# Patient Record
Sex: Female | Born: 1935
Health system: Southern US, Community
[De-identification: ages and names within clinical notes are randomized; demographics above are authoritative.]

## PROBLEM LIST (undated history)

## (undated) DIAGNOSIS — R0989 Other specified symptoms and signs involving the circulatory and respiratory systems: Secondary | ICD-10-CM

## (undated) DIAGNOSIS — K648 Other hemorrhoids: Secondary | ICD-10-CM

## (undated) DIAGNOSIS — D649 Anemia, unspecified: Secondary | ICD-10-CM

## (undated) DIAGNOSIS — I10 Essential (primary) hypertension: Secondary | ICD-10-CM

## (undated) DIAGNOSIS — R0609 Other forms of dyspnea: Secondary | ICD-10-CM

## (undated) DIAGNOSIS — I350 Nonrheumatic aortic (valve) stenosis: Secondary | ICD-10-CM

## (undated) DIAGNOSIS — R112 Nausea with vomiting, unspecified: Secondary | ICD-10-CM

## (undated) DIAGNOSIS — I519 Heart disease, unspecified: Secondary | ICD-10-CM

## (undated) DIAGNOSIS — I6529 Occlusion and stenosis of unspecified carotid artery: Secondary | ICD-10-CM

## (undated) DIAGNOSIS — Z9289 Personal history of other medical treatment: Secondary | ICD-10-CM

## (undated) DIAGNOSIS — Z9889 Other specified postprocedural states: Secondary | ICD-10-CM

## (undated) DIAGNOSIS — R011 Cardiac murmur, unspecified: Secondary | ICD-10-CM

## (undated) DIAGNOSIS — K8020 Calculus of gallbladder without cholecystitis without obstruction: Secondary | ICD-10-CM

## (undated) DIAGNOSIS — I351 Nonrheumatic aortic (valve) insufficiency: Secondary | ICD-10-CM

## (undated) DIAGNOSIS — M159 Polyosteoarthritis, unspecified: Secondary | ICD-10-CM

## (undated) DIAGNOSIS — T8859XA Other complications of anesthesia, initial encounter: Secondary | ICD-10-CM

## (undated) DIAGNOSIS — I739 Peripheral vascular disease, unspecified: Secondary | ICD-10-CM

## (undated) DIAGNOSIS — M542 Cervicalgia: Secondary | ICD-10-CM

## (undated) DIAGNOSIS — T4145XA Adverse effect of unspecified anesthetic, initial encounter: Secondary | ICD-10-CM

## (undated) DIAGNOSIS — R609 Edema, unspecified: Secondary | ICD-10-CM

## (undated) DIAGNOSIS — K219 Gastro-esophageal reflux disease without esophagitis: Secondary | ICD-10-CM

## (undated) DIAGNOSIS — R911 Solitary pulmonary nodule: Secondary | ICD-10-CM

## (undated) HISTORY — PX: HEMORRHOID SURGERY: SHX153

## (undated) HISTORY — DX: Other hemorrhoids: K64.8

## (undated) HISTORY — PX: TOTAL KNEE ARTHROPLASTY: SHX125

## (undated) HISTORY — DX: Peripheral vascular disease, unspecified: I73.9

## (undated) HISTORY — DX: Calculus of gallbladder without cholecystitis without obstruction: K80.20

## (undated) HISTORY — DX: Polyosteoarthritis, unspecified: M15.9

## (undated) HISTORY — PX: ABDOMINAL HYSTERECTOMY: SHX81

## (undated) HISTORY — DX: Essential (primary) hypertension: I10

## (undated) HISTORY — PX: SHOULDER ARTHROSCOPY: SHX128

## (undated) HISTORY — DX: Anemia, unspecified: D64.9

## (undated) HISTORY — DX: Other forms of dyspnea: R06.09

## (undated) HISTORY — PX: HAMMER TOE SURGERY: SHX385

## (undated) HISTORY — PX: OTHER SURGICAL HISTORY: SHX169

## (undated) HISTORY — PX: BUNIONECTOMY: SHX129

## (undated) HISTORY — PX: CHOLECYSTECTOMY: SHX55

## (undated) HISTORY — DX: Other specified symptoms and signs involving the circulatory and respiratory systems: R09.89

## (undated) HISTORY — DX: Edema, unspecified: R60.9

## (undated) HISTORY — DX: Cervicalgia: M54.2

## (undated) HISTORY — PX: CARPAL TUNNEL RELEASE: SHX101

## (undated) HISTORY — DX: Occlusion and stenosis of unspecified carotid artery: I65.29

## (undated) HISTORY — PX: OOPHORECTOMY: SHX86

---
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
## 1999-02-15 ENCOUNTER — Ambulatory Visit (HOSPITAL_COMMUNITY): Admission: RE | Admit: 1999-02-15 | Discharge: 1999-02-15 | Payer: Self-pay | Admitting: General Surgery

## 1999-03-14 ENCOUNTER — Ambulatory Visit (HOSPITAL_COMMUNITY): Admission: RE | Admit: 1999-03-14 | Discharge: 1999-03-14 | Payer: Self-pay | Admitting: Obstetrics and Gynecology

## 1999-07-16 ENCOUNTER — Encounter: Payer: Self-pay | Admitting: Orthopaedic Surgery

## 1999-07-16 ENCOUNTER — Ambulatory Visit (HOSPITAL_COMMUNITY): Admission: RE | Admit: 1999-07-16 | Discharge: 1999-07-16 | Payer: Self-pay | Admitting: Orthopaedic Surgery

## 1999-08-27 ENCOUNTER — Ambulatory Visit (HOSPITAL_COMMUNITY): Admission: RE | Admit: 1999-08-27 | Discharge: 1999-08-27 | Payer: Self-pay | Admitting: Orthopaedic Surgery

## 1999-08-27 ENCOUNTER — Encounter: Payer: Self-pay | Admitting: Orthopaedic Surgery

## 1999-11-20 ENCOUNTER — Ambulatory Visit (HOSPITAL_COMMUNITY): Admission: RE | Admit: 1999-11-20 | Discharge: 1999-11-20 | Payer: Self-pay | Admitting: Obstetrics and Gynecology

## 1999-11-20 ENCOUNTER — Encounter (INDEPENDENT_AMBULATORY_CARE_PROVIDER_SITE_OTHER): Payer: Self-pay

## 2000-01-31 ENCOUNTER — Other Ambulatory Visit: Admission: RE | Admit: 2000-01-31 | Discharge: 2000-01-31 | Payer: Self-pay | Admitting: Gynecology

## 2000-02-21 ENCOUNTER — Encounter: Payer: Self-pay | Admitting: Family Medicine

## 2000-02-21 ENCOUNTER — Encounter: Admission: RE | Admit: 2000-02-21 | Discharge: 2000-02-21 | Payer: Self-pay | Admitting: Family Medicine

## 2000-02-27 ENCOUNTER — Encounter: Payer: Self-pay | Admitting: Family Medicine

## 2000-02-27 ENCOUNTER — Encounter: Admission: RE | Admit: 2000-02-27 | Discharge: 2000-02-27 | Payer: Self-pay | Admitting: Family Medicine

## 2000-03-10 ENCOUNTER — Encounter (INDEPENDENT_AMBULATORY_CARE_PROVIDER_SITE_OTHER): Payer: Self-pay

## 2000-03-10 ENCOUNTER — Inpatient Hospital Stay (HOSPITAL_COMMUNITY): Admission: RE | Admit: 2000-03-10 | Discharge: 2000-03-12 | Payer: Self-pay | Admitting: Gynecology

## 2000-11-03 ENCOUNTER — Other Ambulatory Visit: Admission: RE | Admit: 2000-11-03 | Discharge: 2000-11-03 | Payer: Self-pay | Admitting: Gynecology

## 2000-11-04 ENCOUNTER — Encounter: Admission: RE | Admit: 2000-11-04 | Discharge: 2000-11-04 | Payer: Self-pay | Admitting: Gynecology

## 2000-11-04 ENCOUNTER — Encounter: Payer: Self-pay | Admitting: Gynecology

## 2001-03-04 ENCOUNTER — Encounter: Admission: RE | Admit: 2001-03-04 | Discharge: 2001-03-04 | Payer: Self-pay | Admitting: Gynecology

## 2001-03-04 ENCOUNTER — Encounter: Payer: Self-pay | Admitting: Gynecology

## 2001-11-05 ENCOUNTER — Other Ambulatory Visit: Admission: RE | Admit: 2001-11-05 | Discharge: 2001-11-05 | Payer: Self-pay | Admitting: Gynecology

## 2002-03-09 ENCOUNTER — Encounter: Payer: Self-pay | Admitting: Gynecology

## 2002-03-09 ENCOUNTER — Encounter: Admission: RE | Admit: 2002-03-09 | Discharge: 2002-03-09 | Payer: Self-pay | Admitting: Gynecology

## 2002-11-25 HISTORY — PX: ROTATOR CUFF REPAIR: SHX139

## 2002-12-01 ENCOUNTER — Other Ambulatory Visit: Admission: RE | Admit: 2002-12-01 | Discharge: 2002-12-01 | Payer: Self-pay | Admitting: Gynecology

## 2003-01-07 ENCOUNTER — Encounter: Payer: Self-pay | Admitting: Family Medicine

## 2003-01-07 ENCOUNTER — Encounter: Admission: RE | Admit: 2003-01-07 | Discharge: 2003-01-07 | Payer: Self-pay | Admitting: Family Medicine

## 2003-05-10 ENCOUNTER — Encounter: Payer: Self-pay | Admitting: Gynecology

## 2003-05-10 ENCOUNTER — Encounter: Admission: RE | Admit: 2003-05-10 | Discharge: 2003-05-10 | Payer: Self-pay | Admitting: Gynecology

## 2003-07-13 ENCOUNTER — Encounter: Payer: Self-pay | Admitting: Family Medicine

## 2003-07-13 ENCOUNTER — Encounter: Admission: RE | Admit: 2003-07-13 | Discharge: 2003-07-13 | Payer: Self-pay | Admitting: Family Medicine

## 2003-07-20 ENCOUNTER — Ambulatory Visit (HOSPITAL_BASED_OUTPATIENT_CLINIC_OR_DEPARTMENT_OTHER): Admission: RE | Admit: 2003-07-20 | Discharge: 2003-07-21 | Payer: Self-pay | Admitting: Orthopaedic Surgery

## 2003-11-01 ENCOUNTER — Encounter: Admission: RE | Admit: 2003-11-01 | Discharge: 2003-11-01 | Payer: Self-pay | Admitting: Orthopaedic Surgery

## 2004-11-25 LAB — HM COLONOSCOPY

## 2005-01-03 ENCOUNTER — Other Ambulatory Visit: Admission: RE | Admit: 2005-01-03 | Discharge: 2005-01-03 | Payer: Self-pay | Admitting: Gynecology

## 2005-11-25 HISTORY — PX: OTHER SURGICAL HISTORY: SHX169

## 2006-01-06 ENCOUNTER — Other Ambulatory Visit: Admission: RE | Admit: 2006-01-06 | Discharge: 2006-01-06 | Payer: Self-pay | Admitting: Gynecology

## 2006-02-06 ENCOUNTER — Encounter: Payer: Self-pay | Admitting: Internal Medicine

## 2006-04-01 ENCOUNTER — Ambulatory Visit: Payer: Self-pay | Admitting: *Deleted

## 2006-04-17 ENCOUNTER — Ambulatory Visit: Payer: Self-pay | Admitting: *Deleted

## 2006-04-17 ENCOUNTER — Ambulatory Visit: Payer: Self-pay

## 2006-04-17 ENCOUNTER — Encounter: Payer: Self-pay | Admitting: Internal Medicine

## 2006-04-25 ENCOUNTER — Ambulatory Visit: Payer: Self-pay | Admitting: Internal Medicine

## 2006-06-09 ENCOUNTER — Ambulatory Visit: Payer: Self-pay | Admitting: Internal Medicine

## 2006-06-19 ENCOUNTER — Ambulatory Visit: Payer: Self-pay | Admitting: Internal Medicine

## 2007-01-08 ENCOUNTER — Other Ambulatory Visit: Admission: RE | Admit: 2007-01-08 | Discharge: 2007-01-08 | Payer: Self-pay | Admitting: Gynecology

## 2007-01-12 ENCOUNTER — Ambulatory Visit: Payer: Self-pay | Admitting: Internal Medicine

## 2007-01-14 ENCOUNTER — Ambulatory Visit: Payer: Self-pay

## 2007-07-28 ENCOUNTER — Ambulatory Visit: Payer: Self-pay | Admitting: Internal Medicine

## 2007-10-06 ENCOUNTER — Encounter: Payer: Self-pay | Admitting: Internal Medicine

## 2007-10-06 DIAGNOSIS — I739 Peripheral vascular disease, unspecified: Secondary | ICD-10-CM

## 2007-10-06 DIAGNOSIS — R609 Edema, unspecified: Secondary | ICD-10-CM | POA: Insufficient documentation

## 2007-10-06 HISTORY — DX: Peripheral vascular disease, unspecified: I73.9

## 2007-10-06 HISTORY — DX: Edema, unspecified: R60.9

## 2007-10-19 ENCOUNTER — Ambulatory Visit: Payer: Self-pay | Admitting: Internal Medicine

## 2007-10-19 DIAGNOSIS — I10 Essential (primary) hypertension: Secondary | ICD-10-CM

## 2007-10-19 HISTORY — DX: Essential (primary) hypertension: I10

## 2007-10-29 ENCOUNTER — Telehealth: Payer: Self-pay | Admitting: Internal Medicine

## 2007-11-11 ENCOUNTER — Ambulatory Visit: Payer: Self-pay | Admitting: Internal Medicine

## 2007-11-11 LAB — CONVERTED CEMR LAB
BUN: 19 mg/dL (ref 6–23)
CO2: 28 meq/L (ref 19–32)
Calcium: 8.9 mg/dL (ref 8.4–10.5)
Chloride: 107 meq/L (ref 96–112)
Glucose, Bld: 112 mg/dL — ABNORMAL HIGH (ref 70–99)

## 2007-11-12 ENCOUNTER — Encounter: Payer: Self-pay | Admitting: Internal Medicine

## 2007-11-16 ENCOUNTER — Ambulatory Visit: Payer: Self-pay | Admitting: Internal Medicine

## 2007-11-16 DIAGNOSIS — R0609 Other forms of dyspnea: Secondary | ICD-10-CM

## 2007-11-16 DIAGNOSIS — R0989 Other specified symptoms and signs involving the circulatory and respiratory systems: Secondary | ICD-10-CM

## 2007-11-16 HISTORY — DX: Other specified symptoms and signs involving the circulatory and respiratory systems: R06.09

## 2007-11-16 HISTORY — DX: Other specified symptoms and signs involving the circulatory and respiratory systems: R09.89

## 2007-11-16 LAB — CONVERTED CEMR LAB
Basophils Relative: 0.2 % (ref 0.0–1.0)
HCT: 34.1 % — ABNORMAL LOW (ref 36.0–46.0)
Hemoglobin: 11.7 g/dL — ABNORMAL LOW (ref 12.0–15.0)
Lymphocytes Relative: 17 % (ref 12.0–46.0)
MCHC: 34.3 g/dL (ref 30.0–36.0)
Monocytes Absolute: 0.5 10*3/uL (ref 0.2–0.7)
Monocytes Relative: 7.6 % (ref 3.0–11.0)
Neutro Abs: 4.4 10*3/uL (ref 1.4–7.7)
Neutrophils Relative %: 72.1 % (ref 43.0–77.0)
TSH: 1.2 microintl units/mL (ref 0.35–5.50)

## 2007-12-09 ENCOUNTER — Ambulatory Visit: Payer: Self-pay | Admitting: Internal Medicine

## 2007-12-09 ENCOUNTER — Ambulatory Visit (HOSPITAL_COMMUNITY): Admission: RE | Admit: 2007-12-09 | Discharge: 2007-12-09 | Payer: Self-pay | Admitting: Internal Medicine

## 2007-12-15 ENCOUNTER — Ambulatory Visit: Payer: Self-pay | Admitting: Internal Medicine

## 2007-12-15 ENCOUNTER — Ambulatory Visit (HOSPITAL_COMMUNITY): Admission: RE | Admit: 2007-12-15 | Discharge: 2007-12-15 | Payer: Self-pay | Admitting: Internal Medicine

## 2007-12-15 ENCOUNTER — Encounter: Payer: Self-pay | Admitting: Internal Medicine

## 2007-12-15 ENCOUNTER — Ambulatory Visit: Payer: Self-pay

## 2008-01-11 ENCOUNTER — Encounter: Payer: Self-pay | Admitting: Internal Medicine

## 2008-01-13 ENCOUNTER — Ambulatory Visit: Payer: Self-pay

## 2008-01-13 ENCOUNTER — Ambulatory Visit: Payer: Self-pay | Admitting: Internal Medicine

## 2008-07-08 ENCOUNTER — Encounter: Payer: Self-pay | Admitting: Internal Medicine

## 2008-07-12 ENCOUNTER — Ambulatory Visit: Payer: Self-pay | Admitting: Internal Medicine

## 2008-07-12 DIAGNOSIS — K648 Other hemorrhoids: Secondary | ICD-10-CM | POA: Insufficient documentation

## 2008-08-02 ENCOUNTER — Encounter: Payer: Self-pay | Admitting: Internal Medicine

## 2008-11-11 ENCOUNTER — Ambulatory Visit: Payer: Self-pay | Admitting: Internal Medicine

## 2008-11-11 DIAGNOSIS — M159 Polyosteoarthritis, unspecified: Secondary | ICD-10-CM

## 2008-11-11 HISTORY — DX: Polyosteoarthritis, unspecified: M15.9

## 2008-11-23 ENCOUNTER — Ambulatory Visit: Payer: Self-pay

## 2009-01-10 ENCOUNTER — Ambulatory Visit: Payer: Self-pay

## 2009-01-10 ENCOUNTER — Encounter: Payer: Self-pay | Admitting: Internal Medicine

## 2009-03-17 ENCOUNTER — Encounter: Admission: RE | Admit: 2009-03-17 | Discharge: 2009-03-17 | Payer: Self-pay | Admitting: Orthopaedic Surgery

## 2009-06-02 ENCOUNTER — Ambulatory Visit: Payer: Self-pay | Admitting: Internal Medicine

## 2009-06-02 LAB — CONVERTED CEMR LAB
ALT: 14 units/L (ref 0–35)
AST: 22 units/L (ref 0–37)
Albumin: 4.2 g/dL (ref 3.5–5.2)
Alkaline Phosphatase: 77 units/L (ref 39–117)
BUN: 29 mg/dL — ABNORMAL HIGH (ref 6–23)
Basophils Absolute: 0.1 10*3/uL (ref 0.0–0.1)
Bilirubin, Direct: 0.2 mg/dL (ref 0.0–0.3)
Chloride: 107 meq/L (ref 96–112)
Cholesterol: 158 mg/dL (ref 0–200)
Creatinine, Ser: 0.8 mg/dL (ref 0.4–1.2)
Eosinophils Absolute: 0.4 10*3/uL (ref 0.0–0.7)
Eosinophils Relative: 4.8 % (ref 0.0–5.0)
Glucose, Bld: 102 mg/dL — ABNORMAL HIGH (ref 70–99)
HCT: 30.4 % — ABNORMAL LOW (ref 36.0–46.0)
Lymphs Abs: 1.3 10*3/uL (ref 0.7–4.0)
MCHC: 34.6 g/dL (ref 30.0–36.0)
MCV: 90.5 fL (ref 78.0–100.0)
Monocytes Absolute: 0.7 10*3/uL (ref 0.1–1.0)
Neutrophils Relative %: 67.4 % (ref 43.0–77.0)
Platelets: 221 10*3/uL (ref 150.0–400.0)
RDW: 16.4 % — ABNORMAL HIGH (ref 11.5–14.6)
Total Protein: 6.3 g/dL (ref 6.0–8.3)
Triglycerides: 73 mg/dL (ref 0.0–149.0)
WBC: 7.5 10*3/uL (ref 4.5–10.5)

## 2009-08-03 ENCOUNTER — Encounter: Payer: Self-pay | Admitting: Internal Medicine

## 2009-08-03 LAB — HM MAMMOGRAPHY: HM Mammogram: NORMAL

## 2009-08-22 ENCOUNTER — Ambulatory Visit: Payer: Self-pay | Admitting: Internal Medicine

## 2009-08-22 DIAGNOSIS — D638 Anemia in other chronic diseases classified elsewhere: Secondary | ICD-10-CM

## 2009-08-22 DIAGNOSIS — D649 Anemia, unspecified: Secondary | ICD-10-CM

## 2009-08-22 HISTORY — DX: Anemia, unspecified: D64.9

## 2009-08-22 LAB — CONVERTED CEMR LAB
Folate: >20 ng/mL
Retic Ct Pct: 4.7 % — ABNORMAL HIGH (ref 0.4–3.1)
Saturation Ratios: 38.6 % (ref 20.0–50.0)

## 2009-08-23 ENCOUNTER — Telehealth: Payer: Self-pay | Admitting: Internal Medicine

## 2009-08-28 ENCOUNTER — Ambulatory Visit: Payer: Self-pay | Admitting: Gastroenterology

## 2009-08-29 LAB — CONVERTED CEMR LAB
ALT: 15 units/L (ref 0–35)
Basophils Absolute: 0 10*3/uL (ref 0.0–0.1)
CO2: 29 meq/L (ref 19–32)
Calcium: 9.5 mg/dL (ref 8.4–10.5)
Chloride: 107 meq/L (ref 96–112)
Eosinophils Relative: 3.5 % (ref 0.0–5.0)
GFR calc non Af Amer: 51.4 mL/min (ref >60)
Glucose, Bld: 99 mg/dL (ref 70–99)
HCT: 31.2 % — ABNORMAL LOW (ref 36.0–46.0)
Hemoglobin: 10.9 g/dL — ABNORMAL LOW (ref 12.0–15.0)
IgA: 64 mg/dL — ABNORMAL LOW (ref 68–378)
Lymphocytes Relative: 14.3 % (ref 12.0–46.0)
Lymphs Abs: 1.2 10*3/uL (ref 0.7–4.0)
Monocytes Relative: 9 % (ref 3.0–12.0)
Neutro Abs: 5.9 10*3/uL (ref 1.4–7.7)
RDW: 16 % — ABNORMAL HIGH (ref 11.5–14.6)
Sodium: 140 meq/L (ref 135–145)
Total Protein: 6.9 g/dL (ref 6.0–8.3)
WBC: 8.1 10*3/uL (ref 4.5–10.5)

## 2009-09-05 ENCOUNTER — Encounter: Payer: Self-pay | Admitting: Gastroenterology

## 2009-09-05 ENCOUNTER — Ambulatory Visit: Payer: Self-pay | Admitting: Gastroenterology

## 2009-09-08 ENCOUNTER — Encounter: Payer: Self-pay | Admitting: Gastroenterology

## 2009-10-06 ENCOUNTER — Ambulatory Visit: Payer: Self-pay | Admitting: Internal Medicine

## 2009-10-06 LAB — CONVERTED CEMR LAB
Ketones, urine, test strip: NEGATIVE
Protein, U semiquant: NEGATIVE
Specific Gravity, Urine: <1.005
Urobilinogen, UA: 0.2
pH: 5

## 2009-11-25 HISTORY — PX: JOINT REPLACEMENT: SHX530

## 2009-12-28 ENCOUNTER — Ambulatory Visit: Payer: Self-pay | Admitting: Internal Medicine

## 2009-12-28 DIAGNOSIS — M542 Cervicalgia: Secondary | ICD-10-CM

## 2009-12-28 HISTORY — DX: Cervicalgia: M54.2

## 2009-12-29 ENCOUNTER — Telehealth: Payer: Self-pay | Admitting: Internal Medicine

## 2010-01-09 ENCOUNTER — Encounter: Payer: Self-pay | Admitting: Internal Medicine

## 2010-01-09 ENCOUNTER — Encounter: Admission: RE | Admit: 2010-01-09 | Discharge: 2010-02-15 | Payer: Self-pay | Admitting: Internal Medicine

## 2010-01-16 ENCOUNTER — Encounter: Payer: Self-pay | Admitting: Internal Medicine

## 2010-01-16 ENCOUNTER — Ambulatory Visit: Payer: Self-pay

## 2010-01-16 DIAGNOSIS — I6529 Occlusion and stenosis of unspecified carotid artery: Secondary | ICD-10-CM

## 2010-01-16 HISTORY — DX: Occlusion and stenosis of unspecified carotid artery: I65.29

## 2010-02-05 ENCOUNTER — Telehealth: Payer: Self-pay | Admitting: Internal Medicine

## 2010-02-06 ENCOUNTER — Encounter: Payer: Self-pay | Admitting: Internal Medicine

## 2010-02-15 ENCOUNTER — Encounter: Payer: Self-pay | Admitting: Internal Medicine

## 2010-05-25 ENCOUNTER — Encounter: Admission: RE | Admit: 2010-05-25 | Discharge: 2010-05-25 | Payer: Self-pay | Admitting: Orthopedic Surgery

## 2010-08-07 ENCOUNTER — Encounter: Payer: Self-pay | Admitting: Internal Medicine

## 2010-09-07 ENCOUNTER — Ambulatory Visit: Payer: Self-pay | Admitting: Internal Medicine

## 2010-09-07 ENCOUNTER — Encounter: Payer: Self-pay | Admitting: Internal Medicine

## 2010-09-11 ENCOUNTER — Encounter: Payer: Self-pay | Admitting: Internal Medicine

## 2010-10-17 ENCOUNTER — Telehealth: Payer: Self-pay | Admitting: Internal Medicine

## 2010-10-23 ENCOUNTER — Inpatient Hospital Stay (HOSPITAL_COMMUNITY)
Admission: RE | Admit: 2010-10-23 | Discharge: 2010-10-26 | Payer: Self-pay | Source: Home / Self Care | Admitting: Orthopaedic Surgery

## 2010-12-12 ENCOUNTER — Encounter
Admission: RE | Admit: 2010-12-12 | Discharge: 2010-12-25 | Payer: Self-pay | Source: Home / Self Care | Attending: Orthopaedic Surgery | Admitting: Orthopaedic Surgery

## 2010-12-25 NOTE — Miscellaneous (Signed)
Summary: Orders Update  Clinical Lists Changes  Problems: Added new problem of CAROTID ARTERY STENOSIS (ICD-433.10) Orders: Added new Test order of Carotid Duplex (Carotid Duplex) - Signed 

## 2010-12-25 NOTE — Letter (Signed)
Summary: Medical Clearance/[facility]  Medical Clearance/[facility]   Imported By: Sherian Rein 09/15/2010 11:04:31  _____________________________________________________________________  External Attachment:    Type:   Image     Comment:   External Document

## 2010-12-25 NOTE — Progress Notes (Signed)
  Phone Note Outgoing Call   Reason for Call: Discuss lab or test results Summary of Call: please call patient: x-rays reveal degenerative joint disease and mild to moderate degenerative disk disease. No problem that requires NS consult . PCC's asked to schedule referral to PT -let us know if she has a preference. Continue meloxicam and APAP up to 1000mg  three times a day. For any increased pain or new symptoms, i.e. numbness, tingling, weakness - will refer to NS.  Thanks Initial call taken by: Jacques Navy MD,  December 29, 2009 5:49 AM  Follow-up for Phone Call        pt informed, she did not have a preferance. She states she would call as needed if any new symptoms develope Follow-up by: Ami Bullins CMA,  December 29, 2009 9:23 AM

## 2010-12-25 NOTE — Progress Notes (Signed)
Summary: FYI - SURGERY  Phone Note Call from Patient Call back at Home Phone [phone]   Summary of Call: FYI - Pt is scheduled for knee surgery at [facility] on 11/29th at 10:25.  Initial call taken by: Lamar Sprinkles, CMA,  October 17, 2010 9:53 AM

## 2010-12-25 NOTE — Progress Notes (Signed)
  Phone Note Refill Request Message from:  Fax from Pharmacy  Refills Requested: Medication #1:  CARVEDILOL 12.5 MG TABS 1 by mouth two times a day Initial call taken by: Ami Bullins CMA,  February 05, 2010 1:59 PM    Prescriptions: CARVEDILOL 12.5 MG TABS (CARVEDILOL) 1 by mouth two times a day  #120 x 3   Entered by:   Ami Bullins CMA   Authorized by:   Jacques Navy MD   Signed by:   Bill Salinas CMA on 02/05/2010   Method used:   Electronically to        [facility]  [address] [id number] (ret[facility])       [address]       [place], [place]  [address]       Ph: [phone] or [phone]       Fax: [phone]   RxID:   [id number]

## 2010-12-25 NOTE — Miscellaneous (Signed)
Summary: PT Discharge/[facility]  PT Discharge/[facility]   Imported By: Sherian Rein 02/23/2010 14:40:57  _____________________________________________________________________  External Attachment:    Type:   Image     Comment:   External Document

## 2010-12-25 NOTE — Miscellaneous (Signed)
Summary: [facility]  [facility]   Imported By: Lester Naches 01/13/2010 10:45:48  _____________________________________________________________________  External Attachment:    Type:   Image     Comment:   External Document

## 2010-12-25 NOTE — Assessment & Plan Note (Signed)
Summary: form for surgery/cd   Vital Signs:  Patient profile:   77 year old female Height:      64 inches Weight:      174 pounds BMI:     29.97 O2 Sat:      98 % on Room air Temp:     97.7 degrees F oral Pulse rate:   76 / minute BP sitting:   148 / 58  (left arm) Cuff size:   regular  Vitals Entered By: Bill Salinas CMA (September 07, 2010 2:57 PM)  O2 Flow:  Room air CC: ov for surgical clearance (Right total Knee replacement)/ ab   Primary Care Provider:  Illene Regulus, MD   CC:  ov for surgical clearance (Right total Knee replacement)/ ab.  History of Present Illness: Patient is contemplating right TKR and presents for surgical clearance. She has had no cardiac event or problems for at least six months. Her health has generally been fine. she has had no prior problems with anesthesia She is aware of the risks of surgery as presented by her orthopedist.  Current Medications (verified): 1)  Estrace 0.1 Mg/gm  Crea (Estradiol) .... 2 Times Weekly 2)  Vitamin C 500 Mg  Tabs (Ascorbic Acid) .... Once Daily 3)  Calcium/vitamin D/minerals 600-200 Mg-Unit  Tabs (Calcium Carbonate-Vit D-Min) .[name].. 1 Once Daily 4)  Glucosamine 1500 Complex   Caps (Glucosamine-Chondroit-Vit C-Mn) .[name].. 1 Once Daily 5)  Omeprazole 40 Mg Cpdr (Omeprazole) .[name].. 1 By Mouth Once Daily 6)  Meloxicam 15 Mg Tabs (Meloxicam) .[name].. 1 By Mouth Once Daily 7)  Carvedilol 12.5 Mg Tabs (Carvedilol) .[name].. 1 By Mouth Two Times A Day 8)  Chlorthalidone 25 Mg Tabs (Chlorthalidone) .... One Tablet By Mouth Once Daily  Allergies (verified): 1)  ! Sulfa  Past History:  Past Medical History: Last updated: 08/28/2009 UNSPECIFIED ESSENTIAL HYPERTENSION (ICD-401.9) PERIPHERAL EDEMA (ICD-782.3) PVD (ICD-443.9) hyperplastic colon polyps removed by Dr. Evette Cristal, 2007  Past Surgical History: Last updated: 12/28/2009 Hysterectomy Oophorectomy Rotator cuff repair-left '04 Cleophas Dunker)  Family History: Last updated:  08/28/2009 Mother: ovarian cancer. Father: leukemia and lung cancer.   No family history of premature coronary artery disease.  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  Review of Systems       The patient complains of difficulty walking.  The patient denies anorexia, fever, weight loss, weight gain, chest pain, syncope, dyspnea on exertion, prolonged cough, abdominal pain, severe indigestion/heartburn, muscle weakness, depression, unusual weight change, and abnormal bleeding.    Physical Exam  General:  Overweight white woman in no acute distress Head:  normocephalic and atraumatic.   Eyes:  No corneal or conjunctival inflammation noted. EOMI. Perrla. Funduscopic exam benign, without hemorrhages, exudates or papilledema. Vision grossly normal. Ears:  External ear exam shows no significant lesions or deformities.  Otoscopic examination reveals clear canals, tympanic membranes are intact bilaterally without bulging, retraction, inflammation or discharge. Hearing is grossly normal bilaterally. Mouth:  Oral mucosa and oropharynx without lesions or exudates.  Teeth in good repair. Neck:  supple, full ROM, and no thyromegaly.   Chest Wall:  No deformities, masses, or tenderness noted. Lungs:  normal respiratory effort and normal breath sounds.   Heart:  normal rate, regular rhythm, no murmur, no gallop, and no JVD.   Abdomen:  soft, normal bowel sounds, no guarding, and no hepatomegaly.   Msk:  Right knee  tender with movement. NO other abnormalties noted Pulses:  2+ radial and DP  Neurologic:  alert & oriented X3, cranial nerves II-XII intact, and DTRs symmetrical and normal.   Skin:  turgor normal, color normal, and no suspicious lesions.   Cervical Nodes:  no anterior cervical adenopathy and no posterior cervical adenopathy.   Psych:  Oriented X3, memory intact for  recent and remote, normally interactive, and good eye contact.     Impression & Recommendations:  Problem # 1:  CAROTID ARTERY STENOSIS (ICD-433.10) Last study Feb 22,'11 reveals bilateral ICA stenosis at 40-59%, which is unchanged from study in '10.  No contra-indication for surgery or anesthesia.  Problem # 2:  ANEMIA (ICD-285.9)  The following medications were removed from the medication list:    Ferrous Sulfate 325 (65 Fe) Mg Tabs (Ferrous sulfate) .[name]... 1 by mouth two times a day.  Hgb: 10.9 (08/28/2009)   Hct: 31.2 (08/28/2009)   Platelets: 230.0 (08/28/2009) RBC: 3.43 (08/28/2009)   RDW: 16.0 (08/28/2009)   WBC: 8.1 (08/28/2009) MCV: 90.8 (08/28/2009)   MCHC: 34.8 (08/28/2009) Retic Ct: 163.6 K/uL (08/22/2009)   Iron: 95 (08/22/2009)   % Sat: 38.6 (08/22/2009) B12: 358 (08/22/2009)   Folate: >20.0 ng/mL (08/22/2009)   TSH: 1.06 (06/02/2009)  Stable chronic anemia. No labs ordered - will follow pre-op labs when done.  Problem # 3:  UNSPECIFIED ESSENTIAL HYPERTENSION (ICD-401.9)  Her updated medication list for this problem includes:    Carvedilol 12.5 Mg Tabs (Carvedilol) .[name]... 1 by mouth two times a day    Chlorthalidone 25 Mg Tabs (Chlorthalidone) ..... One tablet by mouth once daily  BP today: 148/58 Prior BP: 118/60 (12/28/2009)  Labs Reviewed: K+: 4.1 (08/28/2009) Creat: : 1.1 (08/28/2009)     Running a little higher today than previous.   Plan - continue present medications.  Problem # 4:  PREOPERATIVE EXAMINATION (ICD-V72.84) Patient with a unremarkable physical exam. 12 Lead EKG without ischemic changes or strain or arrythmia. Reviewed most recent lab which is table with a chronic anemia as noted.   There are no contra-indications or increased medical risks for surgery or anesthesia.  Will be happy to follow the patient while in hospital for medical issues.  Complete Medication List: 1)  Estrace 0.1 Mg/gm Crea (Estradiol) .... 2 times weekly 2)  Vitamin  C 500 Mg Tabs (Ascorbic acid) .... Once daily 3)  Calcium/vitamin D/minerals 600-200 Mg-unit Tabs (Calcium carbonate-vit d-min) .[name].. 1 once daily 4)  Glucosamine 1500 Complex Caps (Glucosamine-chondroit-vit c-mn) .[name].. 1 once daily 5)  Omeprazole 40 Mg Cpdr (Omeprazole) .[name].. 1 by mouth once daily 6)  Meloxicam 15 Mg Tabs (Meloxicam) .[name].. 1 by mouth once daily 7)  Carvedilol 12.5 Mg Tabs (Carvedilol) .[name].. 1 by mouth two times a day 8)  Chlorthalidone 25 Mg Tabs (Chlorthalidone) .... One tablet by mouth once daily   Preventive Care Screening  Bone Density:    Date:  02/06/2010    Results:  abnormal std dev

## 2010-12-25 NOTE — Assessment & Plan Note (Signed)
Summary: neck problem for a long time/#/cd   Vital Signs:  Patient profile:   77 year old female Height:      64 inches Weight:      177 pounds BMI:     30.49 O2 Sat:      97 % on Room air Temp:     98.1 degrees F oral Pulse rate:   67 / minute BP sitting:   118 / 60  (left arm) Cuff size:   regular  Vitals Entered By: Bill Salinas CMA (December 28, 2009 11:17 AM)  O2 Flow:  Room air CC: pt here for evaluation of neck pain, she states she did fall in the ice at the begining of Jan, but has had problems with her neck since the 1980's/ ab   Primary Care Provider:  Illene Regulus, MD   CC:  pt here for evaluation of neck pain, she states she did fall in the ice at the begining of Jan, and but has had problems with her neck since the 1980's/ ab.  History of Present Illness: Having neck pain. Has had a problem since 1988 - she had an MRI, was referred to Dr. Frederic Jericho and was treated with traction. She does have some pain in the proximal right UE. The greatest pain is in the upper thoracic, lower cervical region.  She had a fall on the ice and sustained a laceration to the lower lip that required 5 stitches @ [facility].   Current Medications (verified): 1)  Estrace 0.1 Mg/gm  Crea (Estradiol) .... 2 Times Weekly 2)  Vitamin C 500 Mg  Tabs (Ascorbic Acid) .... Once Daily 3)  Calcium/vitamin D/minerals 600-200 Mg-Unit  Tabs (Calcium Carbonate-Vit D-Min) .[name].. 1 Once Daily 4)  Glucosamine 1500 Complex   Caps (Glucosamine-Chondroit-Vit C-Mn) .[name].. 1 Once Daily 5)  Omeprazole 40 Mg Cpdr (Omeprazole) .[name].. 1 By Mouth Once Daily 6)  Meloxicam 15 Mg Tabs (Meloxicam) .[name].. 1 By Mouth Once Daily 7)  Valium 5 Mg  Tabs (Diazepam) .... At Bedtime As Needed 8)  Carvedilol 12.5 Mg Tabs (Carvedilol) .[name].. 1 By Mouth Two Times A Day 9)  Chlorthalidone 25 Mg Tabs (Chlorthalidone) .... One Tablet By Mouth Once Daily 10)  Ferrous Sulfate 325 (65 Fe) Mg Tabs (Ferrous Sulfate) .[name].. 1 By Mouth  Two Times A Day.  Allergies (verified): 1)  ! Sulfa  Past History:  Past Medical History: Last updated: 08/28/2009 UNSPECIFIED ESSENTIAL HYPERTENSION (ICD-401.9) PERIPHERAL EDEMA (ICD-782.3) PVD (ICD-443.9) hyperplastic colon polyps removed by Dr. Evette Cristal, 2007  Family History: Last updated: 08/28/2009 Mother: ovarian cancer. Father: leukemia and lung cancer.   No family history of premature coronary artery disease.  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  Past Surgical History: Hysterectomy Oophorectomy Rotator cuff repair-left '04 Cleophas Dunker)  Review of Systems  The patient denies anorexia, fever, weight loss, weight gain, vision loss, decreased hearing, hoarseness, chest pain, dyspnea on exertion, prolonged cough, headaches, hemoptysis, abdominal pain, muscle weakness, difficulty walking, unusual weight change, and abnormal bleeding.    Physical Exam  General:  Well-developed,well-nourished,in no acute distress; alert,appropriate and cooperative throughout examination Head:  Normocephalic and atraumatic without obvious abnormalities. No apparent alopecia or balding. Eyes:  pupils equal, pupils round, pupils reactive to light, and pupils react to accomodation.   Mouth:  lower lip with a granuloma that is very firm midline Neck:  normal flexion, decrease extension, decrease rotation worse to the left, decrease in lateral lexion. Lungs:  normal respiratory effort and normal breath sounds.   Heart:  normal rate, regular rhythm, and no murmur.   Msk:  no joint tenderness, no joint swelling, and no joint deformities.   Pulses:  2+ radial Neurologic:  alert & oriented X3, cranial nerves II-XII intact, gait normal, and DTRs symmetrical and normal.   Skin:  turgor normal and color normal.   Psych:  Oriented X3 and good eye contact.     Impression &  Recommendations:  Problem # 1:  NECK PAIN, ACUTE (ICD-723.1) Patient with acute neck pain with a non-radicular exam. Suspect DJD as cause of her discomfort.  Plan - C-spine series.  The following medications were removed from the medication list:    Adult Aspirin Ec Low Strength 81 Mg Tbec (Aspirin) .[name]... 1 once daily Her updated medication list for this problem includes:    Meloxicam 15 Mg Tabs (Meloxicam) .[name]... 1 by mouth once daily  Orders: T-Cervical Spine Comp w/Flex & Ext (16109UE) Physical Therapy Referral (PT)  Addendum - c-spine series with DJD and DDD, mild sublaxation C4-5, osteopenia.  Plan - continue meloxicam           may use APAP 1000mg  three times a day           Refer to physical therapy           no indication for referral to neurosurgery  Complete Medication List: 1)  Estrace 0.1 Mg/gm Crea (Estradiol) .... 2 times weekly 2)  Vitamin C 500 Mg Tabs (Ascorbic acid) .... Once daily 3)  Calcium/vitamin D/minerals 600-200 Mg-unit Tabs (Calcium carbonate-vit d-min) .[name].. 1 once daily 4)  Glucosamine 1500 Complex Caps (Glucosamine-chondroit-vit c-mn) .[name].. 1 once daily 5)  Omeprazole 40 Mg Cpdr (Omeprazole) .[name].. 1 by mouth once daily 6)  Meloxicam 15 Mg Tabs (Meloxicam) .[name].. 1 by mouth once daily 7)  Valium 5 Mg Tabs (Diazepam) .... At bedtime as needed 8)  Carvedilol 12.5 Mg Tabs (Carvedilol) .[name].. 1 by mouth two times a day 9)  Chlorthalidone 25 Mg Tabs (Chlorthalidone) .... One tablet by mouth once daily 10)  Ferrous Sulfate 325 (65 Fe) Mg Tabs (Ferrous sulfate) .[name].. 1 by mouth two times a day.   Preventive Care Screening  Last Flu Shot:    Date:  08/26/2009    Results:  given

## 2010-12-28 ENCOUNTER — Ambulatory Visit: Payer: MEDICARE | Attending: Orthopaedic Surgery | Admitting: Physical Therapy

## 2010-12-28 ENCOUNTER — Encounter: Payer: Self-pay | Admitting: Physical Therapy

## 2010-12-28 DIAGNOSIS — M542 Cervicalgia: Secondary | ICD-10-CM | POA: Insufficient documentation

## 2010-12-28 DIAGNOSIS — M2569 Stiffness of other specified joint, not elsewhere classified: Secondary | ICD-10-CM | POA: Insufficient documentation

## 2010-12-28 DIAGNOSIS — IMO0001 Reserved for inherently not codable concepts without codable children: Secondary | ICD-10-CM | POA: Insufficient documentation

## 2010-12-31 ENCOUNTER — Encounter: Payer: MEDICARE | Admitting: Physical Therapy

## 2010-12-31 ENCOUNTER — Ambulatory Visit: Payer: MEDICARE | Admitting: Physical Therapy

## 2011-01-04 ENCOUNTER — Ambulatory Visit: Payer: MEDICARE | Admitting: Physical Therapy

## 2011-01-05 ENCOUNTER — Encounter: Payer: Self-pay | Admitting: Internal Medicine

## 2011-01-05 DIAGNOSIS — IMO0001 Reserved for inherently not codable concepts without codable children: Secondary | ICD-10-CM

## 2011-01-05 DIAGNOSIS — R269 Unspecified abnormalities of gait and mobility: Secondary | ICD-10-CM

## 2011-01-05 DIAGNOSIS — Z471 Aftercare following joint replacement surgery: Secondary | ICD-10-CM

## 2011-01-07 ENCOUNTER — Encounter: Payer: MEDICARE | Admitting: Physical Therapy

## 2011-01-11 ENCOUNTER — Ambulatory Visit: Payer: MEDICARE | Admitting: Physical Therapy

## 2011-01-14 ENCOUNTER — Ambulatory Visit: Payer: MEDICARE | Admitting: Physical Therapy

## 2011-01-16 ENCOUNTER — Ambulatory Visit: Payer: MEDICARE | Admitting: Physical Therapy

## 2011-01-16 NOTE — Miscellaneous (Signed)
Summary: Care Plan/[facility]  Care Plan/[facility]   Imported By: Sherian Rein 01/09/2011 12:37:37  _____________________________________________________________________  External Attachment:    Type:   Image     Comment:   External Document

## 2011-01-18 ENCOUNTER — Ambulatory Visit: Payer: MEDICARE | Admitting: Physical Therapy

## 2011-01-22 ENCOUNTER — Ambulatory Visit: Payer: MEDICARE | Admitting: Physical Therapy

## 2011-01-25 ENCOUNTER — Encounter: Payer: MEDICARE | Admitting: Physical Therapy

## 2011-02-05 LAB — BASIC METABOLIC PANEL
BUN: 15 mg/dL (ref 6–23)
BUN: 20 mg/dL (ref 6–23)
BUN: 32 mg/dL — ABNORMAL HIGH (ref 6–23)
CO2: 29 mEq/L (ref 19–32)
Calcium: 8.4 mg/dL (ref 8.4–10.5)
Chloride: 104 mEq/L (ref 96–112)
Chloride: 106 mEq/L (ref 96–112)
Chloride: 105 mEq/L (ref 96–112)
Creatinine, Ser: 0.97 mg/dL (ref 0.4–1.2)
Creatinine, Ser: 1.14 mg/dL (ref 0.4–1.2)
GFR calc non Af Amer: 37 mL/min — ABNORMAL LOW (ref >60)
Glucose, Bld: 126 mg/dL — ABNORMAL HIGH (ref 70–99)
Potassium: 4 mEq/L (ref 3.5–5.1)
Sodium: 138 mEq/L (ref 135–145)

## 2011-02-05 LAB — CBC
HCT: 25.9 % — ABNORMAL LOW (ref 36.0–46.0)
Hemoglobin: 8.6 g/dL — ABNORMAL LOW (ref 12.0–15.0)
MCH: 30.1 pg (ref 26.0–34.0)
MCH: 30.3 pg (ref 26.0–34.0)
MCHC: 33.6 g/dL (ref 30.0–36.0)
MCHC: 33.4 g/dL (ref 30.0–36.0)
MCV: 87.9 fL (ref 78.0–100.0)
MCV: 89.5 fL (ref 78.0–100.0)
MCV: 91.5 fL (ref 78.0–100.0)
Platelets: 185 10*3/uL (ref 150–400)
Platelets: 187 10*3/uL (ref 150–400)
Platelets: 240 10*3/uL (ref 150–400)
RBC: 2.86 MIL/uL — ABNORMAL LOW (ref 3.87–5.11)
RBC: 3.43 MIL/uL — ABNORMAL LOW (ref 3.87–5.11)
RDW: 17.7 % — ABNORMAL HIGH (ref 11.5–15.5)
RDW: 17.9 % — ABNORMAL HIGH (ref 11.5–15.5)
RDW: 18.1 % — ABNORMAL HIGH (ref 11.5–15.5)
WBC: 10.9 10*3/uL — ABNORMAL HIGH (ref 4.0–10.5)
WBC: 13.9 10*3/uL — ABNORMAL HIGH (ref 4.0–10.5)

## 2011-02-05 LAB — URINALYSIS, ROUTINE W REFLEX MICROSCOPIC
Nitrite: NEGATIVE
Protein, ur: NEGATIVE mg/dL
Specific Gravity, Urine: 1.013 (ref 1.005–1.030)
Urobilinogen, UA: 0.2 mg/dL (ref 0.0–1.0)

## 2011-02-05 LAB — COMPREHENSIVE METABOLIC PANEL
AST: 18 U/L (ref 0–37)
Albumin: 4.2 g/dL (ref 3.5–5.2)
Calcium: 9.4 mg/dL (ref 8.4–10.5)
Chloride: 106 mEq/L (ref 96–112)
Creatinine, Ser: 1.11 mg/dL (ref 0.4–1.2)
GFR calc Af Amer: 58 mL/min — ABNORMAL LOW (ref >60)

## 2011-02-05 LAB — TYPE AND SCREEN
ABO/RH(D): A POS
Unit division: 0

## 2011-02-05 LAB — SURGICAL PCR SCREEN: MRSA, PCR: NEGATIVE

## 2011-02-05 LAB — DIFFERENTIAL
Eosinophils Relative: 4 % (ref 0–5)
Lymphocytes Relative: 13 % (ref 12–46)
Lymphs Abs: 1.1 10*3/uL (ref 0.7–4.0)
Monocytes Absolute: 0.7 10*3/uL (ref 0.1–1.0)
Monocytes Relative: 8 % (ref 3–12)

## 2011-02-05 LAB — URINE CULTURE: Colony Count: 25000

## 2011-02-05 LAB — ABO/RH: ABO/RH(D): A POS

## 2011-02-05 LAB — APTT: aPTT: 31 seconds (ref 24–37)

## 2011-02-05 LAB — URINE MICROSCOPIC-ADD ON

## 2011-02-11 ENCOUNTER — Other Ambulatory Visit: Payer: Self-pay | Admitting: Gynecology

## 2011-03-12 ENCOUNTER — Other Ambulatory Visit: Payer: Self-pay | Admitting: Internal Medicine

## 2011-03-12 DIAGNOSIS — I70219 Atherosclerosis of native arteries of extremities with intermittent claudication, unspecified extremity: Secondary | ICD-10-CM

## 2011-03-13 ENCOUNTER — Other Ambulatory Visit: Payer: Self-pay | Admitting: *Deleted

## 2011-03-13 ENCOUNTER — Other Ambulatory Visit: Payer: Self-pay | Admitting: Internal Medicine

## 2011-03-13 ENCOUNTER — Encounter (INDEPENDENT_AMBULATORY_CARE_PROVIDER_SITE_OTHER): Payer: Medicare Other | Admitting: *Deleted

## 2011-03-13 DIAGNOSIS — I70219 Atherosclerosis of native arteries of extremities with intermittent claudication, unspecified extremity: Secondary | ICD-10-CM

## 2011-03-13 DIAGNOSIS — I6529 Occlusion and stenosis of unspecified carotid artery: Secondary | ICD-10-CM

## 2011-03-18 ENCOUNTER — Encounter: Payer: Self-pay | Admitting: Internal Medicine

## 2011-03-28 ENCOUNTER — Encounter: Payer: Self-pay | Admitting: Internal Medicine

## 2011-03-29 ENCOUNTER — Ambulatory Visit (INDEPENDENT_AMBULATORY_CARE_PROVIDER_SITE_OTHER): Payer: Medicare Other | Admitting: Internal Medicine

## 2011-03-29 ENCOUNTER — Encounter: Payer: Self-pay | Admitting: Internal Medicine

## 2011-03-29 VITALS — BP 138/62 | HR 75 | Temp 98.0°F | Wt 177.0 lb

## 2011-03-29 DIAGNOSIS — J40 Bronchitis, not specified as acute or chronic: Secondary | ICD-10-CM

## 2011-03-29 MED ORDER — CHLORTHALIDONE 25 MG PO TABS: 25.0000 mg | ORAL_TABLET | Freq: Every day | ORAL | Status: DC

## 2011-03-29 MED ORDER — PROMETHAZINE-CODEINE 6.25-10 MG/5ML PO SYRP: 5.0000 mL | ORAL_SOLUTION | ORAL | Status: AC | PRN

## 2011-03-29 MED ORDER — AZITHROMYCIN 250 MG PO TABS: ORAL_TABLET | ORAL | Status: AC

## 2011-03-29 MED ORDER — MELOXICAM 15 MG PO TABS: 15.0000 mg | ORAL_TABLET | Freq: Every day | ORAL | Status: DC

## 2011-03-29 NOTE — Progress Notes (Signed)
  Subjective:    Patient ID: Claudia Lara, female    DOB: February 16, 1932, 77 y.o.   MRN: [id number]  HPI Claudia Lara presents with a 3 week h/o cough, sputum production of a colored material, wheezing and shortness of breath. She dates this back to a long day in the garden and attributed her symptoms to allergy. Due to the prolong duration of her symptoms she presents for evaluation.   Past Medical History  Diagnosis Date  . ANEMIA 08/22/2009  . Unspecified essential hypertension 10/19/2007  . CAROTID ARTERY STENOSIS 01/16/2010  . PVD 10/06/2007  . HEMORRHOIDS, INTERNAL 07/12/2008  . GEN OSTEOARTHROSIS INVOLVING MULTIPLE SITES 11/11/2008  . NECK PAIN, ACUTE 12/28/2009  . PERIPHERAL EDEMA 10/06/2007  . DYSPNEA ON EXERTION 11/16/2007   Past Surgical History  Procedure Date  . Hyperplastic colon polyps, removed 2007    By Dr. Evette Cristal  . Oophorectomy   . Rotator cuff repair 2004    left (Dr. Cleophas Dunker)   Family History  Problem Relation Age of Onset  . Ovarian cancer Mother   . Cancer Mother   . Leukemia Father   . Lung cancer Father   . Cancer Father   . Diabetes Neg Hx   . Heart disease Neg Hx   . Hypertension Neg Hx    History   Social History  . Marital Status: Divorced    Spouse Name: N/A    Number of Children: N/A  . Years of Education: 12   Occupational History  . Diplomatic Services operational officer     retired   Social History Main Topics  . Smoking status: Never Smoker   . Smokeless tobacco: Not on file  . Alcohol Use: No  . Drug Use: No  . Sexually Active:    Other Topics Concern  . Not on file   Social History Narrative   HSG. Married - divorced for many years.  Retired. Lives alone - very active and independent.       Review of Systems Review of Systems  Constitutional:  Negative for fever, chills, activity change and unexpected weight change.  HENT:  Negative for hearing loss, ear pain,  neck stiffness. Positive congestion and post-nasal drip  Eyes: Negative for pain,  discharge and visual disturbance.  Respiratory: Negative wheezing or DOE.   Cardiovascular: Negative for chest pain and palpitations.       [No decreased exercise tolerance Gastrointestinal: [No change in bowel habit. No bloating or gas. No reflux or indigestion Genitourinary: Negative for urgency, frequency, flank pain and difficulty urinating.  Musculoskeletal: Negative for myalgias, back pain, arthralgias and gait problem.  Neurological: Negative for dizziness, tremors, weakness and headaches.  Hematological: Negative for adenopathy.  Psychiatric/Behavioral: Negative for behavioral problems and dysphoric mood.       Objective:   Physical Exam WNWD heavyset white woman in no distress HEENT - EACs/TMs normal, posterior pharynx clear Chest - coarse rhonchi and tubular breath sounds noted, no wheezing, no increased WOB Cor - RRR        Assessment & Plan:  1. Bronchitis - see patient instructions  Plan - z-pak; phen/cod for cough, supportive care.

## 2011-03-29 NOTE — Patient Instructions (Signed)
Bronchitis- may be viral vs bacterial. With prolonged course antibiotics seems indicated - Z-pak as directed. For cough will use phenergan with codeine. Hydrate. Take vitamin C. If you are a believe - take ecchinacea. Call for persistent cough, fever or shortness of breath.  Bronchitis Bronchitis is the body's way of reacting to injury and/or infection (inflammation) of the bronchi. Bronchi are the air tubes that extend from the windpipe into the lungs. If the inflammation becomes severe, it may cause shortness of breath.  CAUSES Inflammation may be caused by:  A virus.   Germs (bacteria).   Dust.   Allergens.   Pollutants and many other irritants.  The cells lining the bronchial tree are covered with tiny hairs (cilia). These constantly beat upward, away from the lungs, toward the mouth. This keeps the lungs free of pollutants. When these cells become too irritated and are unable to do their job, mucus begins to develop. This causes the characteristic cough of bronchitis. The cough clears the lungs when the cilia are unable to do their job. Without either of these protective mechanisms, the mucus would settle in the lungs. Then you would develop pneumonia. Smoking is a common cause of bronchitis and can contribute to pneumonia. Stopping this habit is the single most important thing you can do to help yourself. TREATMENT  Your caregiver may prescribe an antibiotic if the cough is caused by bacteria. Also, medicines that open up your airways make it easier to breathe. Your caregiver may also recommend or prescribe an expectorant. It will loosen the mucus to be coughed up. Only take over-the-counter or prescription medicines for pain, discomfort, or fever as directed by your caregiver.   Removing whatever causes the problem (smoking, for example) is critical to preventing the problem from getting worse.   Cough suppressants may be prescribed for relief of cough symptoms.   Inhaled medicines  may be prescribed to help with symptoms now and to help prevent problems from returning.   For those with recurrent (chronic) bronchitis, there may be a need for steroid medicines.  SEEK IMMEDIATE MEDICAL CARE IF:  During treatment, you develop more pus-like mucus (purulent sputum).   You or your child has an oral temperature above 100, not controlled by medicine.   Your baby is older than 3 months with a rectal temperature of 102 F (38.9 C) or higher.   Your baby is 71 months old or younger with a rectal temperature of 100.4 F (38 C) or higher.   You become progressively more ill.   You have increased difficulty breathing, wheezing, or shortness of breath.  It is necessary to seek immediate medical care if you are elderly or sick from any other disease. MAKE SURE YOU:  Understand these instructions.   Will watch your condition.   Will get help right away if you are not doing well or get worse.  Document Released: 11/11/2005 Document Re-Released: 02/05/2010 [facility] Patient Information 2011 [place], [place].

## 2011-03-31 ENCOUNTER — Encounter: Payer: Self-pay | Admitting: Internal Medicine

## 2011-04-03 ENCOUNTER — Telehealth: Payer: Self-pay | Admitting: *Deleted

## 2011-04-03 MED ORDER — BENZONATATE 100 MG PO CAPS: 100.0000 mg | ORAL_CAPSULE | Freq: Three times a day (TID) | ORAL | Status: AC | PRN

## 2011-04-03 NOTE — Telephone Encounter (Signed)
Pt continues to c/o persistent cough - otc cough med has not helped. Prometh/cod has given some relief but causes her to be to sleepy so she can not take while working. Cough is still productive but sputum has gone from yellow to white/clear. OK tess pearles per MD, Rx sent in, Patient informed

## 2011-04-05 NOTE — Telephone Encounter (Signed)
Please close document and note you agree with noted verbal order and RX, THANKS

## 2011-04-07 NOTE — Telephone Encounter (Signed)
Agree with note previous

## 2011-04-09 NOTE — Assessment & Plan Note (Signed)
[facility]                            CARDIOLOGY OFFICE NOTE   Claudia Lara, Claudia Lara                       MRN:          [id number]  DATE:01/13/2008                            DOB:          August 04, 1932    PRIMARY CARE PHYSICIAN:  Rosalyn Gess. Norins, MD   INTERVAL HISTORY:  [name] is a 77 year old woman with a history of  hypertension and chronic dyspnea.  She was previously followed by Dr.  Corinda Gubler.  She had an echo and a Myoview back in 2007.  Myoview was  normal with an EF of 70%.  No ST-T wave changes.  There was poor  exercise capacity with only 4 minutes on a Bruce protocol.  Echo showed  mild-to-moderate mitral regurgitation with mildly elevated pulmonary  pressures.   She came to see me for the first time last month, and continued to  complain of a persistent dyspnea.  We repeated her echocardiogram, and  there was normal LV function with just a mild elevation of her PA  pressures.  We set her up with a CPX test and, once again, she has had  poor exercise tolerance.  A PEEK VO2 was 18.9 which was 111% of  predicted.  Her slope was high normal at 33.1, and her RAO was 1.13.  She had a normal O2 pulse.  She had a very rapid heart rate and blood  pressure rise with exercise.  There were positive ST-T wave changes with  stress test.  However, no signs of ischemia elsewhere on the test.  I  have attributed most of her test to deconditioning.   I discussed this with her today, and she says she has been riding her  exercise bike for 3 miles a day, and is active in the yard, and finds it  hard to believe that this could be deconditioning.  She has not been  that active recently.  She recently had a hammer toe fixed.  She denies  any orthopnea, no PND, no chest pain.  She has had chronic swelling of  her right lower extremity.  She is worried about a possible clot.   CURRENT MEDICATIONS:  1. Estrace cream.  2. Aspirin 81.  3. Multivitamin.  4.  Calcium with Vitamin D.  5. Prilosec 20 a day.  6. Chlorthalidone 25 a day.  7. Carvedilol 6.25 b.i.d..   PHYSICAL EXAMINATION:  She is in no acute distress, ambulates around the  clinic without any respiratory difficulty.  Blood pressure 121/62, heart rate 68, weight is 177.  HEENT:  Normal.  NECK:  Supple.  No JVD.  Carotid are 2+ bilaterally without bruits.  There is no lymphadenopathy or thyromegaly.  CARDIAC:  Regular rate and rhythm.  There is a 2/6 systolic ejection  murmur at the right sternal border.  S2 is well preserved.  There is a  soft S4.  LUNGS:  Clear.  ABDOMEN:  Soft, nontender, nondistended, no hepatosplenomegaly, no  bruits, no masses.  Good bowel sounds.  EXTREMITIES:  Warm with no cyanosis or clubbing.  There is 1+ edema on  the right ankle.  No cords, no rash.  NEURO:  Alert and oriented x3.  Cranial nerves II-XII are intact.  Moves  all 4 extremities without difficulty.  Affect is pleasant.   ASSESSMENT AND PLAN:  1. Dyspnea on exertion.  Her CPX test was electrically positive,      otherwise indicative of deconditioning.  She is very concerned      about the possibility of underlying coronary artery disease.  We      discussed the risks and benefits of cardiac catheterization; and      she is __________  her symptoms, she would like to proceed.  We      will do a right-and-left heart catheterization in the outpatient      catheterization lab.  2. Hypertension.  This is well controlled.  3. Lower extremity edema.  I suspect that this is just due to venous      insufficiency, but we are checking an ultrasound to rule out DVT      and other vascular abnormalities.     [name], MD  Electronically Signed    DRB/MedQ  DD: 01/13/2008  DT: 01/14/2008  Job #: [id number]   cc:   Rosalyn Gess. Norins, MD

## 2011-04-09 NOTE — Assessment & Plan Note (Signed)
[facility]                            CARDIOLOGY OFFICE NOTE   Claudia, Lara                       MRN:          [id number]  DATE:12/09/2007                            DOB:          05-13-1932    REASON FOR CONSULTATION:  Dyspnea on exertion.   HISTORY OF PRESENT ILLNESS:  [name] is a very pleasant 77 year old  woman with a history of hypertension.  She denies any history of known  cardiopulmonary disease.  She had an echo and Myoview in 2007 by Dr.  Corinda Gubler.  Myoview showed an EF of 70% with no ST-T wave abnormalities.  She had poor exercise capacity and was only able to exercise for 4  minutes on a Bruce protocol.  She also had an echocardiogram which  showed an EF of 65%.  There was mild-to-moderate mitral regurgitation  and estimated pulmonary pressures were 45 mmHg.  The RV was normal.   Recently, she was started on chlorthalidone for her blood pressure.  She  said this helped a lot with her peripheral edema but did nothing for her  blood pressure.  Diovan was added, and she said this was too powerful  and dropped her blood pressure far too low, and she felt terrible, so  that was stopped and Cartia was added.   She tells me that she gets markedly short of breath and fatigued with  any significant exertion; however, she also says that she is able to  ride her exercise bike for three miles several days a week, although she  gets tired and short of breath if she goes too hard.  In the summer, she  is active in her garden and enjoys this and does not have significant  problems.  She never has chest pressure.  No palpitations.  No  dizziness.  She does take her blood pressure at home.  Her systolics are  in the 150-160 range consistently.   REVIEW OF SYSTEMS:  Notable for some arthritis pain, but otherwise all  systems are negative.  In looking back through her previous notes, it  seems like she does have a history of chronic  dyspnea.   PAST MEDICAL HISTORY:  1. Hypertension.  2. Dyspnea.  3. Postmenopausal.   CURRENT MEDICATIONS:  1. Vivelle Dot.  2. Estrace cream.  3. Aspirin 81.  4. Multivitamin.  5. Vitamin E.  6. Vitamin C.  7. Calcium.  8. Glucosamine.  9. Prilosec.  10.Chlorthalidone 25 a day.  11.Cartia 180 daily.   SOCIAL HISTORY:  She is divorced and single.  She does not drink alcohol  or smoke.   FAMILY HISTORY:  Mother had an ovarian cancer.  Father had leukemia and  lung cancer.  No family history of premature coronary artery disease.   PHYSICAL EXAMINATION:  She is well-appearing in no acute distress.  Ambulates around the clinic without any respiratory difficulty.  Blood pressure 152/60, heart rate 77.  Weight is 178.  HEENT:  Normal.  NECK:  Supple.  No JVD.  Carotids are 2+ bilaterally without any bruits.  There is no lymphadenopathy  or thyromegaly.  CARDIAC:  PMI is nondisplaced.  She has a regular rate and rhythm with a  soft S4.  There is a 2/6 systolic ejection murmur at the right sternal  border.  S2 is well preserved.  She also has a 2/6 tricuspid  regurgitation murmur.  LUNGS:  Clear.  ABDOMEN:  Soft, nontender, nondistended.  No hepatosplenomegaly.  No  bruits.  No masses appreciated.  EXTREMITIES:  Warm with no clubbing or cyanosis.  There is 1+ edema on  the right and trace edema on the left.  No rash.  NEURO:  Alert and oriented x3.  Cranial nerves II-XII are intact.  Moves  all four extremities without difficulty.  Affect is pleasant.   EKG shows normal sinus rhythm at a rate of 77 with normal intervals and  no ST-T wave abnormalities.   ASSESSMENT/PLAN:  1. Dyspnea on exertion:  In reviewing her chart and discussing with      her, I think her major limitation is probably deconditioning.      However, she may have a component of diastolic dysfunction as well.      On her previous echo, her pulmonary pressures were mildly elevated;      thus, I do think it  is reasonable to repeat her echocardiogram to      make sure she is not developing pulmonary hypertension.  She does      not have any clear signs of this on exam.  We will go ahead and get      a cardiopulmonary exercise test to further evaluate as well as a      chest x-ray.  2. Hypertension:  This is poorly controlled.  She tells me she has not      had much benefit from the calcium channel blocker, as this may be      worsening her peripheral edema.  We will stop this and try her on      Coreg 6.25 b.i.d. and see how she does.   DISPOSITION:  We will see her back in a month or so for further  evaluation.     [name], MD  Electronically Signed   DRB/MedQ  DD: 12/09/2007  DT: 12/09/2007  Job #: [id number]

## 2011-04-12 NOTE — Op Note (Signed)
NAMEBIVIANA, Claudia Lara                          ACCOUNT NO.:  [id number]   MEDICAL RECORD NO.:  [id number]                   PATIENT TYPE:  AMB   LOCATION:  DSC                                  FACILITY:  [facility]   PHYSICIAN:  Claude Manges. Cleophas Dunker, M.D.            DATE OF BIRTH:  Jun 25, 1932   DATE OF PROCEDURE:  07/20/2003  DATE OF DISCHARGE:                                 OPERATIVE REPORT   PREOPERATIVE DIAGNOSIS:  Rotator cuff tear, left shoulder with impingement.   POSTOPERATIVE DIAGNOSIS:  Rotator cuff tear, left shoulder with impingement.   OPERATION PERFORMED:  1. Rotator cuff tear repair with supplemental SIS DePuy patch.  2. Arthroscopic subacromial decompression.   SURGEON:  Claude Manges. Cleophas Dunker, M.D.   ANESTHESIA:  General orotracheal anesthesia with interscalene block.   COMPLICATIONS:  None.   INDICATIONS FOR PROCEDURE:  The patient is a 77 year old female who has been  experiencing pain in her left shoulder for months.  She has reached a point  where there has been a compromise despite giving it time and anti-  inflammatory medicines.  Recent MRI scan revealed a full thickness tear of  the supraspinatus tendon with a moderate amount of fluid in the subacromial  and subdeltoid bursa with minimal irregularity of the anterior glenoid  labrum.  I have discussed the options with her and she wishes to proceed  with surgical repair.   DESCRIPTION OF PROCEDURE:  With the patient comfortable on the operating  table and under general orotracheal anesthesia with a supplemental  interscalene block, the patient was placed in a semisitting position with  the shoulder frame.  The left shoulder was then prepped with DuraPrep from  the base of the neck circumferentially to below the elbow.  Sterile draping  was performed.   A marking pen was used to outline the coracoid, the acromion and the  acromioclavicular joint.  It was also used preoperatively, to mark the  appropriate  extremity for surgery.  At a point a fingerbreadth inferior and  medial to the posterior angle of the acromion, a small stab wound was made  prior to which 0.25% Marcaine with epinephrine was injected.  The  arthroscope was easily placed into the shoulder joint.  I saw some small  area of chondromalacia of the humeral head, none of the glenoid.  The  anterior glenoid labrum and biceps tendon were intact.  There were no loose  bodies .   I established a second portal with a spinal needle just to probe the labrum  and the biceps tendon.  They were intact.  The arthroscope was then placed  in the subacromial space posteriorly, the cannula subacromial space  anteriorly and a third portal established for subacromial decompression.  With a 4.2 mm Cuda shaver and the ArthroCare wand, I had a very nice  decompression.  There was obvious overhang of the anterior and lateral  acromion  with a large anterior spur and this was removed with the 6 mm  hooded bur.  There was obvious extensive tearing of the rotator cuff.   A mini open procedure of the rotator cuff tear was performed.  A little over  an inch incision was performed along the anterolateral shoulder,  incorporating the anterior arthroscopic portal.  By sharp dissection, the  incision was carried down to subcutaneous tissue.  An interval within the  deltoid fascia was identified and incised to the level of the bursa.  Self-  retaining retractor was inserted.  I removed further bursal tissue.  I did  evaluate the subacromial decompression and felt I had a very nice  decompression.  The rotator cuff was evaluated.  There was extensive tearing  of both the muscle and the tendon at the supraspinatus area attachment to  the humeral head.  There was a flap of rotator cuff torn and retracted.  This was debrided and then repaired with 0 Ethibond suture.  I thought there  was a lot of fraying and thinning of the remaining cuff in an area of about  an  inch by an inch and accordingly applied a supplemental DePuy SIS patch.  The patch was reconstituted for 10 minutes in saline and then fixed with 2-0  Ethibond.  We got very nice repair under tension.   The wound was then irrigated with saline solution.  There was no evidence of  impingement with range of motion.  The deltoid fascia was closed with a  running 0 Vicryl, the subcu with a 2-0 Vicryl, the skin closed with running  3-0 subcuticular Prolene with Steri-Strips over benzoin.  0.25% Marcaine  with epinephrine was injected into the wound edges.  Sterile bulky dressing  was applied followed by a sling.   PLAN:  Recovery care center.  Discharge in a.m.  Percocet for pain.  Office  one week.                                               Claude Manges. Cleophas Dunker, M.D.    PWW/MEDQ  D:  07/20/2003  T:  07/21/2003  Job:  [id number]

## 2011-04-12 NOTE — Op Note (Signed)
[facility]  Patient:    Claudia Lara                        MRN: [id number] Proc. Date: 11/20/99 Adm. Date:  [id number] Attending:  Amanda Cockayne                           Operative Report  PREOPERATIVE DIAGNOSES:       1. Cervical stenosis.                               2. Previous carcinoma in situ of the endocervix on                                  cold-knife conization six months ago.                               3. Unable to do endocervical Pap smear.                               4. Postmenopausal; hormone replacement therapy.  POSTOPERATIVE DIAGNOSES:      1. Cervical stenosis.                               2. Previous carcinoma in situ of the endocervix on                                  cold-knife conization six months ago.                               3. Unable to do endocervical Pap smear.                               4. Postmenopausal; hormone replacement therapy.  PROCEDURE:  SURGEON:                      Esmeralda Arthur, M.D.  ANESTHESIA:                   General.  PACKS:                        None.  CATHETERS:                    None.  FINDINGS:                     Marked stenosis of the endocervix with difficulty  breaking up the adhesions with the patient asleep.  She was finally dilated to  #29 and ECC was done.  Pap from the exocervix was taken first.  DESCRIPTION OF PROCEDURE:     Patient was carried to the operating room.  She had had a lot of nausea before so she was given antiemetics and IV was started. Patient was then given general anesthesia and she was placed in the lithotomy  position.  The outside of the perineum was prepped.  The patient was then draped in a sterile field.  Examination revealed the uterus to feel midplane and normal size.   A weighted speculum was placed in the vagina and could not visualize the cervix  well.  I then used the duckbill speculum and got the smear  from the outside and  immediately placed it and fixed it.  We then placed her in low Trendelenburg position, put the weighted speculum in and grasped the cervix with a tenaculum.  Then with the hemostat, tried to dilate the outside of the cervix and it took a lot of force to even open the exocervix.  We finally did, and then starting with a small good dilators, progressively dilated her cervix up to a #29.  I then did endocervical curettage with endocervical curette and sent all the tissue we could obtain and blood for evaluation.  The procedure was terminated.  Patient was carried to the recovery room in good  condition. DD:  11/20/99 TD:  11/21/99 Job: [id number]

## 2011-04-12 NOTE — Assessment & Plan Note (Signed)
[facility]                           PRIMARY CARE OFFICE NOTE   Claudia, Lara                       MRN:          [id number]  DATE:01/12/2007                            DOB:          [birth year]    Claudia Lara is a 77 year old Caucasian woman who is followed for general  health care and maintenance. She was last seen June 09, 2006, for  persistent cough. She was seen as a new patient April 25, 2006. Please  see that complete dictation.   The patient recently saw Dr.  Nicholas Lose, and while at his office was found  to have high blood pressure and therefore was referred to see me today.   The patient is concerned about peripheral edema which is a longstanding  problem. She has also has heard from Jessie Foot and Milinda Cave that  Aleve can lead to renal problems and fluid retention.   Last week, the patient did have a life screening approximately 2 years  ago, which revealed mild to moderate left ICA  disease and they have  contacted her to suggest followup.   CURRENT MEDICATIONS:  Vivelle-Dot esterase cream, aspirin 81 mg daily,  Prilosec OTC 20 mg daily.   EXAMINATION:  Temperature 97.7, blood pressure 149/71, pulse was 90,  weight 179.  GENERAL APPEARANCE:  Well-nourished, well-groomed woman in no acute  distress.  EXTREMITIES:  The patient has mild peripheral edema.  No further exam  conducted.   ASSESSMENT/PLAN:  1. Elevated blood pressure. I reviewed the patient's chart to 1995. I      also reviewed the patient's old records from her previous physician      and do not see a pattern of sustained elevated blood pressures. I      suspect she may have a white coat hypertensive type response. Plan:      The patient is asked to do random blood pressure checks over the      next several week. If her blood pressure has systolic's that are      consistently over the 140, diastolic's consistently over 90, we      would consider her a candidate for  medical management.  2. Peripheral edema. We  have reviewed the patient's chart in detail.      She has had normal renal function as recently as July of 2007. She      has normal cardiac function by 2D echo,  Apr 17, 2006. Discussed      the mechanism of venous insufficiency with the patient. Plan:  The      patient is to use supportive stockings. The elevation of her legs      will also help.  3. Peripheral vascular disease. The patient reports she had an      abnormal carotid Doppler by life screening. That study is not      available to me. Plan:  The patient is referred to [facility] for carotid Doppler Wednesday, January 02, 2007, at 3 p.m.      The patient is aware of  this appointment. She will be notified by      [facility] [place] as to the results.   The patient will notify me in regards to blood pressure checks. She  will, otherwise, return to see me for routine follow up as needed.     Claudia Gess Norins, MD  Electronically Signed    MEN/MedQ  DD: 01/12/2007  DT: 01/12/2007  Job #: [id number]   cc:   Gretta Cool, M.D.  Abbott Pao

## 2011-07-17 ENCOUNTER — Ambulatory Visit
Admission: RE | Admit: 2011-07-17 | Discharge: 2011-07-17 | Disposition: A | Discharge status: Home / Self Care | Payer: Medicare Other | Source: Ambulatory Visit | Attending: Orthopaedic Surgery | Admitting: Orthopaedic Surgery

## 2011-07-17 ENCOUNTER — Other Ambulatory Visit: Payer: Self-pay | Admitting: Orthopaedic Surgery

## 2011-07-17 DIAGNOSIS — R52 Pain, unspecified: Secondary | ICD-10-CM

## 2011-07-17 DIAGNOSIS — T148XXA Other injury of unspecified body region, initial encounter: Secondary | ICD-10-CM

## 2011-08-30 ENCOUNTER — Encounter: Payer: Self-pay | Admitting: Internal Medicine

## 2011-10-02 ENCOUNTER — Ambulatory Visit: Payer: Medicare Other | Attending: Orthopaedic Surgery | Admitting: Physical Therapy

## 2011-10-02 DIAGNOSIS — M545 Low back pain, unspecified: Secondary | ICD-10-CM | POA: Insufficient documentation

## 2011-10-02 DIAGNOSIS — IMO0001 Reserved for inherently not codable concepts without codable children: Secondary | ICD-10-CM | POA: Insufficient documentation

## 2011-10-02 DIAGNOSIS — Z96659 Presence of unspecified artificial knee joint: Secondary | ICD-10-CM | POA: Insufficient documentation

## 2011-11-11 ENCOUNTER — Other Ambulatory Visit: Payer: Self-pay | Admitting: Internal Medicine

## 2011-11-18 ENCOUNTER — Ambulatory Visit: Payer: Medicare Other | Admitting: Internal Medicine

## 2011-12-02 ENCOUNTER — Encounter (INDEPENDENT_AMBULATORY_CARE_PROVIDER_SITE_OTHER): Payer: Self-pay

## 2011-12-02 ENCOUNTER — Other Ambulatory Visit (INDEPENDENT_AMBULATORY_CARE_PROVIDER_SITE_OTHER): Payer: Medicare Other

## 2011-12-02 ENCOUNTER — Ambulatory Visit (INDEPENDENT_AMBULATORY_CARE_PROVIDER_SITE_OTHER): Payer: Medicare Other | Admitting: Internal Medicine

## 2011-12-02 DIAGNOSIS — R0609 Other forms of dyspnea: Secondary | ICD-10-CM

## 2011-12-02 DIAGNOSIS — I10 Essential (primary) hypertension: Secondary | ICD-10-CM

## 2011-12-02 DIAGNOSIS — R5383 Other fatigue: Secondary | ICD-10-CM

## 2011-12-02 DIAGNOSIS — R0989 Other specified symptoms and signs involving the circulatory and respiratory systems: Secondary | ICD-10-CM

## 2011-12-02 DIAGNOSIS — D649 Anemia, unspecified: Secondary | ICD-10-CM

## 2011-12-02 DIAGNOSIS — Z79899 Other long term (current) drug therapy: Secondary | ICD-10-CM

## 2011-12-02 LAB — CBC WITH DIFFERENTIAL/PLATELET
Basophils Absolute: 0 10*3/uL (ref 0.0–0.1)
Eosinophils Relative: 5.2 % — ABNORMAL HIGH (ref 0.0–5.0)
HCT: 29.2 % — ABNORMAL LOW (ref 36.0–46.0)
Lymphocytes Relative: 14.3 % (ref 12.0–46.0)
Monocytes Relative: 7.5 % (ref 3.0–12.0)
Neutrophils Relative %: 72.5 % (ref 43.0–77.0)
Platelets: 245 10*3/uL (ref 150.0–400.0)
RDW: 18.2 % — ABNORMAL HIGH (ref 11.5–14.6)
WBC: 7.7 10*3/uL (ref 4.5–10.5)

## 2011-12-02 LAB — HEMOGLOBIN A1C: Hgb A1c MFr Bld: 5.1 % (ref 4.6–6.5)

## 2011-12-02 LAB — TSH: TSH: 1.44 u[IU]/mL (ref 0.35–5.50)

## 2011-12-02 NOTE — Progress Notes (Signed)
Subjective:    Patient ID: Claudia Lara, female    DOB: 1932-07-25, 78 y.o.   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 She can do things but she has decreased stamina. No focal complaints. 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   Chart reviewed: last 2 d echo Jan '09 - EF 60-65%. No abnormality. Saw Dr. Gala Romney - no cardiac disease as cause                           Anemia to Hgb 8.6 - saw Dr. Elsie Saas - EGD Oct '10 with gastritis                           Carotid dopplers - remain stable as of April '12.                           Lab - over the past two years, with last labs in Dec '11 - normal blood sugar, TSH, chemistries.                           CT abdomen Aug 22nd, '12 - negative for mass, lympadenopathy                           PAP smear - normal March 19, '12                           Colonoscopy Jan 1, '06 - hyperplastic polyps   Past Medical History  Diagnosis Date  . ANEMIA 08/22/2009  . Unspecified essential hypertension 10/19/2007  . CAROTID ARTERY STENOSIS 01/16/2010  . PVD 10/06/2007  . HEMORRHOIDS, INTERNAL 07/12/2008  . GEN OSTEOARTHROSIS INVOLVING MULTIPLE SITES 11/11/2008  . NECK PAIN, ACUTE 12/28/2009  . PERIPHERAL EDEMA 10/06/2007  . DYSPNEA ON EXERTION 11/16/2007   Past Surgical History  Procedure Date  . Hyperplastic colon polyps, removed 2007    By Dr. Evette Cristal  . Oophorectomy   . Rotator cuff repair 2004    left (Dr. Cleophas Dunker)   Family History  Problem Relation Age of Onset  . Ovarian cancer Mother   . Cancer Mother   . Leukemia Father   . Lung cancer Father   . Cancer Father   . Diabetes Neg Hx   . Heart disease Neg Hx   . Hypertension Neg Hx    History   Social History  . Marital Status: Divorced    Spouse Name: N/A    Number of Children: N/A  . Years of Education: 12   Occupational History  . Diplomatic Services operational officer     retired   Social History Main Topics  . Smoking  status: Never Smoker   . Smokeless tobacco: Not on file  . Alcohol Use: No  . Drug Use: No  . Sexually Active:    Other Topics Concern  . Not on file   Social History Narrative   HSG. Married - divorced for many years.  Retired. Lives alone - very active and independent.       Review of Systems Constitutional:  Negative for fever, chills, activity change and unexpected weight change.  HEENT:  Negative for hearing loss, ear pain, congestion, neck  stiffness and postnasal drip. Negative for sore throat or swallowing problems. Negative for dental complaints.   Eyes: Negative for vision loss or change in visual acuity.  Respiratory: Negative for chest tightness and wheezing. Negative for DOE.   Cardiovascular: Negative for chest pain or palpitations. No decreased exercise tolerance Gastrointestinal: No change in bowel habit. No bloating or gas. No reflux or indigestion Genitourinary: Negative for urgency, frequency, flank pain and difficulty urinating.  Musculoskeletal: Negative for myalgias, back pain, arthralgias and gait problem.  Neurological: Negative for dizziness, tremors, weakness and headaches.  Hematological: Negative for adenopathy.  Psychiatric/Behavioral: Negative for behavioral problems and dysphoric mood.       Objective:   Physical Exam Filed Vitals:   12/02/11 1048  BP: 138/72  Pulse: 70  Temp: 98.4 F (36.9 C)  Gen'l- WNWD nicely groomed white woman in no distress HEENT - C&S clear Neck- supple, no thyromegaly or thyroid tenderness Nodes - negative submandibular, cervical, supraclavicular Pulm - normal respirations, no rales or wheezes Cor- 2+ radial pulse, no JVD, no carotid bruits, RRR w/o M/R/G Abd - BS+, soft, no guarding or rebound Extremity - ankle swollen, decrease ROM, no small joint erythema or synovial thickening.  Lab Results  Component Value Date   WBC 7.7 12/02/2011   HGB 10.3* 12/02/2011   HCT 29.2* 12/02/2011   PLT 245.0 12/02/2011   GLUCOSE  92 12/02/2011   CHOL 158 06/02/2009   TRIG 73.0 06/02/2009   HDL 52.10 06/02/2009   LDLCALC 91 06/02/2009   ALT 11 12/02/2011   AST 19 12/02/2011   NA 143 12/02/2011   K 4.6 12/02/2011   CL 109 12/02/2011   CREATININE 1.1 12/02/2011   BUN 28* 12/02/2011   CO2 27 12/02/2011   TSH 1.44 12/02/2011   INR 1.06 10/19/2010   HGBA1C 5.1 12/02/2011       FT4                        0.86 (0.6-1.6)      FT3                        3.1 (2.3-4.2)      Total protein          6.6 (normal)      B12                        449 (normal)        Assessment & Plan:

## 2011-12-03 LAB — COMPREHENSIVE METABOLIC PANEL
ALT: 17 U/L (ref 0–35)
Albumin: 4.3 g/dL (ref 3.5–5.2)
CO2: 27 mEq/L (ref 19–32)
Chloride: 109 mEq/L (ref 96–112)
GFR: 51.64 mL/min — ABNORMAL LOW (ref >60.00)
Glucose, Bld: 92 mg/dL (ref 70–99)
Potassium: 4.6 mEq/L (ref 3.5–5.1)
Sodium: 143 mEq/L (ref 135–145)
Total Protein: 6.6 g/dL (ref 6.0–8.3)

## 2011-12-03 NOTE — Assessment & Plan Note (Signed)
Patient with persistent decrease in exercise tolerance described by her as out of energy. Physical exam and labs negative. Chart review - negative. Cardiology consult negative. NO evidence of infection, significant anemia or thyroid disease. No evidence to suggest malignancy.  Plan - Check with GI about repeat colonoscopy            Good diet, exercise and appropriate rest.

## 2011-12-09 ENCOUNTER — Encounter: Payer: Self-pay | Admitting: Internal Medicine

## 2012-02-12 ENCOUNTER — Other Ambulatory Visit: Payer: Self-pay | Admitting: Gynecology

## 2012-03-27 ENCOUNTER — Other Ambulatory Visit: Payer: Self-pay | Admitting: *Deleted

## 2012-03-27 DIAGNOSIS — I6529 Occlusion and stenosis of unspecified carotid artery: Secondary | ICD-10-CM

## 2012-03-31 ENCOUNTER — Encounter (INDEPENDENT_AMBULATORY_CARE_PROVIDER_SITE_OTHER): Payer: Medicare Other

## 2012-03-31 DIAGNOSIS — I6529 Occlusion and stenosis of unspecified carotid artery: Secondary | ICD-10-CM

## 2012-04-06 ENCOUNTER — Telehealth: Payer: Self-pay | Admitting: *Deleted

## 2012-04-06 NOTE — Telephone Encounter (Signed)
Message left on voice mail of patient home # in system of normal results.

## 2012-04-06 NOTE — Telephone Encounter (Signed)
Message copied by Elnora Morrison on Mon Apr 06, 2012 10:52 AM ------      Message from: Illene Regulus E      Created: Thu Apr 02, 2012  9:53 AM       Please call patient: carotid doppler is stable with no change from previous study. Recheck in 1 year.

## 2012-04-30 ENCOUNTER — Other Ambulatory Visit: Payer: Self-pay | Admitting: Internal Medicine

## 2012-06-11 ENCOUNTER — Other Ambulatory Visit: Payer: Self-pay | Admitting: Emergency Medicine

## 2012-07-16 ENCOUNTER — Other Ambulatory Visit (INDEPENDENT_AMBULATORY_CARE_PROVIDER_SITE_OTHER): Payer: Medicare Other

## 2012-07-16 ENCOUNTER — Encounter: Payer: Self-pay | Admitting: Internal Medicine

## 2012-07-16 ENCOUNTER — Ambulatory Visit (INDEPENDENT_AMBULATORY_CARE_PROVIDER_SITE_OTHER): Payer: Medicare Other | Admitting: Internal Medicine

## 2012-07-16 VITALS — BP 150/68 | HR 80 | Temp 97.8°F | Resp 16 | Wt 178.0 lb

## 2012-07-16 DIAGNOSIS — I8390 Asymptomatic varicose veins of unspecified lower extremity: Secondary | ICD-10-CM

## 2012-07-16 DIAGNOSIS — Z0000 Encounter for general adult medical examination without abnormal findings: Secondary | ICD-10-CM

## 2012-07-16 DIAGNOSIS — D649 Anemia, unspecified: Secondary | ICD-10-CM

## 2012-07-16 DIAGNOSIS — I10 Essential (primary) hypertension: Secondary | ICD-10-CM

## 2012-07-16 DIAGNOSIS — Z23 Encounter for immunization: Secondary | ICD-10-CM

## 2012-07-16 DIAGNOSIS — R202 Paresthesia of skin: Secondary | ICD-10-CM

## 2012-07-16 DIAGNOSIS — R209 Unspecified disturbances of skin sensation: Secondary | ICD-10-CM

## 2012-07-16 DIAGNOSIS — R609 Edema, unspecified: Secondary | ICD-10-CM

## 2012-07-16 LAB — LIPID PANEL
Cholesterol: 141 mg/dL (ref 0–200)
HDL: 52 mg/dL (ref >39.00)
LDL Cholesterol: 75 mg/dL (ref 0–99)
Total CHOL/HDL Ratio: 3
Triglycerides: 71 mg/dL (ref 0.0–149.0)

## 2012-07-16 LAB — CBC WITH DIFFERENTIAL/PLATELET
Basophils Relative: 0.6 % (ref 0.0–3.0)
HCT: 29.1 % — ABNORMAL LOW (ref 36.0–46.0)
Hemoglobin: 9.9 g/dL — ABNORMAL LOW (ref 12.0–15.0)
Lymphocytes Relative: 11.8 % — ABNORMAL LOW (ref 12.0–46.0)
Lymphs Abs: 0.8 10*3/uL (ref 0.7–4.0)
Monocytes Relative: 7.6 % (ref 3.0–12.0)
Neutro Abs: 5.1 10*3/uL (ref 1.4–7.7)
RBC: 3.2 Mil/uL — ABNORMAL LOW (ref 3.87–5.11)

## 2012-07-16 LAB — COMPREHENSIVE METABOLIC PANEL
ALT: 15 U/L (ref 0–35)
AST: 20 U/L (ref 0–37)
BUN: 27 mg/dL — ABNORMAL HIGH (ref 6–23)
CO2: 25 mEq/L (ref 19–32)
Calcium: 8.8 mg/dL (ref 8.4–10.5)
Chloride: 106 mEq/L (ref 96–112)
Creatinine, Ser: 1.1 mg/dL (ref 0.4–1.2)
GFR: 52.11 mL/min — ABNORMAL LOW (ref >60.00)
Total Bilirubin: 1.5 mg/dL — ABNORMAL HIGH (ref 0.3–1.2)

## 2012-07-16 MED ORDER — CHLORTHALIDONE 25 MG PO TABS: 25.0000 mg | ORAL_TABLET | Freq: Every day | ORAL | Status: DC

## 2012-07-16 MED ORDER — CARVEDILOL 12.5 MG PO TABS: 12.5000 mg | ORAL_TABLET | Freq: Two times a day (BID) | ORAL | Status: DC

## 2012-07-16 NOTE — Assessment & Plan Note (Signed)
BP Readings from Last 3 Encounters:  07/16/12 150/68  12/02/11 138/72  03/29/11 138/62   Reviewed home readings: only one reading with SBP 150+ remained (over 12) normal  Plan Continue present meds  Bmet today.

## 2012-07-16 NOTE — Assessment & Plan Note (Signed)
Minimal edema at today's visit.   Plan - continue chlorthalidone for BP  Elevate legs or use support hose

## 2012-07-16 NOTE — Assessment & Plan Note (Signed)
Follow up CBC with recommendations to follow.

## 2012-07-16 NOTE — Progress Notes (Signed)
Subjective:    Patient ID: Claudia Lara, female    DOB: 05-06-1932, 78 y.o.   MRN: [id number]  HPI Claudia Lara is concerned about raised areas both calves. She is also having minor tingling in her legs. She denies weakness, symptoms of claudication, pain. She does have mild pruritis of the lower legs.  [employer] has recommended lipid panel - chart reviewed and last panel in '10 was normal. Last general chemistries in January were normal  She is due tetanus. She had pneumonia after age 60 at her previous doctor's. She thinks she had shingles vaccine but no record found going back to 2007.  She is otherwise feeling well and doing well.   Past Medical History  Diagnosis Date  . ANEMIA 08/22/2009  . Unspecified essential hypertension 10/19/2007  . CAROTID ARTERY STENOSIS 01/16/2010  . PVD 10/06/2007  . HEMORRHOIDS, INTERNAL 07/12/2008  . GEN OSTEOARTHROSIS INVOLVING MULTIPLE SITES 11/11/2008  . NECK PAIN, ACUTE 12/28/2009  . PERIPHERAL EDEMA 10/06/2007  . DYSPNEA ON EXERTION 11/16/2007   Past Surgical History  Procedure Date  . Hyperplastic colon polyps, removed 2007    By Dr. Evette Cristal  . Oophorectomy   . Rotator cuff repair 2004    left (Dr. Cleophas Dunker)   Family History  Problem Relation Age of Onset  . Ovarian cancer Mother   . Cancer Mother   . Leukemia Father   . Lung cancer Father   . Cancer Father   . Diabetes Neg Hx   . Heart disease Neg Hx   . Hypertension Neg Hx    History   Social History  . Marital Status: Divorced    Spouse Name: N/A    Number of Children: N/A  . Years of Education: 12   Occupational History  . Diplomatic Services operational officer     retired   Social History Main Topics  . Smoking status: Never Smoker   . Smokeless tobacco: Not on file  . Alcohol Use: No  . Drug Use: No  . Sexually Active:    Other Topics Concern  . Not on file   Social History Narrative   HSG. Married - divorced for many years.  Retired. Lives alone - very active and independent.     Current Outpatient Prescriptions on File Prior to Visit  Medication Sig Dispense Refill  . Ascorbic Acid (VITAMIN C) 500 MG tablet Take 500 mg by mouth daily.        . Calcium Carbonate-Vit D-Min (CALCIUM 600 + MINERALS) 600-200 MG-UNIT TABS Take by mouth daily.        . carvedilol (COREG) 12.5 MG tablet TAKE 1 TABLET TWICE A DAY  120 tablet  5  . chlorthalidone (HYGROTON) 25 MG tablet Take 1 tablet (25 mg total) by mouth daily. Take 1 by mouth daily  90 tablet  3  . estradiol (ESTRACE) 1 MG tablet Take 1 mg by mouth 2 (two) times daily.        . Glucosamine-Chondroit-Vit C-Mn (GLUCOSAMINE 1500 COMPLEX) CAPS Take by mouth daily.        . meloxicam (MOBIC) 15 MG tablet TAKE 1 TABLET (15 MG TOTAL) BY MOUTH DAILY.  90 tablet  3  . naproxen sodium (ANAPROX) 220 MG tablet Take 220 mg by mouth 2 (two) times daily with a meal.        . omeprazole (PRILOSEC) 40 MG capsule Take 40 mg by mouth daily.            Review of Systems System  review is negative for any constitutional, cardiac, pulmonary, GI or neuro symptoms or complaints other than as described in the HPI.     Objective:   Physical Exam Filed Vitals:   07/16/12 0928  BP: 150/68  Pulse: 80  Temp: 97.8 F (36.6 C)  Resp: 16   Gen'l- overweihgt white woman in no distress HEENT C&S clear Cor - 2+ radial pulse, regular Pulm - normal respirations Derm - brawny skin changes distal LE, visible enlared veins both calves, no palpable lesions Neuro - A&O x 3, normal gait.       Assessment & Plan:  Varicose veins - patient reassured that there are no lesions.  Leg tingling - chart reviewed: normal B12, Thyroid functions and A1C in January '13. Plan- watchful waiting - for worsening symptoms will consider NCS

## 2012-07-16 NOTE — Patient Instructions (Addendum)
Raised areas on the legs looks to be enlarged superficial veins (varicose) without signs of inflammation. No sign of subcutaneous problems.  Leg tingling - B12 was normal in January '13 as was thyroid function. All other labs were also normal. Plan at this time is watchful waiting.  Itching - no visible rash. Recommend using moisturizing lotion of choice and for itching either over the counter benadry (sedating) or loratadine (non-sedating) antihistamines.  Shingles vaccine - going back to '07 no documentation of shingles vaccine administration found. Please check with [employer] about coverage first then we are happy to provide the immunization.   Blood pressure - good home readings. Continue your present medications.

## 2012-07-19 ENCOUNTER — Encounter: Payer: Self-pay | Admitting: Internal Medicine

## 2012-07-20 ENCOUNTER — Telehealth: Payer: Self-pay | Admitting: Medical Oncology

## 2012-07-20 NOTE — Telephone Encounter (Signed)
Error

## 2012-08-21 ENCOUNTER — Encounter: Payer: Self-pay | Admitting: Internal Medicine

## 2012-12-09 ENCOUNTER — Ambulatory Visit (INDEPENDENT_AMBULATORY_CARE_PROVIDER_SITE_OTHER): Payer: Medicare Other | Admitting: Internal Medicine

## 2012-12-09 ENCOUNTER — Encounter: Payer: Self-pay | Admitting: Internal Medicine

## 2012-12-09 VITALS — BP 130/62 | HR 80 | Temp 97.7°F | Resp 10 | Wt 177.1 lb

## 2012-12-09 DIAGNOSIS — M940 Chondrocostal junction syndrome [Tietze]: Secondary | ICD-10-CM

## 2012-12-09 NOTE — Progress Notes (Signed)
Subjective:    Patient ID: Claudia Lara, female    DOB: [birth year], 79 y.o.   MRN: [id number]  HPI Mrs. Bring has been having intermittent pain in the left chest. In December she had a mammogram and had a recall for further views of the left breast. Report reviewed. These studies were benign. Since she has had intermittent pain in the upper left breast and occasionally below the breast. Duration is variable in duration, severity is about 5. During episodes of pain there is no tenderness to touch. No change in exercise tolerance. She admits to associated SOB, but no diaphoresis. There is no radiation of pain. No recall of any injury.  Past Medical History  Diagnosis Date  . ANEMIA 08/22/2009  . Unspecified essential hypertension 10/19/2007  . CAROTID ARTERY STENOSIS 01/16/2010  . PVD 10/06/2007  . HEMORRHOIDS, INTERNAL 07/12/2008  . GEN OSTEOARTHROSIS INVOLVING MULTIPLE SITES 11/11/2008  . NECK PAIN, ACUTE 12/28/2009  . PERIPHERAL EDEMA 10/06/2007  . DYSPNEA ON EXERTION 11/16/2007   Past Surgical History  Procedure Date  . Hyperplastic colon polyps, removed 2007    By Dr. Evette Cristal  . Oophorectomy   . Rotator cuff repair 2004    left (Dr. Cleophas Dunker)   Family History  Problem Relation Age of Onset  . Ovarian cancer Mother   . Cancer Mother   . Leukemia Father   . Lung cancer Father   . Cancer Father   . Diabetes Neg Hx   . Heart disease Neg Hx   . Hypertension Neg Hx    History   Social History  . Marital Status: Divorced    Spouse Name: N/A    Number of Children: N/A  . Years of Education: 12   Occupational History  . Diplomatic Services operational officer     retired   Social History Main Topics  . Smoking status: Never Smoker   . Smokeless tobacco: Not on file  . Alcohol Use: No  . Drug Use: No  . Sexually Active:    Other Topics Concern  . Not on file   Social History Narrative   HSG. Married - divorced for many years.  Retired. Lives alone - very active and independent.    Current  Outpatient Prescriptions on File Prior to Visit  Medication Sig Dispense Refill  . Ascorbic Acid (VITAMIN C) 500 MG tablet Take 500 mg by mouth daily.        . Calcium Carbonate-Vit D-Min (CALCIUM 600 + MINERALS) 600-200 MG-UNIT TABS Take by mouth daily.        . carvedilol (COREG) 12.5 MG tablet Take 1 tablet (12.5 mg total) by mouth 2 (two) times daily with a meal.  360 tablet  3  . Glucosamine-Chondroit-Vit C-Mn (GLUCOSAMINE 1500 COMPLEX) CAPS Take by mouth daily.        . meloxicam (MOBIC) 15 MG tablet TAKE 1 TABLET (15 MG TOTAL) BY MOUTH DAILY.  90 tablet  3  . omeprazole (PRILOSEC) 40 MG capsule Take 40 mg by mouth daily.        . chlorthalidone (HYGROTON) 25 MG tablet Take 1 tablet (25 mg total) by mouth daily. Take 1 by mouth daily  90 tablet  3       Review of Systems System review is negative for any constitutional, cardiac, pulmonary, GI or neuro symptoms or complaints other than as described in the HPI.     Objective:   Physical Exam Filed Vitals:   12/09/12 1302  BP: 130/62  Pulse: 80  Temp: 97.7 F (36.5 C)  Resp: 10   Gen'l- WNWD white woman in no distress Cor- RRR Pulm - normal respirations Abd - BS+ , soft, no tenderness Chest wall very tender at the costo-sternal joint R6        Assessment & Plan:  Costochondritis left rib-sternum.  Plan -  Rub or choice at this point: icy-hot, aspercreme, etc.  For unrelieved pain we can do a steroid injection - it doesn't hurt that much.

## 2012-12-09 NOTE — Patient Instructions (Addendum)
Costochondritis left rib-sternum.  Plan -  Rub or choice at this point: icy-hot, aspercreme, etc.  For unrelieved pain we can do a steroid injection - it doesn't hurt that much.  Costochondritis Costochondritis (Tietze syndrome), or costochondral separation, is a swelling and irritation (inflammation) of the tissue (cartilage) that connects your ribs with your breastbone (sternum). It may occur on its own (spontaneously), through damage caused by an accident (trauma), or simply from coughing or minor exercise. It may take up to 6 weeks to get better and longer if you are unable to be conservative in your activities. HOME CARE INSTRUCTIONS    Avoid exhausting physical activity. Try not to strain your ribs during normal activity. This would include any activities using chest, belly (abdominal), and side muscles, especially if heavy weights are used.   Use ice for 15 to 20 minutes per hour while awake for the first 2 days. Place the ice in a plastic bag, and place a towel between the bag of ice and your skin.   Only take over-the-counter or prescription medicines for pain, discomfort, or fever as directed by your caregiver.  SEEK IMMEDIATE MEDICAL CARE IF:    Your pain increases or you are very uncomfortable.   You have a fever.   You develop difficulty with your breathing.   You cough up blood.   You develop worse chest pains, shortness of breath, sweating, or vomiting.   You develop new, unexplained problems (symptoms).  MAKE SURE YOU:    Understand these instructions.   Will watch your condition.   Will get help right away if you are not doing well or get worse.  Document Released: 08/21/2005 Document Revised: 02/03/2012 Document Reviewed: 06/29/2008 [facility] Patient Information 2013 [place], [place].

## 2013-04-20 ENCOUNTER — Encounter (INDEPENDENT_AMBULATORY_CARE_PROVIDER_SITE_OTHER): Payer: Medicare Other

## 2013-04-20 DIAGNOSIS — I6529 Occlusion and stenosis of unspecified carotid artery: Secondary | ICD-10-CM

## 2013-05-10 ENCOUNTER — Encounter: Payer: Self-pay | Admitting: Internal Medicine

## 2013-05-10 ENCOUNTER — Ambulatory Visit (INDEPENDENT_AMBULATORY_CARE_PROVIDER_SITE_OTHER): Payer: Medicare Other | Admitting: Internal Medicine

## 2013-05-10 VITALS — BP 160/66 | HR 65 | Temp 97.0°F | Ht 64.5 in | Wt 172.0 lb

## 2013-05-10 DIAGNOSIS — R279 Unspecified lack of coordination: Secondary | ICD-10-CM

## 2013-05-10 DIAGNOSIS — I10 Essential (primary) hypertension: Secondary | ICD-10-CM

## 2013-05-10 DIAGNOSIS — R270 Ataxia, unspecified: Secondary | ICD-10-CM

## 2013-05-10 MED ORDER — FUROSEMIDE 20 MG PO TABS: 20.0000 mg | ORAL_TABLET | Freq: Every day | ORAL | Status: DC

## 2013-05-10 MED ORDER — MELOXICAM 15 MG PO TABS: ORAL_TABLET | ORAL | Status: DC

## 2013-05-10 NOTE — Patient Instructions (Addendum)
1. Blood pressure -  BP Readings from Last 3 Encounters:  05/10/13 160/66  12/09/12 130/62  07/16/12 150/68   Too high on just carvedilol alone.   Plan A trial of a different diuretic, drug class of choice for isolated systolic hypertension - lasix 20 mg once day.  2. Balance problems - on exam you could not tandem gait, were off balance at station with eyes closed and had subtle decreased coordination of the left hand. These signs suggest that there may have been a tiny bit of damage to the cerebellum, the small hind brain, or brainstem affecting balance.  Plan Turn the light on when you get up at night  Start aspirin 81 mg daily  MRI scan of the brain to look for any injury.

## 2013-05-10 NOTE — Progress Notes (Signed)
  Subjective:    Patient ID: Claudia Lara, female    DOB: 14-Feb-1932, 79 y.o.   MRN: [id number]  HPI Claudia Lara presents for a problem with Blood pressure. She stopped chlorthalidone due to lethargy and fatigue which cleared when she stopped it. Her blood pressure has been a little high since.  She reports that she will have loss of balance when she gets up at night to micturate. She denies true vertigo. She has some trouble with eye focus when she returns to bed. This does not interfere with her being able to return to sleep. She denies any associated symptom: paresthesia face  Or extremities, no double vision. Denies any cardiac symptoms: no chest pain, palpitations.   PMH, FamHx and SocHx reviewed for any changes and relevance.  Current Outpatient Prescriptions on File Prior to Visit  Medication Sig Dispense Refill  . Ascorbic Acid (VITAMIN C) 500 MG tablet Take 500 mg by mouth daily.        . Calcium Carbonate-Vit D-Min (CALCIUM 600 + MINERALS) 600-200 MG-UNIT TABS Take by mouth daily.        . carvedilol (COREG) 12.5 MG tablet Take 1 tablet (12.5 mg total) by mouth 2 (two) times daily with a meal.  360 tablet  3  . Glucosamine-Chondroit-Vit C-Mn (GLUCOSAMINE 1500 COMPLEX) CAPS Take by mouth daily.        . meloxicam (MOBIC) 15 MG tablet TAKE 1 TABLET (15 MG TOTAL) BY MOUTH DAILY.  90 tablet  3  . omeprazole (PRILOSEC) 40 MG capsule Take 40 mg by mouth daily.        . vitamin E 400 UNIT capsule Take 400 Units by mouth daily.       No current facility-administered medications on file prior to visit.      Review of Systems System review is negative for any constitutional, cardiac, pulmonary, GI or neuro symptoms or complaints other than as described in the HPI.     Objective:   Physical Exam Filed Vitals:   05/10/13 1705  BP: 160/66  Pulse: 65  Temp: 97 F (36.1 C)   BP Readings from Last 3 Encounters:  05/10/13 160/66  12/09/12 130/62  07/16/12 150/68   gen'l- WNWD  white woman in no distress. HEENT- normal Cor- RRR, no JVD, no carotid bruits PUlm - normal respirations Neuro - CN II-XII normal, DTRs normal , Cerebellar function abnormal with dysdiadochokinesia with the left hand, standing at station with eyes closed there is imbalance, cannot tandem gait. Cognition is normal.       Assessment & Plan:  Ataxia - subtle signs on exam suggest mid-line cerebellar injury - on exam she could not tandem gait, was off balance at station with eyes closed and had subtle decreased coordination of the left hand. These signs suggest that there may have been a tiny bit of damage to the cerebellum, the small hind brain, or brainstem affecting balance.  Plan Turn the light on when you get up at night  Start aspirin 81 mg daily  MRI scan of the brain to look for any injury.

## 2013-05-11 NOTE — Assessment & Plan Note (Addendum)
Intolerant of chlorthalidone due to fatigue. On carvedilol alone with poor control  Plan Add furosemide 20 mg daily

## 2013-05-18 ENCOUNTER — Ambulatory Visit
Admission: RE | Admit: 2013-05-18 | Discharge: 2013-05-18 | Disposition: A | Discharge status: Home / Self Care | Payer: Medicare Other | Source: Ambulatory Visit | Attending: Internal Medicine | Admitting: Internal Medicine

## 2013-05-18 DIAGNOSIS — R270 Ataxia, unspecified: Secondary | ICD-10-CM

## 2013-06-08 ENCOUNTER — Ambulatory Visit (INDEPENDENT_AMBULATORY_CARE_PROVIDER_SITE_OTHER): Payer: Medicare Other | Admitting: Internal Medicine

## 2013-06-08 VITALS — BP 164/58 | HR 71 | Temp 98.0°F | Resp 18 | Ht 64.0 in | Wt 172.0 lb

## 2013-06-08 DIAGNOSIS — L259 Unspecified contact dermatitis, unspecified cause: Secondary | ICD-10-CM

## 2013-06-08 MED ORDER — PREDNISONE 20 MG PO TABS: ORAL_TABLET | ORAL | Status: DC

## 2013-06-08 NOTE — Progress Notes (Signed)
  Subjective:    Patient ID: Claudia Lara, female    DOB: 1931-12-14, 79 y.o.   MRN: [id number]  HPI Claudia Lara is a 79 y.o. female presenting for poison oak on hands/arms and around eyes since Saturday.  Recalls working in the yard over the weekend.  Pulled up weeds with no gloves.  Noticed poison oak in her hand.   Rash is very itchy.  Tried Chlorox and Cortisone 10 with no relief.    Denies fever, nausea, vomiting, tongue/throat swelling.  Review of Systems As stated in HPI - otherwise negative.    Objective:   Physical Exam Filed Vitals:   06/08/13 0944  BP: 164/58  Pulse: 71  Temp: 98 F (36.7 C)  TempSrc: Oral  Resp: 18  Height: 5\' 4"  (1.626 m)  Weight: 172 lb (78.019 kg)  SpO2: 97%   Skin:  Vesicular eruption on erythematous base - large patch on RIGHT wrist, some on digits bilaterally.  Periorbital edema and redness without injection of conjunctiva.        Assessment & Plan:  1. Contact dermatitis Prednisone taper given for 12 days.    I participated fully in this evaluation I have reviewed and agree with documentation. Robert P. Merla Riches, M.D.

## 2013-06-24 ENCOUNTER — Encounter: Payer: Self-pay | Admitting: Internal Medicine

## 2013-06-24 ENCOUNTER — Ambulatory Visit (INDEPENDENT_AMBULATORY_CARE_PROVIDER_SITE_OTHER): Payer: Medicare Other | Admitting: Internal Medicine

## 2013-06-24 VITALS — BP 148/68 | HR 68 | Temp 97.9°F | Wt 174.0 lb

## 2013-06-24 DIAGNOSIS — R0789 Other chest pain: Secondary | ICD-10-CM | POA: Insufficient documentation

## 2013-06-24 DIAGNOSIS — R079 Chest pain, unspecified: Secondary | ICD-10-CM

## 2013-06-24 NOTE — Progress Notes (Signed)
Subjective:    Patient ID: Claudia Lara, female    DOB: August 17, 1932, 79 y.o.   MRN: [id number]  HPI Friday she had pain in the center of her chest along with diaphoresis. The pain resolved. Saturday night she was awakened by chest pain. She was also had drawing of her face on the left. She has not had recurring chest pain. EMS was called on Saturday night - in-field EKG was normal.   Chart reviewed: in 2007 she had Myoview with EF 70% w/o ST-T wave changes. Jan '09 seen by Dr. Gala Romney for DOE. Subsequent cardio-pulmonary stress test was "electrically" positive; 2 D echo with EF 55-65% and increased pulmonary pressure. He had recommended at right and left heart cath but the patient who was feeling better cancelled.   Saturday night she also had an episode (2nd time) of drawing of the left side of her face. This was transient. She is taking ASA 81 mg daily.  Past Medical History  Diagnosis Date  . ANEMIA 08/22/2009  . Unspecified essential hypertension 10/19/2007  . CAROTID ARTERY STENOSIS 01/16/2010  . PVD 10/06/2007  . HEMORRHOIDS, INTERNAL     surgery was in the 90's (per patient)  . GEN OSTEOARTHROSIS INVOLVING MULTIPLE SITES 11/11/2008  . NECK PAIN, ACUTE 12/28/2009  . PERIPHERAL EDEMA 10/06/2007  . DYSPNEA ON EXERTION 11/16/2007   Past Surgical History  Procedure Laterality Date  . Hyperplastic colon polyps, removed  2007    By Dr. Evette Cristal  . Oophorectomy    . Rotator cuff repair  2004    left (Dr. Cleophas Dunker)  . Abdominal hysterectomy    . Joint replacement  2011    right knee   Family History  Problem Relation Age of Onset  . Ovarian cancer Mother   . Cancer Mother   . Leukemia Father   . Lung cancer Father   . Cancer Father   . Diabetes Neg Hx   . Heart disease Neg Hx   . Hypertension Neg Hx   . Cancer Brother    History   Social History  . Marital Status: Divorced    Spouse Name: N/A    Number of Children: N/A  . Years of Education: 12   Occupational History   . Diplomatic Services operational officer     retired   Social History Main Topics  . Smoking status: Never Smoker   . Smokeless tobacco: Never Used  . Alcohol Use: No  . Drug Use: No  . Sexually Active: Not on file   Other Topics Concern  . Not on file   Social History Narrative   HSG. Married - divorced for many years.  Retired. Lives alone - very active and independent.    Current Outpatient Prescriptions on File Prior to Visit  Medication Sig Dispense Refill  . Ascorbic Acid (VITAMIN C) 500 MG tablet Take 500 mg by mouth daily.        . Calcium Carbonate-Vit D-Min (CALCIUM 600 + MINERALS) 600-200 MG-UNIT TABS Take by mouth daily.        . carvedilol (COREG) 12.5 MG tablet Take 1 tablet (12.5 mg total) by mouth 2 (two) times daily with a meal.  360 tablet  3  . Glucosamine-Chondroit-Vit C-Mn (GLUCOSAMINE 1500 COMPLEX) CAPS Take by mouth daily.        . meloxicam (MOBIC) 15 MG tablet TAKE 1 TABLET (15 MG TOTAL) BY MOUTH DAILY.  90 tablet  3  . omeprazole (PRILOSEC) 40 MG capsule Take 40 mg by mouth  daily.        . predniSONE (DELTASONE) 20 MG tablet 3/3/3/3/2/2/2/2/1/1/1/1 single daily dose for 12 days  24 tablet  0  . vitamin E 400 UNIT capsule Take 400 Units by mouth daily.      . furosemide (LASIX) 20 MG tablet Take 1 tablet (20 mg total) by mouth daily.  30 tablet  3   No current facility-administered medications on file prior to visit.       Review of Systems System review is negative for any constitutional, cardiac, pulmonary, GI or neuro symptoms or complaints other than as described in the HPI.     Objective:   Physical Exam Filed Vitals:   06/24/13 1104  BP: 148/68  Pulse: 68  Temp: 97.9 F (36.6 C)   Wt Readings from Last 3 Encounters:  06/24/13 174 lb (78.926 kg)  06/08/13 172 lb (78.019 kg)  05/10/13 172 lb (78.019 kg)   BP Readings from Last 3 Encounters:  06/24/13 148/68  06/08/13 164/58  05/10/13 160/66   Gen'l- WNWD woman in no distress HEENT- C&S clear, PERRLA Cor  2_+ radial pulse, RRR w/o mm, rub, gallop Pulm - normal respirations. Neuro - non-focal.       Assessment & Plan:

## 2013-06-24 NOTE — Assessment & Plan Note (Signed)
Mrs. Claudia Lara with two episodes of SSCP, first while walking at [facility] accompanied by diaphoresis. Second episode awakened her from sleep. Cardiac risk: post-menopausal, hypertension, Carotid disease, age, previous abnormal stress test in 2009.  Plan To see Dr. 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

## 2013-06-24 NOTE — Patient Instructions (Addendum)
Chest pain that is worrisome for angina. On reviewing your chart there was some irregularity in the cardio-pulmonary stress test that you had.  Plan Continue Aspirin  See Dr. Patty Sermons on Monday, Aug 4th as scheduled.  For recurrent substernal chest pain please go to [facility] and tell them you are a [facility] patient with chest pain.  Angina Pectoris Angina pectoris, often just called angina, is extreme discomfort in your chest, neck, or arm caused by a lack of blood in the middle and thickest layer of your heart wall (myocardium). It may feel like tightness or heavy pressure. It may feel like a crushing or squeezing pain. Some people say it feels like gas or indigestion. It may go down your shoulders, back, and arms. Some people may have symptoms other than pain. These symptoms include fatigue, shortness of breath, cold sweats, or nausea. There are four different types of angina:  Stable angina Stable angina usually occurs in episodes of predictable frequency and duration. It usually is brought on by physical activity, emotional stress, or excitement. These are all times when the myocardium needs more oxygen. Stable angina usually lasts a few minutes and often is relieved by taking a medicine that can be taken under your tongue (sublingually). The medicine is called nitroglycerin. Stable angina is caused by a buildup of plaque inside the arteries, which restricts blood flow to the heart muscle (atherosclerosis).  Unstable angina Unstable angina can occur even when your body experiences little or no physical exertion. It can occur during sleep. It can also occur at rest. It can suddenly increase in severity or frequency. It might not be relieved by sublingual nitroglycerin. It can last up to 30 minutes. The most common cause of unstable angina is a blood clot that has developed on the top of plaque buildup inside a coronary artery. It can lead to a heart attack if the blood clot  completely blocks the artery.  Microvascular angina This type of angina is caused by a disorder of tiny blood vessels called arterioles. Microvascular angina is more common in women. The pain may be more severe and last longer than other types of angina pectoris.  Prinzmetal or variant angina This type of angina pectoris usually occurs when your body experiences little or no physical exertion. It especially occurs in the early morning hours. It is caused by a spasm of your coronary artery. HOME CARE INSTRUCTIONS   Only take over-the-counter and prescription medicines as directed by your caregiver.  Stay active or increase your exercise as directed by your caregiver.  Limit strenuous activity as directed by your caregiver.  Limit heavy lifting as directed by your caregiver.  Maintain a healthy weight.  Learn about and eat heart-healthy foods.  Do not smoke. SEEK IMMEDIATE MEDICAL CARE IF:  You experience the following symptoms:  Chest, neck, deep shoulder, or arm pain or discomfort that lasts more than a few minutes.  Chest, neck, deep shoulder, or arm pain or discomfort that goes away and comes back, repeatedly.  Heavy sweating with discomfort, without a noticeable cause.  Shortness of breath or difficulty breathing.  Angina that does not get better after a few minutes of rest or after taking sublingual nitroglycerin. These can all be symptoms of a heart attack, which is a medical emergency! Get medical help at once. Call your local emergency service (911 in [place] immediately. Do not  drive yourself to the hospital and do not  wait to for your symptoms to go  away. MAKE SURE YOU:  Understand these instructions.  Will watch your condition.  Will get help right away if you are not doing well or get worse. Document Released: 11/11/2005 Document Revised: 10/28/2012 Document Reviewed: 08/20/2012 [facility] Patient Information 2014 [place], [place].

## 2013-06-28 ENCOUNTER — Encounter: Payer: Self-pay | Admitting: Cardiology

## 2013-06-28 ENCOUNTER — Ambulatory Visit (INDEPENDENT_AMBULATORY_CARE_PROVIDER_SITE_OTHER): Payer: Medicare Other | Admitting: Cardiology

## 2013-06-28 VITALS — BP 142/72 | HR 89 | Ht 64.0 in | Wt 177.4 lb

## 2013-06-28 DIAGNOSIS — R079 Chest pain, unspecified: Secondary | ICD-10-CM

## 2013-06-28 DIAGNOSIS — R011 Cardiac murmur, unspecified: Secondary | ICD-10-CM

## 2013-06-28 DIAGNOSIS — R609 Edema, unspecified: Secondary | ICD-10-CM

## 2013-06-28 NOTE — Progress Notes (Signed)
Claudia Lara Date of Birth:  January 02, 1932 [facility] [address] [place], [place]  [address] [phone]         Fax   [phone]  History of Present Illness: This 79 year old divorced Caucasian female is seen by me for the first time today.  She is seen at the request of her internist Dr. Debby Bud.  She is being seen because of recent chest pain.  She does not have any history of known ischemic heart disease.  She had a normal nuclear stress test in 2007.  In 2009 she had an echocardiogram showing normal left ventricular systolic function and mild mitral regurgitation and no significant aortic valve disease.  She does have a past history of high blood pressure.  She is a nonsmoker.  She is not diabetic.  She denies any history of hypercholesterolemia.  She does have a history of chronic anemia.  She is due to have a complete physical with Dr. Debby Bud in the next several weeks at which time her labs will be rechecked. The reason for today's visit is that 10 days ago wall walking in [employer] she developed sudden substernal chest discomfort associated with dyspnea and with profuse diaphoresis.  She did not seek any medical attention at that time.  The following night she awoke with substernal chest discomfort and was frightened and called EMS. They came to the home and did an EKG in the field and reassured her that she was okay and she did not have to go to the emergency room.  She has had no further episodes of chest pain since then.  Normally she has good exercise tolerance.  She still does her yard work.  She has a flat backyard and uses a push mower to cut her grass.  On her front yard which is more hilly she uses a Environmental consultant that she walks behind.  Current Outpatient Prescriptions  Medication Sig Dispense Refill  . Ascorbic Acid (VITAMIN C) 500 MG tablet Take 500 mg by mouth daily.        [name] aspirin 81 MG tablet Take 81 mg by mouth daily.      . Calcium  Carbonate-Vit D-Min (CALCIUM 600 + MINERALS) 600-200 MG-UNIT TABS Take by mouth daily.        . carvedilol (COREG) 12.5 MG tablet Take 1 tablet (12.5 mg total) by mouth 2 (two) times daily with a meal.  360 tablet  3  . Glucosamine-Chondroit-Vit C-Mn (GLUCOSAMINE 1500 COMPLEX) CAPS Take by mouth daily.        . meloxicam (MOBIC) 15 MG tablet TAKE 1 TABLET (15 MG TOTAL) BY MOUTH DAILY.  90 tablet  3  . omeprazole (PRILOSEC) 40 MG capsule Take 40 mg by mouth daily.        . vitamin E 400 UNIT capsule Take 400 Units by mouth daily.      . furosemide (LASIX) 20 MG tablet Take 1 tablet (20 mg total) by mouth daily.  30 tablet  3   No current facility-administered medications for this visit.    Allergies  Allergen Reactions  . Sulfonamide Derivatives     Patient Active Problem List   Diagnosis Date Noted  . Chest pain with high risk for cardiac etiology 06/24/2013  . Routine health maintenance 07/16/2012  . Need for prophylactic vaccination with tetanus-diphtheria (TD) 07/16/2012  . CAROTID ARTERY STENOSIS 01/16/2010  . NECK PAIN, ACUTE 12/28/2009  . ANEMIA 08/22/2009  . GEN OSTEOARTHROSIS INVOLVING MULTIPLE SITES 11/11/2008  .  HEMORRHOIDS, INTERNAL 07/12/2008  . DYSPNEA ON EXERTION 11/16/2007  . Unspecified essential hypertension 10/19/2007  . PVD 10/06/2007  . PERIPHERAL EDEMA 10/06/2007    History  Smoking status  . Never Smoker   Smokeless tobacco  . Never Used    History  Alcohol Use No    Family History  Problem Relation Age of Onset  . Ovarian cancer Mother   . Cancer Mother   . Leukemia Father   . Lung cancer Father   . Cancer Father   . Diabetes Neg Hx   . Heart disease Neg Hx   . Hypertension Neg Hx   . Cancer Brother     Review of Systems: Constitutional: no fever chills diaphoresis or fatigue or change in weight.  Head and neck: no hearing loss, no epistaxis, no photophobia or visual disturbance. Respiratory: No cough, shortness of breath or  wheezing. Cardiovascular: Positive for chest pain and peripheral edema palpitations. Gastrointestinal: No abdominal distention, no abdominal pain, no change in bowel habits hematochezia or melena. Genitourinary: No dysuria, no frequency, no urgency, no nocturia. Musculoskeletal:No arthralgias, no back pain, no gait disturbance or myalgias. Neurological: No dizziness, no headaches, no numbness, no seizures, no syncope, no weakness, no tremors. Hematologic: No lymphadenopathy, no easy bruising. Psychiatric: No confusion, no hallucinations, no sleep disturbance.    Physical Exam: Filed Vitals:   06/28/13 1526  BP: 142/72  Pulse: 89   the general appearance reveals a well-developed well-nourished woman in no distress.The head and neck exam reveals pupils equal and reactive.  Extraocular movements are full.  There is no scleral icterus.  The mouth and pharynx are normal.  The neck is supple.  The carotids reveal soft left carotid bruit.  The jugular venous pressure is normal.  The  thyroid is not enlarged.  There is no lymphadenopathy.  The chest is clear to percussion and auscultation.  There are no rales or rhonchi.  Expansion of the chest is symmetrical.  The precordium is quiet.  The first heart sound is normal.  The second heart sound is physiologically split.  There is grade 2/6 systolic ejection murmur at the aortic area.  No diastolic murmur.  No gallop or rub.  There is no abnormal lift or heave.  The abdomen is soft and nontender.  The bowel sounds are normal.  The liver and spleen are not enlarged.  There are no abdominal masses.  There are no abdominal bruits.  Extremities reveal good pedal pulses.  There is mild peripheral edema bilaterally.  There is no cyanosis or clubbing.  Strength is normal and symmetrical in all extremities.  There is no lateralizing weakness.  There are no sensory deficits.  The skin is warm and dry.  There is no rash.  EKG shows normal sinus rhythm and is within  normal limits   Assessment / Plan: 1. chest pain with concern for ischemic heart disease.  Past history of remote "electrocardiographically positive" cardiopulmonary stress test. 2. hypertensive cardiovascular disease with past history of LVH. 3. systolic heart murmur at base 4. soft asymptomatic left carotid bruit for which she has been followed with yearly carotid duplex. 5. peripheral edema  Plan: Continue current medication.  Return soon for a two-dimensional echocardiogram to evaluate her heart murmur further. Return also for a treadmill Myoview stress test She is scheduled to go on a bus trip to [place] on 07/19/13 and she is anxious to be reassured about her heart condition as soon as possible.

## 2013-06-28 NOTE — Patient Instructions (Signed)
Your physician has requested that you have en exercise stress myoview. Please follow instruction sheet, as given.  Your physician has requested that you have an echocardiogram. Echocardiography is a painless test that uses sound waves to create images of your heart. It provides your doctor with information about the size and shape of your heart and how well your heart's chambers and valves are working. This procedure takes approximately one hour. There are no restrictions for this procedure.  Your physician recommends that you schedule a follow-up appointment in: as needed basis depending on the results

## 2013-06-30 ENCOUNTER — Ambulatory Visit (HOSPITAL_COMMUNITY): Payer: Medicare Other | Attending: Cardiology

## 2013-06-30 ENCOUNTER — Other Ambulatory Visit: Payer: Self-pay

## 2013-06-30 DIAGNOSIS — R011 Cardiac murmur, unspecified: Secondary | ICD-10-CM

## 2013-06-30 DIAGNOSIS — I059 Rheumatic mitral valve disease, unspecified: Secondary | ICD-10-CM | POA: Insufficient documentation

## 2013-06-30 DIAGNOSIS — R609 Edema, unspecified: Secondary | ICD-10-CM

## 2013-06-30 NOTE — Progress Notes (Signed)
Echocardiogram performed.  

## 2013-07-01 ENCOUNTER — Ambulatory Visit (HOSPITAL_COMMUNITY): Payer: Medicare Other | Attending: Cardiology | Admitting: Radiology

## 2013-07-01 VITALS — BP 192/71 | HR 70 | Ht 64.0 in | Wt 174.0 lb

## 2013-07-01 DIAGNOSIS — R079 Chest pain, unspecified: Secondary | ICD-10-CM

## 2013-07-01 DIAGNOSIS — R0602 Shortness of breath: Secondary | ICD-10-CM

## 2013-07-01 DIAGNOSIS — I10 Essential (primary) hypertension: Secondary | ICD-10-CM | POA: Insufficient documentation

## 2013-07-01 DIAGNOSIS — I479 Paroxysmal tachycardia, unspecified: Secondary | ICD-10-CM

## 2013-07-01 DIAGNOSIS — I779 Disorder of arteries and arterioles, unspecified: Secondary | ICD-10-CM | POA: Insufficient documentation

## 2013-07-01 DIAGNOSIS — I4949 Other premature depolarization: Secondary | ICD-10-CM

## 2013-07-01 DIAGNOSIS — I739 Peripheral vascular disease, unspecified: Secondary | ICD-10-CM | POA: Insufficient documentation

## 2013-07-01 DIAGNOSIS — R0789 Other chest pain: Secondary | ICD-10-CM | POA: Insufficient documentation

## 2013-07-01 DIAGNOSIS — R61 Generalized hyperhidrosis: Secondary | ICD-10-CM | POA: Insufficient documentation

## 2013-07-01 DIAGNOSIS — R0989 Other specified symptoms and signs involving the circulatory and respiratory systems: Secondary | ICD-10-CM | POA: Insufficient documentation

## 2013-07-01 DIAGNOSIS — R0609 Other forms of dyspnea: Secondary | ICD-10-CM | POA: Insufficient documentation

## 2013-07-01 MED ORDER — TECHNETIUM TC 99M SESTAMIBI GENERIC - CARDIOLITE
30.0000 | Freq: Once | INTRAVENOUS | Status: AC | PRN
  Administered 2013-07-01: 30 via INTRAVENOUS

## 2013-07-01 MED ORDER — TECHNETIUM TC 99M SESTAMIBI GENERIC - CARDIOLITE
10.0000 | Freq: Once | INTRAVENOUS | Status: AC | PRN
  Administered 2013-07-01: 10 via INTRAVENOUS

## 2013-07-01 NOTE — Progress Notes (Signed)
[facility] SITE 3 NUCLEAR MED [address] [place], [place] [address] [phone]    Cardiology Nuclear Med Study  Claudia Lara Grand is a 79 y.o. female     MRN : [id number]     DOB: [birth year]  Procedure Date: 07/01/2013  Nuclear Med Background Indication for Stress Test:  Evaluation for Ischemia History:  '07 OZH:[id number], EF=70%; '09 Echo:EF=65%, mild MR Cardiac Risk Factors: Carotid Disease, Hypertension and PVD  Symptoms:  Chest Pain with Exertion (last episode of chest discomfort was about a week ago), Diaphoresis and DOE   Nuclear Pre-Procedure Caffeine/Decaff Intake:  None NPO After: 5 pm   Lungs:  Clear. O2 Sat: 96% on room air. IV 0.9% NS with Angio Cath:  22g  IV Site: R Hand  IV Started by:  Bonnita Levan, RN  Chest Size (in):  40 Cup Size: C  Height: 5\' 4"  (1.626 m)  Weight:  174 lb (78.926 kg)  BMI:  Body mass index is 29.85 kg/(m^2). Tech Comments:  Coreg held > 24 hours    Nuclear Med Study 1 or 2 day study: 1 day  Stress Test Type:  Stress  Reading MD: Cassell Clement, MD  Order Authorizing Provider:  Cassell Clement, MD  Resting Radionuclide: Technetium 53m Sestamibi  Resting Radionuclide Dose: 11.0 mCi   Stress Radionuclide:  Technetium 40m Sestamibi  Stress Radionuclide Dose: 33.0 mCi           Stress Protocol Rest HR: 70 Stress HR: 150  Rest BP: 192/71 Stress BP: 204/72  Exercise Time (min): 2:46 METS: 4.6   Predicted Max HR: 141 bpm % Max HR: 106.38 bpm Rate Pressure Product: 46962   Dose of Adenosine (mg):  n/a Dose of Lexiscan: n/a mg  Dose of Atropine (mg): n/a Dose of Dobutamine: n/a mcg/kg/min (at max HR)  Stress Test Technologist: Smiley Houseman, CMA-N  Nuclear Technologist:  Harlow Asa, CNMT     Rest Procedure:  Myocardial perfusion imaging was performed at rest 45 minutes following the intravenous administration of Technetium 11m Sestamibi.  Rest ECG: NSR - Normal EKG  Stress Procedure:  The patient exercised on the  treadmill utilizing the Bruce Protocol for 2:46 minutes. The patient stopped due to significant dyspnea with peak O2 SAT of 94%.  She denied any chest pain.  Technetium 92m Sestamibi was injected at peak exercise and myocardial perfusion imaging was performed after a brief delay.  Stress ECG: No significant change from baseline ECG  QPS Raw Data Images:  Normal; no motion artifact; normal heart/lung ratio. Stress Images:  Normal homogeneous uptake in all areas of the myocardium. Rest Images:  Normal homogeneous uptake in all areas of the myocardium. Subtraction (SDS):  No evidence of ischemia. Transient Ischemic Dilatation (Normal <1.22):  n/a Lung/Heart Ratio (Normal <0.45):  0.35  Quantitative Gated Spect Images QGS EDV:  69 ml QGS ESV:  24 ml  Impression Exercise Capacity:  Fair exercise capacity. BP Response:  Hypertensive blood pressure response. Clinical Symptoms:  No chest pain. ECG Impression:  No significant ST segment change suggestive of ischemia.  Occasional PVCs with exercise.  Comparison with Prior Nuclear Study: No images to compare  Overall Impression:  Low risk stress nuclear study. No perfusion evidence of ischemia or scar. Hypertensive response to exercise.    LV Ejection Fraction: 66%.  LV Wall Motion:  NL LV Function; NL Wall Motion  [employer]

## 2013-07-05 ENCOUNTER — Institutional Professional Consult (permissible substitution): Payer: Medicare Other | Admitting: Internal Medicine

## 2013-07-07 ENCOUNTER — Encounter: Payer: Self-pay | Admitting: Internal Medicine

## 2013-07-07 ENCOUNTER — Other Ambulatory Visit (INDEPENDENT_AMBULATORY_CARE_PROVIDER_SITE_OTHER): Payer: Medicare Other

## 2013-07-07 ENCOUNTER — Ambulatory Visit (INDEPENDENT_AMBULATORY_CARE_PROVIDER_SITE_OTHER): Payer: Medicare Other | Admitting: Internal Medicine

## 2013-07-07 VITALS — BP 144/72 | HR 69 | Temp 97.6°F | Ht 64.0 in | Wt 177.0 lb

## 2013-07-07 DIAGNOSIS — I10 Essential (primary) hypertension: Secondary | ICD-10-CM

## 2013-07-07 DIAGNOSIS — M159 Polyosteoarthritis, unspecified: Secondary | ICD-10-CM

## 2013-07-07 DIAGNOSIS — D649 Anemia, unspecified: Secondary | ICD-10-CM

## 2013-07-07 DIAGNOSIS — R0609 Other forms of dyspnea: Secondary | ICD-10-CM

## 2013-07-07 DIAGNOSIS — R0989 Other specified symptoms and signs involving the circulatory and respiratory systems: Secondary | ICD-10-CM

## 2013-07-07 DIAGNOSIS — Z0000 Encounter for general adult medical examination without abnormal findings: Secondary | ICD-10-CM

## 2013-07-07 DIAGNOSIS — I6529 Occlusion and stenosis of unspecified carotid artery: Secondary | ICD-10-CM

## 2013-07-07 DIAGNOSIS — R079 Chest pain, unspecified: Secondary | ICD-10-CM

## 2013-07-07 LAB — HEMOGLOBIN AND HEMATOCRIT, BLOOD: Hemoglobin: 9.9 g/dL — ABNORMAL LOW (ref 12.0–15.0)

## 2013-07-07 LAB — COMPREHENSIVE METABOLIC PANEL
Albumin: 4.1 g/dL (ref 3.5–5.2)
CO2: 27 mEq/L (ref 19–32)
Calcium: 9.3 mg/dL (ref 8.4–10.5)
GFR: 44.73 mL/min — ABNORMAL LOW (ref >60.00)
Glucose, Bld: 99 mg/dL (ref 70–99)
Potassium: 5.2 mEq/L — ABNORMAL HIGH (ref 3.5–5.1)
Sodium: 140 mEq/L (ref 135–145)
Total Protein: 6.5 g/dL (ref 6.0–8.3)

## 2013-07-07 NOTE — Assessment & Plan Note (Addendum)
Dyspnea upon exertion: Likely due to a combination of anemia and pulmonary dysfunction.  Plan:  H&H and BMet ordered.     Refer to pulmonology for PFT's.

## 2013-07-07 NOTE — Patient Instructions (Addendum)
Thanks for working with me Claudia Lara) today!  Your physical was great.  I did not detect any abnormalities.  Shortness of breath: We will refer your for Pulmonary Function Testing (PFT's). Plan: make and attend an appointment when contacted by the pulmonologist's office.  Follow up with this is this gets worse.  High blood pressure: Your blood pressure was a little high today.   Plan: Please keep taking your medications.  While on your trip, you can take chlorthalidone rather than hydrochlorothiazide.    Have a great trip!

## 2013-07-07 NOTE — Progress Notes (Signed)
Subjective:     Patient ID: Claudia Lara, female   DOB: 1932-03-06, 79 y.o.   MRN: [id number]  HPI Ms. Seaberg is a 79 year-old lady here for annual Medicare wellness examination and management of other chronic and acute problems.  Since her last visit, she has had an EKG, echo and nuclear stress test.  All were performed by Dr. Fulton Reek last week.  All results normal.  She has been experiencing some easy fatigability. She describes fatigue to get up steps once she reaches them; relies on railing.  Has been going on for "five years."   The risk factors are reflected in the social history.  The roster of all physicians providing medical care to patient - is listed in the Snapshot section of the chart.  Activities of daily living:  The patient is 100% inedpendent in all ADLs: dressing, toileting, feeding as well as independent mobility.  Home safety : The patient has smoke detectors in the home. They wear seatbelts.  Firearms are present in the home, kept in a safe fashion. There is no violence in the home.   There is no risks for hepatitis, STDs or HIV. There is a history of blood transfusion (November of 2011). They have no travel history to infectious disease endemic areas of the world.  The patient has seen their dentist in the last six month. They have seen their eye doctor ([facility]) in the last year. They deny  any hearing difficulty and have not had audiologic testing in the last year.  They do not have excessive sun exposure. Discussed the need for sun protection: hats, long sleeves and use of sunscreen if there is significant sun exposure.   Diet: the importance of a healthy diet is discussed. They do have a sweet-rich diet.    The patient has a regular exercise program: works in the garden during the summer; in the winter, rides a stationary bike for an undetermined amount of time daily.  The benefits of regular aerobic exercise were discussed.  Depression screen: there are no signs  or vegative symptoms of depression- irritability, change in appetite, anhedonia, sadness/tearfullness.  Cognitive assessment: the patient manages all their financial and personal affairs and is actively engaged. They could relate day,date,year and events; recalled 3/3 objects at 3 minutes; performed clock-face test normally.  The following portions of the patient's history were reviewed and updated as appropriate: allergies, current medications, past family history, past medical history,  past surgical history, past social history  and problem list.  Vision, hearing, body mass index were assessed and reviewed.   During the course of the visit the patient was educated and counseled about appropriate screening and preventive services including : fall prevention (2 years ago), diabetes screening, nutrition counseling, colorectal cancer screening, and recommended immunizations.  Review of Systems  Constitutional: Positive for fatigue. Negative for fever.  HENT: Negative for hearing loss, ear pain, tinnitus and ear discharge.   Eyes: Negative for photophobia, pain and visual disturbance.  Respiratory: Negative for cough, choking, chest tightness and shortness of breath.   Cardiovascular: Negative for chest pain, palpitations and leg swelling.  Gastrointestinal: Negative for nausea, abdominal pain, diarrhea and constipation.       Objective:   Physical Exam  Constitutional: She is oriented to person, place, and time. She appears well-developed and well-nourished. No distress.  HENT:  Head: Normocephalic and atraumatic.  Right Ear: External ear normal.  Left Ear: External ear normal.  Nose: Nose normal.  Mouth/Throat: Oropharynx  is clear and moist. No oropharyngeal exudate.  Eyes: Conjunctivae and EOM are normal. Pupils are equal, round, and reactive to light. Right eye exhibits no discharge. Left eye exhibits no discharge. No scleral icterus.  Neck: Neck supple. No JVD present. No tracheal  deviation present.  Cardiovascular: Normal rate, regular rhythm, normal heart sounds and intact distal pulses.  Exam reveals no gallop and no friction rub.   No murmur heard. Pulmonary/Chest: Effort normal and breath sounds normal. No respiratory distress. She has no wheezes. She has no rales. She exhibits no tenderness.  Abdominal: Soft. Bowel sounds are normal. She exhibits no distension and no mass. There is no tenderness. There is no rebound and no guarding.  Neurological: She is alert and oriented to person, place, and time. She has normal reflexes. No cranial nerve deficit.  Skin: She is not diaphoretic.       Assessment and Plan:     Health maintenance: The history and physical exam revealed no acute abnormalities.   Plan: follow up in one year.  Come back sooner if problems arise.  Fatigue is likely at least partially due to dyspnea on exertion.   Plan: As per DOE assessment, H&H and BMet ordered.   Refer to pulmonology for PFT's.

## 2013-07-07 NOTE — Assessment & Plan Note (Signed)
Osteoarthritis: No acute complaints.  Well managed by meloxicam.  Plan: keep taking meloxicam; follow up in clinic in six months, sooner if problems arise.

## 2013-07-12 NOTE — Assessment & Plan Note (Signed)
Interval history is benign with no major medical events. She hs been seen by Dr. Patty Sermons for cardiology. Her limited physical exam is normal. Lab revels borderline high Potssium, low albumin, Hgb 9.9 which appears chronic. Otherwise values are in range. He is current with colorectal cancer screening and last mammogram was in September '13. Immunizations are up to date.   In summary, a nice woman who appears to be medically stable with a chronic  anemia

## 2013-07-12 NOTE — Assessment & Plan Note (Signed)
BP Readings from Last 3 Encounters:  07/07/13 144/72  07/01/13 192/71  06/28/13 142/72   Adequate control at this time. Continue present medications.

## 2013-07-12 NOTE — Assessment & Plan Note (Signed)
Carotid Doppler May 29, '14: Bilateral 40-59% occlusion stable. Follow up in 1 year.

## 2013-07-12 NOTE — Assessment & Plan Note (Signed)
Concern for patient and the possibility of heart disease. She ahs been sden by Dr. Patty Sermons and risk stratification testing has been negative.  Plan For her continued decreased exercise tolerance will schedule PFTs

## 2013-07-14 ENCOUNTER — Encounter: Payer: Self-pay | Admitting: Internal Medicine

## 2013-07-14 ENCOUNTER — Telehealth: Payer: Self-pay | Admitting: Hematology and Oncology

## 2013-07-14 ENCOUNTER — Other Ambulatory Visit: Payer: Self-pay | Admitting: Internal Medicine

## 2013-07-14 ENCOUNTER — Telehealth: Payer: Self-pay

## 2013-07-14 DIAGNOSIS — D649 Anemia, unspecified: Secondary | ICD-10-CM

## 2013-07-14 NOTE — Telephone Encounter (Signed)
C/D 07/14/13 for appt. 08/05/13

## 2013-07-14 NOTE — Telephone Encounter (Signed)
S/W PT IN RE TO NP APPT 09/11 @ 10:30 W/DR. VICTOR REFERRING DR. Casimiro Needle NORINS DX- ANEMIA WELCOME PACKET MAILED.

## 2013-07-26 ENCOUNTER — Other Ambulatory Visit: Payer: Self-pay | Admitting: Internal Medicine

## 2013-08-03 ENCOUNTER — Ambulatory Visit (INDEPENDENT_AMBULATORY_CARE_PROVIDER_SITE_OTHER): Payer: Medicare Other | Admitting: Internal Medicine

## 2013-08-03 DIAGNOSIS — R0609 Other forms of dyspnea: Secondary | ICD-10-CM

## 2013-08-03 LAB — PULMONARY FUNCTION TEST

## 2013-08-03 NOTE — Progress Notes (Signed)
PFT done today. 

## 2013-08-05 ENCOUNTER — Ambulatory Visit (HOSPITAL_BASED_OUTPATIENT_CLINIC_OR_DEPARTMENT_OTHER): Payer: Medicare Other

## 2013-08-05 ENCOUNTER — Ambulatory Visit (HOSPITAL_BASED_OUTPATIENT_CLINIC_OR_DEPARTMENT_OTHER): Payer: Medicare Other | Admitting: Internal Medicine

## 2013-08-05 ENCOUNTER — Ambulatory Visit (HOSPITAL_BASED_OUTPATIENT_CLINIC_OR_DEPARTMENT_OTHER): Payer: Medicare Other | Admitting: Lab

## 2013-08-05 ENCOUNTER — Encounter: Payer: Self-pay | Admitting: Internal Medicine

## 2013-08-05 ENCOUNTER — Telehealth: Payer: Self-pay | Admitting: Internal Medicine

## 2013-08-05 VITALS — BP 170/57 | HR 72 | Resp 18 | Ht 64.0 in | Wt 177.0 lb

## 2013-08-05 DIAGNOSIS — R0609 Other forms of dyspnea: Secondary | ICD-10-CM

## 2013-08-05 DIAGNOSIS — D649 Anemia, unspecified: Secondary | ICD-10-CM

## 2013-08-05 LAB — CBC & DIFF AND RETIC
BASO%: 0.5 % (ref 0.0–2.0)
Eosinophils Absolute: 0.5 10*3/uL (ref 0.0–0.5)
MONO#: 0.7 10*3/uL (ref 0.1–0.9)
NEUT#: 5.4 10*3/uL (ref 1.5–6.5)
RBC: 3.4 10*6/uL — ABNORMAL LOW (ref 3.70–5.45)
RDW: 18.7 % — ABNORMAL HIGH (ref 11.2–14.5)
Retic %: 6.51 % — ABNORMAL HIGH (ref 0.70–2.10)
Retic Ct Abs: 221.34 10*3/uL — ABNORMAL HIGH (ref 33.70–90.70)
WBC: 7.9 10*3/uL (ref 3.9–10.3)
lymph#: 1.2 10*3/uL (ref 0.9–3.3)

## 2013-08-05 LAB — IRON AND TIBC CHCC
%SAT: 43 % (ref 21–57)
TIBC: 248 ug/dL (ref 236–444)
UIBC: 140 ug/dL (ref 120–384)

## 2013-08-05 LAB — COMPREHENSIVE METABOLIC PANEL (CC13)
AST: 23 U/L (ref 5–34)
Albumin: 4 g/dL (ref 3.5–5.0)
Alkaline Phosphatase: 112 U/L (ref 40–150)
Potassium: 4.6 mEq/L (ref 3.5–5.1)
Sodium: 142 mEq/L (ref 136–145)
Total Bilirubin: 1.81 mg/dL — ABNORMAL HIGH (ref 0.20–1.20)
Total Protein: 6.5 g/dL (ref 6.4–8.3)

## 2013-08-05 LAB — FERRITIN CHCC: Ferritin: 230 ng/ml (ref 9–269)

## 2013-08-05 LAB — FOLATE: Folate: >20 ng/mL

## 2013-08-05 LAB — CHCC SMEAR

## 2013-08-05 NOTE — Telephone Encounter (Signed)
gv and printed appt sched and avs for pt for OCT...sent pt back to the lab °

## 2013-08-05 NOTE — Patient Instructions (Addendum)
Iron Chewable Tablets What is this medicine? IRON (AHY ern) replaces iron that is essential to healthy red blood cells. Iron is used to treat iron deficiency anemia. Anemia may cause problems like tiredness, shortness of breath, or slowed growth in children. Only take iron if your doctor has told you to. Do not treat yourself with iron if you are feeling tired. Most healthy people get enough iron in their diets, particularly if they eat cereals, meat, poultry, and fish. This medicine may be used for other purposes; ask your health care provider or pharmacist if you have questions. What should I tell my health care provider before I take this medicine? They need to know if you have any of these conditions: -frequently drink alcohol -bowel disease -hemolytic anemia -iron overload (hemochromatosis, hemosiderosis) -liver disease -problems with swallowing -stomach ulcer or other stomach problems -an unusual or allergic reaction to iron, other medicines, foods, dyes, or preservatives -pregnant or trying to get pregnant -breast-feeding How should I use this medicine? Take this medicine by mouth. Chew it completely before swallowing. Follow the directions on the prescription label. Take this medicine in an upright or sitting position. Try to take any bedtime doses at least 10 minutes before lying down. You may take this medicine with food. Take your medicine at regular intervals. Do not take your medicine more often than directed. Do not stop taking except on your doctor's advice. Talk to your pediatrician regarding the use of this medicine in children. While this drug may be prescribed for even very young children for selected conditions, precautions do apply. Overdosage: If you think you have taken too much of this medicine contact a poison control center or emergency room at once. NOTE: This medicine is only for you. Do not share this medicine with others. What if I miss a dose? If you miss a dose,  take it as soon as you can. If it is almost time for your next dose, take only that dose. Do not take double or extra doses. What may interact with this medicine? If you are taking this iron product, you should not take iron in any other medicine or dietary supplement. This medicine may also interact with the following medications: -alendronate -antacids -cefdinir -chloramphenicol -cholestyramine -deferoxamine -dimercaprol -etidronate -medicines for stomach ulcers or other stomach problems -pancreatic enzymes -quinolone antibiotics (examples: Cipro, Floxin, Levaquin, Tequin and others) -risedronate -tetracycline antibiotics (examples: doxycycline, tetracycline, minocycline, and others) -thyroid hormones This list may not describe all possible interactions. Give your health care provider a list of all the medicines, herbs, non-prescription drugs, or dietary supplements you use. Also tell them if you smoke, drink alcohol, or use illegal drugs. Some items may interact with your medicine. What should I watch for while using this medicine? Use iron supplements only as directed by your health care professional. Bonita Quin will need important blood work while you are taking this medicine. It may take 3 to 6 months of therapy to treat low iron levels. Pregnant women should follow the dose and length of iron treatment as directed by their doctors. Do not use iron longer than prescribed, and do not take a higher dose than recommended. Long-term use may cause excess iron to build-up in the body. Do not take iron with antacids. If you need to take an antacid, take it 2 hours after a dose of iron. What side effects may I notice from receiving this medicine? Side effects that you should report to your doctor or health care professional as soon  as possible: -allergic reactions like skin rash, itching or hives, swelling of the face, lips, or tongue -blue lips, nails, or palms -dark colored stools (this may be  due to the iron, but can indicate a more serious condition) -drowsiness -pain with or difficulty swallowing -pale or clammy skin -seizures -stomach pain -unusually weak or tired -vomiting -weak, fast, or irregular heartbeat Side effects that usually do not require medical attention (report to your doctor or health care professional if they continue or are bothersome): -constipation -indigestion -nausea or stomach upset This list may not describe all possible side effects. Call your doctor for medical advice about side effects. You may report side effects to FDA at 1-800-FDA-1088. Where should I keep my medicine? Keep out of the reach of children. Even small amounts of iron can be harmful to a child. Store at room temperature between 15 and 30 degrees C (59 and 86 degrees F). Keep container tightly closed. Throw away any unused medicine after the expiration date. NOTE: This sheet is a summary. It may not cover all possible information. If you have questions about this medicine, talk to your doctor, pharmacist, or health care provider.  2012, Elsevier/Gold Standard. (03/31/2008 5:08:28 PM)Iron Deficiency Anemia There are many types of anemia. Iron deficiency anemia is the most common. Iron deficiency anemia is a decrease in the number of red blood cells caused by too little iron. Without enough iron, your body does not produce enough hemoglobin. Hemoglobin is a substance in red blood cells that carries oxygen to the body's tissues. Iron deficiency anemia may leave you tired and short of breath. CAUSES   Lack of iron in the diet.  This may be seen in infants and children, because there is little iron in milk.  This may be seen in adults who do not eat enough iron-rich foods.  This may be seen in pregnant or breastfeeding women who do not take iron supplements. There is a much higher need for iron intake at these times.  Poor absorption of iron, as seen with intestinal  disorders.  Intestinal bleeding.  Heavy periods. SYMPTOMS  Mild anemia may not be noticeable. Symptoms may include:  Fatigue.  Headache.  Pale skin.  Weakness.  Shortness of breath.  Dizziness.  Cold hands and feet.  Fast or irregular heartbeat. DIAGNOSIS  Diagnosis requires a thorough evaluation and physical exam by your caregiver.  Blood tests are generally used to confirm iron deficiency anemia.  Additional tests may be done to find the underlying cause of your anemia. These may include:  Testing for blood in the stool (fecal occult blood test).  A procedure to see inside the colon and rectum (colonoscopy).  A procedure to see inside the esophagus and stomach (endoscopy). TREATMENT   Correcting the cause of the iron deficiency is the first step.  Medicines, such as oral contraceptives, can make heavy menstrual flows lighter.  Antibiotics and other medicines can be used to treat peptic ulcers.  Surgery may be needed to remove a bleeding polyp, tumor, or fibroid.  Often, iron supplements (ferrous sulfate) are taken.  For the best iron absorption, take these supplements with an empty stomach.  You may need to take the supplements with food if you cannot tolerate them on an empty stomach. Vitamin C improves the absorption of iron. Your caregiver may recommend taking your iron tablets with a glass of orange juice or vitamin C supplement.  Milk and antacids should not be taken at the same time as iron  supplements. They may interfere with the absorption of iron.  Iron supplements can cause constipation. A stool softener is often recommended.  Pregnant and breastfeeding women will need to take extra iron, because their normal diet usually will not provide the required amount.  Patients who cannot tolerate iron by mouth can take it through a vein (intravenously) or by an injection into the muscle. HOME CARE INSTRUCTIONS   Ask your dietitian for help with diet  questions.  Take iron and vitamins as directed by your caregiver.  Eat a diet rich in iron. Eat liver, lean beef, whole-grain bread, eggs, dried fruit, and dark green leafy vegetables. SEEK IMMEDIATE MEDICAL CARE IF:   You have a fainting episode. Do not drive yourself. Call your local emergency services (911 in [place] if no other help is available.  You have chest pain, nausea, or vomiting.  You develop severe or increased shortness of breath with activities.  You develop weakness or increased thirst.  You have a rapid heartbeat.  You develop unexplained sweating or become lightheaded when getting up from a chair or bed. MAKE SURE YOU:   Understand these instructions.  Will watch your condition.  Will get help right away if you are not doing well or get worse. Document Released: 11/08/2000 Document Revised: 02/03/2012 Document Reviewed: 03/20/2010 [facility] Patient Information 2014 [place], [place].

## 2013-08-05 NOTE — Progress Notes (Signed)
Checked in new patient with no financial issues. email already in with [employer]

## 2013-08-08 ENCOUNTER — Other Ambulatory Visit: Payer: Self-pay | Admitting: Internal Medicine

## 2013-08-08 DIAGNOSIS — D649 Anemia, unspecified: Secondary | ICD-10-CM

## 2013-08-08 NOTE — Progress Notes (Signed)
Patient History and Physical   Jacques Navy, MD [address] [place], [place] [address]  Claudia Lara [id number] [birth year] 79 y.o.   Chief Complaint: Anemia  HPI:  Patient is very pleasant 79 year-old female with a history of hypertension who presents for further evaluation of anemia by her PCP Dr. Debby Bud.   She reports taking supplemental iron from time to time with her first low hemoglobin about 5 years ago.  She reports recently having increased dyspnea on exertion.  She used to walk about a mile per day but quit about 2 years ago due to increased DOE.  She denies a history of blood transfusions outside of when she received packed red blood cells intraoperatively in November 2011 during her right knee replacement surgery.  She denies a history of blood clots.  She reports being up-to-date on her screening exams with her last colonoscopy at age 51.  Her mammogram is scheduled for this month.  She denies chest pain, palpitations, picca.  However, earlier in August, she did have pain in the center of her chest along with diaphoresis. In 2007,  She had a myoview with an EF of 70% without ST-T wave changes.  She denies a history of bleeding problems or recurrent infections.  She reports feeling fatigued after prolonged exertions and recently had a lung function test that was reported by her to be within normal limits. Her CBC on 07/07/13 revealed a hemoglobin of 9.9 compared to 10.4 two years ago.  Her creatinine was 1.2 on 07/07/13.  PMH: Past Medical History  Diagnosis Date  . ANEMIA 08/22/2009  . Unspecified essential hypertension 10/19/2007  . CAROTID ARTERY STENOSIS 01/16/2010  . PVD 10/06/2007  . HEMORRHOIDS, INTERNAL     surgery was in the 90's (per patient)  . GEN OSTEOARTHROSIS INVOLVING MULTIPLE SITES 11/11/2008  . NECK PAIN, ACUTE 12/28/2009  . PERIPHERAL EDEMA 10/06/2007  . DYSPNEA ON EXERTION 11/16/2007    Past Surgical History  Procedure Laterality Date  .  Hyperplastic colon polyps, removed  2007    By Dr. Evette Cristal  . Oophorectomy    . Rotator cuff repair  2004    left (Dr. Cleophas Dunker)  . Abdominal hysterectomy    . Joint replacement  2011    right knee    Allergies: Allergies  Allergen Reactions  . Sulfonamide Derivatives     Medications: Current outpatient prescriptions:Ascorbic Acid (VITAMIN C) 500 MG tablet, Take 500 mg by mouth daily.  , Disp: , Rfl: ;  aspirin 81 MG tablet, Take 81 mg by mouth daily., Disp: , Rfl: ;  Calcium Carbonate-Vit D-Min (CALCIUM 600 + MINERALS) 600-200 MG-UNIT TABS, Take by mouth daily.  , Disp: , Rfl: ;  carvedilol (COREG) 12.5 MG tablet, TAKE 1 TABLET BY MOUTH 2 TIMES DAILY WITH A MEAL., Disp: 360 tablet, Rfl: 2 furosemide (LASIX) 20 MG tablet, Take 1 tablet (20 mg total) by mouth daily., Disp: 30 tablet, Rfl: 3;  Glucosamine-Chondroit-Vit C-Mn (GLUCOSAMINE 1500 COMPLEX) CAPS, Take by mouth daily.  , Disp: , Rfl: ;  meloxicam (MOBIC) 15 MG tablet, TAKE 1 TABLET (15 MG TOTAL) BY MOUTH DAILY., Disp: 90 tablet, Rfl: 3;  omeprazole (PRILOSEC) 40 MG capsule, Take 40 mg by mouth daily.  , Disp: , Rfl:  vitamin E 400 UNIT capsule, Take 400 Units by mouth daily., Disp: , Rfl:    Social History:   reports that she has never smoked. She has never used smokeless tobacco. She reports that she does  not drink alcohol or use illicit drugs.  Family History: Family History  Problem Relation Age of Onset  . Ovarian cancer Mother   . Cancer Mother   . Leukemia Father   . Lung cancer Father   . Cancer Father   . Diabetes Neg Hx   . Heart disease Neg Hx   . Hypertension Neg Hx   . Cancer Brother     Review of Systems: Constitutional ROS:  No Fever  Chills, Night Sweats, Anorexia, Pain 0 out of 10 Cardiovascular ROS: positive for - dyspnea on exertion negative for - chest pain Respiratory ROS: no cough, shortness of breath, or wheezing Neurological ROS: no TIA or stroke symptoms Dermatological ROS: negative for  rash ENT ROS: negative for - epistaxis Gastrointestinal ROS: no abdominal pain, change in bowel habits, or black or bloody stools Genito-Urinary ROS: no dysuria, trouble voiding, or hematuria Hematological and Lymphatic ROS: negative for - bleeding problems Breast ROS: negative for breast lumps Musculoskeletal ROS: negative for - gait disturbance Remaining ROS negative.  Physical Exam: Blood pressure 170/57, pulse 72, resp. rate 18, height 5\' 4"  (1.626 m), weight 177 lb (80.287 kg), SpO2 100.00%. ECOG: 0 General appearance: alert, cooperative, appears stated age and no distress Head: Normocephalic, without obvious abnormality, atraumatic Neck: no adenopathy, supple, symmetrical, trachea midline and thyroid not enlarged, symmetric, no tenderness/mass/nodules Lymph nodes: Cervical, supraclavicular, and axillary nodes normal. HEENT: No sclerae icterus; PERRLA; EOMi Heart:regular rate and rhythm, S1, S2 normal, no murmur, click, rub or gallop Lung:chest clear, no wheezing, rales, normal symmetric air entry Abdomin: soft, non-tender, without masses or organomegaly EXT:No peripheral edema Neuro: No focal deficits Skin: No rashes  Lab Results: Lab Results  Component Value Date   WBC 7.9 08/05/2013   HGB 10.3* 08/05/2013   HCT 30.8* 08/05/2013   MCV 90.6 08/05/2013   PLT 215 08/05/2013     Chemistry      Component Value Date/Time   NA 142 08/05/2013 1202   NA 140 07/07/2013 1212   K 4.6 08/05/2013 1202   K 5.2* 07/07/2013 1212   CL 109 07/07/2013 1212   CO2 25 08/05/2013 1202   CO2 27 07/07/2013 1212   BUN 28.4* 08/05/2013 1202   BUN 30* 07/07/2013 1212   CREATININE 1.1 08/05/2013 1202   CREATININE 1.2 07/07/2013 1212      Component Value Date/Time   CALCIUM 9.2 08/05/2013 1202   CALCIUM 9.3 07/07/2013 1212   ALKPHOS 112 08/05/2013 1202   ALKPHOS 75 07/07/2013 1212   AST 23 08/05/2013 1202   AST 22 07/07/2013 1212   ALT 38 08/05/2013 1202   ALT 21 07/07/2013 1212   BILITOT 1.81* 08/05/2013  1202   BILITOT 1.9* 07/07/2013 1212     Iron/TIBC/Ferritin    Component Value Date/Time   IRON 107 08/05/2013 1202   IRON 95 08/22/2009 1131   TIBC 248 08/05/2013 1202   FERRITIN 230 08/05/2013 1202   Results for Claudia Lara, Claudia Lara (MRN [id number]) as of 08/08/2013 23:43  Ref. Range 06/02/2009 17:04 08/28/2009 14:01 10/19/2010 11:24 12/02/2011 12:12 07/16/2012 10:15 07/07/2013 12:12 08/05/2013 12:02  Total Bilirubin Latest Range: 0.3-1.2 mg/dL 1.4 (H) 1.5 (H) 1.5 (H) 1.6 (H) 1.5 (H) 1.9 (H) 1.81 (H)    Results for Claudia Lara, Claudia Lara (MRN [id number]) as of 08/08/2013 23:43  Ref. Range 08/05/2013 12:02  Retic % Latest Range: 0.70-2.10 % 6.51 (H)   Results for Claudia Lara, Claudia Lara (MRN [id number]) as of 08/08/2013 23:43  Ref. Range 08/05/2013 12:02  Retic % Latest Range: 0.70-2.10 % 6.51 (H)   Radiological Studies: None  Impression and Plan: 1. Anemia NOS - Her hemogloblin  Remains in the 10 range.  Her iron studies are negative for iron deficiency.  Her retic count % is 6% less than 9% more typical of hemolysis.   Her LDH is normal.  We will add on haptogloblin and if  Normal, negative LDH/haptogloblin is 90% specific to rule out hemolytic anemia.   We will fractionate the total bilirubin to ascertain indirect/direct.  With both her platlets and WBC being normal points against a primary bone marrow disorder.  With normal LFTs and no reported history of hepatic injury, primary liver disease is less likely.    2. Hyperbilirubinemia.  Likely secondary to #1.    3. Followup.  RTC in one month with repeat laboratories including LDH, haptogloblin, DAT.      Macoy Rodwell, MD

## 2013-08-16 ENCOUNTER — Encounter: Payer: Self-pay | Admitting: Internal Medicine

## 2013-08-23 ENCOUNTER — Telehealth: Payer: Self-pay | Admitting: Internal Medicine

## 2013-08-23 ENCOUNTER — Encounter: Payer: Self-pay | Admitting: Internal Medicine

## 2013-08-23 NOTE — Telephone Encounter (Signed)
PT CALL AND WANTED TO CX APPTS DUE TO NOT GETTING CALL ABOUT LAB RESULTS

## 2013-08-30 ENCOUNTER — Encounter: Payer: Self-pay | Admitting: Internal Medicine

## 2013-09-02 ENCOUNTER — Ambulatory Visit: Payer: Medicare Other

## 2013-09-30 ENCOUNTER — Other Ambulatory Visit: Payer: Self-pay

## 2013-10-16 ENCOUNTER — Encounter: Payer: Self-pay | Admitting: Internal Medicine

## 2013-10-18 NOTE — Telephone Encounter (Signed)
Flu info added to immunization record

## 2013-12-07 ENCOUNTER — Other Ambulatory Visit: Payer: Self-pay | Admitting: Internal Medicine

## 2014-01-03 ENCOUNTER — Other Ambulatory Visit: Payer: Self-pay

## 2014-01-03 MED ORDER — FUROSEMIDE 20 MG PO TABS: 20.0000 mg | ORAL_TABLET | Freq: Every day | ORAL | Status: DC

## 2014-01-20 ENCOUNTER — Ambulatory Visit: Payer: Medicare Other | Admitting: Internal Medicine

## 2014-02-01 ENCOUNTER — Ambulatory Visit (INDEPENDENT_AMBULATORY_CARE_PROVIDER_SITE_OTHER): Payer: Medicare Other | Admitting: Internal Medicine

## 2014-02-01 ENCOUNTER — Encounter: Payer: Self-pay | Admitting: Internal Medicine

## 2014-02-01 VITALS — BP 140/70 | HR 76 | Temp 97.8°F | Resp 14 | Ht 63.0 in | Wt 176.0 lb

## 2014-02-01 DIAGNOSIS — R0609 Other forms of dyspnea: Secondary | ICD-10-CM

## 2014-02-01 DIAGNOSIS — D649 Anemia, unspecified: Secondary | ICD-10-CM

## 2014-02-01 DIAGNOSIS — I10 Essential (primary) hypertension: Secondary | ICD-10-CM

## 2014-02-01 DIAGNOSIS — Z23 Encounter for immunization: Secondary | ICD-10-CM

## 2014-02-01 DIAGNOSIS — R0989 Other specified symptoms and signs involving the circulatory and respiratory systems: Secondary | ICD-10-CM

## 2014-02-01 NOTE — Patient Instructions (Signed)
Thanks for coming in.  We do not have a complete answer as to why your legs are weak and limit your activity. You did have a test of circulation in 2011 that was normal (ABI). Your pulmonary function study did reveal mild to moderate decrease in diffusion capacity = senile emphysema which will limit the ability to exchange CO2 and oxygen. You do have a chronic anemia that is not completely explained. You have normal iron stores and no evidence of bleeding. Your hemoglobin has been stable, but low, for several years.   The combination of decreased gas exchange and decreased oxygen carrying capacity limits the amount of oxygen that can get to the working muscles of your legs leading to muscle fatigue - legs that feel like lead.  Plan - try to improve hemoglobin. I will send a message to Dr. Roby Lofts if he is still there about this.   Immunizations - with Pneumovax in 2007 or earlier you should have the Prevnar 13 pneumonia vaccine today.  For continuing care I recommend you see Dr. Ronnald Ramp.

## 2014-02-01 NOTE — Progress Notes (Signed)
Pre visit review using our clinic review tool, if applicable. No additional management support is needed unless otherwise documented below in the visit note. 

## 2014-02-01 NOTE — Progress Notes (Signed)
Subjective:    Patient ID: Claudia Lara, female    DOB: [birth year], 80 y.o.   MRN: [id number]  HPI Mrs. Langhans returns for follow up of claudication/leg weakness with exertion. We reviewed all of her recent labs, Dr. Freddie Apley notes, PFTs. Searched in vain for ABI test and could not find colonoscopy report from [facility]. She does continue to have weakness in her legs.  She is active and comfortable most of the time. She has no other complaints.  Past Medical History  Diagnosis Date  . ANEMIA 08/22/2009  . Unspecified essential hypertension 10/19/2007  . CAROTID ARTERY STENOSIS 01/16/2010  . PVD 10/06/2007  . HEMORRHOIDS, INTERNAL     surgery was in the 90's (per patient)  . GEN OSTEOARTHROSIS INVOLVING MULTIPLE SITES 11/11/2008  . NECK PAIN, ACUTE 12/28/2009  . PERIPHERAL EDEMA 10/06/2007  . DYSPNEA ON EXERTION 11/16/2007   Past Surgical History  Procedure Laterality Date  . Hyperplastic colon polyps, removed  2007    By Dr. Penelope Coop  . Oophorectomy    . Rotator cuff repair  2004    left (Dr. Durward Fortes)  . Abdominal hysterectomy    . Joint replacement  2011    right knee   Family History  Problem Relation Age of Onset  . Ovarian cancer Mother   . Cancer Mother   . Leukemia Father   . Lung cancer Father   . Cancer Father   . Diabetes Neg Hx   . Heart disease Neg Hx   . Hypertension Neg Hx   . Cancer Brother    History   Social History  . Marital Status: Divorced    Spouse Name: N/A    Number of Children: N/A  . Years of Education: 12   Occupational History  . Network engineer     retired   Social History Main Topics  . Smoking status: Never Smoker   . Smokeless tobacco: Never Used  . Alcohol Use: No  . Drug Use: No  . Sexual Activity: Not on file   Other Topics Concern  . Not on file   Social History Narrative   [employer]. Married - divorced for many years.  Retired. Lives alone - very active and independent.     Current Outpatient Prescriptions on File Prior to  Visit  Medication Sig Dispense Refill  . Ascorbic Acid (VITAMIN C) 500 MG tablet Take 500 mg by mouth daily.        [name] aspirin 81 MG tablet Take 81 mg by mouth daily.      . Calcium Carbonate-Vit D-Min (CALCIUM 600 + MINERALS) 600-200 MG-UNIT TABS Take by mouth daily.        . carvedilol (COREG) 12.5 MG tablet TAKE 1 TABLET BY MOUTH 2 TIMES DAILY WITH A MEAL.  360 tablet  2  . furosemide (LASIX) 20 MG tablet Take 1 tablet (20 mg total) by mouth daily.  90 tablet  3  . Glucosamine-Chondroit-Vit C-Mn (GLUCOSAMINE 1500 COMPLEX) CAPS Take by mouth daily.        . meloxicam (MOBIC) 15 MG tablet TAKE 1 TABLET (15 MG TOTAL) BY MOUTH DAILY.  90 tablet  3  . omeprazole (PRILOSEC) 40 MG capsule Take 40 mg by mouth daily.        . vitamin E 400 UNIT capsule Take 400 Units by mouth daily.       No current facility-administered medications on file prior to visit.      Review of Systems System review is  negative for any constitutional, cardiac, pulmonary, GI or neuro symptoms or complaints other than as described in the HPI.     Objective:   Physical Exam Filed Vitals:   02/01/14 1430  BP: 140/70  Pulse: 76  Temp: 97.8 F (36.6 C)  Resp: 14   Wt Readings from Last 3 Encounters:  02/01/14 176 lb (79.833 kg)  08/05/13 177 lb (80.287 kg)  07/07/13 177 lb (80.287 kg)   BP Readings from Last 3 Encounters:  02/01/14 140/70  08/05/13 170/57  07/07/13 144/72   Gen'l- WNWD woman in no distress Cor- RRR Pulm - normal respirations Neuro - A&O x 3       Assessment & Plan:

## 2014-02-02 ENCOUNTER — Other Ambulatory Visit: Payer: Self-pay | Admitting: Internal Medicine

## 2014-02-02 NOTE — Assessment & Plan Note (Signed)
BP Readings from Last 3 Encounters:  02/01/14 140/70  08/05/13 170/57  07/07/13 144/72   Adequate control - will continue present regimen

## 2014-02-02 NOTE — Assessment & Plan Note (Signed)
No change in treatment. She did not follow up with hematology - never heard back about her labs.  Plan Will ask hematology about follow up.

## 2014-02-02 NOTE — Assessment & Plan Note (Signed)
We do not have a complete answer as to why your legs are weak and limit your activity. You did have a test of circulation in 2011 that was normal (ABI). Your pulmonary function study did reveal mild to moderate decrease in diffusion capacity = senile emphysema which will limit the ability to exchange CO2 and oxygen. You do have a chronic anemia that is not completely explained. You have normal iron stores and no evidence of bleeding. Your hemoglobin has been stable, but low, for several years.   The combination of decreased gas exchange and decreased oxygen carrying capacity limits the amount of oxygen that can get to the working muscles of your legs leading to muscle fatigue - legs that feel like lead.  Plan - try to improve hemoglobin. I will send a message to Dr. Roby Lofts if he is still there about this.

## 2014-02-03 ENCOUNTER — Telehealth: Payer: Self-pay

## 2014-02-03 ENCOUNTER — Telehealth: Payer: Self-pay | Admitting: Internal Medicine

## 2014-02-03 NOTE — Telephone Encounter (Signed)
s.w. pt and advised n March appt....pt ok adn aware of d.t for appt

## 2014-02-14 ENCOUNTER — Telehealth: Payer: Self-pay | Admitting: Internal Medicine

## 2014-02-14 ENCOUNTER — Ambulatory Visit (HOSPITAL_BASED_OUTPATIENT_CLINIC_OR_DEPARTMENT_OTHER): Payer: Medicare Other | Admitting: Internal Medicine

## 2014-02-14 ENCOUNTER — Ambulatory Visit (HOSPITAL_BASED_OUTPATIENT_CLINIC_OR_DEPARTMENT_OTHER): Payer: Medicare Other

## 2014-02-14 VITALS — BP 151/42 | HR 85 | Temp 98.0°F | Resp 18 | Ht 63.0 in | Wt 175.5 lb

## 2014-02-14 DIAGNOSIS — R17 Unspecified jaundice: Secondary | ICD-10-CM

## 2014-02-14 DIAGNOSIS — D649 Anemia, unspecified: Secondary | ICD-10-CM

## 2014-02-14 LAB — COMPREHENSIVE METABOLIC PANEL (CC13)
ALBUMIN: 4.1 g/dL (ref 3.5–5.0)
ALT: 15 U/L (ref 0–55)
ANION GAP: 9 meq/L (ref 3–11)
AST: 18 U/L (ref 5–34)
Alkaline Phosphatase: 80 U/L (ref 40–150)
BUN: 29.8 mg/dL — AB (ref 7.0–26.0)
CO2: 25 mEq/L (ref 22–29)
Calcium: 9.2 mg/dL (ref 8.4–10.4)
Chloride: 110 mEq/L — ABNORMAL HIGH (ref 98–109)
Creatinine: 1.1 mg/dL (ref 0.6–1.1)
GLUCOSE: 96 mg/dL (ref 70–140)
POTASSIUM: 4.9 meq/L (ref 3.5–5.1)
SODIUM: 143 meq/L (ref 136–145)
TOTAL PROTEIN: 6.4 g/dL (ref 6.4–8.3)
Total Bilirubin: 1.54 mg/dL — ABNORMAL HIGH (ref 0.20–1.20)

## 2014-02-14 LAB — IRON AND TIBC CHCC
%SAT: 48 % (ref 21–57)
Iron: 105 ug/dL (ref 41–142)
TIBC: 221 ug/dL — ABNORMAL LOW (ref 236–444)
UIBC: 116 ug/dL — ABNORMAL LOW (ref 120–384)

## 2014-02-14 LAB — CBC & DIFF AND RETIC
BASO%: 0.5 % (ref 0.0–2.0)
Basophils Absolute: 0 10*3/uL (ref 0.0–0.1)
EOS ABS: 0.3 10*3/uL (ref 0.0–0.5)
EOS%: 4.4 % (ref 0.0–7.0)
HEMATOCRIT: 29.8 % — AB (ref 34.8–46.6)
HGB: 10.1 g/dL — ABNORMAL LOW (ref 11.6–15.9)
Immature Retic Fract: 4.5 % (ref 1.60–10.00)
LYMPH%: 14.9 % (ref 14.0–49.7)
MCH: 30.9 pg (ref 25.1–34.0)
MCHC: 33.9 g/dL (ref 31.5–36.0)
MCV: 91.1 fL (ref 79.5–101.0)
MONO#: 0.6 10*3/uL (ref 0.1–0.9)
MONO%: 9.5 % (ref 0.0–14.0)
NEUT#: 4.6 10*3/uL (ref 1.5–6.5)
NEUT%: 70.7 % (ref 38.4–76.8)
Platelets: 192 10*3/uL (ref 145–400)
RBC: 3.27 10*6/uL — ABNORMAL LOW (ref 3.70–5.45)
RDW: 18.3 % — ABNORMAL HIGH (ref 11.2–14.5)
Retic %: 7.1 % — ABNORMAL HIGH (ref 0.70–2.10)
Retic Ct Abs: 232.17 10*3/uL — ABNORMAL HIGH (ref 33.70–90.70)
WBC: 6.4 10*3/uL (ref 3.9–10.3)
lymph#: 1 10*3/uL (ref 0.9–3.3)

## 2014-02-14 LAB — FERRITIN CHCC: Ferritin: 185 ng/ml (ref 9–269)

## 2014-02-14 LAB — LACTATE DEHYDROGENASE (CC13): LDH: 224 U/L (ref 125–245)

## 2014-02-14 NOTE — Telephone Encounter (Signed)
gv and printed appt sched and avs for pt for April....sent pt to lab °

## 2014-02-15 LAB — BILIRUBIN, DIRECT: Bilirubin, Direct: 0.3 mg/dL (ref 0.0–0.3)

## 2014-02-15 NOTE — Progress Notes (Signed)
[place] OFFICE PROGRESS NOTE  Claudia Calico, MD [facility] 1st Floor [place] [address]  DIAGNOSIS: ANEMIA - Plan: CBC & Diff and Retic, Comprehensive metabolic panel (Cmet) - [facility], Lactate dehydrogenase (LDH) - [facility], Sedimentation rate, Ferritin, Iron and TIBC, Erythropoietin, Haptoglobin  Chief Complaint  Patient presents with  . Anemia    CURRENT TREATMENT:  Ferrous sulfate 325 bid.   INTERVAL HISTORY: Claudia Lara 80 y.o. female with a history of hypertension who presented on  08/05/2013 for further evaluation of anemia by her PCP Dr. Linda Hedges is here for follow-up.  She cancelled her one-month follow-up and was instructed by her PCP to reschedule a follow up today.   Initially, she reports taking supplemental iron from time to time with her first low hemoglobin about 5 years ago. She reported recently having increased dyspnea on exertion. She used to walk about a mile per day but quit about 2 years ago due to increased DOE. She denied a history of blood transfusions outside of when she received packed red blood cells intraoperatively in November 2011 during her right knee replacement surgery. She denied a history of blood clots. She reported being up-to-date on her screening exams with her last colonoscopy at age 66. Her mammogram was done on 08/18/2013 and was negative.   Today, she denies chest pain, palpitations, picca. However, earlier in August, she did have pain in the center of her chest along with diaphoresis. In 2007, She had a myoview with an EF of 70% without ST-T wave changes. She denies a recent history of bleeding problems or recurrent infections. She reports feeling fatigued after prolonged exertions and recently had a lung function test that was reported by her to be within normal limits. Her CBC on 07/07/13 revealed a hemoglobin of 9.9 compared to 10.4 two years ago. Her creatinine was 1.2 on 07/07/13. She continues to report fatigue.   She was  recently seen for lower extremity weakness.  Her ABI was normal in 20111.   She denies any hospitalizations or emergency room visits.    MEDICAL HISTORY: Past Medical History  Diagnosis Date  . ANEMIA 08/22/2009  . Unspecified essential hypertension 10/19/2007  . CAROTID ARTERY STENOSIS 01/16/2010  . PVD 10/06/2007  . HEMORRHOIDS, INTERNAL     surgery was in the 90's (per patient)  . GEN OSTEOARTHROSIS INVOLVING MULTIPLE SITES 11/11/2008  . NECK PAIN, ACUTE 12/28/2009  . PERIPHERAL EDEMA 10/06/2007  . DYSPNEA ON EXERTION 11/16/2007    INTERIM HISTORY: has ANEMIA; Unspecified essential hypertension; CAROTID ARTERY STENOSIS; PVD; HEMORRHOIDS, INTERNAL; GEN OSTEOARTHROSIS INVOLVING MULTIPLE SITES; NECK PAIN, ACUTE; PERIPHERAL EDEMA; DYSPNEA ON EXERTION; Routine health maintenance; Need for prophylactic vaccination with tetanus-diphtheria (TD); and Chest pain with high risk for cardiac etiology on her problem list.    ALLERGIES:  is allergic to sulfonamide derivatives.  MEDICATIONS: has a current medication list which includes the following prescription(s): vitamin c, aspirin, calcium 600 + minerals, carvedilol, furosemide, glucosamine 1500 complex, meloxicam, omeprazole, and vitamin e.  SURGICAL HISTORY:  Past Surgical History  Procedure Laterality Date  . Hyperplastic colon polyps, removed  2007    By Dr. Penelope Coop  . Oophorectomy    . Rotator cuff repair  2004    left (Dr. Durward Fortes)  . Abdominal hysterectomy    . Joint replacement  2011    right knee    REVIEW OF SYSTEMS:   Constitutional: Denies fevers, chills or abnormal weight loss Eyes: Denies blurriness of vision Ears,  nose, mouth, throat, and face: Denies mucositis or sore throat Respiratory: Denies cough, dyspnea or wheezes Cardiovascular: Denies palpitation, chest discomfort or lower extremity swelling Gastrointestinal:  Denies nausea, heartburn or change in bowel habits Skin: Denies abnormal skin rashes Lymphatics: Denies  new lymphadenopathy or easy bruising Neurological:Denies numbness, tingling or new weaknesses Behavioral/Psych: Mood is stable, no new changes  All other systems were reviewed with the patient and are negative.  PHYSICAL EXAMINATION: ECOG PERFORMANCE STATUS: 1 - Symptomatic but completely ambulatory  Blood pressure 151/42, pulse 85, temperature 98 F (36.7 C), temperature source Oral, resp. rate 18, height 5\' 3"  (1.6 m), weight 175 lb 8 oz (79.606 kg), SpO2 95.00%.  GENERAL:alert, no distress and comfortable SKIN: skin color, texture, turgor are normal, no rashes or significant lesions EYES: normal, Conjunctiva are pink and non-injected, sclera clear OROPHARYNX:no exudate, no erythema and lips, buccal mucosa, and tongue normal  NECK: supple, thyroid normal size, non-tender, without nodularity LYMPH:  no palpable lymphadenopathy in the cervical, axillary or supraclavicular LUNGS: clear to auscultation with normal breathing effort, no wheezes or rhonchi HEART: regular rate & rhythm and no murmurs and no lower extremity edema ABDOMEN:abdomen soft, non-tender and normal bowel sounds Musculoskeletal:no cyanosis of digits and no clubbing ; severe athritic changes in the hands.  NEURO: alert & oriented x 3 with fluent speech, no focal motor/sensory deficits  Labs:  Lab Results  Component Value Date   WBC 6.4 02/14/2014   HGB 10.1* 02/14/2014   HCT 29.8* 02/14/2014   MCV 91.1 02/14/2014   PLT 192 02/14/2014   NEUTROABS 4.6 02/14/2014      Chemistry      Component Value Date/Time   NA 143 02/14/2014 1057   NA 140 07/07/2013 1212   K 4.9 02/14/2014 1057   K 5.2* 07/07/2013 1212   CL 109 07/07/2013 1212   CO2 25 02/14/2014 1057   CO2 27 07/07/2013 1212   BUN 29.8* 02/14/2014 1057   BUN 30* 07/07/2013 1212   CREATININE 1.1 02/14/2014 1057   CREATININE 1.2 07/07/2013 1212      Component Value Date/Time   CALCIUM 9.2 02/14/2014 1057   CALCIUM 9.3 07/07/2013 1212   ALKPHOS 80 02/14/2014 1057    ALKPHOS 75 07/07/2013 1212   AST 18 02/14/2014 1057   AST 22 07/07/2013 1212   ALT 15 02/14/2014 1057   ALT 21 07/07/2013 1212   BILITOT 1.54* 02/14/2014 1057   BILITOT 1.9* 07/07/2013 1212       Basic Metabolic Panel:  Recent Labs Lab 02/14/14 1057  NA 143  K 4.9  CO2 25  GLUCOSE 96  BUN 29.8*  CREATININE 1.1  CALCIUM 9.2   GFR Estimated Creatinine Clearance: 41.4 ml/min (by C-G formula based on Cr of 1.1). Liver Function Tests:  Recent Labs Lab 02/14/14 1057  AST 18  ALT 15  ALKPHOS 80  BILITOT 1.54*  PROT 6.4  ALBUMIN 4.1   No results found for this basename: LIPASE, AMYLASE,  in the last 168 hours No results found for this basename: AMMONIA,  in the last 168 hours Coagulation profile No results found for this basename: INR, PROTIME,  in the last 168 hours  CBC:  Recent Labs Lab 02/14/14 1057  WBC 6.4  NEUTROABS 4.6  HGB 10.1*  HCT 29.8*  MCV 91.1  PLT 192    Anemia work up  Recent Labs  02/14/14 1057  FERRITIN 185  TIBC 221*  IRON 105   Results for Lilja, Timaya P (  MRN [id number]) as of 02/15/2014 11:15  Ref. Range 02/14/2014 10:57  LDH Latest Range: 125-245 U/L 224   Results for QUANITA, BARONA (MRN [id number]) as of 02/15/2014 11:15  Ref. Range 08/05/2013 12:02  Folate No range found >20.0  Results for ZAN, TRISKA (MRN [id number]) as of 02/15/2014 11:15  Ref. Range 08/05/2013 12:02  Vitamin B-12 Latest Range: 211-911 pg/mL 742   Studies:  No results found.   RADIOGRAPHIC STUDIES: No results found.  ASSESSMENT: CRICKETT ABBETT 80 y.o. female with a history of ANEMIA - Plan: CBC & Diff and Retic, Comprehensive metabolic panel (Cmet) - [facility], Lactate dehydrogenase (LDH) - [facility], Sedimentation rate, Ferritin, Iron and TIBC, Erythropoietin, Haptoglobin   PLAN:  1. Normocytic anemia  - Her hemogloblin Remains in the 10 range. Her iron studies are negative for iron deficiency. Her prior retic count % is 6% less than 9% more typical of hemolysis.   Her LDH was normal which points away from hemolysis.  Her repeat hemolysis studies including haptoglobin is 31 (low) and elevated retic count points toward hemolysis.   --Other considerations for her anemia include anemia of chronic inflammation.  This is consistent with decreased TIBC.  We will check an erythropoietin level and if less than 500 mU/mL, she might benefit from epo shots.  Sincer her ferritin is greater than 100 and her Fe/TIBC is greater than 20%, she is less likely to benefit from iron supplementation.   --Other considerations also include a primary bone marrow problem given her age.  With both her platlets and WBC being normal points against a primary bone marrow disorder. With normal LFTs and no reported history of hepatic injury, primary liver disease is also less likely.  Her creatinine clearance is greater than 30 mL/min which typically suggest anemia of chronic kidney disease.  We will check an epo level nevertheless.  2. Hyperbilirubinemia. Likely secondary to #1.  3. Followup. RTC in two weeks with repeat laboratories.   All questions were answered. The patient knows to call the clinic with any problems, questions or concerns. We can certainly see the patient much sooner if necessary.  I spent 15 minutes counseling the patient face to face. The total time spent in the appointment was 25 minutes.    Ashly Goethe, MD 02/15/2014 10:40 AM

## 2014-02-16 LAB — SEDIMENTATION RATE: SED RATE: 4 mm/h (ref 0–22)

## 2014-02-16 LAB — ERYTHROPOIETIN: Erythropoietin: 48.5 m[IU]/mL — ABNORMAL HIGH (ref 2.6–18.5)

## 2014-02-16 LAB — HAPTOGLOBIN: Haptoglobin: 31 mg/dL — ABNORMAL LOW (ref 45–215)

## 2014-03-09 ENCOUNTER — Telehealth: Payer: Self-pay

## 2014-03-09 NOTE — Telephone Encounter (Signed)
Sent Dr. Ronnald Ramp 20 pages on the dumbwaiter of chart abstraction from Dr. Ubaldo Glassing chart.  03/09/2014  ltd

## 2014-03-10 ENCOUNTER — Telehealth: Payer: Self-pay | Admitting: Internal Medicine

## 2014-03-10 ENCOUNTER — Ambulatory Visit: Payer: Medicare Other

## 2014-03-10 ENCOUNTER — Encounter: Payer: Self-pay | Admitting: Internal Medicine

## 2014-03-10 ENCOUNTER — Ambulatory Visit (HOSPITAL_BASED_OUTPATIENT_CLINIC_OR_DEPARTMENT_OTHER): Payer: Medicare Other | Admitting: Internal Medicine

## 2014-03-10 ENCOUNTER — Ambulatory Visit (HOSPITAL_BASED_OUTPATIENT_CLINIC_OR_DEPARTMENT_OTHER): Payer: Medicare Other

## 2014-03-10 VITALS — BP 144/56 | HR 80 | Temp 97.0°F | Resp 18 | Ht 63.0 in | Wt 177.3 lb

## 2014-03-10 DIAGNOSIS — D649 Anemia, unspecified: Secondary | ICD-10-CM

## 2014-03-10 LAB — CBC & DIFF AND RETIC
BASO%: 0.5 % (ref 0.0–2.0)
Basophils Absolute: 0 10*3/uL (ref 0.0–0.1)
EOS%: 5.2 % (ref 0.0–7.0)
Eosinophils Absolute: 0.4 10*3/uL (ref 0.0–0.5)
HEMATOCRIT: 29.9 % — AB (ref 34.8–46.6)
HGB: 10 g/dL — ABNORMAL LOW (ref 11.6–15.9)
Immature Retic Fract: 6.6 % (ref 1.60–10.00)
LYMPH%: 16.8 % (ref 14.0–49.7)
MCH: 30.7 pg (ref 25.1–34.0)
MCHC: 33.4 g/dL (ref 31.5–36.0)
MCV: 91.7 fL (ref 79.5–101.0)
MONO#: 0.6 10*3/uL (ref 0.1–0.9)
MONO%: 8.5 % (ref 0.0–14.0)
NEUT#: 5 10*3/uL (ref 1.5–6.5)
NEUT%: 69 % (ref 38.4–76.8)
PLATELETS: 212 10*3/uL (ref 145–400)
RBC: 3.26 10*6/uL — AB (ref 3.70–5.45)
RDW: 18.5 % — ABNORMAL HIGH (ref 11.2–14.5)
RETIC %: 7.01 % — AB (ref 0.70–2.10)
Retic Ct Abs: 228.53 10*3/uL — ABNORMAL HIGH (ref 33.70–90.70)
WBC: 7.3 10*3/uL (ref 3.9–10.3)
lymph#: 1.2 10*3/uL (ref 0.9–3.3)

## 2014-03-10 LAB — CHCC SMEAR

## 2014-03-10 LAB — LACTATE DEHYDROGENASE (CC13): LDH: 242 U/L (ref 125–245)

## 2014-03-10 NOTE — Progress Notes (Signed)
[place] OFFICE PROGRESS NOTE  Claudia Calico, MD [facility] 1st Floor [place] [address]  DIAGNOSIS: ANEMIA - Plan: CBC & Diff and Retic, Smear, Lactate dehydrogenase (LDH) - [facility], Haptoglobin, Direct antiglobulin test, CBC with Differential, Lactate dehydrogenase (LDH) - [facility], Haptoglobin, CBC with Differential  Chief Complaint  Patient presents with  . Anemia    CURRENT TREATMENT:  None.  INTERVAL HISTORY: Claudia Lara 80 y.o. female with a history of hypertension who presented on  08/05/2013 for further evaluation of anemia by her PCP Dr. Linda Hedges is here for follow-up.  She was seen by me on 02/14/2014.  Initially, she reports taking supplemental iron from time to time with her first low hemoglobin about 5 years ago. She reported recently having increased dyspnea on exertion. She used to walk about a mile per day but quit about 2 years ago due to increased DOE. She denied a history of blood transfusions outside of when she received packed red blood cells intraoperatively in November 2011 during her right knee replacement surgery. She denied a history of blood clots. She reported being up-to-date on her screening exams with her last colonoscopy at age 72. Her mammogram was done on 08/18/2013 and was negative.   Today, she is feeling fine.  She denies a recent history of bleeding problems or recurrent infections. She reports feeling fatigued after prolonged exertions like extended walking or going shopping. As reported previously, she recently had a lung function test that was reported by her to be within normal limits with the exception of "senile " emphysema. . Her CBC on 07/07/13 revealed a hemoglobin of 9.9 compared to 10.4 two years ago. Her creatinine was 1.2 on 07/07/13. She continues to report fatigue.   She was recently seen for lower extremity weakness.  Her ABI was normal in 20111.   She denies any hospitalizations or emergency room visits.    MEDICAL  HISTORY: Past Medical History  Diagnosis Date  . ANEMIA 08/22/2009  . Unspecified essential hypertension 10/19/2007  . CAROTID ARTERY STENOSIS 01/16/2010  . PVD 10/06/2007  . HEMORRHOIDS, INTERNAL     surgery was in the 90's (per patient)  . GEN OSTEOARTHROSIS INVOLVING MULTIPLE SITES 11/11/2008  . NECK PAIN, ACUTE 12/28/2009  . PERIPHERAL EDEMA 10/06/2007  . DYSPNEA ON EXERTION 11/16/2007    INTERIM HISTORY: has ANEMIA; Unspecified essential hypertension; CAROTID ARTERY STENOSIS; PVD; HEMORRHOIDS, INTERNAL; GEN OSTEOARTHROSIS INVOLVING MULTIPLE SITES; NECK PAIN, ACUTE; PERIPHERAL EDEMA; DYSPNEA ON EXERTION; Routine health maintenance; Need for prophylactic vaccination with tetanus-diphtheria (TD); and Chest pain with high risk for cardiac etiology on her problem list.    ALLERGIES:  is allergic to sulfonamide derivatives.  MEDICATIONS: has a current medication list which includes the following prescription(s): vitamin c, calcium 600 + minerals, carvedilol, furosemide, glucosamine 1500 complex, meloxicam, and omeprazole.  SURGICAL HISTORY:  Past Surgical History  Procedure Laterality Date  . Hyperplastic colon polyps, removed  2007    By Dr. Penelope Coop  . Oophorectomy    . Rotator cuff repair  2004    left (Dr. Durward Fortes)  . Abdominal hysterectomy    . Joint replacement  2011    right knee    REVIEW OF SYSTEMS:   Constitutional: Denies fevers, chills or abnormal weight loss Eyes: Denies blurriness of vision Ears, nose, mouth, throat, and face: Denies mucositis or sore throat Respiratory: Denies cough, dyspnea or wheezes Cardiovascular: Denies palpitation, chest discomfort or lower extremity swelling Gastrointestinal:  Denies nausea, heartburn or  change in bowel habits Skin: Denies abnormal skin rashes Lymphatics: Denies new lymphadenopathy or easy bruising Neurological:Denies numbness, tingling or new weaknesses Behavioral/Psych: Mood is stable, no new changes  All other systems  were reviewed with the patient and are negative.  PHYSICAL EXAMINATION: ECOG PERFORMANCE STATUS: 1 - Symptomatic but completely ambulatory  Blood pressure 144/56, pulse 80, temperature 97 F (36.1 C), temperature source Oral, resp. rate 18, height 5' 3"  (1.6 m), weight 177 lb 4.8 oz (80.423 kg).  GENERAL:alert, no distress and comfortable SKIN: skin color, texture, turgor are normal, no rashes or significant lesions EYES: normal, Conjunctiva are pink and non-injected, sclera clear OROPHARYNX:no exudate, no erythema and lips, buccal mucosa, and tongue normal  NECK: supple, thyroid normal size, non-tender, without nodularity LYMPH:  no palpable lymphadenopathy in the cervical, axillary or supraclavicular LUNGS: clear to auscultation with normal breathing effort, no wheezes or rhonchi HEART: regular rate & rhythm and no murmurs and no lower extremity edema ABDOMEN:abdomen soft, non-tender and normal bowel sounds Musculoskeletal:no cyanosis of digits and no clubbing ; severe athritic changes in the hands.  NEURO: alert & oriented x 3 with fluent speech, no focal motor/sensory deficits  Labs:  Lab Results  Component Value Date   WBC 7.3 03/10/2014   HGB 10.0* 03/10/2014   HCT 29.9* 03/10/2014   MCV 91.7 03/10/2014   PLT 212 03/10/2014   NEUTROABS 5.0 03/10/2014      Chemistry      Component Value Date/Time   NA 143 02/14/2014 1057   NA 140 07/07/2013 1212   K 4.9 02/14/2014 1057   K 5.2* 07/07/2013 1212   CL 109 07/07/2013 1212   CO2 25 02/14/2014 1057   CO2 27 07/07/2013 1212   BUN 29.8* 02/14/2014 1057   BUN 30* 07/07/2013 1212   CREATININE 1.1 02/14/2014 1057   CREATININE 1.2 07/07/2013 1212      Component Value Date/Time   CALCIUM 9.2 02/14/2014 1057   CALCIUM 9.3 07/07/2013 1212   ALKPHOS 80 02/14/2014 1057   ALKPHOS 75 07/07/2013 1212   AST 18 02/14/2014 1057   AST 22 07/07/2013 1212   ALT 15 02/14/2014 1057   ALT 21 07/07/2013 1212   BILITOT 1.54* 02/14/2014 1057   BILITOT 1.9*  07/07/2013 1212      CBC:  Recent Labs Lab 03/10/14 1532  WBC 7.3  NEUTROABS 5.0  HGB 10.0*  HCT 29.9*  MCV 91.7  PLT 212    Anemia work up No results found for this basename: VITAMINB12, FOLATE, FERRITIN, TIBC, IRON, RETICCTPCT,  in the last 72 hours Results for Claudia, Lara (MRN [id number]) as of 02/15/2014 11:15  Ref. Range 02/14/2014 10:57  LDH Latest Range: 125-245 U/L 224   Results for Claudia, Lara (MRN [id number]) as of 02/15/2014 11:15  Ref. Range 08/05/2013 12:02  Folate No range found >20.0  Results for Claudia, Lara (MRN [id number]) as of 02/15/2014 11:15  Ref. Range 08/05/2013 12:02  Vitamin B-12 Latest Range: 211-911 pg/mL 742   Results for Claudia, Lara (MRN [id number]) as of 03/10/2014 14:44  Ref. Range 02/14/2014 10:57  Haptoglobin Latest Range: 45-215 mg/dL 31 (L)   Results for Claudia, Lara (MRN [id number]) as of 03/10/2014 14:44  Ref. Range 02/14/2014 10:57  Retic % Latest Range: 0.70-2.10 % 7.10 (H)  Retic Ct Abs Latest Range: 33.70-90.70 10e3/uL 232.17 (H)   Results for Claudia, Lara (MRN [id number]) as of 03/10/2014 16:50  Ref. Range 03/10/2014 15:32  LDH Latest Range:  125-245 U/L 242  Results for Claudia, Lara (MRN [id number]) as of 03/10/2014 16:50  Ref. Range 02/14/2014 10:57  Sed Rate Latest Range: 0-22 mm/hr 4   Studies:  No results found.   RADIOGRAPHIC STUDIES: No results found.  ASSESSMENT: Claudia Lara 80 y.o. female with a history of ANEMIA - Plan: CBC & Diff and Retic, Smear, Lactate dehydrogenase (LDH) - [facility], Haptoglobin, Direct antiglobulin test, CBC with Differential, Lactate dehydrogenase (LDH) - [facility], Haptoglobin, CBC with Differential   PLAN:  1. Normocytic anemia NOS - Her hemogloblin Remains in the 10 range. Her iron studies are negative for iron deficiency. Her prior retic count % is 7% less than 9% more typical of hemolysis.  Her LDH was normal which points away from hemolysis.  Her repeat hemolysis studies including  haptoglobin is 31 (low) and elevated retic count points toward hemolysis.   --Other considerations for her anemia include anemia of chronic inflammation.  This is consistent with decreased TIBC but her ESR is normal which points against this.  We checked an erythropoietin level and it is less than 500 mU/mL, she might benefit from epo shots.  Sincer her ferritin is greater than 100 and her Fe/TIBC is greater than 20%, she is less likely to benefit from iron supplementation and has been instructed to stop.  --Other considerations also include a primary bone marrow problem given her age.  With both her platlets and WBC being normal points against a primary bone marrow disorder. With normal LFTs and no reported history of hepatic injury, primary liver disease is also less likely.  Her creatinine clearance is greater than 30 mL/min which typically suggestive of anemia of chronic kidney disease.   --She is scheduled for follow up colonoscopy this year with Dr. Ardis Hughs.  Prior EGD unrevealing with the exception of a hiatal hernia per patient.   2. Hyperbilirubinemia. Likely secondary to #1.   3. Followup. RTC in four weeks with repeat laboratories and in 2 weeks for repeat labs.  We will consider a trial of steroids based on further hemolysis work up.    All questions were answered. The patient knows to call the clinic with any problems, questions or concerns. We can certainly see the patient much sooner if necessary.  I spent 15 minutes counseling the patient face to face. The total time spent in the appointment was 25 minutes.    Concha Norway, MD 03/10/2014 4:49 PM

## 2014-03-10 NOTE — Patient Instructions (Signed)
Hemolytic Anemia Anemia is a condition in which you do not have enough red blood cells to carry oxygen throughout your body. Hemolytic anemia occurs when your red blood cells are being destroyed faster than they are being produced. Hemolytic anemia can affect people of all ages. It may worsen existing heart or lung disease. There are many types of hemolytic anemia, and they can be divided into two different groups: inherited and acquired. Inherited hemolytic anemia is due to a gene that your parents passed on to you. The abnormal cells may break down while moving through your circulatory system. Your spleen may remove the abnormal blood cells and debris from your blood stream. Acquired hemolytic anemia occurs when your red blood cells are destroyed either by certain medicines that you have used or as a result of infections or diseases that you have. CAUSES  Hemolytic anemia is caused by red blood cell destruction. Sometimes the reasons for the destruction are not clear. Known causes include:  Inherited disorders, such as sickle cell anemia and thalassemias.  Use of certain medicines.  Blood infection (septicemia).  Exposure to toxic chemicals or excessive radiation.  Reactions to blood transfusions.  Certain immune disorders.  Artificial heart valves.  Enlarged spleens. SYMPTOMS   Pale skin, eyes, and fingernails.  Irregular or fast heartbeat.  Headaches.  Tiredness (fatigue) and weakness.  Dizziness or fainting.  Shortness of breath.  Yellowing of the skin or eyes (jaundice).  Chest pain.  Cold hands and feet. DIAGNOSIS  Your health care provider will do a physical exam and ask questions about your symptoms. Blood tests, urine tests, and taking bone marrow tissue (biopsies) may be done to help find the cause of your anemia.  TREATMENT Treatment depends on the cause of your anemia. Treatment may include:  Medicines.  Blood transfusions.  Plasmapheresis.  Blood and  bone marrow stem cell transplant.  Surgery to remove the spleen. HOME CARE INSTRUCTIONS   Only take over-the-counter or prescription medicines as directed by your health care provider. If you are given antibiotics, take them as directed. Finish them even if you start to feel better.  Take over-the-counter iron supplements as directed by your health care provider.  Decrease the chances of getting sick by:  Washing your hands often.  Staying away from people who are sick.  Getting a flu shot and pneumonia shot if recommended by your health care provider.  Avoid certain kinds of foods that can expose you to bacteria, such as uncooked foods.  Keep all follow-up appointments with your health care provider. SEEK MEDICAL CARE IF:   You become dizzy or tired easily.  Your skin looks pale.  You feel your heart beating faster than normal.  You feel like your heart has skipped or stopped beats (irregular heartbeat). SEEK IMMEDIATE MEDICAL CARE IF:   Your skin and eyes turn yellow.  You develop chest pain.  You become short of breath.  You faint.  You develop an uncontrolled cough. MAKE SURE YOU:   Understand these instructions.  Will watch your condition.  Will get help right away if you are not doing well or get worse. Document Released: 11/11/2005 Document Revised: 07/14/2013 Document Reviewed: 03/31/2013 [facility] Patient Information 2014 [place]. Prednisone tablets What is this medicine? PREDNISONE (PRED ni sone) is a corticosteroid. It is commonly used to treat inflammation of the skin, joints, lungs, and other organs. Common conditions treated include asthma, allergies, and arthritis. It is also used for other conditions, such as blood disorders  and diseases of the adrenal glands. This medicine may be used for other purposes; ask your health care provider or pharmacist if you have questions. COMMON BRAND NAME(S): Deltasone, Predone, Sterapred DS, Sterapred What  should I tell my health care provider before I take this medicine? They need to know if you have any of these conditions: -Cushing's syndrome -diabetes -glaucoma -heart disease -high blood pressure -infection (especially a virus infection such as chickenpox, cold sores, or herpes) -kidney disease -liver disease -mental illness -myasthenia gravis -osteoporosis -seizures -stomach or intestine problems -thyroid disease -an unusual or allergic reaction to lactose, prednisone, other medicines, foods, dyes, or preservatives -pregnant or trying to get pregnant -breast-feeding How should I use this medicine? Take this medicine by mouth with a glass of water. Follow the directions on the prescription label. Take this medicine with food. If you are taking this medicine once a day, take it in the morning. Do not take more medicine than you are told to take. Do not suddenly stop taking your medicine because you may develop a severe reaction. Your doctor will tell you how much medicine to take. If your doctor wants you to stop the medicine, the dose may be slowly lowered over time to avoid any side effects. Talk to your pediatrician regarding the use of this medicine in children. Special care may be needed. Overdosage: If you think you have taken too much of this medicine contact a poison control center or emergency room at once. NOTE: This medicine is only for you. Do not share this medicine with others. What if I miss a dose? If you miss a dose, take it as soon as you can. If it is almost time for your next dose, talk to your doctor or health care professional. You may need to miss a dose or take an extra dose. Do not take double or extra doses without advice. What may interact with this medicine? Do not take this medicine with any of the following medications: -metyrapone -mifepristone This medicine may also interact with the following medications: -aminoglutethimide -amphotericin B -aspirin  and aspirin-like medicines -barbiturates -certain medicines for diabetes, like glipizide or glyburide -cholestyramine -cholinesterase inhibitors -cyclosporine -digoxin -diuretics -ephedrine -female hormones, like estrogens and birth control pills -isoniazid -ketoconazole -NSAIDS, medicines for pain and inflammation, like ibuprofen or naproxen -phenytoin -rifampin -toxoids -vaccines -warfarin This list may not describe all possible interactions. Give your health care provider a list of all the medicines, herbs, non-prescription drugs, or dietary supplements you use. Also tell them if you smoke, drink alcohol, or use illegal drugs. Some items may interact with your medicine. What should I watch for while using this medicine? Visit your doctor or health care professional for regular checks on your progress. If you are taking this medicine over a prolonged period, carry an identification card with your name and address, the type and dose of your medicine, and your doctor's name and address. This medicine may increase your risk of getting an infection. Tell your doctor or health care professional if you are around anyone with measles or chickenpox, or if you develop sores or blisters that do not heal properly. If you are going to have surgery, tell your doctor or health care professional that you have taken this medicine within the last twelve months. Ask your doctor or health care professional about your diet. You may need to lower the amount of salt you eat. This medicine may affect blood sugar levels. If you have diabetes, check with your doctor  or health care professional before you change your diet or the dose of your diabetic medicine. What side effects may I notice from receiving this medicine? Side effects that you should report to your doctor or health care professional as soon as possible: -allergic reactions like skin rash, itching or hives, swelling of the face, lips, or  tongue -changes in emotions or moods -changes in vision -depressed mood -eye pain -fever or chills, cough, sore throat, pain or difficulty passing urine -increased thirst -swelling of ankles, feet Side effects that usually do not require medical attention (report to your doctor or health care professional if they continue or are bothersome): -confusion, excitement, restlessness -headache -nausea, vomiting -skin problems, acne, thin and shiny skin -trouble sleeping -weight gain This list may not describe all possible side effects. Call your doctor for medical advice about side effects. You may report side effects to FDA at 1-800-FDA-1088. Where should I keep my medicine? Keep out of the reach of children. Store at room temperature between 15 and 30 degrees C (59 and 86 degrees F). Protect from light. Keep container tightly closed. Throw away any unused medicine after the expiration date. NOTE: This sheet is a summary. It may not cover all possible information. If you have questions about this medicine, talk to your doctor, pharmacist, or health care provider.  2014, Elsevier/Gold Standard. (2011-06-27 10:57:14)

## 2014-03-10 NOTE — Telephone Encounter (Signed)
gv pt appt schedule for april/may °

## 2014-03-11 LAB — DIRECT ANTIGLOBULIN TEST (NOT AT ARMC)
DAT (COMPLEMENT): NEGATIVE
DAT IGG: NEGATIVE

## 2014-03-11 LAB — HAPTOGLOBIN: Haptoglobin: 27 mg/dL — ABNORMAL LOW (ref 45–215)

## 2014-03-18 ENCOUNTER — Telehealth: Payer: Self-pay

## 2014-03-18 NOTE — Telephone Encounter (Signed)
Pt called on 4/22 asking for lab results from 4/16. Labs discussed over phone and placed on Dr [employer]. Pt is wanting to know if Dr Juliann Mule plans to start her on steroids. Pt called again 4/24. lvm that Dr Juliann Mule is out of the office on an emergency and we will call her on Monday.

## 2014-03-22 ENCOUNTER — Other Ambulatory Visit: Payer: Self-pay | Admitting: Internal Medicine

## 2014-03-22 ENCOUNTER — Telehealth: Payer: Self-pay | Admitting: Internal Medicine

## 2014-03-22 DIAGNOSIS — D649 Anemia, unspecified: Secondary | ICD-10-CM

## 2014-03-22 MED ORDER — PREDNISONE 5 MG PO TABS: 5.0000 mg | ORAL_TABLET | Freq: Two times a day (BID) | ORAL | Status: DC

## 2014-03-22 NOTE — Telephone Encounter (Signed)
Called pt regarding appt for 4/30 and May 14th lab and MD r/s due to PAL , left message,

## 2014-03-22 NOTE — Telephone Encounter (Signed)
Instructed patient to start prednisone 5 mg bid cautiously for hemolytic anemia.  We will trend her Hemoglobin and follow her clinically.  She is agreeable with this plan.

## 2014-03-24 ENCOUNTER — Other Ambulatory Visit (HOSPITAL_BASED_OUTPATIENT_CLINIC_OR_DEPARTMENT_OTHER): Payer: Medicare Other

## 2014-03-24 DIAGNOSIS — D649 Anemia, unspecified: Secondary | ICD-10-CM

## 2014-03-24 LAB — CBC WITH DIFFERENTIAL/PLATELET
BASO%: 0.4 % (ref 0.0–2.0)
Basophils Absolute: 0 10*3/uL (ref 0.0–0.1)
EOS%: 2.5 % (ref 0.0–7.0)
Eosinophils Absolute: 0.2 10*3/uL (ref 0.0–0.5)
HCT: 28.5 % — ABNORMAL LOW (ref 34.8–46.6)
HGB: 9.8 g/dL — ABNORMAL LOW (ref 11.6–15.9)
LYMPH%: 13.8 % — ABNORMAL LOW (ref 14.0–49.7)
MCH: 31.3 pg (ref 25.1–34.0)
MCHC: 34.2 g/dL (ref 31.5–36.0)
MCV: 91.5 fL (ref 79.5–101.0)
MONO#: 0.6 10*3/uL (ref 0.1–0.9)
MONO%: 8.3 % (ref 0.0–14.0)
NEUT#: 5.7 10*3/uL (ref 1.5–6.5)
NEUT%: 75 % (ref 38.4–76.8)
Platelets: 237 10*3/uL (ref 145–400)
RBC: 3.12 10*6/uL — AB (ref 3.70–5.45)
RDW: 18.4 % — AB (ref 11.2–14.5)
WBC: 7.5 10*3/uL (ref 3.9–10.3)
lymph#: 1 10*3/uL (ref 0.9–3.3)

## 2014-03-24 LAB — HAPTOGLOBIN: HAPTOGLOBIN: 79 mg/dL (ref 45–215)

## 2014-03-24 LAB — TECHNOLOGIST REVIEW

## 2014-03-24 LAB — LACTATE DEHYDROGENASE (CC13): LDH: 223 U/L (ref 125–245)

## 2014-04-07 ENCOUNTER — Other Ambulatory Visit: Payer: Medicare Other

## 2014-04-07 ENCOUNTER — Ambulatory Visit: Payer: Medicare Other

## 2014-04-08 ENCOUNTER — Other Ambulatory Visit (HOSPITAL_COMMUNITY): Payer: Self-pay | Admitting: *Deleted

## 2014-04-08 DIAGNOSIS — I6529 Occlusion and stenosis of unspecified carotid artery: Secondary | ICD-10-CM

## 2014-04-13 ENCOUNTER — Ambulatory Visit (HOSPITAL_BASED_OUTPATIENT_CLINIC_OR_DEPARTMENT_OTHER): Payer: Medicare Other | Admitting: Internal Medicine

## 2014-04-13 ENCOUNTER — Encounter: Payer: Self-pay | Admitting: Internal Medicine

## 2014-04-13 ENCOUNTER — Telehealth: Payer: Self-pay | Admitting: Internal Medicine

## 2014-04-13 ENCOUNTER — Other Ambulatory Visit (HOSPITAL_BASED_OUTPATIENT_CLINIC_OR_DEPARTMENT_OTHER): Payer: Medicare Other

## 2014-04-13 VITALS — BP 153/52 | HR 61 | Temp 98.3°F | Resp 18 | Ht 63.0 in | Wt 173.0 lb

## 2014-04-13 DIAGNOSIS — D649 Anemia, unspecified: Secondary | ICD-10-CM

## 2014-04-13 DIAGNOSIS — R17 Unspecified jaundice: Secondary | ICD-10-CM

## 2014-04-13 LAB — CBC WITH DIFFERENTIAL/PLATELET
BASO%: 0.6 % (ref 0.0–2.0)
Basophils Absolute: 0.1 10*3/uL (ref 0.0–0.1)
EOS ABS: 0.1 10*3/uL (ref 0.0–0.5)
EOS%: 1.5 % (ref 0.0–7.0)
HCT: 30.3 % — ABNORMAL LOW (ref 34.8–46.6)
HGB: 10.4 g/dL — ABNORMAL LOW (ref 11.6–15.9)
LYMPH%: 10.3 % — ABNORMAL LOW (ref 14.0–49.7)
MCH: 31.4 pg (ref 25.1–34.0)
MCHC: 34.3 g/dL (ref 31.5–36.0)
MCV: 91.4 fL (ref 79.5–101.0)
MONO#: 0.8 10*3/uL (ref 0.1–0.9)
MONO%: 8.3 % (ref 0.0–14.0)
NEUT#: 7.7 10*3/uL — ABNORMAL HIGH (ref 1.5–6.5)
NEUT%: 79.3 % — ABNORMAL HIGH (ref 38.4–76.8)
Platelets: 215 10*3/uL (ref 145–400)
RBC: 3.31 10*6/uL — AB (ref 3.70–5.45)
RDW: 18.2 % — ABNORMAL HIGH (ref 11.2–14.5)
WBC: 9.7 10*3/uL (ref 3.9–10.3)
lymph#: 1 10*3/uL (ref 0.9–3.3)

## 2014-04-13 MED ORDER — PREDNISONE 5 MG PO TABS: 5.0000 mg | ORAL_TABLET | Freq: Two times a day (BID) | ORAL | Status: DC

## 2014-04-13 NOTE — Patient Instructions (Signed)
Prednisone tablets What is this medicine? PREDNISONE (PRED ni sone) is a corticosteroid. It is commonly used to treat inflammation of the skin, joints, lungs, and other organs. Common conditions treated include asthma, allergies, and arthritis. It is also used for other conditions, such as blood disorders and diseases of the adrenal glands. This medicine may be used for other purposes; ask your health care provider or pharmacist if you have questions. COMMON BRAND NAME(S): Deltasone, Predone, Sterapred DS, Sterapred What should I tell my health care provider before I take this medicine? They need to know if you have any of these conditions: -Cushing's syndrome -diabetes -glaucoma -heart disease -high blood pressure -infection (especially a virus infection such as chickenpox, cold sores, or herpes) -kidney disease -liver disease -mental illness -myasthenia gravis -osteoporosis -seizures -stomach or intestine problems -thyroid disease -an unusual or allergic reaction to lactose, prednisone, other medicines, foods, dyes, or preservatives -pregnant or trying to get pregnant -breast-feeding How should I use this medicine? Take this medicine by mouth with a glass of water. Follow the directions on the prescription label. Take this medicine with food. If you are taking this medicine once a day, take it in the morning. Do not take more medicine than you are told to take. Do not suddenly stop taking your medicine because you may develop a severe reaction. Your doctor will tell you how much medicine to take. If your doctor wants you to stop the medicine, the dose may be slowly lowered over time to avoid any side effects. Talk to your pediatrician regarding the use of this medicine in children. Special care may be needed. Overdosage: If you think you have taken too much of this medicine contact a poison control center or emergency room at once. NOTE: This medicine is only for you. Do not share this  medicine with others. What if I miss a dose? If you miss a dose, take it as soon as you can. If it is almost time for your next dose, talk to your doctor or health care professional. You may need to miss a dose or take an extra dose. Do not take double or extra doses without advice. What may interact with this medicine? Do not take this medicine with any of the following medications: -metyrapone -mifepristone This medicine may also interact with the following medications: -aminoglutethimide -amphotericin B -aspirin and aspirin-like medicines -barbiturates -certain medicines for diabetes, like glipizide or glyburide -cholestyramine -cholinesterase inhibitors -cyclosporine -digoxin -diuretics -ephedrine -female hormones, like estrogens and birth control pills -isoniazid -ketoconazole -NSAIDS, medicines for pain and inflammation, like ibuprofen or naproxen -phenytoin -rifampin -toxoids -vaccines -warfarin This list may not describe all possible interactions. Give your health care provider a list of all the medicines, herbs, non-prescription drugs, or dietary supplements you use. Also tell them if you smoke, drink alcohol, or use illegal drugs. Some items may interact with your medicine. What should I watch for while using this medicine? Visit your doctor or health care professional for regular checks on your progress. If you are taking this medicine over a prolonged period, carry an identification card with your name and address, the type and dose of your medicine, and your doctor's name and address. This medicine may increase your risk of getting an infection. Tell your doctor or health care professional if you are around anyone with measles or chickenpox, or if you develop sores or blisters that do not heal properly. If you are going to have surgery, tell your doctor or health care professional that   you have taken this medicine within the last twelve months. Ask your doctor or health  care professional about your diet. You may need to lower the amount of salt you eat. This medicine may affect blood sugar levels. If you have diabetes, check with your doctor or health care professional before you change your diet or the dose of your diabetic medicine. What side effects may I notice from receiving this medicine? Side effects that you should report to your doctor or health care professional as soon as possible: -allergic reactions like skin rash, itching or hives, swelling of the face, lips, or tongue -changes in emotions or moods -changes in vision -depressed mood -eye pain -fever or chills, cough, sore throat, pain or difficulty passing urine -increased thirst -swelling of ankles, feet Side effects that usually do not require medical attention (report to your doctor or health care professional if they continue or are bothersome): -confusion, excitement, restlessness -headache -nausea, vomiting -skin problems, acne, thin and shiny skin -trouble sleeping -weight gain This list may not describe all possible side effects. Call your doctor for medical advice about side effects. You may report side effects to FDA at 1-800-FDA-1088. Where should I keep my medicine? Keep out of the reach of children. Store at room temperature between 15 and 30 degrees C (59 and 86 degrees F). Protect from light. Keep container tightly closed. Throw away any unused medicine after the expiration date. NOTE: This sheet is a summary. It may not cover all possible information. If you have questions about this medicine, talk to your doctor, pharmacist, or health care provider.  2014, Elsevier/Gold Standard. (2011-06-27 10:57:14)  

## 2014-04-13 NOTE — Progress Notes (Signed)
[place] OFFICE PROGRESS NOTE  Claudia Calico, MD [facility] [address] [place] [address]  DIAGNOSIS: ANEMIA - Plan: CBC with Differential, Lactate dehydrogenase (LDH) - [facility], Haptoglobin, CBC with Differential  Chief Complaint  Patient presents with  . Anemia    CURRENT TREATMENT:  Prednisone 5 mg bid started on 03/16/2014  INTERVAL HISTORY: Claudia Lara 80 y.o. female with a history of hypertension who presented on  08/05/2013 for further evaluation of anemia by her PCP Dr. Linda Hedges is here for follow-up.  She was seen by me on 03/10/2014.  Initially, she reports taking supplemental iron from time to time with her first low hemoglobin about 5 years ago. She reported recently having increased dyspnea on exertion. She used to walk about a mile per day but quit about 2 years ago due to increased DOE. She denied a history of blood transfusions outside of when she received packed red blood cells intraoperatively in November 2011 during her right knee replacement surgery. She denied a history of blood clots. She reported being up-to-date on her screening exams with her last colonoscopy at age 38. Her mammogram was done on 08/18/2013 and was negative.   Today, she is feeling fine.  She denies a recent history of bleeding problems or recurrent infections.  Her CBC on 07/07/13 revealed a hemoglobin of 9.9 compared to 10.4 two years ago. Her creatinine was 1.2 on 07/07/13.   She was recently seen for lower extremity weakness.  Her ABI was normal in 2011.   She denies any hospitalizations or emergency room visits.  She started prednisone 5 mg bid on 03/16/2014  MEDICAL HISTORY: Past Medical History  Diagnosis Date  . ANEMIA 08/22/2009  . Unspecified essential hypertension 10/19/2007  . CAROTID ARTERY STENOSIS 01/16/2010  . PVD 10/06/2007  . HEMORRHOIDS, INTERNAL     surgery was in the 90's (per patient)  . GEN OSTEOARTHROSIS INVOLVING MULTIPLE SITES 11/11/2008  . NECK  PAIN, ACUTE 12/28/2009  . PERIPHERAL EDEMA 10/06/2007  . DYSPNEA ON EXERTION 11/16/2007    INTERIM HISTORY: has ANEMIA; Unspecified essential hypertension; CAROTID ARTERY STENOSIS; PVD; HEMORRHOIDS, INTERNAL; GEN OSTEOARTHROSIS INVOLVING MULTIPLE SITES; NECK PAIN, ACUTE; PERIPHERAL EDEMA; DYSPNEA ON EXERTION; Routine health maintenance; Need for prophylactic vaccination with tetanus-diphtheria (TD); and Chest pain with high risk for cardiac etiology on her problem list.    ALLERGIES:  is allergic to sulfonamide derivatives.  MEDICATIONS: has a current medication list which includes the following prescription(s): vitamin c, calcium 600 + minerals, carvedilol, glucosamine 1500 complex, meloxicam, omeprazole, prednisone, and furosemide.  SURGICAL HISTORY:  Past Surgical History  Procedure Laterality Date  . Hyperplastic colon polyps, removed  2007    By Dr. Penelope Coop  . Oophorectomy    . Rotator cuff repair  2004    left (Dr. Durward Fortes)  . Abdominal hysterectomy    . Joint replacement  2011    right knee    REVIEW OF SYSTEMS:   Constitutional: Denies fevers, chills or abnormal weight loss Eyes: Denies blurriness of vision Ears, nose, mouth, throat, and face: Denies mucositis or sore throat Respiratory: Denies cough, dyspnea or wheezes Cardiovascular: Denies palpitation, chest discomfort or lower extremity swelling Gastrointestinal:  Denies nausea, heartburn or change in bowel habits Skin: Denies abnormal skin rashes Lymphatics: Denies new lymphadenopathy or easy bruising Neurological:Denies numbness, tingling or new weaknesses Behavioral/Psych: Mood is stable, no new changes  All other systems were reviewed with the patient and are negative.  PHYSICAL EXAMINATION: ECOG PERFORMANCE  STATUS: 1 - Symptomatic but completely ambulatory  Blood pressure 153/52, pulse 61, temperature 98.3 F (36.8 C), temperature source Oral, resp. rate 18, height 5\' 3"  (1.6 m), weight 173 lb (78.472  kg).  GENERAL:alert, no distress and comfortable SKIN: skin color, texture, turgor are normal, no rashes or significant lesions EYES: normal, Conjunctiva are pink and non-injected, sclera clear OROPHARYNX:no exudate, no erythema and lips, buccal mucosa, and tongue normal  NECK: supple, thyroid normal size, non-tender, without nodularity LYMPH:  no palpable lymphadenopathy in the cervical, axillary or supraclavicular LUNGS: clear to auscultation with normal breathing effort, no wheezes or rhonchi HEART: regular rate & rhythm and no murmurs and no lower extremity edema ABDOMEN:abdomen soft, non-tender and normal bowel sounds Musculoskeletal:no cyanosis of digits and no clubbing ; severe athritic changes in the hands.  NEURO: alert & oriented x 3 with fluent speech, no focal motor/sensory deficits  Labs:  Lab Results  Component Value Date   WBC 9.7 04/13/2014   HGB 10.4* 04/13/2014   HCT 30.3* 04/13/2014   MCV 91.4 04/13/2014   PLT 215 04/13/2014   NEUTROABS 7.7* 04/13/2014      Chemistry      Component Value Date/Time   NA 143 02/14/2014 1057   NA 140 07/07/2013 1212   K 4.9 02/14/2014 1057   K 5.2* 07/07/2013 1212   CL 109 07/07/2013 1212   CO2 25 02/14/2014 1057   CO2 27 07/07/2013 1212   BUN 29.8* 02/14/2014 1057   BUN 30* 07/07/2013 1212   CREATININE 1.1 02/14/2014 1057   CREATININE 1.2 07/07/2013 1212      Component Value Date/Time   CALCIUM 9.2 02/14/2014 1057   CALCIUM 9.3 07/07/2013 1212   ALKPHOS 80 02/14/2014 1057   ALKPHOS 75 07/07/2013 1212   AST 18 02/14/2014 1057   AST 22 07/07/2013 1212   ALT 15 02/14/2014 1057   ALT 21 07/07/2013 1212   BILITOT 1.54* 02/14/2014 1057   BILITOT 1.9* 07/07/2013 1212      CBC:  Recent Labs Lab 04/13/14 0839  WBC 9.7  NEUTROABS 7.7*  HGB 10.4*  HCT 30.3*  MCV 91.4  PLT 215   Results for Claudia Lara, Claudia Lara (MRN [id number]) as of 04/13/2014 11:00  Ref. Range 02/14/2014 10:57 03/10/2014 15:32 03/24/2014 09:53  Haptoglobin Latest Range: 45-215  mg/dL 31 (L) 27 (L) 79    Studies:  No results found.   RADIOGRAPHIC STUDIES: No results found.  ASSESSMENT: Claudia Lara 80 y.o. female with a history of ANEMIA - Plan: CBC with Differential, Lactate dehydrogenase (LDH) - [facility], Haptoglobin, CBC with Differential   PLAN:  1. Normocytic anemia NOS - Her hemogloblin is 10.4 today (higher than it has been in nearly 2 years).  Her iron studies were negative for iron deficiency. Her prior retic count % is 7% less than 9% more typical of hemolysis.  Her LDH was normal which points away from hemolysis.  Her repeat hemolysis studies including haptoglobin is 31 (low) and elevated retic count points toward hemolysis.  Her haptoglobin normalized on 03/24/2014.   We started prednisone 5 mg bid for hemolysis given decreased haptoglobin and elevated retic.  Her hemoglobin has improved slightly and her haptoglobin has normalized.  --  Sincer her ferritin is greater than 100 and her Fe/TIBC is greater than 20%, she is less likely to benefit from iron supplementation and was instructed to stop.  --Other considerations also include a primary bone marrow problem given her age.  With both her  platlets and WBC being normal points against a primary bone marrow disorder. With normal LFTs and no reported history of hepatic injury, primary liver disease is also less likely.  Her creatinine clearance is greater than 30 mL/min which typically suggestive of anemia of chronic kidney disease.  We checked an erythropoietin level and it is less than 500 mU/mL, she might benefit from epo shots.  --She is scheduled for follow up colonoscopy this year with Dr. Ardis Hughs.  Prior EGD unrevealing with the exception of a hiatal hernia per patient.   2. Hyperbilirubinemia. Likely secondary to #1. Should improve with prednisone as well.   3. Followup. RTC in 2 months with repeat laboratories and in 4 weeks for repeat labs.    All questions were answered. The patient knows to call the  clinic with any problems, questions or concerns. We can certainly see the patient much sooner if necessary.  I spent 15 minutes counseling the patient face to face. The total time spent in the appointment was 25 minutes.    Concha Norway, MD 04/13/2014 10:58 AM

## 2014-04-13 NOTE — Telephone Encounter (Signed)
gv and printed aptp sched and avs for tpf ro June and July

## 2014-04-14 LAB — DIRECT ANTIGLOBULIN TEST (NOT AT ARMC)
DAT (Complement): NEGATIVE
DAT IGG: NEGATIVE

## 2014-04-14 LAB — HAPTOGLOBIN: HAPTOGLOBIN: 58 mg/dL (ref 45–215)

## 2014-04-21 ENCOUNTER — Ambulatory Visit (INDEPENDENT_AMBULATORY_CARE_PROVIDER_SITE_OTHER): Payer: Medicare Other | Admitting: Internal Medicine

## 2014-04-21 ENCOUNTER — Encounter: Payer: Self-pay | Admitting: Internal Medicine

## 2014-04-21 ENCOUNTER — Other Ambulatory Visit (INDEPENDENT_AMBULATORY_CARE_PROVIDER_SITE_OTHER): Payer: Medicare Other

## 2014-04-21 VITALS — BP 124/72 | HR 72 | Temp 97.8°F | Resp 16 | Ht 63.0 in | Wt 171.0 lb

## 2014-04-21 DIAGNOSIS — Z0000 Encounter for general adult medical examination without abnormal findings: Secondary | ICD-10-CM

## 2014-04-21 DIAGNOSIS — N1832 Chronic kidney disease, stage 3b: Secondary | ICD-10-CM | POA: Insufficient documentation

## 2014-04-21 DIAGNOSIS — M8580 Other specified disorders of bone density and structure, unspecified site: Secondary | ICD-10-CM

## 2014-04-21 DIAGNOSIS — N183 Chronic kidney disease, stage 3 unspecified: Secondary | ICD-10-CM | POA: Insufficient documentation

## 2014-04-21 DIAGNOSIS — I10 Essential (primary) hypertension: Secondary | ICD-10-CM

## 2014-04-21 DIAGNOSIS — M949 Disorder of cartilage, unspecified: Secondary | ICD-10-CM

## 2014-04-21 DIAGNOSIS — E785 Hyperlipidemia, unspecified: Secondary | ICD-10-CM

## 2014-04-21 DIAGNOSIS — M899 Disorder of bone, unspecified: Secondary | ICD-10-CM

## 2014-04-21 DIAGNOSIS — M6530 Trigger finger, unspecified finger: Secondary | ICD-10-CM

## 2014-04-21 DIAGNOSIS — M159 Polyosteoarthritis, unspecified: Secondary | ICD-10-CM

## 2014-04-21 DIAGNOSIS — D649 Anemia, unspecified: Secondary | ICD-10-CM

## 2014-04-21 LAB — LIPID PANEL
CHOLESTEROL: 136 mg/dL (ref 0–200)
HDL: 62.9 mg/dL (ref >39.00)
LDL Cholesterol: 55 mg/dL (ref 0–99)
Total CHOL/HDL Ratio: 2
Triglycerides: 93 mg/dL (ref 0.0–149.0)
VLDL: 18.6 mg/dL (ref 0.0–40.0)

## 2014-04-21 LAB — COMPREHENSIVE METABOLIC PANEL
ALBUMIN: 3.9 g/dL (ref 3.5–5.2)
ALT: 26 U/L (ref 0–35)
AST: 21 U/L (ref 0–37)
Alkaline Phosphatase: 57 U/L (ref 39–117)
BUN: 31 mg/dL — AB (ref 6–23)
CO2: 26 meq/L (ref 19–32)
Calcium: 9 mg/dL (ref 8.4–10.5)
Chloride: 108 mEq/L (ref 96–112)
Creatinine, Ser: 1.4 mg/dL — ABNORMAL HIGH (ref 0.4–1.2)
GFR: 40.1 mL/min — ABNORMAL LOW (ref >60.00)
Glucose, Bld: 101 mg/dL — ABNORMAL HIGH (ref 70–99)
POTASSIUM: 4.9 meq/L (ref 3.5–5.1)
SODIUM: 141 meq/L (ref 135–145)
TOTAL PROTEIN: 6 g/dL (ref 6.0–8.3)
Total Bilirubin: 1.5 mg/dL — ABNORMAL HIGH (ref 0.2–1.2)

## 2014-04-21 LAB — TSH: TSH: 0.76 u[IU]/mL (ref 0.35–4.50)

## 2014-04-21 NOTE — Assessment & Plan Note (Signed)
FLP today 

## 2014-04-21 NOTE — Assessment & Plan Note (Signed)
Will stop the lasix She will avoid nephrotoxic agents - will d'c the nsaids

## 2014-04-21 NOTE — Assessment & Plan Note (Signed)
Ortho referral  

## 2014-04-21 NOTE — Progress Notes (Signed)
Pre visit review using our clinic review tool, if applicable. No additional management support is needed unless otherwise documented below in the visit note. 

## 2014-04-21 NOTE — Assessment & Plan Note (Addendum)

## 2014-04-21 NOTE — Assessment & Plan Note (Signed)
Her BP is adequately well controlled 

## 2014-04-21 NOTE — Assessment & Plan Note (Signed)
I will check her VIT D level She is due for a DEXA scan

## 2014-04-21 NOTE — Patient Instructions (Addendum)
Preventive Care for Adults, Female A healthy lifestyle and preventive care can promote health and wellness. Preventive health guidelines for women include the following key practices.  A routine yearly physical is a good way to check with your health care provider about your health and preventive screening. It is a chance to share any concerns and updates on your health and to receive a thorough exam.  Visit your dentist for a routine exam and preventive care every 6 months. Brush your teeth twice a day and floss once a day. Good oral hygiene prevents tooth decay and gum disease.  The frequency of eye exams is based on your age, health, family medical history, use of contact lenses, and other factors. Follow your health care provider's recommendations for frequency of eye exams.  Eat a healthy diet. Foods like vegetables, fruits, whole grains, low-fat dairy products, and lean protein foods contain the nutrients you need without too many calories. Decrease your intake of foods high in solid fats, added sugars, and salt. Eat the right amount of calories for you.Get information about a proper diet from your health care provider, if necessary.  Regular physical exercise is one of the most important things you can do for your health. Most adults should get at least 150 minutes of moderate-intensity exercise (any activity that increases your heart rate and causes you to sweat) each week. In addition, most adults need muscle-strengthening exercises on 2 or more days a week.  Maintain a healthy weight. The body mass index (BMI) is a screening tool to identify possible weight problems. It provides an estimate of body fat based on height and weight. Your health care provider can find your BMI, and can help you achieve or maintain a healthy weight.For adults 20 years and older:  A BMI below 18.5 is considered underweight.  A BMI of 18.5 to 24.9 is normal.  A BMI of 25 to 29.9 is considered overweight.  A  BMI of 30 and above is considered obese.  Maintain normal blood lipids and cholesterol levels by exercising and minimizing your intake of saturated fat. Eat a balanced diet with plenty of fruit and vegetables. Blood tests for lipids and cholesterol should begin at age 62 and be repeated every 5 years. If your lipid or cholesterol levels are high, you are over 50, or you are at high risk for heart disease, you may need your cholesterol levels checked more frequently.Ongoing high lipid and cholesterol levels should be treated with medicines if diet and exercise are not working.  If you smoke, find out from your health care provider how to quit. If you do not use tobacco, do not start.  Lung cancer screening is recommended for adults aged 36 80 years who are at high risk for developing lung cancer because of a history of smoking. A yearly low-dose CT scan of the lungs is recommended for people who have at least a 30-pack-year history of smoking and are a current smoker or have quit within the past 15 years. A pack year of smoking is smoking an average of 1 pack of cigarettes a day for 1 year (for example: 1 pack a day for 30 years or 2 packs a day for 15 years). Yearly screening should continue until the smoker has stopped smoking for at least 15 years. Yearly screening should be stopped for people who develop a health problem that would prevent them from having lung cancer treatment.  If you are pregnant, do not drink alcohol. If you  are breastfeeding, be very cautious about drinking alcohol. If you are not pregnant and choose to drink alcohol, do not have more than 1 drink per day. One drink is considered to be 12 ounces (355 mL) of beer, 5 ounces (148 mL) of wine, or 1.5 ounces (44 mL) of liquor.  Avoid use of street drugs. Do not share needles with anyone. Ask for help if you need support or instructions about stopping the use of drugs.  High blood pressure causes heart disease and increases the risk  of stroke. Your blood pressure should be checked at least every 1 to 2 years. Ongoing high blood pressure should be treated with medicines if weight loss and exercise do not work.  If you are 39 78 years old, ask your health care provider if you should take aspirin to prevent strokes.  Diabetes screening involves taking a blood sample to check your fasting blood sugar level. This should be done once every 3 years, after age 56, if you are within normal weight and without risk factors for diabetes. Testing should be considered at a younger age or be carried out more frequently if you are overweight and have at least 1 risk factor for diabetes.  Breast cancer screening is essential preventive care for women. You should practice "breast self-awareness." This means understanding the normal appearance and feel of your breasts and may include breast self-examination. Any changes detected, no matter how small, should be reported to a health care provider. Women in their 40s and 30s should have a clinical breast exam (CBE) by a health care provider as part of a regular health exam every 1 to 3 years. After age 28, women should have a CBE every year. Starting at age 72, women should consider having a mammogram (breast X-ray test) every year. Women who have a family history of breast cancer should talk to their health care provider about genetic screening. Women at a high risk of breast cancer should talk to their health care providers about having an MRI and a mammogram every year.  Breast cancer gene (BRCA)-related cancer risk assessment is recommended for women who have family members with BRCA-related cancers. BRCA-related cancers include breast, ovarian, tubal, and peritoneal cancers. Having family members with these cancers may be associated with an increased risk for harmful changes (mutations) in the breast cancer genes BRCA1 and BRCA2. Results of the assessment will determine the need for genetic counseling  and BRCA1 and BRCA2 testing.  The Pap test is a screening test for cervical cancer. A Pap test can show cell changes on the cervix that might become cervical cancer if left untreated. A Pap test is a procedure in which cells are obtained and examined from the lower end of the uterus (cervix).  Women should have a Pap test starting at age 59.  Between ages 42 and 13, Pap tests should be repeated every 2 years.  Beginning at age 53, you should have a Pap test every 3 years as long as the past 3 Pap tests have been normal.  Some women have medical problems that increase the chance of getting cervical cancer. Talk to your health care provider about these problems. It is especially important to talk to your health care provider if a new problem develops soon after your last Pap test. In these cases, your health care provider may recommend more frequent screening and Pap tests.  The above recommendations are the same for women who have or have not gotten the vaccine  for human papillomavirus (HPV).  If you had a hysterectomy for a problem that was not cancer or a condition that could lead to cancer, then you no longer need Pap tests. Even if you no longer need a Pap test, a regular exam is a good idea to make sure no other problems are starting.  If you are between ages 58 and 10 years, and you have had normal Pap tests going back 10 years, you no longer need Pap tests. Even if you no longer need a Pap test, a regular exam is a good idea to make sure no other problems are starting.  If you have had past treatment for cervical cancer or a condition that could lead to cancer, you need Pap tests and screening for cancer for at least 20 years after your treatment.  If Pap tests have been discontinued, risk factors (such as a new sexual partner) need to be reassessed to determine if screening should be resumed.  The HPV test is an additional test that may be used for cervical cancer screening. The HPV test  looks for the virus that can cause the cell changes on the cervix. The cells collected during the Pap test can be tested for HPV. The HPV test could be used to screen women aged 67 years and older, and should be used in women of any age who have unclear Pap test results. After the age of 65, women should have HPV testing at the same frequency as a Pap test.  Colorectal cancer can be detected and often prevented. Most routine colorectal cancer screening begins at the age of 25 years and continues through age 66 years. However, your health care provider may recommend screening at an earlier age if you have risk factors for colon cancer. On a yearly basis, your health care provider may provide home test kits to check for hidden blood in the stool. Use of a small camera at the end of a tube, to directly examine the colon (sigmoidoscopy or colonoscopy), can detect the earliest forms of colorectal cancer. Talk to your health care provider about this at age 79, when routine screening begins. Direct exam of the colon should be repeated every 5 10 years through age 47 years, unless early forms of pre-cancerous polyps or small growths are found.  People who are at an increased risk for hepatitis B should be screened for this virus. You are considered at high risk for hepatitis B if:  You were born in a country where hepatitis B occurs often. Talk with your health care provider about which countries are considered high risk.  Your parents were born in a high-risk country and you have not received a shot to protect against hepatitis B (hepatitis B vaccine).  You have HIV or AIDS.  You use needles to inject street drugs.  You live with, or have sex with, someone who has Hepatitis B.  You get hemodialysis treatment.  You take certain medicines for conditions like cancer, organ transplantation, and autoimmune conditions.  Hepatitis C blood testing is recommended for all people born from [birth year] through 1965 and  any individual with known risks for hepatitis C.  Practice safe sex. Use condoms and avoid high-risk sexual practices to reduce the spread of sexually transmitted infections (STIs). STIs include gonorrhea, chlamydia, syphilis, trichomonas, herpes, HPV, and human immunodeficiency virus (HIV). Herpes, HIV, and HPV are viral illnesses that have no cure. They can result in disability, cancer, and death. Sexually active women aged 66  years and younger should be checked for chlamydia. Older women with new or multiple partners should also be tested for chlamydia. Testing for other STIs is recommended if you are sexually active and at increased risk.  Osteoporosis is a disease in which the bones lose minerals and strength with aging. This can result in serious bone fractures or breaks. The risk of osteoporosis can be identified using a bone density scan. Women ages 18 years and over and women at risk for fractures or osteoporosis should discuss screening with their health care providers. Ask your health care provider whether you should take a calcium supplement or vitamin D to reduce the rate of osteoporosis.  Menopause can be associated with physical symptoms and risks. Hormone replacement therapy is available to decrease symptoms and risks. You should talk to your health care provider about whether hormone replacement therapy is right for you.  Use sunscreen. Apply sunscreen liberally and repeatedly throughout the day. You should seek shade when your shadow is shorter than you. Protect yourself by wearing long sleeves, pants, a wide-brimmed hat, and sunglasses year round, whenever you are outdoors.  Once a month, do a whole body skin exam, using a mirror to look at the skin on your back. Tell your health care provider of new moles, moles that have irregular borders, moles that are larger than a pencil eraser, or moles that have changed in shape or color.  Stay current with required vaccines  (immunizations).  Influenza vaccine. All adults should be immunized every year.  Tetanus, diphtheria, and acellular pertussis (Td, Tdap) vaccine. Pregnant women should receive 1 dose of Tdap vaccine during each pregnancy. The dose should be obtained regardless of the length of time since the last dose. Immunization is preferred during the 27th 36th week of gestation. An adult who has not previously received Tdap or who does not know her vaccine status should receive 1 dose of Tdap. This initial dose should be followed by tetanus and diphtheria toxoids (Td) booster doses every 10 years. Adults with an unknown or incomplete history of completing a 3-dose immunization series with Td-containing vaccines should begin or complete a primary immunization series including a Tdap dose. Adults should receive a Td booster every 10 years.  Varicella vaccine. An adult without evidence of immunity to varicella should receive 2 doses or a second dose if she has previously received 1 dose. Pregnant females who do not have evidence of immunity should receive the first dose after pregnancy. This first dose should be obtained before leaving the health care facility. The second dose should be obtained 4 8 weeks after the first dose.  Human papillomavirus (HPV) vaccine. Females aged 9 26 years who have not received the vaccine previously should obtain the 3-dose series. The vaccine is not recommended for use in pregnant females. However, pregnancy testing is not needed before receiving a dose. If a female is found to be pregnant after receiving a dose, no treatment is needed. In that case, the remaining doses should be delayed until after the pregnancy. Immunization is recommended for any person with an immunocompromised condition through the age of 51 years if she did not get any or all doses earlier. During the 3-dose series, the second dose should be obtained 4 8 weeks after the first dose. The third dose should be obtained  24 weeks after the first dose and 16 weeks after the second dose.  Zoster vaccine. One dose is recommended for adults aged 57 years or older unless certain  conditions are present.  Measles, mumps, and rubella (MMR) vaccine. Adults born before [birth year] generally are considered immune to measles and mumps. Adults born in [birth year] or later should have 1 or more doses of MMR vaccine unless there is a contraindication to the vaccine or there is laboratory evidence of immunity to each of the three diseases. A routine second dose of MMR vaccine should be obtained at least 28 days after the first dose for students attending postsecondary schools, health care workers, or international travelers. People who received inactivated measles vaccine or an unknown type of measles vaccine during 1963 1967 should receive 2 doses of MMR vaccine. People who received inactivated mumps vaccine or an unknown type of mumps vaccine before 1979 and are at high risk for mumps infection should consider immunization with 2 doses of MMR vaccine. For females of childbearing age, rubella immunity should be determined. If there is no evidence of immunity, females who are not pregnant should be vaccinated. If there is no evidence of immunity, females who are pregnant should delay immunization until after pregnancy. Unvaccinated health care workers born before [birth year] who lack laboratory evidence of measles, mumps, or rubella immunity or laboratory confirmation of disease should consider measles and mumps immunization with 2 doses of MMR vaccine or rubella immunization with 1 dose of MMR vaccine.  Pneumococcal 13-valent conjugate (PCV13) vaccine. When indicated, a person who is uncertain of her immunization history and has no record of immunization should receive the PCV13 vaccine. An adult aged 42 years or older who has certain medical conditions and has not been previously immunized should receive 1 dose of PCV13 vaccine. This PCV13 should be followed  with a dose of pneumococcal polysaccharide (PPSV23) vaccine. The PPSV23 vaccine dose should be obtained at least 8 weeks after the dose of PCV13 vaccine. An adult aged 4 years or older who has certain medical conditions and previously received 1 or more doses of PPSV23 vaccine should receive 1 dose of PCV13. The PCV13 vaccine dose should be obtained 1 or more years after the last PPSV23 vaccine dose.  Pneumococcal polysaccharide (PPSV23) vaccine. When PCV13 is also indicated, PCV13 should be obtained first. All adults aged 27 years and older should be immunized. An adult younger than age 33 years who has certain medical conditions should be immunized. Any person who resides in a nursing home or long-term care facility should be immunized. An adult smoker should be immunized. People with an immunocompromised condition and certain other conditions should receive both PCV13 and PPSV23 vaccines. People with human immunodeficiency virus (HIV) infection should be immunized as soon as possible after diagnosis. Immunization during chemotherapy or radiation therapy should be avoided. Routine use of PPSV23 vaccine is not recommended for American Indians, [place] Natives, or people younger than 65 years unless there are medical conditions that require PPSV23 vaccine. When indicated, people who have unknown immunization and have no record of immunization should receive PPSV23 vaccine. One-time revaccination 5 years after the first dose of PPSV23 is recommended for people aged 13 64 years who have chronic kidney failure, nephrotic syndrome, asplenia, or immunocompromised conditions. People who received 1 2 doses of PPSV23 before age 66 years should receive another dose of PPSV23 vaccine at age 27 years or later if at least 5 years have passed since the previous dose. Doses of PPSV23 are not needed for people immunized with PPSV23 at or after age 33 years.  Meningococcal vaccine. Adults with asplenia or persistent complement  component deficiencies should receive 2  doses of quadrivalent meningococcal conjugate (MenACWY-D) vaccine. The doses should be obtained at least 2 months apart. Microbiologists working with certain meningococcal bacteria, [place] recruits, people at risk during an outbreak, and people who travel to or live in countries with a high rate of meningitis should be immunized. A first-year college student up through age 49 years who is living in a residence hall should receive a dose if she did not receive a dose on or after her 16th birthday. Adults who have certain high-risk conditions should receive one or more doses of vaccine.  Hepatitis A vaccine. Adults who wish to be protected from this disease, have certain high-risk conditions, work with hepatitis A-infected animals, work in hepatitis A research labs, or travel to or work in countries with a high rate of hepatitis A should be immunized. Adults who were previously unvaccinated and who anticipate close contact with an international adoptee during the first 60 days after arrival in the [place] from a country with a high rate of hepatitis A should be immunized.  Hepatitis B vaccine. Adults who wish to be protected from this disease, have certain high-risk conditions, may be exposed to blood or other infectious body fluids, are household contacts or sex partners of hepatitis B positive people, are clients or workers in certain care facilities, or travel to or work in countries with a high rate of hepatitis B should be immunized.  Haemophilus influenzae type b (Hib) vaccine. A previously unvaccinated person with asplenia or sickle cell disease or having a scheduled splenectomy should receive 1 dose of Hib vaccine. Regardless of previous immunization, a recipient of a hematopoietic stem cell transplant should receive a 3-dose series 6 12 months after her successful transplant. Hib vaccine is not recommended for adults with HIV infection. Preventive  Services / Frequency Ages 24 to 39years  Blood pressure check.** / Every 1 to 2 years.  Lipid and cholesterol check.** / Every 5 years beginning at age 66.  Clinical breast exam.** / Every 3 years for women in their 12s and 24s.  BRCA-related cancer risk assessment.** / For women who have family members with a BRCA-related cancer (breast, ovarian, tubal, or peritoneal cancers).  Pap test.** / Every 2 years from ages 31 through 69. Every 3 years starting at age 64 through age 76 or 89 with a history of 3 consecutive normal Pap tests.  HPV screening.** / Every 3 years from ages 10 through ages 10 to 96 with a history of 3 consecutive normal Pap tests.  Hepatitis C blood test.** / For any individual with known risks for hepatitis C.  Skin self-exam. / Monthly.  Influenza vaccine. / Every year.  Tetanus, diphtheria, and acellular pertussis (Tdap, Td) vaccine.** / Consult your health care provider. Pregnant women should receive 1 dose of Tdap vaccine during each pregnancy. 1 dose of Td every 10 years.  Varicella vaccine.** / Consult your health care provider. Pregnant females who do not have evidence of immunity should receive the first dose after pregnancy.  HPV vaccine. / 3 doses over 6 months, if 90 and younger. The vaccine is not recommended for use in pregnant females. However, pregnancy testing is not needed before receiving a dose.  Measles, mumps, rubella (MMR) vaccine.** / You need at least 1 dose of MMR if you were born in 1957 or later. You may also need a 2nd dose. For females of childbearing age, rubella immunity should be determined. If there is no evidence of immunity, females who are not  pregnant should be vaccinated. If there is no evidence of immunity, females who are pregnant should delay immunization until after pregnancy.  Pneumococcal 13-valent conjugate (PCV13) vaccine.** / Consult your health care provider.  Pneumococcal polysaccharide (PPSV23) vaccine.** / 1 to 2  doses if you smoke cigarettes or if you have certain conditions.  Meningococcal vaccine.** / 1 dose if you are age 88 to 6 years and a Market researcher living in a residence hall, or have one of several medical conditions, you need to get vaccinated against meningococcal disease. You may also need additional booster doses.  Hepatitis A vaccine.** / Consult your health care provider.  Hepatitis B vaccine.** / Consult your health care provider.  Haemophilus influenzae type b (Hib) vaccine.** / Consult your health care provider. Ages 23 to 64years  Blood pressure check.** / Every 1 to 2 years.  Lipid and cholesterol check.** / Every 5 years beginning at age 20 years.  Lung cancer screening. / Every year if you are aged 51 80 years and have a 30-pack-year history of smoking and currently smoke or have quit within the past 15 years. Yearly screening is stopped once you have quit smoking for at least 15 years or develop a health problem that would prevent you from having lung cancer treatment.  Clinical breast exam.** / Every year after age 8 years.  BRCA-related cancer risk assessment.** / For women who have family members with a BRCA-related cancer (breast, ovarian, tubal, or peritoneal cancers).  Mammogram.** / Every year beginning at age 10 years and continuing for as long as you are in good health. Consult with your health care provider.  Pap test.** / Every 3 years starting at age 30 years through age 5 or 61 years with a history of 3 consecutive normal Pap tests.  HPV screening.** / Every 3 years from ages 39 years through ages 72 to 19 years with a history of 3 consecutive normal Pap tests.  Fecal occult blood test (FOBT) of stool. / Every year beginning at age 59 years and continuing until age 27 years. You may not need to do this test if you get a colonoscopy every 10 years.  Flexible sigmoidoscopy or colonoscopy.** / Every 5 years for a flexible sigmoidoscopy or every  10 years for a colonoscopy beginning at age 110 years and continuing until age 63 years.  Hepatitis C blood test.** / For all people born from [birth year] through 1963 and any individual with known risks for hepatitis C.  Skin self-exam. / Monthly.  Influenza vaccine. / Every year.  Tetanus, diphtheria, and acellular pertussis (Tdap/Td) vaccine.** / Consult your health care provider. Pregnant women should receive 1 dose of Tdap vaccine during each pregnancy. 1 dose of Td every 10 years.  Varicella vaccine.** / Consult your health care provider. Pregnant females who do not have evidence of immunity should receive the first dose after pregnancy.  Zoster vaccine.** / 1 dose for adults aged 46 years or older.  Measles, mumps, rubella (MMR) vaccine.** / You need at least 1 dose of MMR if you were born in 1955 or later. You may also need a 2nd dose. For females of childbearing age, rubella immunity should be determined. If there is no evidence of immunity, females who are not pregnant should be vaccinated. If there is no evidence of immunity, females who are pregnant should delay immunization until after pregnancy.  Pneumococcal 13-valent conjugate (PCV13) vaccine.** / Consult your health care provider.  Pneumococcal polysaccharide (PPSV23) vaccine.** / 1  to 2 doses if you smoke cigarettes or if you have certain conditions.  Meningococcal vaccine.** / Consult your health care provider.  Hepatitis A vaccine.** / Consult your health care provider.  Hepatitis B vaccine.** / Consult your health care provider.  Haemophilus influenzae type b (Hib) vaccine.** / Consult your health care provider. Ages 57 years and over  Blood pressure check.** / Every 1 to 2 years.  Lipid and cholesterol check.** / Every 5 years beginning at age 53 years.  Lung cancer screening. / Every year if you are aged 55 80 years and have a 30-pack-year history of smoking and currently smoke or have quit within the past 15 years.  Yearly screening is stopped once you have quit smoking for at least 15 years or develop a health problem that would prevent you from having lung cancer treatment.  Clinical breast exam.** / Every year after age 35 years.  BRCA-related cancer risk assessment.** / For women who have family members with a BRCA-related cancer (breast, ovarian, tubal, or peritoneal cancers).  Mammogram.** / Every year beginning at age 26 years and continuing for as long as you are in good health. Consult with your health care provider.  Pap test.** / Every 3 years starting at age 62 years through age 80 or 74 years with 3 consecutive normal Pap tests. Testing can be stopped between 65 and 70 years with 3 consecutive normal Pap tests and no abnormal Pap or HPV tests in the past 10 years.  HPV screening.** / Every 3 years from ages 64 years through ages 79 or 110 years with a history of 3 consecutive normal Pap tests. Testing can be stopped between 65 and 70 years with 3 consecutive normal Pap tests and no abnormal Pap or HPV tests in the past 10 years.  Fecal occult blood test (FOBT) of stool. / Every year beginning at age 46 years and continuing until age 57 years. You may not need to do this test if you get a colonoscopy every 10 years.  Flexible sigmoidoscopy or colonoscopy.** / Every 5 years for a flexible sigmoidoscopy or every 10 years for a colonoscopy beginning at age 85 years and continuing until age 9 years.  Hepatitis C blood test.** / For all people born from [birth year] through 1963 and any individual with known risks for hepatitis C.  Osteoporosis screening.** / A one-time screening for women ages 75 years and over and women at risk for fractures or osteoporosis.  Skin self-exam. / Monthly.  Influenza vaccine. / Every year.  Tetanus, diphtheria, and acellular pertussis (Tdap/Td) vaccine.** / 1 dose of Td every 10 years.  Varicella vaccine.** / Consult your health care provider.  Zoster vaccine.** / 1  dose for adults aged 66 years or older.  Pneumococcal 13-valent conjugate (PCV13) vaccine.** / Consult your health care provider.  Pneumococcal polysaccharide (PPSV23) vaccine.** / 1 dose for all adults aged 51 years and older.  Meningococcal vaccine.** / Consult your health care provider.  Hepatitis A vaccine.** / Consult your health care provider.  Hepatitis B vaccine.** / Consult your health care provider.  Haemophilus influenzae type b (Hib) vaccine.** / Consult your health care provider. ** Family history and personal history of risk and conditions may change your health care provider's recommendations. Document Released: 01/07/2002 Document Revised: 09/01/2013 Document Reviewed: 04/08/2011 [facility] Patient Information 2014 [place], [place]. Hypertension As your heart beats, it forces blood through your arteries. This force is your blood pressure. If the pressure is too high, it is  called hypertension (HTN) or high blood pressure. HTN is dangerous because you may have it and not know it. High blood pressure may mean that your heart has to work harder to pump blood. Your arteries may be narrow or stiff. The extra work puts you at risk for heart disease, stroke, and other problems.  Blood pressure consists of two numbers, a higher number over a lower, 110/72, for example. It is stated as "110 over 72." The ideal is below 120 for the top number (systolic) and under 80 for the bottom (diastolic). Write down your blood pressure today. You should pay close attention to your blood pressure if you have certain conditions such as:  Heart failure.  Prior heart attack.  Diabetes  Chronic kidney disease.  Prior stroke.  Multiple risk factors for heart disease. To see if you have HTN, your blood pressure should be measured while you are seated with your arm held at the level of the heart. It should be measured at least twice. A one-time elevated blood pressure reading (especially in the  Emergency Department) does not mean that you need treatment. There may be conditions in which the blood pressure is different between your right and left arms. It is important to see your caregiver soon for a recheck. Most people have essential hypertension which means that there is not a specific cause. This type of high blood pressure may be lowered by changing lifestyle factors such as:  Stress.  Smoking.  Lack of exercise.  Excessive weight.  Drug/tobacco/alcohol use.  Eating less salt. Most people do not have symptoms from high blood pressure until it has caused damage to the body. Effective treatment can often prevent, delay or reduce that damage. TREATMENT  When a cause has been identified, treatment for high blood pressure is directed at the cause. There are a large number of medications to treat HTN. These fall into several categories, and your caregiver will help you select the medicines that are best for you. Medications may have side effects. You should review side effects with your caregiver. If your blood pressure stays high after you have made lifestyle changes or started on medicines,   Your medication(s) may need to be changed.  Other problems may need to be addressed.  Be certain you understand your prescriptions, and know how and when to take your medicine.  Be sure to follow up with your caregiver within the time frame advised (usually within two weeks) to have your blood pressure rechecked and to review your medications.  If you are taking more than one medicine to lower your blood pressure, make sure you know how and at what times they should be taken. Taking two medicines at the same time can result in blood pressure that is too low. SEEK IMMEDIATE MEDICAL CARE IF:  You develop a severe headache, blurred or changing vision, or confusion.  You have unusual weakness or numbness, or a faint feeling.  You have severe chest or abdominal pain, vomiting, or breathing  problems. MAKE SURE YOU:   Understand these instructions.  Will watch your condition.  Will get help right away if you are not doing well or get worse. Document Released: 11/11/2005 Document Revised: 02/03/2012 Document Reviewed: 07/01/2008 [facility] Patient Information 2014 [place].

## 2014-04-21 NOTE — Progress Notes (Signed)
Subjective:    Patient ID: Claudia Lara, female    DOB: 02-Sep-1932, 80 y.o.   MRN: [id number]  Hypertension This is a chronic problem. The current episode started more than 1 year ago. The problem has been gradually improving since onset. The problem is controlled. Associated symptoms include malaise/fatigue. Pertinent negatives include no anxiety, blurred vision, chest pain, headaches, neck pain, orthopnea, palpitations, peripheral edema, PND, shortness of breath or sweats. Agents associated with hypertension include steroids and NSAIDs. Past treatments include diuretics and beta blockers. The current treatment provides moderate improvement. Compliance problems include exercise and diet.       Review of Systems  Constitutional: Positive for malaise/fatigue. Negative for fever, chills, diaphoresis, appetite change and fatigue.  HENT: Negative.   Eyes: Negative.  Negative for blurred vision, photophobia, redness and visual disturbance.  Respiratory: Negative.  Negative for cough, choking, chest tightness, shortness of breath, wheezing and stridor.   Cardiovascular: Negative.  Negative for chest pain, palpitations, orthopnea, leg swelling and PND.  Gastrointestinal: Negative.  Negative for nausea, vomiting, abdominal pain, diarrhea, constipation and blood in stool.  Endocrine: Negative.   Genitourinary: Negative.   Musculoskeletal: Positive for arthralgias. Negative for back pain, gait problem, joint swelling, myalgias, neck pain and neck stiffness.       She has severe arthritis pain and trigger mechanisms in several of her fingers.  Skin: Negative.  Negative for rash.  Allergic/Immunologic: Negative.   Neurological: Negative.  Negative for dizziness, tremors, syncope, weakness, light-headedness, numbness and headaches.  Hematological: Negative.  Negative for adenopathy. Does not bruise/bleed easily.  Psychiatric/Behavioral: Negative.        Objective:   Physical Exam  Vitals  reviewed. Constitutional: She is oriented to person, place, and time. She appears well-developed and well-nourished. No distress.  HENT:  Head: Normocephalic and atraumatic.  Mouth/Throat: Oropharynx is clear and moist. No oropharyngeal exudate.  Eyes: Conjunctivae are normal. Right eye exhibits no discharge. Left eye exhibits no discharge. No scleral icterus.  Neck: Normal range of motion. Neck supple. No JVD present. No tracheal deviation present. No thyromegaly present.  Cardiovascular: Normal rate, regular rhythm, S1 normal, S2 normal and intact distal pulses.  Exam reveals no gallop, no S3, no S4 and no friction rub.   Murmur heard.  Decrescendo systolic murmur is present with a grade of 1/6   No diastolic murmur is present  Pulses:      Carotid pulses are 1+ on the right side, and 1+ on the left side.      Radial pulses are 1+ on the right side, and 1+ on the left side.       Femoral pulses are 1+ on the right side, and 1+ on the left side.      Popliteal pulses are 1+ on the right side, and 1+ on the left side.       Dorsalis pedis pulses are 1+ on the right side, and 1+ on the left side.       Posterior tibial pulses are 1+ on the right side, and 1+ on the left side.  Pulmonary/Chest: Effort normal and breath sounds normal. No stridor. No respiratory distress. She has no wheezes. She has no rales. She exhibits no tenderness.  Abdominal: Soft. Bowel sounds are normal. She exhibits no distension and no mass. There is no tenderness. There is no rebound and no guarding.  Musculoskeletal: Normal range of motion. She exhibits no edema and no tenderness.  She has prominent H/B nodules over  her PIP and DIP joints.  Lymphadenopathy:    She has no cervical adenopathy.  Neurological: She is oriented to person, place, and time.  Skin: Skin is warm and dry. No rash noted. She is not diaphoretic. No erythema. No pallor.  Psychiatric: She has a normal mood and affect. Her behavior is normal.  Judgment and thought content normal.     Lab Results  Component Value Date   WBC 9.7 04/13/2014   HGB 10.4* 04/13/2014   HCT 30.3* 04/13/2014   PLT 215 04/13/2014   GLUCOSE 96 02/14/2014   CHOL 141 07/16/2012   TRIG 71.0 07/16/2012   HDL 52.00 07/16/2012   LDLCALC 75 07/16/2012   ALT 15 02/14/2014   AST 18 02/14/2014   NA 143 02/14/2014   K 4.9 02/14/2014   CL 109 07/07/2013   CREATININE 1.1 02/14/2014   BUN 29.8* 02/14/2014   CO2 25 02/14/2014   TSH 1.44 12/02/2011   INR 1.06 10/19/2010   HGBA1C 5.1 12/02/2011       Assessment & Plan:

## 2014-04-22 ENCOUNTER — Encounter: Payer: Self-pay | Admitting: Internal Medicine

## 2014-04-22 ENCOUNTER — Telehealth: Payer: Self-pay | Admitting: Internal Medicine

## 2014-04-22 LAB — VITAMIN D 25 HYDROXY (VIT D DEFICIENCY, FRACTURES): VIT D 25 HYDROXY: 58 ng/mL (ref 30–89)

## 2014-04-22 NOTE — Telephone Encounter (Signed)
Relevant patient education assigned to patient using Emmi. ° °

## 2014-04-27 ENCOUNTER — Ambulatory Visit (HOSPITAL_COMMUNITY): Payer: Medicare Other | Attending: Cardiology | Admitting: *Deleted

## 2014-04-27 DIAGNOSIS — I6529 Occlusion and stenosis of unspecified carotid artery: Secondary | ICD-10-CM | POA: Insufficient documentation

## 2014-04-27 NOTE — Progress Notes (Signed)
Carotid duplex complete 

## 2014-04-28 ENCOUNTER — Ambulatory Visit
Admission: RE | Admit: 2014-04-28 | Discharge: 2014-04-28 | Disposition: A | Discharge status: Home / Self Care | Payer: Medicare Other | Source: Ambulatory Visit | Attending: Internal Medicine | Admitting: Internal Medicine

## 2014-04-28 ENCOUNTER — Encounter: Payer: Self-pay | Admitting: Internal Medicine

## 2014-04-28 DIAGNOSIS — M8580 Other specified disorders of bone density and structure, unspecified site: Secondary | ICD-10-CM

## 2014-04-28 LAB — HM DEXA SCAN: HM Dexa Scan: -1.9

## 2014-04-29 ENCOUNTER — Other Ambulatory Visit: Payer: Medicare Other

## 2014-05-02 ENCOUNTER — Other Ambulatory Visit: Payer: Medicare Other

## 2014-05-03 ENCOUNTER — Other Ambulatory Visit: Payer: Medicare Other

## 2014-05-12 ENCOUNTER — Other Ambulatory Visit (HOSPITAL_BASED_OUTPATIENT_CLINIC_OR_DEPARTMENT_OTHER): Payer: Medicare Other

## 2014-05-12 DIAGNOSIS — D649 Anemia, unspecified: Secondary | ICD-10-CM

## 2014-05-12 LAB — CBC WITH DIFFERENTIAL/PLATELET
BASO%: 0.2 % (ref 0.0–2.0)
Basophils Absolute: 0 10*3/uL (ref 0.0–0.1)
EOS%: 1.9 % (ref 0.0–7.0)
Eosinophils Absolute: 0.2 10*3/uL (ref 0.0–0.5)
HCT: 29.6 % — ABNORMAL LOW (ref 34.8–46.6)
HGB: 9.8 g/dL — ABNORMAL LOW (ref 11.6–15.9)
LYMPH%: 10.1 % — ABNORMAL LOW (ref 14.0–49.7)
MCH: 31 pg (ref 25.1–34.0)
MCHC: 33.1 g/dL (ref 31.5–36.0)
MCV: 93.7 fL (ref 79.5–101.0)
MONO#: 0.6 10*3/uL (ref 0.1–0.9)
MONO%: 6.5 % (ref 0.0–14.0)
NEUT%: 81.3 % — ABNORMAL HIGH (ref 38.4–76.8)
NEUTROS ABS: 8 10*3/uL — AB (ref 1.5–6.5)
PLATELETS: 215 10*3/uL (ref 145–400)
RBC: 3.16 10*6/uL — AB (ref 3.70–5.45)
RDW: 19 % — AB (ref 11.2–14.5)
WBC: 9.9 10*3/uL (ref 3.9–10.3)
lymph#: 1 10*3/uL (ref 0.9–3.3)

## 2014-05-19 ENCOUNTER — Telehealth: Payer: Self-pay

## 2014-05-19 NOTE — Telephone Encounter (Signed)
Pt called asking if she needs to change her prednisone dose. S/w dr Juliann Mule and he instructed pt to take 1 tablet daily (down from BID). We will reevaluate at her next visit 06/09/14. Pt expressed understanding.

## 2014-05-20 ENCOUNTER — Telehealth: Payer: Self-pay | Admitting: Internal Medicine

## 2014-05-20 NOTE — Telephone Encounter (Signed)
no answer....mailed pt appt sched and letter °

## 2014-06-02 ENCOUNTER — Ambulatory Visit: Payer: Medicare Other | Attending: Orthopaedic Surgery | Admitting: Physical Therapy

## 2014-06-02 DIAGNOSIS — M25669 Stiffness of unspecified knee, not elsewhere classified: Secondary | ICD-10-CM | POA: Insufficient documentation

## 2014-06-07 ENCOUNTER — Ambulatory Visit (HOSPITAL_BASED_OUTPATIENT_CLINIC_OR_DEPARTMENT_OTHER): Payer: Medicare Other | Admitting: Internal Medicine

## 2014-06-07 ENCOUNTER — Other Ambulatory Visit (HOSPITAL_BASED_OUTPATIENT_CLINIC_OR_DEPARTMENT_OTHER): Payer: Medicare Other

## 2014-06-07 ENCOUNTER — Telehealth: Payer: Self-pay | Admitting: Internal Medicine

## 2014-06-07 VITALS — BP 130/42 | HR 76 | Temp 98.2°F | Resp 18 | Ht 63.0 in | Wt 179.9 lb

## 2014-06-07 DIAGNOSIS — N183 Chronic kidney disease, stage 3 unspecified: Secondary | ICD-10-CM

## 2014-06-07 DIAGNOSIS — M159 Polyosteoarthritis, unspecified: Secondary | ICD-10-CM

## 2014-06-07 DIAGNOSIS — R609 Edema, unspecified: Secondary | ICD-10-CM

## 2014-06-07 DIAGNOSIS — D649 Anemia, unspecified: Secondary | ICD-10-CM

## 2014-06-07 DIAGNOSIS — R17 Unspecified jaundice: Secondary | ICD-10-CM

## 2014-06-07 LAB — CBC WITH DIFFERENTIAL/PLATELET
BASO%: 0.3 % (ref 0.0–2.0)
Basophils Absolute: 0 10*3/uL (ref 0.0–0.1)
EOS ABS: 0.2 10*3/uL (ref 0.0–0.5)
EOS%: 1.8 % (ref 0.0–7.0)
HCT: 28.2 % — ABNORMAL LOW (ref 34.8–46.6)
HGB: 9.4 g/dL — ABNORMAL LOW (ref 11.6–15.9)
LYMPH%: 8.7 % — ABNORMAL LOW (ref 14.0–49.7)
MCH: 31.1 pg (ref 25.1–34.0)
MCHC: 33.3 g/dL (ref 31.5–36.0)
MCV: 93.4 fL (ref 79.5–101.0)
MONO#: 0.6 10*3/uL (ref 0.1–0.9)
MONO%: 6.8 % (ref 0.0–14.0)
NEUT%: 82.4 % — ABNORMAL HIGH (ref 38.4–76.8)
NEUTROS ABS: 7.3 10*3/uL — AB (ref 1.5–6.5)
Platelets: 216 10*3/uL (ref 145–400)
RBC: 3.02 10*6/uL — AB (ref 3.70–5.45)
RDW: 19.6 % — ABNORMAL HIGH (ref 11.2–14.5)
WBC: 8.8 10*3/uL (ref 3.9–10.3)
lymph#: 0.8 10*3/uL — ABNORMAL LOW (ref 0.9–3.3)

## 2014-06-07 LAB — LACTATE DEHYDROGENASE (CC13): LDH: 288 U/L — ABNORMAL HIGH (ref 125–245)

## 2014-06-07 LAB — HAPTOGLOBIN

## 2014-06-07 NOTE — Telephone Encounter (Signed)
Pt confirmed labs/ov per 07/14 POF, gave pt AVS, will call pt back w/referral  for Dr. Ardis Hughs...Marland KitchenMarland KitchenKJ

## 2014-06-07 NOTE — Patient Instructions (Signed)
Darbepoetin Alfa injection What is this medicine? DARBEPOETIN ALFA (dar be POE e tin AL fa) helps your body make more red blood cells. It is used to treat anemia caused by chronic kidney failure and chemotherapy. This medicine may be used for other purposes; ask your health care provider or pharmacist if you have questions. COMMON BRAND NAME(S): Aranesp What should I tell my health care provider before I take this medicine? They need to know if you have any of these conditions: -blood clotting disorders or history of blood clots -cancer patient not on chemotherapy -cystic fibrosis -heart disease, such as angina, heart failure, or a history of a heart attack -hemoglobin level of 12 g/dL or greater -high blood pressure -low levels of folate, iron, or vitamin B12 -seizures -an unusual or allergic reaction to darbepoetin, erythropoietin, albumin, hamster proteins, latex, other medicines, foods, dyes, or preservatives -pregnant or trying to get pregnant -breast-feeding How should I use this medicine? This medicine is for injection into a vein or under the skin. It is usually given by a health care professional in a hospital or clinic setting. If you get this medicine at home, you will be taught how to prepare and give this medicine. Do not shake the solution before you withdraw a dose. Use exactly as directed. Take your medicine at regular intervals. Do not take your medicine more often than directed. It is important that you put your used needles and syringes in a special sharps container. Do not put them in a trash can. If you do not have a sharps container, call your pharmacist or healthcare provider to get one. Talk to your pediatrician regarding the use of this medicine in children. While this medicine may be used in children as young as 1 year for selected conditions, precautions do apply. Overdosage: If you think you have taken too much of this medicine contact a poison control center or  emergency room at once. NOTE: This medicine is only for you. Do not share this medicine with others. What if I miss a dose? If you miss a dose, take it as soon as you can. If it is almost time for your next dose, take only that dose. Do not take double or extra doses. What may interact with this medicine? Do not take this medicine with any of the following medications: -epoetin alfa This list may not describe all possible interactions. Give your health care provider a list of all the medicines, herbs, non-prescription drugs, or dietary supplements you use. Also tell them if you smoke, drink alcohol, or use illegal drugs. Some items may interact with your medicine. What should I watch for while using this medicine? Visit your prescriber or health care professional for regular checks on your progress and for the needed blood tests and blood pressure measurements. It is especially important for the doctor to make sure your hemoglobin level is in the desired range, to limit the risk of potential side effects and to give you the best benefit. Keep all appointments for any recommended tests. Check your blood pressure as directed. Ask your doctor what your blood pressure should be and when you should contact him or her. As your body makes more red blood cells, you may need to take iron, folic acid, or vitamin B supplements. Ask your doctor or health care provider which products are right for you. If you have kidney disease continue dietary restrictions, even though this medication can make you feel better. Talk with your doctor or health   care professional about the foods you eat and the vitamins that you take. What side effects may I notice from receiving this medicine? Side effects that you should report to your doctor or health care professional as soon as possible: -allergic reactions like skin rash, itching or hives, swelling of the face, lips, or tongue -breathing problems -changes in vision -chest  pain -confusion, trouble speaking or understanding -feeling faint or lightheaded, falls -high blood pressure -muscle aches or pains -pain, swelling, warmth in the leg -rapid weight gain -severe headaches -sudden numbness or weakness of the face, arm or leg -trouble walking, dizziness, loss of balance or coordination -seizures (convulsions) -swelling of the ankles, feet, hands -unusually weak or tired Side effects that usually do not require medical attention (report to your doctor or health care professional if they continue or are bothersome): -diarrhea -fever, chills (flu-like symptoms) -headaches -nausea, vomiting -redness, stinging, or swelling at site where injected This list may not describe all possible side effects. Call your doctor for medical advice about side effects. You may report side effects to FDA at 1-800-FDA-1088. Where should I keep my medicine? Keep out of the reach of children. Store in a refrigerator between 2 and 8 degrees C (36 and 46 degrees F). Do not freeze. Do not shake. Throw away any unused portion if using a single-dose vial. Throw away any unused medicine after the expiration date. NOTE: This sheet is a summary. It may not cover all possible information. If you have questions about this medicine, talk to your doctor, pharmacist, or health care provider.  2015, [employer]. (2008-10-25 10:23:57)  

## 2014-06-08 ENCOUNTER — Encounter: Payer: Self-pay | Admitting: Internal Medicine

## 2014-06-08 NOTE — Progress Notes (Signed)
[place] OFFICE PROGRESS NOTE  [name], MD [facility] [address] [place] [address]  DIAGNOSIS: ANEMIA - Plan: CBC with Differential, Basic metabolic panel (Bmet) - [facility], Ambulatory referral to Gastroenterology, Reticulocyte Count, Lactate dehydrogenase (LDH) - [facility], Haptoglobin  Kidney disease, chronic, stage III (GFR 30-59 ml/min)  GEN OSTEOARTHROSIS INVOLVING MULTIPLE SITES  Chief Complaint  Patient presents with  . Anemia    CURRENT TREATMENT:  Prednisone 5 mg bid started on 03/16/2014. Tapering off starting 05/16/2014 with prednisone 5 mg once daily.   INTERVAL HISTORY: Claudia Lara 80 y.o. female with a history of hypertension who presented on  08/05/2013 for further evaluation of anemia by her PCP Dr. Linda Hedges is here for follow-up.  She was seen by me on 04/13/2014.  Initially, she reports taking supplemental iron from time to time with her first low hemoglobin about 5 years ago. She reported recently having increased dyspnea on exertion. She used to walk about a mile per day but quit about 2 years ago due to increased DOE. She denied a history of blood transfusions outside of when she received packed red blood cells intraoperatively in November 2011 during her right knee replacement surgery. She denied a history of blood clots. She reported being up-to-date on her screening exams with her last colonoscopy at age 77. Her mammogram was done on 08/18/2013 and was negative.   Today, she is feeling fine. She is compliant with prednisone daily.  She notes being off of her lasix and now has lower extremity swelling.  She was instructed to start tapering the steroids.    MEDICAL HISTORY: Past Medical History  Diagnosis Date  . ANEMIA 08/22/2009  . Unspecified essential hypertension 10/19/2007  . CAROTID ARTERY STENOSIS 01/16/2010  . PVD 10/06/2007  . HEMORRHOIDS, INTERNAL     surgery was in the 90's (per patient)  . GEN OSTEOARTHROSIS INVOLVING  MULTIPLE SITES 11/11/2008  . NECK PAIN, ACUTE 12/28/2009  . PERIPHERAL EDEMA 10/06/2007  . DYSPNEA ON EXERTION 11/16/2007    INTERIM HISTORY: has ANEMIA; Unspecified essential hypertension; CAROTID ARTERY STENOSIS; GEN OSTEOARTHROSIS INVOLVING MULTIPLE SITES; Routine health maintenance; Chest pain with high risk for cardiac etiology; Senile osteopenia; Trigger finger, acquired; Hyperlipidemia LDL goal < 130; and Kidney disease, chronic, stage III (GFR 30-59 ml/min) on her problem list.    ALLERGIES:  is allergic to sulfonamide derivatives.  MEDICATIONS: has a current medication list which includes the following prescription(s): vitamin c, calcium 600 + minerals, carvedilol, cholecalciferol, glucosamine 1500 complex, omeprazole, and tobramycin-dexamethasone.  SURGICAL HISTORY:  Past Surgical History  Procedure Laterality Date  . Hyperplastic colon polyps, removed  2007    By Dr. Penelope Coop  . Oophorectomy    . Rotator cuff repair  2004    left (Dr. Durward Fortes)  . Abdominal hysterectomy    . Joint replacement  2011    right knee    REVIEW OF SYSTEMS:   Constitutional: Denies fevers, chills or abnormal weight loss Eyes: Denies blurriness of vision Ears, nose, mouth, throat, and face: Denies mucositis or sore throat Respiratory: Denies cough, dyspnea or wheezes Cardiovascular: Denies palpitation, chest discomfort or lower extremity swelling Gastrointestinal:  Denies nausea, heartburn or change in bowel habits Skin: Denies abnormal skin rashes Lymphatics: Denies new lymphadenopathy or easy bruising Neurological:Denies numbness, tingling or new weaknesses Behavioral/Psych: Mood is stable, no new changes  All other systems were reviewed with the patient and are negative.  PHYSICAL EXAMINATION: ECOG PERFORMANCE STATUS: 1 - Symptomatic but  completely ambulatory  Blood pressure 130/42, pulse 76, temperature 98.2 F (36.8 C), temperature source Oral, resp. rate 18, height 5\' 3"  (1.6 m), weight  179 lb 14.4 oz (81.602 kg), SpO2 98.00%.  GENERAL:alert, no distress and comfortable SKIN: skin color, texture, turgor are normal, no rashes or significant lesions EYES: normal, Conjunctiva are pink and non-injected, sclera clear OROPHARYNX:no exudate, no erythema and lips, buccal mucosa, and tongue normal  NECK: supple, thyroid normal size, non-tender, without nodularity LYMPH:  no palpable lymphadenopathy in the cervical, axillary or supraclavicular LUNGS: clear to auscultation with normal breathing effort, no wheezes or rhonchi HEART: regular rate & rhythm and no murmurs and 1-2 + lower extremity edema ABDOMEN:abdomen soft, non-tender and normal bowel sounds Musculoskeletal:no cyanosis of digits and no clubbing ; severe athritic changes in the hands.  NEURO: alert & oriented x 3 with fluent speech, no focal motor/sensory deficits  Labs:  Lab Results  Component Value Date   WBC 8.8 06/07/2014   HGB 9.4* 06/07/2014   HCT 28.2* 06/07/2014   MCV 93.4 06/07/2014   PLT 216 06/07/2014   NEUTROABS 7.3* 06/07/2014      Chemistry      Component Value Date/Time   NA 141 04/21/2014 1037   NA 143 02/14/2014 1057   K 4.9 04/21/2014 1037   K 4.9 02/14/2014 1057   CL 108 04/21/2014 1037   CO2 26 04/21/2014 1037   CO2 25 02/14/2014 1057   BUN 31* 04/21/2014 1037   BUN 29.8* 02/14/2014 1057   CREATININE 1.4* 04/21/2014 1037   CREATININE 1.1 02/14/2014 1057      Component Value Date/Time   CALCIUM 9.0 04/21/2014 1037   CALCIUM 9.2 02/14/2014 1057   ALKPHOS 57 04/21/2014 1037   ALKPHOS 80 02/14/2014 1057   AST 21 04/21/2014 1037   AST 18 02/14/2014 1057   ALT 26 04/21/2014 1037   ALT 15 02/14/2014 1057   BILITOT 1.5* 04/21/2014 1037   BILITOT 1.54* 02/14/2014 1057      CBC:  Recent Labs Lab 06/07/14 1300  WBC 8.8  NEUTROABS 7.3*  HGB 9.4*  HCT 28.2*  MCV 93.4  PLT 216   Results for VELLA, COLQUITT (MRN [id number]) as of 04/13/2014 11:00  Ref. Range 02/14/2014 10:57 03/10/2014 15:32 03/24/2014 09:53   Haptoglobin Latest Range: 45-215 mg/dL 31 (L) 27 (L) 79    Studies:  No results found.   RADIOGRAPHIC STUDIES: No results found.  ASSESSMENT: Claudia Lara 80 y.o. female with a history of ANEMIA - Plan: CBC with Differential, Basic metabolic panel (Bmet) - [facility], Ambulatory referral to Gastroenterology, Reticulocyte Count, Lactate dehydrogenase (LDH) - [facility], Haptoglobin  Kidney disease, chronic, stage III (GFR 30-59 ml/min)  GEN OSTEOARTHROSIS INVOLVING MULTIPLE SITES   PLAN:  1. Normocytic anemia NOS - Her hemogloblin is 9.4 today.  Her iron studies were negative for iron deficiency. Her prior retic count % is 7% less than 9% more typical of hemolysis.  Her LDH is 288 and her haptoglobin is low.  We started prednisone 5 mg bid for hemolysis given decreased haptoglobin and elevated retic.  Her hemoglobin improved slightly but her hemoglobin decreased. We will follow hemolytic panel including retic, ldh and bili and haptoglobin.  If persistently consistent with hemolysis, can consider IVIG or rituximab.  She will unlikely tolerate prednisone at high doses, i.e., history of osteopenia.  We will refer back to GI for evaluation given her last follow up was 5 years ago to exclude GI causes for  her anemia.  --  Sincer her ferritin is greater than 100 and her Fe/TIBC is greater than 20%, she is less likely to benefit from iron supplementation and was instructed to stop.  --Other considerations also include a primary bone marrow problem given her age.  With both her platlets and WBC being normal points against a primary bone marrow disorder. With normal LFTs and no reported history of hepatic injury, primary liver disease is also less likely.  Her creatinine clearance is greater than 30 mL/min which typically suggestive of anemia of chronic kidney disease.  We checked an erythropoietin level and it is less than 500 mU/mL, she might benefit from epo shots. We provided a handout about aranesp.   2.  Hyperbilirubinemia. Likely secondary to #1. Should improve with prednisone as well.   3. Lower extremity edema. -- Patient canceled to restart her lasix.  She is tapering off prednisone.   4. Followup. RTC in 1 months with repeat laboratories and in 4 weeks for repeat labs.    All questions were answered. The patient knows to call the clinic with any problems, questions or concerns. We can certainly see the patient much sooner if necessary.  I spent 15 minutes counseling the patient face to face. The total time spent in the appointment was 25 minutes.    Jaimie Pippins, MD 06/08/2014 6:33 AM

## 2014-06-09 ENCOUNTER — Other Ambulatory Visit: Payer: Medicare Other

## 2014-06-09 ENCOUNTER — Ambulatory Visit: Payer: Medicare Other

## 2014-06-10 ENCOUNTER — Other Ambulatory Visit: Payer: Self-pay | Admitting: *Deleted

## 2014-06-10 MED ORDER — MELOXICAM 15 MG PO TABS: 15.0000 mg | ORAL_TABLET | Freq: Every day | ORAL | Status: DC

## 2014-06-14 ENCOUNTER — Other Ambulatory Visit: Payer: Self-pay

## 2014-06-14 ENCOUNTER — Ambulatory Visit (HOSPITAL_BASED_OUTPATIENT_CLINIC_OR_DEPARTMENT_OTHER): Payer: Medicare Other

## 2014-06-14 DIAGNOSIS — R42 Dizziness and giddiness: Secondary | ICD-10-CM

## 2014-06-14 DIAGNOSIS — D649 Anemia, unspecified: Secondary | ICD-10-CM

## 2014-06-14 DIAGNOSIS — N183 Chronic kidney disease, stage 3 unspecified: Secondary | ICD-10-CM

## 2014-06-14 LAB — CBC WITH DIFFERENTIAL/PLATELET
BASO%: 1 % (ref 0.0–2.0)
Basophils Absolute: 0.1 10*3/uL (ref 0.0–0.1)
EOS%: 2.4 % (ref 0.0–7.0)
Eosinophils Absolute: 0.2 10*3/uL (ref 0.0–0.5)
HEMATOCRIT: 29.2 % — AB (ref 34.8–46.6)
HGB: 9.8 g/dL — ABNORMAL LOW (ref 11.6–15.9)
LYMPH%: 10 % — ABNORMAL LOW (ref 14.0–49.7)
MCH: 31.4 pg (ref 25.1–34.0)
MCHC: 33.7 g/dL (ref 31.5–36.0)
MCV: 93.1 fL (ref 79.5–101.0)
MONO#: 0.6 10*3/uL (ref 0.1–0.9)
MONO%: 6.5 % (ref 0.0–14.0)
NEUT#: 7.4 10*3/uL — ABNORMAL HIGH (ref 1.5–6.5)
NEUT%: 80.1 % — AB (ref 38.4–76.8)
Platelets: 248 10*3/uL (ref 145–400)
RBC: 3.13 10*6/uL — AB (ref 3.70–5.45)
RDW: 19.2 % — ABNORMAL HIGH (ref 11.2–14.5)
WBC: 9.2 10*3/uL (ref 3.9–10.3)
lymph#: 0.9 10*3/uL (ref 0.9–3.3)

## 2014-06-14 MED ORDER — SODIUM CHLORIDE 0.9 % IV SOLN
INTRAVENOUS | Status: AC
  Administered 2014-06-14: 16:00:00 via INTRAVENOUS

## 2014-06-14 NOTE — Patient Instructions (Signed)

## 2014-06-14 NOTE — Progress Notes (Signed)
Pt called re: getting dizzy getting OOB or up from a chair. Her hgb was 9.4 last week. We requested she come in. Orthostatic vs were done. Skin is not tenting. Pt has no other symptoms. She says her head is spinning and not the room.  LLE is still edematous. Pt is on prednisone taper and lasix. She is also taking coreg. S/w dr Juliann Mule about pt being dizzy, vs reviewed. Pt did get dizzy changing from laying to sitting and sitting to standing. 1 liter NS ordered per Dr Juliann Mule. Pt escorted to infusion room.

## 2014-06-16 ENCOUNTER — Encounter: Payer: Self-pay | Admitting: Gastroenterology

## 2014-06-16 ENCOUNTER — Telehealth: Payer: Self-pay | Admitting: Internal Medicine

## 2014-06-16 NOTE — Telephone Encounter (Signed)
Spk w/Esmeralda set apt for pt w/PA JZ 08/04 @9 :30, lft msg for pt comfirming this apt,  mailed updated schedule to pt.[name]..KJ

## 2014-06-28 ENCOUNTER — Ambulatory Visit: Payer: Self-pay | Admitting: Gastroenterology

## 2014-06-29 ENCOUNTER — Encounter: Payer: Self-pay | Admitting: Internal Medicine

## 2014-06-29 ENCOUNTER — Ambulatory Visit (INDEPENDENT_AMBULATORY_CARE_PROVIDER_SITE_OTHER): Payer: Medicare Other | Admitting: Internal Medicine

## 2014-06-29 ENCOUNTER — Other Ambulatory Visit (INDEPENDENT_AMBULATORY_CARE_PROVIDER_SITE_OTHER): Payer: Medicare Other

## 2014-06-29 VITALS — BP 120/58 | HR 86 | Temp 98.5°F | Resp 16 | Ht 63.0 in | Wt 179.0 lb

## 2014-06-29 DIAGNOSIS — I10 Essential (primary) hypertension: Secondary | ICD-10-CM

## 2014-06-29 DIAGNOSIS — R609 Edema, unspecified: Secondary | ICD-10-CM | POA: Insufficient documentation

## 2014-06-29 DIAGNOSIS — I350 Nonrheumatic aortic (valve) stenosis: Secondary | ICD-10-CM | POA: Insufficient documentation

## 2014-06-29 DIAGNOSIS — N183 Chronic kidney disease, stage 3 unspecified: Secondary | ICD-10-CM

## 2014-06-29 DIAGNOSIS — I519 Heart disease, unspecified: Secondary | ICD-10-CM

## 2014-06-29 DIAGNOSIS — I5189 Other ill-defined heart diseases: Secondary | ICD-10-CM | POA: Insufficient documentation

## 2014-06-29 DIAGNOSIS — I359 Nonrheumatic aortic valve disorder, unspecified: Secondary | ICD-10-CM

## 2014-06-29 LAB — URINALYSIS, ROUTINE W REFLEX MICROSCOPIC
Bilirubin Urine: NEGATIVE
HGB URINE DIPSTICK: NEGATIVE
Ketones, ur: NEGATIVE
NITRITE: NEGATIVE
Specific Gravity, Urine: 1.01 (ref 1.000–1.030)
TOTAL PROTEIN, URINE-UPE24: NEGATIVE
UROBILINOGEN UA: 0.2 (ref 0.0–1.0)
Urine Glucose: NEGATIVE
pH: 5.5 (ref 5.0–8.0)

## 2014-06-29 LAB — CBC WITH DIFFERENTIAL/PLATELET
BASOS ABS: 0 10*3/uL (ref 0.0–0.1)
Basophils Relative: 0.6 % (ref 0.0–3.0)
EOS PCT: 5.4 % — AB (ref 0.0–5.0)
Eosinophils Absolute: 0.4 10*3/uL (ref 0.0–0.7)
HCT: 28.8 % — ABNORMAL LOW (ref 36.0–46.0)
Hemoglobin: 10 g/dL — ABNORMAL LOW (ref 12.0–15.0)
Lymphocytes Relative: 14.9 % (ref 12.0–46.0)
Lymphs Abs: 1 10*3/uL (ref 0.7–4.0)
MCHC: 34.8 g/dL (ref 30.0–36.0)
MCV: 91 fl (ref 78.0–100.0)
MONO ABS: 0.6 10*3/uL (ref 0.1–1.0)
Monocytes Relative: 8.9 % (ref 3.0–12.0)
Neutro Abs: 4.7 10*3/uL (ref 1.4–7.7)
Neutrophils Relative %: 70.2 % (ref 43.0–77.0)
PLATELETS: 234 10*3/uL (ref 150.0–400.0)
RBC: 3.16 Mil/uL — ABNORMAL LOW (ref 3.87–5.11)
RDW: 19.3 % — ABNORMAL HIGH (ref 11.5–15.5)
WBC: 6.6 10*3/uL (ref 4.0–10.5)

## 2014-06-29 LAB — COMPREHENSIVE METABOLIC PANEL
ALT: 15 U/L (ref 0–35)
AST: 25 U/L (ref 0–37)
Albumin: 4 g/dL (ref 3.5–5.2)
Alkaline Phosphatase: 65 U/L (ref 39–117)
BUN: 27 mg/dL — ABNORMAL HIGH (ref 6–23)
CO2: 26 meq/L (ref 19–32)
Calcium: 8.8 mg/dL (ref 8.4–10.5)
Chloride: 107 mEq/L (ref 96–112)
Creatinine, Ser: 1.3 mg/dL — ABNORMAL HIGH (ref 0.4–1.2)
GFR: 43 mL/min — AB (ref >60.00)
GLUCOSE: 118 mg/dL — AB (ref 70–99)
POTASSIUM: 4.3 meq/L (ref 3.5–5.1)
SODIUM: 140 meq/L (ref 135–145)
TOTAL PROTEIN: 6.1 g/dL (ref 6.0–8.3)
Total Bilirubin: 1.7 mg/dL — ABNORMAL HIGH (ref 0.2–1.2)

## 2014-06-29 MED ORDER — FUROSEMIDE 40 MG PO TABS: 40.0000 mg | ORAL_TABLET | Freq: Two times a day (BID) | ORAL | Status: DC

## 2014-06-29 NOTE — Assessment & Plan Note (Signed)
I will recheck her renal function and have asked her to stop taking nsaids

## 2014-06-29 NOTE — Assessment & Plan Note (Signed)
Will increase the dose of lasix to help control the symptoms

## 2014-06-29 NOTE — Patient Instructions (Signed)

## 2014-06-29 NOTE — Progress Notes (Signed)
Pre visit review using our clinic review tool, if applicable. No additional management support is needed unless otherwise documented below in the visit note. 

## 2014-06-29 NOTE — Progress Notes (Signed)
   Subjective:    Patient ID: Claudia Lara, female    DOB: 11-01-1932, 80 y.o.   MRN: [id number]  Hypertension This is a chronic problem. The problem is unchanged. Associated symptoms include malaise/fatigue and peripheral edema. Pertinent negatives include no anxiety, blurred vision, chest pain, headaches, neck pain, orthopnea, palpitations, PND, shortness of breath or sweats. Agents associated with hypertension include NSAIDs. Past treatments include beta blockers and diuretics. The current treatment provides moderate improvement. There are no compliance problems.  Hypertensive end-organ damage includes kidney disease, heart failure and left ventricular hypertrophy. There is no history of angina, CAD/MI, CVA, PVD or retinopathy. Identifiable causes of hypertension include chronic renal disease.      Review of Systems  Constitutional: Positive for malaise/fatigue and fatigue. Negative for fever, chills, diaphoresis and appetite change.  HENT: Negative.   Eyes: Negative.  Negative for blurred vision.  Respiratory: Negative.  Negative for apnea, cough, choking, chest tightness, shortness of breath, wheezing and stridor.   Cardiovascular: Negative.  Negative for chest pain, palpitations, orthopnea, leg swelling and PND.  Gastrointestinal: Negative.  Negative for nausea, vomiting, abdominal pain, diarrhea, constipation and blood in stool.  Endocrine: Negative.   Genitourinary: Negative.  Negative for dysuria, urgency, hematuria and difficulty urinating.  Musculoskeletal: Negative.  Negative for arthralgias, back pain, myalgias and neck pain.  Skin: Negative.   Allergic/Immunologic: Negative.   Neurological: Positive for dizziness. Negative for headaches.  Hematological: Negative.  Negative for adenopathy. Does not bruise/bleed easily.  Psychiatric/Behavioral: Negative.        Objective:   Physical Exam  Vitals reviewed. Constitutional: She is oriented to person, place, and time. She  appears well-developed and well-nourished. No distress.  HENT:  Head: Normocephalic and atraumatic.  Mouth/Throat: Oropharynx is clear and moist. No oropharyngeal exudate.  Eyes: Conjunctivae are normal. Right eye exhibits no discharge. Left eye exhibits no discharge. No scleral icterus.  Neck: Normal range of motion. Neck supple. No JVD present. No tracheal deviation present. No thyromegaly present.  Cardiovascular: Normal rate, regular rhythm and intact distal pulses.  Exam reveals no gallop and no friction rub.   Murmur heard. Pulmonary/Chest: Effort normal and breath sounds normal. No stridor. No respiratory distress. She has no wheezes. She has no rales. She exhibits no tenderness.  Abdominal: Soft. Bowel sounds are normal. She exhibits no distension and no mass. There is no tenderness. There is no rebound and no guarding.  Musculoskeletal: Normal range of motion. She exhibits edema (2+ pitting edema in BLE). She exhibits no tenderness.  Lymphadenopathy:    She has no cervical adenopathy.  Neurological: She is oriented to person, place, and time.  Skin: Skin is warm and dry. No rash noted. She is not diaphoretic. No erythema. No pallor.     Lab Results  Component Value Date   WBC 9.2 06/14/2014   HGB 9.8* 06/14/2014   HCT 29.2* 06/14/2014   PLT 248 06/14/2014   GLUCOSE 101* 04/21/2014   CHOL 136 04/21/2014   TRIG 93.0 04/21/2014   HDL 62.90 04/21/2014   LDLCALC 55 04/21/2014   ALT 26 04/21/2014   AST 21 04/21/2014   NA 141 04/21/2014   K 4.9 04/21/2014   CL 108 04/21/2014   CREATININE 1.4* 04/21/2014   BUN 31* 04/21/2014   CO2 26 04/21/2014   TSH 0.76 04/21/2014   INR 1.06 10/19/2010   HGBA1C 5.1 12/02/2011       Assessment & Plan:

## 2014-06-29 NOTE — Assessment & Plan Note (Signed)
Her BP is well controlled 

## 2014-06-29 NOTE — Assessment & Plan Note (Addendum)
There are several causes for this (DD and aortic disease) but I am also concerned that there may be some venous valve incompetence so I have asked her to see vascular surgery. I will check her labs today to look for secondary/metabolic causes. I have asked her to increase the dose on the lasix as well stopping the nsaid

## 2014-06-30 ENCOUNTER — Telehealth: Payer: Self-pay | Admitting: Internal Medicine

## 2014-06-30 NOTE — Telephone Encounter (Signed)
Pt cancelled, states she is seeing her PCP, will c/b to r/s if needed.[name]..KJ

## 2014-07-08 ENCOUNTER — Other Ambulatory Visit: Payer: Self-pay | Admitting: *Deleted

## 2014-07-08 ENCOUNTER — Other Ambulatory Visit: Payer: Medicare Other

## 2014-07-08 ENCOUNTER — Ambulatory Visit: Payer: Medicare Other

## 2014-07-08 DIAGNOSIS — R609 Edema, unspecified: Secondary | ICD-10-CM

## 2014-07-11 ENCOUNTER — Ambulatory Visit: Payer: Medicare Other

## 2014-07-11 ENCOUNTER — Other Ambulatory Visit: Payer: Medicare Other

## 2014-07-20 ENCOUNTER — Encounter: Payer: Self-pay | Admitting: Gastroenterology

## 2014-07-27 ENCOUNTER — Telehealth: Payer: Self-pay | Admitting: Internal Medicine

## 2014-07-27 NOTE — Telephone Encounter (Signed)
She will need to wait a year for that

## 2014-07-27 NOTE — Telephone Encounter (Signed)
Called pt no answer LMOM with md response.../lmb 

## 2014-07-27 NOTE — Telephone Encounter (Signed)
Patient called stating her MyChart account is telling her to schedule an appointment for a pneumonia vaccine. She states she had one already this year and would like this updated on her account.

## 2014-07-27 NOTE — Telephone Encounter (Signed)
Pt had the prevnar pneumonia does she need to get the other one? That is the one that is popping up on her acct...Claudia Lara

## 2014-08-04 ENCOUNTER — Encounter (HOSPITAL_COMMUNITY): Payer: Self-pay

## 2014-08-04 ENCOUNTER — Encounter: Payer: Self-pay | Admitting: Vascular Surgery

## 2014-08-09 ENCOUNTER — Encounter: Payer: Self-pay | Admitting: Vascular Surgery

## 2014-08-10 ENCOUNTER — Encounter: Payer: Self-pay | Admitting: Vascular Surgery

## 2014-08-10 ENCOUNTER — Ambulatory Visit (HOSPITAL_COMMUNITY)
Admission: RE | Admit: 2014-08-10 | Discharge: 2014-08-10 | Disposition: A | Discharge status: Home / Self Care | Payer: Medicare Other | Source: Ambulatory Visit | Attending: Vascular Surgery | Admitting: Vascular Surgery

## 2014-08-10 ENCOUNTER — Ambulatory Visit (INDEPENDENT_AMBULATORY_CARE_PROVIDER_SITE_OTHER): Payer: Medicare Other | Admitting: Vascular Surgery

## 2014-08-10 VITALS — BP 155/60 | HR 63 | Ht 63.0 in | Wt 177.6 lb

## 2014-08-10 DIAGNOSIS — I83893 Varicose veins of bilateral lower extremities with other complications: Secondary | ICD-10-CM | POA: Insufficient documentation

## 2014-08-10 DIAGNOSIS — R609 Edema, unspecified: Secondary | ICD-10-CM

## 2014-08-10 DIAGNOSIS — I872 Venous insufficiency (chronic) (peripheral): Secondary | ICD-10-CM

## 2014-08-10 NOTE — Progress Notes (Signed)
Patient ID: Claudia Lara, female   DOB: 1932/04/21, 80 y.o.   MRN: [id number]  Reason for Consult: New Evaluation   Referred by Janith Lima, MD  Subjective:     HPI:  Claudia Lara is a 80 y.o. female states that she has had bilateral lower extremity swelling for years. He symptoms have been relatively stable over the last few years. She denies any previous history of DVT or phlebitis. She has had a right total replacement and also has issues with her left ankle which is been chronically swollen. She does not wear compression stockings.  I do not get any history of claudication, rest pain, or nonhealing ulcers.  Past Medical History  Diagnosis Date  . ANEMIA 08/22/2009  . Unspecified essential hypertension 10/19/2007  . CAROTID ARTERY STENOSIS 01/16/2010  . PVD 10/06/2007  . HEMORRHOIDS, INTERNAL     surgery was in the 90's (per patient)  . GEN OSTEOARTHROSIS INVOLVING MULTIPLE SITES 11/11/2008  . NECK PAIN, ACUTE 12/28/2009  . PERIPHERAL EDEMA 10/06/2007  . DYSPNEA ON EXERTION 11/16/2007   Family History  Problem Relation Age of Onset  . Ovarian cancer Mother   . Cancer Mother   . Leukemia Father   . Lung cancer Father   . Cancer Father   . Diabetes Neg Hx   . Heart disease Neg Hx   . Hypertension Neg Hx   . Cancer Brother    Past Surgical History  Procedure Laterality Date  . Hyperplastic colon polyps, removed  2007    By Dr. Penelope Coop  . Oophorectomy    . Rotator cuff repair  2004    left (Dr. Durward Fortes)  . Abdominal hysterectomy    . Joint replacement  2011    right knee  . Total knee arthroplasty Right     Short Social History:  History  Substance Use Topics  . Smoking status: Never Smoker   . Smokeless tobacco: Never Used  . Alcohol Use: No    Allergies  Allergen Reactions  . Sulfonamide Derivatives     Current Outpatient Prescriptions  Medication Sig Dispense Refill  . Ascorbic Acid (VITAMIN C) 500 MG tablet Take 500 mg by mouth daily.          . Calcium Carbonate-Vit D-Min (CALCIUM 600 + MINERALS) 600-200 MG-UNIT TABS Take by mouth daily.        . carvedilol (COREG) 12.5 MG tablet TAKE 1 TABLET BY MOUTH 2 TIMES DAILY WITH A MEAL.  360 tablet  2  . cholecalciferol (VITAMIN D) 1000 UNITS tablet Take 1,000 Units by mouth daily.      . furosemide (LASIX) 40 MG tablet Take 1 tablet (40 mg total) by mouth 2 (two) times daily.  60 tablet  5  . Glucosamine-Chondroit-Vit C-Mn (GLUCOSAMINE 1500 COMPLEX) CAPS Take by mouth daily.        [name] omeprazole (PRILOSEC) 40 MG capsule Take 40 mg by mouth daily.        . vitamin E 400 UNIT capsule Take 400 Units by mouth daily.       No current facility-administered medications for this visit.    Review of Systems  Constitutional: Negative for chills and fever.  Eyes: Negative for loss of vision.  Respiratory: Negative for cough and wheezing.  Cardiovascular: Positive for leg swelling. Negative for chest pain, chest tightness, claudication, dyspnea with exertion, orthopnea and palpitations.  GI: Negative for blood in stool and vomiting.  GU: Negative for dysuria and hematuria.  Musculoskeletal: Negative for leg pain, joint pain and myalgias.  Skin: Negative for rash and wound.  Neurological: Negative for dizziness and speech difficulty.  Hematologic: Negative for bruises/bleeds easily. Psychiatric: Negative for depressed mood.        Objective:  Objective  Filed Vitals:   08/10/14 1106  BP: 155/60  Pulse: 63  Height: 5\' 3"  (1.6 m)  Weight: 177 lb 9.6 oz (80.559 kg)  SpO2: 99%   Body mass index is 31.47 kg/(m^2).  Physical Exam  Cardiovascular:  Pulses:      Popliteal pulses are 2+ on the right side, and 2+ on the left side.       Dorsalis pedis pulses are 2+ on the right side, and 2+ on the left side.       Posterior tibial pulses are 0 on the right side, and 0 on the left side.  She has mild bilateral lower extremity swelling.  Skin:  She does have some hyperpigmentation  bilaterally consistent with chronic venous insufficiency.   Data: I have independently interpreted her venous duplex scan. On the right side, there is no evidence of DVT. There is no evidence of superficial thrombophlebitis. She does have deep vein reflux in the common femoral vein and superficial femoral vein. There is also some reflux in the right greater saphenous vein.  On the left side, there is no evidence of DVT or superficial thrombophlebitis. She does have some deep vein reflux in the common femoral vein, superficial femoral vein, and popliteal vein. There is also some reflux in her left thigh greater saphenous vein.      Assessment/Plan:    Chronic venous insufficiency Her physical exam and duplex findings suggest bilateral chronic venous insufficiency. We have discussed the importance of intermittent leg elevation and the proper positioning for this. I have also written her a prescription for knee-high compression stockings with a gradient of 15-20 mm of mercury. I have encouraged her to exercise as much as possible. I have encouraged her to avoid prolonged sitting and standing. I'll be happy to see her back at any time if any new vascular issues arise.     Angelia Mould MD [facility]

## 2014-08-10 NOTE — Assessment & Plan Note (Signed)
Her physical exam and duplex findings suggest bilateral chronic venous insufficiency. We have discussed the importance of intermittent leg elevation and the proper positioning for this. I have also written her a prescription for knee-high compression stockings with a gradient of 15-20 mm of mercury. I have encouraged her to exercise as much as possible. I have encouraged her to avoid prolonged sitting and standing. I'll be happy to see her back at any time if any new vascular issues arise.

## 2014-08-18 ENCOUNTER — Encounter (INDEPENDENT_AMBULATORY_CARE_PROVIDER_SITE_OTHER): Payer: Self-pay

## 2014-08-23 LAB — HM MAMMOGRAPHY: HM Mammogram: NORMAL

## 2014-09-14 ENCOUNTER — Encounter: Payer: Self-pay | Admitting: Internal Medicine

## 2014-09-14 ENCOUNTER — Ambulatory Visit (INDEPENDENT_AMBULATORY_CARE_PROVIDER_SITE_OTHER): Payer: Medicare Other | Admitting: Internal Medicine

## 2014-09-14 VITALS — BP 142/50 | HR 79 | Temp 97.5°F | Resp 16 | Ht 63.0 in | Wt 179.0 lb

## 2014-09-14 DIAGNOSIS — I519 Heart disease, unspecified: Secondary | ICD-10-CM

## 2014-09-14 DIAGNOSIS — I5189 Other ill-defined heart diseases: Secondary | ICD-10-CM

## 2014-09-14 DIAGNOSIS — I350 Nonrheumatic aortic (valve) stenosis: Secondary | ICD-10-CM

## 2014-09-14 DIAGNOSIS — I10 Essential (primary) hypertension: Secondary | ICD-10-CM

## 2014-09-14 MED ORDER — MELOXICAM 7.5 MG PO TABS: 7.5000 mg | ORAL_TABLET | Freq: Every day | ORAL | Status: DC

## 2014-09-14 NOTE — Assessment & Plan Note (Signed)
Her BP is well controlled 

## 2014-09-14 NOTE — Patient Instructions (Signed)

## 2014-09-14 NOTE — Assessment & Plan Note (Signed)
She has stable s/s and dose not want to f/up with cardiology at this time

## 2014-09-14 NOTE — Progress Notes (Signed)
Subjective:    Patient ID: Claudia Lara, female    DOB: 1932/11/22, 80 y.o.   MRN: [id number]  Hypertension This is a chronic problem. The current episode started more than 1 year ago. The problem is unchanged. The problem is controlled. Associated symptoms include malaise/fatigue and shortness of breath (mild, chronic, unchanged DOE). Pertinent negatives include no anxiety, blurred vision, chest pain, headaches, neck pain, orthopnea, palpitations, peripheral edema, PND or sweats. Agents associated with hypertension include NSAIDs. Past treatments include beta blockers and diuretics. Compliance problems include exercise and diet.  Hypertensive end-organ damage includes kidney disease, heart failure and left ventricular hypertrophy. Identifiable causes of hypertension include chronic renal disease.      Review of Systems  Constitutional: Positive for malaise/fatigue and fatigue. Negative for fever, chills, diaphoresis and appetite change.  HENT: Negative.   Eyes: Negative.  Negative for blurred vision.  Respiratory: Positive for shortness of breath (mild, chronic, unchanged DOE). Negative for apnea, cough, choking, chest tightness, wheezing and stridor.   Cardiovascular: Negative.  Negative for chest pain, palpitations, orthopnea, leg swelling and PND.  Gastrointestinal: Negative.  Negative for nausea, vomiting, abdominal pain, diarrhea, constipation and blood in stool.  Endocrine: Negative.   Genitourinary: Negative.  Negative for dysuria, urgency, hematuria, decreased urine volume and difficulty urinating.  Musculoskeletal: Positive for arthralgias. Negative for back pain, myalgias, neck pain and neck stiffness.  Skin: Negative.   Allergic/Immunologic: Negative.   Neurological: Negative.  Negative for dizziness, tremors, seizures, syncope, speech difficulty, weakness, light-headedness, numbness and headaches.  Hematological: Negative.  Does not bruise/bleed easily.    Psychiatric/Behavioral: Negative.        Objective:   Physical Exam  Vitals reviewed. Constitutional: She is oriented to person, place, and time. She appears well-developed and well-nourished. No distress.  HENT:  Head: Normocephalic and atraumatic.  Mouth/Throat: Oropharynx is clear and moist. No oropharyngeal exudate.  Eyes: Conjunctivae are normal. Right eye exhibits no discharge. Left eye exhibits no discharge. No scleral icterus.  Neck: Normal range of motion. Neck supple. No JVD present. No tracheal deviation present. No thyromegaly present.  Cardiovascular: Normal rate, regular rhythm, S1 normal and S2 normal.  Exam reveals no gallop, no S3, no S4 and no friction rub.   Murmur heard.  Systolic murmur is present with a grade of 1/6   No diastolic murmur is present  Pulses:      Carotid pulses are 1+ on the right side, and 1+ on the left side.      Radial pulses are 1+ on the right side, and 1+ on the left side.       Femoral pulses are 1+ on the right side, and 1+ on the left side.      Popliteal pulses are 1+ on the right side, and 1+ on the left side.       Dorsalis pedis pulses are 1+ on the right side, and 1+ on the left side.       Posterior tibial pulses are 1+ on the right side, and 1+ on the left side.  Pulmonary/Chest: Effort normal and breath sounds normal. No stridor. No respiratory distress. She has no wheezes. She has no rales. She exhibits no tenderness.  Abdominal: Soft. Bowel sounds are normal. She exhibits no distension and no mass. There is no tenderness. There is no rebound and no guarding.  Musculoskeletal: Normal range of motion. She exhibits edema (trace edema in BLE). She exhibits no tenderness.  Lymphadenopathy:    She has  no cervical adenopathy.  Neurological: She is oriented to person, place, and time.  Skin: Skin is warm and dry. No rash noted. She is not diaphoretic. No erythema. No pallor.  Psychiatric: She has a normal mood and affect. Her behavior  is normal. Judgment and thought content normal.          Assessment & Plan:

## 2014-09-14 NOTE — Assessment & Plan Note (Signed)
She has a normal volume status today

## 2014-09-14 NOTE — Progress Notes (Signed)
Pre visit review using our clinic review tool, if applicable. No additional management support is needed unless otherwise documented below in the visit note. 

## 2014-09-15 ENCOUNTER — Encounter: Payer: Self-pay | Admitting: Internal Medicine

## 2014-09-15 ENCOUNTER — Other Ambulatory Visit: Payer: Self-pay | Admitting: Internal Medicine

## 2014-09-15 DIAGNOSIS — I350 Nonrheumatic aortic (valve) stenosis: Secondary | ICD-10-CM

## 2014-09-23 ENCOUNTER — Encounter: Payer: Self-pay | Admitting: Cardiology

## 2014-09-23 ENCOUNTER — Ambulatory Visit (INDEPENDENT_AMBULATORY_CARE_PROVIDER_SITE_OTHER): Payer: Medicare Other | Admitting: Cardiology

## 2014-09-23 VITALS — BP 150/78 | HR 73 | Ht 63.5 in | Wt 178.8 lb

## 2014-09-23 DIAGNOSIS — R0609 Other forms of dyspnea: Secondary | ICD-10-CM | POA: Insufficient documentation

## 2014-09-23 DIAGNOSIS — I350 Nonrheumatic aortic (valve) stenosis: Secondary | ICD-10-CM

## 2014-09-23 DIAGNOSIS — R011 Cardiac murmur, unspecified: Secondary | ICD-10-CM

## 2014-09-23 DIAGNOSIS — I10 Essential (primary) hypertension: Secondary | ICD-10-CM

## 2014-09-23 DIAGNOSIS — I872 Venous insufficiency (chronic) (peripheral): Secondary | ICD-10-CM

## 2014-09-23 NOTE — Assessment & Plan Note (Signed)
The patient is not having any exertional chest pain to suggest angina.  She has not had any symptoms to suggest worsening CHF.  No history of exertional dizziness or syncope from her aortic stenosis.

## 2014-09-23 NOTE — Assessment & Plan Note (Signed)
Blood pressure today is mildly elevated here in the office.  She states that normally her blood pressure at home is in the normal range.

## 2014-09-23 NOTE — Assessment & Plan Note (Signed)
Her degree of dyspnea is variable.  She states that she is short of breath walking less than one city block.  On the other hand she is able to walk behind her lawnmower without any difficulty and she enjoys doing yard work.  She does have dyspnea climbing one flight of stairs.  She sleeps on one pillow.  She is not having any paroxysmal nocturnal dyspnea.

## 2014-09-23 NOTE — Progress Notes (Signed)
Claudia Lara Date of Birth:  [birth year] [facility] [address] [place], [place]  [address] [phone]        Fax   [phone]   History of Present Illness: This 80 year old divorced Caucasian female is seen for a one-year followup visit She is seen at the request of her internist Dr. [name]. She is being seen because of exertional dyspnea. She does not have any history of known ischemic heart disease. She had a normal nuclear stress test in 2007. In 2009 she had an echocardiogram showing normal left ventricular systolic function and mild mitral regurgitation and no significant aortic valve disease.  Her most recent echocardiogram was on 06/30/13 and showed normal left ventricular systolic function with an ejection fraction of 60-65% and there was grade 1 diastolic dysfunction.  She had evidence of mild aortic stenosis at that time with peak aortic gradient of 23 and a mean gradient of 14.  There was also mild to moderate mitral regurgitation.  The patient had a Myoview stress test on 07/02/13 showing normal left ventricular function and no evidence of ischemia. She does have a past history of high blood pressure. She is a nonsmoker. She is not diabetic. She denies any history of hypercholesterolemia.   Current Outpatient Prescriptions  Medication Sig Dispense Refill  . Ascorbic Acid (VITAMIN C) 500 MG tablet Take 500 mg by mouth daily.        . Calcium Carbonate-Vit D-Min (CALCIUM 600 + MINERALS) 600-200 MG-UNIT TABS Take by mouth daily.        . carvedilol (COREG) 12.5 MG tablet TAKE 1 TABLET BY MOUTH 2 TIMES DAILY WITH A MEAL.  360 tablet  2  . cholecalciferol (VITAMIN D) 1000 UNITS tablet Take 1,000 Units by mouth daily.      [name] FLUZONE HIGH-DOSE 0.5 ML SUSY       . furosemide (LASIX) 40 MG tablet Take 1 tablet (40 mg total) by mouth 2 (two) times daily.  60 tablet  5  . Glucosamine-Chondroit-Vit C-Mn (GLUCOSAMINE 1500 COMPLEX) CAPS Take by mouth daily.         . meloxicam (MOBIC) 7.5 MG tablet Take 1 tablet (7.5 mg total) by mouth daily.  90 tablet  3  . omeprazole (PRILOSEC) 40 MG capsule Take 40 mg by mouth daily.        . vitamin E 400 UNIT capsule Take 400 Units by mouth daily.       No current facility-administered medications for this visit.    Allergies  Allergen Reactions  . Sulfonamide Derivatives     Patient Active Problem List   Diagnosis Date Noted  . Chronic venous insufficiency 08/10/2014  . Aortic stenosis, mild 06/29/2014  . Left ventricular diastolic dysfunction, NYHA class 1 06/29/2014  . Edema 06/29/2014  . Senile osteopenia 04/21/2014  . Trigger finger, acquired 04/21/2014  . Hyperlipidemia LDL goal < 130 04/21/2014  . Kidney disease, chronic, stage III (GFR 30-59 ml/min) 04/21/2014  . Routine health maintenance 07/16/2012  . CAROTID ARTERY STENOSIS 01/16/2010  . ANEMIA 08/22/2009  . GEN OSTEOARTHROSIS INVOLVING MULTIPLE SITES 11/11/2008  . Essential hypertension 10/19/2007    History  Smoking status  . Never Smoker   Smokeless tobacco  . Never Used    History  Alcohol Use No    Family History  Problem Relation Age of Onset  . Ovarian cancer Mother   . Cancer Mother   . Leukemia Father   . Lung  cancer Father   . Cancer Father   . Diabetes Neg Hx   . Heart disease Neg Hx   . Hypertension Neg Hx   . Cancer Brother     Review of Systems: Constitutional: no fever chills diaphoresis or fatigue or change in weight.  Head and neck: no hearing loss, no epistaxis, no photophobia or visual disturbance. Respiratory: No cough, shortness of breath or wheezing. Cardiovascular: No chest pain peripheral edema, palpitations. Gastrointestinal: No abdominal distention, no abdominal pain, no change in bowel habits hematochezia or melena. Genitourinary: No dysuria, no frequency, no urgency, no nocturia. Musculoskeletal:No arthralgias, no back pain, no gait disturbance or myalgias. Neurological: No dizziness,  no headaches, no numbness, no seizures, no syncope, no weakness, no tremors. Hematologic: No lymphadenopathy, no easy bruising. Psychiatric: No confusion, no hallucinations, no sleep disturbance.    Physical Exam: Filed Vitals:   09/23/14 0913  BP: 150/78  Pulse: 73  The patient appears to be in no distress.  Head and neck exam reveals that the pupils are equal and reactive.  The extraocular movements are full.  There is no scleral icterus.  Mouth and pharynx are benign.  No lymphadenopathy.  No carotid bruits.  The carotid upstroke is normal. The jugular venous pressure is normal.  Thyroid is not enlarged or tender.  Chest is clear to percussion and auscultation.  No rales or rhonchi.  Expansion of the chest is symmetrical.  Heart reveals no abnormal lift or heave.  First and second heart sounds are normal.  There is grade 2/6 systolic ejection murmur at the base which radiates into the.  The abdomen is soft and nontender.  Bowel sounds are normoactive.  There is no hepatosplenomegaly or mass.  There are no abdominal bruits.  Extremities reveal mild edema worse in the left ankle than the right.  Pedal pulses are good.  There is no cyanosis or clubbing.  Neurologic exam is normal strength and no lateralizing weakness.  No sensory deficits.  Integument reveals no rash  EKG today shows normal sinus rhythm and is within normal limits.  Assessment / Plan: 1. aortic stenosis, mild last assessment. 2. dyspnea on exertion 3 essential hypertension  Disposition: Continue current medication.  Return for 2-D echo.  This will assess her diastolic function and her aortic valve.  Recheck in one year for followup office visit.

## 2014-09-23 NOTE — Assessment & Plan Note (Signed)
The patient has chronic ankle edema worse on the left foot secondary to chronic venous insufficiency as well as an old left ankle injury.  She does not take her Lasix on a regular basis but only when necessary

## 2014-09-23 NOTE — Patient Instructions (Addendum)
Your physician recommends that you continue on your current medications as directed. Please refer to the Current Medication list given to you today.  Your physician has requested that you have an echocardiogram. Echocardiography is a painless test that uses sound waves to create images of your heart. It provides your doctor with information about the size and shape of your heart and how well your heart's chambers and valves are working. This procedure takes approximately one hour. There are no restrictions for this procedure.  Your physician wants you to follow-up in: [place] will receive a reminder letter in the mail two months in advance. If you don't receive a letter, please call our office to schedule the follow-up appointment.

## 2014-09-29 ENCOUNTER — Ambulatory Visit (HOSPITAL_COMMUNITY): Payer: Medicare Other | Attending: Internal Medicine | Admitting: Radiology

## 2014-09-29 DIAGNOSIS — I359 Nonrheumatic aortic valve disorder, unspecified: Secondary | ICD-10-CM

## 2014-09-29 DIAGNOSIS — I350 Nonrheumatic aortic (valve) stenosis: Secondary | ICD-10-CM

## 2014-09-29 DIAGNOSIS — R0609 Other forms of dyspnea: Secondary | ICD-10-CM

## 2014-09-29 DIAGNOSIS — I10 Essential (primary) hypertension: Secondary | ICD-10-CM | POA: Diagnosis not present

## 2014-09-29 DIAGNOSIS — R011 Cardiac murmur, unspecified: Secondary | ICD-10-CM

## 2014-09-29 NOTE — Progress Notes (Signed)
Echocardiogram performed.  

## 2014-10-03 ENCOUNTER — Telehealth: Payer: Self-pay | Admitting: Cardiology

## 2014-10-03 NOTE — Telephone Encounter (Signed)
-----   Message from Darlin Coco, MD sent at 09/30/2014  3:39 PM EST ----- Please report.  The aortic stenosis is still very mild.  Good EF. Mild diastolic dysfunction.  Continue same meds.

## 2014-10-03 NOTE — Telephone Encounter (Signed)
New message ° ° ° ° ° °Pt want echo results °

## 2014-10-03 NOTE — Telephone Encounter (Signed)
Advised of echo results 

## 2014-10-31 ENCOUNTER — Other Ambulatory Visit: Payer: Self-pay | Admitting: *Deleted

## 2014-10-31 ENCOUNTER — Encounter: Payer: Self-pay | Admitting: Internal Medicine

## 2014-10-31 MED ORDER — MELOXICAM 15 MG PO TABS: 15.0000 mg | ORAL_TABLET | Freq: Every day | ORAL | Status: DC

## 2014-10-31 NOTE — Telephone Encounter (Signed)
Sent email needing new rx for  meloxicam 15mg  [facility]

## 2014-10-31 NOTE — Telephone Encounter (Signed)
Updated med list to meloxicam 15 mg take once a day. Sent to [facility]

## 2014-11-02 ENCOUNTER — Other Ambulatory Visit: Payer: Self-pay | Admitting: Internal Medicine

## 2014-11-08 ENCOUNTER — Other Ambulatory Visit: Payer: Self-pay | Admitting: *Deleted

## 2014-11-08 MED ORDER — CARVEDILOL 12.5 MG PO TABS: ORAL_TABLET | ORAL | Status: DC

## 2014-12-07 DIAGNOSIS — M47812 Spondylosis without myelopathy or radiculopathy, cervical region: Secondary | ICD-10-CM | POA: Diagnosis not present

## 2014-12-07 DIAGNOSIS — M5030 Other cervical disc degeneration, unspecified cervical region: Secondary | ICD-10-CM | POA: Diagnosis not present

## 2014-12-07 DIAGNOSIS — Z96651 Presence of right artificial knee joint: Secondary | ICD-10-CM | POA: Diagnosis not present

## 2014-12-07 DIAGNOSIS — M1711 Unilateral primary osteoarthritis, right knee: Secondary | ICD-10-CM | POA: Diagnosis not present

## 2014-12-26 ENCOUNTER — Other Ambulatory Visit: Payer: Self-pay | Admitting: Orthopaedic Surgery

## 2014-12-26 DIAGNOSIS — M542 Cervicalgia: Secondary | ICD-10-CM

## 2015-01-04 ENCOUNTER — Encounter: Payer: Self-pay | Admitting: Internal Medicine

## 2015-01-04 ENCOUNTER — Other Ambulatory Visit (INDEPENDENT_AMBULATORY_CARE_PROVIDER_SITE_OTHER): Payer: Medicare Other

## 2015-01-04 ENCOUNTER — Ambulatory Visit (INDEPENDENT_AMBULATORY_CARE_PROVIDER_SITE_OTHER): Payer: Medicare Other | Admitting: Internal Medicine

## 2015-01-04 VITALS — BP 160/64 | HR 70 | Temp 97.6°F | Resp 16 | Wt 178.0 lb

## 2015-01-04 DIAGNOSIS — N183 Chronic kidney disease, stage 3 unspecified: Secondary | ICD-10-CM

## 2015-01-04 DIAGNOSIS — D638 Anemia in other chronic diseases classified elsewhere: Secondary | ICD-10-CM | POA: Diagnosis not present

## 2015-01-04 DIAGNOSIS — I519 Heart disease, unspecified: Secondary | ICD-10-CM

## 2015-01-04 DIAGNOSIS — I10 Essential (primary) hypertension: Secondary | ICD-10-CM | POA: Diagnosis not present

## 2015-01-04 DIAGNOSIS — I5189 Other ill-defined heart diseases: Secondary | ICD-10-CM

## 2015-01-04 LAB — BASIC METABOLIC PANEL
BUN: 30 mg/dL — AB (ref 6–23)
CHLORIDE: 107 meq/L (ref 96–112)
CO2: 26 mEq/L (ref 19–32)
Calcium: 9.2 mg/dL (ref 8.4–10.5)
Creatinine, Ser: 1.12 mg/dL (ref 0.40–1.20)
GFR: 49.65 mL/min — ABNORMAL LOW (ref >60.00)
Glucose, Bld: 135 mg/dL — ABNORMAL HIGH (ref 70–99)
POTASSIUM: 4 meq/L (ref 3.5–5.1)
SODIUM: 139 meq/L (ref 135–145)

## 2015-01-04 LAB — CBC WITH DIFFERENTIAL/PLATELET
BASOS ABS: 0.1 10*3/uL (ref 0.0–0.1)
Basophils Relative: 0.8 % (ref 0.0–3.0)
EOS ABS: 0.3 10*3/uL (ref 0.0–0.7)
Eosinophils Relative: 3.6 % (ref 0.0–5.0)
HEMATOCRIT: 30 % — AB (ref 36.0–46.0)
HEMOGLOBIN: 10.3 g/dL — AB (ref 12.0–15.0)
LYMPHS ABS: 1.2 10*3/uL (ref 0.7–4.0)
Lymphocytes Relative: 17.4 % (ref 12.0–46.0)
MCHC: 34.4 g/dL (ref 30.0–36.0)
MCV: 88.5 fl (ref 78.0–100.0)
MONO ABS: 0.6 10*3/uL (ref 0.1–1.0)
Monocytes Relative: 8.7 % (ref 3.0–12.0)
NEUTROS ABS: 5 10*3/uL (ref 1.4–7.7)
Neutrophils Relative %: 69.5 % (ref 43.0–77.0)
PLATELETS: 242 10*3/uL (ref 150.0–400.0)
RBC: 3.39 Mil/uL — ABNORMAL LOW (ref 3.87–5.11)
RDW: 19 % — AB (ref 11.5–15.5)
WBC: 7.2 10*3/uL (ref 4.0–10.5)

## 2015-01-04 LAB — URINALYSIS, ROUTINE W REFLEX MICROSCOPIC
Bilirubin Urine: NEGATIVE
Hgb urine dipstick: NEGATIVE
KETONES UR: NEGATIVE
Nitrite: NEGATIVE
PH: 5.5 (ref 5.0–8.0)
RBC / HPF: NONE SEEN (ref 0–?)
Total Protein, Urine: NEGATIVE
URINE GLUCOSE: NEGATIVE
UROBILINOGEN UA: 0.2 (ref 0.0–1.0)

## 2015-01-04 MED ORDER — VALSARTAN-HYDROCHLOROTHIAZIDE 320-12.5 MG PO TABS: 1.0000 | ORAL_TABLET | Freq: Every day | ORAL | Status: DC

## 2015-01-04 NOTE — Progress Notes (Signed)
Pre visit review using our clinic review tool, if applicable. No additional management support is needed unless otherwise documented below in the visit note. 

## 2015-01-04 NOTE — Progress Notes (Signed)
   Subjective:    Patient ID: Claudia Lara, female    DOB: 1932-09-10, 81 y.o.   MRN: [id number]  Hypertension This is a chronic problem. The problem is unchanged. The problem is uncontrolled. Associated symptoms include malaise/fatigue and shortness of breath. Pertinent negatives include no anxiety, blurred vision, chest pain, headaches, orthopnea, palpitations, peripheral edema, PND or sweats. Agents associated with hypertension include NSAIDs. Risk factors for coronary artery disease include obesity. Past treatments include beta blockers and diuretics. The current treatment provides moderate improvement. Compliance problems include diet and exercise.  Hypertensive end-organ damage includes heart failure and left ventricular hypertrophy.      Review of Systems  Constitutional: Positive for malaise/fatigue and fatigue. Negative for fever, chills, diaphoresis and appetite change.  HENT: Negative.   Eyes: Negative.  Negative for blurred vision.  Respiratory: Positive for shortness of breath. Negative for apnea, cough, choking, chest tightness, wheezing and stridor.        She has chronic,unchanged SOB and DOE  Cardiovascular: Negative.  Negative for chest pain, palpitations, orthopnea, leg swelling and PND.  Gastrointestinal: Negative.  Negative for nausea, vomiting, abdominal pain, diarrhea, constipation and blood in stool.  Endocrine: Negative.   Genitourinary: Negative.  Negative for hematuria and flank pain.  Musculoskeletal: Positive for arthralgias. Negative for myalgias and back pain.  Skin: Negative.   Allergic/Immunologic: Negative.   Neurological: Negative.  Negative for dizziness, tremors, weakness, light-headedness and headaches.  Hematological: Negative.  Negative for adenopathy. Does not bruise/bleed easily.  Psychiatric/Behavioral: Negative.        Objective:   Physical Exam  Constitutional: She is oriented to person, place, and time. She appears well-developed and  well-nourished. No distress.  HENT:  Head: Normocephalic and atraumatic.  Mouth/Throat: Oropharynx is clear and moist. No oropharyngeal exudate.  Eyes: Conjunctivae are normal. Right eye exhibits no discharge. Left eye exhibits no discharge. No scleral icterus.  Neck: Normal range of motion. Neck supple. No JVD present. No tracheal deviation present. No thyromegaly present.  Cardiovascular: Normal rate, regular rhythm and intact distal pulses.  Exam reveals no gallop and no friction rub.   Murmur heard. Pulmonary/Chest: Effort normal and breath sounds normal. No stridor. No respiratory distress. She has no wheezes. She has no rales. She exhibits no tenderness.  Abdominal: Soft. Bowel sounds are normal. She exhibits no distension and no mass. There is no tenderness. There is no rebound and no guarding.  Musculoskeletal: Normal range of motion. She exhibits no edema or tenderness.  Lymphadenopathy:    She has no cervical adenopathy.  Neurological: She is oriented to person, place, and time.  Skin: Skin is warm and dry. No rash noted. She is not diaphoretic. No erythema. No pallor.  Vitals reviewed.     Lab Results  Component Value Date   WBC 6.6 06/29/2014   HGB 10.0* 06/29/2014   HCT 28.8* 06/29/2014   PLT 234.0 06/29/2014   GLUCOSE 118* 06/29/2014   CHOL 136 04/21/2014   TRIG 93.0 04/21/2014   HDL 62.90 04/21/2014   LDLCALC 55 04/21/2014   ALT 15 06/29/2014   AST 25 06/29/2014   NA 140 06/29/2014   K 4.3 06/29/2014   CL 107 06/29/2014   CREATININE 1.3* 06/29/2014   BUN 27* 06/29/2014   CO2 26 06/29/2014   TSH 0.76 04/21/2014   INR 1.06 10/19/2010   HGBA1C 5.1 12/02/2011      Assessment & Plan:

## 2015-01-04 NOTE — Patient Instructions (Signed)

## 2015-01-05 NOTE — Assessment & Plan Note (Signed)
Her BP is not well controlled, she does not like to take a diuretic BID so she has not been taking the lasix as prescribed Will cont coreg but will try to get better BP controlled with an ARB/HCTZ combination

## 2015-01-05 NOTE — Assessment & Plan Note (Signed)
Her H and H have improved some Will cont to follow

## 2015-01-05 NOTE — Assessment & Plan Note (Signed)
Her renal function is stable Will try to get better BP control

## 2015-01-06 ENCOUNTER — Ambulatory Visit
Admission: RE | Admit: 2015-01-06 | Discharge: 2015-01-06 | Disposition: A | Discharge status: Home / Self Care | Payer: Medicare Other | Source: Ambulatory Visit | Attending: Orthopaedic Surgery | Admitting: Orthopaedic Surgery

## 2015-01-06 DIAGNOSIS — M47812 Spondylosis without myelopathy or radiculopathy, cervical region: Secondary | ICD-10-CM | POA: Diagnosis not present

## 2015-01-06 DIAGNOSIS — M542 Cervicalgia: Secondary | ICD-10-CM

## 2015-01-06 DIAGNOSIS — M4802 Spinal stenosis, cervical region: Secondary | ICD-10-CM | POA: Diagnosis not present

## 2015-01-06 DIAGNOSIS — M5032 Other cervical disc degeneration, mid-cervical region: Secondary | ICD-10-CM | POA: Diagnosis not present

## 2015-01-10 DIAGNOSIS — M9981 Other biomechanical lesions of cervical region: Secondary | ICD-10-CM | POA: Diagnosis not present

## 2015-01-10 DIAGNOSIS — M4802 Spinal stenosis, cervical region: Secondary | ICD-10-CM | POA: Diagnosis not present

## 2015-01-12 ENCOUNTER — Encounter: Payer: Self-pay | Admitting: Internal Medicine

## 2015-01-30 ENCOUNTER — Other Ambulatory Visit: Payer: Self-pay | Admitting: Internal Medicine

## 2015-01-30 ENCOUNTER — Encounter: Payer: Self-pay | Admitting: Internal Medicine

## 2015-01-30 DIAGNOSIS — I519 Heart disease, unspecified: Secondary | ICD-10-CM

## 2015-01-30 DIAGNOSIS — I10 Essential (primary) hypertension: Secondary | ICD-10-CM

## 2015-01-30 DIAGNOSIS — I5189 Other ill-defined heart diseases: Secondary | ICD-10-CM

## 2015-01-30 MED ORDER — VALSARTAN 320 MG PO TABS: 320.0000 mg | ORAL_TABLET | Freq: Every day | ORAL | Status: DC

## 2015-01-30 MED ORDER — TORSEMIDE 10 MG PO TABS: 10.0000 mg | ORAL_TABLET | Freq: Every day | ORAL | Status: DC

## 2015-02-01 ENCOUNTER — Encounter: Payer: Self-pay | Admitting: Internal Medicine

## 2015-02-02 DIAGNOSIS — M4802 Spinal stenosis, cervical region: Secondary | ICD-10-CM | POA: Diagnosis not present

## 2015-02-02 DIAGNOSIS — M4721 Other spondylosis with radiculopathy, occipito-atlanto-axial region: Secondary | ICD-10-CM | POA: Diagnosis not present

## 2015-02-02 DIAGNOSIS — M5412 Radiculopathy, cervical region: Secondary | ICD-10-CM | POA: Diagnosis not present

## 2015-02-02 DIAGNOSIS — M5010 Cervical disc disorder with radiculopathy, unspecified cervical region: Secondary | ICD-10-CM | POA: Diagnosis not present

## 2015-02-13 DIAGNOSIS — M4802 Spinal stenosis, cervical region: Secondary | ICD-10-CM | POA: Diagnosis not present

## 2015-02-13 DIAGNOSIS — M4721 Other spondylosis with radiculopathy, occipito-atlanto-axial region: Secondary | ICD-10-CM | POA: Diagnosis not present

## 2015-02-13 DIAGNOSIS — M5010 Cervical disc disorder with radiculopathy, unspecified cervical region: Secondary | ICD-10-CM | POA: Diagnosis not present

## 2015-02-20 ENCOUNTER — Encounter: Payer: Self-pay | Admitting: Internal Medicine

## 2015-03-14 DIAGNOSIS — B070 Plantar wart: Secondary | ICD-10-CM | POA: Diagnosis not present

## 2015-03-14 DIAGNOSIS — L820 Inflamed seborrheic keratosis: Secondary | ICD-10-CM | POA: Diagnosis not present

## 2015-04-27 ENCOUNTER — Other Ambulatory Visit: Payer: Self-pay | Admitting: Internal Medicine

## 2015-04-27 DIAGNOSIS — I6523 Occlusion and stenosis of bilateral carotid arteries: Secondary | ICD-10-CM

## 2015-05-02 ENCOUNTER — Ambulatory Visit (HOSPITAL_COMMUNITY): Payer: Medicare Other | Attending: Internal Medicine

## 2015-05-02 DIAGNOSIS — I10 Essential (primary) hypertension: Secondary | ICD-10-CM | POA: Diagnosis not present

## 2015-05-02 DIAGNOSIS — I6523 Occlusion and stenosis of bilateral carotid arteries: Secondary | ICD-10-CM | POA: Insufficient documentation

## 2015-05-02 DIAGNOSIS — E785 Hyperlipidemia, unspecified: Secondary | ICD-10-CM | POA: Insufficient documentation

## 2015-05-15 ENCOUNTER — Ambulatory Visit (INDEPENDENT_AMBULATORY_CARE_PROVIDER_SITE_OTHER): Payer: Medicare Other | Admitting: Internal Medicine

## 2015-05-15 ENCOUNTER — Other Ambulatory Visit (INDEPENDENT_AMBULATORY_CARE_PROVIDER_SITE_OTHER): Payer: Medicare Other

## 2015-05-15 ENCOUNTER — Encounter: Payer: Self-pay | Admitting: Internal Medicine

## 2015-05-15 VITALS — BP 138/62 | HR 80 | Temp 98.4°F | Resp 16 | Ht 63.5 in | Wt 175.0 lb

## 2015-05-15 DIAGNOSIS — Z0000 Encounter for general adult medical examination without abnormal findings: Secondary | ICD-10-CM

## 2015-05-15 DIAGNOSIS — N183 Chronic kidney disease, stage 3 unspecified: Secondary | ICD-10-CM

## 2015-05-15 DIAGNOSIS — E785 Hyperlipidemia, unspecified: Secondary | ICD-10-CM

## 2015-05-15 DIAGNOSIS — Z23 Encounter for immunization: Secondary | ICD-10-CM | POA: Diagnosis not present

## 2015-05-15 DIAGNOSIS — I10 Essential (primary) hypertension: Secondary | ICD-10-CM

## 2015-05-15 DIAGNOSIS — R300 Dysuria: Secondary | ICD-10-CM

## 2015-05-15 LAB — COMPREHENSIVE METABOLIC PANEL
ALT: 12 U/L (ref 0–35)
AST: 16 U/L (ref 0–37)
Albumin: 4.3 g/dL (ref 3.5–5.2)
Alkaline Phosphatase: 76 U/L (ref 39–117)
BUN: 30 mg/dL — ABNORMAL HIGH (ref 6–23)
CO2: 25 mEq/L (ref 19–32)
Calcium: 9.3 mg/dL (ref 8.4–10.5)
Chloride: 107 mEq/L (ref 96–112)
Creatinine, Ser: 1.29 mg/dL — ABNORMAL HIGH (ref 0.40–1.20)
GFR: 42.14 mL/min — ABNORMAL LOW (ref >60.00)
Glucose, Bld: 118 mg/dL — ABNORMAL HIGH (ref 70–99)
Potassium: 4.4 mEq/L (ref 3.5–5.1)
Sodium: 138 mEq/L (ref 135–145)
Total Bilirubin: 1.6 mg/dL — ABNORMAL HIGH (ref 0.2–1.2)
Total Protein: 6.3 g/dL (ref 6.0–8.3)

## 2015-05-15 LAB — URINALYSIS, ROUTINE W REFLEX MICROSCOPIC
BILIRUBIN URINE: NEGATIVE
KETONES UR: NEGATIVE
Nitrite: POSITIVE — AB
Specific Gravity, Urine: 1.025 (ref 1.000–1.030)
UROBILINOGEN UA: 0.2 (ref 0.0–1.0)
Urine Glucose: NEGATIVE
pH: 6 (ref 5.0–8.0)

## 2015-05-15 LAB — LIPID PANEL
Cholesterol: 136 mg/dL (ref 0–200)
HDL: 48.3 mg/dL (ref >39.00)
LDL CALC: 49 mg/dL (ref 0–99)
NONHDL: 87.7
Total CHOL/HDL Ratio: 3
Triglycerides: 195 mg/dL — ABNORMAL HIGH (ref 0.0–149.0)
VLDL: 39 mg/dL (ref 0.0–40.0)

## 2015-05-15 LAB — CBC WITH DIFFERENTIAL/PLATELET
Basophils Absolute: 0 10*3/uL (ref 0.0–0.1)
Basophils Relative: 0.3 % (ref 0.0–3.0)
EOS ABS: 0.5 10*3/uL (ref 0.0–0.7)
EOS PCT: 5.2 % — AB (ref 0.0–5.0)
HEMATOCRIT: 28.4 % — AB (ref 36.0–46.0)
Hemoglobin: 9.8 g/dL — ABNORMAL LOW (ref 12.0–15.0)
LYMPHS ABS: 1.2 10*3/uL (ref 0.7–4.0)
Lymphocytes Relative: 14 % (ref 12.0–46.0)
MCHC: 34.6 g/dL (ref 30.0–36.0)
MCV: 89.7 fl (ref 78.0–100.0)
Monocytes Absolute: 0.6 10*3/uL (ref 0.1–1.0)
Monocytes Relative: 6.8 % (ref 3.0–12.0)
Neutro Abs: 6.4 10*3/uL (ref 1.4–7.7)
Neutrophils Relative %: 73.7 % (ref 43.0–77.0)
Platelets: 218 10*3/uL (ref 150.0–400.0)
RBC: 3.17 Mil/uL — ABNORMAL LOW (ref 3.87–5.11)
RDW: 18.6 % — ABNORMAL HIGH (ref 11.5–15.5)
WBC: 8.6 10*3/uL (ref 4.0–10.5)

## 2015-05-15 LAB — TSH: TSH: 0.88 u[IU]/mL (ref 0.35–4.50)

## 2015-05-15 MED ORDER — CIPROFLOXACIN HCL 250 MG PO TABS: 250.0000 mg | ORAL_TABLET | Freq: Two times a day (BID) | ORAL | Status: AC

## 2015-05-15 NOTE — Progress Notes (Signed)
Subjective:  Patient ID: Claudia Lara, female    DOB: [birth year]  Age: 81 y.o. MRN: [id number]  CC: Annual Exam and Hypertension   HPI Claudia Lara presents for a CPX and BP check. She complains of dysuria today.  Outpatient Prescriptions Prior to Visit  Medication Sig Dispense Refill  . Ascorbic Acid (VITAMIN C) 500 MG tablet Take 500 mg by mouth daily.      . Calcium Carbonate-Vit D-Min (CALCIUM 600 + MINERALS) 600-200 MG-UNIT TABS Take by mouth daily.      . carvedilol (COREG) 12.5 MG tablet TAKE 1 TABLET BY MOUTH 2 TIMES DAILY WITH A MEAL. 360 tablet 1  . cholecalciferol (VITAMIN D) 1000 UNITS tablet Take 1,000 Units by mouth daily.    . Glucosamine-Chondroit-Vit C-Mn (GLUCOSAMINE 1500 COMPLEX) CAPS Take by mouth daily.      . meloxicam (MOBIC) 15 MG tablet Take 1 tablet (15 mg total) by mouth daily. 90 tablet 2  . omeprazole (PRILOSEC) 40 MG capsule Take 40 mg by mouth daily.      [name] torsemide (DEMADEX) 10 MG tablet Take 1 tablet (10 mg total) by mouth daily. 90 tablet 1  . valsartan (DIOVAN) 320 MG tablet Take 1 tablet (320 mg total) by mouth daily. 90 tablet 3  . vitamin E 400 UNIT capsule Take 400 Units by mouth daily.     No facility-administered medications prior to visit.    ROS Review of Systems  Constitutional: Negative.  Negative for fever, chills, diaphoresis, appetite change and fatigue.  HENT: Negative.   Eyes: Negative.   Respiratory: Negative.  Negative for cough, choking, chest tightness, shortness of breath and stridor.   Cardiovascular: Negative.  Negative for chest pain, palpitations and leg swelling.  Gastrointestinal: Negative.  Negative for nausea, vomiting, abdominal pain, diarrhea, constipation and blood in stool.  Endocrine: Negative.   Genitourinary: Positive for dysuria. Negative for urgency, frequency, hematuria, flank pain, decreased urine volume, enuresis and difficulty urinating.  Musculoskeletal: Negative.  Negative for myalgias, back pain,  joint swelling and arthralgias.  Skin: Negative.  Negative for rash.  Allergic/Immunologic: Negative.   Neurological: Negative.   Hematological: Negative.  Negative for adenopathy. Does not bruise/bleed easily.  Psychiatric/Behavioral: Negative.     Objective:  BP 138/62 mmHg  Pulse 80  Temp(Src) 98.4 F (36.9 C) (Oral)  Resp 16  Ht 5' 3.5" (1.613 m)  Wt 175 lb (79.379 kg)  BMI 30.51 kg/m2  SpO2 97%  BP Readings from Last 3 Encounters:  05/15/15 138/62  01/04/15 160/64  09/23/14 150/78    Wt Readings from Last 3 Encounters:  05/15/15 175 lb (79.379 kg)  01/04/15 178 lb (80.74 kg)  09/23/14 178 lb 12.8 oz (81.103 kg)    Physical Exam  Constitutional: She is oriented to person, place, and time. She appears well-developed and well-nourished. No distress.  HENT:  Head: Normocephalic and atraumatic.  Mouth/Throat: Oropharynx is clear and moist. No oropharyngeal exudate.  Eyes: Conjunctivae are normal. Right eye exhibits no discharge. Left eye exhibits no discharge. No scleral icterus.  Neck: Normal range of motion. Neck supple. No JVD present. No tracheal deviation present. No thyromegaly present.  Cardiovascular: Normal rate, regular rhythm and intact distal pulses.  Exam reveals no gallop.   Murmur heard.  Decrescendo systolic murmur is present with a grade of 1/6  Pulmonary/Chest: Effort normal and breath sounds normal. No stridor. No respiratory distress. She has no wheezes. She has no rales. She exhibits no tenderness.  Abdominal:  Soft. Bowel sounds are normal. She exhibits no distension and no mass. There is no tenderness. There is no rebound and no guarding. Hernia confirmed negative in the right inguinal area and confirmed negative in the left inguinal area.  Genitourinary: Rectum normal. Rectal exam shows no external hemorrhoid, no internal hemorrhoid, no fissure, no mass, no tenderness and anal tone normal. Guaiac negative stool. No breast swelling, tenderness,  discharge or bleeding. Pelvic exam was performed with patient supine. No labial fusion. There is no rash, tenderness, lesion or injury on the right labia. There is no tenderness, lesion or injury on the left labia. Right adnexum displays no mass, no tenderness and no fullness. Left adnexum displays no mass, no tenderness and no fullness. No erythema, tenderness or bleeding in the vagina. No foreign body around the vagina. No signs of injury around the vagina. No vaginal discharge found.  Musculoskeletal: Normal range of motion. She exhibits no edema or tenderness.  Lymphadenopathy:    She has no cervical adenopathy.       Right: No inguinal adenopathy present.       Left: No inguinal adenopathy present.  Neurological: She is oriented to person, place, and time.  Skin: Skin is warm and dry. No rash noted. She is not diaphoretic. No erythema. No pallor.  Psychiatric: She has a normal mood and affect. Her behavior is normal. Judgment and thought content normal.  Vitals reviewed.   Lab Results  Component Value Date   WBC 8.6 05/15/2015   HGB 9.8* 05/15/2015   HCT 28.4* 05/15/2015   PLT 218.0 05/15/2015   GLUCOSE 118* 05/15/2015   CHOL 136 05/15/2015   TRIG 195.0* 05/15/2015   HDL 48.30 05/15/2015   LDLCALC 49 05/15/2015   ALT 12 05/15/2015   AST 16 05/15/2015   NA 138 05/15/2015   K 4.4 05/15/2015   CL 107 05/15/2015   CREATININE 1.29* 05/15/2015   BUN 30* 05/15/2015   CO2 25 05/15/2015   TSH 0.88 05/15/2015   INR 1.06 10/19/2010   HGBA1C 5.1 12/02/2011    Mr Cervical Spine Wo Contrast  01/06/2015   CLINICAL DATA:  Pain at the base of the neck for 4-6 weeks. Numbness in the right arm and hand.  EXAM: MRI CERVICAL SPINE WITHOUT CONTRAST  TECHNIQUE: Multiplanar, multisequence MR imaging of the cervical spine was performed. No intravenous contrast was administered.  COMPARISON:  Cervical spine radiographs 12/28/2009  FINDINGS: There is grade 1 anterolisthesis of C3 on C4, C4 on C5, and  C5 on C6, greatest at C4-5 where it measures 4 mm. There is diffuse disc space narrowing throughout the cervical spine, moderate to severe at C5-6 and C6-7. Diffuse degenerative endplate changes are present, including minimal endplate edema at J6-9 and C6-7. Prominent anterior osteophytosis is present in the lower cervical spine. A punctate focus of T2 hyperintensity in the left cerebellum is nonspecific but likely reflects chronic small vessel ischemia. Cervical spinal cord is normal in caliber and signal. Paraspinal soft tissues are unremarkable.  C2-3: Broad-based posterior disc osteophyte complex and moderate left greater than right facet arthrosis result in minimal right and mild-to-moderate left neural foraminal stenosis without spinal stenosis.  C3-4: Broad-based posterior disc osteophyte complex, infolding of the ligamentum flavum, and severe bilateral facet arthrosis result in mild spinal stenosis and severe right and moderate left neural foraminal stenosis.  C4-5: Listhesis with disc uncovering, uncovertebral spurring, and severe left facet arthrosis result in mild spinal stenosis and mild right and moderate to severe  left neural foraminal stenosis.  C5-6: Listhesis with disc uncovering, uncovertebral spurring, and moderate left facet arthrosis result in moderate spinal stenosis and severe right and mild-to-moderate left neural foraminal stenosis.  C6-7: Disc bulging, uncovertebral spurring, infolding of the ligamentum flavum, and mild right facet arthrosis result in moderate spinal stenosis and mild right greater than left neural foraminal stenosis.  C7-T1: Broad-based posterior disc osteophyte complex results in mild spinal stenosis and mild bilateral neural foraminal stenosis.  T1-2: Disc bulging results in mild to moderate bilateral neural foraminal stenosis without spinal stenosis.  IMPRESSION: Severe, diffuse disc degeneration and facet arthrosis in the cervical spine, worst at C5-6 where there is  moderate spinal stenosis and severe right neural foraminal stenosis.   Electronically Signed   By: Logan Bores   On: 01/06/2015 10:40    Assessment & Plan:   Onell was seen today for annual exam and hypertension.  Diagnoses and all orders for this visit:  Kidney disease, chronic, stage III (GFR 30-59 ml/min) Orders: -     CBC with Differential/Platelet; Future  Essential hypertension - her BP is well controlled, lytes and renal function are stable  Hyperlipidemia with target LDL less than 130 - she has achieved her LDL goal Orders: -     Lipid panel; Future -     Comprehensive metabolic panel; Future -     TSH; Future  Dysuria - will treat for UTI with cipro Orders: -     Urinalysis, Routine w reflex microscopic (not at [facility]); Future -     CULTURE, URINE COMPREHENSIVE; Future   I am having Ms. Younge start on ciprofloxacin. I am also having her maintain her CALCIUM 600 + MINERALS, GLUCOSAMINE 1500 COMPLEX, omeprazole, vitamin C, cholecalciferol, vitamin E, meloxicam, carvedilol, valsartan, and torsemide.  Meds ordered this encounter  Medications  . ciprofloxacin (CIPRO) 250 MG tablet    Sig: Take 1 tablet (250 mg total) by mouth 2 (two) times daily.    Dispense:  10 tablet    Refill:  1   See AVS for instructions about healthy living and anticipatory guidance.  Follow-up: Return in about 6 months (around 11/14/2015).  [name], MD

## 2015-05-15 NOTE — Patient Instructions (Signed)
Preventive Care for Adults A healthy lifestyle and preventive care can promote health and wellness. Preventive health guidelines for women include the following key practices.  A routine yearly physical is a good way to check with your health care provider about your health and preventive screening. It is a chance to share any concerns and updates on your health and to receive a thorough exam.  Visit your dentist for a routine exam and preventive care every 6 months. Brush your teeth twice a day and floss once a day. Good oral hygiene prevents tooth decay and gum disease.  The frequency of eye exams is based on your age, health, family medical history, use of contact lenses, and other factors. Follow your health care provider's recommendations for frequency of eye exams.  Eat a healthy diet. Foods like vegetables, fruits, whole grains, low-fat dairy products, and lean protein foods contain the nutrients you need without too many calories. Decrease your intake of foods high in solid fats, added sugars, and salt. Eat the right amount of calories for you.Get information about a proper diet from your health care provider, if necessary.  Regular physical exercise is one of the most important things you can do for your health. Most adults should get at least 150 minutes of moderate-intensity exercise (any activity that increases your heart rate and causes you to sweat) each week. In addition, most adults need muscle-strengthening exercises on 2 or more days a week.  Maintain a healthy weight. The body mass index (BMI) is a screening tool to identify possible weight problems. It provides an estimate of body fat based on height and weight. Your health care provider can find your BMI and can help you achieve or maintain a healthy weight.For adults 20 years and older:  A BMI below 18.5 is considered underweight.  A BMI of 18.5 to 24.9 is normal.  A BMI of 25 to 29.9 is considered overweight.  A BMI of  30 and above is considered obese.  Maintain normal blood lipids and cholesterol levels by exercising and minimizing your intake of saturated fat. Eat a balanced diet with plenty of fruit and vegetables. Blood tests for lipids and cholesterol should begin at age 76 and be repeated every 5 years. If your lipid or cholesterol levels are high, you are over 50, or you are at high risk for heart disease, you may need your cholesterol levels checked more frequently.Ongoing high lipid and cholesterol levels should be treated with medicines if diet and exercise are not working.  If you smoke, find out from your health care provider how to quit. If you do not use tobacco, do not start.  Lung cancer screening is recommended for adults aged 22-80 years who are at high risk for developing lung cancer because of a history of smoking. A yearly low-dose CT scan of the lungs is recommended for people who have at least a 30-pack-year history of smoking and are a current smoker or have quit within the past 15 years. A pack year of smoking is smoking an average of 1 pack of cigarettes a day for 1 year (for example: 1 pack a day for 30 years or 2 packs a day for 15 years). Yearly screening should continue until the smoker has stopped smoking for at least 15 years. Yearly screening should be stopped for people who develop a health problem that would prevent them from having lung cancer treatment.  If you are pregnant, do not drink alcohol. If you are breastfeeding,  be very cautious about drinking alcohol. If you are not pregnant and choose to drink alcohol, do not have more than 1 drink per day. One drink is considered to be 12 ounces (355 mL) of beer, 5 ounces (148 mL) of wine, or 1.5 ounces (44 mL) of liquor.  Avoid use of street drugs. Do not share needles with anyone. Ask for help if you need support or instructions about stopping the use of drugs.  High blood pressure causes heart disease and increases the risk of  stroke. Your blood pressure should be checked at least every 1 to 2 years. Ongoing high blood pressure should be treated with medicines if weight loss and exercise do not work.  If you are 75-52 years old, ask your health care provider if you should take aspirin to prevent strokes.  Diabetes screening involves taking a blood sample to check your fasting blood sugar level. This should be done once every 3 years, after age 15, if you are within normal weight and without risk factors for diabetes. Testing should be considered at a younger age or be carried out more frequently if you are overweight and have at least 1 risk factor for diabetes.  Breast cancer screening is essential preventive care for women. You should practice "breast self-awareness." This means understanding the normal appearance and feel of your breasts and may include breast self-examination. Any changes detected, no matter how small, should be reported to a health care provider. Women in their 58s and 30s should have a clinical breast exam (CBE) by a health care provider as part of a regular health exam every 1 to 3 years. After age 16, women should have a CBE every year. Starting at age 53, women should consider having a mammogram (breast X-ray test) every year. Women who have a family history of breast cancer should talk to their health care provider about genetic screening. Women at a high risk of breast cancer should talk to their health care providers about having an MRI and a mammogram every year.  Breast cancer gene (BRCA)-related cancer risk assessment is recommended for women who have family members with BRCA-related cancers. BRCA-related cancers include breast, ovarian, tubal, and peritoneal cancers. Having family members with these cancers may be associated with an increased risk for harmful changes (mutations) in the breast cancer genes BRCA1 and BRCA2. Results of the assessment will determine the need for genetic counseling and  BRCA1 and BRCA2 testing.  Routine pelvic exams to screen for cancer are no longer recommended for nonpregnant women who are considered low risk for cancer of the pelvic organs (ovaries, uterus, and vagina) and who do not have symptoms. Ask your health care provider if a screening pelvic exam is right for you.  If you have had past treatment for cervical cancer or a condition that could lead to cancer, you need Pap tests and screening for cancer for at least 20 years after your treatment. If Pap tests have been discontinued, your risk factors (such as having a new sexual partner) need to be reassessed to determine if screening should be resumed. Some women have medical problems that increase the chance of getting cervical cancer. In these cases, your health care provider may recommend more frequent screening and Pap tests.  The HPV test is an additional test that may be used for cervical cancer screening. The HPV test looks for the virus that can cause the cell changes on the cervix. The cells collected during the Pap test can be  tested for HPV. The HPV test could be used to screen women aged 30 years and older, and should be used in women of any age who have unclear Pap test results. After the age of 30, women should have HPV testing at the same frequency as a Pap test.  Colorectal cancer can be detected and often prevented. Most routine colorectal cancer screening begins at the age of 50 years and continues through age 75 years. However, your health care provider may recommend screening at an earlier age if you have risk factors for colon cancer. On a yearly basis, your health care provider may provide home test kits to check for hidden blood in the stool. Use of a small camera at the end of a tube, to directly examine the colon (sigmoidoscopy or colonoscopy), can detect the earliest forms of colorectal cancer. Talk to your health care provider about this at age 50, when routine screening begins. Direct  exam of the colon should be repeated every 5-10 years through age 75 years, unless early forms of pre-cancerous polyps or small growths are found.  People who are at an increased risk for hepatitis B should be screened for this virus. You are considered at high risk for hepatitis B if:  You were born in a country where hepatitis B occurs often. Talk with your health care provider about which countries are considered high risk.  Your parents were born in a high-risk country and you have not received a shot to protect against hepatitis B (hepatitis B vaccine).  You have HIV or AIDS.  You use needles to inject street drugs.  You live with, or have sex with, someone who has hepatitis B.  You get hemodialysis treatment.  You take certain medicines for conditions like cancer, organ transplantation, and autoimmune conditions.  Hepatitis C blood testing is recommended for all people born from 1945 through 1965 and any individual with known risks for hepatitis C.  Practice safe sex. Use condoms and avoid high-risk sexual practices to reduce the spread of sexually transmitted infections (STIs). STIs include gonorrhea, chlamydia, syphilis, trichomonas, herpes, HPV, and human immunodeficiency virus (HIV). Herpes, HIV, and HPV are viral illnesses that have no cure. They can result in disability, cancer, and death.  You should be screened for sexually transmitted illnesses (STIs) including gonorrhea and chlamydia if:  You are sexually active and are younger than 24 years.  You are older than 24 years and your health care provider tells you that you are at risk for this type of infection.  Your sexual activity has changed since you were last screened and you are at an increased risk for chlamydia or gonorrhea. Ask your health care provider if you are at risk.  If you are at risk of being infected with HIV, it is recommended that you take a prescription medicine daily to prevent HIV infection. This is  called preexposure prophylaxis (PrEP). You are considered at risk if:  You are a heterosexual woman, are sexually active, and are at increased risk for HIV infection.  You take drugs by injection.  You are sexually active with a partner who has HIV.  Talk with your health care provider about whether you are at high risk of being infected with HIV. If you choose to begin PrEP, you should first be tested for HIV. You should then be tested every 3 months for as long as you are taking PrEP.  Osteoporosis is a disease in which the bones lose minerals and strength   with aging. This can result in serious bone fractures or breaks. The risk of osteoporosis can be identified using a bone density scan. Women ages 65 years and over and women at risk for fractures or osteoporosis should discuss screening with their health care providers. Ask your health care provider whether you should take a calcium supplement or vitamin D to reduce the rate of osteoporosis.  Menopause can be associated with physical symptoms and risks. Hormone replacement therapy is available to decrease symptoms and risks. You should talk to your health care provider about whether hormone replacement therapy is right for you.  Use sunscreen. Apply sunscreen liberally and repeatedly throughout the day. You should seek shade when your shadow is shorter than you. Protect yourself by wearing long sleeves, pants, a wide-brimmed hat, and sunglasses year round, whenever you are outdoors.  Once a month, do a whole body skin exam, using a mirror to look at the skin on your back. Tell your health care provider of new moles, moles that have irregular borders, moles that are larger than a pencil eraser, or moles that have changed in shape or color.  Stay current with required vaccines (immunizations).  Influenza vaccine. All adults should be immunized every year.  Tetanus, diphtheria, and acellular pertussis (Td, Tdap) vaccine. Pregnant women should  receive 1 dose of Tdap vaccine during each pregnancy. The dose should be obtained regardless of the length of time since the last dose. Immunization is preferred during the 27th-36th week of gestation. An adult who has not previously received Tdap or who does not know her vaccine status should receive 1 dose of Tdap. This initial dose should be followed by tetanus and diphtheria toxoids (Td) booster doses every 10 years. Adults with an unknown or incomplete history of completing a 3-dose immunization series with Td-containing vaccines should begin or complete a primary immunization series including a Tdap dose. Adults should receive a Td booster every 10 years.  Varicella vaccine. An adult without evidence of immunity to varicella should receive 2 doses or a second dose if she has previously received 1 dose. Pregnant females who do not have evidence of immunity should receive the first dose after pregnancy. This first dose should be obtained before leaving the health care facility. The second dose should be obtained 4-8 weeks after the first dose.  Human papillomavirus (HPV) vaccine. Females aged 13-26 years who have not received the vaccine previously should obtain the 3-dose series. The vaccine is not recommended for use in pregnant females. However, pregnancy testing is not needed before receiving a dose. If a female is found to be pregnant after receiving a dose, no treatment is needed. In that case, the remaining doses should be delayed until after the pregnancy. Immunization is recommended for any person with an immunocompromised condition through the age of 26 years if she did not get any or all doses earlier. During the 3-dose series, the second dose should be obtained 4-8 weeks after the first dose. The third dose should be obtained 24 weeks after the first dose and 16 weeks after the second dose.  Zoster vaccine. One dose is recommended for adults aged 60 years or older unless certain conditions are  present.  Measles, mumps, and rubella (MMR) vaccine. Adults born before 1957 generally are considered immune to measles and mumps. Adults born in 1957 or later should have 1 or more doses of MMR vaccine unless there is a contraindication to the vaccine or there is laboratory evidence of immunity to   each of the three diseases. A routine second dose of MMR vaccine should be obtained at least 28 days after the first dose for students attending postsecondary schools, health care workers, or international travelers. People who received inactivated measles vaccine or an unknown type of measles vaccine during 1963-1967 should receive 2 doses of MMR vaccine. People who received inactivated mumps vaccine or an unknown type of mumps vaccine before 1979 and are at high risk for mumps infection should consider immunization with 2 doses of MMR vaccine. For females of childbearing age, rubella immunity should be determined. If there is no evidence of immunity, females who are not pregnant should be vaccinated. If there is no evidence of immunity, females who are pregnant should delay immunization until after pregnancy. Unvaccinated health care workers born before 1957 who lack laboratory evidence of measles, mumps, or rubella immunity or laboratory confirmation of disease should consider measles and mumps immunization with 2 doses of MMR vaccine or rubella immunization with 1 dose of MMR vaccine.  Pneumococcal 13-valent conjugate (PCV13) vaccine. When indicated, a person who is uncertain of her immunization history and has no record of immunization should receive the PCV13 vaccine. An adult aged 19 years or older who has certain medical conditions and has not been previously immunized should receive 1 dose of PCV13 vaccine. This PCV13 should be followed with a dose of pneumococcal polysaccharide (PPSV23) vaccine. The PPSV23 vaccine dose should be obtained at least 8 weeks after the dose of PCV13 vaccine. An adult aged 19  years or older who has certain medical conditions and previously received 1 or more doses of PPSV23 vaccine should receive 1 dose of PCV13. The PCV13 vaccine dose should be obtained 1 or more years after the last PPSV23 vaccine dose.  Pneumococcal polysaccharide (PPSV23) vaccine. When PCV13 is also indicated, PCV13 should be obtained first. All adults aged 65 years and older should be immunized. An adult younger than age 65 years who has certain medical conditions should be immunized. Any person who resides in a nursing home or long-term care facility should be immunized. An adult smoker should be immunized. People with an immunocompromised condition and certain other conditions should receive both PCV13 and PPSV23 vaccines. People with human immunodeficiency virus (HIV) infection should be immunized as soon as possible after diagnosis. Immunization during chemotherapy or radiation therapy should be avoided. Routine use of PPSV23 vaccine is not recommended for American Indians, [place] Natives, or people younger than 65 years unless there are medical conditions that require PPSV23 vaccine. When indicated, people who have unknown immunization and have no record of immunization should receive PPSV23 vaccine. One-time revaccination 5 years after the first dose of PPSV23 is recommended for people aged 19-64 years who have chronic kidney failure, nephrotic syndrome, asplenia, or immunocompromised conditions. People who received 1-2 doses of PPSV23 before age 65 years should receive another dose of PPSV23 vaccine at age 65 years or later if at least 5 years have passed since the previous dose. Doses of PPSV23 are not needed for people immunized with PPSV23 at or after age 65 years.  Meningococcal vaccine. Adults with asplenia or persistent complement component deficiencies should receive 2 doses of quadrivalent meningococcal conjugate (MenACWY-D) vaccine. The doses should be obtained at least 2 months apart.  Microbiologists working with certain meningococcal bacteria, military recruits, people at risk during an outbreak, and people who travel to or live in countries with a high rate of meningitis should be immunized. A first-year college student up through age   21 years who is living in a residence hall should receive a dose if she did not receive a dose on or after her 16th birthday. Adults who have certain high-risk conditions should receive one or more doses of vaccine.  Hepatitis A vaccine. Adults who wish to be protected from this disease, have certain high-risk conditions, work with hepatitis A-infected animals, work in hepatitis A research labs, or travel to or work in countries with a high rate of hepatitis A should be immunized. Adults who were previously unvaccinated and who anticipate close contact with an international adoptee during the first 60 days after arrival in the [place] from a country with a high rate of hepatitis A should be immunized.  Hepatitis B vaccine. Adults who wish to be protected from this disease, have certain high-risk conditions, may be exposed to blood or other infectious body fluids, are household contacts or sex partners of hepatitis B positive people, are clients or workers in certain care facilities, or travel to or work in countries with a high rate of hepatitis B should be immunized.  Haemophilus influenzae type b (Hib) vaccine. A previously unvaccinated person with asplenia or sickle cell disease or having a scheduled splenectomy should receive 1 dose of Hib vaccine. Regardless of previous immunization, a recipient of a hematopoietic stem cell transplant should receive a 3-dose series 6-12 months after her successful transplant. Hib vaccine is not recommended for adults with HIV infection. Preventive Services / Frequency Ages 64 to 68 years  Blood pressure check.** / Every 1 to 2 years.  Lipid and cholesterol check.** / Every 5 years beginning at age  22.  Clinical breast exam.** / Every 3 years for women in their 88s and 53s.  BRCA-related cancer risk assessment.** / For women who have family members with a BRCA-related cancer (breast, ovarian, tubal, or peritoneal cancers).  Pap test.** / Every 2 years from ages 90 through 51. Every 3 years starting at age 21 through age 56 or 3 with a history of 3 consecutive normal Pap tests.  HPV screening.** / Every 3 years from ages 24 through ages 1 to 46 with a history of 3 consecutive normal Pap tests.  Hepatitis C blood test.** / For any individual with known risks for hepatitis C.  Skin self-exam. / Monthly.  Influenza vaccine. / Every year.  Tetanus, diphtheria, and acellular pertussis (Tdap, Td) vaccine.** / Consult your health care provider. Pregnant women should receive 1 dose of Tdap vaccine during each pregnancy. 1 dose of Td every 10 years.  Varicella vaccine.** / Consult your health care provider. Pregnant females who do not have evidence of immunity should receive the first dose after pregnancy.  HPV vaccine. / 3 doses over 6 months, if 72 and younger. The vaccine is not recommended for use in pregnant females. However, pregnancy testing is not needed before receiving a dose.  Measles, mumps, rubella (MMR) vaccine.** / You need at least 1 dose of MMR if you were born in 1957 or later. You may also need a 2nd dose. For females of childbearing age, rubella immunity should be determined. If there is no evidence of immunity, females who are not pregnant should be vaccinated. If there is no evidence of immunity, females who are pregnant should delay immunization until after pregnancy.  Pneumococcal 13-valent conjugate (PCV13) vaccine.** / Consult your health care provider.  Pneumococcal polysaccharide (PPSV23) vaccine.** / 1 to 2 doses if you smoke cigarettes or if you have certain conditions.  Meningococcal vaccine.** /  1 dose if you are age 19 to 21 years and a first-year college  student living in a residence hall, or have one of several medical conditions, you need to get vaccinated against meningococcal disease. You may also need additional booster doses.  Hepatitis A vaccine.** / Consult your health care provider.  Hepatitis B vaccine.** / Consult your health care provider.  Haemophilus influenzae type b (Hib) vaccine.** / Consult your health care provider. Ages 40 to 64 years  Blood pressure check.** / Every 1 to 2 years.  Lipid and cholesterol check.** / Every 5 years beginning at age 20 years.  Lung cancer screening. / Every year if you are aged 55-80 years and have a 30-pack-year history of smoking and currently smoke or have quit within the past 15 years. Yearly screening is stopped once you have quit smoking for at least 15 years or develop a health problem that would prevent you from having lung cancer treatment.  Clinical breast exam.** / Every year after age 40 years.  BRCA-related cancer risk assessment.** / For women who have family members with a BRCA-related cancer (breast, ovarian, tubal, or peritoneal cancers).  Mammogram.** / Every year beginning at age 40 years and continuing for as long as you are in good health. Consult with your health care provider.  Pap test.** / Every 3 years starting at age 30 years through age 65 or 70 years with a history of 3 consecutive normal Pap tests.  HPV screening.** / Every 3 years from ages 30 years through ages 65 to 70 years with a history of 3 consecutive normal Pap tests.  Fecal occult blood test (FOBT) of stool. / Every year beginning at age 50 years and continuing until age 75 years. You may not need to do this test if you get a colonoscopy every 10 years.  Flexible sigmoidoscopy or colonoscopy.** / Every 5 years for a flexible sigmoidoscopy or every 10 years for a colonoscopy beginning at age 50 years and continuing until age 75 years.  Hepatitis C blood test.** / For all people born from 1945 through  1965 and any individual with known risks for hepatitis C.  Skin self-exam. / Monthly.  Influenza vaccine. / Every year.  Tetanus, diphtheria, and acellular pertussis (Tdap/Td) vaccine.** / Consult your health care provider. Pregnant women should receive 1 dose of Tdap vaccine during each pregnancy. 1 dose of Td every 10 years.  Varicella vaccine.** / Consult your health care provider. Pregnant females who do not have evidence of immunity should receive the first dose after pregnancy.  Zoster vaccine.** / 1 dose for adults aged 60 years or older.  Measles, mumps, rubella (MMR) vaccine.** / You need at least 1 dose of MMR if you were born in 1957 or later. You may also need a 2nd dose. For females of childbearing age, rubella immunity should be determined. If there is no evidence of immunity, females who are not pregnant should be vaccinated. If there is no evidence of immunity, females who are pregnant should delay immunization until after pregnancy.  Pneumococcal 13-valent conjugate (PCV13) vaccine.** / Consult your health care provider.  Pneumococcal polysaccharide (PPSV23) vaccine.** / 1 to 2 doses if you smoke cigarettes or if you have certain conditions.  Meningococcal vaccine.** / Consult your health care provider.  Hepatitis A vaccine.** / Consult your health care provider.  Hepatitis B vaccine.** / Consult your health care provider.  Haemophilus influenzae type b (Hib) vaccine.** / Consult your health care provider. Ages 65   years and over  Blood pressure check.** / Every 1 to 2 years.  Lipid and cholesterol check.** / Every 5 years beginning at age 22 years.  Lung cancer screening. / Every year if you are aged 73-80 years and have a 30-pack-year history of smoking and currently smoke or have quit within the past 15 years. Yearly screening is stopped once you have quit smoking for at least 15 years or develop a health problem that would prevent you from having lung cancer  treatment.  Clinical breast exam.** / Every year after age 4 years.  BRCA-related cancer risk assessment.** / For women who have family members with a BRCA-related cancer (breast, ovarian, tubal, or peritoneal cancers).  Mammogram.** / Every year beginning at age 40 years and continuing for as long as you are in good health. Consult with your health care provider.  Pap test.** / Every 3 years starting at age 9 years through age 34 or 91 years with 3 consecutive normal Pap tests. Testing can be stopped between 65 and 70 years with 3 consecutive normal Pap tests and no abnormal Pap or HPV tests in the past 10 years.  HPV screening.** / Every 3 years from ages 57 years through ages 64 or 45 years with a history of 3 consecutive normal Pap tests. Testing can be stopped between 65 and 70 years with 3 consecutive normal Pap tests and no abnormal Pap or HPV tests in the past 10 years.  Fecal occult blood test (FOBT) of stool. / Every year beginning at age 15 years and continuing until age 17 years. You may not need to do this test if you get a colonoscopy every 10 years.  Flexible sigmoidoscopy or colonoscopy.** / Every 5 years for a flexible sigmoidoscopy or every 10 years for a colonoscopy beginning at age 86 years and continuing until age 71 years.  Hepatitis C blood test.** / For all people born from [birth year] through 1963 and any individual with known risks for hepatitis C.  Osteoporosis screening.** / A one-time screening for women ages 83 years and over and women at risk for fractures or osteoporosis.  Skin self-exam. / Monthly.  Influenza vaccine. / Every year.  Tetanus, diphtheria, and acellular pertussis (Tdap/Td) vaccine.** / 1 dose of Td every 10 years.  Varicella vaccine.** / Consult your health care provider.  Zoster vaccine.** / 1 dose for adults aged 61 years or older.  Pneumococcal 13-valent conjugate (PCV13) vaccine.** / Consult your health care provider.  Pneumococcal  polysaccharide (PPSV23) vaccine.** / 1 dose for all adults aged 28 years and older.  Meningococcal vaccine.** / Consult your health care provider.  Hepatitis A vaccine.** / Consult your health care provider.  Hepatitis B vaccine.** / Consult your health care provider.  Haemophilus influenzae type b (Hib) vaccine.** / Consult your health care provider. ** Family history and personal history of risk and conditions may change your health care provider's recommendations. Document Released: 01/07/2002 Document Revised: 03/28/2014 Document Reviewed: 04/08/2011 [facility] Patient Information 2015 [place], [place]. This information is not intended to replace advice given to you by your health care provider. Make sure you discuss any questions you have with your health care provider.

## 2015-05-15 NOTE — Progress Notes (Signed)
Pre visit review using our clinic review tool, if applicable. No additional management support is needed unless otherwise documented below in the visit note. 

## 2015-05-16 NOTE — Assessment & Plan Note (Signed)

## 2015-05-17 ENCOUNTER — Other Ambulatory Visit: Payer: Self-pay | Admitting: Internal Medicine

## 2015-05-17 ENCOUNTER — Encounter: Payer: Self-pay | Admitting: Internal Medicine

## 2015-05-17 ENCOUNTER — Other Ambulatory Visit (INDEPENDENT_AMBULATORY_CARE_PROVIDER_SITE_OTHER): Payer: Medicare Other

## 2015-05-17 DIAGNOSIS — D510 Vitamin B12 deficiency anemia due to intrinsic factor deficiency: Secondary | ICD-10-CM | POA: Diagnosis not present

## 2015-05-17 LAB — FOLATE: Folate: >24.8 ng/mL (ref >5.9)

## 2015-05-17 LAB — CBC WITH DIFFERENTIAL/PLATELET
Basophils Absolute: 0.1 10*3/uL (ref 0.0–0.1)
Basophils Relative: 0.7 % (ref 0.0–3.0)
Eosinophils Absolute: 0.5 10*3/uL (ref 0.0–0.7)
Eosinophils Relative: 5.9 % — ABNORMAL HIGH (ref 0.0–5.0)
HCT: 28.2 % — ABNORMAL LOW (ref 36.0–46.0)
HEMOGLOBIN: 9.6 g/dL — AB (ref 12.0–15.0)
LYMPHS PCT: 14 % (ref 12.0–46.0)
Lymphs Abs: 1.1 10*3/uL (ref 0.7–4.0)
MCHC: 34.1 g/dL (ref 30.0–36.0)
MCV: 90.1 fl (ref 78.0–100.0)
MONOS PCT: 8 % (ref 3.0–12.0)
Monocytes Absolute: 0.6 10*3/uL (ref 0.1–1.0)
Neutro Abs: 5.5 10*3/uL (ref 1.4–7.7)
Neutrophils Relative %: 71.4 % (ref 43.0–77.0)
PLATELETS: 218 10*3/uL (ref 150.0–400.0)
RBC: 3.13 Mil/uL — ABNORMAL LOW (ref 3.87–5.11)
RDW: 18.7 % — AB (ref 11.5–15.5)
WBC: 7.8 10*3/uL (ref 4.0–10.5)

## 2015-05-17 LAB — IBC PANEL
Iron: 153 ug/dL — ABNORMAL HIGH (ref 42–145)
Saturation Ratios: 62.1 % — ABNORMAL HIGH (ref 20.0–50.0)
Transferrin: 176 mg/dL — ABNORMAL LOW (ref 212.0–360.0)

## 2015-05-17 LAB — FERRITIN: Ferritin: 387.8 ng/mL — ABNORMAL HIGH (ref 10.0–291.0)

## 2015-05-17 LAB — VITAMIN B12: Vitamin B-12: 788 pg/mL (ref 211–911)

## 2015-05-18 ENCOUNTER — Encounter: Payer: Self-pay | Admitting: Internal Medicine

## 2015-05-18 LAB — CULTURE, URINE COMPREHENSIVE: Colony Count: >=100000

## 2015-05-19 ENCOUNTER — Other Ambulatory Visit: Payer: Medicare Other

## 2015-05-19 DIAGNOSIS — D510 Vitamin B12 deficiency anemia due to intrinsic factor deficiency: Secondary | ICD-10-CM | POA: Diagnosis not present

## 2015-05-19 LAB — RETICULOCYTES

## 2015-05-20 ENCOUNTER — Other Ambulatory Visit: Payer: Self-pay | Admitting: Internal Medicine

## 2015-05-20 DIAGNOSIS — D638 Anemia in other chronic diseases classified elsewhere: Secondary | ICD-10-CM

## 2015-05-20 LAB — RETICULOCYTES
ABS Retic: 165.2 10*3/uL (ref 19.0–186.0)
RBC.: 2.95 MIL/uL — ABNORMAL LOW (ref 3.87–5.11)
Retic Ct Pct: 5.6 % — ABNORMAL HIGH (ref 0.4–2.3)

## 2015-06-21 ENCOUNTER — Telehealth: Payer: Self-pay | Admitting: Internal Medicine

## 2015-06-21 ENCOUNTER — Encounter: Payer: Self-pay | Admitting: Internal Medicine

## 2015-06-21 ENCOUNTER — Ambulatory Visit (HOSPITAL_BASED_OUTPATIENT_CLINIC_OR_DEPARTMENT_OTHER): Payer: Medicare Other

## 2015-06-21 ENCOUNTER — Ambulatory Visit (HOSPITAL_BASED_OUTPATIENT_CLINIC_OR_DEPARTMENT_OTHER): Payer: Medicare Other | Admitting: Internal Medicine

## 2015-06-21 VITALS — BP 154/53 | HR 88 | Temp 98.6°F | Resp 18 | Ht 63.5 in | Wt 175.7 lb

## 2015-06-21 DIAGNOSIS — D638 Anemia in other chronic diseases classified elsewhere: Secondary | ICD-10-CM

## 2015-06-21 DIAGNOSIS — D539 Nutritional anemia, unspecified: Secondary | ICD-10-CM

## 2015-06-21 DIAGNOSIS — D649 Anemia, unspecified: Secondary | ICD-10-CM

## 2015-06-21 LAB — COMPREHENSIVE METABOLIC PANEL (CC13)
ALT: 15 U/L (ref 0–55)
AST: 18 U/L (ref 5–34)
Albumin: 4 g/dL (ref 3.5–5.0)
Alkaline Phosphatase: 78 U/L (ref 40–150)
Anion Gap: 8 mEq/L (ref 3–11)
BUN: 26.4 mg/dL — ABNORMAL HIGH (ref 7.0–26.0)
CO2: 23 meq/L (ref 22–29)
Calcium: 8.9 mg/dL (ref 8.4–10.4)
Chloride: 110 mEq/L — ABNORMAL HIGH (ref 98–109)
Creatinine: 1.3 mg/dL — ABNORMAL HIGH (ref 0.6–1.1)
EGFR: 38 mL/min/{1.73_m2} — AB (ref >90)
GLUCOSE: 118 mg/dL (ref 70–140)
Potassium: 4.5 mEq/L (ref 3.5–5.1)
Sodium: 141 mEq/L (ref 136–145)
TOTAL PROTEIN: 6.1 g/dL — AB (ref 6.4–8.3)
Total Bilirubin: 1.78 mg/dL — ABNORMAL HIGH (ref 0.20–1.20)

## 2015-06-21 LAB — CBC & DIFF AND RETIC
BASO%: 0.6 % (ref 0.0–2.0)
Basophils Absolute: 0 10*3/uL (ref 0.0–0.1)
EOS ABS: 0.2 10*3/uL (ref 0.0–0.5)
EOS%: 3.4 % (ref 0.0–7.0)
HEMATOCRIT: 28.1 % — AB (ref 34.8–46.6)
HGB: 9.4 g/dL — ABNORMAL LOW (ref 11.6–15.9)
IMMATURE RETIC FRACT: 5.4 % (ref 1.60–10.00)
LYMPH%: 12.6 % — AB (ref 14.0–49.7)
MCH: 30.5 pg (ref 25.1–34.0)
MCHC: 33.5 g/dL (ref 31.5–36.0)
MCV: 91.2 fL (ref 79.5–101.0)
MONO#: 0.7 10*3/uL (ref 0.1–0.9)
MONO%: 9.7 % (ref 0.0–14.0)
NEUT#: 5.3 10*3/uL (ref 1.5–6.5)
NEUT%: 73.7 % (ref 38.4–76.8)
Platelets: 197 10*3/uL (ref 145–400)
RBC: 3.08 10*6/uL — ABNORMAL LOW (ref 3.70–5.45)
RDW: 18.5 % — ABNORMAL HIGH (ref 11.2–14.5)
Retic %: 7.14 % — ABNORMAL HIGH (ref 0.70–2.10)
Retic Ct Abs: 219.91 10*3/uL — ABNORMAL HIGH (ref 33.70–90.70)
WBC: 7.2 10*3/uL (ref 3.9–10.3)
lymph#: 0.9 10*3/uL (ref 0.9–3.3)

## 2015-06-21 LAB — IRON AND TIBC CHCC
%SAT: 48 % (ref 21–57)
Iron: 104 ug/dL (ref 41–142)
TIBC: 215 ug/dL — ABNORMAL LOW (ref 236–444)
UIBC: 111 ug/dL — AB (ref 120–384)

## 2015-06-21 LAB — FERRITIN CHCC: FERRITIN: 176 ng/mL (ref 9–269)

## 2015-06-21 LAB — LACTATE DEHYDROGENASE (CC13): LDH: 220 U/L (ref 125–245)

## 2015-06-21 NOTE — Telephone Encounter (Signed)
Gave and printed appt sched and avs for pt for Aug °

## 2015-06-21 NOTE — Progress Notes (Signed)
[facility] [phone]   [phone]  OFFICE PROGRESS NOTE  [name], MD [address] [place] [address]  DIAGNOSIS: Persistent anemia questionable for anemia of chronic disease.  PRIOR THERAPY: Prednisone 5 mg by mouth twice a day for questionable hemolytic anemia in April through June 2015  CURRENT THERAPY: None  INTERVAL HISTORY: Claudia Lara 81 y.o. female returns to the clinic today for follow-up visit. She is a former patient of Dr. Juliann Mule is here today for evaluation and to establish care with me. The patient was seen last year for evaluation of persistent anemia and extensive workup at that time showed low haptoglobin. She was restarted on prednisone for almost 2 months but no significant improvement in her condition. She had a lot of insomnia at that time. She was seen recently by her primary care physician and repeat blood work was unremarkable except for the persistent anemia. She was referred to me today for evaluation and recommendation regarding her condition. When seen today she continues to complain of increasing fatigue and occasional dizzy spells. She denied having any significant weight loss or night sweats. The patient denied having any chest pain, shortness of breath, cough or hemoptysis. She denied having any nausea or vomiting. She has no bleeding issues.  MEDICAL HISTORY: Past Medical History  Diagnosis Date  . ANEMIA 08/22/2009  . Unspecified essential hypertension 10/19/2007  . CAROTID ARTERY STENOSIS 01/16/2010  . PVD 10/06/2007  . HEMORRHOIDS, INTERNAL     surgery was in the 90's (per patient)  . GEN OSTEOARTHROSIS INVOLVING MULTIPLE SITES 11/11/2008  . NECK PAIN, ACUTE 12/28/2009  . PERIPHERAL EDEMA 10/06/2007  . DYSPNEA ON EXERTION 11/16/2007    ALLERGIES:  is allergic to sulfonamide derivatives.  MEDICATIONS:  Current Outpatient Prescriptions  Medication Sig Dispense Refill  . Ascorbic Acid  (VITAMIN C) 500 MG tablet Take 500 mg by mouth daily.      . Calcium Carbonate-Vit D-Min (CALCIUM 600 + MINERALS) 600-200 MG-UNIT TABS Take by mouth daily.      . carvedilol (COREG) 12.5 MG tablet TAKE 1 TABLET BY MOUTH 2 TIMES DAILY WITH A MEAL. 360 tablet 1  . cholecalciferol (VITAMIN D) 1000 UNITS tablet Take 1,000 Units by mouth daily.    . Glucosamine-Chondroit-Vit C-Mn (GLUCOSAMINE 1500 COMPLEX) CAPS Take by mouth daily.      . meloxicam (MOBIC) 15 MG tablet Take 1 tablet (15 mg total) by mouth daily. 90 tablet 2  . omeprazole (PRILOSEC) 40 MG capsule Take 40 mg by mouth daily.      [name] torsemide (DEMADEX) 10 MG tablet Take 1 tablet (10 mg total) by mouth daily. 90 tablet 1  . valsartan (DIOVAN) 320 MG tablet Take 1 tablet (320 mg total) by mouth daily. 90 tablet 3  . vitamin E 400 UNIT capsule Take 400 Units by mouth daily.     No current facility-administered medications for this visit.    SURGICAL HISTORY:  Past Surgical History  Procedure Laterality Date  . Hyperplastic colon polyps, removed  2007    By Dr. Penelope Coop  . Oophorectomy    . Rotator cuff repair  2004    left (Dr. Durward Fortes)  . Abdominal hysterectomy    . Joint replacement  2011    right knee  . Total knee arthroplasty Right     REVIEW OF SYSTEMS:  Constitutional: positive for fatigue Eyes: negative Ears, nose, mouth, throat, and face: negative Respiratory: negative  Cardiovascular: negative Gastrointestinal: negative Genitourinary:negative Integument/breast: negative Hematologic/lymphatic: negative Musculoskeletal:negative Neurological: positive for dizziness Behavioral/Psych: negative Endocrine: negative Allergic/Immunologic: negative   PHYSICAL EXAMINATION: General appearance: alert, cooperative, fatigued and no distress Head: Normocephalic, without obvious abnormality, atraumatic Neck: no adenopathy, no JVD, supple, symmetrical, trachea midline and thyroid not enlarged, symmetric, no  tenderness/mass/nodules Lymph nodes: Cervical, supraclavicular, and axillary nodes normal. Resp: clear to auscultation bilaterally Back: symmetric, no curvature. ROM normal. No CVA tenderness. Cardio: regular rate and rhythm, S1, S2 normal, no murmur, click, rub or gallop GI: soft, non-tender; bowel sounds normal; no masses,  no organomegaly Extremities: extremities normal, atraumatic, no cyanosis or edema Neurologic: Alert and oriented X 3, normal strength and tone. Normal symmetric reflexes. Normal coordination and gait  ECOG PERFORMANCE STATUS: 1 - Symptomatic but completely ambulatory  Blood pressure 154/53, pulse 88, temperature 98.6 F (37 C), resp. rate 18, height 5' 3.5" (1.613 m), weight 175 lb 11.2 oz (79.697 kg), SpO2 94 %.  LABORATORY DATA: Lab Results  Component Value Date   WBC 7.8 05/17/2015   HGB 9.6* 05/17/2015   HCT 28.2* 05/17/2015   MCV 90.1 05/17/2015   PLT 218.0 05/17/2015      Chemistry      Component Value Date/Time   NA 138 05/15/2015 1406   NA 143 02/14/2014 1057   K 4.4 05/15/2015 1406   K 4.9 02/14/2014 1057   CL 107 05/15/2015 1406   CO2 25 05/15/2015 1406   CO2 25 02/14/2014 1057   BUN 30* 05/15/2015 1406   BUN 29.8* 02/14/2014 1057   CREATININE 1.29* 05/15/2015 1406   CREATININE 1.1 02/14/2014 1057      Component Value Date/Time   CALCIUM 9.3 05/15/2015 1406   CALCIUM 9.2 02/14/2014 1057   ALKPHOS 76 05/15/2015 1406   ALKPHOS 80 02/14/2014 1057   AST 16 05/15/2015 1406   AST 18 02/14/2014 1057   ALT 12 05/15/2015 1406   ALT 15 02/14/2014 1057   BILITOT 1.6* 05/15/2015 1406   BILITOT 1.54* 02/14/2014 1057       RADIOGRAPHIC STUDIES: No results found.  ASSESSMENT AND PLAN: This is a very pleasant 81 years old white female with persistent anemia of unclear etiology questionable for anemia of chronic disease plus/minus mild hemolysis. I had a lengthy discussion with the patient today about her current condition. I will order  several studies for evaluation of her anemia including repeat CBC, comprehensive metabolic panel, LDH, haptoglobin, iron study, ferritin, serum folate, serum vitamin B 12 level, serum erythropoietin, serum protein electrophoresis as well as transferrin receptors. I will also arrange for the patient to have CT-guided bone marrow biopsy and aspirate to rule out any underlying myelodysplastic or myeloproliferative disorder. I will see the patient back for follow-up visit in 3 weeks for reevaluation and discussion of her lab and biopsy results as well as recommendation regarding treatment of her condition. The patient was advised to call immediately if she has any concerning symptoms in the interval. The patient voices understanding of current disease status and treatment options and is in agreement with the current care plan.  All questions were answered. The patient knows to call the clinic with any problems, questions or concerns. We can certainly see the patient much sooner if necessary.  I spent 15 minutes counseling the patient face to face. The total time spent in the appointment was 25 minutes.  Disclaimer: This note was dictated with voice recognition software. Similar sounding words can inadvertently be transcribed and may not  be corrected upon review.

## 2015-06-26 LAB — HEAVY METALS, BLOOD: Lead: <2 ug/dL (ref <10)

## 2015-06-26 LAB — PROTEIN ELECTROPHORESIS, SERUM, WITH REFLEX
ALPHA-2-GLOBULIN: 0.5 g/dL (ref 0.5–0.9)
Albumin ELP: 3.9 g/dL (ref 3.8–4.8)
Alpha-1-Globulin: 0.2 g/dL (ref 0.2–0.3)
Beta 2: 0.2 g/dL (ref 0.2–0.5)
Beta Globulin: 0.3 g/dL — ABNORMAL LOW (ref 0.4–0.6)
Gamma Globulin: 0.7 g/dL — ABNORMAL LOW (ref 0.8–1.7)
Total Protein, Serum Electrophoresis: 5.9 g/dL — ABNORMAL LOW (ref 6.1–8.1)

## 2015-06-26 LAB — HAPTOGLOBIN: HAPTOGLOBIN: 24 mg/dL — AB (ref 43–212)

## 2015-06-26 LAB — IGG, IGA, IGM
IgA: 66 mg/dL — ABNORMAL LOW (ref 69–380)
IgG (Immunoglobin G), Serum: 952 mg/dL (ref 690–1700)
IgM, Serum: 165 mg/dL (ref 52–322)

## 2015-06-26 LAB — ERYTHROPOIETIN: ERYTHROPOIETIN: 56.4 m[IU]/mL — AB (ref 2.6–18.5)

## 2015-06-26 LAB — IFE INTERPRETATION

## 2015-06-26 LAB — DIRECT ANTIGLOBULIN RFX ANTI-C3/IGG: DAT, Polyspecific: NEGATIVE

## 2015-06-26 LAB — FOLATE: Folate: >20 ng/mL

## 2015-06-26 LAB — VITAMIN B12: VITAMIN B 12: 517 pg/mL (ref 211–911)

## 2015-07-04 ENCOUNTER — Other Ambulatory Visit: Payer: Self-pay | Admitting: Radiology

## 2015-07-05 ENCOUNTER — Ambulatory Visit (HOSPITAL_COMMUNITY)
Admission: RE | Admit: 2015-07-05 | Discharge: 2015-07-05 | Disposition: A | Discharge status: Home / Self Care | Payer: Medicare Other | Source: Ambulatory Visit | Attending: Internal Medicine | Admitting: Internal Medicine

## 2015-07-05 ENCOUNTER — Encounter (HOSPITAL_COMMUNITY): Payer: Self-pay

## 2015-07-05 DIAGNOSIS — D539 Nutritional anemia, unspecified: Secondary | ICD-10-CM

## 2015-07-05 DIAGNOSIS — I739 Peripheral vascular disease, unspecified: Secondary | ICD-10-CM | POA: Diagnosis not present

## 2015-07-05 DIAGNOSIS — D638 Anemia in other chronic diseases classified elsewhere: Secondary | ICD-10-CM

## 2015-07-05 DIAGNOSIS — I10 Essential (primary) hypertension: Secondary | ICD-10-CM | POA: Diagnosis not present

## 2015-07-05 DIAGNOSIS — Z79899 Other long term (current) drug therapy: Secondary | ICD-10-CM | POA: Diagnosis not present

## 2015-07-05 DIAGNOSIS — Z882 Allergy status to sulfonamides status: Secondary | ICD-10-CM | POA: Insufficient documentation

## 2015-07-05 DIAGNOSIS — D7589 Other specified diseases of blood and blood-forming organs: Secondary | ICD-10-CM | POA: Diagnosis not present

## 2015-07-05 DIAGNOSIS — D649 Anemia, unspecified: Secondary | ICD-10-CM | POA: Diagnosis not present

## 2015-07-05 DIAGNOSIS — M159 Polyosteoarthritis, unspecified: Secondary | ICD-10-CM | POA: Insufficient documentation

## 2015-07-05 LAB — CBC WITH DIFFERENTIAL/PLATELET
Basophils Absolute: 0 10*3/uL (ref 0.0–0.1)
Basophils Relative: 0 % (ref 0–1)
EOS PCT: 5 % (ref 0–5)
Eosinophils Absolute: 0.3 10*3/uL (ref 0.0–0.7)
HEMATOCRIT: 28.5 % — AB (ref 36.0–46.0)
HEMOGLOBIN: 9.4 g/dL — AB (ref 12.0–15.0)
LYMPHS ABS: 1 10*3/uL (ref 0.7–4.0)
Lymphocytes Relative: 17 % (ref 12–46)
MCH: 30.4 pg (ref 26.0–34.0)
MCHC: 33 g/dL (ref 30.0–36.0)
MCV: 92.2 fL (ref 78.0–100.0)
Monocytes Absolute: 0.4 10*3/uL (ref 0.1–1.0)
Monocytes Relative: 6 % (ref 3–12)
NEUTROS ABS: 4.4 10*3/uL (ref 1.7–7.7)
Neutrophils Relative %: 72 % (ref 43–77)
Platelets: 201 10*3/uL (ref 150–400)
RBC: 3.09 MIL/uL — AB (ref 3.87–5.11)
RDW: 18.5 % — ABNORMAL HIGH (ref 11.5–15.5)
WBC: 6.1 10*3/uL (ref 4.0–10.5)

## 2015-07-05 LAB — PROTIME-INR
INR: 1.17 (ref 0.00–1.49)
Prothrombin Time: 15.1 seconds (ref 11.6–15.2)

## 2015-07-05 LAB — BONE MARROW EXAM

## 2015-07-05 MED ORDER — MIDAZOLAM HCL 2 MG/2ML IJ SOLN
INTRAMUSCULAR | Status: AC | PRN
Start: 2015-07-05 — End: 2015-07-05
  Administered 2015-07-05 (×2): 0.5 mg via INTRAVENOUS
  Administered 2015-07-05: 1 mg via INTRAVENOUS

## 2015-07-05 MED ORDER — SODIUM CHLORIDE 0.9 % IV SOLN
INTRAVENOUS | Status: DC
  Administered 2015-07-05: 10:00:00 via INTRAVENOUS

## 2015-07-05 MED ORDER — HYDROCODONE-ACETAMINOPHEN 5-325 MG PO TABS
1.0000 | ORAL_TABLET | ORAL | Status: DC | PRN
  Filled 2015-07-05: qty 2

## 2015-07-05 MED ORDER — FENTANYL CITRATE (PF) 100 MCG/2ML IJ SOLN
INTRAMUSCULAR | Status: AC | PRN
  Administered 2015-07-05: 25 ug via INTRAVENOUS
  Administered 2015-07-05: 50 ug via INTRAVENOUS

## 2015-07-05 MED ORDER — MIDAZOLAM HCL 2 MG/2ML IJ SOLN
INTRAMUSCULAR | Status: AC
  Filled 2015-07-05: qty 4

## 2015-07-05 MED ORDER — FENTANYL CITRATE (PF) 100 MCG/2ML IJ SOLN
INTRAMUSCULAR | Status: AC
  Filled 2015-07-05: qty 2

## 2015-07-05 NOTE — Procedures (Signed)
Interventional Radiology Procedure Note  Procedure: CT guided aspirate and core biopsy of right iliac bone Complications: None Bedrest x 1 hour  Deshanna Kama T. Kathlene Cote, M.D Pager:  [phone]

## 2015-07-05 NOTE — Discharge Instructions (Signed)
Bone Marrow Aspiration and Bone Biopsy Examination of the bone marrow is a valuable test to diagnose blood disorders. A bone marrow biopsy takes a sample of bone and a small amount of fluid and cells from inside the bone. A bone marrow aspiration removes only the marrow. Bone marrow aspiration and bone biopsies are used to stage different disorders of the blood, such as leukemia. Staging will help your caregiver understand how far the disease has progressed.  The tests are also useful in diagnosing:  Fever of unknown origin (FUO).  Bacterial infections and other widespread fungal infections.  Cancers that have spread (metastasized) to the bone marrow.  Diseases that are characterized by a deficiency of an enzyme (storage diseases). This includes:  Niemann-Pick disease.  Gaucher disease. PROCEDURE  Sites used to get samples include:   Back of your hip bone (posterior iliac crest).  Both aspiration and biopsy.  Front of your hip bone (anterior iliac crest).  Both aspiration and biopsy.  Breastbone (sternum).  Aspiration from your breastbone (done only in adults). This method is rarely used. When you get a hip bone aspiration:  You are placed lying on your side with the upper knee brought up and flexed with the lower leg straight.  The site is prepared, cleaned with an antiseptic scrub, and draped. This keeps the biopsy area clean.  The skin and the area down to the lining of the bone (periosteum) are made numb with a local anesthetic.  The bone marrow aspiration needle is inserted. You will feel pressure on your bone.  Once inside the marrow cavity, a sample of bone marrow is sucked out (aspirated) for pathology slides.  The material collected for bone marrow slides is processed immediately by a technologist.  The technician selects the marrow particles to make the slides for pathology.  The marrow aspiration needle is removed. Then pressure is applied to the site with  gauze until bleeding has stopped. Following an aspiration, a bone marrow biopsy may be performed as well. The technique for this is very similar. A dressing is then applied.  RISKS AND COMPLICATIONS  The main complications of a bone marrow aspiration and biopsy include infection and bleeding.  Complications are uncommon. The procedure may not be performed in patients with bleeding tendencies.  A very rare complication from the procedure is injury to the heart during a breastbone (sternal) marrow aspiration. Only bone marrow aspirations are performed in this area.  Long-lasting pain at the site of the bone marrow aspiration and biopsy is uncommon. Your caregiver will let you know when you are to get your results and will discuss them with you. You may make an appointment with your caregiver to find out the results. Do not assume everything is normal if you have not heard from your caregiver or the medical facility. It is important for you to follow up on all of your test results. Document Released: 11/14/2004 Document Revised: 02/03/2012 Document Reviewed: 11/08/2008 [facility] Patient Information 2015 [place], [place]. This information is not intended to replace advice given to you by your health care provider. Make sure you discuss any questions you have with your health care provider.  May remove dressing and shower or bathe in 24hours. Keep site clean and dry. Conscious Sedation Sedation is the use of medicines to promote relaxation and relieve discomfort and anxiety. Conscious sedation is a type of sedation. Under conscious sedation you are less alert than normal but are still able to respond to instructions or stimulation. Conscious  sedation is used during short medical and dental procedures. It is milder than deep sedation or general anesthesia and allows you to return to your regular activities sooner.  LET [facility] CARE PROVIDER KNOW ABOUT:   Any allergies you have.  All medicines you  are taking, including vitamins, herbs, eye drops, creams, and over-the-counter medicines.  Use of steroids (by mouth or creams).  Previous problems you or members of your family have had with the use of anesthetics.  Any blood disorders you have.  Previous surgeries you have had.  Medical conditions you have.  Possibility of pregnancy, if this applies.  Use of cigarettes, alcohol, or illegal drugs. RISKS AND COMPLICATIONS Generally, this is a safe procedure. However, as with any procedure, problems can occur. Possible problems include:  Oversedation.  Trouble breathing on your own. You may need to have a breathing tube until you are awake and breathing on your own.  Allergic reaction to any of the medicines used for the procedure. BEFORE THE PROCEDURE  You may have blood tests done. These tests can help show how well your kidneys and liver are working. They can also show how well your blood clots.  A physical exam will be done.  Only take medicines as directed by your health care provider. You may need to stop taking medicines (such as blood thinners, aspirin, or nonsteroidal anti-inflammatory drugs) before the procedure.   Do not eat or drink at least 6 hours before the procedure or as directed by your health care provider.  Arrange for a responsible adult, family member, or friend to take you home after the procedure. He or she should stay with you for at least 24 hours after the procedure, until the medicine has worn off. PROCEDURE   An intravenous (IV) catheter will be inserted into one of your veins. Medicine will be able to flow directly into your body through this catheter. You may be given medicine through this tube to help prevent pain and help you relax.  The medical or dental procedure will be done. AFTER THE PROCEDURE  You will stay in a recovery area until the medicine has worn off. Your blood pressure and pulse will be checked.   Depending on the  procedure you had, you may be allowed to go home when you can tolerate liquids and your pain is under control. Document Released: 08/06/2001 Document Revised: 11/16/2013 Document Reviewed: 07/19/2013 [facility] Patient Information 2015 [place], [place]. This information is not intended to replace advice given to you by your health care provider. Make sure you discuss any questions you have with your health care provider.   Conscious Sedation, Adult, Care After Refer to this sheet in the next few weeks. These instructions provide you with information on caring for yourself after your procedure. Your health care provider may also give you more specific instructions. Your treatment has been planned according to current medical practices, but problems sometimes occur. Call your health care provider if you have any problems or questions after your procedure. WHAT TO EXPECT AFTER THE PROCEDURE  After your procedure:  You may feel sleepy, clumsy, and have poor balance for several hours.  Vomiting may occur if you eat too soon after the procedure. HOME CARE INSTRUCTIONS  Do not participate in any activities where you could become injured for at least 24 hours. Do not:  Drive.  Swim.  Ride a bicycle.  Operate heavy machinery.  Cook.  Use power tools.  Climb ladders.  Work from a  high place.  Do not make important decisions or sign legal documents until you are improved.  If you vomit, drink water, juice, or soup when you can drink without vomiting. Make sure you have little or no nausea before eating solid foods.  Only take over-the-counter or prescription medicines for pain, discomfort, or fever as directed by your health care provider.  Make sure you and your family fully understand everything about the medicines given to you, including what side effects may occur.  You should not drink alcohol, take sleeping pills, or take medicines that cause drowsiness for at least 24 hours.  If  you smoke, do not smoke without supervision.  If you are feeling better, you may resume normal activities 24 hours after you were sedated.  Keep all appointments with your health care provider. SEEK MEDICAL CARE IF:  Your skin is pale or bluish in color.  You continue to feel nauseous or vomit.  Your pain is getting worse and is not helped by medicine.  You have bleeding or swelling.  You are still sleepy or feeling clumsy after 24 hours. SEEK IMMEDIATE MEDICAL CARE IF:  You develop a rash.  You have difficulty breathing.  You develop any type of allergic problem.  You have a fever. MAKE SURE YOU:  Understand these instructions.  Will watch your condition.  Will get help right away if you are not doing well or get worse. Document Released: 09/01/2013 Document Reviewed: 09/01/2013 [facility] Patient Information 2015 [place], [place]. This information is not intended to replace advice given to you by your health care provider. Make sure you discuss any questions you have with your health care provider.

## 2015-07-05 NOTE — H&P (Signed)
Chief Complaint: Patient was seen in consultation today for CT guided bone marrow biopsy    Referring Physician(s): Mohamed,Mohamed  History of Present Illness: Claudia Lara is a 81 y.o. female with history of persistent anemia of unclear etiology who presents today for CT guided bone marrow biopsy for further evaluation.   Past Medical History  Diagnosis Date  . ANEMIA 08/22/2009  . Unspecified essential hypertension 10/19/2007  . CAROTID ARTERY STENOSIS 01/16/2010  . PVD 10/06/2007  . HEMORRHOIDS, INTERNAL     surgery was in the 90's (per patient)  . GEN OSTEOARTHROSIS INVOLVING MULTIPLE SITES 11/11/2008  . NECK PAIN, ACUTE 12/28/2009  . PERIPHERAL EDEMA 10/06/2007  . DYSPNEA ON EXERTION 11/16/2007    Past Surgical History  Procedure Laterality Date  . Hyperplastic colon polyps, removed  2007    By Dr. Penelope Coop  . Oophorectomy    . Rotator cuff repair  2004    left (Dr. Durward Fortes)  . Abdominal hysterectomy    . Joint replacement  2011    right knee  . Total knee arthroplasty Right     Allergies: Sulfonamide derivatives  Medications: Prior to Admission medications   Medication Sig Start Date End Date Taking? Authorizing Provider  Ascorbic Acid (VITAMIN C) 500 MG tablet Take 500 mg by mouth every morning.    Yes Historical Provider, MD  Calcium Carbonate-Vit D-Min (CALCIUM 600 + MINERALS) 600-200 MG-UNIT TABS Take 1 tablet by mouth every morning.    Yes Historical Provider, MD  carvedilol (COREG) 12.5 MG tablet TAKE 1 TABLET BY MOUTH 2 TIMES DAILY WITH A MEAL. 11/08/14  Yes Janith Lima, MD  cholecalciferol (VITAMIN D) 1000 UNITS tablet Take 1,000 Units by mouth every morning.    Yes Historical Provider, MD  Glucosamine-Chondroit-Vit C-Mn (GLUCOSAMINE 1500 COMPLEX) CAPS Take 1 capsule by mouth at bedtime.     Historical Provider, MD  meloxicam (MOBIC) 15 MG tablet Take 1 tablet (15 mg total) by mouth daily. 10/31/14   Janith Lima, MD  Multiple Vitamins-Minerals  (CENTRUM SILVER ADULT 50+) TABS Take 1 tablet by mouth every morning.    Historical Provider, MD  omeprazole (PRILOSEC) 20 MG capsule Take 20 mg by mouth every morning.    Historical Provider, MD  Tetrahydrozoline HCl (VISINE OP) Apply 1-2 drops to eye daily as needed (dry eyes.).    Historical Provider, MD  torsemide (DEMADEX) 10 MG tablet Take 1 tablet (10 mg total) by mouth daily. Patient taking differently: Take 5 mg by mouth 3 (three) times a week.  01/30/15   Janith Lima, MD  valsartan (DIOVAN) 320 MG tablet Take 1 tablet (320 mg total) by mouth daily. 01/30/15   Janith Lima, MD     Family History  Problem Relation Age of Onset  . Ovarian cancer Mother   . Cancer Mother   . Leukemia Father   . Lung cancer Father   . Cancer Father   . Diabetes Neg Hx   . Heart disease Neg Hx   . Hypertension Neg Hx   . Cancer Brother     Social History   Social History  . Marital Status: Divorced    Spouse Name: N/A  . Number of Children: N/A  . Years of Education: 12   Occupational History  . Network engineer     retired   Social History Main Topics  . Smoking status: Never Smoker   . Smokeless tobacco: Never Used  . Alcohol Use: No  .  Drug Use: No  . Sexual Activity: Not Currently   Other Topics Concern  . None   Social History Narrative   HSG. Married - divorced for many years.  Retired. Lives alone - very active and independent.      Review of Systems  Constitutional: Positive for fatigue. Negative for fever and chills.  Respiratory: Negative for cough.        Some dyspnea with exertion  Cardiovascular: Negative for chest pain.  Gastrointestinal: Negative for nausea, vomiting, abdominal pain and blood in stool.  Genitourinary: Negative for dysuria and hematuria.  Musculoskeletal: Negative for back pain.  Neurological: Negative for headaches.       Occ dizziness    Vital Signs: BP 178/60 mmHg  Pulse 72  Temp(Src) 98.2 F (36.8 C) (Oral)  Resp 18  SpO2  100%  Physical Exam  Constitutional: She is oriented to person, place, and time. She appears well-developed and well-nourished.  Cardiovascular: Normal rate and regular rhythm.   Pulmonary/Chest: Effort normal and breath sounds normal.  Abdominal: Soft. Bowel sounds are normal. There is no tenderness.  Musculoskeletal: Normal range of motion.  Trace pretibial edema bilat  Neurological: She is alert and oriented to person, place, and time.    Mallampati Score:     Imaging: No results found.  Labs:  CBC:  Recent Labs  01/04/15 1432 05/15/15 1406 05/17/15 1153 06/21/15 1008  WBC 7.2 8.6 7.8 7.2  HGB 10.3* 9.8* 9.6* 9.4*  HCT 30.0* 28.4* 28.2* 28.1*  PLT 242.0 218.0 218.0 197    COAGS: No results for input(s): INR, APTT in the last 8760 hours.  BMP:  Recent Labs  01/04/15 1432 05/15/15 1406 06/21/15 1007  NA 139 138 141  K 4.0 4.4 4.5  CL 107 107  --   CO2 26 25 23   GLUCOSE 135* 118* 118  BUN 30* 30* 26.4*  CALCIUM 9.2 9.3 8.9  CREATININE 1.12 1.29* 1.3*    LIVER FUNCTION TESTS:  Recent Labs  05/15/15 1406 06/21/15 1007  BILITOT 1.6* 1.78*  AST 16 18  ALT 12 15  ALKPHOS 76 78  PROT 6.3 6.1*  ALBUMIN 4.3 4.0    TUMOR MARKERS: No results for input(s): AFPTM, CEA, CA199, CHROMGRNA in the last 8760 hours.  Assessment and Plan: Claudia Lara is a 81 y.o. female with history of persistent anemia of unclear etiology who presents today for CT guided bone marrow biopsy for further evaluation. Risks and benefits discussed with the patient including, but not limited to bleeding, infection, damage to adjacent structures or low yield requiring additional tests. All of the patient's questions were answered, patient is agreeable to proceed. Consent signed and in chart.     Thank you for this interesting consult.  I greatly enjoyed meeting YARELIS AMBROSINO and look forward to participating in their care.  A copy of this report was sent to the requesting  provider on this date.  Signed: D. Rowe Robert 07/05/2015, 10:26 AM  I spent a total of 15 minutes in face to face in clinical consultation, greater than 50% of which was counseling/coordinating care for CT guided bone marrow biopsy

## 2015-07-11 ENCOUNTER — Other Ambulatory Visit: Payer: Self-pay | Admitting: Medical Oncology

## 2015-07-11 DIAGNOSIS — D638 Anemia in other chronic diseases classified elsewhere: Secondary | ICD-10-CM

## 2015-07-12 ENCOUNTER — Telehealth: Payer: Self-pay | Admitting: Internal Medicine

## 2015-07-12 ENCOUNTER — Ambulatory Visit (HOSPITAL_BASED_OUTPATIENT_CLINIC_OR_DEPARTMENT_OTHER): Payer: Medicare Other | Admitting: Internal Medicine

## 2015-07-12 ENCOUNTER — Encounter: Payer: Self-pay | Admitting: Internal Medicine

## 2015-07-12 ENCOUNTER — Other Ambulatory Visit (HOSPITAL_BASED_OUTPATIENT_CLINIC_OR_DEPARTMENT_OTHER): Payer: Medicare Other

## 2015-07-12 VITALS — BP 162/49 | HR 77 | Temp 98.2°F | Resp 18 | Ht 63.5 in | Wt 176.2 lb

## 2015-07-12 DIAGNOSIS — D649 Anemia, unspecified: Secondary | ICD-10-CM

## 2015-07-12 DIAGNOSIS — D638 Anemia in other chronic diseases classified elsewhere: Secondary | ICD-10-CM

## 2015-07-12 DIAGNOSIS — D589 Hereditary hemolytic anemia, unspecified: Secondary | ICD-10-CM

## 2015-07-12 LAB — COMPREHENSIVE METABOLIC PANEL (CC13)
ALT: 12 U/L (ref 0–55)
ANION GAP: 9 meq/L (ref 3–11)
AST: 15 U/L (ref 5–34)
Albumin: 3.9 g/dL (ref 3.5–5.0)
Alkaline Phosphatase: 79 U/L (ref 40–150)
BUN: 33.3 mg/dL — ABNORMAL HIGH (ref 7.0–26.0)
CALCIUM: 8.9 mg/dL (ref 8.4–10.4)
CHLORIDE: 111 meq/L — AB (ref 98–109)
CO2: 21 mEq/L — ABNORMAL LOW (ref 22–29)
Creatinine: 1.7 mg/dL — ABNORMAL HIGH (ref 0.6–1.1)
EGFR: 27 mL/min/{1.73_m2} — ABNORMAL LOW (ref >90)
Glucose: 145 mg/dl — ABNORMAL HIGH (ref 70–140)
Potassium: 4.5 mEq/L (ref 3.5–5.1)
Sodium: 141 mEq/L (ref 136–145)
Total Bilirubin: 1.64 mg/dL — ABNORMAL HIGH (ref 0.20–1.20)
Total Protein: 6.1 g/dL — ABNORMAL LOW (ref 6.4–8.3)

## 2015-07-12 LAB — CBC WITH DIFFERENTIAL/PLATELET
BASO%: 0.3 % (ref 0.0–2.0)
BASOS ABS: 0 10*3/uL (ref 0.0–0.1)
EOS%: 6.2 % (ref 0.0–7.0)
Eosinophils Absolute: 0.4 10*3/uL (ref 0.0–0.5)
HCT: 28 % — ABNORMAL LOW (ref 34.8–46.6)
HEMOGLOBIN: 9.2 g/dL — AB (ref 11.6–15.9)
LYMPH#: 1.2 10*3/uL (ref 0.9–3.3)
LYMPH%: 17.5 % (ref 14.0–49.7)
MCH: 30.3 pg (ref 25.1–34.0)
MCHC: 32.9 g/dL (ref 31.5–36.0)
MCV: 92.1 fL (ref 79.5–101.0)
MONO#: 0.5 10*3/uL (ref 0.1–0.9)
MONO%: 7.2 % (ref 0.0–14.0)
NEUT#: 4.8 10*3/uL (ref 1.5–6.5)
NEUT%: 68.8 % (ref 38.4–76.8)
PLATELETS: 200 10*3/uL (ref 145–400)
RBC: 3.04 10*6/uL — ABNORMAL LOW (ref 3.70–5.45)
RDW: 18.9 % — AB (ref 11.2–14.5)
WBC: 7 10*3/uL (ref 3.9–10.3)

## 2015-07-12 NOTE — Telephone Encounter (Signed)
Gave avs & calendar for November.  °

## 2015-07-12 NOTE — Progress Notes (Signed)
[place] Telephone:([phone]   [phone]  OFFICE PROGRESS NOTE  [name], MD [address] [facility] [address] [place] [address]  DIAGNOSIS: Persistent anemia questionable for anemia of chronic disease.  PRIOR THERAPY: Prednisone 5 mg by mouth twice a day for questionable hemolytic anemia in April through June 2015. This was discontinued secondary to adverse effects and no improvement in her condition  CURRENT THERAPY: None  INTERVAL HISTORY: Claudia Lara 81 y.o. female returns to the clinic today for follow-up visit. The patient is feeling fine today except for mild fatigue with exertion. She had a bone marrow biopsy and aspirate performed recently for evaluation of her condition and she is here for discussion of her biopsy results and recommendation regarding treatment of her condition.  She denied having any significant weight loss or night sweats. The patient denied having any chest pain, shortness of breath, cough or hemoptysis. She denied having any nausea or vomiting. She has no bleeding issues.  MEDICAL HISTORY: Past Medical History  Diagnosis Date  . ANEMIA 08/22/2009  . Unspecified essential hypertension 10/19/2007  . CAROTID ARTERY STENOSIS 01/16/2010  . PVD 10/06/2007  . HEMORRHOIDS, INTERNAL     surgery was in the 90's (per patient)  . GEN OSTEOARTHROSIS INVOLVING MULTIPLE SITES 11/11/2008  . NECK PAIN, ACUTE 12/28/2009  . PERIPHERAL EDEMA 10/06/2007  . DYSPNEA ON EXERTION 11/16/2007    ALLERGIES:  is allergic to sulfonamide derivatives.  MEDICATIONS:  Current Outpatient Prescriptions  Medication Sig Dispense Refill  . Ascorbic Acid (VITAMIN C) 500 MG tablet Take 500 mg by mouth every morning.     . Calcium Carbonate-Vit D-Min (CALCIUM 600 + MINERALS) 600-200 MG-UNIT TABS Take 1 tablet by mouth every morning.     . carvedilol (COREG) 12.5 MG tablet TAKE 1 TABLET BY MOUTH 2 TIMES DAILY WITH A MEAL. 360 tablet 1  .  cholecalciferol (VITAMIN D) 1000 UNITS tablet Take 1,000 Units by mouth every morning.     . Glucosamine-Chondroit-Vit C-Mn (GLUCOSAMINE 1500 COMPLEX) CAPS Take 1 capsule by mouth at bedtime.     . meloxicam (MOBIC) 15 MG tablet Take 1 tablet (15 mg total) by mouth daily. 90 tablet 2  . Multiple Vitamins-Minerals (CENTRUM SILVER ADULT 50+) TABS Take 1 tablet by mouth every morning.    [name] omeprazole (PRILOSEC) 20 MG capsule Take 20 mg by mouth every morning.    . Tetrahydrozoline HCl (VISINE OP) Apply 1-2 drops to eye daily as needed (dry eyes.).    [name] torsemide (DEMADEX) 10 MG tablet Take 1 tablet (10 mg total) by mouth daily. (Patient taking differently: Take 5 mg by mouth 3 (three) times a week. ) 90 tablet 1  . valsartan (DIOVAN) 320 MG tablet Take 1 tablet (320 mg total) by mouth daily. 90 tablet 3   No current facility-administered medications for this visit.    SURGICAL HISTORY:  Past Surgical History  Procedure Laterality Date  . Hyperplastic colon polyps, removed  2007    By Dr. Penelope Coop  . Oophorectomy    . Rotator cuff repair  2004    left (Dr. Durward Fortes)  . Abdominal hysterectomy    . Joint replacement  2011    right knee  . Total knee arthroplasty Right     REVIEW OF SYSTEMS:  Constitutional: positive for fatigue Eyes: negative Ears, nose, mouth, throat, and face: negative Respiratory: negative Cardiovascular: negative Gastrointestinal: negative Genitourinary:negative Integument/breast: negative Hematologic/lymphatic: negative Musculoskeletal:negative Neurological: positive for  dizziness Behavioral/Psych: negative Endocrine: negative Allergic/Immunologic: negative   PHYSICAL EXAMINATION: General appearance: alert, cooperative, fatigued and no distress Head: Normocephalic, without obvious abnormality, atraumatic Neck: no adenopathy, no JVD, supple, symmetrical, trachea midline and thyroid not enlarged, symmetric, no tenderness/mass/nodules Lymph nodes: Cervical,  supraclavicular, and axillary nodes normal. Resp: clear to auscultation bilaterally Back: symmetric, no curvature. ROM normal. No CVA tenderness. Cardio: regular rate and rhythm, S1, S2 normal, no murmur, click, rub or gallop GI: soft, non-tender; bowel sounds normal; no masses,  no organomegaly Extremities: extremities normal, atraumatic, no cyanosis or edema Neurologic: Alert and oriented X 3, normal strength and tone. Normal symmetric reflexes. Normal coordination and gait  ECOG PERFORMANCE STATUS: 1 - Symptomatic but completely ambulatory  Blood pressure 162/49, pulse 77, temperature 98.2 F (36.8 C), temperature source Oral, resp. rate 18, height 5' 3.5" (1.613 m), weight 176 lb 3.2 oz (79.924 kg), SpO2 99 %.  LABORATORY DATA: Lab Results  Component Value Date   WBC 7.0 07/12/2015   HGB 9.2* 07/12/2015   HCT 28.0* 07/12/2015   MCV 92.1 07/12/2015   PLT 200 07/12/2015      Chemistry      Component Value Date/Time   NA 141 06/21/2015 1007   NA 138 05/15/2015 1406   K 4.5 06/21/2015 1007   K 4.4 05/15/2015 1406   CL 107 05/15/2015 1406   CO2 23 06/21/2015 1007   CO2 25 05/15/2015 1406   BUN 26.4* 06/21/2015 1007   BUN 30* 05/15/2015 1406   CREATININE 1.3* 06/21/2015 1007   CREATININE 1.29* 05/15/2015 1406      Component Value Date/Time   CALCIUM 8.9 06/21/2015 1007   CALCIUM 9.3 05/15/2015 1406   ALKPHOS 78 06/21/2015 1007   ALKPHOS 76 05/15/2015 1406   AST 18 06/21/2015 1007   AST 16 05/15/2015 1406   ALT 15 06/21/2015 1007   ALT 12 05/15/2015 1406   BILITOT 1.78* 06/21/2015 1007   BILITOT 1.6* 05/15/2015 1406       RADIOGRAPHIC STUDIES: Ct Biopsy  07/05/2015   CLINICAL DATA:  Anemia and need for bone marrow biopsy.  EXAM: CT GUIDED ASPIRATE AND CORE BIOPSY OF RIGHT ILIAC BONE MARROW  ANESTHESIA/SEDATION: Versed 2.0 mg IV, Fentanyl 75 mcg IV  Total Moderate Sedation Time  10 minutes.  PROCEDURE: The procedure risks, benefits, and alternatives were explained  to the patient. Questions regarding the procedure were encouraged and answered. The patient understands and consents to the procedure. A time-out was performed prior to the procedure.  The right gluteal region was prepped with Betadine. Sterile gown and sterile gloves were used for the procedure. Local anesthesia was provided with 1% Lidocaine.  Under CT guidance, an 11 gauge OnControl bone cutting needle was advanced from a posterior approach into the right iliac bone. Needle positioning was confirmed with CT. Initial non heparinized and heparinized aspirate samples were obtained of bone marrow.  Core biopsy was performed through the 11 gauge needle. A single core sample was obtained.  COMPLICATIONS: None  FINDINGS: Inspection of initial aspirate did reveal visible particles. An intact core biopsy sample was obtained.  IMPRESSION: CT guided bone marrow biopsy of right posterior iliac bone with both aspirate and core samples obtained.   Electronically Signed   By: Aletta Edouard M.D.   On: 07/05/2015 13:55   Patient: Claudia Lara, Claudia Lara Collected: 07/05/2015 Client: [facility] Accession: [id number] Received: 07/05/2015 Aletta Edouard DOB: [birth year] Age: 81 Gender: F Reported: 07/06/2015 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 - SEE COMMENT. PERIPHERAL BLOOD: - NORMOCYTIC-NORMOCHROMIC ANEMIA. Diagnosis Note The bone marrow is hypercellular for age with trilineage hematopoiesis, but with relative abundance of erythroid precursors displaying orderly and progressive maturation for the most part. Significant dyspoiesis is not present and no ring sideroblasts are seen. The findings are not considered specific or  diagnostic of a myelodysplastic process, and likely represent a secondary change. The peripheral blood shows normocytic anemia with scattering of microspherocytes that raise the possibility of extravascular hemolysis. Clinical and cytogenetic correlation is recommended. Susanne Greenhouse MD Pathologist, Electronic Signature (Case signed 07/06/2015)  ASSESSMENT AND PLAN: This is a very pleasant 81 years old white female with persistent anemia of unclear etiology questionable for anemia of chronic disease plus/minus mild hemolysis. Her recent bone marrow biopsy and aspirate showed no significant abnormality but it showed hypercellular bone marrow for age with erythroid hyperplasia and the peripheral blood show normocytic anemia with a scattering of Microspherocytes raising suspicious for extravascular hemolysis. I had a lengthy discussion with the patient today about her current condition. I discussed with the patient treating her with a course of prednisone versus close observation and monitoring. The patient is currently asymptomatic except for fatigue with exertion and she is not interested in treatment with prednisone because of the adverse effects and also lack of improvement in her condition. She will continue on observation for now. I will see her back for follow-up visit in 3 months for reevaluation with repeat CBC, comprehensive metabolic panel, LDH and haptoglobin. The patient was advised to call immediately if she has any concerning symptoms in the interval. The patient voices understanding of current disease status and treatment options and is in agreement with the current care plan.  All questions were answered. The patient knows to call the clinic with any problems, questions or concerns. We can certainly see the patient much sooner if necessary.  Disclaimer: This note was dictated with voice recognition software. Similar sounding words can inadvertently be transcribed and may not be  corrected upon review.

## 2015-07-13 LAB — CHROMOSOME ANALYSIS, BONE MARROW

## 2015-07-20 ENCOUNTER — Encounter (HOSPITAL_COMMUNITY): Payer: Self-pay

## 2015-07-28 ENCOUNTER — Encounter: Payer: Self-pay | Admitting: Internal Medicine

## 2015-07-31 ENCOUNTER — Encounter: Payer: Self-pay | Admitting: Internal Medicine

## 2015-08-01 ENCOUNTER — Telehealth: Payer: Self-pay

## 2015-08-01 NOTE — Telephone Encounter (Signed)
LMOVM pt will probably call back

## 2015-08-01 NOTE — Telephone Encounter (Signed)
Called pt regarding My Chart message b/w Dr. Ronnald Ramp and pt. See previous email note if further notes are needed. Calling to schedule pt an apt if willing to come in to answer mychart question.

## 2015-08-03 ENCOUNTER — Telehealth: Payer: Self-pay

## 2015-08-03 ENCOUNTER — Ambulatory Visit (INDEPENDENT_AMBULATORY_CARE_PROVIDER_SITE_OTHER): Payer: Medicare Other | Admitting: Internal Medicine

## 2015-08-03 ENCOUNTER — Other Ambulatory Visit (INDEPENDENT_AMBULATORY_CARE_PROVIDER_SITE_OTHER): Payer: Medicare Other

## 2015-08-03 ENCOUNTER — Encounter: Payer: Self-pay | Admitting: Internal Medicine

## 2015-08-03 VITALS — BP 140/62 | HR 67 | Temp 97.9°F | Resp 16 | Ht 64.0 in | Wt 175.0 lb

## 2015-08-03 DIAGNOSIS — I10 Essential (primary) hypertension: Secondary | ICD-10-CM

## 2015-08-03 DIAGNOSIS — N183 Chronic kidney disease, stage 3 unspecified: Secondary | ICD-10-CM

## 2015-08-03 DIAGNOSIS — K219 Gastro-esophageal reflux disease without esophagitis: Secondary | ICD-10-CM

## 2015-08-03 DIAGNOSIS — K921 Melena: Secondary | ICD-10-CM

## 2015-08-03 DIAGNOSIS — D638 Anemia in other chronic diseases classified elsewhere: Secondary | ICD-10-CM | POA: Diagnosis not present

## 2015-08-03 DIAGNOSIS — Z23 Encounter for immunization: Secondary | ICD-10-CM | POA: Diagnosis not present

## 2015-08-03 LAB — BASIC METABOLIC PANEL
BUN: 37 mg/dL — ABNORMAL HIGH (ref 6–23)
CALCIUM: 9.3 mg/dL (ref 8.4–10.5)
CO2: 26 mEq/L (ref 19–32)
CREATININE: 1.39 mg/dL — AB (ref 0.40–1.20)
Chloride: 107 mEq/L (ref 96–112)
GFR: 38.64 mL/min — ABNORMAL LOW (ref >60.00)
Glucose, Bld: 95 mg/dL (ref 70–99)
Potassium: 4.7 mEq/L (ref 3.5–5.1)
Sodium: 140 mEq/L (ref 135–145)

## 2015-08-03 LAB — CBC WITH DIFFERENTIAL/PLATELET
BASOS ABS: 0 10*3/uL (ref 0.0–0.1)
Basophils Relative: 0.4 % (ref 0.0–3.0)
Eosinophils Absolute: 0.3 10*3/uL (ref 0.0–0.7)
Eosinophils Relative: 4.5 % (ref 0.0–5.0)
HCT: 28.8 % — ABNORMAL LOW (ref 36.0–46.0)
HEMOGLOBIN: 9.8 g/dL — AB (ref 12.0–15.0)
LYMPHS PCT: 14 % (ref 12.0–46.0)
Lymphs Abs: 1 10*3/uL (ref 0.7–4.0)
MCHC: 34.2 g/dL (ref 30.0–36.0)
MCV: 89.6 fl (ref 78.0–100.0)
Monocytes Absolute: 0.7 10*3/uL (ref 0.1–1.0)
Monocytes Relative: 9 % (ref 3.0–12.0)
Neutro Abs: 5.3 10*3/uL (ref 1.4–7.7)
Neutrophils Relative %: 72.1 % (ref 43.0–77.0)
Platelets: 224 10*3/uL (ref 150.0–400.0)
RBC: 3.21 Mil/uL — ABNORMAL LOW (ref 3.87–5.11)
RDW: 19.1 % — ABNORMAL HIGH (ref 11.5–15.5)
WBC: 7.3 10*3/uL (ref 4.0–10.5)

## 2015-08-03 LAB — FECAL OCCULT BLOOD, GUAIAC: Fecal Occult Blood: POSITIVE

## 2015-08-03 MED ORDER — DEXLANSOPRAZOLE 60 MG PO CPDR: 60.0000 mg | DELAYED_RELEASE_CAPSULE | Freq: Every day | ORAL | Status: DC

## 2015-08-03 NOTE — Patient Instructions (Signed)
Gastroesophageal Reflux Disease, Adult Gastroesophageal reflux disease (GERD) happens when acid from your stomach flows up into the esophagus. When acid comes in contact with the esophagus, the acid causes soreness (inflammation) in the esophagus. Over time, GERD may create small holes (ulcers) in the lining of the esophagus. CAUSES   Increased body weight. This puts pressure on the stomach, making acid rise from the stomach into the esophagus.  Smoking. This increases acid production in the stomach.  Drinking alcohol. This causes decreased pressure in the lower esophageal sphincter (valve or ring of muscle between the esophagus and stomach), allowing acid from the stomach into the esophagus.  Late evening meals and a full stomach. This increases pressure and acid production in the stomach.  A malformed lower esophageal sphincter. Sometimes, no cause is found. SYMPTOMS   Burning pain in the lower part of the mid-chest behind the breastbone and in the mid-stomach area. This may occur twice a week or more often.  Trouble swallowing.  Sore throat.  Dry cough.  Asthma-like symptoms including chest tightness, shortness of breath, or wheezing. DIAGNOSIS  Your caregiver may be able to diagnose GERD based on your symptoms. In some cases, X-rays and other tests may be done to check for complications or to check the condition of your stomach and esophagus. TREATMENT  Your caregiver may recommend over-the-counter or prescription medicines to help decrease acid production. Ask your caregiver before starting or adding any new medicines.  HOME CARE INSTRUCTIONS   Change the factors that you can control. Ask your caregiver for guidance concerning weight loss, quitting smoking, and alcohol consumption.  Avoid foods and drinks that make your symptoms worse, such as:  Caffeine or alcoholic drinks.  Chocolate.  Peppermint or mint flavorings.  Garlic and onions.  Spicy foods.  Citrus fruits,  such as oranges, lemons, or limes.  Tomato-based foods such as sauce, chili, salsa, and pizza.  Fried and fatty foods.  Avoid lying down for the 3 hours prior to your bedtime or prior to taking a nap.  Eat small, frequent meals instead of large meals.  Wear loose-fitting clothing. Do not wear anything tight around your waist that causes pressure on your stomach.  Raise the head of your bed 6 to 8 inches with wood blocks to help you sleep. Extra pillows will not help.  Only take over-the-counter or prescription medicines for pain, discomfort, or fever as directed by your caregiver.  Do not take aspirin, ibuprofen, or other nonsteroidal anti-inflammatory drugs (NSAIDs). SEEK IMMEDIATE MEDICAL CARE IF:   You have pain in your arms, neck, jaw, teeth, or back.  Your pain increases or changes in intensity or duration.  You develop nausea, vomiting, or sweating (diaphoresis).  You develop shortness of breath, or you faint.  Your vomit is green, yellow, black, or looks like coffee grounds or blood.  Your stool is red, bloody, or black. These symptoms could be signs of other problems, such as heart disease, gastric bleeding, or esophageal bleeding. MAKE SURE YOU:   Understand these instructions.  Will watch your condition.  Will get help right away if you are not doing well or get worse. Document Released: 08/21/2005 Document Revised: 02/03/2012 Document Reviewed: 05/31/2011 Exit[employer] Patient Information 2015 [employer]. This information is not intended to replace advice given to you by your health care provider. Make sure you discuss any questions you have with your health care provider.  

## 2015-08-03 NOTE — Progress Notes (Signed)
Pre visit review using our clinic review tool, if applicable. No additional management support is needed unless otherwise documented below in the visit note. 

## 2015-08-03 NOTE — Telephone Encounter (Signed)
Pt has been scheduled for 08/25/15 1030 am [facility] is notifying Dr Ronnald Ramp

## 2015-08-03 NOTE — Progress Notes (Signed)
Subjective:  Patient ID: Claudia Lara, female    DOB: 1932-05-01  Age: 81 y.o. MRN: [id number]  CC: Anemia   HPI TESSIE ORDAZ presents for the complaint of a few black stools over the last few weeks. She noticed black stools about 2 weeks ago but now for 3 days her stools have been normal. She complains of fatigue but no abdominal pain, heartburn, dysphagia, odynophagia, nausea, vomiting, or loss of appetite. There is been no bright red blood per rectum. She does take an anti-inflammatory and suffers from chronic anemia. She is on low-dose proton pump inhibitor therapy for gastroesophageal reflux disease.  Outpatient Prescriptions Prior to Visit  Medication Sig Dispense Refill  . Ascorbic Acid (VITAMIN C) 500 MG tablet Take 500 mg by mouth every morning.     . Calcium Carbonate-Vit D-Min (CALCIUM 600 + MINERALS) 600-200 MG-UNIT TABS Take 1 tablet by mouth every morning.     . carvedilol (COREG) 12.5 MG tablet TAKE 1 TABLET BY MOUTH 2 TIMES DAILY WITH A MEAL. 360 tablet 1  . cholecalciferol (VITAMIN D) 1000 UNITS tablet Take 1,000 Units by mouth every morning.     . Glucosamine-Chondroit-Vit C-Mn (GLUCOSAMINE 1500 COMPLEX) CAPS Take 1 capsule by mouth at bedtime.     . Multiple Vitamins-Minerals (CENTRUM SILVER ADULT 50+) TABS Take 1 tablet by mouth every morning.    . Tetrahydrozoline HCl (VISINE OP) Apply 1-2 drops to eye daily as needed (dry eyes.).    [name] torsemide (DEMADEX) 10 MG tablet Take 1 tablet (10 mg total) by mouth daily. (Patient taking differently: Take 5 mg by mouth 3 (three) times a week. ) 90 tablet 1  . valsartan (DIOVAN) 320 MG tablet Take 1 tablet (320 mg total) by mouth daily. 90 tablet 3  . meloxicam (MOBIC) 15 MG tablet Take 1 tablet (15 mg total) by mouth daily. 90 tablet 2  . omeprazole (PRILOSEC) 20 MG capsule Take 20 mg by mouth every morning.     No facility-administered medications prior to visit.    ROS Review of Systems  Constitutional: Positive for  fatigue. Negative for fever, chills, diaphoresis, activity change, appetite change and unexpected weight change.  HENT: Negative.  Negative for congestion, sinus pressure, tinnitus, trouble swallowing and voice change.   Eyes: Negative.   Respiratory: Negative.  Negative for cough, choking, chest tightness, shortness of breath and stridor.   Cardiovascular: Negative.  Negative for palpitations and leg swelling.  Gastrointestinal: Negative.  Negative for nausea, vomiting, abdominal pain, diarrhea, constipation, blood in stool, abdominal distention, anal bleeding and rectal pain.  Endocrine: Negative.   Genitourinary: Negative.  Negative for difficulty urinating.  Musculoskeletal: Negative.   Skin: Negative.   Allergic/Immunologic: Negative.   Neurological: Negative.   Hematological: Negative.  Negative for adenopathy. Does not bruise/bleed easily.  Psychiatric/Behavioral: Negative.     Objective:  BP 140/62 mmHg  Pulse 67  Temp(Src) 97.9 F (36.6 C) (Oral)  Resp 16  Ht _0  (1.626 m)  Wt 175 lb (79.379 kg)  BMI 30.02 kg/m2  SpO2 98%  BP Readings from Last 3 Encounters:  08/03/15 140/62  07/12/15 162/49  07/05/15 175/50    Wt Readings from Last 3 Encounters:  08/03/15 175 lb (79.379 kg)  07/12/15 176 lb 3.2 oz (79.924 kg)  06/21/15 175 lb 11.2 oz (79.697 kg)    Physical Exam  Constitutional: She is oriented to person, place, and time.  Non-toxic appearance. She does not have a sickly appearance. She does  not appear ill. No distress.  HENT:  Nose: Nose normal.  Mouth/Throat: Oropharynx is clear and moist. No oropharyngeal exudate.  Eyes: Conjunctivae are normal. Right eye exhibits no discharge. Left eye exhibits no discharge. No scleral icterus.  Neck: Normal range of motion. Neck supple. No JVD present. No tracheal deviation present. No thyromegaly present.  Cardiovascular: Normal rate, regular rhythm, normal heart sounds and intact distal pulses.  Exam reveals no gallop  and no friction rub.   No murmur heard. Pulmonary/Chest: Effort normal and breath sounds normal. No stridor. No respiratory distress. She has no wheezes. She has no rales. She exhibits no tenderness.  Abdominal: Soft. Bowel sounds are normal. She exhibits no distension and no mass. There is no tenderness. There is no rebound and no guarding.  Genitourinary: Rectal exam shows no external hemorrhoid, no internal hemorrhoid, no fissure, no mass, no tenderness and anal tone normal. Guaiac positive stool.  Musculoskeletal: Normal range of motion. She exhibits no edema or tenderness.  Lymphadenopathy:    She has no cervical adenopathy.  Neurological: She is oriented to person, place, and time.  Skin: Skin is warm and dry. No rash noted. She is not diaphoretic. No erythema. No pallor.  Psychiatric: She has a normal mood and affect. Her behavior is normal. Judgment and thought content normal.  Vitals reviewed.   Lab Results  Component Value Date   WBC 7.3 08/03/2015   HGB 9.8* 08/03/2015   HCT 28.8* 08/03/2015   PLT 224.0 08/03/2015   GLUCOSE 95 08/03/2015   CHOL 136 05/15/2015   TRIG 195.0* 05/15/2015   HDL 48.30 05/15/2015   LDLCALC 49 05/15/2015   ALT 12 07/12/2015   AST 15 07/12/2015   NA 140 08/03/2015   K 4.7 08/03/2015   CL 107 08/03/2015   CREATININE 1.39* 08/03/2015   BUN 37* 08/03/2015   CO2 26 08/03/2015   TSH 0.88 05/15/2015   INR 1.17 07/05/2015   HGBA1C 5.1 12/02/2011    Ct Biopsy  07/05/2015   CLINICAL DATA:  Anemia and need for bone marrow biopsy.  EXAM: CT GUIDED ASPIRATE AND CORE BIOPSY OF RIGHT ILIAC BONE MARROW  ANESTHESIA/SEDATION: Versed 2.0 mg IV, Fentanyl 75 mcg IV  Total Moderate Sedation Time  10 minutes.  PROCEDURE: The procedure risks, benefits, and alternatives were explained to the patient. Questions regarding the procedure were encouraged and answered. The patient understands and consents to the procedure. A time-out was performed prior to the procedure.   The right gluteal region was prepped with Betadine. Sterile gown and sterile gloves were used for the procedure. Local anesthesia was provided with 1% Lidocaine.  Under CT guidance, an 11 gauge OnControl bone cutting needle was advanced from a posterior approach into the right iliac bone. Needle positioning was confirmed with CT. Initial non heparinized and heparinized aspirate samples were obtained of bone marrow.  Core biopsy was performed through the 11 gauge needle. A single core sample was obtained.  COMPLICATIONS: None  FINDINGS: Inspection of initial aspirate did reveal visible particles. An intact core biopsy sample was obtained.  IMPRESSION: CT guided bone marrow biopsy of right posterior iliac bone with both aspirate and core samples obtained.   Electronically Signed   By: Aletta Edouard M.D.   On: 07/05/2015 13:55    Assessment & Plan:   Maddalynn was seen today for anemia.  Diagnoses and all orders for this visit:  Need for influenza vaccination -     Flu vaccine HIGH DOSE PF  Melena- she reports a history of melena and has a faintly heme positive stool today. I've asked her to stop taking the anti-inflammatory. I will increase the efficacy of her proton pump inhibitor by changing her from omeprazole to dexilant. Her anemia has actually improved so I don't think she's had any significant blood loss. I've asked her to see GI to consider having endoscopy to find the source of the bleeding. -     CBC with Differential/Platelet; Future -     dexlansoprazole (DEXILANT) 60 MG capsule; Take 1 capsule (60 mg total) by mouth daily. -     Ambulatory referral to Gastroenterology  Essential hypertension- her blood pressure is well-controlled, electrolytes and renal function are stable. -     Basic metabolic panel; Future  Kidney disease, chronic, stage III (GFR 30-59 ml/min)- her renal function is stable. -     Basic metabolic panel; Future -     CBC with Differential/Platelet; Future  Anemia  of chronic disease- improvement noted, she continues to follow-up with hematology. -     CBC with Differential/Platelet; Future  Gastroesophageal reflux disease without esophagitis- will change omeprazole to dexilant   I have discontinued Ms. Mullan meloxicam and omeprazole. I am also having her start on dexlansoprazole. Additionally, I am having her maintain her CALCIUM 600 + MINERALS, GLUCOSAMINE 1500 COMPLEX, vitamin C, cholecalciferol, carvedilol, valsartan, torsemide, CENTRUM SILVER ADULT 50+, and Tetrahydrozoline HCl (VISINE OP).  Meds ordered this encounter  Medications  . dexlansoprazole (DEXILANT) 60 MG capsule    Sig: Take 1 capsule (60 mg total) by mouth daily.    Dispense:  30 capsule    Refill:  11     Follow-up: Return in about 6 weeks (around 09/14/2015).  [name], MD

## 2015-08-03 NOTE — Telephone Encounter (Signed)
Claudia Lara,         Urgent Referral from Dr. Ronnald Ramp for Melena - pt was last seen by Dr. Ardis Hughs in 2010. Advise on scheduling since Dr. Ardis Hughs has nothing until November and APP's are scheduling for late September.

## 2015-08-07 ENCOUNTER — Encounter: Payer: Self-pay | Admitting: Internal Medicine

## 2015-08-09 NOTE — Telephone Encounter (Signed)
Patient is calling to follow up on her [place] from 08/07/15. She is still awaiting a response. Please respond to patient via MyChart ASAP.

## 2015-08-10 ENCOUNTER — Other Ambulatory Visit: Payer: Self-pay | Admitting: Internal Medicine

## 2015-08-10 DIAGNOSIS — M159 Polyosteoarthritis, unspecified: Secondary | ICD-10-CM

## 2015-08-10 MED ORDER — TRAMADOL HCL 50 MG PO TABS: 50.0000 mg | ORAL_TABLET | Freq: Three times a day (TID) | ORAL | Status: DC | PRN

## 2015-08-25 ENCOUNTER — Encounter: Payer: Self-pay | Admitting: Gastroenterology

## 2015-08-25 ENCOUNTER — Ambulatory Visit (INDEPENDENT_AMBULATORY_CARE_PROVIDER_SITE_OTHER): Payer: Medicare Other | Admitting: Gastroenterology

## 2015-08-25 VITALS — BP 172/60 | HR 80 | Ht 63.5 in | Wt 173.6 lb

## 2015-08-25 DIAGNOSIS — D649 Anemia, unspecified: Secondary | ICD-10-CM | POA: Diagnosis not present

## 2015-08-25 NOTE — Progress Notes (Signed)
HPI: This is a   very pleasant 81 year old woman   who was referred to me by Janith Lima, MD  to evaluate  dark Hemoccult-positive stools .    Chief complaint is dark Hemoccult-positive stools  colonoscopy in 2007 by Dr. Acquanetta Sit. he found several hyperplastic polyps, no adenomatous polyps. She was told to come back in 10 years for repeat routine colonoscopy.  EGD Dr. Ardis Hughs 2010 for anemia 1) Mild gastritis, biopsied to check for H. Pylori 2) Hiatal hernia, 3cm 3) Esophagitis, mild, reflux related 4) Otherwise normal examination, biopsies taken from duodenum to check for sprue; biopsies showed no Celiac Sprue, no h. Pylori.  Recent note from her heme/onc MD: persistent anemia of unclear etiology questionable for anemia of chronic disease plus/minus mild hemolysis. Her recent bone marrow biopsy and aspirate showed no significant abnormality but it showed hypercellular bone marrow for age with erythroid hyperplasia and the peripheral blood show normocytic anemia with a scattering of Microspherocytes raising suspicious for extravascular hemolysis  Heme+ stool last month  She saw black stools 5-6 days in a row;  This was last month.  Soft consistency but very dark.  She has had no abdominal pains, no nausea or vomiting. She was taking meloxicam on a daily basis for arthritis pains. This helps significantly with arthritis. The meloxicam was stopped 3 weeks ago after she presented with a dark stools were primary care's office.  Review of systems: Pertinent positive and negative review of systems were noted in the above HPI section. Complete review of systems was performed and was otherwise normal.   Past Medical History  Diagnosis Date  . ANEMIA 08/22/2009  . Unspecified essential hypertension 10/19/2007  . CAROTID ARTERY STENOSIS 01/16/2010  . PVD 10/06/2007  . HEMORRHOIDS, INTERNAL     surgery was in the 90's (per patient)  . GEN OSTEOARTHROSIS INVOLVING MULTIPLE SITES 11/11/2008  .  NECK PAIN, ACUTE 12/28/2009  . PERIPHERAL EDEMA 10/06/2007  . DYSPNEA ON EXERTION 11/16/2007    Past Surgical History  Procedure Laterality Date  . Hyperplastic colon polyps, removed  2007    By Dr. Penelope Coop  . Oophorectomy    . Rotator cuff repair  2004    left (Dr. Durward Fortes)  . Abdominal hysterectomy    . Joint replacement  2011    right knee  . Total knee arthroplasty Right     Current Outpatient Prescriptions  Medication Sig Dispense Refill  . Ascorbic Acid (VITAMIN C) 500 MG tablet Take 500 mg by mouth every morning.     . Calcium Carbonate-Vit D-Min (CALCIUM 600 + MINERALS) 600-200 MG-UNIT TABS Take 1 tablet by mouth every morning.     . carvedilol (COREG) 12.5 MG tablet TAKE 1 TABLET BY MOUTH 2 TIMES DAILY WITH A MEAL. 360 tablet 1  . cholecalciferol (VITAMIN D) 1000 UNITS tablet Take 1,000 Units by mouth every morning.     [name] dexlansoprazole (DEXILANT) 60 MG capsule Take 1 capsule (60 mg total) by mouth daily. 30 capsule 11  . Glucosamine-Chondroit-Vit C-Mn (GLUCOSAMINE 1500 COMPLEX) CAPS Take 1 capsule by mouth at bedtime.     . Multiple Vitamins-Minerals (CENTRUM SILVER ADULT 50+) TABS Take 1 tablet by mouth every morning.    . Tetrahydrozoline HCl (VISINE OP) Apply 1-2 drops to eye daily as needed (dry eyes.).    [name] torsemide (DEMADEX) 10 MG tablet Take 1 tablet (10 mg total) by mouth daily. (Patient taking differently: Take 5 mg by mouth 3 (three) times a week. )  90 tablet 1  . traMADol (ULTRAM) 50 MG tablet Take 1 tablet (50 mg total) by mouth every 8 (eight) hours as needed. 75 tablet 5  . valsartan (DIOVAN) 320 MG tablet Take 1 tablet (320 mg total) by mouth daily. 90 tablet 3   No current facility-administered medications for this visit.    Allergies as of 08/25/2015 - Review Complete 08/25/2015  Allergen Reaction Noted  . Sulfonamide derivatives      Family History  Problem Relation Age of Onset  . Ovarian cancer Mother   . Cancer Mother   . Leukemia Father   .  Lung cancer Father   . Cancer Father   . Diabetes Neg Hx   . Heart disease Neg Hx   . Hypertension Neg Hx   . Cancer Brother     Social History   Social History  . Marital Status: Divorced    Spouse Name: N/A  . Number of Children: 1  . Years of Education: 12   Occupational History  . Network engineer     retired   Social History Main Topics  . Smoking status: Never Smoker   . Smokeless tobacco: Never Used  . Alcohol Use: No  . Drug Use: No  . Sexual Activity: Not Currently   Other Topics Concern  . Not on file   Social History Narrative   HSG. Married - divorced for many years.  Retired. Lives alone - very active and independent.     Physical Exam: BP 172/60 mmHg  Pulse 80  Ht 5' 3.5" (1.613 m)  Wt 173 lb 9.6 oz (78.744 kg)  BMI 30.27 kg/m2 Constitutional: generally well-appearing Psychiatric: alert and oriented x3 Eyes: extraocular movements intact Mouth: oral pharynx moist, no lesions Neck: supple no lymphadenopathy Cardiovascular: heart regular rate and rhythm Lungs: clear to auscultation bilaterally Abdomen: soft, nontender, nondistended, no obvious ascites, no peritoneal signs, normal bowel sounds Extremities: no lower extremity edema bilaterally Skin: no lesions on visible extremities   Assessment and plan: 81 y.o. female with  dark Hemoccult-positive stool, chronic anemia  She has not had colonoscopy in 9-10 years, upper endoscopy in 7 years. She has new dark Hemoccult-positive stool and chronic anemia. She is obviously not having significant overt bleeding since her hemoglobin actually was up recently area I agree with stopping her NSAID for now. I recommended we proceed with colonoscopy and also upper endoscopy at her soonest convenience to check for significant underlying cause such as neoplasm, peptic ulcer disease.   Owens Loffler, MD [facility] 08/25/2015, 10:35 AM  Cc: Janith Lima, MD

## 2015-08-25 NOTE — Patient Instructions (Addendum)
You will be set up for a colonoscopy.  You will be set up for an upper endoscopy (both at [facility] for heme + anemia).  You have been scheduled for an endoscopy and colonoscopy. Please follow the written instructions given to you at your visit today. Please pick up your prep supplies at the pharmacy within the next 1-3 days. If you use inhalers (even only as needed), please bring them with you on the day of your procedure.

## 2015-08-28 ENCOUNTER — Telehealth: Payer: Self-pay | Admitting: Gastroenterology

## 2015-08-29 ENCOUNTER — Telehealth: Payer: Self-pay | Admitting: Gastroenterology

## 2015-08-29 ENCOUNTER — Other Ambulatory Visit: Payer: Self-pay

## 2015-08-29 MED ORDER — NA SULFATE-K SULFATE-MG SULF 17.5-3.13-1.6 GM/177ML PO SOLN: ORAL | Status: DC

## 2015-08-29 MED ORDER — MOVIPREP 100 G PO SOLR: 1.0000 | Freq: Once | ORAL | Status: DC

## 2015-08-29 NOTE — Telephone Encounter (Signed)
Pt has been notified that she can take suprep since she has already picked it up.  Pt agreed and will call with any other concerns

## 2015-09-11 DIAGNOSIS — M25551 Pain in right hip: Secondary | ICD-10-CM | POA: Diagnosis not present

## 2015-09-11 DIAGNOSIS — M1611 Unilateral primary osteoarthritis, right hip: Secondary | ICD-10-CM | POA: Diagnosis not present

## 2015-09-11 DIAGNOSIS — M1711 Unilateral primary osteoarthritis, right knee: Secondary | ICD-10-CM | POA: Diagnosis not present

## 2015-09-11 NOTE — Telephone Encounter (Signed)
Prescription sent to Rx. See other phone note on 08-29-15.

## 2015-09-14 ENCOUNTER — Encounter: Payer: Self-pay | Admitting: Internal Medicine

## 2015-09-14 DIAGNOSIS — Z1231 Encounter for screening mammogram for malignant neoplasm of breast: Secondary | ICD-10-CM | POA: Diagnosis not present

## 2015-09-14 LAB — HM MAMMOGRAPHY

## 2015-09-14 MED ORDER — PANTOPRAZOLE SODIUM 40 MG PO TBEC: 40.0000 mg | DELAYED_RELEASE_TABLET | Freq: Every day | ORAL | Status: DC

## 2015-09-20 DIAGNOSIS — M4316 Spondylolisthesis, lumbar region: Secondary | ICD-10-CM | POA: Diagnosis not present

## 2015-09-20 DIAGNOSIS — M47816 Spondylosis without myelopathy or radiculopathy, lumbar region: Secondary | ICD-10-CM | POA: Diagnosis not present

## 2015-09-20 DIAGNOSIS — M1611 Unilateral primary osteoarthritis, right hip: Secondary | ICD-10-CM | POA: Diagnosis not present

## 2015-09-21 ENCOUNTER — Telehealth: Payer: Self-pay | Admitting: Internal Medicine

## 2015-09-21 NOTE — Telephone Encounter (Signed)
pt called to cx appt due to procedure.Marland KitchenMarland Kitchenpt will call back to r/s

## 2015-09-25 DIAGNOSIS — M545 Low back pain: Secondary | ICD-10-CM | POA: Diagnosis not present

## 2015-09-25 DIAGNOSIS — M47816 Spondylosis without myelopathy or radiculopathy, lumbar region: Secondary | ICD-10-CM | POA: Diagnosis not present

## 2015-10-02 ENCOUNTER — Encounter: Payer: Self-pay | Admitting: Cardiology

## 2015-10-02 ENCOUNTER — Ambulatory Visit (INDEPENDENT_AMBULATORY_CARE_PROVIDER_SITE_OTHER): Payer: Medicare Other | Admitting: Cardiology

## 2015-10-02 VITALS — BP 130/60 | HR 65 | Ht 64.0 in | Wt 169.8 lb

## 2015-10-02 DIAGNOSIS — I350 Nonrheumatic aortic (valve) stenosis: Secondary | ICD-10-CM

## 2015-10-02 NOTE — Patient Instructions (Signed)
Medication Instructions:  Your physician recommends that you continue on your current medications as directed. Please refer to the Current Medication list given to you today.  Labwork: none  Testing/Procedures: none  Follow-Up: Your physician wants you to follow-up in: 1 year ov/ekg with Dr Theressa Stamps will receive a reminder letter in the mail two months in advance. If you don't receive a letter, please call our office to schedule the follow-up appointment.  If you need a refill on your cardiac medications before your next appointment, please call your pharmacy.

## 2015-10-02 NOTE — Progress Notes (Signed)
Cardiology Office Note   Date:  10/03/2015   ID:  Claudia Lara, Claudia Lara 07/20/1932, MRN [id number]  PCP:  [name], MD  Cardiologist: Darlin Coco MD  Chief Complaint  Patient presents with  . Aortic Stenosis      History of Present Illness: Claudia Lara is a 81 y.o. female who presents for a one-year follow-up visit History of Present Illness: This 81 year old divorced Caucasian female is seen for a one-year followup visit her PCP is Dr. [name]. She is being seen because of exertional dyspnea. She does not have any history of known ischemic heart disease. She had a normal nuclear stress test in 2007. In 2009 she had an echocardiogram showing normal left ventricular systolic function and mild mitral regurgitation and no significant aortic valve disease. She had an echocardiogram on 06/30/13 and showed normal left ventricular systolic function with an ejection fraction of 60-65% and there was grade 1 diastolic dysfunction. She had evidence of mild aortic stenosis at that time with peak aortic gradient of 23 and a mean gradient of 14. There was also mild to moderate mitral regurgitation.  She had a follow-up echocardiogram 09/29/14 which showed an ejection fraction of 55-60% with grade 1 diastolic dysfunction and a mean gradient of 14 across her aortic valve. The patient had a Myoview stress test on 07/02/13 showing normal left ventricular function and no evidence of ischemia. She does have a past history of high blood pressure. She is a nonsmoker. She is not diabetic. She denies any history of hypercholesterolemia.  Since last visit she has not been experiencing any new cardiac symptoms.  She has had some GI bleeding which may have been related to previous use of nonsteroidal anti-inflammatory agents meloxicam.  She is scheduled for a colonoscopy next week with Dr. Ardis Hughs.   Past Medical History  Diagnosis Date  . ANEMIA 08/22/2009  . Unspecified essential hypertension  10/19/2007  . CAROTID ARTERY STENOSIS 01/16/2010  . PVD 10/06/2007  . HEMORRHOIDS, INTERNAL     surgery was in the 90's (per patient)  . GEN OSTEOARTHROSIS INVOLVING MULTIPLE SITES 11/11/2008  . NECK PAIN, ACUTE 12/28/2009  . PERIPHERAL EDEMA 10/06/2007  . DYSPNEA ON EXERTION 11/16/2007    Past Surgical History  Procedure Laterality Date  . Hyperplastic colon polyps, removed  2007    By Dr. Penelope Coop  . Oophorectomy    . Rotator cuff repair  2004    left (Dr. Durward Fortes)  . Abdominal hysterectomy    . Joint replacement  2011    right knee  . Total knee arthroplasty Right      Current Outpatient Prescriptions  Medication Sig Dispense Refill  . Ascorbic Acid (VITAMIN C) 500 MG tablet Take 500 mg by mouth every morning.     . Calcium Carbonate-Vit D-Min (CALCIUM 600 + MINERALS) 600-200 MG-UNIT TABS Take 1 tablet by mouth every morning.     . carvedilol (COREG) 12.5 MG tablet TAKE 1 TABLET BY MOUTH 2 TIMES DAILY WITH A MEAL. 360 tablet 1  . cholecalciferol (VITAMIN D) 1000 UNITS tablet Take 1,000 Units by mouth every morning.     . Glucosamine-Chondroit-Vit C-Mn (GLUCOSAMINE 1500 COMPLEX) CAPS Take 1 capsule by mouth at bedtime.     . Multiple Vitamins-Minerals (CENTRUM SILVER ADULT 50+) TABS Take 1 tablet by mouth every morning.    . Na Sulfate-K Sulfate-Mg Sulf SOLN Suprep - use as directed 354 mL 0  . pantoprazole (PROTONIX) 40 MG tablet Take 1 tablet (  40 mg total) by mouth daily. 90 tablet 3  . Tetrahydrozoline HCl (VISINE OP) Place 1-2 drops into both eyes daily as needed (dry eyes.).     [name] torsemide (DEMADEX) 10 MG tablet Take 10 mg by mouth daily as needed (edema).    . valsartan (DIOVAN) 320 MG tablet Take 1 tablet (320 mg total) by mouth daily. 90 tablet 3   No current facility-administered medications for this visit.    Allergies:   Sulfonamide derivatives    Social History:  The patient  reports that she has never smoked. She has never used smokeless tobacco. She reports  that she does not drink alcohol or use illicit drugs.   Family History:  The patient's family history includes Cancer in her brother, father, and mother; Leukemia in her father; Lung cancer in her father; Ovarian cancer in her mother. There is no history of Diabetes, Heart disease, or Hypertension.    ROS:  Please see the history of present illness.   Otherwise, review of systems are positive for none.   All other systems are reviewed and negative.    PHYSICAL EXAM: VS:  BP 130/60 mmHg  Pulse 65  Ht 5\' 4"  (1.626 m)  Wt 169 lb 12.8 oz (77.021 kg)  BMI 29.13 kg/m2 , BMI Body mass index is 29.13 kg/(m^2). GEN: Well nourished, well developed, in no acute distress HEENT: normal Neck: no JVD, carotid bruits, or masses Cardiac: RRR; there is a soft systolic ejection murmur at the base.  No, rubs, or gallops,no edema  Respiratory:  clear to auscultation bilaterally, normal work of breathing GI: soft, nontender, nondistended, + BS MS: no deformity or atrophy.  She has prominent Heberden's nodes Skin: warm and dry, no rash Neuro:  Strength and sensation are intact Psych: euthymic mood, full affect   EKG:  EKG is ordered today. The ekg ordered today demonstrates normal sinus rhythm.  Within normal limits.  There is no LVH or strain.   Recent Labs: 05/15/2015: TSH 0.88 07/12/2015: ALT 12 08/03/2015: BUN 37*; Creatinine, Ser 1.39*; Hemoglobin 9.8*; Platelets 224.0; Potassium 4.7; Sodium 140    Lipid Panel    Component Value Date/Time   CHOL 136 05/15/2015 1406   TRIG 195.0* 05/15/2015 1406   HDL 48.30 05/15/2015 1406   CHOLHDL 3 05/15/2015 1406   VLDL 39.0 05/15/2015 1406   LDLCALC 49 05/15/2015 1406      Wt Readings from Last 3 Encounters:  10/02/15 169 lb 12.8 oz (77.021 kg)  08/25/15 173 lb 9.6 oz (78.744 kg)  08/03/15 175 lb (79.379 kg)          ASSESSMENT AND PLAN: 1. aortic stenosis, mild by echo was in 2014 and 2015. 2. dyspnea on exertion, stable 3 essential  hypertension, controlled   Current medicines are reviewed at length with the patient today.  The patient does not have concerns regarding medicines.  The following changes have been made:  no change  Labs/ tests ordered today include:   Orders Placed This Encounter  Procedures  . EKG 12-Lead    Disposition: Continue on current medication.  Recheck in one year for follow-up office visit and EKG.  Berna Spare MD 10/03/2015 12:03 PM    [address], [place], [place]  [address] Phone: [phone]; Fax: [phone]

## 2015-10-05 ENCOUNTER — Encounter: Payer: Self-pay | Admitting: Internal Medicine

## 2015-10-05 DIAGNOSIS — H04123 Dry eye syndrome of bilateral lacrimal glands: Secondary | ICD-10-CM | POA: Diagnosis not present

## 2015-10-05 DIAGNOSIS — H2513 Age-related nuclear cataract, bilateral: Secondary | ICD-10-CM | POA: Diagnosis not present

## 2015-10-09 ENCOUNTER — Telehealth: Payer: Self-pay | Admitting: Gastroenterology

## 2015-10-09 NOTE — Telephone Encounter (Signed)
Went over prep instructions with pt for procedure tomorrow. Pt was switched from suprep to moviprep and didn't get new instructions on how to mix/take moviprep.

## 2015-10-10 ENCOUNTER — Ambulatory Visit: Payer: Medicare Other | Admitting: Internal Medicine

## 2015-10-10 ENCOUNTER — Encounter: Payer: Self-pay | Admitting: Gastroenterology

## 2015-10-10 ENCOUNTER — Other Ambulatory Visit: Payer: Medicare Other

## 2015-10-10 ENCOUNTER — Ambulatory Visit (AMBULATORY_SURGERY_CENTER): Payer: Medicare Other | Admitting: Gastroenterology

## 2015-10-10 VITALS — BP 116/59 | HR 70 | Temp 97.6°F | Resp 23 | Ht 63.0 in | Wt 173.0 lb

## 2015-10-10 DIAGNOSIS — D123 Benign neoplasm of transverse colon: Secondary | ICD-10-CM | POA: Diagnosis not present

## 2015-10-10 DIAGNOSIS — D122 Benign neoplasm of ascending colon: Secondary | ICD-10-CM

## 2015-10-10 DIAGNOSIS — K2990 Gastroduodenitis, unspecified, without bleeding: Secondary | ICD-10-CM | POA: Diagnosis not present

## 2015-10-10 DIAGNOSIS — K2970 Gastritis, unspecified, without bleeding: Secondary | ICD-10-CM

## 2015-10-10 DIAGNOSIS — K2950 Unspecified chronic gastritis without bleeding: Secondary | ICD-10-CM | POA: Diagnosis not present

## 2015-10-10 DIAGNOSIS — D649 Anemia, unspecified: Secondary | ICD-10-CM

## 2015-10-10 DIAGNOSIS — R195 Other fecal abnormalities: Secondary | ICD-10-CM | POA: Diagnosis not present

## 2015-10-10 MED ORDER — SODIUM CHLORIDE 0.9 % IV SOLN
500.0000 mL | INTRAVENOUS | Status: DC
Start: 2015-10-10 — End: 2015-10-10

## 2015-10-10 NOTE — Op Note (Signed)
[place]  [address]. [place], [address]   COLONOSCOPY PROCEDURE REPORT  PATIENT: Claudia Lara, Claudia Lara  MR#: [id number] BIRTHDATE: 1932/02/17 , 83  yrs. old GENDER: female ENDOSCOPIST: [name], MD REFERRED [id number] Evalina Field, M.D. PROCEDURE DATE:  10/10/2015 PROCEDURE:   Colonoscopy, diagnostic and Colonoscopy with snare polypectomy First Screening Colonoscopy - Avg.  risk and is 50 yrs.  old or older - No.  Prior Negative Screening - Now for repeat screening. N/A  History of Adenoma - Now for follow-up colonoscopy & has been > or = to 3 yrs.  N/A  Polyps removed today? Yes ASA CLASS:   Class II INDICATIONS:heme + anemia. MEDICATIONS: Monitored anesthesia care and Propofol 250 mg IV  DESCRIPTION OF PROCEDURE:   After the risks benefits and alternatives of the procedure were thoroughly explained, informed consent was obtained.  The digital rectal exam revealed no abnormalities of the rectum.   The LB [id number]  endoscope was introduced through the anus and advanced to the cecum, which was identified by both the appendix and ileocecal valve. No adverse events experienced.   The quality of the prep was excellent.  The instrument was then slowly withdrawn as the colon was fully examined. Estimated blood loss is zero unless otherwise noted in this procedure report.   COLON FINDINGS: Four sessile polyps ranging between 3-10mm in size were found in the transverse colon and ascending colon. Polypectomies were performed with a cold snare.  The resection was complete, the polyp tissue was completely retrieved and sent to histology.  Retroflexed views revealed no abnormalities. The time to cecum = 3.0 Withdrawal time = 11.6   The scope was withdrawn and the procedure completed. COMPLICATIONS: There were no immediate complications.  ENDOSCOPIC IMPRESSION: Four sessile polyps ranging between 3-36mm in size were found in the transverse colon and ascending  colon; polypectomies were performed with a cold snare  RECOMMENDATIONS: You will receive a letter within 1-2 weeks with the results of your biopsy as well as final recommendations.  Please call my office if you have not received a letter after 3 weeks. Will proceed with EGD now.  eSigned:  [name], MD 10/10/2015 2:33 PM

## 2015-10-10 NOTE — Progress Notes (Signed)
Patient awakening,vss,report to rn 

## 2015-10-10 NOTE — Patient Instructions (Signed)

## 2015-10-10 NOTE — Progress Notes (Signed)
Called to room to assist during endoscopic procedure.  Patient ID and intended procedure confirmed with present staff. Received instructions for my participation in the procedure from the performing physician.  

## 2015-10-10 NOTE — Op Note (Signed)
[place]  [address]. [place], [address]   ENDOSCOPY PROCEDURE REPORT  PATIENT: Claudia, Lara  MR#: [id number] BIRTHDATE: [birth year] , 83  yrs. old GENDER: female ENDOSCOPIST: [name], MD PROCEDURE DATE:  10/10/2015 PROCEDURE:  EGD w/ biopsy ASA CLASS:     Class II INDICATIONS:  FOBT + anemia. MEDICATIONS: Residual sedation present, Monitored anesthesia care, and Propofol 100 mg IV TOPICAL ANESTHETIC: none  DESCRIPTION OF PROCEDURE: After the risks benefits and alternatives of the procedure were thoroughly explained, informed consent was obtained.  The LB [id number] endoscope was introduced through the mouth and advanced to the second portion of the duodenum , Without limitations.  The instrument was slowly withdrawn as the mucosa was fully examined.  There was a medium sized (4cm) hiatal hernia.  There was mild, non-specific pan gastritis.  The antrum and body were biopsied, sent to pathology.  The examination was otherwise normal. Retroflexed views revealed no abnormalities.     The scope was then withdrawn from the patient and the procedure completed. COMPLICATIONS: There were no immediate complications.  ENDOSCOPIC IMPRESSION: There was a medium sized (4cm) hiatal hernia.  There was mild, non-specific pan gastritis.  The antrum and body were biopsied, sent to pathology.  The examination was otherwise normal  RECOMMENDATIONS: Await final pathology.  If negative for H.  pylori will likely follow your blood counts with repeat CBC in 2 months.  eSigned:  [name], MD 10/10/2015 2:37 PM

## 2015-10-11 ENCOUNTER — Telehealth: Payer: Self-pay | Admitting: *Deleted

## 2015-10-11 NOTE — Telephone Encounter (Signed)
  Follow up Call-  Call back number 10/10/2015  Post procedure Call Back phone  # [phone]  Permission to leave phone message Yes     Patient questions:  Do you have a fever, pain , or abdominal swelling? No. Pain Score  0 *  Have you tolerated food without any problems? Yes.    Have you been able to return to your normal activities? Yes.    Do you have any questions about your discharge instructions: Diet   No. Medications  No. Follow up visit  No.  Do you have questions or concerns about your Care? No.  Actions: * If pain score is 4 or above: No action needed, pain <4.

## 2015-10-12 DIAGNOSIS — L570 Actinic keratosis: Secondary | ICD-10-CM | POA: Diagnosis not present

## 2015-10-12 DIAGNOSIS — L723 Sebaceous cyst: Secondary | ICD-10-CM | POA: Diagnosis not present

## 2015-10-16 ENCOUNTER — Encounter: Payer: Self-pay | Admitting: Gastroenterology

## 2015-10-16 ENCOUNTER — Other Ambulatory Visit: Payer: Self-pay | Admitting: Physical Medicine and Rehabilitation

## 2015-10-16 DIAGNOSIS — M545 Low back pain: Secondary | ICD-10-CM

## 2015-10-18 ENCOUNTER — Ambulatory Visit
Admission: RE | Admit: 2015-10-18 | Discharge: 2015-10-18 | Disposition: A | Discharge status: Home / Self Care | Payer: Medicare Other | Source: Ambulatory Visit | Attending: Physical Medicine and Rehabilitation | Admitting: Physical Medicine and Rehabilitation

## 2015-10-18 DIAGNOSIS — M545 Low back pain: Secondary | ICD-10-CM

## 2015-10-18 DIAGNOSIS — M5126 Other intervertebral disc displacement, lumbar region: Secondary | ICD-10-CM | POA: Diagnosis not present

## 2015-10-22 ENCOUNTER — Encounter: Payer: Self-pay | Admitting: Gastroenterology

## 2015-10-25 DIAGNOSIS — M4316 Spondylolisthesis, lumbar region: Secondary | ICD-10-CM | POA: Diagnosis not present

## 2015-10-25 DIAGNOSIS — M47816 Spondylosis without myelopathy or radiculopathy, lumbar region: Secondary | ICD-10-CM | POA: Diagnosis not present

## 2015-10-25 DIAGNOSIS — M5416 Radiculopathy, lumbar region: Secondary | ICD-10-CM | POA: Diagnosis not present

## 2015-10-31 ENCOUNTER — Encounter: Payer: Self-pay | Admitting: Internal Medicine

## 2015-11-02 DIAGNOSIS — M5416 Radiculopathy, lumbar region: Secondary | ICD-10-CM | POA: Diagnosis not present

## 2015-11-02 DIAGNOSIS — M47816 Spondylosis without myelopathy or radiculopathy, lumbar region: Secondary | ICD-10-CM | POA: Diagnosis not present

## 2015-11-09 ENCOUNTER — Encounter: Payer: Self-pay | Admitting: Internal Medicine

## 2015-11-09 ENCOUNTER — Ambulatory Visit (INDEPENDENT_AMBULATORY_CARE_PROVIDER_SITE_OTHER): Payer: Medicare Other | Admitting: Internal Medicine

## 2015-11-09 ENCOUNTER — Other Ambulatory Visit (INDEPENDENT_AMBULATORY_CARE_PROVIDER_SITE_OTHER): Payer: Medicare Other

## 2015-11-09 VITALS — BP 144/58 | HR 72 | Temp 97.8°F | Resp 16 | Ht 63.0 in | Wt 171.0 lb

## 2015-11-09 DIAGNOSIS — R17 Unspecified jaundice: Secondary | ICD-10-CM

## 2015-11-09 DIAGNOSIS — D589 Hereditary hemolytic anemia, unspecified: Secondary | ICD-10-CM | POA: Diagnosis not present

## 2015-11-09 DIAGNOSIS — R10816 Epigastric abdominal tenderness: Secondary | ICD-10-CM

## 2015-11-09 LAB — CBC WITH DIFFERENTIAL/PLATELET
Basophils Absolute: 0 10*3/uL (ref 0.0–0.1)
Basophils Relative: 0.4 % (ref 0.0–3.0)
EOS PCT: 1.5 % (ref 0.0–5.0)
Eosinophils Absolute: 0.1 10*3/uL (ref 0.0–0.7)
HEMATOCRIT: 26.7 % — AB (ref 36.0–46.0)
HEMOGLOBIN: 9 g/dL — AB (ref 12.0–15.0)
LYMPHS ABS: 0.9 10*3/uL (ref 0.7–4.0)
Lymphocytes Relative: 11.1 % — ABNORMAL LOW (ref 12.0–46.0)
MCHC: 33.7 g/dL (ref 30.0–36.0)
MCV: 90.7 fl (ref 78.0–100.0)
MONO ABS: 0.7 10*3/uL (ref 0.1–1.0)
MONOS PCT: 8.8 % (ref 3.0–12.0)
NEUTROS ABS: 6.3 10*3/uL (ref 1.4–7.7)
Neutrophils Relative %: 78.2 % — ABNORMAL HIGH (ref 43.0–77.0)
Platelets: 290 10*3/uL (ref 150.0–400.0)
RBC: 2.94 Mil/uL — ABNORMAL LOW (ref 3.87–5.11)
RDW: 20.1 % — AB (ref 11.5–15.5)
WBC: 8.1 10*3/uL (ref 4.0–10.5)

## 2015-11-09 LAB — PROTIME-INR
INR: 1.3 ratio — ABNORMAL HIGH (ref 0.8–1.0)
Prothrombin Time: 13.7 s — ABNORMAL HIGH (ref 9.6–13.1)

## 2015-11-09 LAB — BASIC METABOLIC PANEL
BUN: 28 mg/dL — AB (ref 6–23)
CALCIUM: 9.2 mg/dL (ref 8.4–10.5)
CO2: 28 mEq/L (ref 19–32)
CREATININE: 1.42 mg/dL — AB (ref 0.40–1.20)
Chloride: 107 mEq/L (ref 96–112)
GFR: 37.68 mL/min — AB (ref >60.00)
GLUCOSE: 113 mg/dL — AB (ref 70–99)
Potassium: 4.7 mEq/L (ref 3.5–5.1)
Sodium: 141 mEq/L (ref 135–145)

## 2015-11-09 LAB — HEPATIC FUNCTION PANEL
ALBUMIN: 4.1 g/dL (ref 3.5–5.2)
ALT: 175 U/L — AB (ref 0–35)
AST: 123 U/L — ABNORMAL HIGH (ref 0–37)
Alkaline Phosphatase: 285 U/L — ABNORMAL HIGH (ref 39–117)
BILIRUBIN DIRECT: 2.1 mg/dL — AB (ref 0.0–0.3)
TOTAL PROTEIN: 6.7 g/dL (ref 6.0–8.3)
Total Bilirubin: 4.8 mg/dL — ABNORMAL HIGH (ref 0.2–1.2)

## 2015-11-09 LAB — LIPASE: LIPASE: 26 U/L (ref 11.0–59.0)

## 2015-11-09 LAB — AMYLASE: AMYLASE: 35 U/L (ref 27–131)

## 2015-11-09 NOTE — Patient Instructions (Signed)

## 2015-11-09 NOTE — Progress Notes (Signed)
Subjective:  Patient ID: Claudia Lara, female    DOB: 1932-08-15  Age: 81 y.o. MRN: [id number]  CC: Abdominal Pain   HPI Claudia Lara presents for a one-week history of vague discomfort in her epigastric region. She tells me that a week ago she had the acute onset of epigastric pain with nausea. She called EMS who came to the scene and did an EKG and told her she was okay. Since then she has had persistent episodes of nausea but no vomiting. The epigastric discomfort is very vague and poorly described. The pain comes and goes. She is also lost a few pounds.  Outpatient Prescriptions Prior to Visit  Medication Sig Dispense Refill  . Ascorbic Acid (VITAMIN C) 500 MG tablet Take 500 mg by mouth every morning.     . Calcium Carbonate-Vit D-Min (CALCIUM 600 + MINERALS) 600-200 MG-UNIT TABS Take 1 tablet by mouth every morning.     . carvedilol (COREG) 12.5 MG tablet TAKE 1 TABLET BY MOUTH 2 TIMES DAILY WITH A MEAL. 360 tablet 1  . cholecalciferol (VITAMIN D) 1000 UNITS tablet Take 1,000 Units by mouth every morning.     . Glucosamine-Chondroit-Vit C-Mn (GLUCOSAMINE 1500 COMPLEX) CAPS Take 1 capsule by mouth at bedtime.     . Multiple Vitamins-Minerals (CENTRUM SILVER ADULT 50+) TABS Take 1 tablet by mouth every morning.    . pantoprazole (PROTONIX) 40 MG tablet Take 1 tablet (40 mg total) by mouth daily. 90 tablet 3  . Tetrahydrozoline HCl (VISINE OP) Place 1-2 drops into both eyes daily as needed (dry eyes.).     [name] torsemide (DEMADEX) 10 MG tablet Take 10 mg by mouth daily as needed (edema).    . valsartan (DIOVAN) 320 MG tablet Take 1 tablet (320 mg total) by mouth daily. (Patient not taking: Reported on 11/09/2015) 90 tablet 3   No facility-administered medications prior to visit.    ROS Review of Systems  Constitutional: Positive for appetite change and unexpected weight change. Negative for fever, chills, diaphoresis, activity change and fatigue.  HENT: Negative.  Negative for sore  throat and trouble swallowing.   Eyes: Negative.   Respiratory: Negative.  Negative for cough, choking, shortness of breath and stridor.   Cardiovascular: Negative.  Negative for chest pain, palpitations and leg swelling.  Gastrointestinal: Positive for nausea and abdominal pain. Negative for diarrhea, constipation, blood in stool, anal bleeding and rectal pain.  Endocrine: Negative.   Genitourinary: Negative.  Negative for dysuria, urgency, frequency, hematuria, flank pain, decreased urine volume, enuresis and difficulty urinating.  Musculoskeletal: Negative.  Negative for back pain and neck pain.  Skin: Negative.  Negative for color change and rash.  Allergic/Immunologic: Negative.   Neurological: Negative.  Negative for dizziness.  Hematological: Negative.  Negative for adenopathy. Does not bruise/bleed easily.  Psychiatric/Behavioral: Negative.     Objective:  BP 144/58 mmHg  Pulse 72  Temp(Src) 97.8 F (36.6 C) (Oral)  Resp 16  Ht 5\' 3"  (1.6 m)  Wt 171 lb (77.565 kg)  BMI 30.30 kg/m2  SpO2 97%  BP Readings from Last 3 Encounters:  11/09/15 144/58  10/10/15 116/59  10/02/15 130/60    Wt Readings from Last 3 Encounters:  11/09/15 171 lb (77.565 kg)  10/10/15 173 lb (78.472 kg)  10/02/15 169 lb 12.8 oz (77.021 kg)    Physical Exam  Constitutional: She appears well-developed and well-nourished.  Non-toxic appearance. She does not have a sickly appearance. She does not appear ill. No distress.  HENT:  Mouth/Throat: Oropharynx is clear and moist. No oropharyngeal exudate.  Eyes: Conjunctivae are normal. Right eye exhibits no discharge. Left eye exhibits no discharge. Scleral icterus is present.  Neck: Normal range of motion. Neck supple. No JVD present. No tracheal deviation present. No thyromegaly present.  Cardiovascular: Normal rate, regular rhythm, normal heart sounds and intact distal pulses.  Exam reveals no gallop and no friction rub.   No murmur  heard. Pulmonary/Chest: Effort normal and breath sounds normal. No stridor. No respiratory distress. She has no wheezes. She has no rales. She exhibits no tenderness.  Abdominal: Soft. Normal appearance and bowel sounds are normal. She exhibits no distension and no mass. There is no hepatosplenomegaly, splenomegaly or hepatomegaly. There is tenderness in the epigastric area. There is no rigidity, no rebound, no guarding, no CVA tenderness, no tenderness at McBurney's point and negative Murphy's sign. No hernia. Hernia confirmed negative in the ventral area.  Musculoskeletal: Normal range of motion. She exhibits no edema or tenderness.  Lymphadenopathy:    She has no cervical adenopathy.  Skin: Skin is warm and dry. No rash noted. She is not diaphoretic. No erythema. No pallor.  Vitals reviewed.   Lab Results  Component Value Date   WBC 8.1 11/09/2015   HGB 9.0* 11/09/2015   HCT 26.7* 11/09/2015   PLT 290.0 11/09/2015   GLUCOSE 113* 11/09/2015   CHOL 136 05/15/2015   TRIG 195.0* 05/15/2015   HDL 48.30 05/15/2015   LDLCALC 49 05/15/2015   ALT 175* 11/09/2015   AST 123* 11/09/2015   NA 141 11/09/2015   K 4.7 11/09/2015   CL 107 11/09/2015   CREATININE 1.42* 11/09/2015   BUN 28* 11/09/2015   CO2 28 11/09/2015   TSH 0.88 05/15/2015   INR 1.3* 11/09/2015   HGBA1C 5.1 12/02/2011    Mr Lumbar Spine Wo Contrast  10/18/2015  CLINICAL DATA:  Chronic low back pain. Difficulty standing straight at times. EXAM: MRI LUMBAR SPINE WITHOUT CONTRAST TECHNIQUE: Multiplanar, multisequence MR imaging of the lumbar spine was performed. No intravenous contrast was administered. COMPARISON:  Report of MRI lumbar spine 07/16/1999. The images are no longer available. FINDINGS: Normal signal is present in the conus medullaris which terminates at L1. Endplate Schmorl's nodes are present from T10-11 through L5-S1. Levoconvex curvature of the lumbar spine is centered at L4. Degenerative grade 1 anterolisthesis  is present at L3-4, L4-5, and L5-S1. A remote superior endplate fractures present at L2. Vertebral body heights and alignment are otherwise maintained. Limited imaging of the abdomen is unremarkable. L1-2: A broad-based disc protrusion is present. Mild facet hypertrophy is noted bilaterally. This results in mild subarticular and foraminal narrowing bilaterally, worse on the left. L2-3: A broad-based disc protrusion moderate facet hypertrophy are noted bilaterally. Mild subarticular and foraminal narrowing is present bilaterally. L3-4: A broad-based disc protrusion is present. Moderate facet hypertrophy is worse on the right. Moderate right and mild left subarticular narrowing is present. Mild to moderate right and mild left foraminal narrowing is present. L4-5: A broad-based disc protrusion is present. Moderate facet hypertrophy is noted. This results in moderate subarticular narrowing bilaterally. Mild to moderate right and mild left foraminal stenosis is present. L5-S1: A broad-based disc protrusion is present. Facet hypertrophy is worse on the left. Mild subarticular narrowing is worse on the left. Mild right and moderate left foraminal stenosis is present. IMPRESSION: 1. Multilevel sub S for burst as described. 2. Levoconvex curvature of the lumbar spine is centered at L4. 3. Mild  subarticular and foraminal narrowing bilaterally at L1-2 is worse on the left. 4. Mild subarticular and foraminal narrowing bilaterally at L2-3. 5. Moderate right and mild left subarticular narrowing at L3-4. Foraminal narrowing is mild to moderate on the right and mild on the left. 6. Moderate subarticular narrowing bilaterally at L4-5. Foraminal narrowing is mild-to-moderate on the right and mild on the left. 7. Mild subarticular narrowing at L5-S1 is worse on the left. Moderate left and mild right foraminal narrowing is present at this level. Electronically Signed   By: San Morelle M.D.   On: 10/18/2015 14:15     Assessment & Plan:   Anyea was seen today for abdominal pain.  Diagnoses and all orders for this visit:  Scleral icterus- her liver function studies show a cholestatic process and an elevated alkaline phosphatase and mildly elevated LFTs. Her pro time does not show significant panic damage. Will get a CT scan of the abdomen to see if she has a mass or some other form of obstruction. I do not see any medications that could be causing this. -     CBC with Differential/Platelet; Future -     Protime-INR; Future -     Hepatic function panel; Future -     Amylase; Future -     Lipase; Future -     CT Abdomen Pelvis W Contrast; Future  Epigastric abdominal tenderness without rebound tenderness- her liver enzymes are elevated with the most prominent being alkaline phosphatase, her AST and ALT are about 3-4 times abnormally elevated, her bilirubin is also elevated. Her pancreatic enzymes are normal, this appears to be a cholestatic hepatic process. I have ordered a stat CT scan of the abdomen with contrast to see if she has an obstructive lesion, gallstone, or infiltrating process to cause this picture. -     CBC with Differential/Platelet; Future -     Basic metabolic panel; Future -     Protime-INR; Future -     Amylase; Future -     Lipase; Future -     CT Abdomen Pelvis W Contrast; Future  Hereditary hemolytic anemia ([place])- her hemoglobin and hematocrit are stable, her haptoglobin is normal so I do not think she is hemolyzing her red blood cells. -     CBC with Differential/Platelet; Future -     Hepatic function panel; Future -     Haptoglobin; Future   I have discontinued Ms. Brayfield's valsartan and torsemide. I am also having her maintain her CALCIUM 600 + MINERALS, GLUCOSAMINE 1500 COMPLEX, vitamin C, cholecalciferol, carvedilol, CENTRUM SILVER ADULT 50+, Tetrahydrozoline HCl (VISINE OP), and pantoprazole.  No orders of the defined types were placed in this encounter.      Follow-up: Return in about 1 week (around 11/16/2015).  [name], MD

## 2015-11-09 NOTE — Progress Notes (Signed)
Pre visit review using our clinic review tool, if applicable. No additional management support is needed unless otherwise documented below in the visit note. 

## 2015-11-10 ENCOUNTER — Encounter: Payer: Self-pay | Admitting: Internal Medicine

## 2015-11-10 LAB — HAPTOGLOBIN: Haptoglobin: 96 mg/dL (ref 43–212)

## 2015-11-13 ENCOUNTER — Encounter: Payer: Self-pay | Admitting: Internal Medicine

## 2015-11-13 ENCOUNTER — Other Ambulatory Visit: Payer: Self-pay | Admitting: Internal Medicine

## 2015-11-13 ENCOUNTER — Telehealth: Payer: Self-pay | Admitting: Internal Medicine

## 2015-11-13 DIAGNOSIS — R17 Unspecified jaundice: Secondary | ICD-10-CM

## 2015-11-13 DIAGNOSIS — R10816 Epigastric abdominal tenderness: Secondary | ICD-10-CM

## 2015-11-13 NOTE — Telephone Encounter (Signed)
States [facility] they sent over a fax Friday

## 2015-11-13 NOTE — Telephone Encounter (Signed)
Can either of you ladies help with this?

## 2015-11-13 NOTE — Telephone Encounter (Signed)
States she got a call from her insurance company stating that they denied the CT ordered and that they recommend an ultrasound.  Patient is requesting call in regards.

## 2015-11-14 NOTE — Telephone Encounter (Signed)
We did not receive a fax about this

## 2015-11-14 NOTE — Telephone Encounter (Signed)
Thank you Majel Homer: PCP

## 2015-11-14 NOTE — Telephone Encounter (Signed)
[employer] denied CT scan. I have sent in an appeal, but in the meantime the patient is scheduled for an ultrasound. That's what her insurance has stated they will pay for.

## 2015-11-22 ENCOUNTER — Encounter: Payer: Self-pay | Admitting: Internal Medicine

## 2015-11-22 ENCOUNTER — Ambulatory Visit
Admission: RE | Admit: 2015-11-22 | Discharge: 2015-11-22 | Disposition: A | Discharge status: Home / Self Care | Payer: Medicare Other | Source: Ambulatory Visit | Attending: Internal Medicine | Admitting: Internal Medicine

## 2015-11-22 DIAGNOSIS — R17 Unspecified jaundice: Secondary | ICD-10-CM

## 2015-11-22 DIAGNOSIS — R10816 Epigastric abdominal tenderness: Secondary | ICD-10-CM

## 2015-11-22 DIAGNOSIS — R1013 Epigastric pain: Secondary | ICD-10-CM | POA: Diagnosis not present

## 2015-11-28 ENCOUNTER — Other Ambulatory Visit (INDEPENDENT_AMBULATORY_CARE_PROVIDER_SITE_OTHER): Payer: Medicare Other

## 2015-11-28 ENCOUNTER — Encounter: Payer: Self-pay | Admitting: Physician Assistant

## 2015-11-28 ENCOUNTER — Ambulatory Visit (INDEPENDENT_AMBULATORY_CARE_PROVIDER_SITE_OTHER): Payer: Medicare Other | Admitting: Physician Assistant

## 2015-11-28 VITALS — BP 128/40 | HR 80 | Ht 63.0 in | Wt 172.5 lb

## 2015-11-28 DIAGNOSIS — R7989 Other specified abnormal findings of blood chemistry: Secondary | ICD-10-CM

## 2015-11-28 DIAGNOSIS — R945 Abnormal results of liver function studies: Secondary | ICD-10-CM

## 2015-11-28 DIAGNOSIS — K8031 Calculus of bile duct with cholangitis, unspecified, with obstruction: Secondary | ICD-10-CM

## 2015-11-28 LAB — HEPATIC FUNCTION PANEL
ALT: 26 U/L (ref 0–35)
AST: 26 U/L (ref 0–37)
Albumin: 4 g/dL (ref 3.5–5.2)
Alkaline Phosphatase: 143 U/L — ABNORMAL HIGH (ref 39–117)
BILIRUBIN DIRECT: 0.9 mg/dL — AB (ref 0.0–0.3)
TOTAL PROTEIN: 6.2 g/dL (ref 6.0–8.3)
Total Bilirubin: 2.3 mg/dL — ABNORMAL HIGH (ref 0.2–1.2)

## 2015-11-28 LAB — CBC WITH DIFFERENTIAL/PLATELET
BASOS ABS: 0 10*3/uL (ref 0.0–0.1)
BASOS PCT: 0.3 % (ref 0.0–3.0)
EOS ABS: 0.2 10*3/uL (ref 0.0–0.7)
Eosinophils Relative: 2.2 % (ref 0.0–5.0)
HCT: 29.2 % — ABNORMAL LOW (ref 36.0–46.0)
HEMOGLOBIN: 9.9 g/dL — AB (ref 12.0–15.0)
Lymphocytes Relative: 10.4 % — ABNORMAL LOW (ref 12.0–46.0)
Lymphs Abs: 0.9 10*3/uL (ref 0.7–4.0)
MCHC: 33.8 g/dL (ref 30.0–36.0)
MCV: 89.8 fl (ref 78.0–100.0)
MONO ABS: 0.6 10*3/uL (ref 0.1–1.0)
Monocytes Relative: 6.3 % (ref 3.0–12.0)
Neutro Abs: 7.4 10*3/uL (ref 1.4–7.7)
Neutrophils Relative %: 80.8 % — ABNORMAL HIGH (ref 43.0–77.0)
PLATELETS: 272 10*3/uL (ref 150.0–400.0)
RBC: 3.26 Mil/uL — ABNORMAL LOW (ref 3.87–5.11)
RDW: 19.9 % — AB (ref 11.5–15.5)
WBC: 9.1 10*3/uL (ref 4.0–10.5)

## 2015-11-28 LAB — BASIC METABOLIC PANEL
BUN: 24 mg/dL — ABNORMAL HIGH (ref 6–23)
CO2: 27 mEq/L (ref 19–32)
Calcium: 9.2 mg/dL (ref 8.4–10.5)
Chloride: 107 mEq/L (ref 96–112)
Creatinine, Ser: 1.29 mg/dL — ABNORMAL HIGH (ref 0.40–1.20)
GFR: 42.09 mL/min — AB (ref >60.00)
GLUCOSE: 125 mg/dL — AB (ref 70–99)
POTASSIUM: 4.8 meq/L (ref 3.5–5.1)
SODIUM: 140 meq/L (ref 135–145)

## 2015-11-28 LAB — LIPASE: LIPASE: 27 U/L (ref 7–60)

## 2015-11-28 LAB — GAMMA GT: GGT: 134 U/L — ABNORMAL HIGH (ref 7–51)

## 2015-11-28 MED ORDER — CIPROFLOXACIN HCL 250 MG PO TABS: 250.0000 mg | ORAL_TABLET | Freq: Two times a day (BID) | ORAL | Status: DC

## 2015-11-28 NOTE — Progress Notes (Signed)
Patient ID: Claudia Lara, female   DOB: 1932-02-16, 82 y.o.   MRN: [id number]     History of Present Illness: Claudia Lara  Is a delightful 82 year old female who was known to Dr. Ardis Lara. She was seen in September 2016 2 valuate dark Hemoccult positive stools. She had had a colonoscopy in 2007 by Dr. Penelope Lara.  at which time she was found to have hyperplastic polyps, no adenomatous polyps. EGD by Dr. Ardis Lara in 2010 for anemia showed mild gastritis hiatal hernia mild esophagitis , H. Pylori negative , biopsies negative for sprue. Colonoscopy by Dr. Ardis Lara 10/10/2015 showed 4 sessile polyps ranging between 3-5 mm in size found in the transverse colon and ascending colon , polypectomies were performed with a cold snare. Pathology showed no adenomatous changes.   Claudia Lara states that several weeks ago she developed severe epigastric pain and nausea. She was evaluated by her PCP and  Noted to have scleral icterus. She had an abdominal ultrasound that showed gallstones, intrahepatic bile duct dilatation and findings concerning for choledocholithiasis. Common bile duct had increased caliber measuring 14 mm. 7 mm calculus identified within the common bile duct. Hepatic function panel showed a total bili of 4.8, direct bili 2.1, alkaline phosphatase 285, AST 123, ALT 175. Patient  States that at the time she had the pain and for several days later, her urine was dark brown. She states her pain is totally resolved and she has no nausea. Her urine is back to normal color. She has not had any fever, chills, or night sweats. She states she had a similar episode of pain earlier this year but it went away quickly.   Past Medical History  Diagnosis Date  . ANEMIA 08/22/2009  . Unspecified essential hypertension 10/19/2007  . CAROTID ARTERY STENOSIS 01/16/2010  . PVD 10/06/2007  . HEMORRHOIDS, INTERNAL     surgery was in the 90's (per patient)  . GEN OSTEOARTHROSIS INVOLVING MULTIPLE SITES 11/11/2008  . NECK PAIN,  ACUTE 12/28/2009  . PERIPHERAL EDEMA 10/06/2007  . DYSPNEA ON EXERTION 11/16/2007  . Gallstones     Past Surgical History  Procedure Laterality Date  . Hyperplastic colon polyps, removed  2007    By Dr. Penelope Lara  . Oophorectomy    . Rotator cuff repair  2004    left (Dr. Durward Lara)  . Abdominal hysterectomy    . Joint replacement  2011    right knee  . Total knee arthroplasty Right    Family History  Problem Relation Age of Onset  . Ovarian cancer Mother   . Cancer Mother   . Leukemia Father   . Lung cancer Father   . Cancer Father   . Diabetes Neg Hx   . Heart disease Neg Hx   . Hypertension Neg Hx   . Cancer Brother    Social History  Substance Use Topics  . Smoking status: Never Smoker   . Smokeless tobacco: Never Used  . Alcohol Use: No   Current Outpatient Prescriptions  Medication Sig Dispense Refill  . Ascorbic Acid (VITAMIN C) 500 MG tablet Take 500 mg by mouth every morning.     . Calcium Carbonate-Vit D-Min (CALCIUM 600 + MINERALS) 600-200 MG-UNIT TABS Take 1 tablet by mouth every morning.     . carvedilol (COREG) 12.5 MG tablet TAKE 1 TABLET BY MOUTH 2 TIMES DAILY WITH A MEAL. 360 tablet 1  . cholecalciferol (VITAMIN D) 1000 UNITS tablet Take 1,000 Units by mouth every morning.     [name]  Glucosamine-Chondroit-Vit C-Mn (GLUCOSAMINE 1500 COMPLEX) CAPS Take 1 capsule by mouth at bedtime.     . Multiple Vitamins-Minerals (CENTRUM SILVER ADULT 50+) TABS Take 1 tablet by mouth every morning.    . pantoprazole (PROTONIX) 40 MG tablet Take 1 tablet (40 mg total) by mouth daily. 90 tablet 3  . ciprofloxacin (CIPRO) 250 MG tablet Take 1 tablet (250 mg total) by mouth 2 (two) times daily. (Patient taking differently: Take 250 mg by mouth 2 (two) times daily. Take for 5 days; starting 11/28/15) 10 tablet 0  . Polyethyl Glycol-Propyl Glycol (SYSTANE OP) Apply 1-2 drops to eye daily as needed (dry eyes).     No current facility-administered medications for this visit.   Allergies    Allergen Reactions  . Sulfonamide Derivatives Rash    Childhood reaction     Review of Systems: Gen: Denies any fever, chills, sweats, anorexia, fatigue, weakness, malaise, weight loss, and sleep disorder CV: Denies chest pain, angina, palpitations, syncope, orthopnea, PND, peripheral edema, and claudication. Resp: Denies dyspnea at rest, dyspnea with exercise, cough, sputum, wheezing, coughing up blood, and pleurisy. GI: Denies vomiting blood, jaundice, and fecal incontinence.   Denies dysphagia or odynophagia. GU : Denies urinary burning, blood in urine, urinary frequency, urinary hesitancy, nocturnal urination, and urinary incontinence. MS: Denies joint pain, limitation of movement, and swelling, stiffness, low back pain, extremity pain. Denies muscle weakness, cramps, atrophy.  Derm: Denies rash, itching, dry skin, hives, moles, warts, or unhealing ulcers.  Psych: Denies depression, anxiety, memory loss, suicidal ideation, hallucinations, paranoia, and confusion. Heme: Denies bruising, bleeding, and enlarged lymph nodes. Neuro:  Denies any headaches, dizziness, paresthesia Endo:  Denies any problems with DM, thyroid, adrenal   Studies:   US Abdomen Complete  11/22/2015  CLINICAL DATA:  Epigastric pain. EXAM: ABDOMEN ULTRASOUND COMPLETE COMPARISON:  None. FINDINGS: Gallbladder: Multiple stones are identified within the gallbladder. No gallbladder wall thickening or pericholecystic fluid. Common bile duct: Diameter: Increased caliber measuring 14 mm. 7 mm calculus is identified within the common bile duct. Liver: Intrahepatic bile duct dilatation. Parenchymal echogenicity is normal. No focal liver abnormality. IVC: No abnormality visualized. Pancreas: Visualized portion unremarkable. Spleen: Size and appearance within normal limits. Right Kidney: Length: 8.6 cm. Diffuse cortical thinning. Upper pole cyst measures 1.2 cm. No mass or hydronephrosis. Left Kidney: Length: 9.9 cm. Within the  inferior pole of the left kidney there is a small cyst measuring 2.2 cm. Echogenicity within normal limits. No mass or hydronephrosis visualized. Abdominal aorta: No aneurysm visualized. Other findings: None. IMPRESSION: 1. Gallstones, intrahepatic bile duct dilatation and findings concerning for choledocholithiasis. If further imaging is clinically indicated an MRCP may be helpful to evaluate the extent of choledocholithiasis. 2. Renal cysts. 3. Right renal atrophy. Electronically Signed   By: Kerby Moors M.D.   On: 11/22/2015 09:48     Physical Exam: BP 128/40 mmHg  Pulse 80  Ht 5\' 3"  (1.6 m)  Wt 172 lb 8 oz (78.245 kg)  BMI 30.56 kg/m2 General: Pleasant, well developed , female in no acute distress Head: Normocephalic and atraumatic Eyes:  sclerae  Mildly icteric, conjunctiva pink  Ears: Normal auditory acuity Lungs: Clear throughout to auscultation Heart: Regular rate and rhythm Abdomen: Soft, non distended, non-tender. No masses, no hepatomegaly. Normal bowel sounds Musculoskeletal: Symmetrical with no gross deformities  Extremities: No edema  Neurological: Alert oriented x 4, grossly nonfocal Psychological:  Alert and cooperative. Normal mood and affect  Assessment and Recommendations:   82 year old female status post  an episode of epigastric abdominal pain and nausea found to have common bile duct measuring 14 mm with a 7 mm calculus identified within the common bile duct, as well as intrahepatic ductal dilatation. Patient has been advised to undergo an ERCP.The risks, benefits, and possible complications of the procedure, including  But not limited to bleeding, perforation, surgery, and the 5-10% risk for pancreatitis, were explained to the patient.  It was explained that  post ERCP pancreatitis which, while usually is mild, occasionally maybe severe and even life-threatening.  Patient's questions were answered. The procedure will be scheduled with Dr. Ardis Lara. In the meantime  patient has been given a prescription for Cipro 250 mg twice daily for 5 days. A repeat CBC, basic metabolic panel, lipase, hepatic function panel , and GGT will be obtained today. Patient has been instructed that if she experiences worsening pain or fever on the antibiotics she should go to the emergency room.       Piper Hassebrock, Vita Barley PA-C 11/28/2015,

## 2015-11-28 NOTE — Patient Instructions (Signed)
You have been scheduled for your ERCP at [facility] have been given separate instructions.  We have sent the following medications to your pharmacy for you to pick up at your convenience: [facility] has requested that you go to the basement for lab work before leaving today.

## 2015-11-29 ENCOUNTER — Encounter (HOSPITAL_COMMUNITY): Payer: Self-pay | Admitting: *Deleted

## 2015-11-29 ENCOUNTER — Other Ambulatory Visit: Payer: Self-pay

## 2015-11-29 NOTE — Progress Notes (Signed)
I agree with the above note, plan; LFTs a bit improved yesterday.  ERCP later this week.

## 2015-12-01 ENCOUNTER — Ambulatory Visit (HOSPITAL_COMMUNITY)
Admission: RE | Admit: 2015-12-01 | Discharge: 2015-12-01 | Disposition: A | Discharge status: Home / Self Care | Payer: Medicare Other | Source: Ambulatory Visit | Attending: Gastroenterology | Admitting: Gastroenterology

## 2015-12-01 ENCOUNTER — Ambulatory Visit (HOSPITAL_COMMUNITY): Payer: Medicare Other | Admitting: Certified Registered"

## 2015-12-01 ENCOUNTER — Encounter (HOSPITAL_COMMUNITY): Payer: Self-pay

## 2015-12-01 ENCOUNTER — Ambulatory Visit (HOSPITAL_COMMUNITY): Payer: Medicare Other

## 2015-12-01 ENCOUNTER — Telehealth: Payer: Self-pay

## 2015-12-01 ENCOUNTER — Encounter (HOSPITAL_COMMUNITY)
Admission: RE | Disposition: A | Discharge status: Home / Self Care | Payer: Self-pay | Source: Ambulatory Visit | Attending: Gastroenterology

## 2015-12-01 DIAGNOSIS — N289 Disorder of kidney and ureter, unspecified: Secondary | ICD-10-CM | POA: Diagnosis not present

## 2015-12-01 DIAGNOSIS — I739 Peripheral vascular disease, unspecified: Secondary | ICD-10-CM | POA: Insufficient documentation

## 2015-12-01 DIAGNOSIS — I10 Essential (primary) hypertension: Secondary | ICD-10-CM | POA: Insufficient documentation

## 2015-12-01 DIAGNOSIS — K8020 Calculus of gallbladder without cholecystitis without obstruction: Secondary | ICD-10-CM | POA: Diagnosis not present

## 2015-12-01 DIAGNOSIS — I6529 Occlusion and stenosis of unspecified carotid artery: Secondary | ICD-10-CM | POA: Diagnosis not present

## 2015-12-01 DIAGNOSIS — M1990 Unspecified osteoarthritis, unspecified site: Secondary | ICD-10-CM | POA: Diagnosis not present

## 2015-12-01 DIAGNOSIS — Z79899 Other long term (current) drug therapy: Secondary | ICD-10-CM | POA: Insufficient documentation

## 2015-12-01 DIAGNOSIS — Z96651 Presence of right artificial knee joint: Secondary | ICD-10-CM | POA: Insufficient documentation

## 2015-12-01 DIAGNOSIS — K219 Gastro-esophageal reflux disease without esophagitis: Secondary | ICD-10-CM | POA: Diagnosis not present

## 2015-12-01 DIAGNOSIS — K8031 Calculus of bile duct with cholangitis, unspecified, with obstruction: Secondary | ICD-10-CM

## 2015-12-01 DIAGNOSIS — K8070 Calculus of gallbladder and bile duct without cholecystitis without obstruction: Secondary | ICD-10-CM | POA: Diagnosis not present

## 2015-12-01 DIAGNOSIS — I129 Hypertensive chronic kidney disease with stage 1 through stage 4 chronic kidney disease, or unspecified chronic kidney disease: Secondary | ICD-10-CM | POA: Diagnosis not present

## 2015-12-01 DIAGNOSIS — N189 Chronic kidney disease, unspecified: Secondary | ICD-10-CM | POA: Diagnosis not present

## 2015-12-01 DIAGNOSIS — K8050 Calculus of bile duct without cholangitis or cholecystitis without obstruction: Secondary | ICD-10-CM | POA: Diagnosis present

## 2015-12-01 HISTORY — DX: Other complications of anesthesia, initial encounter: T88.59XA

## 2015-12-01 HISTORY — DX: Adverse effect of unspecified anesthetic, initial encounter: T41.45XA

## 2015-12-01 HISTORY — PX: ENDOSCOPIC RETROGRADE CHOLANGIOPANCREATOGRAPHY (ERCP) WITH PROPOFOL: SHX5810

## 2015-12-01 SURGERY — ENDOSCOPIC RETROGRADE CHOLANGIOPANCREATOGRAPHY (ERCP) WITH PROPOFOL
Anesthesia: General

## 2015-12-01 MED ORDER — FENTANYL CITRATE (PF) 100 MCG/2ML IJ SOLN
INTRAMUSCULAR | Status: AC
  Filled 2015-12-01: qty 2

## 2015-12-01 MED ORDER — DEXAMETHASONE SODIUM PHOSPHATE 10 MG/ML IJ SOLN
INTRAMUSCULAR | Status: AC
  Filled 2015-12-01: qty 1

## 2015-12-01 MED ORDER — SODIUM CHLORIDE 0.9 % IV SOLN: INTRAVENOUS | Status: DC

## 2015-12-01 MED ORDER — INDOMETHACIN 50 MG RE SUPP
RECTAL | Status: AC
  Filled 2015-12-01: qty 2

## 2015-12-01 MED ORDER — DEXAMETHASONE SODIUM PHOSPHATE 10 MG/ML IJ SOLN
INTRAMUSCULAR | Status: DC | PRN
  Administered 2015-12-01: 5 mg via INTRAVENOUS

## 2015-12-01 MED ORDER — ONDANSETRON HCL 4 MG/2ML IJ SOLN
INTRAMUSCULAR | Status: DC | PRN
  Administered 2015-12-01 (×2): 4 mg via INTRAVENOUS

## 2015-12-01 MED ORDER — ONDANSETRON HCL 4 MG/2ML IJ SOLN
INTRAMUSCULAR | Status: AC
  Filled 2015-12-01: qty 4

## 2015-12-01 MED ORDER — PHENYLEPHRINE 40 MCG/ML (10ML) SYRINGE FOR IV PUSH (FOR BLOOD PRESSURE SUPPORT)
PREFILLED_SYRINGE | INTRAVENOUS | Status: AC
  Filled 2015-12-01: qty 10

## 2015-12-01 MED ORDER — FENTANYL CITRATE (PF) 100 MCG/2ML IJ SOLN
INTRAMUSCULAR | Status: DC | PRN
  Administered 2015-12-01 (×2): 50 ug via INTRAVENOUS

## 2015-12-01 MED ORDER — CIPROFLOXACIN IN D5W 400 MG/200ML IV SOLN
INTRAVENOUS | Status: AC
  Filled 2015-12-01: qty 200

## 2015-12-01 MED ORDER — SODIUM CHLORIDE 0.9 % IV SOLN
INTRAVENOUS | Status: DC | PRN
  Administered 2015-12-01: 25 mL

## 2015-12-01 MED ORDER — PROPOFOL 10 MG/ML IV BOLUS
INTRAVENOUS | Status: DC | PRN
  Administered 2015-12-01: 150 mg via INTRAVENOUS

## 2015-12-01 MED ORDER — CIPROFLOXACIN IN D5W 400 MG/200ML IV SOLN
INTRAVENOUS | Status: DC | PRN
Start: 2015-12-01 — End: 2015-12-01
  Administered 2015-12-01: 400 mg via INTRAVENOUS

## 2015-12-01 MED ORDER — LACTATED RINGERS IV SOLN
INTRAVENOUS | Status: DC
  Administered 2015-12-01: 1000 mL via INTRAVENOUS

## 2015-12-01 MED ORDER — PHENYLEPHRINE HCL 10 MG/ML IJ SOLN
INTRAMUSCULAR | Status: DC | PRN
Start: 2015-12-01 — End: 2015-12-01
  Administered 2015-12-01: 80 ug via INTRAVENOUS
  Administered 2015-12-01: 40 ug via INTRAVENOUS

## 2015-12-01 MED ORDER — LIDOCAINE HCL (PF) 2 % IJ SOLN
INTRAMUSCULAR | Status: DC | PRN
  Administered 2015-12-01: 60 mg via INTRADERMAL

## 2015-12-01 MED ORDER — ROCURONIUM BROMIDE 100 MG/10ML IV SOLN
INTRAVENOUS | Status: AC
  Filled 2015-12-01: qty 1

## 2015-12-01 MED ORDER — SUCCINYLCHOLINE CHLORIDE 20 MG/ML IJ SOLN
INTRAMUSCULAR | Status: DC | PRN
  Administered 2015-12-01: 100 mg via INTRAVENOUS

## 2015-12-01 MED ORDER — LIDOCAINE HCL (CARDIAC) 20 MG/ML IV SOLN
INTRAVENOUS | Status: AC
  Filled 2015-12-01: qty 5

## 2015-12-01 MED ORDER — INDOMETHACIN 50 MG RE SUPP
RECTAL | Status: DC | PRN
  Administered 2015-12-01: 100 mg via RECTAL

## 2015-12-01 MED ORDER — PROPOFOL 10 MG/ML IV BOLUS
INTRAVENOUS | Status: AC
  Filled 2015-12-01: qty 20

## 2015-12-01 NOTE — Anesthesia Preprocedure Evaluation (Addendum)
Anesthesia Evaluation  Patient identified by MRN, date of birth, ID band Patient awake    Reviewed: Allergy & Precautions, NPO status   History of Anesthesia Complications (+) PROLONGED EMERGENCE and history of anesthetic complications  Airway Mallampati: II  TM Distance: >3 FB Neck ROM: Full    Dental  (+) Teeth Intact   Pulmonary shortness of breath,    breath sounds clear to auscultation       Cardiovascular hypertension, + Peripheral Vascular Disease   Rhythm:Regular Rate:Normal               [facility]            [address], [place] [address]              [phone]  ------------------------------------------------------------------- Transthoracic Echocardiography  Patient:  Claudia Lara, Claudia Lara MR #:    [id number] Study Date: 09/29/2014 Gender:   F Age:    82 Height:   160 cm Weight:   80.7 kg BSA:    1.92 m^2 Pt. Status: Room:  [place]  Dorris Carnes, M.D. SONOGRAPHER Cindy Hazy, RDCS PERFORMING  [facility], Outpatient  cc:  ------------------------------------------------------------------- LV EF: 55% -  60%  ------------------------------------------------------------------- Indications:   I 35.9 Aortic Valve Disorder. R06.09 Dyspnea on Exertion.  ------------------------------------------------------------------- History:  PMH: Acquired from the patient and from the patient&'s chart. PMH: Chronic Kidney Disease. Chronic Venous Insufficiency. Carotid Artery Stenosis. Edema. Murmur. Risk factors: Hypertension.  ------------------------------------------------------------------- Study Conclusions  - Left ventricle: E/e&'>13.5 suggestive of elevated LV filling pressures. The cavity size was normal. There was mild  concentric hypertrophy. Systolic function was normal. The estimated ejection fraction was in the range of 55% to 60%. Wall motion was normal; there were no regional wall motion abnormalities. There was an increased relative contribution of atrial contraction to ventricular filling. Doppler parameters are consistent with abnormal left ventricular relaxation (grade 1 diastolic dysfunction). - Aortic valve: Poorly visualized. Trileaflet; normal thickness, mildly calcified leaflets. There was very mild stenosis. - Mitral valve: There was mild to moderate regurgitation.  Transthoracic echocardiography. M-mode, complete 2D, spectral Doppler, and color Doppler. Birthdate: Patient birthdate: 01/05/1932. Age: Patient is 82 yr old. Sex: Gender: female. BMI: 31.5 kg/m^2. Blood pressure:   150/78 Patient status: Outpatient. Study date: Study date: 09/29/2014. Study time: 10:41      Neuro/Psych negative neurological ROS     GI/Hepatic GERD  ,  Endo/Other    Renal/GU Renal InsufficiencyRenal disease     Musculoskeletal  (+) Arthritis ,   Abdominal   Peds  Hematology  (+) anemia ,   Anesthesia Other Findings   Reproductive/Obstetrics                           Anesthesia Physical Anesthesia Plan  ASA: III  Anesthesia Plan: General   Post-op Pain Management:    Induction: Intravenous  Airway Management Planned: Oral ETT  Additional Equipment:   Intra-op Plan:   Post-operative Plan: Extubation in OR  Informed Consent: I have reviewed the patients History and Physical, chart, labs and discussed the procedure including the risks, benefits and alternatives for the proposed anesthesia with the patient or authorized representative who has indicated his/her understanding and acceptance.   Dental advisory given  Plan Discussed with:   Anesthesia Plan Comments:  Anesthesia Quick Evaluation  

## 2015-12-01 NOTE — Anesthesia Procedure Notes (Signed)
Procedure Name: Intubation Date/Time: 12/01/2015 9:32 AM Performed by: Freddie Breech Pre-anesthesia Checklist: Patient identified, Emergency Drugs available, Suction available, Patient being monitored and Timeout performed Patient Re-evaluated:Patient Re-evaluated prior to inductionOxygen Delivery Method: Circle system utilized Preoxygenation: Pre-oxygenation with 100% oxygen Intubation Type: IV induction and Rapid sequence Laryngoscope Size: Mac and 3 Grade View: Grade II Tube type: Oral Tube size: 7.0 mm Number of attempts: 1 Airway Equipment and Method: Patient positioned with wedge pillow and Stylet Placement Confirmation: ETT inserted through vocal cords under direct vision,  positive ETCO2 and breath sounds checked- equal and bilateral Secured at: 22 cm Tube secured with: Tape Dental Injury: Teeth and Oropharynx as per pre-operative assessment

## 2015-12-01 NOTE — Interval H&P Note (Signed)
History and Physical Interval Note:  12/01/2015 7:43 AM  Claudia Lara  has presented today for surgery, with the diagnosis of cholelithiasis  The various methods of treatment have been discussed with the patient and family. After consideration of risks, benefits and other options for treatment, the patient has consented to  Procedure(s): ENDOSCOPIC RETROGRADE CHOLANGIOPANCREATOGRAPHY (ERCP) WITH PROPOFOL (N/A) as a surgical intervention .  The patient's history has been reviewed, patient examined, no change in status, stable for surgery.  I have reviewed the patient's chart and labs.  Questions were answered to the patient's satisfaction.     [name]

## 2015-12-01 NOTE — Op Note (Signed)
[facility] [place], [address]   ERCP PROCEDURE REPORT  PATIENT: Claudia Lara, Claudia Lara  MR#: [id number] BIRTHDATE: 05-20-1932  GENDER: female ENDOSCOPIST: [name], MD REFERRED BY: [name], MD PROCEDURE DATE:  12/01/2015 PROCEDURE:   ERCP with sphincterotomy/papillotomy and ERCP with removal of calculus/calculi INDICATIONS:CBD stone on recent US (and stones in GB); presented with pain, elevated liver tests;[name] MEDICATIONS: Per Anesthesia and cipro 400mg  IV, indomethacin 100mg  PR TOPICAL ANESTHETIC:   none  DESCRIPTION OF PROCEDURE:     Physical exam was performed.  Informed consent was obtained from the patient after explaining the benefits, risks, and alternatives to the procedure.  The patient was connected to the monitor and placed in the semi-prone position. IV medicine was administered through an indwelling cannula and oxygen via endotracheal tube.  After administration of sedation, the patients esophagus was intubated and the duodenoscope endoscope was advanced under direct visualization to the second portion of the duodenum without detailed evalution of the UGI tract.  The major papilla was normal.  A 44 autotome over a .035 hydrawire was used to cannulate the bile duct and contrast was injected. Cholangiogram showed that the CBD was slightly dilated (7-99mm) and it contained 2-3 round mobile filling defects, each about 31mm aross. The intrahepatic bile ducts were not dilated.  An adequate biliary sphincterotomy was performed and then the bile duct was swept several times with a biliary retrieval balloon. Two black stones and several small stone fragments were delivered into the duodenum.  There was no purulence.  A completion, occlusion cholangiogram showed no remaining filling defects.   The scope was then completely withdrawn from the patient and the procedure terminated.  The main pancreatic duct was never cannulated with wire or  injected with dye.     COMPLICATIONS:    None  ENDOSCOPIC IMPRESSION: Choledocholithiasis, treated with biliary sphincterotomy and balloon sweeping.  RECOMMENDATIONS: My office will set up general surgery evaluation to consider gallbladder resection. Continue cipro for one more day.   _______________________________ eSigned:  [name], MD 12/01/2015 9:56 AM

## 2015-12-01 NOTE — H&P (View-Only) (Signed)
Patient ID: Claudia Lara, female   DOB: 1932-05-04, 82 y.o.   MRN: [id number]     History of Present Illness: MICHALEA SCHUBACH  Is a delightful 82 year old female who was known to Dr. Ardis Hughs. She was seen in September 2016 2 valuate dark Hemoccult positive stools. She had had a colonoscopy in 2007 by Dr. Penelope Coop.  at which time she was found to have hyperplastic polyps, no adenomatous polyps. EGD by Dr. Ardis Hughs in 2010 for anemia showed mild gastritis hiatal hernia mild esophagitis , H. Pylori negative , biopsies negative for sprue. Colonoscopy by Dr. Ardis Hughs 10/10/2015 showed 4 sessile polyps ranging between 3-5 mm in size found in the transverse colon and ascending colon , polypectomies were performed with a cold snare. Pathology showed no adenomatous changes.   Envy states that several weeks ago she developed severe epigastric pain and nausea. She was evaluated by her PCP and  Noted to have scleral icterus. She had an abdominal ultrasound that showed gallstones, intrahepatic bile duct dilatation and findings concerning for choledocholithiasis. Common bile duct had increased caliber measuring 14 mm. 7 mm calculus identified within the common bile duct. Hepatic function panel showed a total bili of 4.8, direct bili 2.1, alkaline phosphatase 285, AST 123, ALT 175. Patient  States that at the time she had the pain and for several days later, her urine was dark brown. She states her pain is totally resolved and she has no nausea. Her urine is back to normal color. She has not had any fever, chills, or night sweats. She states she had a similar episode of pain earlier this year but it went away quickly.   Past Medical History  Diagnosis Date  . ANEMIA 08/22/2009  . Unspecified essential hypertension 10/19/2007  . CAROTID ARTERY STENOSIS 01/16/2010  . PVD 10/06/2007  . HEMORRHOIDS, INTERNAL     surgery was in the 90's (per patient)  . GEN OSTEOARTHROSIS INVOLVING MULTIPLE SITES 11/11/2008  . NECK PAIN,  ACUTE 12/28/2009  . PERIPHERAL EDEMA 10/06/2007  . DYSPNEA ON EXERTION 11/16/2007  . Gallstones     Past Surgical History  Procedure Laterality Date  . Hyperplastic colon polyps, removed  2007    By Dr. Penelope Coop  . Oophorectomy    . Rotator cuff repair  2004    left (Dr. Durward Fortes)  . Abdominal hysterectomy    . Joint replacement  2011    right knee  . Total knee arthroplasty Right    Family History  Problem Relation Age of Onset  . Ovarian cancer Mother   . Cancer Mother   . Leukemia Father   . Lung cancer Father   . Cancer Father   . Diabetes Neg Hx   . Heart disease Neg Hx   . Hypertension Neg Hx   . Cancer Brother    Social History  Substance Use Topics  . Smoking status: Never Smoker   . Smokeless tobacco: Never Used  . Alcohol Use: No   Current Outpatient Prescriptions  Medication Sig Dispense Refill  . Ascorbic Acid (VITAMIN C) 500 MG tablet Take 500 mg by mouth every morning.     . Calcium Carbonate-Vit D-Min (CALCIUM 600 + MINERALS) 600-200 MG-UNIT TABS Take 1 tablet by mouth every morning.     . carvedilol (COREG) 12.5 MG tablet TAKE 1 TABLET BY MOUTH 2 TIMES DAILY WITH A MEAL. 360 tablet 1  . cholecalciferol (VITAMIN D) 1000 UNITS tablet Take 1,000 Units by mouth every morning.     [name]  Glucosamine-Chondroit-Vit C-Mn (GLUCOSAMINE 1500 COMPLEX) CAPS Take 1 capsule by mouth at bedtime.     . Multiple Vitamins-Minerals (CENTRUM SILVER ADULT 50+) TABS Take 1 tablet by mouth every morning.    . pantoprazole (PROTONIX) 40 MG tablet Take 1 tablet (40 mg total) by mouth daily. 90 tablet 3  . ciprofloxacin (CIPRO) 250 MG tablet Take 1 tablet (250 mg total) by mouth 2 (two) times daily. (Patient taking differently: Take 250 mg by mouth 2 (two) times daily. Take for 5 days; starting 11/28/15) 10 tablet 0  . Polyethyl Glycol-Propyl Glycol (SYSTANE OP) Apply 1-2 drops to eye daily as needed (dry eyes).     No current facility-administered medications for this visit.   Allergies    Allergen Reactions  . Sulfonamide Derivatives Rash    Childhood reaction     Review of Systems: Gen: Denies any fever, chills, sweats, anorexia, fatigue, weakness, malaise, weight loss, and sleep disorder CV: Denies chest pain, angina, palpitations, syncope, orthopnea, PND, peripheral edema, and claudication. Resp: Denies dyspnea at rest, dyspnea with exercise, cough, sputum, wheezing, coughing up blood, and pleurisy. GI: Denies vomiting blood, jaundice, and fecal incontinence.   Denies dysphagia or odynophagia. GU : Denies urinary burning, blood in urine, urinary frequency, urinary hesitancy, nocturnal urination, and urinary incontinence. MS: Denies joint pain, limitation of movement, and swelling, stiffness, low back pain, extremity pain. Denies muscle weakness, cramps, atrophy.  Derm: Denies rash, itching, dry skin, hives, moles, warts, or unhealing ulcers.  Psych: Denies depression, anxiety, memory loss, suicidal ideation, hallucinations, paranoia, and confusion. Heme: Denies bruising, bleeding, and enlarged lymph nodes. Neuro:  Denies any headaches, dizziness, paresthesia Endo:  Denies any problems with DM, thyroid, adrenal   Studies:   US Abdomen Complete  11/22/2015  CLINICAL DATA:  Epigastric pain. EXAM: ABDOMEN ULTRASOUND COMPLETE COMPARISON:  None. FINDINGS: Gallbladder: Multiple stones are identified within the gallbladder. No gallbladder wall thickening or pericholecystic fluid. Common bile duct: Diameter: Increased caliber measuring 14 mm. 7 mm calculus is identified within the common bile duct. Liver: Intrahepatic bile duct dilatation. Parenchymal echogenicity is normal. No focal liver abnormality. IVC: No abnormality visualized. Pancreas: Visualized portion unremarkable. Spleen: Size and appearance within normal limits. Right Kidney: Length: 8.6 cm. Diffuse cortical thinning. Upper pole cyst measures 1.2 cm. No mass or hydronephrosis. Left Kidney: Length: 9.9 cm. Within the  inferior pole of the left kidney there is a small cyst measuring 2.2 cm. Echogenicity within normal limits. No mass or hydronephrosis visualized. Abdominal aorta: No aneurysm visualized. Other findings: None. IMPRESSION: 1. Gallstones, intrahepatic bile duct dilatation and findings concerning for choledocholithiasis. If further imaging is clinically indicated an MRCP may be helpful to evaluate the extent of choledocholithiasis. 2. Renal cysts. 3. Right renal atrophy. Electronically Signed   By: Kerby Moors M.D.   On: 11/22/2015 09:48     Physical Exam: BP 128/40 mmHg  Pulse 80  Ht 5\' 3"  (1.6 m)  Wt 172 lb 8 oz (78.245 kg)  BMI 30.56 kg/m2 General: Pleasant, well developed , female in no acute distress Head: Normocephalic and atraumatic Eyes:  sclerae  Mildly icteric, conjunctiva pink  Ears: Normal auditory acuity Lungs: Clear throughout to auscultation Heart: Regular rate and rhythm Abdomen: Soft, non distended, non-tender. No masses, no hepatomegaly. Normal bowel sounds Musculoskeletal: Symmetrical with no gross deformities  Extremities: No edema  Neurological: Alert oriented x 4, grossly nonfocal Psychological:  Alert and cooperative. Normal mood and affect  Assessment and Recommendations:   82 year old female status post  an episode of epigastric abdominal pain and nausea found to have common bile duct measuring 14 mm with a 7 mm calculus identified within the common bile duct, as well as intrahepatic ductal dilatation. Patient has been advised to undergo an ERCP.The risks, benefits, and possible complications of the procedure, including  But not limited to bleeding, perforation, surgery, and the 5-10% risk for pancreatitis, were explained to the patient.  It was explained that  post ERCP pancreatitis which, while usually is mild, occasionally maybe severe and even life-threatening.  Patient's questions were answered. The procedure will be scheduled with Dr. Ardis Hughs. In the meantime  patient has been given a prescription for Cipro 250 mg twice daily for 5 days. A repeat CBC, basic metabolic panel, lipase, hepatic function panel , and GGT will be obtained today. Patient has been instructed that if she experiences worsening pain or fever on the antibiotics she should go to the emergency room.       Georgene Kopper, Vita Barley PA-C 11/28/2015,

## 2015-12-01 NOTE — Discharge Instructions (Signed)

## 2015-12-01 NOTE — Telephone Encounter (Signed)
You have been scheduled for an appointment with Dr Sherrin Daisy at [facility]. Your appointment is on 12/14/15 at 11:15 am. Please arrive at 10:45 am for registration. Make certain to bring a list of current medications, including any over the counter medications or vitamins. Also bring your co-pay if you have one as well as your insurance cards. [facility] is located at [address]. Should you need to reschedule your appointment, please contact them at [phone].   Jill at [facility] was given the appt info to pass on to the pt this morning.  The pt is still in the Endo unit.

## 2015-12-01 NOTE — Transfer of Care (Addendum)
Immediate Anesthesia Transfer of Care Note  Patient: Claudia Lara  Procedure(s) Performed: Procedure(s): ENDOSCOPIC RETROGRADE CHOLANGIOPANCREATOGRAPHY (ERCP) WITH PROPOFOL (N/A)  Patient Location: PACU  Anesthesia Type:General  Level of Consciousness: patient cooperative, lethargic and responds to stimulation  Airway & Oxygen Therapy: Patient Spontanous Breathing and Patient connected to face mask oxygen  Post-op Assessment: Report given to RN, Post -op Vital signs reviewed and stable and Patient moving all extremities X 4  Post vital signs: stable  Last Vitals:  Filed Vitals:   12/01/15 0728 12/01/15 0951  BP: 146/47 146/63  Pulse: 69 78  Temp: 36.9 C 36.4 C  Resp: 17 17    Complications: No apparent anesthesia complications

## 2015-12-01 NOTE — Anesthesia Postprocedure Evaluation (Signed)
Anesthesia Post Note  Patient: Claudia Lara  Procedure(s) Performed: Procedure(s) (LRB): ENDOSCOPIC RETROGRADE CHOLANGIOPANCREATOGRAPHY (ERCP) WITH PROPOFOL (N/A)  Patient location during evaluation: PACU Anesthesia Type: General Level of consciousness: awake and alert Pain management: pain level controlled Vital Signs Assessment: post-procedure vital signs reviewed and stable Respiratory status: spontaneous breathing, nonlabored ventilation, respiratory function stable and patient connected to nasal cannula oxygen Cardiovascular status: blood pressure returned to baseline and stable Postop Assessment: no signs of nausea or vomiting Anesthetic complications: no    Last Vitals:  Filed Vitals:   12/01/15 1020 12/01/15 1030  BP: 173/63 184/62  Pulse: 66 63  Temp:    Resp: 17 12    Last Pain: There were no vitals filed for this visit.               Jethro Radke,JAMES TERRILL

## 2015-12-01 NOTE — Telephone Encounter (Signed)
-----   Message from [name], MD sent at 12/01/2015 10:07 AM EST ----- She needs referral to [facility] to consider GB resection for gallstones, recent CBD stones (removed today with ERCP)  thanks

## 2015-12-04 ENCOUNTER — Encounter (HOSPITAL_COMMUNITY): Payer: Self-pay | Admitting: Gastroenterology

## 2015-12-08 ENCOUNTER — Other Ambulatory Visit: Payer: Self-pay | Admitting: Internal Medicine

## 2015-12-14 DIAGNOSIS — K8021 Calculus of gallbladder without cholecystitis with obstruction: Secondary | ICD-10-CM | POA: Diagnosis not present

## 2015-12-22 ENCOUNTER — Telehealth: Payer: Self-pay

## 2015-12-22 NOTE — Telephone Encounter (Signed)
Pt clearance faxed to [facility] Attn: Ivor Costa, [place]

## 2016-01-16 ENCOUNTER — Encounter: Payer: Self-pay | Admitting: Internal Medicine

## 2016-01-19 NOTE — Telephone Encounter (Signed)
Can you help with this I am working out of the office today. Does she need to be fit in today?

## 2016-01-29 ENCOUNTER — Other Ambulatory Visit: Payer: Self-pay | Admitting: General Surgery

## 2016-01-29 DIAGNOSIS — K8070 Calculus of gallbladder and bile duct without cholecystitis without obstruction: Secondary | ICD-10-CM | POA: Diagnosis not present

## 2016-01-29 DIAGNOSIS — K8010 Calculus of gallbladder with chronic cholecystitis without obstruction: Secondary | ICD-10-CM | POA: Diagnosis not present

## 2016-01-29 HISTORY — PX: CHOLECYSTECTOMY: SHX55

## 2016-01-30 ENCOUNTER — Other Ambulatory Visit: Payer: Self-pay

## 2016-01-30 ENCOUNTER — Encounter (HOSPITAL_COMMUNITY): Payer: Self-pay | Admitting: *Deleted

## 2016-01-30 ENCOUNTER — Telehealth: Payer: Self-pay | Admitting: Gastroenterology

## 2016-01-30 DIAGNOSIS — K831 Obstruction of bile duct: Secondary | ICD-10-CM

## 2016-01-30 NOTE — Telephone Encounter (Signed)
Patty,  I just spoke with Dr. Adonis Housekeeper from [facility]. He did lap chole yesterday for [name] and found a pretty obvious retained stone in the distal CBD.  She has normal liver tests and feels fine, already at home. She needs ERCP for the retained stone.  This Thursday if possible, if not then during my hosp week next week is fine too.  She is expecting a call sometime today. Thanks  dj

## 2016-01-30 NOTE — Telephone Encounter (Signed)
Pt has been scheduled for 02/01/16 115 pm, Sharee Pimple will call back to confirm xray is available.  ERCP scheduled, pt instructed and medications reviewed.  Patient instructions mailed to home.  Patient to call with any questions or concerns.

## 2016-02-01 ENCOUNTER — Ambulatory Visit (HOSPITAL_COMMUNITY): Payer: Medicare Other | Admitting: Anesthesiology

## 2016-02-01 ENCOUNTER — Ambulatory Visit (HOSPITAL_COMMUNITY)
Admission: RE | Admit: 2016-02-01 | Discharge: 2016-02-01 | Disposition: A | Discharge status: Home / Self Care | Payer: Medicare Other | Source: Ambulatory Visit | Attending: Gastroenterology | Admitting: Gastroenterology

## 2016-02-01 ENCOUNTER — Encounter (HOSPITAL_COMMUNITY): Payer: Self-pay | Admitting: *Deleted

## 2016-02-01 ENCOUNTER — Encounter (HOSPITAL_COMMUNITY)
Admission: RE | Disposition: A | Discharge status: Home / Self Care | Payer: Self-pay | Source: Ambulatory Visit | Attending: Gastroenterology

## 2016-02-01 ENCOUNTER — Ambulatory Visit (HOSPITAL_COMMUNITY): Payer: Medicare Other

## 2016-02-01 DIAGNOSIS — Z96651 Presence of right artificial knee joint: Secondary | ICD-10-CM | POA: Insufficient documentation

## 2016-02-01 DIAGNOSIS — K8000 Calculus of gallbladder with acute cholecystitis without obstruction: Secondary | ICD-10-CM | POA: Diagnosis not present

## 2016-02-01 DIAGNOSIS — I10 Essential (primary) hypertension: Secondary | ICD-10-CM | POA: Insufficient documentation

## 2016-02-01 DIAGNOSIS — K8050 Calculus of bile duct without cholangitis or cholecystitis without obstruction: Secondary | ICD-10-CM | POA: Diagnosis not present

## 2016-02-01 DIAGNOSIS — K831 Obstruction of bile duct: Secondary | ICD-10-CM

## 2016-02-01 DIAGNOSIS — I739 Peripheral vascular disease, unspecified: Secondary | ICD-10-CM | POA: Diagnosis not present

## 2016-02-01 DIAGNOSIS — I129 Hypertensive chronic kidney disease with stage 1 through stage 4 chronic kidney disease, or unspecified chronic kidney disease: Secondary | ICD-10-CM | POA: Diagnosis not present

## 2016-02-01 DIAGNOSIS — M1990 Unspecified osteoarthritis, unspecified site: Secondary | ICD-10-CM | POA: Diagnosis not present

## 2016-02-01 DIAGNOSIS — N183 Chronic kidney disease, stage 3 (moderate): Secondary | ICD-10-CM | POA: Diagnosis not present

## 2016-02-01 HISTORY — PX: ENDOSCOPIC RETROGRADE CHOLANGIOPANCREATOGRAPHY (ERCP) WITH PROPOFOL: SHX5810

## 2016-02-01 SURGERY — ENDOSCOPIC RETROGRADE CHOLANGIOPANCREATOGRAPHY (ERCP) WITH PROPOFOL
Anesthesia: General

## 2016-02-01 MED ORDER — LIDOCAINE HCL (CARDIAC) 20 MG/ML IV SOLN
INTRAVENOUS | Status: AC
  Filled 2016-02-01: qty 5

## 2016-02-01 MED ORDER — LIDOCAINE HCL (CARDIAC) 20 MG/ML IV SOLN
INTRAVENOUS | Status: DC | PRN
  Administered 2016-02-01: 50 mg via INTRATRACHEAL

## 2016-02-01 MED ORDER — SODIUM CHLORIDE 0.9 % IV SOLN: INTRAVENOUS | Status: DC

## 2016-02-01 MED ORDER — CIPROFLOXACIN IN D5W 400 MG/200ML IV SOLN
400.0000 mg | Freq: Two times a day (BID) | INTRAVENOUS | Status: DC
  Administered 2016-02-01: 400 mg via INTRAVENOUS

## 2016-02-01 MED ORDER — CIPROFLOXACIN IN D5W 400 MG/200ML IV SOLN
INTRAVENOUS | Status: AC
Start: 2016-02-01 — End: 2016-02-01
  Filled 2016-02-01: qty 200

## 2016-02-01 MED ORDER — IOHEXOL 300 MG/ML  SOLN
INTRAMUSCULAR | Status: DC | PRN
  Administered 2016-02-01: 30 mL

## 2016-02-01 MED ORDER — GLUCAGON HCL RDNA (DIAGNOSTIC) 1 MG IJ SOLR
INTRAMUSCULAR | Status: AC
Start: 2016-02-01 — End: 2016-02-01
  Filled 2016-02-01: qty 1

## 2016-02-01 MED ORDER — SUCCINYLCHOLINE CHLORIDE 20 MG/ML IJ SOLN
INTRAMUSCULAR | Status: DC | PRN
  Administered 2016-02-01: 100 mg via INTRAVENOUS

## 2016-02-01 MED ORDER — PROPOFOL 10 MG/ML IV BOLUS
INTRAVENOUS | Status: DC | PRN
  Administered 2016-02-01: 140 mg via INTRAVENOUS

## 2016-02-01 MED ORDER — LACTATED RINGERS IV SOLN
INTRAVENOUS | Status: DC
  Administered 2016-02-01: 11:00:00 via INTRAVENOUS

## 2016-02-01 MED ORDER — PROPOFOL 10 MG/ML IV BOLUS
INTRAVENOUS | Status: AC
Start: 2016-02-01 — End: 2016-02-01
  Filled 2016-02-01: qty 20

## 2016-02-01 MED ORDER — INDOMETHACIN 50 MG RE SUPP
100.0000 mg | Freq: Once | RECTAL | Status: AC
  Administered 2016-02-01: 100 mg via RECTAL

## 2016-02-01 MED ORDER — INDOMETHACIN 50 MG RE SUPP
RECTAL | Status: AC
Start: 2016-02-01 — End: 2016-02-01
  Filled 2016-02-01: qty 2

## 2016-02-01 MED ORDER — ONDANSETRON HCL 4 MG/2ML IJ SOLN
INTRAMUSCULAR | Status: AC
  Filled 2016-02-01: qty 2

## 2016-02-01 MED ORDER — ONDANSETRON HCL 4 MG/2ML IJ SOLN
INTRAMUSCULAR | Status: DC | PRN
  Administered 2016-02-01: 4 mg via INTRAVENOUS

## 2016-02-01 NOTE — Anesthesia Procedure Notes (Signed)
Procedure Name: Intubation Date/Time: 02/01/2016 1:22 PM Performed by: Dione Booze Pre-anesthesia Checklist: Emergency Drugs available, Suction available, Patient being monitored and Patient identified Patient Re-evaluated:Patient Re-evaluated prior to inductionOxygen Delivery Method: Circle system utilized Preoxygenation: Pre-oxygenation with 100% oxygen Intubation Type: IV induction Laryngoscope Size: Mac and 4 Grade View: Grade II Tube type: Oral Tube size: 7.5 mm Number of attempts: 1 Airway Equipment and Method: Stylet Placement Confirmation: breath sounds checked- equal and bilateral,  ETT inserted through vocal cords under direct vision and positive ETCO2 Secured at: 21 cm Tube secured with: Tape Dental Injury: Teeth and Oropharynx as per pre-operative assessment

## 2016-02-01 NOTE — Discharge Instructions (Signed)

## 2016-02-01 NOTE — Anesthesia Postprocedure Evaluation (Signed)
Anesthesia Post Note  Patient: STASHIA GALVAO  Procedure(s) Performed: Procedure(s) (LRB): ENDOSCOPIC RETROGRADE CHOLANGIOPANCREATOGRAPHY (ERCP) WITH PROPOFOL (N/A)  Patient location during evaluation: PACU Anesthesia Type: General Level of consciousness: awake and alert Pain management: pain level controlled Vital Signs Assessment: post-procedure vital signs reviewed and stable Respiratory status: spontaneous breathing, nonlabored ventilation, respiratory function stable and patient connected to nasal cannula oxygen Cardiovascular status: blood pressure returned to baseline and stable Postop Assessment: no signs of nausea or vomiting Anesthetic complications: no    Last Vitals:  Filed Vitals:   02/01/16 1116 02/01/16 1404  BP: 138/49 158/52  Pulse: 79   Temp: 36.9 C 36.6 C  Resp: 23 24    Last Pain:  Filed Vitals:   02/01/16 1407  PainSc: 2                  Becky Colan S

## 2016-02-01 NOTE — H&P (Signed)
  HPI: This is a woman with stone on IOC during recent lap chole, s.p ERCP 1-2 months ago  Chief complaint is CBD stone on IOC   Past Medical History  Diagnosis Date  . ANEMIA 08/22/2009  . Unspecified essential hypertension 10/19/2007  . CAROTID ARTERY STENOSIS 01/16/2010  . PVD 10/06/2007  . HEMORRHOIDS, INTERNAL     surgery was in the 90's (per patient)  . GEN OSTEOARTHROSIS INVOLVING MULTIPLE SITES 11/11/2008  . NECK PAIN, ACUTE 12/28/2009  . PERIPHERAL EDEMA 10/06/2007  . DYSPNEA ON EXERTION 11/16/2007  . Gallstones   . Complication of anesthesia     very hard to wake up from surgery    Past Surgical History  Procedure Laterality Date  . Hyperplastic colon polyps, removed  2007    By Dr. Penelope Coop  . Oophorectomy    . Rotator cuff repair  2004    left (Dr. Durward Fortes)  . Abdominal hysterectomy    . Joint replacement  2011    right knee  . Total knee arthroplasty Right   . Endoscopic retrograde cholangiopancreatography (ercp) with propofol N/A 12/01/2015    Procedure: ENDOSCOPIC RETROGRADE CHOLANGIOPANCREATOGRAPHY (ERCP) WITH PROPOFOL;  Surgeon: [name], MD;  Location: [facility];  Service: Endoscopy;  Laterality: N/A;  . Cholecystectomy  01/29/2016    at [facility]    Current Facility-Administered Medications  Medication Dose Route Frequency Provider Last Rate Last Dose  . 0.9 %  sodium chloride infusion   Intravenous Continuous [name], MD      . ciprofloxacin (CIPRO) IVPB 400 mg  400 mg Intravenous Q12H [name], MD      . lactated ringers infusion   Intravenous Continuous [name], MD        Allergies as of 01/30/2016 - Review Complete 01/30/2016  Allergen Reaction Noted  . Sulfonamide derivatives Rash     Family History  Problem Relation Age of Onset  . Ovarian cancer Mother   . Cancer Mother   . Leukemia Father   . Lung cancer Father   . Cancer Father   . Diabetes Neg Hx   . Heart disease Neg Hx   .  Hypertension Neg Hx   . Cancer Brother     Social History   Social History  . Marital Status: Divorced    Spouse Name: N/A  . Number of Children: 1  . Years of Education: 12   Occupational History  . Network engineer     retired   Social History Main Topics  . Smoking status: Never Smoker   . Smokeless tobacco: Never Used  . Alcohol Use: No  . Drug Use: No  . Sexual Activity: Not Currently   Other Topics Concern  . Not on file   Social History Narrative   HSG. Married - divorced for many years.  Retired. Lives alone - very active and independent.     Physical Exam: BP 138/49 mmHg  Pulse 79  Temp(Src) 98.4 F (36.9 C) (Oral)  Resp 23  SpO2 97% Constitutional: generally well-appearing Psychiatric: alert and oriented x3 Abdomen: soft, nontender, nondistended, no obvious ascites, no peritoneal signs, normal bowel sounds   Assessment and plan: 82 y.o. female with CBD stone on IOC  For ERCP today.   Owens Loffler, MD [facility] 02/01/2016, 12:51 PM

## 2016-02-01 NOTE — Op Note (Signed)
[facility] Patient Name: Claudia Lara Procedure Date: 02/01/2016 MRN: [id number] Attending MD: [name] , MD Date of Birth: 1932/07/16 CSN:  Age: 82 Admit Type: Outpatient Account #: [id number] Procedure:                ERCP Indications:              Bile duct stone(s) seen on recent IOC; ERCP 1 month                            ago (Dr. Ardis Hughs) with biliary sphincterotomy and                            stone removal Providers:                [name], MD, Cleda Daub, RN, Alfonso Patten, Technician, Dione Booze, CRNA Referring MD:             Adonis Housekeeper, MD Medicines:                General Anesthesia, Indomethacin 100 mg PR, Cipro                            [id number] mg IV Complications:            No immediate complications. Estimated Blood Loss:     Estimated blood loss: none. Procedure:                Pre-Anesthesia Assessment:                           - Prior to the procedure, a History and Physical                            was performed, and patient medications and                            allergies were reviewed. The patient's tolerance of                            previous anesthesia was also reviewed. The risks                            and benefits of the procedure and the sedation                            options and risks were discussed with the patient.                            All questions were answered, and informed consent                            was obtained. Prior Anticoagulants: The patient has  taken no previous anticoagulant or antiplatelet                            agents. ASA Grade Assessment: II - A patient with                            mild systemic disease. After reviewing the risks                            and benefits, the patient was deemed in                            satisfactory condition to undergo the procedure.                           After  obtaining informed consent, the scope was                            passed under direct vision. Throughout the                            procedure, the patient's blood pressure, pulse, and                            oxygen saturations were monitored continuously. The                            [id number]) scope was introduced through                            the mouth, and used to inject contrast into and                            used to inject contrast into the bile duct. The                            ERCP was accomplished without difficulty. The                            patient tolerated the procedure well. Scope In: Scope Out: Findings:      A scout film of the abdomen was obtained. Surgical clips, consistent       with a previous cholecystectomy, were seen in the area of the right       upper quadrant of the abdomen. The esophagus was successfully intubated       under direct vision without detailed examination of the pharynx, larynx,       and associated structures, and upper GI tract. The upper GI tract was       grossly normal. A biliary sphincterotomy had been performed. The       sphincterotomy appeared a bit small. Biliary sphincterotomy was made       with a Autotome sphincterotome. There was no post-sphincterotomy       bleeding. The lower third of the main bile duct contained two stones,  the largest of which was 6 mm in diameter. The biliary tree was swept       with a 12 mm balloon starting at the bifurcation. Sludge was swept from       the duct. All stones were removed. Impression:               - Prior biliary endoscopic sphincterotomy appeared                            a bit small.                           - Choledocholithiasis was found. Complete removal                            was accomplished by biliary sphincterotomy and                            balloon extraction.                           - The biliary tree was swept with retrieval  ballon                            delivering sludge, stone fragments into the                            duodenum. There was no purulence.. Moderate Sedation:      N/A- Per Anesthesia Care Recommendation:           - Watch for pancreatitis, bleeding, perforation,                            and cholangitis. Procedure Code(s):        --- Professional ---                           [phone], Endoscopic retrograde                            cholangiopancreatography (ERCP); with removal of                            calculi/debris from biliary/pancreatic duct(s)                           [id number], Endoscopic retrograde                            cholangiopancreatography (ERCP); with                            sphincterotomy/papillotomy Diagnosis Code(s):        --- Professional ---                           K80.50, Calculus of bile duct without cholangitis  or cholecystitis without obstruction CPT copyright 2016 American Medical Association. All rights reserved. The codes documented in this report are preliminary and upon coder review may  be revised to meet current compliance requirements. [name], MD [name], MD 02/01/2016 1:56:54 PM Number of Addenda: 0

## 2016-02-01 NOTE — Transfer of Care (Signed)
Immediate Anesthesia Transfer of Care Note  Patient: Claudia Lara  Procedure(s) Performed: Procedure(s): ENDOSCOPIC RETROGRADE CHOLANGIOPANCREATOGRAPHY (ERCP) WITH PROPOFOL (N/A)  Patient Location: PACU and Endoscopy Unit  Anesthesia Type:MAC  Level of Consciousness: awake and patient cooperative  Airway & Oxygen Therapy: Patient Spontanous Breathing and Patient connected to nasal cannula oxygen  Post-op Assessment: Report given to RN and Post -op Vital signs reviewed and stable  Post vital signs: Reviewed and stable  Last Vitals:  Filed Vitals:   02/01/16 1116  BP: 138/49  Pulse: 79  Temp: 36.9 C  Resp: 23    Complications: No apparent anesthesia complications

## 2016-02-01 NOTE — Anesthesia Preprocedure Evaluation (Signed)
Anesthesia Evaluation  Patient identified by MRN, date of birth, ID band Patient awake    Reviewed: Allergy & Precautions, NPO status , Patient's Chart, lab work & pertinent test results  Airway Mallampati: II  TM Distance: >3 FB Neck ROM: Full    Dental no notable dental hx.    Pulmonary neg pulmonary ROS,    Pulmonary exam normal breath sounds clear to auscultation       Cardiovascular hypertension, Pt. on medications + Peripheral Vascular Disease  Normal cardiovascular exam+ Valvular Problems/Murmurs AS  Rhythm:Regular Rate:Normal  Mild AS   Neuro/Psych negative neurological ROS  negative psych ROS   GI/Hepatic negative GI ROS, Neg liver ROS,   Endo/Other  negative endocrine ROS  Renal/GU Renal InsufficiencyRenal disease  negative genitourinary   Musculoskeletal negative musculoskeletal ROS (+)   Abdominal   Peds negative pediatric ROS (+)  Hematology negative hematology ROS (+)   Anesthesia Other Findings   Reproductive/Obstetrics negative OB ROS                             Anesthesia Physical Anesthesia Plan  ASA: III  Anesthesia Plan: General   Post-op Pain Management:    Induction: Intravenous  Airway Management Planned: Oral ETT  Additional Equipment:   Intra-op Plan:   Post-operative Plan: Extubation in OR  Informed Consent: I have reviewed the patients History and Physical, chart, labs and discussed the procedure including the risks, benefits and alternatives for the proposed anesthesia with the patient or authorized representative who has indicated his/her understanding and acceptance.   Dental advisory given  Plan Discussed with: CRNA and Surgeon  Anesthesia Plan Comments:         Anesthesia Quick Evaluation

## 2016-02-07 ENCOUNTER — Encounter (HOSPITAL_COMMUNITY): Payer: Self-pay | Admitting: Gastroenterology

## 2016-02-07 DIAGNOSIS — K8021 Calculus of gallbladder without cholecystitis with obstruction: Secondary | ICD-10-CM | POA: Diagnosis not present

## 2016-02-19 DIAGNOSIS — L570 Actinic keratosis: Secondary | ICD-10-CM | POA: Diagnosis not present

## 2016-02-19 DIAGNOSIS — L820 Inflamed seborrheic keratosis: Secondary | ICD-10-CM | POA: Diagnosis not present

## 2016-03-05 ENCOUNTER — Encounter: Payer: Self-pay | Admitting: Internal Medicine

## 2016-03-05 ENCOUNTER — Other Ambulatory Visit (INDEPENDENT_AMBULATORY_CARE_PROVIDER_SITE_OTHER): Payer: Medicare Other

## 2016-03-05 ENCOUNTER — Ambulatory Visit (INDEPENDENT_AMBULATORY_CARE_PROVIDER_SITE_OTHER): Payer: Medicare Other | Admitting: Internal Medicine

## 2016-03-05 VITALS — BP 144/70 | HR 60 | Temp 99.0°F | Resp 16 | Ht 63.0 in | Wt 170.0 lb

## 2016-03-05 DIAGNOSIS — I10 Essential (primary) hypertension: Secondary | ICD-10-CM

## 2016-03-05 DIAGNOSIS — N183 Chronic kidney disease, stage 3 unspecified: Secondary | ICD-10-CM

## 2016-03-05 DIAGNOSIS — M159 Polyosteoarthritis, unspecified: Secondary | ICD-10-CM

## 2016-03-05 DIAGNOSIS — R739 Hyperglycemia, unspecified: Secondary | ICD-10-CM | POA: Diagnosis not present

## 2016-03-05 DIAGNOSIS — D638 Anemia in other chronic diseases classified elsewhere: Secondary | ICD-10-CM | POA: Diagnosis not present

## 2016-03-05 LAB — BASIC METABOLIC PANEL
BUN: 24 mg/dL — ABNORMAL HIGH (ref 6–23)
CHLORIDE: 107 meq/L (ref 96–112)
CO2: 28 meq/L (ref 19–32)
Calcium: 9.4 mg/dL (ref 8.4–10.5)
Creatinine, Ser: 1.08 mg/dL (ref 0.40–1.20)
GFR: 51.63 mL/min — ABNORMAL LOW (ref >60.00)
Glucose, Bld: 89 mg/dL (ref 70–99)
POTASSIUM: 5 meq/L (ref 3.5–5.1)
SODIUM: 140 meq/L (ref 135–145)

## 2016-03-05 LAB — CBC WITH DIFFERENTIAL/PLATELET
BASOS ABS: 0 10*3/uL (ref 0.0–0.1)
Basophils Relative: 0.3 % (ref 0.0–3.0)
EOS ABS: 0.2 10*3/uL (ref 0.0–0.7)
EOS PCT: 3.3 % (ref 0.0–5.0)
HCT: 28.3 % — ABNORMAL LOW (ref 36.0–46.0)
Hemoglobin: 9.7 g/dL — ABNORMAL LOW (ref 12.0–15.0)
LYMPHS ABS: 1.3 10*3/uL (ref 0.7–4.0)
Lymphocytes Relative: 17.5 % (ref 12.0–46.0)
MCHC: 34.4 g/dL (ref 30.0–36.0)
MCV: 88.3 fl (ref 78.0–100.0)
MONOS PCT: 8.8 % (ref 3.0–12.0)
Monocytes Absolute: 0.6 10*3/uL (ref 0.1–1.0)
NEUTROS ABS: 5.2 10*3/uL (ref 1.4–7.7)
NEUTROS PCT: 70.1 % (ref 43.0–77.0)
PLATELETS: 219 10*3/uL (ref 150.0–400.0)
RBC: 3.21 Mil/uL — AB (ref 3.87–5.11)
RDW: 19.9 % — ABNORMAL HIGH (ref 11.5–15.5)
WBC: 7.4 10*3/uL (ref 4.0–10.5)

## 2016-03-05 LAB — TSH: TSH: 0.96 u[IU]/mL (ref 0.35–4.50)

## 2016-03-05 LAB — HEMOGLOBIN A1C: HEMOGLOBIN A1C: 5.2 % (ref 4.6–6.5)

## 2016-03-05 MED ORDER — MELOXICAM 7.5 MG PO TABS: 7.5000 mg | ORAL_TABLET | Freq: Every day | ORAL | Status: DC

## 2016-03-05 NOTE — Patient Instructions (Signed)
Hypertension Hypertension, commonly called high blood pressure, is when the force of blood pumping through your arteries is too strong. Your arteries are the blood vessels that carry blood from your heart throughout your body. A blood pressure reading consists of a higher number over a lower number, such as 110/72. The higher number (systolic) is the pressure inside your arteries when your heart pumps. The lower number (diastolic) is the pressure inside your arteries when your heart relaxes. Ideally you want your blood pressure below 120/80. Hypertension forces your heart to work harder to pump blood. Your arteries may become narrow or stiff. Having untreated or uncontrolled hypertension can cause heart attack, stroke, kidney disease, and other problems. RISK FACTORS Some risk factors for high blood pressure are controllable. Others are not.  Risk factors you cannot control include:   Race. You may be at higher risk if you are African American.  Age. Risk increases with age.  Gender. Men are at higher risk than women before age 45 years. After age 65, women are at higher risk than men. Risk factors you can control include:  Not getting enough exercise or physical activity.  Being overweight.  Getting too much fat, sugar, calories, or salt in your diet.  Drinking too much alcohol. SIGNS AND SYMPTOMS Hypertension does not usually cause signs or symptoms. Extremely high blood pressure (hypertensive crisis) may cause headache, anxiety, shortness of breath, and nosebleed. DIAGNOSIS To check if you have hypertension, your health care provider will measure your blood pressure while you are seated, with your arm held at the level of your heart. It should be measured at least twice using the same arm. Certain conditions can cause a difference in blood pressure between your right and left arms. A blood pressure reading that is higher than normal on one occasion does not mean that you need treatment. If  it is not clear whether you have high blood pressure, you may be asked to return on a different day to have your blood pressure checked again. Or, you may be asked to monitor your blood pressure at home for 1 or more weeks. TREATMENT Treating high blood pressure includes making lifestyle changes and possibly taking medicine. Living a healthy lifestyle can help lower high blood pressure. You may need to change some of your habits. Lifestyle changes may include:  Following the DASH diet. This diet is high in fruits, vegetables, and whole grains. It is low in salt, red meat, and added sugars.  Keep your sodium intake below 2,300 mg per day.  Getting at least 30-45 minutes of aerobic exercise at least 4 times per week.  Losing weight if necessary.  Not smoking.  Limiting alcoholic beverages.  Learning ways to reduce stress. Your health care provider may prescribe medicine if lifestyle changes are not enough to get your blood pressure under control, and if one of the following is true:  You are 18-59 years of age and your systolic blood pressure is above 140.  You are 60 years of age or older, and your systolic blood pressure is above 150.  Your diastolic blood pressure is above 90.  You have diabetes, and your systolic blood pressure is over 140 or your diastolic blood pressure is over 90.  You have kidney disease and your blood pressure is above 140/90.  You have heart disease and your blood pressure is above 140/90. Your personal target blood pressure may vary depending on your medical conditions, your age, and other factors. HOME CARE INSTRUCTIONS    Have your blood pressure rechecked as directed by your health care provider.   Take medicines only as directed by your health care provider. Follow the directions carefully. Blood pressure medicines must be taken as prescribed. The medicine does not work as well when you skip doses. Skipping doses also puts you at risk for  problems.  Do not smoke.   Monitor your blood pressure at home as directed by your health care provider. SEEK MEDICAL CARE IF:   You think you are having a reaction to medicines taken.  You have recurrent headaches or feel dizzy.  You have swelling in your ankles.  You have trouble with your vision. SEEK IMMEDIATE MEDICAL CARE IF:  You develop a severe headache or confusion.  You have unusual weakness, numbness, or feel faint.  You have severe chest or abdominal pain.  You vomit repeatedly.  You have trouble breathing. MAKE SURE YOU:   Understand these instructions.  Will watch your condition.  Will get help right away if you are not doing well or get worse.   This information is not intended to replace advice given to you by your health care provider. Make sure you discuss any questions you have with your health care provider.   Document Released: 11/11/2005 Document Revised: 03/28/2015 Document Reviewed: 09/03/2013 Elsevier Interactive Patient Education 2016 Elsevier Inc.  

## 2016-03-05 NOTE — Progress Notes (Signed)
Subjective:  Patient ID: Claudia Lara, female    DOB: 06/01/1932  Age: 82 y.o. MRN: [id number]  CC: Osteoarthritis; Back Pain; and Anemia   HPI Claudia Lara presents for follow-up on joint and back pain. She recently had to stop taking anti-inflammatories because she underwent an upper GI procedure. She had some retained gallstones. That has resolved. She is complaining of pain in her back and knees/fingers and wants to restart an anti-inflammatory. She otherwise feels well with no nausea, vomiting, abdominal pain, constipation, or diarrhea.  Outpatient Prescriptions Prior to Visit  Medication Sig Dispense Refill  . Ascorbic Acid (VITAMIN C) 500 MG tablet Take 500 mg by mouth every morning.     . Calcium Carbonate-Vit D-Min (CALCIUM 600 + MINERALS) 600-200 MG-UNIT TABS Take 1 tablet by mouth every morning.     . carvedilol (COREG) 12.5 MG tablet TAKE 1 TABLET BY MOUTH 2 TIMES DAILY WITH A MEAL. 180 tablet 1  . cholecalciferol (VITAMIN D) 1000 UNITS tablet Take 1,000 Units by mouth every morning.     . Glucosamine-Chondroit-Vit C-Mn (GLUCOSAMINE 1500 COMPLEX) CAPS Take 1 capsule by mouth at bedtime.     . Multiple Vitamins-Minerals (CENTRUM SILVER ADULT 50+) TABS Take 1 tablet by mouth every morning.    . pantoprazole (PROTONIX) 40 MG tablet Take 1 tablet (40 mg total) by mouth daily. 90 tablet 3  . Polyethyl Glycol-Propyl Glycol (SYSTANE OP) Apply 1-2 drops to eye daily as needed (dry eyes).    . traMADol (ULTRAM) 50 MG tablet Take 50 mg by mouth every 6 (six) hours as needed for moderate pain.     No facility-administered medications prior to visit.    ROS Review of Systems  Constitutional: Negative.  Negative for fever, chills, diaphoresis, appetite change and fatigue.  HENT: Negative.   Eyes: Negative.   Respiratory: Negative.  Negative for cough, choking, chest tightness, shortness of breath and stridor.   Cardiovascular: Negative.  Negative for chest pain, palpitations and  leg swelling.  Gastrointestinal: Negative.  Negative for nausea, vomiting, abdominal pain, diarrhea and constipation.  Endocrine: Negative.   Genitourinary: Negative.  Negative for difficulty urinating.  Musculoskeletal: Positive for back pain and arthralgias. Negative for myalgias, joint swelling and neck pain.  Skin: Negative.  Negative for color change and rash.  Allergic/Immunologic: Negative.   Neurological: Negative.   Hematological: Negative.  Negative for adenopathy. Does not bruise/bleed easily.  Psychiatric/Behavioral: Negative.     Objective:  BP 144/70 mmHg  Pulse 60  Temp(Src) 99 F (37.2 C) (Oral)  Resp 16  Ht 5\' 3"  (1.6 m)  Wt 170 lb (77.111 kg)  BMI 30.12 kg/m2  SpO2 98%  BP Readings from Last 3 Encounters:  03/05/16 144/70  02/01/16 195/61  12/01/15 191/61    Wt Readings from Last 3 Encounters:  03/05/16 170 lb (77.111 kg)  12/01/15 172 lb (78.019 kg)  11/28/15 172 lb 8 oz (78.245 kg)    Physical Exam  Constitutional: She is oriented to person, place, and time. No distress.  HENT:  Mouth/Throat: Oropharynx is clear and moist. No oropharyngeal exudate.  Eyes: Conjunctivae are normal. Right eye exhibits no discharge. Left eye exhibits no discharge. No scleral icterus.  Neck: Normal range of motion. Neck supple. No JVD present. No tracheal deviation present. No thyromegaly present.  Cardiovascular: Normal rate, regular rhythm, normal heart sounds and intact distal pulses.  Exam reveals no gallop and no friction rub.   No murmur heard. Pulmonary/Chest: Effort normal and  breath sounds normal. No stridor. No respiratory distress. She has no wheezes. She has no rales. She exhibits no tenderness.  Abdominal: Soft. Bowel sounds are normal. She exhibits no distension and no mass. There is no tenderness. There is no rebound and no guarding.  Musculoskeletal: Normal range of motion. She exhibits no edema or tenderness.  Lymphadenopathy:    She has no cervical  adenopathy.  Neurological: She is oriented to person, place, and time.  Skin: Skin is warm and dry. No rash noted. She is not diaphoretic. No erythema. No pallor.  Vitals reviewed.   Lab Results  Component Value Date   WBC 7.4 03/05/2016   HGB 9.7* 03/05/2016   HCT 28.3* 03/05/2016   PLT 219.0 03/05/2016   GLUCOSE 89 03/05/2016   CHOL 136 05/15/2015   TRIG 195.0* 05/15/2015   HDL 48.30 05/15/2015   LDLCALC 49 05/15/2015   ALT 26 11/28/2015   AST 26 11/28/2015   NA 140 03/05/2016   K 5.0 03/05/2016   CL 107 03/05/2016   CREATININE 1.08 03/05/2016   BUN 24* 03/05/2016   CO2 28 03/05/2016   TSH 0.96 03/05/2016   INR 1.3* 11/09/2015   HGBA1C 5.2 03/05/2016    Dg Ercp Biliary & Pancreatic Ducts  02/01/2016  CLINICAL DATA:  choledocholithiasis EXAM: ERCP TECHNIQUE: Multiple spot images obtained with the fluoroscopic device and submitted for interpretation post-procedure. FLUOROSCOPY TIME:  1 minutes 53 seconds, 34 mGy COMPARISON:  12/03/2015 FINDINGS: A single fluoroscopic spot images submitted. There is incomplete opacification of intrahepatic and extrahepatic biliary tree. Surgical clips in the gallbladder fossa. Filling defects are suspected in the distal CBD. IMPRESSION: See above. These images were submitted for radiologic interpretation only. Please see the procedural report for the amount of contrast and the fluoroscopy time utilized. Electronically Signed   By: Lucrezia Europe M.D.   On: 02/01/2016 14:08    Assessment & Plan:   Desa was seen today for osteoarthritis, back pain and anemia.  Diagnoses and all orders for this visit:  Essential hypertension- Blood pressure is well-controlled, electrolytes and renal function are stable. -     Basic metabolic panel; Future -     TSH; Future  GEN OSTEOARTHROSIS INVOLVING MULTIPLE SITES- she will restart an anti-inflammatory -     meloxicam (MOBIC) 7.5 MG tablet; Take 1 tablet (7.5 mg total) by mouth daily.  Kidney disease,  chronic, stage III (GFR 30-59 ml/min)- her renal function has improved, I will monitor her closely since she is starting an anti-inflammatory. -     CBC with Differential/Platelet; Future -     Basic metabolic panel; Future  Anemia of chronic disease- improvement noted, she has a history of hemolytic anemia and now has anemia of chronic disease. -     CBC with Differential/Platelet; Future  Hyperglycemia- improvement noted -     Basic metabolic panel; Future -     Hemoglobin A1c; Future   I am having Ms. Brotz start on meloxicam. I am also having her maintain her CALCIUM 600 + MINERALS, GLUCOSAMINE 1500 COMPLEX, vitamin C, cholecalciferol, CENTRUM SILVER ADULT 50+, pantoprazole, Polyethyl Glycol-Propyl Glycol (SYSTANE OP), carvedilol, and traMADol.  Meds ordered this encounter  Medications  . meloxicam (MOBIC) 7.5 MG tablet    Sig: Take 1 tablet (7.5 mg total) by mouth daily.    Dispense:  90 tablet    Refill:  1     Follow-up: Return in about 4 months (around 07/05/2016).  [name], MD

## 2016-03-05 NOTE — Progress Notes (Signed)
Pre visit review using our clinic review tool, if applicable. No additional management support is needed unless otherwise documented below in the visit note. 

## 2016-03-08 ENCOUNTER — Encounter: Payer: Self-pay | Admitting: Internal Medicine

## 2016-03-11 ENCOUNTER — Other Ambulatory Visit: Payer: Self-pay | Admitting: Internal Medicine

## 2016-03-11 DIAGNOSIS — M159 Polyosteoarthritis, unspecified: Secondary | ICD-10-CM

## 2016-05-21 DIAGNOSIS — L814 Other melanin hyperpigmentation: Secondary | ICD-10-CM | POA: Diagnosis not present

## 2016-05-21 DIAGNOSIS — L821 Other seborrheic keratosis: Secondary | ICD-10-CM | POA: Diagnosis not present

## 2016-05-21 DIAGNOSIS — L820 Inflamed seborrheic keratosis: Secondary | ICD-10-CM | POA: Diagnosis not present

## 2016-06-06 ENCOUNTER — Encounter: Payer: Self-pay | Admitting: Internal Medicine

## 2016-06-06 ENCOUNTER — Other Ambulatory Visit (INDEPENDENT_AMBULATORY_CARE_PROVIDER_SITE_OTHER): Payer: Medicare Other

## 2016-06-06 ENCOUNTER — Other Ambulatory Visit: Payer: Self-pay | Admitting: Internal Medicine

## 2016-06-06 DIAGNOSIS — R300 Dysuria: Secondary | ICD-10-CM | POA: Diagnosis not present

## 2016-06-06 DIAGNOSIS — N3000 Acute cystitis without hematuria: Secondary | ICD-10-CM | POA: Insufficient documentation

## 2016-06-06 LAB — URINALYSIS, ROUTINE W REFLEX MICROSCOPIC
Bilirubin Urine: NEGATIVE
KETONES UR: NEGATIVE
NITRITE: POSITIVE — AB
PH: 6.5 (ref 5.0–8.0)
SPECIFIC GRAVITY, URINE: 1.015 (ref 1.000–1.030)
TOTAL PROTEIN, URINE-UPE24: 30 — AB
URINE GLUCOSE: NEGATIVE
UROBILINOGEN UA: 0.2 (ref 0.0–1.0)

## 2016-06-06 MED ORDER — NITROFURANTOIN MONOHYD MACRO 100 MG PO CAPS: 100.0000 mg | ORAL_CAPSULE | Freq: Two times a day (BID) | ORAL | Status: DC

## 2016-06-07 ENCOUNTER — Other Ambulatory Visit: Payer: Self-pay | Admitting: Internal Medicine

## 2016-06-08 LAB — CULTURE, URINE COMPREHENSIVE: Colony Count: 80000

## 2016-06-11 DIAGNOSIS — S300XXA Contusion of lower back and pelvis, initial encounter: Secondary | ICD-10-CM | POA: Diagnosis not present

## 2016-07-02 DIAGNOSIS — H43812 Vitreous degeneration, left eye: Secondary | ICD-10-CM | POA: Diagnosis not present

## 2016-07-02 DIAGNOSIS — H31009 Unspecified chorioretinal scars, unspecified eye: Secondary | ICD-10-CM | POA: Diagnosis not present

## 2016-07-02 DIAGNOSIS — H2512 Age-related nuclear cataract, left eye: Secondary | ICD-10-CM | POA: Diagnosis not present

## 2016-07-02 DIAGNOSIS — H04121 Dry eye syndrome of right lacrimal gland: Secondary | ICD-10-CM | POA: Diagnosis not present

## 2016-07-02 DIAGNOSIS — H33302 Unspecified retinal break, left eye: Secondary | ICD-10-CM | POA: Diagnosis not present

## 2016-07-02 DIAGNOSIS — H2511 Age-related nuclear cataract, right eye: Secondary | ICD-10-CM | POA: Diagnosis not present

## 2016-07-09 ENCOUNTER — Ambulatory Visit (INDEPENDENT_AMBULATORY_CARE_PROVIDER_SITE_OTHER): Payer: Medicare Other | Admitting: Internal Medicine

## 2016-07-09 ENCOUNTER — Encounter: Payer: Self-pay | Admitting: Internal Medicine

## 2016-07-09 ENCOUNTER — Other Ambulatory Visit (INDEPENDENT_AMBULATORY_CARE_PROVIDER_SITE_OTHER): Payer: Medicare Other

## 2016-07-09 VITALS — BP 140/80 | HR 61 | Temp 98.2°F | Ht 63.0 in | Wt 169.0 lb

## 2016-07-09 DIAGNOSIS — D638 Anemia in other chronic diseases classified elsewhere: Secondary | ICD-10-CM | POA: Diagnosis not present

## 2016-07-09 DIAGNOSIS — N3000 Acute cystitis without hematuria: Secondary | ICD-10-CM

## 2016-07-09 DIAGNOSIS — I10 Essential (primary) hypertension: Secondary | ICD-10-CM

## 2016-07-09 DIAGNOSIS — N183 Chronic kidney disease, stage 3 unspecified: Secondary | ICD-10-CM

## 2016-07-09 DIAGNOSIS — I6523 Occlusion and stenosis of bilateral carotid arteries: Secondary | ICD-10-CM | POA: Diagnosis not present

## 2016-07-09 DIAGNOSIS — E785 Hyperlipidemia, unspecified: Secondary | ICD-10-CM

## 2016-07-09 LAB — URINALYSIS, ROUTINE W REFLEX MICROSCOPIC
BILIRUBIN URINE: NEGATIVE
HGB URINE DIPSTICK: NEGATIVE
KETONES UR: NEGATIVE
NITRITE: NEGATIVE
Specific Gravity, Urine: 1.02 (ref 1.000–1.030)
Urine Glucose: NEGATIVE
Urobilinogen, UA: 0.2 (ref 0.0–1.0)
pH: 6.5 (ref 5.0–8.0)

## 2016-07-09 LAB — CBC WITH DIFFERENTIAL/PLATELET
BASOS PCT: 0.6 % (ref 0.0–3.0)
Basophils Absolute: 0 10*3/uL (ref 0.0–0.1)
EOS PCT: 7 % — AB (ref 0.0–5.0)
Eosinophils Absolute: 0.6 10*3/uL (ref 0.0–0.7)
HEMATOCRIT: 29.1 % — AB (ref 36.0–46.0)
HEMOGLOBIN: 10.1 g/dL — AB (ref 12.0–15.0)
Lymphocytes Relative: 16 % (ref 12.0–46.0)
Lymphs Abs: 1.3 10*3/uL (ref 0.7–4.0)
MCHC: 34.6 g/dL (ref 30.0–36.0)
MCV: 90.2 fl (ref 78.0–100.0)
MONO ABS: 0.7 10*3/uL (ref 0.1–1.0)
Monocytes Relative: 8.2 % (ref 3.0–12.0)
NEUTROS ABS: 5.5 10*3/uL (ref 1.4–7.7)
Neutrophils Relative %: 68.2 % (ref 43.0–77.0)
PLATELETS: 225 10*3/uL (ref 150.0–400.0)
RBC: 3.23 Mil/uL — ABNORMAL LOW (ref 3.87–5.11)
RDW: 19 % — AB (ref 11.5–15.5)
WBC: 8.1 10*3/uL (ref 4.0–10.5)

## 2016-07-09 LAB — LIPID PANEL
CHOLESTEROL: 128 mg/dL (ref 0–200)
HDL: 62.6 mg/dL (ref >39.00)
LDL Cholesterol: 50 mg/dL (ref 0–99)
NonHDL: 65
Total CHOL/HDL Ratio: 2
Triglycerides: 74 mg/dL (ref 0.0–149.0)
VLDL: 14.8 mg/dL (ref 0.0–40.0)

## 2016-07-09 LAB — COMPREHENSIVE METABOLIC PANEL
ALBUMIN: 4.4 g/dL (ref 3.5–5.2)
ALT: 12 U/L (ref 0–35)
AST: 18 U/L (ref 0–37)
Alkaline Phosphatase: 75 U/L (ref 39–117)
BUN: 25 mg/dL — AB (ref 6–23)
CHLORIDE: 106 meq/L (ref 96–112)
CO2: 27 meq/L (ref 19–32)
Calcium: 9.5 mg/dL (ref 8.4–10.5)
Creatinine, Ser: 1.13 mg/dL (ref 0.40–1.20)
GFR: 48.96 mL/min — ABNORMAL LOW (ref >60.00)
Glucose, Bld: 99 mg/dL (ref 70–99)
POTASSIUM: 4.6 meq/L (ref 3.5–5.1)
SODIUM: 140 meq/L (ref 135–145)
Total Bilirubin: 2 mg/dL — ABNORMAL HIGH (ref 0.2–1.2)
Total Protein: 6.6 g/dL (ref 6.0–8.3)

## 2016-07-09 NOTE — Progress Notes (Signed)
Subjective:  Patient ID: Claudia Lara, female    DOB: 1932/02/18  Age: 82 y.o. MRN: [id number]  CC: Hypertension and Hyperlipidemia   HPI DINASIA SHROFF presents for follow-up on hypertension and hypercholesterolemia. She recently had a urinary tract infection and took a course of nitrofurantoin. She tells me that all his symptoms have resolved. She tells me her blood pressures been well controlled on carvedilol. She denies any recent episodes of headache/blurred vision/chest pain/shortness of breath/palpitations/edema/or fatigue.  Outpatient Medications Prior to Visit  Medication Sig Dispense Refill  . Ascorbic Acid (VITAMIN C) 500 MG tablet Take 500 mg by mouth every morning.     . Calcium Carbonate-Vit D-Min (CALCIUM 600 + MINERALS) 600-200 MG-UNIT TABS Take 1 tablet by mouth every morning.     . carvedilol (COREG) 12.5 MG tablet TAKE 1 TABLET BY MOUTH 2 TIMES DAILY WITH A MEAL. 180 tablet 2  . cholecalciferol (VITAMIN D) 1000 UNITS tablet Take 1,000 Units by mouth every morning.     . meloxicam (MOBIC) 7.5 MG tablet Take 1 tablet (7.5 mg total) by mouth daily. 90 tablet 1  . Multiple Vitamins-Minerals (CENTRUM SILVER ADULT 50+) TABS Take 1 tablet by mouth every morning.    . pantoprazole (PROTONIX) 40 MG tablet Take 1 tablet (40 mg total) by mouth daily. 90 tablet 3  . Polyethyl Glycol-Propyl Glycol (SYSTANE OP) Apply 1-2 drops to eye daily as needed (dry eyes).    . traMADol (ULTRAM) 50 MG tablet Take 50 mg by mouth every 6 (six) hours as needed for moderate pain.    . Glucosamine-Chondroit-Vit C-Mn (GLUCOSAMINE 1500 COMPLEX) CAPS Take 1 capsule by mouth at bedtime.      No facility-administered medications prior to visit.     ROS Review of Systems  Constitutional: Negative.  Negative for activity change, appetite change, chills, fatigue and fever.  HENT: Negative.  Negative for sinus pressure, trouble swallowing and voice change.   Eyes: Negative.  Negative for visual  disturbance.  Respiratory: Negative.  Negative for cough, choking, chest tightness, shortness of breath and stridor.   Cardiovascular: Negative.  Negative for chest pain, palpitations and leg swelling.  Gastrointestinal: Negative.  Negative for abdominal pain, constipation, diarrhea, nausea and vomiting.  Endocrine: Negative.   Genitourinary: Negative.  Negative for decreased urine volume, dysuria, flank pain, frequency, hematuria and urgency.  Musculoskeletal: Negative.  Negative for arthralgias, back pain, myalgias and neck pain.  Skin: Negative.  Negative for color change and rash.  Allergic/Immunologic: Negative.   Neurological: Negative.   Hematological: Negative.  Negative for adenopathy. Does not bruise/bleed easily.  Psychiatric/Behavioral: Negative.     Objective:  BP 140/80 (BP Location: Left Arm, Patient Position: Sitting, Cuff Size: Normal)   Pulse 61   Temp 98.2 F (36.8 C) (Oral)   Ht 5\' 3"  (1.6 m)   Wt 169 lb (76.7 kg)   SpO2 97%   BMI 29.94 kg/m   BP Readings from Last 3 Encounters:  07/09/16 140/80  03/05/16 (!) 144/70  02/01/16 (!) 195/61    Wt Readings from Last 3 Encounters:  07/09/16 169 lb (76.7 kg)  03/05/16 170 lb (77.1 kg)  12/01/15 172 lb (78 kg)    Physical Exam  Constitutional: She is oriented to person, place, and time. No distress.  HENT:  Mouth/Throat: Oropharynx is clear and moist. No oropharyngeal exudate.  Eyes: Conjunctivae are normal. Right eye exhibits no discharge. Left eye exhibits no discharge. No scleral icterus.  Neck: Normal range of  motion. Neck supple. No JVD present. No tracheal deviation present. No thyromegaly present.  Cardiovascular: Normal rate, regular rhythm, normal heart sounds and intact distal pulses.  Exam reveals no gallop and no friction rub.   No murmur heard. Pulmonary/Chest: Effort normal and breath sounds normal. No stridor. No respiratory distress. She has no wheezes. She has no rales. She exhibits no  tenderness.  Abdominal: Soft. Bowel sounds are normal. She exhibits no distension and no mass. There is no tenderness. There is no rebound and no guarding.  Musculoskeletal: Normal range of motion. She exhibits no edema, tenderness or deformity.  Lymphadenopathy:    She has no cervical adenopathy.  Neurological: She is oriented to person, place, and time.  Skin: Skin is warm and dry. No rash noted. She is not diaphoretic. No erythema. No pallor.  Vitals reviewed.   Lab Results  Component Value Date   WBC 8.1 07/09/2016   HGB 10.1 (L) 07/09/2016   HCT 29.1 (L) 07/09/2016   PLT 225.0 07/09/2016   GLUCOSE 99 07/09/2016   CHOL 128 07/09/2016   TRIG 74.0 07/09/2016   HDL 62.60 07/09/2016   LDLCALC 50 07/09/2016   ALT 12 07/09/2016   AST 18 07/09/2016   NA 140 07/09/2016   K 4.6 07/09/2016   CL 106 07/09/2016   CREATININE 1.13 07/09/2016   BUN 25 (H) 07/09/2016   CO2 27 07/09/2016   TSH 0.96 03/05/2016   INR 1.3 (H) 11/09/2015   HGBA1C 5.2 03/05/2016    Dg Ercp Biliary & Pancreatic Ducts  Result Date: 02/01/2016 CLINICAL DATA:  choledocholithiasis EXAM: ERCP TECHNIQUE: Multiple spot images obtained with the fluoroscopic device and submitted for interpretation post-procedure. FLUOROSCOPY TIME:  1 minutes 53 seconds, 34 mGy COMPARISON:  12/03/2015 FINDINGS: A single fluoroscopic spot images submitted. There is incomplete opacification of intrahepatic and extrahepatic biliary tree. Surgical clips in the gallbladder fossa. Filling defects are suspected in the distal CBD. IMPRESSION: See above. These images were submitted for radiologic interpretation only. Please see the procedural report for the amount of contrast and the fluoroscopy time utilized. Electronically Signed   By: Lucrezia Europe M.D.   On: 02/01/2016 14:08    Assessment & Plan:   Jenell was seen today for hypertension and hyperlipidemia.  Diagnoses and all orders for this visit:  Anemia of chronic disease- Improvement  noted -     CBC with Differential/Platelet; Future  Kidney disease, chronic, stage III (GFR 30-59 ml/min)- her renal function has improved, she will continue to avoid nephrotoxic agents, will continue to maintain strict blood pressure control -     Comprehensive metabolic panel; Future -     Urinalysis, Routine w reflex microscopic (not at [facility]); Future  Carotid artery stenosis without cerebral infarction, bilateral  Essential hypertension- her blood pressure is adequately well controlled. -     Comprehensive metabolic panel; Future -     Lipid panel; Future -     Urinalysis, Routine w reflex microscopic (not at [facility]); Future  Hyperlipidemia with target LDL less than 130- she has achieved her LDL goal and is tolerating statin therapy well -     Lipid panel; Future  Acute cystitis without hematuria- this has resolved   I have discontinued Ms. Kerestes GLUCOSAMINE 1500 COMPLEX. I am also having her maintain her CALCIUM 600 + MINERALS, vitamin C, cholecalciferol, CENTRUM SILVER ADULT 50+, pantoprazole, Polyethyl Glycol-Propyl Glycol (SYSTANE OP), traMADol, meloxicam, and carvedilol.  No orders of the defined types were placed in this encounter.  Follow-up: Return in about 3 weeks (around 07/30/2016).  [name], MD

## 2016-07-09 NOTE — Patient Instructions (Signed)
Hypertension Hypertension, commonly called high blood pressure, is when the force of blood pumping through your arteries is too strong. Your arteries are the blood vessels that carry blood from your heart throughout your body. A blood pressure reading consists of a higher number over a lower number, such as 110/72. The higher number (systolic) is the pressure inside your arteries when your heart pumps. The lower number (diastolic) is the pressure inside your arteries when your heart relaxes. Ideally you want your blood pressure below 120/80. Hypertension forces your heart to work harder to pump blood. Your arteries may become narrow or stiff. Having untreated or uncontrolled hypertension can cause heart attack, stroke, kidney disease, and other problems. RISK FACTORS Some risk factors for high blood pressure are controllable. Others are not.  Risk factors you cannot control include:   Race. You may be at higher risk if you are African American.  Age. Risk increases with age.  Gender. Men are at higher risk than women before age 45 years. After age 65, women are at higher risk than men. Risk factors you can control include:  Not getting enough exercise or physical activity.  Being overweight.  Getting too much fat, sugar, calories, or salt in your diet.  Drinking too much alcohol. SIGNS AND SYMPTOMS Hypertension does not usually cause signs or symptoms. Extremely high blood pressure (hypertensive crisis) may cause headache, anxiety, shortness of breath, and nosebleed. DIAGNOSIS To check if you have hypertension, your health care provider will measure your blood pressure while you are seated, with your arm held at the level of your heart. It should be measured at least twice using the same arm. Certain conditions can cause a difference in blood pressure between your right and left arms. A blood pressure reading that is higher than normal on one occasion does not mean that you need treatment. If  it is not clear whether you have high blood pressure, you may be asked to return on a different day to have your blood pressure checked again. Or, you may be asked to monitor your blood pressure at home for 1 or more weeks. TREATMENT Treating high blood pressure includes making lifestyle changes and possibly taking medicine. Living a healthy lifestyle can help lower high blood pressure. You may need to change some of your habits. Lifestyle changes may include:  Following the DASH diet. This diet is high in fruits, vegetables, and whole grains. It is low in salt, red meat, and added sugars.  Keep your sodium intake below 2,300 mg per day.  Getting at least 30-45 minutes of aerobic exercise at least 4 times per week.  Losing weight if necessary.  Not smoking.  Limiting alcoholic beverages.  Learning ways to reduce stress. Your health care provider may prescribe medicine if lifestyle changes are not enough to get your blood pressure under control, and if one of the following is true:  You are 18-59 years of age and your systolic blood pressure is above 140.  You are 60 years of age or older, and your systolic blood pressure is above 150.  Your diastolic blood pressure is above 90.  You have diabetes, and your systolic blood pressure is over 140 or your diastolic blood pressure is over 90.  You have kidney disease and your blood pressure is above 140/90.  You have heart disease and your blood pressure is above 140/90. Your personal target blood pressure may vary depending on your medical conditions, your age, and other factors. HOME CARE INSTRUCTIONS    Have your blood pressure rechecked as directed by your health care provider.   Take medicines only as directed by your health care provider. Follow the directions carefully. Blood pressure medicines must be taken as prescribed. The medicine does not work as well when you skip doses. Skipping doses also puts you at risk for  problems.  Do not smoke.   Monitor your blood pressure at home as directed by your health care provider. SEEK MEDICAL CARE IF:   You think you are having a reaction to medicines taken.  You have recurrent headaches or feel dizzy.  You have swelling in your ankles.  You have trouble with your vision. SEEK IMMEDIATE MEDICAL CARE IF:  You develop a severe headache or confusion.  You have unusual weakness, numbness, or feel faint.  You have severe chest or abdominal pain.  You vomit repeatedly.  You have trouble breathing. MAKE SURE YOU:   Understand these instructions.  Will watch your condition.  Will get help right away if you are not doing well or get worse.   This information is not intended to replace advice given to you by your health care provider. Make sure you discuss any questions you have with your health care provider.   Document Released: 11/11/2005 Document Revised: 03/28/2015 Document Reviewed: 09/03/2013 Elsevier Interactive Patient Education 2016 Elsevier Inc.  

## 2016-07-09 NOTE — Progress Notes (Signed)
Pre visit review using our clinic review tool, if applicable. No additional management support is needed unless otherwise documented below in the visit note. 

## 2016-08-01 ENCOUNTER — Ambulatory Visit (INDEPENDENT_AMBULATORY_CARE_PROVIDER_SITE_OTHER): Payer: Medicare Other | Admitting: Internal Medicine

## 2016-08-01 ENCOUNTER — Encounter: Payer: Self-pay | Admitting: Internal Medicine

## 2016-08-01 VITALS — BP 142/74 | HR 67 | Temp 97.8°F | Resp 16 | Ht 63.0 in | Wt 170.1 lb

## 2016-08-01 DIAGNOSIS — I350 Nonrheumatic aortic (valve) stenosis: Secondary | ICD-10-CM | POA: Diagnosis not present

## 2016-08-01 DIAGNOSIS — I10 Essential (primary) hypertension: Secondary | ICD-10-CM | POA: Diagnosis not present

## 2016-08-01 DIAGNOSIS — I6523 Occlusion and stenosis of bilateral carotid arteries: Secondary | ICD-10-CM

## 2016-08-01 DIAGNOSIS — Z23 Encounter for immunization: Secondary | ICD-10-CM | POA: Diagnosis not present

## 2016-08-01 DIAGNOSIS — D589 Hereditary hemolytic anemia, unspecified: Secondary | ICD-10-CM | POA: Diagnosis not present

## 2016-08-01 MED ORDER — ASPIRIN EC 81 MG PO TBEC: 81.0000 mg | DELAYED_RELEASE_TABLET | Freq: Every day | ORAL | 3 refills | Status: DC

## 2016-08-01 NOTE — Progress Notes (Signed)
Pre visit review using our clinic review tool, if applicable. No additional management support is needed unless otherwise documented below in the visit note. 

## 2016-08-01 NOTE — Progress Notes (Signed)
Subjective:  Patient ID: Claudia Lara, female    DOB: [birth year]  Age: 82 y.o. MRN: [id number]  CC: Hypertension and Anemia   HPI BRITTINEE YALDO presents for a BP check and follow-up on anemia. She feels well today and offers no complaints. She tells me her blood pressure has been well controlled at home on carvedilol. She denies any recent episodes of palpitations, fatigue, syncope, chest pain, shortness of breath, headache, or blurred vision.  Outpatient Medications Prior to Visit  Medication Sig Dispense Refill  . Ascorbic Acid (VITAMIN C) 500 MG tablet Take 500 mg by mouth every morning.     . Calcium Carbonate-Vit D-Min (CALCIUM 600 + MINERALS) 600-200 MG-UNIT TABS Take 1 tablet by mouth every morning.     . carvedilol (COREG) 12.5 MG tablet TAKE 1 TABLET BY MOUTH 2 TIMES DAILY WITH A MEAL. 180 tablet 2  . cholecalciferol (VITAMIN D) 1000 UNITS tablet Take 1,000 Units by mouth every morning.     . pantoprazole (PROTONIX) 40 MG tablet Take 1 tablet (40 mg total) by mouth daily. 90 tablet 3  . Polyethyl Glycol-Propyl Glycol (SYSTANE OP) Apply 1-2 drops to eye daily as needed (dry eyes).    . traMADol (ULTRAM) 50 MG tablet Take 50 mg by mouth every 6 (six) hours as needed for moderate pain.    . meloxicam (MOBIC) 7.5 MG tablet Take 1 tablet (7.5 mg total) by mouth daily. 90 tablet 1  . Multiple Vitamins-Minerals (CENTRUM SILVER ADULT 50+) TABS Take 1 tablet by mouth every morning.     No facility-administered medications prior to visit.     ROS Review of Systems  Constitutional: Negative for activity change, appetite change, diaphoresis, fatigue and unexpected weight change.  HENT: Negative.  Negative for trouble swallowing and voice change.   Eyes: Negative.  Negative for visual disturbance.  Respiratory: Negative.  Negative for cough, choking, chest tightness, shortness of breath and stridor.   Cardiovascular: Negative.  Negative for chest pain, palpitations and leg swelling.    Gastrointestinal: Negative.  Negative for abdominal pain, constipation, diarrhea, nausea and vomiting.  Endocrine: Negative.   Genitourinary: Negative.  Negative for difficulty urinating and dysuria.  Musculoskeletal: Negative.  Negative for arthralgias, back pain and joint swelling.  Skin: Negative.  Negative for color change and rash.  Allergic/Immunologic: Negative.   Neurological: Negative.  Negative for dizziness, tremors, weakness, light-headedness and headaches.  Hematological: Negative.  Negative for adenopathy. Does not bruise/bleed easily.  Psychiatric/Behavioral: Negative.     Objective:  BP (!) 142/74   Pulse 67   Temp 97.8 F (36.6 C) (Oral)   Resp 16   Ht 5\' 3"  (1.6 m)   Wt 170 lb 2 oz (77.2 kg)   SpO2 98%   BMI 30.14 kg/m   BP Readings from Last 3 Encounters:  08/01/16 (!) 142/74  07/09/16 140/80  03/05/16 (!) 144/70    Wt Readings from Last 3 Encounters:  08/01/16 170 lb 2 oz (77.2 kg)  07/09/16 169 lb (76.7 kg)  03/05/16 170 lb (77.1 kg)    Physical Exam  Constitutional: She is oriented to person, place, and time. No distress.  HENT:  Mouth/Throat: Oropharynx is clear and moist. No oropharyngeal exudate.  Eyes: Conjunctivae are normal. Right eye exhibits no discharge. Left eye exhibits no discharge. No scleral icterus.  Neck: Normal range of motion. Neck supple. No JVD present. No tracheal deviation present. No thyromegaly present.  Cardiovascular: Normal rate, regular rhythm, S1 normal, S2  normal and intact distal pulses.  Exam reveals no gallop and no friction rub.   Murmur heard.  Systolic murmur is present with a grade of 1/6   No diastolic murmur is present  Pulmonary/Chest: Effort normal and breath sounds normal. No respiratory distress. She has no wheezes. She has no rales. She exhibits no tenderness.  Abdominal: Soft. Bowel sounds are normal. She exhibits no distension and no mass. There is no tenderness. There is no rebound and no guarding.   Musculoskeletal: Normal range of motion. She exhibits no edema, tenderness or deformity.  Lymphadenopathy:    She has no cervical adenopathy.  Neurological: She is oriented to person, place, and time.  Skin: Skin is warm and dry. No rash noted. She is not diaphoretic. No erythema. No pallor.  Vitals reviewed.   Lab Results  Component Value Date   WBC 8.1 07/09/2016   HGB 10.1 (L) 07/09/2016   HCT 29.1 (L) 07/09/2016   PLT 225.0 07/09/2016   GLUCOSE 99 07/09/2016   CHOL 128 07/09/2016   TRIG 74.0 07/09/2016   HDL 62.60 07/09/2016   LDLCALC 50 07/09/2016   ALT 12 07/09/2016   AST 18 07/09/2016   NA 140 07/09/2016   K 4.6 07/09/2016   CL 106 07/09/2016   CREATININE 1.13 07/09/2016   BUN 25 (H) 07/09/2016   CO2 27 07/09/2016   TSH 0.96 03/05/2016   INR 1.3 (H) 11/09/2015   HGBA1C 5.2 03/05/2016    Dg Ercp Biliary & Pancreatic Ducts  Result Date: 02/01/2016 CLINICAL DATA:  choledocholithiasis EXAM: ERCP TECHNIQUE: Multiple spot images obtained with the fluoroscopic device and submitted for interpretation post-procedure. FLUOROSCOPY TIME:  1 minutes 53 seconds, 34 mGy COMPARISON:  12/03/2015 FINDINGS: A single fluoroscopic spot images submitted. There is incomplete opacification of intrahepatic and extrahepatic biliary tree. Surgical clips in the gallbladder fossa. Filling defects are suspected in the distal CBD. IMPRESSION: See above. These images were submitted for radiologic interpretation only. Please see the procedural report for the amount of contrast and the fluoroscopy time utilized. Electronically Signed   By: Lucrezia Europe M.D.   On: 02/01/2016 14:08    Assessment & Plan:   Ruthmae was seen today for hypertension and anemia.  Diagnoses and all orders for this visit:  Need for prophylactic vaccination and inoculation against influenza -     Flu vaccine HIGH DOSE PF (Fluzone High dose)  Carotid artery stenosis without cerebral infarction, bilateral- I've asked her to  start aspirin therapy for risk reduction -     aspirin EC 81 MG tablet; Take 1 tablet (81 mg total) by mouth daily.  Hereditary hemolytic anemia ([place])- her H&H have been stable and she is asymptomatic, will continue to follow this.  Essential hypertension- her blood pressure is adequately well-controlled.  Aortic stenosis, mild- she is asymptomatic with respect to this center murmur is unchanged, no further investigation or treatment is indicated at this time.   I have discontinued Ms. Avallone CENTRUM SILVER ADULT 50+, meloxicam, and Glucosamine. I am also having her start on aspirin EC. Additionally, I am having her maintain her CALCIUM 600 + MINERALS, vitamin C, cholecalciferol, pantoprazole, Polyethyl Glycol-Propyl Glycol (SYSTANE OP), traMADol, and carvedilol.  Meds ordered this encounter  Medications  . DISCONTD: Glucosamine 750 MG TABS    Sig: Take by mouth.  [name] aspirin EC 81 MG tablet    Sig: Take 1 tablet (81 mg total) by mouth daily.    Dispense:  90 tablet  Refill:  3     Follow-up: Return in about 6 months (around 01/29/2017).  [name], MD

## 2016-08-01 NOTE — Patient Instructions (Signed)
Hypertension Hypertension, commonly called high blood pressure, is when the force of blood pumping through your arteries is too strong. Your arteries are the blood vessels that carry blood from your heart throughout your body. A blood pressure reading consists of a higher number over a lower number, such as 110/72. The higher number (systolic) is the pressure inside your arteries when your heart pumps. The lower number (diastolic) is the pressure inside your arteries when your heart relaxes. Ideally you want your blood pressure below 120/80. Hypertension forces your heart to work harder to pump blood. Your arteries may become narrow or stiff. Having untreated or uncontrolled hypertension can cause heart attack, stroke, kidney disease, and other problems. RISK FACTORS Some risk factors for high blood pressure are controllable. Others are not.  Risk factors you cannot control include:   Race. You may be at higher risk if you are African American.  Age. Risk increases with age.  Gender. Men are at higher risk than women before age 45 years. After age 65, women are at higher risk than men. Risk factors you can control include:  Not getting enough exercise or physical activity.  Being overweight.  Getting too much fat, sugar, calories, or salt in your diet.  Drinking too much alcohol. SIGNS AND SYMPTOMS Hypertension does not usually cause signs or symptoms. Extremely high blood pressure (hypertensive crisis) may cause headache, anxiety, shortness of breath, and nosebleed. DIAGNOSIS To check if you have hypertension, your health care provider will measure your blood pressure while you are seated, with your arm held at the level of your heart. It should be measured at least twice using the same arm. Certain conditions can cause a difference in blood pressure between your right and left arms. A blood pressure reading that is higher than normal on one occasion does not mean that you need treatment. If  it is not clear whether you have high blood pressure, you may be asked to return on a different day to have your blood pressure checked again. Or, you may be asked to monitor your blood pressure at home for 1 or more weeks. TREATMENT Treating high blood pressure includes making lifestyle changes and possibly taking medicine. Living a healthy lifestyle can help lower high blood pressure. You may need to change some of your habits. Lifestyle changes may include:  Following the DASH diet. This diet is high in fruits, vegetables, and whole grains. It is low in salt, red meat, and added sugars.  Keep your sodium intake below 2,300 mg per day.  Getting at least 30-45 minutes of aerobic exercise at least 4 times per week.  Losing weight if necessary.  Not smoking.  Limiting alcoholic beverages.  Learning ways to reduce stress. Your health care provider may prescribe medicine if lifestyle changes are not enough to get your blood pressure under control, and if one of the following is true:  You are 18-59 years of age and your systolic blood pressure is above 140.  You are 60 years of age or older, and your systolic blood pressure is above 150.  Your diastolic blood pressure is above 90.  You have diabetes, and your systolic blood pressure is over 140 or your diastolic blood pressure is over 90.  You have kidney disease and your blood pressure is above 140/90.  You have heart disease and your blood pressure is above 140/90. Your personal target blood pressure may vary depending on your medical conditions, your age, and other factors. HOME CARE INSTRUCTIONS    Have your blood pressure rechecked as directed by your health care provider.   Take medicines only as directed by your health care provider. Follow the directions carefully. Blood pressure medicines must be taken as prescribed. The medicine does not work as well when you skip doses. Skipping doses also puts you at risk for  problems.  Do not smoke.   Monitor your blood pressure at home as directed by your health care provider. SEEK MEDICAL CARE IF:   You think you are having a reaction to medicines taken.  You have recurrent headaches or feel dizzy.  You have swelling in your ankles.  You have trouble with your vision. SEEK IMMEDIATE MEDICAL CARE IF:  You develop a severe headache or confusion.  You have unusual weakness, numbness, or feel faint.  You have severe chest or abdominal pain.  You vomit repeatedly.  You have trouble breathing. MAKE SURE YOU:   Understand these instructions.  Will watch your condition.  Will get help right away if you are not doing well or get worse.   This information is not intended to replace advice given to you by your health care provider. Make sure you discuss any questions you have with your health care provider.   Document Released: 11/11/2005 Document Revised: 03/28/2015 Document Reviewed: 09/03/2013 Elsevier Interactive Patient Education 2016 Elsevier Inc.  

## 2016-08-05 DIAGNOSIS — H524 Presbyopia: Secondary | ICD-10-CM | POA: Diagnosis not present

## 2016-08-05 DIAGNOSIS — H52201 Unspecified astigmatism, right eye: Secondary | ICD-10-CM | POA: Diagnosis not present

## 2016-08-05 DIAGNOSIS — H04123 Dry eye syndrome of bilateral lacrimal glands: Secondary | ICD-10-CM | POA: Diagnosis not present

## 2016-08-05 DIAGNOSIS — H2513 Age-related nuclear cataract, bilateral: Secondary | ICD-10-CM | POA: Diagnosis not present

## 2016-08-05 DIAGNOSIS — H5201 Hypermetropia, right eye: Secondary | ICD-10-CM | POA: Diagnosis not present

## 2016-08-30 ENCOUNTER — Other Ambulatory Visit: Payer: Self-pay | Admitting: Internal Medicine

## 2016-08-30 DIAGNOSIS — M159 Polyosteoarthritis, unspecified: Secondary | ICD-10-CM

## 2016-09-01 ENCOUNTER — Other Ambulatory Visit: Payer: Self-pay | Admitting: Internal Medicine

## 2016-09-02 ENCOUNTER — Other Ambulatory Visit: Payer: Self-pay | Admitting: Internal Medicine

## 2016-09-02 NOTE — Telephone Encounter (Signed)
Faxed script back to [facility].../lmb 

## 2016-09-23 ENCOUNTER — Other Ambulatory Visit: Payer: Self-pay | Admitting: Internal Medicine

## 2016-09-24 ENCOUNTER — Encounter (INDEPENDENT_AMBULATORY_CARE_PROVIDER_SITE_OTHER): Payer: Self-pay | Admitting: Orthopedic Surgery

## 2016-09-24 ENCOUNTER — Ambulatory Visit (INDEPENDENT_AMBULATORY_CARE_PROVIDER_SITE_OTHER): Payer: Medicare Other | Admitting: Orthopedic Surgery

## 2016-09-24 VITALS — BP 146/49 | HR 69 | Resp 12 | Ht 66.0 in | Wt 175.0 lb

## 2016-09-24 DIAGNOSIS — M1611 Unilateral primary osteoarthritis, right hip: Secondary | ICD-10-CM | POA: Diagnosis not present

## 2016-09-24 DIAGNOSIS — M25551 Pain in right hip: Secondary | ICD-10-CM | POA: Diagnosis not present

## 2016-09-24 NOTE — Progress Notes (Signed)
Office Visit Note   Patient: Claudia Lara           Date of Birth: [birth year]           MRN: [id number] Visit Date: 09/24/2016              Requested by: Janith Lima, MD [address] [place], [place] [address] PCP: [name], MD   Assessment & Plan: Visit Diagnoses:  1. Primary osteoarthritis of right hip   2. Pain of right hip joint     Plan: At this time our plan is to provide her with clearance forms and once these are complete she would like to then proceed with a right total hip arthroplasty early next year.   Follow-Up Instructions: No Follow-up on file.   Orders:  No orders of the defined types were placed in this encounter.  No orders of the defined types were placed in this encounter.     Procedures: No procedures performed   Clinical Data: No additional findings.   Subjective: No chief complaint on file.   What is an 82 year old white female who comes in today for complaints of pain in her right lateral hip as well as into her groin. She's noticed now that is having difficulty with get her shoes and socks on because she has limited motion in her right hip. Having pain with every step. She does have some nighttime pain when she does try to roll over. She states she knows that she has fairly significant arthritis in her right hip and we have given her x-rays pictures to her previously. She states though that her symptoms are now worsening with her groin pain and difficulty with activities of daily living that she would like to consider possibly having a total hip replacement near future. Comes in today for this evaluation. hite female who comes in today for complaints of pain in her right lateral hip as well as into her groin. She's noticed now that is having difficulty with get her shoes and socks on because she has limited motion in her right hip. Having pain with every step. She does have some nighttime pain when she does try to roll over. She states she knows that she has fairly significant arthritis in her right hip and we have given her x-rays pictures to her previously. She states though that her symptoms are now worsening with her groin pain and difficulty with activities of daily living that she would like to consider possibly having a total hip replacement near future. Comes in today for this evaluation.      Review of Systems  Constitutional: Negative.   HENT: Negative.   Eyes: Negative.   Respiratory: Negative.   Cardiovascular:       H/O hypertension and heart murmur  Gastrointestinal: Negative.   Endocrine: Negative.   Genitourinary:       Nocturia 2-3 times per evening  Allergic/Immunologic: Negative.     Neurological: Negative.   Hematological: Negative.   Psychiatric/Behavioral: Negative.      Objective: Vital Signs: BP (!) 146/49 (BP Location: Right Arm)   Pulse 69   Resp 12   Ht 5\' 6"  (1.676 m)   Wt 175 lb (79.4 kg)   BMI 28.25 kg/m   Physical Exam  Constitutional: She is oriented to person, place, and time. She appears well-developed and well-nourished.  HENT:  Head: Normocephalic and atraumatic.  Eyes: EOM are normal. Pupils are equal, round, and reactive to light.  Neck: Neck supple.  No carotid bruits  Cardiovascular: Normal rate and regular rhythm.   Pulmonary/Chest: Effort normal.  Neurological: She is alert and oriented to person, place, and time.  Skin: Skin is warm and dry.  Psychiatric: She has a normal mood and affect. Her behavior is normal. Judgment and thought content normal.    Right Hip Exam   Tenderness  The patient is experiencing tenderness in the greater trochanter.  Range of Motion  Flexion: 90  Internal Rotation: 5  External Rotation: 20   Muscle Strength  Flexion: 4/5   Other  Sensation: normal      Specialty Comments:  No specialty comments available.  Imaging: Marked cysts with joint space narrowing  PMFS History: Patient Active Problem List   Diagnosis Date Noted  . GERD (gastroesophageal reflux disease) 08/03/2015  . Hemolytic anemia ([place]) 07/12/2015  . Chronic venous insufficiency 08/10/2014  . Aortic stenosis, mild 06/29/2014  . Left ventricular diastolic dysfunction, NYHA class 1 06/29/2014  . Senile osteopenia 04/21/2014  . Hyperlipidemia with target LDL less than 130 04/21/2014  . Kidney disease, chronic, stage III (GFR 30-59 ml/min) 04/21/2014  . Routine health maintenance 07/16/2012  . Carotid artery stenosis without cerebral infarction 01/16/2010  . GEN OSTEOARTHROSIS INVOLVING MULTIPLE SITES 11/11/2008  . Essential hypertension 10/19/2007   Past Medical History:  Diagnosis Date  . ANEMIA 08/22/2009  .  CAROTID ARTERY STENOSIS 01/16/2010  . Complication of anesthesia    very hard to wake up from surgery  . DYSPNEA ON EXERTION 11/16/2007  . Gallstones   . GEN OSTEOARTHROSIS INVOLVING MULTIPLE SITES 11/11/2008  . HEMORRHOIDS, INTERNAL    surgery was in the 90's (per patient)  . NECK PAIN, ACUTE 12/28/2009  . PERIPHERAL EDEMA 10/06/2007  . PVD 10/06/2007  . Unspecified essential hypertension 10/19/2007    Family History  Problem Relation Age of Onset  . Ovarian cancer Mother   . Cancer Mother   . Leukemia Father   . Lung cancer Father   . Cancer Father   . Diabetes Neg Hx   . Heart disease Neg Hx   . Hypertension Neg Hx   . Cancer Brother     Past Surgical History:  Procedure Laterality Date  . ABDOMINAL HYSTERECTOMY    . CHOLECYSTECTOMY  01/29/2016   at [facility]  . ENDOSCOPIC RETROGRADE CHOLANGIOPANCREATOGRAPHY (ERCP) WITH PROPOFOL N/A 12/01/2015   Procedure: ENDOSCOPIC RETROGRADE CHOLANGIOPANCREATOGRAPHY (ERCP) WITH PROPOFOL;  Surgeon: [name], MD;  Location: [facility] ENDOSCOPY;  Service: Endoscopy;  Laterality: N/A;  . ENDOSCOPIC RETROGRADE CHOLANGIOPANCREATOGRAPHY (ERCP) WITH PROPOFOL N/A 02/01/2016   Procedure: ENDOSCOPIC RETROGRADE CHOLANGIOPANCREATOGRAPHY (ERCP) WITH PROPOFOL;  Surgeon: [name], MD;  Location: [facility] ENDOSCOPY;  Service: Endoscopy;  Laterality: N/A;  . Hyperplastic colon polyps, removed  2007   By Dr. Penelope Coop  . JOINT REPLACEMENT  2011   right knee  . OOPHORECTOMY    . ROTATOR CUFF REPAIR  2004   left (Dr. Durward Fortes)  . TOTAL KNEE ARTHROPLASTY Right    Social History   Occupational History  . Network engineer Retired    retired   Social History Main Topics  . Smoking status: Never Smoker  . Smokeless tobacco: Never Used  . Alcohol use No  . Drug use: No  . Sexual activity: Not Currently

## 2016-09-26 ENCOUNTER — Telehealth: Payer: Self-pay | Admitting: *Deleted

## 2016-09-26 NOTE — Telephone Encounter (Signed)
Received surgical clearance request form from [employer]. Dr. Durward Fortes for patient who needs right total hip surgery.  I called [employer] [phone] and left detailed message that Claudia Lara is a former Dr. Mare Ferrari patient and is scheduled to be seen by Dr. Harrington Challenger on 10/14/16.  Advised I will hold the clearance form until the time of her visit and give to Dr. Harrington Challenger for completion.

## 2016-10-13 NOTE — Progress Notes (Signed)
Cardiology Office Note   Date:  10/20/2016   ID:  Leisly, Sheck 1932-03-06, MRN [id number]  PCP:  [name], MD  Cardiologist:   Dorris Carnes, MD    F/U of AS   History of Present Illness: Pt is an 82 yo with a history of exertional dyaspnea  Myoview in 2014 normal  Echo in 2014 mild AS with mean gradient of 14  Mild to mod MR She was previoulsly seen by T Brackbill.Last seen in 2016  She still gets some SOB with walking  Stable  No increase No CP       Outpatient Medications Prior to Visit  Medication Sig Dispense Refill  . Calcium Carbonate-Vit D-Min (CALCIUM 600 + MINERALS) 600-200 MG-UNIT TABS Take 1 tablet by mouth every morning.     . carvedilol (COREG) 12.5 MG tablet TAKE 1 TABLET BY MOUTH 2 TIMES DAILY WITH A MEAL. 180 tablet 2  . cholecalciferol (VITAMIN D) 1000 UNITS tablet Take 1,000 Units by mouth every morning.     . meloxicam (MOBIC) 7.5 MG tablet TAKE 1 TABLET BY MOUTH DAILY 90 tablet 1  . multivitamin-iron-minerals-folic acid (CENTRUM) chewable tablet Chew by mouth.    . pantoprazole (PROTONIX) 40 MG tablet TAKE 1 TABLET (40 MG TOTAL) BY MOUTH DAILY. 90 tablet 1  . traMADol (ULTRAM) 50 MG tablet TAKE 1 TABLET EVERY 8 HOURS AS NEEDED 75 tablet 3  . Ascorbic Acid (VITAMIN C) 500 MG tablet Take 500 mg by mouth every morning.     [name] aspirin EC 81 MG tablet Take 1 tablet (81 mg total) by mouth daily. (Patient not taking: Reported on 10/14/2016) 90 tablet 3  . Polyethyl Glycol-Propyl Glycol (SYSTANE OP) Apply 1-2 drops to eye daily as needed (dry eyes).     No facility-administered medications prior to visit.      Allergies:   Sulfonamide derivatives   Past Medical History:  Diagnosis Date  . ANEMIA 08/22/2009  . CAROTID ARTERY STENOSIS 01/16/2010  . Complication of anesthesia    very hard to wake up from surgery  . DYSPNEA ON EXERTION 11/16/2007  . Gallstones   . GEN OSTEOARTHROSIS INVOLVING MULTIPLE SITES 11/11/2008  . HEMORRHOIDS, INTERNAL    surgery was in the 90's (per patient)  . NECK PAIN, ACUTE 12/28/2009  . PERIPHERAL EDEMA 10/06/2007  . PVD 10/06/2007  . Unspecified essential hypertension 10/19/2007    Past Surgical History:  Procedure Laterality Date  . ABDOMINAL HYSTERECTOMY    . CHOLECYSTECTOMY  01/29/2016   at [facility]  . ENDOSCOPIC RETROGRADE CHOLANGIOPANCREATOGRAPHY (ERCP) WITH PROPOFOL N/A 12/01/2015   Procedure: ENDOSCOPIC RETROGRADE CHOLANGIOPANCREATOGRAPHY (ERCP) WITH PROPOFOL;  Surgeon: [name], MD;  [facility];  Service: Endoscopy;  Laterality: N/A;  . ENDOSCOPIC RETROGRADE CHOLANGIOPANCREATOGRAPHY (ERCP) WITH PROPOFOL N/A 02/01/2016   Procedure: ENDOSCOPIC RETROGRADE CHOLANGIOPANCREATOGRAPHY (ERCP) WITH PROPOFOL;  Surgeon: [name], MD;  [facility];  Service: Endoscopy;  Laterality: N/A;  . Hyperplastic colon polyps, removed  2007   By Dr. Penelope Coop  . JOINT REPLACEMENT  2011   right knee  . OOPHORECTOMY    . ROTATOR CUFF REPAIR  2004   left (Dr. Durward Fortes)  . TOTAL KNEE ARTHROPLASTY Right      Social History:  The patient  reports that she has never smoked. She has never used smokeless tobacco. She reports that she does not drink alcohol or use drugs.   Family History:  The patient's family history includes Cancer in  her brother, father, and mother; Leukemia in her father; Lung cancer in her father; Ovarian cancer in her mother.    ROS:  Please see the history of present illness. All other systems are reviewed and  Negative to the above problem except as noted.    PHYSICAL EXAM: VS:  BP (!) 142/58   Pulse 69   Ht 5\' 6"  (1.676 m)   Wt 168 lb 6.4 oz (76.4 kg)   BMI 27.18 kg/m   GEN: Well nourished, well developed, in no acute distress  HEENT: normal  Neck: no JVD, carotid bruits, or masses Cardiac: RRR; Gr II/VI systoloic murmur rubs, or gallops,no edema  Respiratory:  clear to auscultation bilaterally, normal work of breathing GI: soft,  nontender, nondistended, + BS  No hepatomegaly  MS: no deformity Moving all extremities   Skin: warm and dry, no rash Neuro:  Strength and sensation are intact Psych: euthymic mood, full affect   EKG:  EKG is not  ordered today.   Lipid Panel    Component Value Date/Time   CHOL 128 07/09/2016 1136   TRIG 74.0 07/09/2016 1136   HDL 62.60 07/09/2016 1136   CHOLHDL 2 07/09/2016 1136   VLDL 14.8 07/09/2016 1136   LDLCALC 50 07/09/2016 1136      Wt Readings from Last 3 Encounters:  10/14/16 168 lb 6.4 oz (76.4 kg)  09/24/16 175 lb (79.4 kg)  08/01/16 170 lb 2 oz (77.2 kg)      ASSESSMENT AND PLAN:  1  Aortic stenosis  Will repeat echo to reassess gradient    Clinically I am not convinced of a change  2  Dyspena  I do not think this represents angina  Follow  I would set f/u for 1 year      Current medicines are reviewed at length with the patient today.  The patient does not have concerns regarding medicines.  Signed, Dorris Carnes, MD  10/20/2016 8:32 PM    [address], [place], [place]  [address] Phone: [phone]; Fax: [phone]

## 2016-10-14 ENCOUNTER — Ambulatory Visit (INDEPENDENT_AMBULATORY_CARE_PROVIDER_SITE_OTHER): Payer: Medicare Other | Admitting: Internal Medicine

## 2016-10-14 ENCOUNTER — Encounter: Payer: Self-pay | Admitting: Internal Medicine

## 2016-10-14 VITALS — BP 142/58 | HR 69 | Ht 66.0 in | Wt 168.4 lb

## 2016-10-14 DIAGNOSIS — I350 Nonrheumatic aortic (valve) stenosis: Secondary | ICD-10-CM | POA: Diagnosis not present

## 2016-10-14 NOTE — Patient Instructions (Signed)
Your physician recommends that you continue on your current medications as directed. Please refer to the Current Medication list given to you today.  Your physician has requested that you have an echocardiogram. Echocardiography is a painless test that uses sound waves to create images of your heart. It provides your doctor with information about the size and shape of your heart and how well your heart's chambers and valves are working. This procedure takes approximately one hour. There are no restrictions for this procedure.  Your physician wants you to follow-up in: 1 year with Dr. Ross.  You will receive a reminder letter in the mail two months in advance. If you don't receive a letter, please call our office to schedule the follow-up appointment.  

## 2016-10-28 DIAGNOSIS — Z1231 Encounter for screening mammogram for malignant neoplasm of breast: Secondary | ICD-10-CM | POA: Diagnosis not present

## 2016-10-28 LAB — HM MAMMOGRAPHY

## 2016-10-29 ENCOUNTER — Ambulatory Visit (HOSPITAL_COMMUNITY)
Admission: RE | Admit: 2016-10-29 | Discharge: 2016-10-29 | Disposition: A | Discharge status: Home / Self Care | Payer: Medicare Other | Source: Ambulatory Visit | Attending: Internal Medicine | Admitting: Internal Medicine

## 2016-10-29 DIAGNOSIS — I340 Nonrheumatic mitral (valve) insufficiency: Secondary | ICD-10-CM | POA: Insufficient documentation

## 2016-10-29 DIAGNOSIS — I313 Pericardial effusion (noninflammatory): Secondary | ICD-10-CM | POA: Insufficient documentation

## 2016-10-29 DIAGNOSIS — I350 Nonrheumatic aortic (valve) stenosis: Secondary | ICD-10-CM | POA: Insufficient documentation

## 2016-10-29 DIAGNOSIS — I501 Left ventricular failure: Secondary | ICD-10-CM | POA: Diagnosis not present

## 2016-10-29 DIAGNOSIS — I700 Atherosclerosis of aorta: Secondary | ICD-10-CM | POA: Diagnosis not present

## 2016-10-29 NOTE — Progress Notes (Signed)
  Echocardiogram 2D Echocardiogram has been performed.  Tresa Res 10/29/2016, 4:47 PM

## 2016-10-31 ENCOUNTER — Encounter: Payer: Self-pay | Admitting: Internal Medicine

## 2016-10-31 NOTE — Progress Notes (Signed)
Abstracted and sent to scan. Mammogram is no longer a metric due to pt age.

## 2016-11-06 ENCOUNTER — Encounter (INDEPENDENT_AMBULATORY_CARE_PROVIDER_SITE_OTHER): Payer: Self-pay | Admitting: Orthopedic Surgery

## 2016-11-06 ENCOUNTER — Ambulatory Visit (INDEPENDENT_AMBULATORY_CARE_PROVIDER_SITE_OTHER): Payer: Medicare Other | Admitting: Orthopedic Surgery

## 2016-11-06 VITALS — BP 138/44 | HR 68 | Temp 98.0°F | Resp 14 | Ht 63.5 in | Wt 166.0 lb

## 2016-11-06 DIAGNOSIS — M1611 Unilateral primary osteoarthritis, right hip: Secondary | ICD-10-CM | POA: Diagnosis not present

## 2016-11-06 NOTE — Progress Notes (Signed)
Office Visit Note   Patient: Claudia Lara           Date of Birth: Jun 15, 1932           MRN: [id number] Visit Date: 11/06/2016              Requested by: Janith Lima, MD [address], [place] [address] PCP: [name], MD   Assessment & Plan: Visit Diagnoses:  1. Unilateral primary osteoarthritis, right hip     Plan: Right total hip replacement Procedure risks and benefits were fully explained to patient she is understanding. Over the suture will entail as well as the postoperative course. Her plan is to go to [place] in place after surgery. Complications which are gone into detail.  Follow-Up Instructions: No Follow-up on file.   Orders:  No orders of the defined types were placed in this encounter.  No orders of the defined types were placed in this encounter.     Procedures: No procedures performed   Clinical Data: No additional findings.   Subjective: Chief Complaint  Patient presents with  . Right Hip - Pain    HPI Claudia Lara is an 82 year old white female who comes in today for complaints of pain in her right lateral hip as well as into her groin. She's noticed now that is having difficulty with get her shoes and socks on because she has limited motion in her right hip. Having pain with every step. She does have some nighttime pain when she does try to roll over. She states she knows that she has fairly significant arthritis in her right hip and we have given her x-rays pictures to her previously. She states though that her symptoms are now worsening with her groin pain and difficulty with activities of daily living that she would like to consider possibly having a total hip replacement. .   Review of Systems  Constitutional: Negative.   HENT: Negative.   Respiratory: Negative.   Cardiovascular:       History of hypertension and cardiac murmur  Gastrointestinal: Negative.   Genitourinary:       Nocturia 2-3 times per evening  Skin:  Negative.   Neurological: Negative.   Hematological: Negative.   Psychiatric/Behavioral: Negative.      Objective: Vital Signs: BP (!) 138/44 (BP Location: Right Arm, Patient Position: Sitting, Cuff Size: Normal)   Pulse 68   Temp 98 F (36.7 C)   Resp 14   Ht 5' 3.5" (1.613 m)   Wt 166 lb (75.3 kg)   BMI 28.94 kg/m   Physical Exam  Constitutional: She is oriented to person, place, and time. She appears well-developed and well-nourished.  HENT:  Head: Normocephalic and atraumatic.  Eyes: Conjunctivae and EOM are normal. Pupils are equal, round, and reactive to light.  Neck: Neck supple.  Cardiovascular: Normal rate, regular rhythm and intact distal pulses.   Murmur (Grade 2/6 systolic ejection murmur) heard. Pulmonary/Chest: Effort normal and breath sounds normal.  Abdominal: Soft. Bowel sounds are normal. There is no tenderness.  Neurological: She is alert and oriented to person, place, and time.  Skin: Skin is warm and dry.  Psychiatric: She has a normal mood and affect. Her behavior is normal. Judgment and thought content normal.    Right Hip Exam   Tenderness  The patient is experiencing no tenderness.     Range of Motion  Flexion: 100  Internal Rotation: 5  External Rotation: 10  Abduction: 25  Other  Sensation: normal Pulse: present      Specialty Comments:  No specialty comments available.  Imaging: Radiographic studies dated 06/11/2016 reveals appear medial bone-on-bone of the femoral acetabular area. Multiple cysts more in the femoral head than in the acetabular area. Medial marked loss of joint space.  PMFS History: Patient Active Problem List   Diagnosis Date Noted  . GERD (gastroesophageal reflux disease) 08/03/2015  . Hemolytic anemia ([place]) 07/12/2015  . Chronic venous insufficiency 08/10/2014  . Aortic stenosis, mild 06/29/2014  . Left ventricular diastolic dysfunction, NYHA class 1 06/29/2014  . Senile osteopenia 04/21/2014  .  Hyperlipidemia with target LDL less than 130 04/21/2014  . Kidney disease, chronic, stage III (GFR 30-59 ml/min) 04/21/2014  . Routine health maintenance 07/16/2012  . Carotid artery stenosis without cerebral infarction 01/16/2010  . GEN OSTEOARTHROSIS INVOLVING MULTIPLE SITES 11/11/2008  . Essential hypertension 10/19/2007   Past Medical History:  Diagnosis Date  . ANEMIA 08/22/2009  . CAROTID ARTERY STENOSIS 01/16/2010  . Complication of anesthesia    very hard to wake up from surgery  . DYSPNEA ON EXERTION 11/16/2007  . Gallstones   . GEN OSTEOARTHROSIS INVOLVING MULTIPLE SITES 11/11/2008  . HEMORRHOIDS, INTERNAL    surgery was in the 90's (per patient)  . NECK PAIN, ACUTE 12/28/2009  . PERIPHERAL EDEMA 10/06/2007  . PVD 10/06/2007  . Unspecified essential hypertension 10/19/2007    Family History  Problem Relation Age of Onset  . Ovarian cancer Mother   . Cancer Mother   . Leukemia Father   . Lung cancer Father   . Cancer Father   . Cancer Brother   . Diabetes Neg Hx   . Heart disease Neg Hx   . Hypertension Neg Hx     Past Surgical History:  Procedure Laterality Date  . ABDOMINAL HYSTERECTOMY    . CHOLECYSTECTOMY  01/29/2016   at [facility]  . ENDOSCOPIC RETROGRADE CHOLANGIOPANCREATOGRAPHY (ERCP) WITH PROPOFOL N/A 12/01/2015   Procedure: ENDOSCOPIC RETROGRADE CHOLANGIOPANCREATOGRAPHY (ERCP) WITH PROPOFOL;  Surgeon: [name], MD;  [facility];  Service: Endoscopy;  Laterality: N/A;  . ENDOSCOPIC RETROGRADE CHOLANGIOPANCREATOGRAPHY (ERCP) WITH PROPOFOL N/A 02/01/2016   Procedure: ENDOSCOPIC RETROGRADE CHOLANGIOPANCREATOGRAPHY (ERCP) WITH PROPOFOL;  Surgeon: [name], MD;  [facility];  Service: Endoscopy;  Laterality: N/A;  . Hyperplastic colon polyps, removed  2007   By Dr. Penelope Coop  . JOINT REPLACEMENT  2011   right knee  . OOPHORECTOMY    . ROTATOR CUFF REPAIR  2004   left (Dr. Durward Fortes)  . TOTAL KNEE ARTHROPLASTY  Right    Social History   Occupational History  . Network engineer Retired    retired   Social History Main Topics  . Smoking status: Never Smoker  . Smokeless tobacco: Never Used  . Alcohol use No  . Drug use: No  . Sexual activity: Not Currently

## 2016-11-11 ENCOUNTER — Other Ambulatory Visit (HOSPITAL_COMMUNITY): Payer: Medicare Other

## 2016-11-12 ENCOUNTER — Other Ambulatory Visit (HOSPITAL_COMMUNITY): Payer: Medicare Other

## 2016-11-12 ENCOUNTER — Telehealth (INDEPENDENT_AMBULATORY_CARE_PROVIDER_SITE_OTHER): Payer: Self-pay | Admitting: Orthopaedic Surgery

## 2016-11-12 NOTE — Telephone Encounter (Signed)
Patient was told to stop taking her meloxacan and she needs to know if she can take tramadol. Patient states she stopped taking the meloxacan her pain has gotten worse. Patient cb# [phone] Please leave message if she doesn't answer

## 2016-11-12 NOTE — Telephone Encounter (Signed)
Please advise 

## 2016-11-12 NOTE — Telephone Encounter (Signed)
Ok to use tramadol as needed

## 2016-11-12 NOTE — Pre-Procedure Instructions (Signed)
OLUWADAMILOLA DEALMEIDA  11/12/2016      [id number] - [place], [place] [address] Phone: [phone] [phone]    Your procedure is scheduled on Tuesday, January 2.  Report to [facility] Admitting at 5:30 AM                    Your surgery or procedure is scheduled for 7:15 AM   Call this number if you have problems the morning of surgery: [phone]     Remember:  Do not eat food or drink liquids after midnight Monday, January 1.  Take these medicines the morning of surgery with A SIP OF WATER : carvedilol (COREG), pantoprazole (PROTONIX).              May take Tramadol (Ultram) if needed.              1 Week prior to surgery STOP taking Aspirin, Aspirin Products (Goody Powder, Excedrin Migraine), Ibuprofen (Advil), Naproxen (Aleve), Vitimins and Herbal Products (ie Fish Oil)                           [place]- Preparing For Surgery  Before surgery, you can play an important role. Because skin is not sterile, your skin needs to be as free of germs as possible. You can reduce the number of germs on your skin by washing with CHG (chlorahexidine gluconate) Soap before surgery.  CHG is an antiseptic cleaner which kills germs and bonds with the skin to continue killing germs even after washing.  Please do not use if you have an allergy to CHG or antibacterial soaps. If your skin becomes reddened/irritated stop using the CHG.  Do not shave (including legs and underarms) for at least 48 hours prior to first CHG shower. It is OK to shave your face.  Please follow these instructions carefully.   1. Shower the NIGHT BEFORE SURGERY and the MORNING OF SURGERY with CHG.   2. If you chose to wash your hair, wash your hair first as usual with your normal shampoo.  3. After you shampoo, rinse your hair and body thoroughly to remove the shampoo.  4. Use CHG as you would any other liquid soap. You can apply CHG  directly to the skin and wash gently with a scrungie or a clean washcloth.   5. Apply the CHG Soap to your body ONLY FROM THE NECK DOWN.  Do not use on open wounds or open sores. Avoid contact with your eyes, ears, mouth and genitals (private parts). Wash genitals (private parts) with your normal soap.  6. Wash thoroughly, paying special attention to the area where your surgery will be performed.  7. Thoroughly rinse your body with warm water from the neck down.  8. DO NOT shower/wash with your normal soap after using and rinsing off the CHG Soap.  9. Pat yourself dry with a CLEAN TOWEL.   10. Wear CLEAN PAJAMAS   11. Place CLEAN SHEETS on your bed the night of your first shower and DO NOT SLEEP WITH PETS.  Day of Surgery: Do not apply any deodorants/lotions. Please wear clean clothes to the hospital/surgery center.    Do not wear jewelry, make-up or nail polish.  Do not wear lotions, powders, or perfumes, or deodorant.  Do not shave 48 hours prior to surgery.    Do not bring valuables to  the hospital.  [facility] is not responsible for any belongings or valuables.  Contacts, dentures or bridgework may not be worn into surgery.  Leave your suitcase in the car.  After surgery it may be brought to your room.  For patients admitted to the hospital, discharge time will be determined by your treatment team.  Patients discharged the day of surgery will not be allowed to drive home.   Special instructions:[facility]- Preparing for Surgery  Please read over the following fact sheets that you were given:  Patient Instructions for Mupirocin Application, Incentive Spirometry, Pain Booklet

## 2016-11-13 ENCOUNTER — Encounter (HOSPITAL_COMMUNITY)
Admission: RE | Admit: 2016-11-13 | Discharge: 2016-11-13 | Disposition: A | Discharge status: Home / Self Care | Payer: Medicare Other | Source: Ambulatory Visit | Attending: Orthopaedic Surgery | Admitting: Orthopaedic Surgery

## 2016-11-13 ENCOUNTER — Encounter (HOSPITAL_COMMUNITY): Payer: Self-pay

## 2016-11-13 ENCOUNTER — Ambulatory Visit (HOSPITAL_COMMUNITY)
Admission: RE | Admit: 2016-11-13 | Discharge: 2016-11-13 | Disposition: A | Discharge status: Home / Self Care | Payer: Medicare Other | Source: Ambulatory Visit | Attending: Orthopedic Surgery | Admitting: Orthopedic Surgery

## 2016-11-13 DIAGNOSIS — M5134 Other intervertebral disc degeneration, thoracic region: Secondary | ICD-10-CM | POA: Diagnosis not present

## 2016-11-13 DIAGNOSIS — R938 Abnormal findings on diagnostic imaging of other specified body structures: Secondary | ICD-10-CM | POA: Insufficient documentation

## 2016-11-13 DIAGNOSIS — Z01818 Encounter for other preprocedural examination: Secondary | ICD-10-CM

## 2016-11-13 DIAGNOSIS — I700 Atherosclerosis of aorta: Secondary | ICD-10-CM | POA: Diagnosis not present

## 2016-11-13 DIAGNOSIS — J40 Bronchitis, not specified as acute or chronic: Secondary | ICD-10-CM | POA: Diagnosis not present

## 2016-11-13 HISTORY — DX: Gastro-esophageal reflux disease without esophagitis: K21.9

## 2016-11-13 LAB — CBC WITH DIFFERENTIAL/PLATELET
BASOS ABS: 0 10*3/uL (ref 0.0–0.1)
BASOS PCT: 0 %
EOS ABS: 0.2 10*3/uL (ref 0.0–0.7)
EOS PCT: 3 %
HCT: 30.4 % — ABNORMAL LOW (ref 36.0–46.0)
Hemoglobin: 10.1 g/dL — ABNORMAL LOW (ref 12.0–15.0)
Lymphocytes Relative: 14 %
Lymphs Abs: 1.2 10*3/uL (ref 0.7–4.0)
MCH: 30.1 pg (ref 26.0–34.0)
MCHC: 33.2 g/dL (ref 30.0–36.0)
MCV: 90.7 fL (ref 78.0–100.0)
MONO ABS: 0.8 10*3/uL (ref 0.1–1.0)
MONOS PCT: 10 %
Neutro Abs: 6.2 10*3/uL (ref 1.7–7.7)
Neutrophils Relative %: 73 %
PLATELETS: 232 10*3/uL (ref 150–400)
RBC: 3.35 MIL/uL — ABNORMAL LOW (ref 3.87–5.11)
RDW: 19.2 % — AB (ref 11.5–15.5)
WBC: 8.5 10*3/uL (ref 4.0–10.5)

## 2016-11-13 LAB — COMPREHENSIVE METABOLIC PANEL
ALBUMIN: 4.2 g/dL (ref 3.5–5.0)
ALT: 16 U/L (ref 14–54)
ANION GAP: 7 (ref 5–15)
AST: 23 U/L (ref 15–41)
Alkaline Phosphatase: 78 U/L (ref 38–126)
BILIRUBIN TOTAL: 3 mg/dL — AB (ref 0.3–1.2)
BUN: 21 mg/dL — ABNORMAL HIGH (ref 6–20)
CHLORIDE: 104 mmol/L (ref 101–111)
CO2: 25 mmol/L (ref 22–32)
Calcium: 9.1 mg/dL (ref 8.9–10.3)
Creatinine, Ser: 1.17 mg/dL — ABNORMAL HIGH (ref 0.44–1.00)
GFR calc Af Amer: 49 mL/min — ABNORMAL LOW (ref >60)
GFR calc non Af Amer: 42 mL/min — ABNORMAL LOW (ref >60)
GLUCOSE: 105 mg/dL — AB (ref 65–99)
POTASSIUM: 4.1 mmol/L (ref 3.5–5.1)
SODIUM: 136 mmol/L (ref 135–145)
TOTAL PROTEIN: 6.2 g/dL — AB (ref 6.5–8.1)

## 2016-11-13 LAB — APTT: APTT: 30 s (ref 24–36)

## 2016-11-13 LAB — PROTIME-INR
INR: 1.25
Prothrombin Time: 15.8 seconds — ABNORMAL HIGH (ref 11.4–15.2)

## 2016-11-13 LAB — TYPE AND SCREEN
ABO/RH(D): A POS
ANTIBODY SCREEN: NEGATIVE

## 2016-11-13 LAB — SURGICAL PCR SCREEN
MRSA, PCR: NEGATIVE
STAPHYLOCOCCUS AUREUS: NEGATIVE

## 2016-11-13 NOTE — Telephone Encounter (Signed)
Made 2 attempts to call pt, line busy?

## 2016-11-14 LAB — URINE CULTURE: Special Requests: NORMAL

## 2016-11-14 NOTE — H&P (Addendum)
Office Visit Note              Patient: Claudia Lara                                        Date of Birth: Jan 21, 1932                                                   MRN: [id number] Visit Date: 11/06/2016                                                                     Requested by: Claudia Lima, MD [address] [place], [place] [address] PCP: Claudia Calico, MD   Assessment & Plan: Visit Diagnoses:  1. Unilateral primary osteoarthritis, right hip     Plan: Right total hip replacement Procedure risks and benefits were fully explained to patient she is understanding. Over the suture will entail as well as the postoperative course. Her plan is to go to [place] place after surgery. Complications which are gone into detail.  Follow-Up Instructions: No Follow-up on file.   Orders:  No orders of the defined types were placed in this encounter.  No orders of the defined types were placed in this encounter.     Procedures: No procedures performed   Clinical Data: No additional findings.   Subjective:    Chief Complaint  Patient presents with  . Right Hip - Pain    HPI Claudia Lara is an 82 year old white female who comes in today for complaints of pain in her right lateral hip as well as into her groin. She's noticed now that is having difficulty with get her shoes and socks on because she has limited motion in her right hip. Having pain with every step. She does have some nighttime pain when she does try to roll over. She states she knows that she has fairly significant arthritis in her right hip and we have given her x-rays pictures to her previously. She states though that her symptoms are now worsening with her groin pain and difficulty with activities of daily living that she would like to consider possibly having a total hip replacement. .   Review of Systems  Constitutional: Negative.   HENT: Negative.   Respiratory: Negative.     Cardiovascular:       History of hypertension and cardiac murmur  Gastrointestinal: Negative.   Genitourinary:       Nocturia 2-3 times per evening  Skin: Negative.   Neurological: Negative.   Hematological: Negative.   Psychiatric/Behavioral: Negative.      Objective: Vital Signs: BP (!) 138/44 (BP Location: Right Arm, Patient Position: Sitting, Cuff Size: Normal)   Pulse 68   Temp 98 F (36.7 C)   Resp 14   Ht 5' 3.5" (1.613 m)   Wt 166 lb (75.3 kg)   BMI 28.94 kg/m   Physical Exam  Constitutional: She is oriented to person, place, and time. She appears well-developed and well-nourished.  HENT:  Head: Normocephalic and atraumatic.  Eyes: Conjunctivae and EOM are normal. Pupils are equal, round, and reactive to light.  Neck: Neck supple.  Cardiovascular: Normal rate, regular rhythm and intact distal pulses.   Murmur (Grade 2/6 systolic ejection murmur) heard. Pulmonary/Chest: Effort normal and breath sounds normal.  Abdominal: Soft. Bowel sounds are normal. There is no tenderness.  Neurological: She is alert and oriented to person, place, and time.  Skin: Skin is warm and dry.  Psychiatric: She has a normal mood and affect. Her behavior is normal. Judgment and thought content normal.    Right Hip Exam   Tenderness  The patient is experiencing no tenderness.     Range of Motion  Flexion: 100  Internal Rotation: 5  External Rotation: 10  Abduction: 25   Other  Sensation: normal Pulse: present     Specialty Comments:  No specialty comments available.  Imaging: Radiographic studies dated 06/11/2016 reveals appear medial bone-on-bone of the femoral acetabular area. Multiple cysts more in the femoral head than in the acetabular area. Medial marked loss of joint space.  Current Facility-Administered Medications  Medication Dose Route Frequency Provider Last Rate Last Dose  . 0.9 %  sodium chloride infusion  75 mL/hr Intravenous Continuous  Mike Craze Melida [place], PA-C      . acetaminophen (OFIRMEV) IV 1,000 mg  1,000 mg Intravenous Once Clova Morlock D Zelda Reames, PA-C      . ceFAZolin (ANCEF) IVPB 2g/100 mL premix  2 g Intravenous On Call to [place], [place]      . chlorhexidine (HIBICLENS) 4 % liquid 4 application  60 mL Topical Once Cherylann Ratel, PA-C      . chlorhexidine (HIBICLENS) 4 % liquid 4 application  60 mL Topical Once Shandon Bur[place]ame D Ritik Stavola, PA-C      . tranexamic acid (CYKLOKAPRON) 2,000 mg in sodium chloride 0.9 % 50 mL Topical Application  123XX123 mg Topical To OR Terrace Arabia, RPH        PMFS History:     Patient Active Problem List   Diagnosis Date Noted  . GERD (gastroesophageal reflux disease) 08/03/2015  . Hemolytic anemia ([place]) 07/12/2015  . Chronic venous insufficiency 08/10/2014  . Aortic stenosis, mild 06/29/2014  . Left ventricular diastolic dysfunction, NYHA class 1 06/29/2014  . Senile osteopenia 04/21/2014  . Hyperlipidemia with target LDL less than 130 04/21/2014  . Kidney disease, chronic, stage III (GFR 30-59 ml/min) 04/21/2014  . Routine health maintenance 07/16/2012  . Carotid artery stenosis without cerebral infarction 01/16/2010  . GEN OSTEOARTHROSIS INVOLVING MULTIPLE SITES 11/11/2008  . Essential hypertension 10/19/2007       Past Medical History:  Diagnosis Date  . ANEMIA 08/22/2009  . CAROTID ARTERY STENOSIS 01/16/2010  . Complication of anesthesia    very hard to wake up from surgery  . DYSPNEA ON EXERTION 11/16/2007  . Gallstones   . GEN OSTEOARTHROSIS INVOLVING MULTIPLE SITES 11/11/2008  . HEMORRHOIDS, INTERNAL    surgery was in the 90's (per patient)  . NECK PAIN, ACUTE 12/28/2009  . PERIPHERAL EDEMA 10/06/2007  . PVD 10/06/2007  . Unspecified essential hypertension 10/19/2007         Family History  Problem Relation Age of Onset  . Ovarian cancer Mother   . Cancer Mother   . Leukemia Father   . Lung cancer Father   . Cancer Father   . Cancer Brother     . Diabetes Neg Hx   . Heart disease Neg  Hx   . Hypertension Neg Hx          Past Surgical History:  Procedure Laterality Date  . ABDOMINAL HYSTERECTOMY    . CHOLECYSTECTOMY  01/29/2016   at [facility]  . ENDOSCOPIC RETROGRADE CHOLANGIOPANCREATOGRAPHY (ERCP) WITH PROPOFOL N/A 12/01/2015   Procedure: ENDOSCOPIC RETROGRADE CHOLANGIOPANCREATOGRAPHY (ERCP) WITH PROPOFOL;  Surgeon: [name], MD;  [facility];  Service: Endoscopy;  Laterality: N/A;  . ENDOSCOPIC RETROGRADE CHOLANGIOPANCREATOGRAPHY (ERCP) WITH PROPOFOL N/A 02/01/2016   Procedure: ENDOSCOPIC RETROGRADE CHOLANGIOPANCREATOGRAPHY (ERCP) WITH PROPOFOL;  Surgeon: [name], MD;  [facility];  Service: Endoscopy;  Laterality: N/A;  . Hyperplastic colon polyps, removed  2007   By Dr. Penelope Coop  . JOINT REPLACEMENT  2011   right knee  . OOPHORECTOMY    . ROTATOR CUFF REPAIR  2004   left (Dr. Durward Fortes)  . TOTAL KNEE ARTHROPLASTY Right    Social History        Occupational History  . Network engineer Retired    retired       Social History Main Topics  . Smoking status: Never Smoker  . Smokeless tobacco: Never Used  . Alcohol use No  . Drug use: No  . Sexual activity: Not Currently     Mike Craze. [place], [place] [phone]  11/26/2016 7:20 AM

## 2016-11-25 MED ORDER — CEFAZOLIN SODIUM-DEXTROSE 2-4 GM/100ML-% IV SOLN
2.0000 g | INTRAVENOUS | Status: AC
  Administered 2016-11-26: 2 g via INTRAVENOUS
  Filled 2016-11-25: qty 100

## 2016-11-25 MED ORDER — TRANEXAMIC ACID 1000 MG/10ML IV SOLN
2000.0000 mg | INTRAVENOUS | Status: DC
  Filled 2016-11-25 (×2): qty 20

## 2016-11-26 ENCOUNTER — Inpatient Hospital Stay (HOSPITAL_COMMUNITY)
Admission: RE | Admit: 2016-11-26 | Discharge: 2016-11-28 | DRG: 470 | Disposition: A | Discharge status: Skilled Nursing Facility | Payer: Medicare Other | Source: Ambulatory Visit | Attending: Orthopaedic Surgery | Admitting: Orthopaedic Surgery

## 2016-11-26 ENCOUNTER — Encounter (HOSPITAL_COMMUNITY)
Admission: RE | Disposition: A | Discharge status: Skilled Nursing Facility | Payer: Self-pay | Source: Ambulatory Visit | Attending: Orthopaedic Surgery

## 2016-11-26 ENCOUNTER — Inpatient Hospital Stay (HOSPITAL_COMMUNITY): Payer: Medicare Other | Admitting: Certified Registered Nurse Anesthetist

## 2016-11-26 ENCOUNTER — Inpatient Hospital Stay (HOSPITAL_COMMUNITY): Payer: Medicare Other

## 2016-11-26 ENCOUNTER — Encounter (HOSPITAL_COMMUNITY): Payer: Self-pay | Admitting: *Deleted

## 2016-11-26 DIAGNOSIS — M25551 Pain in right hip: Secondary | ICD-10-CM | POA: Diagnosis present

## 2016-11-26 DIAGNOSIS — Z806 Family history of leukemia: Secondary | ICD-10-CM | POA: Diagnosis not present

## 2016-11-26 DIAGNOSIS — I739 Peripheral vascular disease, unspecified: Secondary | ICD-10-CM | POA: Diagnosis not present

## 2016-11-26 DIAGNOSIS — Z8041 Family history of malignant neoplasm of ovary: Secondary | ICD-10-CM | POA: Diagnosis not present

## 2016-11-26 DIAGNOSIS — M1612 Unilateral primary osteoarthritis, left hip: Secondary | ICD-10-CM | POA: Diagnosis not present

## 2016-11-26 DIAGNOSIS — R262 Difficulty in walking, not elsewhere classified: Secondary | ICD-10-CM | POA: Diagnosis not present

## 2016-11-26 DIAGNOSIS — I10 Essential (primary) hypertension: Secondary | ICD-10-CM | POA: Diagnosis not present

## 2016-11-26 DIAGNOSIS — I6529 Occlusion and stenosis of unspecified carotid artery: Secondary | ICD-10-CM | POA: Diagnosis not present

## 2016-11-26 DIAGNOSIS — Z801 Family history of malignant neoplasm of trachea, bronchus and lung: Secondary | ICD-10-CM | POA: Diagnosis not present

## 2016-11-26 DIAGNOSIS — Z9889 Other specified postprocedural states: Secondary | ICD-10-CM

## 2016-11-26 DIAGNOSIS — M8580 Other specified disorders of bone density and structure, unspecified site: Secondary | ICD-10-CM | POA: Diagnosis not present

## 2016-11-26 DIAGNOSIS — Z96651 Presence of right artificial knee joint: Secondary | ICD-10-CM | POA: Diagnosis present

## 2016-11-26 DIAGNOSIS — M1611 Unilateral primary osteoarthritis, right hip: Secondary | ICD-10-CM | POA: Diagnosis not present

## 2016-11-26 DIAGNOSIS — Z96649 Presence of unspecified artificial hip joint: Secondary | ICD-10-CM

## 2016-11-26 DIAGNOSIS — I350 Nonrheumatic aortic (valve) stenosis: Secondary | ICD-10-CM | POA: Diagnosis not present

## 2016-11-26 DIAGNOSIS — I129 Hypertensive chronic kidney disease with stage 1 through stage 4 chronic kidney disease, or unspecified chronic kidney disease: Secondary | ICD-10-CM | POA: Diagnosis present

## 2016-11-26 DIAGNOSIS — Z96641 Presence of right artificial hip joint: Secondary | ICD-10-CM

## 2016-11-26 DIAGNOSIS — N183 Chronic kidney disease, stage 3 (moderate): Secondary | ICD-10-CM | POA: Diagnosis not present

## 2016-11-26 DIAGNOSIS — K219 Gastro-esophageal reflux disease without esophagitis: Secondary | ICD-10-CM | POA: Diagnosis not present

## 2016-11-26 DIAGNOSIS — M25559 Pain in unspecified hip: Secondary | ICD-10-CM | POA: Diagnosis not present

## 2016-11-26 DIAGNOSIS — E785 Hyperlipidemia, unspecified: Secondary | ICD-10-CM | POA: Diagnosis present

## 2016-11-26 DIAGNOSIS — I872 Venous insufficiency (chronic) (peripheral): Secondary | ICD-10-CM | POA: Diagnosis not present

## 2016-11-26 DIAGNOSIS — M6281 Muscle weakness (generalized): Secondary | ICD-10-CM | POA: Diagnosis not present

## 2016-11-26 DIAGNOSIS — M1990 Unspecified osteoarthritis, unspecified site: Secondary | ICD-10-CM | POA: Diagnosis present

## 2016-11-26 DIAGNOSIS — Z471 Aftercare following joint replacement surgery: Secondary | ICD-10-CM | POA: Diagnosis not present

## 2016-11-26 HISTORY — PX: TOTAL HIP ARTHROPLASTY: SHX124

## 2016-11-26 LAB — CBC
HEMATOCRIT: 24.7 % — AB (ref 36.0–46.0)
HEMOGLOBIN: 8.2 g/dL — AB (ref 12.0–15.0)
MCH: 30.5 pg (ref 26.0–34.0)
MCHC: 33.2 g/dL (ref 30.0–36.0)
MCV: 91.8 fL (ref 78.0–100.0)
Platelets: 178 10*3/uL (ref 150–400)
RBC: 2.69 MIL/uL — ABNORMAL LOW (ref 3.87–5.11)
RDW: 19.5 % — ABNORMAL HIGH (ref 11.5–15.5)
WBC: 9.5 10*3/uL (ref 4.0–10.5)

## 2016-11-26 SURGERY — ARTHROPLASTY, HIP, TOTAL,POSTERIOR APPROACH
Anesthesia: Monitor Anesthesia Care | Site: Hip | Laterality: Right

## 2016-11-26 MED ORDER — POLYETHYLENE GLYCOL 3350 17 G PO PACK: 17.0000 g | PACK | Freq: Every day | ORAL | Status: DC | PRN

## 2016-11-26 MED ORDER — LACTATED RINGERS IV SOLN
INTRAVENOUS | Status: DC | PRN
  Administered 2016-11-26 (×2): via INTRAVENOUS

## 2016-11-26 MED ORDER — FENTANYL CITRATE (PF) 100 MCG/2ML IJ SOLN
INTRAMUSCULAR | Status: AC
  Filled 2016-11-26: qty 2

## 2016-11-26 MED ORDER — ONDANSETRON HCL 4 MG/2ML IJ SOLN: 4.0000 mg | Freq: Four times a day (QID) | INTRAMUSCULAR | Status: DC | PRN

## 2016-11-26 MED ORDER — TRANEXAMIC ACID 1000 MG/10ML IV SOLN
INTRAVENOUS | Status: DC | PRN
  Administered 2016-11-26: 2000 mg via TOPICAL

## 2016-11-26 MED ORDER — HYDROMORPHONE HCL 1 MG/ML IJ SOLN: 0.2500 mg | INTRAMUSCULAR | Status: DC | PRN

## 2016-11-26 MED ORDER — BISACODYL 10 MG RE SUPP: 10.0000 mg | Freq: Every day | RECTAL | Status: DC | PRN

## 2016-11-26 MED ORDER — ONDANSETRON HCL 4 MG PO TABS: 4.0000 mg | ORAL_TABLET | Freq: Four times a day (QID) | ORAL | Status: DC | PRN

## 2016-11-26 MED ORDER — ACETAMINOPHEN 10 MG/ML IV SOLN
1000.0000 mg | Freq: Four times a day (QID) | INTRAVENOUS | Status: AC
  Administered 2016-11-26 – 2016-11-27 (×4): 1000 mg via INTRAVENOUS
  Filled 2016-11-26 (×4): qty 100

## 2016-11-26 MED ORDER — METHOCARBAMOL 1000 MG/10ML IJ SOLN
500.0000 mg | Freq: Four times a day (QID) | INTRAVENOUS | Status: DC | PRN
  Filled 2016-11-26: qty 5

## 2016-11-26 MED ORDER — EPINEPHRINE PF 1 MG/ML IJ SOLN
INTRAMUSCULAR | Status: AC
  Filled 2016-11-26: qty 1

## 2016-11-26 MED ORDER — KETOROLAC TROMETHAMINE 15 MG/ML IJ SOLN
7.5000 mg | Freq: Four times a day (QID) | INTRAMUSCULAR | Status: AC
  Administered 2016-11-26 – 2016-11-27 (×4): 7.5 mg via INTRAVENOUS
  Filled 2016-11-26 (×4): qty 1

## 2016-11-26 MED ORDER — VITAMIN D 1000 UNITS PO TABS
1000.0000 [IU] | ORAL_TABLET | Freq: Every morning | ORAL | Status: DC
  Administered 2016-11-27 – 2016-11-28 (×2): 1000 [IU] via ORAL
  Filled 2016-11-26 (×2): qty 1

## 2016-11-26 MED ORDER — ONDANSETRON HCL 4 MG/2ML IJ SOLN: 4.0000 mg | Freq: Once | INTRAMUSCULAR | Status: DC | PRN

## 2016-11-26 MED ORDER — PROPOFOL 10 MG/ML IV BOLUS
INTRAVENOUS | Status: DC | PRN
  Administered 2016-11-26: 20 mg via INTRAVENOUS

## 2016-11-26 MED ORDER — CALCIUM CARBONATE-VITAMIN D 500-200 MG-UNIT PO TABS
1.0000 | ORAL_TABLET | Freq: Every evening | ORAL | Status: DC
  Administered 2016-11-26 – 2016-11-27 (×2): 1 via ORAL
  Filled 2016-11-26 (×2): qty 1

## 2016-11-26 MED ORDER — MEPERIDINE HCL 25 MG/ML IJ SOLN: 6.2500 mg | INTRAMUSCULAR | Status: DC | PRN

## 2016-11-26 MED ORDER — MENTHOL 3 MG MT LOZG: 1.0000 | LOZENGE | OROMUCOSAL | Status: DC | PRN

## 2016-11-26 MED ORDER — PROPOFOL 500 MG/50ML IV EMUL
INTRAVENOUS | Status: DC | PRN
Start: 2016-11-26 — End: 2016-11-26
  Administered 2016-11-26: 25 ug/kg/min via INTRAVENOUS

## 2016-11-26 MED ORDER — SODIUM CHLORIDE 0.9 % IV SOLN: 75.0000 mL/h | INTRAVENOUS | Status: DC

## 2016-11-26 MED ORDER — FENTANYL CITRATE (PF) 100 MCG/2ML IJ SOLN
INTRAMUSCULAR | Status: DC | PRN
  Administered 2016-11-26 (×2): 50 ug via INTRAVENOUS

## 2016-11-26 MED ORDER — ADULT MULTIVITAMIN W/MINERALS CH
1.0000 | ORAL_TABLET | Freq: Every day | ORAL | Status: DC
  Administered 2016-11-27 – 2016-11-28 (×2): 1 via ORAL
  Filled 2016-11-26 (×2): qty 1

## 2016-11-26 MED ORDER — HYDROMORPHONE HCL 2 MG/ML IJ SOLN: 0.5000 mg | INTRAMUSCULAR | Status: DC | PRN

## 2016-11-26 MED ORDER — CHLORHEXIDINE GLUCONATE 4 % EX LIQD: 60.0000 mL | Freq: Once | CUTANEOUS | Status: DC

## 2016-11-26 MED ORDER — BUPIVACAINE HCL (PF) 0.25 % IJ SOLN
INTRAMUSCULAR | Status: AC
  Filled 2016-11-26: qty 30

## 2016-11-26 MED ORDER — MIDAZOLAM HCL 2 MG/2ML IJ SOLN
INTRAMUSCULAR | Status: AC
  Filled 2016-11-26: qty 2

## 2016-11-26 MED ORDER — DIPHENHYDRAMINE HCL 12.5 MG/5ML PO ELIX: 12.5000 mg | ORAL_SOLUTION | ORAL | Status: DC | PRN

## 2016-11-26 MED ORDER — MAGNESIUM CITRATE PO SOLN: 1.0000 | Freq: Once | ORAL | Status: DC | PRN

## 2016-11-26 MED ORDER — CARVEDILOL 12.5 MG PO TABS
12.5000 mg | ORAL_TABLET | Freq: Two times a day (BID) | ORAL | Status: DC
  Administered 2016-11-26 – 2016-11-28 (×4): 12.5 mg via ORAL
  Filled 2016-11-26 (×4): qty 1

## 2016-11-26 MED ORDER — PHENOL 1.4 % MT LIQD: 1.0000 | OROMUCOSAL | Status: DC | PRN

## 2016-11-26 MED ORDER — OXYCODONE HCL 5 MG PO TABS
5.0000 mg | ORAL_TABLET | ORAL | Status: DC | PRN
  Administered 2016-11-26: 10 mg via ORAL
  Administered 2016-11-27 – 2016-11-28 (×4): 5 mg via ORAL
  Filled 2016-11-26: qty 1
  Filled 2016-11-26: qty 2
  Filled 2016-11-26 (×3): qty 1

## 2016-11-26 MED ORDER — DOCUSATE SODIUM 100 MG PO CAPS
100.0000 mg | ORAL_CAPSULE | Freq: Two times a day (BID) | ORAL | Status: DC
  Administered 2016-11-26 – 2016-11-28 (×4): 100 mg via ORAL
  Filled 2016-11-26 (×4): qty 1

## 2016-11-26 MED ORDER — PANTOPRAZOLE SODIUM 40 MG PO TBEC
40.0000 mg | DELAYED_RELEASE_TABLET | Freq: Every day | ORAL | Status: DC
  Administered 2016-11-27 – 2016-11-28 (×2): 40 mg via ORAL
  Filled 2016-11-26 (×2): qty 1

## 2016-11-26 MED ORDER — ACETAMINOPHEN 10 MG/ML IV SOLN
INTRAVENOUS | Status: AC
Start: 2016-11-26 — End: 2016-11-26
  Filled 2016-11-26: qty 100

## 2016-11-26 MED ORDER — METHOCARBAMOL 500 MG PO TABS
500.0000 mg | ORAL_TABLET | Freq: Four times a day (QID) | ORAL | Status: DC | PRN
  Administered 2016-11-28: 500 mg via ORAL
  Filled 2016-11-26: qty 1

## 2016-11-26 MED ORDER — CEFAZOLIN SODIUM-DEXTROSE 2-4 GM/100ML-% IV SOLN
2.0000 g | Freq: Four times a day (QID) | INTRAVENOUS | Status: AC
  Administered 2016-11-26 (×2): 2 g via INTRAVENOUS
  Filled 2016-11-26 (×4): qty 100

## 2016-11-26 MED ORDER — MIDAZOLAM HCL 5 MG/5ML IJ SOLN
INTRAMUSCULAR | Status: DC | PRN
  Administered 2016-11-26 (×2): 1 mg via INTRAVENOUS

## 2016-11-26 MED ORDER — PHENYLEPHRINE HCL 10 MG/ML IJ SOLN
INTRAVENOUS | Status: DC | PRN
  Administered 2016-11-26: 25 ug/min via INTRAVENOUS

## 2016-11-26 MED ORDER — VITAMIN C 500 MG PO TABS
500.0000 mg | ORAL_TABLET | Freq: Every day | ORAL | Status: DC
  Administered 2016-11-27 – 2016-11-28 (×2): 500 mg via ORAL
  Filled 2016-11-26 (×2): qty 1

## 2016-11-26 MED ORDER — RIVAROXABAN 10 MG PO TABS
10.0000 mg | ORAL_TABLET | Freq: Every day | ORAL | Status: DC
  Administered 2016-11-27 – 2016-11-28 (×2): 10 mg via ORAL
  Filled 2016-11-26 (×2): qty 1

## 2016-11-26 MED ORDER — BUPIVACAINE-EPINEPHRINE (PF) 0.25% -1:200000 IJ SOLN
INTRAMUSCULAR | Status: DC | PRN
  Administered 2016-11-26: 30 mL via PERINEURAL

## 2016-11-26 MED ORDER — BUPIVACAINE HCL (PF) 0.5 % IJ SOLN
INTRAMUSCULAR | Status: DC | PRN
  Administered 2016-11-26: 3 mL via INTRATHECAL

## 2016-11-26 MED ORDER — EPHEDRINE SULFATE 50 MG/ML IJ SOLN
INTRAMUSCULAR | Status: DC | PRN
  Administered 2016-11-26: 5 mg via INTRAVENOUS

## 2016-11-26 MED ORDER — METOCLOPRAMIDE HCL 5 MG/ML IJ SOLN: 5.0000 mg | Freq: Three times a day (TID) | INTRAMUSCULAR | Status: DC | PRN

## 2016-11-26 MED ORDER — METOCLOPRAMIDE HCL 5 MG PO TABS: 5.0000 mg | ORAL_TABLET | Freq: Three times a day (TID) | ORAL | Status: DC | PRN

## 2016-11-26 MED ORDER — ALUM & MAG HYDROXIDE-SIMETH 200-200-20 MG/5ML PO SUSP: 30.0000 mL | ORAL | Status: DC | PRN

## 2016-11-26 MED ORDER — 0.9 % SODIUM CHLORIDE (POUR BTL) OPTIME
TOPICAL | Status: DC | PRN
  Administered 2016-11-26: 1000 mL

## 2016-11-26 MED ORDER — ACETAMINOPHEN 10 MG/ML IV SOLN
1000.0000 mg | Freq: Once | INTRAVENOUS | Status: AC
  Administered 2016-11-26: 1000 mg via INTRAVENOUS

## 2016-11-26 MED ORDER — ALBUMIN HUMAN 5 % IV SOLN
INTRAVENOUS | Status: DC | PRN
  Administered 2016-11-26: 09:00:00 via INTRAVENOUS

## 2016-11-26 SURGICAL SUPPLY — 58 items
BAG DECANTER FOR FLEXI CONT (MISCELLANEOUS) ×3 IMPLANT
BLADE SAW SAG 73X25 THK (BLADE) ×2
BLADE SAW SGTL 73X25 THK (BLADE) ×1 IMPLANT
BRUSH FEMORAL CANAL (MISCELLANEOUS) IMPLANT
CAPT HIP TOTAL 2 ×3 IMPLANT
COVER SURGICAL LIGHT HANDLE (MISCELLANEOUS) ×3 IMPLANT
DRAPE INCISE IOBAN 66X45 STRL (DRAPES) IMPLANT
DRAPE ORTHO SPLIT 77X108 STRL (DRAPES) ×4
DRAPE SURG ORHT 6 SPLT 77X108 (DRAPES) ×2 IMPLANT
DRSG MEPILEX BORDER 4X12 (GAUZE/BANDAGES/DRESSINGS) ×3 IMPLANT
DURAPREP 26ML APPLICATOR (WOUND CARE) ×6 IMPLANT
ELECT BLADE 6.5 EXT (BLADE) IMPLANT
ELECT REM PT RETURN 9FT ADLT (ELECTROSURGICAL) ×3
ELECTRODE REM PT RTRN 9FT ADLT (ELECTROSURGICAL) ×1 IMPLANT
ELIMINATOR HOLE APEX DEPUY (Hips) ×3 IMPLANT
EVACUATOR 1/8 PVC DRAIN (DRAIN) IMPLANT
FACESHIELD WRAPAROUND (MASK) ×6 IMPLANT
GLOVE BIOGEL PI IND STRL 8 (GLOVE) ×2 IMPLANT
GLOVE BIOGEL PI IND STRL 8.5 (GLOVE) ×1 IMPLANT
GLOVE BIOGEL PI INDICATOR 8 (GLOVE) ×4
GLOVE BIOGEL PI INDICATOR 8.5 (GLOVE) ×2
GLOVE ECLIPSE 8.0 STRL XLNG CF (GLOVE) ×9 IMPLANT
GLOVE SURG ORTHO 8.5 STRL (GLOVE) ×6 IMPLANT
GOWN STRL REUS W/ TWL LRG LVL3 (GOWN DISPOSABLE) ×2 IMPLANT
GOWN STRL REUS W/TWL 2XL LVL3 (GOWN DISPOSABLE) ×6 IMPLANT
GOWN STRL REUS W/TWL LRG LVL3 (GOWN DISPOSABLE) ×4
HANDPIECE INTERPULSE COAX TIP (DISPOSABLE)
IMMOBILIZER KNEE 20 (SOFTGOODS) IMPLANT
IMMOBILIZER KNEE 22 UNIV (SOFTGOODS) ×3 IMPLANT
KIT BASIN OR (CUSTOM PROCEDURE TRAY) ×3 IMPLANT
KIT ROOM TURNOVER OR (KITS) ×3 IMPLANT
MANIFOLD NEPTUNE II (INSTRUMENTS) ×3 IMPLANT
NEEDLE 22X1 1/2 (OR ONLY) (NEEDLE) ×3 IMPLANT
NS IRRIG 1000ML POUR BTL (IV SOLUTION) ×3 IMPLANT
PACK TOTAL JOINT (CUSTOM PROCEDURE TRAY) ×3 IMPLANT
PACK UNIVERSAL I (CUSTOM PROCEDURE TRAY) ×3 IMPLANT
PAD ARMBOARD 7.5X6 YLW CONV (MISCELLANEOUS) ×6 IMPLANT
PRESSURIZER FEMORAL UNIV (MISCELLANEOUS) IMPLANT
SET HNDPC FAN SPRY TIP SCT (DISPOSABLE) IMPLANT
STAPLER VISISTAT 35W (STAPLE) IMPLANT
SUCTION FRAZIER HANDLE 10FR (MISCELLANEOUS) ×2
SUCTION TUBE FRAZIER 10FR DISP (MISCELLANEOUS) ×1 IMPLANT
SUT BONE WAX W31G (SUTURE) IMPLANT
SUT ETHIBOND NAB CT1 #1 30IN (SUTURE) ×9 IMPLANT
SUT MNCRL AB 3-0 PS2 18 (SUTURE) ×3 IMPLANT
SUT VIC AB 0 CT1 27 (SUTURE) ×4
SUT VIC AB 0 CT1 27XBRD ANBCTR (SUTURE) ×2 IMPLANT
SUT VIC AB 1 CT1 27 (SUTURE) ×4
SUT VIC AB 1 CT1 27XBRD ANBCTR (SUTURE) ×2 IMPLANT
SUT VIC AB 2-0 CT1 27 (SUTURE) ×2
SUT VIC AB 2-0 CT1 TAPERPNT 27 (SUTURE) ×1 IMPLANT
SYR CONTROL 10ML LL (SYRINGE) IMPLANT
TOWEL OR 17X24 6PK STRL BLUE (TOWEL DISPOSABLE) ×3 IMPLANT
TOWEL OR 17X26 10 PK STRL BLUE (TOWEL DISPOSABLE) ×6 IMPLANT
TOWER CARTRIDGE SMART MIX (DISPOSABLE) IMPLANT
TRAY CATH 16FR W/PLASTIC CATH (SET/KITS/TRAYS/PACK) ×3 IMPLANT
WATER STERILE IRR 1000ML POUR (IV SOLUTION) ×12 IMPLANT
WRAP KNEE MAXI GEL POST OP (GAUZE/BANDAGES/DRESSINGS) ×3 IMPLANT

## 2016-11-26 NOTE — Transfer of Care (Signed)
Immediate Anesthesia Transfer of Care Note  Patient: Claudia Lara  Procedure(s) Performed: Procedure(s): RIGHT TOTAL HIP ARTHROPLASTY (Right)  Patient Location: PACU  Anesthesia Type:MAC and Spinal  Level of Consciousness: awake, alert , oriented and patient cooperative  Airway & Oxygen Therapy: Patient Spontanous Breathing and Patient connected to face mask oxygen  Post-op Assessment: Report given to RN and Post -op Vital signs reviewed and stable  Post vital signs: Reviewed and stable  BP 123/54; spo2 99 hr 66 Last Vitals:  Vitals:   11/26/16 0612  BP: (!) 163/40  Pulse: 68  Resp: 20  Temp: 36.9 C    Last Pain:  Vitals:   11/26/16 0612  TempSrc: Oral      Patients Stated Pain Goal: 3 (28/97/91 5041)  Complications: No apparent anesthesia complications

## 2016-11-26 NOTE — Evaluation (Signed)
Physical Therapy Evaluation Patient Details Name: Claudia Lara MRN: [id number] DOB: [birth year] Today's Date: 11/26/2016   History of Present Illness  Pt is an 83 y.o. female now s/p Rt posterior THA. PMH: Rt TKA, HTN, PVD, dyspnea on exertion.   Clinical Impression  Pt is s/p Rt posterior THA resulting in the deficits listed below (see PT Problem List). Pt able to ambulate 6 ft with rw during initial PT session. Based upon the patient's current mobility, recommending SNF following her acute stay. Pt will benefit from skilled PT to increase their independence and safety with mobility.      Follow Up Recommendations SNF;Supervision for mobility/OOB    Equipment Recommendations   (to be addressed at next venue)    Recommendations for Other Services       Precautions / Restrictions Precautions Precautions: Posterior Hip;Fall Precaution Booklet Issued: Yes (comment) Precaution Comments: precautions reviewed and provided, HEP provided.  Required Braces or Orthoses: Knee Immobilizer - Right Knee Immobilizer - Right: On at all times Restrictions Weight Bearing Restrictions: Yes RLE Weight Bearing: Weight bearing as tolerated      Mobility  Bed Mobility Overal bed mobility: Needs Assistance Bed Mobility: Supine to Sit     Supine to sit: Mod assist     General bed mobility comments: HOB elevated, using rail, PT assisting at trunk and with Rt LE. Verbal and tactile cues for precautions  Transfers Overall transfer level: Needs assistance Equipment used: Rolling walker (2 wheeled) Transfers: Sit to/from Stand Sit to Stand: Mod assist         General transfer comment: cues for hand placement and precautions  Ambulation/Gait Ambulation/Gait assistance: Min guard Ambulation Distance (Feet): 6 Feet Assistive device: Rolling walker (2 wheeled) Gait Pattern/deviations: Step-to pattern;Decreased weight shift to right;Decreased stance time - right Gait velocity: decreased    General Gait Details: cues for weightbearing through Rt LE, verbal and physical assist for gait sequence.   Stairs            Wheelchair Mobility    Modified Rankin (Stroke Patients Only)       Balance Overall balance assessment: Needs assistance Sitting-balance support: Single extremity supported Sitting balance-Leahy Scale: Poor     Standing balance support: Bilateral upper extremity supported Standing balance-Leahy Scale: Poor Standing balance comment: using rw                             Pertinent Vitals/Pain Pain Assessment: 0-10 Pain Score: 6  Pain Location: Rt hip Pain Descriptors / Indicators: Operative site guarding;Aching Pain Intervention(s): Limited activity within patient's tolerance;Monitored during session;Ice applied    Home Living Family/patient expects to be discharged to:: Skilled nursing facility                 Additional Comments: Pt reports living alone    Prior Function Level of Independence: Independent               Hand Dominance        Extremity/Trunk Assessment   Upper Extremity Assessment Upper Extremity Assessment: Overall WFL for tasks assessed    Lower Extremity Assessment Lower Extremity Assessment: RLE deficits/detail RLE Deficits / Details: assist needed to move LE in bed.        Communication   Communication: No difficulties  Cognition Arousal/Alertness: Awake/alert Behavior During Therapy: WFL for tasks assessed/performed Overall Cognitive Status: Within Functional Limits for tasks assessed  General Comments      Exercises     Assessment/Plan    PT Assessment Patient needs continued PT services  PT Problem List Decreased strength;Decreased range of motion;Decreased activity tolerance;Decreased balance;Decreased mobility;Decreased knowledge of precautions          PT Treatment Interventions DME instruction;Gait training;Stair training;Functional  mobility training;Therapeutic activities;Therapeutic exercise;Patient/family education    PT Goals (Current goals can be found in the Care Plan section)  Acute Rehab PT Goals Patient Stated Goal: go for more rehab and eventually get back home PT Goal Formulation: With patient Time For Goal Achievement: 12/10/16 Potential to Achieve Goals: Good    Frequency 7X/week   Barriers to discharge        Co-evaluation               End of Session Equipment Utilized During Treatment: Gait belt;Right knee immobilizer Activity Tolerance: Patient tolerated treatment well (pt did become nauseated with emesis following) Patient left: in chair;with call bell/phone within reach Nurse Communication: Mobility status;Precautions;Weight bearing status         Time: [id number] PT Time Calculation (min) (ACUTE ONLY): 27 min   Charges:   PT Evaluation $PT Eval Moderate Complexity: 1 Procedure PT Treatments $Gait Training: 8-22 mins   PT G Codes:        Cassell Clement, PT, CSCS Pager [phone] Office [phone]  11/26/2016, 4:00 PM

## 2016-11-26 NOTE — Anesthesia Postprocedure Evaluation (Signed)
Anesthesia Post Note  Patient: Claudia Lara  Procedure(s) Performed: Procedure(s) (LRB): RIGHT TOTAL HIP ARTHROPLASTY (Right)  Patient location during evaluation: PACU Anesthesia Type: Spinal Level of consciousness: oriented and awake and alert Pain management: pain level controlled Vital Signs Assessment: post-procedure vital signs reviewed and stable Respiratory status: spontaneous breathing, respiratory function stable and patient connected to nasal cannula oxygen Cardiovascular status: blood pressure returned to baseline and stable Postop Assessment: no headache and no backache Anesthetic complications: no       Last Vitals:  Vitals:   11/26/16 0945 11/26/16 1000  BP: (!) 123/54 (!) 133/55  Pulse: 71 71  Resp: 17 16  Temp: 36.2 C     Last Pain:  Vitals:   11/26/16 0945  TempSrc:   PainSc: 0-No pain                 Meliah Appleman DAVID

## 2016-11-26 NOTE — H&P (Signed)
The recent History & Physical has been reviewed. I have personally examined the patient today. There is no interval change to the documented History & Physical. The patient would like to proceed with the procedure.  Garald Balding 11/26/2016,  7:11 AM

## 2016-11-26 NOTE — Op Note (Signed)
PATIENT ID:      Claudia Lara  MRN:     [id number] DOB/AGE:    1932-06-21 / 83 y.o.       OPERATIVE REPORT    DATE OF PROCEDURE:  11/26/2016       PREOPERATIVE DIAGNOSIS:END STAGE   RIGHT HIP OSTEOARTHTRITIS                                                       Estimated body mass index is 29.64 kg/m as calculated from the following:   Height as of 11/13/16: 5' 3.5" (1.613 m).   Weight as of 11/13/16: 170 lb (77.1 kg).     POSTOPERATIVE DIAGNOSIS:END STAGE   RIGHT HIP OSTEOARTHTRITIS                                                                     Estimated body mass index is 29.64 kg/m as calculated from the following:   Height as of 11/13/16: 5' 3.5" (1.613 m).   Weight as of 11/13/16: 170 lb (77.1 kg).     PROCEDURE:  Procedure(s): RIGHT TOTAL HIP ARTHROPLASTY     SURGEON:  Joni Fears, MD    ASSISTANT:   Biagio Borg, PA-C   (Present and scrubbed throughout the case, critical for assistance with exposure, retraction, instrumentation, and closure.)          ANESTHESIA: spinal and IV sedation     DRAINS: none :      TOURNIQUET TIME: * No tourniquets in log *    COMPLICATIONS:  None   CONDITION:  stable  PROCEDURE IN DETAIL: [place] 11/26/2016, 9:29 AM

## 2016-11-26 NOTE — Anesthesia Procedure Notes (Signed)
Spinal  Patient location during procedure: OR Start time: 11/26/2016 7:29 AM End time: 11/26/2016 7:32 AM Staffing Anesthesiologist: Lillia Abed Performed: anesthesiologist  Preanesthetic Checklist Completed: patient identified, surgical consent, pre-op evaluation, timeout performed, IV checked, risks and benefits discussed and monitors and equipment checked Spinal Block Patient position: sitting Prep: Betadine Patient monitoring: heart rate, cardiac monitor, continuous pulse ox and blood pressure Approach: right paramedian Location: L3-4 Injection technique: single-shot Needle Needle type: Pencan  Needle gauge: 24 G Needle length: 9 cm Needle insertion depth: 6 cm

## 2016-11-26 NOTE — Anesthesia Preprocedure Evaluation (Signed)
Anesthesia Evaluation  Patient identified by MRN, date of birth, ID band Patient awake    Reviewed: Allergy & Precautions, NPO status , Patient's Chart, lab work & pertinent test results  Airway Mallampati: I  TM Distance: >3 FB Neck ROM: Full    Dental   Pulmonary    Pulmonary exam normal        Cardiovascular hypertension, Normal cardiovascular exam     Neuro/Psych    GI/Hepatic GERD  Medicated and Controlled,  Endo/Other    Renal/GU Renal InsufficiencyRenal disease     Musculoskeletal   Abdominal   Peds  Hematology   Anesthesia Other Findings   Reproductive/Obstetrics                             Anesthesia Physical Anesthesia Plan  ASA: II  Anesthesia Plan: Spinal and MAC   Post-op Pain Management:    Induction: Intravenous  Airway Management Planned: Simple Face Mask  Additional Equipment:   Intra-op Plan:   Post-operative Plan:   Informed Consent: I have reviewed the patients History and Physical, chart, labs and discussed the procedure including the risks, benefits and alternatives for the proposed anesthesia with the patient or authorized representative who has indicated his/her understanding and acceptance.     Plan Discussed with: CRNA and Surgeon  Anesthesia Plan Comments:         Anesthesia Quick Evaluation

## 2016-11-27 ENCOUNTER — Encounter (HOSPITAL_COMMUNITY): Payer: Self-pay | Admitting: Orthopaedic Surgery

## 2016-11-27 LAB — BASIC METABOLIC PANEL
Anion gap: 6 (ref 5–15)
BUN: 20 mg/dL (ref 6–20)
CHLORIDE: 107 mmol/L (ref 101–111)
CO2: 25 mmol/L (ref 22–32)
CREATININE: 1.1 mg/dL — AB (ref 0.44–1.00)
Calcium: 8.3 mg/dL — ABNORMAL LOW (ref 8.9–10.3)
GFR calc Af Amer: 53 mL/min — ABNORMAL LOW (ref >60)
GFR calc non Af Amer: 45 mL/min — ABNORMAL LOW (ref >60)
Glucose, Bld: 128 mg/dL — ABNORMAL HIGH (ref 65–99)
POTASSIUM: 3.7 mmol/L (ref 3.5–5.1)
Sodium: 138 mmol/L (ref 135–145)

## 2016-11-27 LAB — CBC
HEMATOCRIT: 22 % — AB (ref 36.0–46.0)
HEMOGLOBIN: 7.6 g/dL — AB (ref 12.0–15.0)
MCH: 31 pg (ref 26.0–34.0)
MCHC: 34.5 g/dL (ref 30.0–36.0)
MCV: 89.8 fL (ref 78.0–100.0)
Platelets: 182 10*3/uL (ref 150–400)
RBC: 2.45 MIL/uL — AB (ref 3.87–5.11)
RDW: 19.1 % — ABNORMAL HIGH (ref 11.5–15.5)
WBC: 8.3 10*3/uL (ref 4.0–10.5)

## 2016-11-27 NOTE — Progress Notes (Signed)
Physical Therapy Treatment Patient Details Name: Claudia Lara MRN: [id number] DOB: 12/08/1931 Today's Date: 11/27/2016    History of Present Illness Pt is an 83 y.o. female now s/p Rt posterior THA. PMH: Rt TKA, HTN, PVD, dyspnea on exertion.     PT Comments    Pt making gradual progress with mobility, able to ambulate 30 ft with rw and min guard assistance. Reviewing posterior hip precautions and cues as needed during activity. PT continuing to recommend SNF for further rehabilitation following acute stay.   Follow Up Recommendations  SNF;Supervision for mobility/OOB     Equipment Recommendations  None recommended by PT    Recommendations for Other Services       Precautions / Restrictions Precautions Precautions: Posterior Hip;Fall Precaution Comments: pt recalling 2/3, reviewed Required Braces or Orthoses: Knee Immobilizer - Right Knee Immobilizer - Right: On at all times Restrictions Weight Bearing Restrictions: Yes RLE Weight Bearing: Weight bearing as tolerated    Mobility  Bed Mobility               General bed mobility comments: pt out of bed upon arrival  Transfers Overall transfer level: Needs assistance Equipment used: Rolling walker (2 wheeled) Transfers: Sit to/from Stand Sit to Stand: Min assist         General transfer comment: performed from [facility], mild instability with initial standing.   Ambulation/Gait Ambulation/Gait assistance: Min guard Ambulation Distance (Feet): 30 Feet Assistive device: Rolling walker (2 wheeled) Gait Pattern/deviations: Step-to pattern;Decreased stance time - right;Decreased weight shift to right Gait velocity: decreased   General Gait Details: cues for weightbearing through Rt LE   Stairs            Wheelchair Mobility    Modified Rankin (Stroke Patients Only)       Balance Overall balance assessment: Needs assistance Sitting-balance support: No upper extremity supported Sitting balance-Leahy  Scale: Fair     Standing balance support: Bilateral upper extremity supported;During functional activity;Single extremity supported Standing balance-Leahy Scale: Poor Standing balance comment: using rw                    Cognition Arousal/Alertness: Awake/alert Behavior During Therapy: WFL for tasks assessed/performed Overall Cognitive Status: Within Functional Limits for tasks assessed                      Exercises Total Joint Exercises Ankle Circles/Pumps: AROM;Both;20 reps Quad Sets: Strengthening;Right;10 reps Short Arc Quad: Strengthening;Right;10 reps Hip ABduction/ADduction: Strengthening;Right;10 reps (mod assist)    General Comments        Pertinent Vitals/Pain Pain Assessment: No/denies pain    Home Living                      Prior Function            PT Goals (current goals can now be found in the care plan section) Acute Rehab PT Goals Patient Stated Goal: go for more rehab and eventually get back home PT Goal Formulation: With patient Time For Goal Achievement: 12/10/16 Potential to Achieve Goals: Good Progress towards PT goals: Progressing toward goals    Frequency    7X/week      PT Plan Current plan remains appropriate    Co-evaluation             End of Session Equipment Utilized During Treatment: Gait belt;Right knee immobilizer Activity Tolerance: Patient tolerated treatment well Patient left: in chair;with call bell/phone within reach  Time: [id number] PT Time Calculation (min) (ACUTE ONLY): 30 min  Charges:  $Gait Training: 8-22 mins $Therapeutic Exercise: 8-22 mins                    G Codes:      Cassell Clement, PT, CSCS Pager [phone] Office [phone]  11/27/2016, 12:47 PM

## 2016-11-27 NOTE — Op Note (Signed)
NAME:  Claudia Lara, Claudia Lara                     ACCOUNT NO.:  MEDICAL RECORD NO.:  [id number]  LOCATION:                                 FACILITY:  PHYSICIAN:  Vonna Kotyk. Durward Fortes, M.D.    DATE OF BIRTH:  DATE OF PROCEDURE:  11/26/2016 DATE OF DISCHARGE:                              OPERATIVE REPORT   PREOPERATIVE DIAGNOSIS:  End-stage osteoarthritis, right hip.  POSTOPERATIVE DIAGNOSIS:  End-stage osteoarthritis, right hip.  PROCEDURE:  Right total hip replacement.  SURGEON:  Vonna Kotyk. Durward Fortes, M.D.  ASSISTANT:  Aaron Edelman D. Petrarca, PA-C., who was present throughout the operative procedure to ensure its timely completion.  ANESTHESIA:  Spinal with IV sedation.  COMPLICATIONS:  None.  COMPONENTS:  DePuy AML large stature 13.5 mm femoral component with a 36 mm outer diameter hip ball with a +5 neck length, a 52 mm outer diameter Gription 3 acetabular component with a single 25 mm 6.5 mm acetabular screw, marathon polyethylene liner.  COMPONENTS:  Press-fit.  DESCRIPTION OF PROCEDURE:  Claudia Lara was met in the holding area, identified the right hip was the appropriate operative site, and marked it accordingly.  Any questions were answered.  The patient was then transported to room #7.  Anesthesia performed spinal anesthesia.  With the patient under IV sedation, nursing staff inserted a Foley catheter. Urine was clear.  The patient was then placed in the lateral decubitus position with the right side up and secured to the operating room table with the Innomed hip system.  The time-out was called.  The right lower extremity was then prepped with chlorhexidine scrub and DuraPrep x2 from iliac crest to the ankle.  Sterile draping was performed.  We called a second time-out.  A routine Southern incision was utilized and via sharp dissection carried down to the subcutaneous tissue.  Gross bleeders were Bovie coagulated.  Adipose tissue was incised to the level of the gluteal muscles.   Fascia was incised and muscle fibers were then separated manually.  Self-retaining retractors were inserted.  With the hip internally rotated, the short external rotators were identified. They were then tagged and released from their attachment of the posterior aspect of the greater trochanter.  The capsule was identified, incised along the femoral neck and head.  Joint was entered.  There was a small clear yellow joint effusion.  The head was then easily dislocated posteriorly.  The head was osteotomized using an angle guide with the calcar guide.  The starter hole was then made in the piriformis fossa and the canal finder was then inserted.  Reaming was performed to 11.5 mm to accept a 12 component. We then rasped to a 12 mm large stature femoral component, felt like we still had room for a larger component and it would toggle, so then we reamed to 12.5 to 13 mm and then re-reamed and re-rasped to a large stature 13.5 component.  This was perfectly stable.  Calcar cutter was used to obtain the appropriate angle on the calcar.  Calcar cut was about a fingerbreadth proximal to the lesser trochanter.  Retractors were then placed around the acetabulum.  The labrum was sharply  excised.  Reaming was performed sequentially to 51 mm to accept a 52 mm component. We trialed a 50 mm component, which would completely seat, 52 mm had good rim fit, but would not completely seat.  Accordingly, the 52 mm outer diameter Gription 3 acetabular component was then impacted using the external acetabular guide.  I then inserted a single 6.5 mm acetabular screw after appropriate reaming, measured 25 mm in length.  The trialed acetabular polyethylene component was then inserted followed by the 13.5 mm femoral rasp.  We trialed a 36 mm outer diameter hip ball with a +1.5 and a 5 mm neck length.  The 5 mm neck length reestablished leg lengths and it was perfectly stable in all range of motion.  Trial  components were removed.  The joint was irrigated with saline solution.  We then placed topical tranexamic acid into the acetabulum. The final femoral acetabular polyethylene component was then impacted.  The 13.5 mm femoral component was then impacted onto the calcar.  We cleaned the [facility] taper neck and inserted the 36 mm outer diameter hip ball with a 5 mm neck length.  The acetabulum was cleaned, irrigated. The entire construct was reduced.  There was no toggling.  Leg lengths appeared to be symmetrical.  There was no instability in any range of motion.  The wound was again irrigated with saline solution.  The deep capsule was closed with #1 Ethibond.  Short external rotator was closed with the similar material.  I did check the sciatic nerve throughout the procedure and felt like it was perfectly intact and well out of harm's way.  The iliotibial band was then closed with a running #1 Vicryl. Tranexamic acid was placed at this level and then we closed the final fascia and adipose in 2 layers with 2-0 Vicryl and 3-0 Monocryl.  Skin closed with skin clips.  Sterile bulky dressing was applied.  The patient was then placed supine on the operating room table and then transferred to the postanesthesia recovery room gurney in a stable condition.  We did apply a knee immobilizer to the right lower extremity.  The patient tolerated the procedure without complications.     Vonna Kotyk. Durward Fortes, M.D.     PWW/MEDQ  D:  11/26/2016  T:  11/26/2016  Job:  [id number]

## 2016-11-27 NOTE — Op Note (Signed)
PATIENT ID: Claudia Lara        MRN:  [id number]          DOB/AGE: 05-16-1932 / 83 y.o.    Joni Fears, MD   Biagio Borg, PA-C [address] [place], [place]  [address]                             [phone]   PROGRESS NOTE  Subjective:  negative for Chest Pain  negative for Shortness of Breath  positive for Nausea/Vomiting last night-OK this am  negative for Calf Pain    Tolerating Diet: yes         Patient reports pain as mild.     No related problems this am  Objective: Vital signs in last 24 hours:   Patient Vitals for the past 24 hrs:  BP Temp Temp src Pulse Resp SpO2  11/27/16 0703 (!) 144/46 99 F (37.2 C) Oral 88 16 97 %  11/27/16 0118 (!) 137/41 99.1 F (37.3 C) Oral 81 16 98 %  11/26/16 2147 (!) 148/53 99 F (37.2 C) Oral 84 16 97 %  11/26/16 1700 (!) 157/40 97.3 F (36.3 C) Oral 68 16 96 %  11/26/16 1500 (!) 159/44 97.4 F (36.3 C) Oral 67 16 94 %  11/26/16 1242 (!) 175/55 97.4 F (36.3 C) Oral 67 16 100 %  11/26/16 1240 (!) 181/58 - - - - -  11/26/16 1215 - 97.5 F (36.4 C) - - - -  11/26/16 1000 (!) 133/55 - - 71 16 98 %  11/26/16 0945 (!) 123/54 97.2 F (36.2 C) - 71 17 100 %      Intake/Output from previous day:   01/02 0701 - 01/03 0700 In: 2575 [P.O.:300; I.V.:1300] Out: 1675 [Urine:1275]   Intake/Output this shift:   No intake/output data recorded.   Intake/Output      01/02 0701 - 01/03 0700 01/03 0701 - 01/04 0700   P.O. 300    I.V. 1300    Other 450    IV Piggyback 525    Total Intake 2575     Urine 1275    Blood 400    Total Output 1675     Net +900             LABORATORY DATA:  Recent Labs  11/26/16 1320  WBC 9.5  HGB 8.2*  HCT 24.7*  PLT 178   No results for input(s): NA, K, CL, CO2, BUN, CREATININE, GLUCOSE, CALCIUM in the last 168 hours. Lab Results  Component Value Date   INR 1.25 11/13/2016   INR 1.3 (H) 11/09/2015   INR 1.17 07/05/2015    Recent Radiographic Studies :  Dg Chest 2  View  Result Date: 11/13/2016 CLINICAL DATA:  Preoperative examination prior right total hip arthroplasty. No current chest complaints. History of hypertension, mild aortic stenosis with left ventricular diagnostic dysfunction, nonsmoker. EXAM: CHEST  2 VIEW COMPARISON:  Chest x-ray of October 19, 2010 FINDINGS: The lungs are well-expanded and clear. The heart and pulmonary vascularity are normal. The mediastinum is normal in width. There is dense calcification in the wall of the thoracic aorta. There is no pleural effusion. There is mild multilevel degenerative disc disease of the thoracic spine. IMPRESSION: Chronic bronchitic changes. No pneumonia nor other acute cardiopulmonary abnormality. Thoracic aortic atherosclerosis. Electronically Signed   By: David  Martinique M.D.   On: 11/13/2016 15:21   Dg Hip  Port Unilat With Pelvis 1v Right  Result Date: 11/26/2016 CLINICAL DATA:  Postop right hip replacement EXAM: DG HIP (WITH OR WITHOUT PELVIS) 1V PORT RIGHT COMPARISON:  None. FINDINGS: Right total hip arthroplasty without failure or complication. No acute fracture or dislocation. Mild osteoarthritis of the left hip. IMPRESSION: Right total hip arthroplasty. Electronically Signed   By: Kathreen Devoid   On: 11/26/2016 10:28     Examination:  General appearance: alert, cooperative and no distress  Wound Exam: clean, dry, intact   Drainage:  None: wound tissue dry  Motor Exam: EHL, FHL, Anterior Tibial and Posterior Tibial Intact  Sensory Exam: Superficial Peroneal, Deep Peroneal and Tibial normal  Vascular Exam: Normal  Assessment:    1 Day Post-Op  Procedure(s) (LRB): RIGHT TOTAL HIP ARTHROPLASTY (Right)  ADDITIONAL DIAGNOSIS:  Principal Problem:   Unilateral primary osteoarthritis, right hip Active Problems:   S/P total hip arthroplasty  Acute Blood Loss Anemia-asymptomatic   Plan: Physical Therapy as ordered Weight Bearing as Tolerated (WBAT)  DVT Prophylaxis:  Xarelto, Foot  Pumps and TED hose  DISCHARGE PLAN: Skilled Nursing Facility/Rehab-[facility]  DISCHARGE NEEDS: HHPT, Walker and 3-in-1 comode seat OOB with PT, monitor H&H, catheter D/C'd, rehab ? Tomorrow, saline lock IV       Garald Balding  11/27/2016 7:50 AM

## 2016-11-27 NOTE — Clinical Social Work Placement (Signed)
   CLINICAL SOCIAL WORK PLACEMENT  NOTE  Date:  11/27/2016  Patient Details  Name: Claudia Lara MRN: [id number] Date of Birth: 25-May-1932  Clinical Social Work is seeking post-discharge placement for this patient at the [place] level of care (*CSW will initial, date and re-position this form in  chart as items are completed):      Patient/family provided with [place] Work Department's list of facilities offering this level of care within the geographic area requested by the patient (or if unable, by the patient's family).  Yes   Patient/family informed of their freedom to choose among providers that offer the needed level of care, that participate in Medicare, Medicaid or managed care program needed by the patient, have an available bed and are willing to accept the patient.      Patient/family informed of [facility]'s ownership interest in [facility] and [facility], as well as of the fact that they are under no obligation to receive care at these facilities.  PASRR submitted to EDS on       PASRR number received on 11/27/16     Existing PASRR number confirmed on       FL2 transmitted to all facilities in geographic area requested by pt/family on 11/27/16     FL2 transmitted to all facilities within larger geographic area on       Patient informed that his/her managed care company has contracts with or will negotiate with certain facilities, including the following:        Yes   Patient/family informed of bed offers received.  Patient chooses bed at [facility]     Physician recommends and patient chooses bed at      Patient to be transferred to [facility] on 11/28/16.  Patient to be transferred to facility by [facility]     Patient family notified on 11/28/16 of transfer.  Name of family member notified:        PHYSICIAN Please prepare priority discharge summary, including medications, Please prepare prescriptions      Additional Comment:    _______________________________________________ Alla German, LCSW 11/27/2016, 2:37 PM

## 2016-11-27 NOTE — Clinical Social Work Note (Signed)
Clinical Social Work Assessment  Patient Details  Name: Claudia Lara MRN: [id number] Date of Birth: [birth year]  Date of referral:  11/27/16               Reason for consult:  Facility Placement                Permission sought to share information with:  Family Supports Permission granted to share information::  Yes, Verbal Permission Granted  Name::     Ivin Booty  Agency::     Relationship::  Daughter  Contact Information:  [phone]  Housing/Transportation Living arrangements for the past 2 months:  [place] of Information:  Patient Patient Interpreter Needed:  None Criminal Activity/Legal Involvement Pertinent to Current Situation/Hospitalization:  No - Comment as needed Significant Relationships:  Adult Children Lives with:  Self Do you feel safe going back to the place where you live?  Yes Need for family participation in patient care:  No (Coment)  Care giving concerns:  No family or friends at bedside during initial assessment.   Social Worker assessment / plan:  CSW spoke with pt at bedside to complete initial assessment. Pt lives at home alone. Pt is very independent. Pt reports she mows her own lawn and does all of her house and yard work on her own. Pt is agreeable to SNF placement at d/c. Pt has made pre arrangements to go to [facility] for rehab following d/c. Pt is probable d/c tomorrow. CSW contacted Ivin Booty at [facility] and she will look at bed availability for tomorrow. Pt is going by [facility].   Employment status:  Retired Nurse, adult PT Recommendations:  [place] / Referral to community resources:  [place]  Patient/Family's Response to care:  Pt verbalized understanding of CSW role and appreciation of support.   Patient/Family's Understanding of and Emotional Response to Diagnosis, Current Treatment, and Prognosis:  Pt is understanding and realistic regarding physical  limitations. Pt is agreeable to SNF placement at this time and has made prior arrangements to go to [facility] for rehab after d.c. Pt denies any concern regarding treatment plan at this time. CSW will continue to provide support to pt.   Emotional Assessment Appearance:  Appears stated age Attitude/Demeanor/Rapport:   (Patient was appropriate. ) Affect (typically observed):  Accepting, Appropriate, Calm, Pleasant Orientation:  Oriented to Self, Oriented to Place, Oriented to  Time, Oriented to Situation Alcohol / Substance use:  Not Applicable Psych involvement (Current and /or in the community):  No (Comment)  Discharge Needs  Concerns to be addressed:  No discharge needs identified Readmission within the last 30 days:  No Current discharge risk:  Dependent with Mobility Barriers to Discharge:  Continued Medical Work up   [employer], LCSW 11/27/2016, 2:29 PM

## 2016-11-27 NOTE — NC FL2 (Signed)
[place] LEVEL OF CARE SCREENING TOOL     IDENTIFICATION  Patient Name: Claudia Lara Birthdate: [birth year] Sex: female Admission Date (Current Location): 11/26/2016  [facility] and [place] Number:  Herbalist and Address:  [facility] [place]. [facility] [place] [address] [place], [place] [address]      Provider Number: [id number]  Attending Physician Name and Address:  Garald Balding, MD  Relative Name and Phone Number:       Current Level of Care: Hospital Recommended Level of Care: [place] Prior Approval Number:    Date Approved/Denied: 10/24/10 PASRR Number:  [id number]   Discharge Plan: SNF    Current Diagnoses: Patient Active Problem List   Diagnosis Date Noted  . Unilateral primary osteoarthritis, right hip 11/26/2016  . S/P total hip arthroplasty 11/26/2016  . GERD (gastroesophageal reflux disease) 08/03/2015  . Hemolytic anemia ([place]) 07/12/2015  . Chronic venous insufficiency 08/10/2014  . Aortic stenosis, mild 06/29/2014  . Left ventricular diastolic dysfunction, NYHA class 1 06/29/2014  . Senile osteopenia 04/21/2014  . Hyperlipidemia with target LDL less than 130 04/21/2014  . Kidney disease, chronic, stage III (GFR 30-59 ml/min) 04/21/2014  . Routine health maintenance 07/16/2012  . Carotid artery stenosis without cerebral infarction 01/16/2010  . GEN OSTEOARTHROSIS INVOLVING MULTIPLE SITES 11/11/2008  . Essential hypertension 10/19/2007    Orientation RESPIRATION BLADDER Height & Weight     Self, Time, Situation, Place  Normal Continent Weight:   Height:     BEHAVIORAL SYMPTOMS/MOOD NEUROLOGICAL BOWEL NUTRITION STATUS      Continent  (Please see discharge summary)  AMBULATORY STATUS COMMUNICATION OF NEEDS Skin   Limited Assist Verbally Surgical wounds (Closed incision right hip, adhesive bandage)                       Personal Care Assistance Level of Assistance  Bathing, Feeding, Dressing  Bathing Assistance: Limited assistance Feeding assistance: Independent Dressing Assistance: Limited assistance     Functional Limitations Info  Sight, Hearing, Speech Sight Info: Adequate Hearing Info: Adequate Speech Info: Adequate    SPECIAL CARE FACTORS FREQUENCY  PT (By licensed PT), OT (By licensed OT)     PT Frequency: 7x week OT Frequency: 7x week            Contractures Contractures Info: Not present    Additional Factors Info  Code Status, Allergies Code Status Info: Full Allergies Info: Sulfonamide Derivatives           Current Medications (11/27/2016):  This is the current hospital active medication list Current Facility-Administered Medications  Medication Dose Route Frequency Provider Last Rate Last Dose  . acetaminophen (OFIRMEV) IV 1,000 mg  1,000 mg Intravenous Q6H Mike Craze Petrarca, PA-C   1,000 mg at 11/27/16 0526  . alum & mag hydroxide-simeth (MAALOX/MYLANTA) 200-200-20 MG/5ML suspension 30 mL  30 mL Oral Q4H PRN Mike Craze Petrarca, PA-C      . bisacodyl (DULCOLAX) suppository 10 mg  10 mg Rectal Daily PRN Cherylann Ratel, PA-C      . calcium-vitamin D (OSCAL WITH D) 500-200 MG-UNIT per tablet 1 tablet  1 tablet Oral QPM Cherylann Ratel, PA-C   1 tablet at 11/26/16 1728  . carvedilol (COREG) tablet 12.5 mg  12.5 mg Oral BID WC Mike Craze Petrarca, PA-C   12.5 mg at 11/26/16 1728  . cholecalciferol (VITAMIN D) tablet 1,000 Units  1,000 Units Oral q morning - 10a Aaron Edelman  D Petrarca, PA-C      . diphenhydrAMINE (BENADRYL) 12.5 MG/5ML elixir 12.5-25 mg  12.5-25 mg Oral Q4H PRN Mike Craze Petrarca, PA-C      . docusate sodium (COLACE) capsule 100 mg  100 mg Oral BID Cherylann Ratel, PA-C   100 mg at 11/26/16 2044  . HYDROmorphone (DILAUDID) injection 0.5-1 mg  0.5-1 mg Intravenous Q2H PRN Mike Craze Petrarca, PA-C      . magnesium citrate solution 1 Bottle  1 Bottle Oral Once PRN Cherylann Ratel, PA-C      . menthol-cetylpyridinium (CEPACOL) lozenge 3 mg  1 lozenge  Oral PRN Cherylann Ratel, PA-C       Or  . phenol (CHLORASEPTIC) mouth spray 1 spray  1 spray Mouth/Throat PRN Cherylann Ratel, PA-C      . methocarbamol (ROBAXIN) tablet 500 mg  500 mg Oral Q6H PRN Cherylann Ratel, PA-C       Or  . methocarbamol (ROBAXIN) 500 mg in dextrose 5 % 50 mL IVPB  500 mg Intravenous Q6H PRN Cherylann Ratel, PA-C      . metoCLOPramide (REGLAN) tablet 5-10 mg  5-10 mg Oral Q8H PRN Cherylann Ratel, PA-C       Or  . metoCLOPramide (REGLAN) injection 5-10 mg  5-10 mg Intravenous Q8H PRN Cherylann Ratel, PA-C      . multivitamin with minerals tablet 1 tablet  1 tablet Oral Daily Brian D Petrarca, PA-C      . ondansetron (ZOFRAN) tablet 4 mg  4 mg Oral Q6H PRN Cherylann Ratel, PA-C       Or  . ondansetron (ZOFRAN) injection 4 mg  4 mg Intravenous Q6H PRN Cherylann Ratel, PA-C      . oxyCODONE (Oxy IR/ROXICODONE) immediate release tablet 5-10 mg  5-10 mg Oral Q3H PRN Cherylann Ratel, PA-C   10 mg at 11/26/16 1347  . pantoprazole (PROTONIX) EC tablet 40 mg  40 mg Oral Daily Brian D Petrarca, PA-C      . polyethylene glycol (MIRALAX / GLYCOLAX) packet 17 g  17 g Oral Daily PRN Cherylann Ratel, PA-C      . rivaroxaban (XARELTO) tablet 10 mg  10 mg Oral Q breakfast Brian D Petrarca, PA-C      . vitamin C (ASCORBIC ACID) tablet 500 mg  500 mg Oral Daily Cherylann Ratel, PA-C         Discharge Medications: Please see discharge summary for a list of discharge medications.  Relevant Imaging Results:  Relevant Lab Results:   Additional Information SSN: [id number]  Ht: 5' 3.5" (1.613 m) Wt: 170 lb (77.1 kg)  Orville Widmann A [place], LCSW

## 2016-11-27 NOTE — Evaluation (Signed)
Occupational Therapy Evaluation Patient Details Name: Claudia Lara MRN: [id number] DOB: 03-30-1932 Today's Date: 11/27/2016    History of Present Illness Pt is an 83 y.o. female now s/p Rt posterior THA. PMH: Rt TKA, HTN, PVD, dyspnea on exertion.    Clinical Impression   PTA, pt was independent with ADL and functional mobility. Pt currently requires max assist for LB ADL, min assist for UB ADL, and mod assist for functional mobility. Pt lives alone and will not have 24 hour assistance available at home. Pt would be a good candidate for short term SNF placement in order to maximize return to PLOF and improve independence and safety with ADL. OT will continue to follow acutely.    Follow Up Recommendations  SNF;Supervision/Assistance - 24 hour    Equipment Recommendations  Other (comment) (TBD at next venue of care)    Recommendations for Other Services       Precautions / Restrictions Precautions Precautions: Posterior Hip;Fall Precaution Booklet Issued: Yes (comment) Precaution Comments: Reviewed previously provided handout. Pt able to recall 2/3 precautions. Required Braces or Orthoses: Knee Immobilizer - Right Knee Immobilizer - Right: On at all times Restrictions Weight Bearing Restrictions: Yes RLE Weight Bearing: Weight bearing as tolerated      Mobility Bed Mobility Overal bed mobility: Needs Assistance Bed Mobility: Supine to Sit     Supine to sit: Min assist     General bed mobility comments: HOB elevated, OT assisiting with R LE and to scoot to EOB.  Transfers Overall transfer level: Needs assistance Equipment used: Rolling walker (2 wheeled) Transfers: Sit to/from Stand Sit to Stand: Min assist         General transfer comment: cues for hand placement and precautions    Balance Overall balance assessment: Needs assistance Sitting-balance support: Feet supported;Single extremity supported Sitting balance-Leahy Scale: Fair     Standing balance  support: Bilateral upper extremity supported;During functional activity;Single extremity supported Standing balance-Leahy Scale: Poor Standing balance comment: Able to stand with single UE support to assist with pericare.                            ADL Overall ADL's : Needs assistance/impaired     Grooming: Sitting;Min guard Grooming Details (indicate cue type and reason): Min guard for safety Upper Body Bathing: Minimal assistance;Sitting   Lower Body Bathing: Maximal assistance;Sit to/from stand   Upper Body Dressing : Minimal assistance;Sitting   Lower Body Dressing: Maximal assistance;Sit to/from stand   Toilet Transfer: Moderate assistance;RW;Ambulation;BSC   Toileting- Clothing Manipulation and Hygiene: Moderate assistance;Sit to/from stand       Functional mobility during ADLs: Moderate assistance;Rolling walker General ADL Comments: Pt requiring assist with UB ADL as she needs single UE support to maintain sitting balance without back support.     Vision Vision Assessment?: No apparent visual deficits   Perception     Praxis      Pertinent Vitals/Pain Pain Assessment: 0-10 Pain Score: 0-No pain Pain Location: Rt hip     Hand Dominance Right   Extremity/Trunk Assessment Upper Extremity Assessment Upper Extremity Assessment: Overall WFL for tasks assessed   Lower Extremity Assessment Lower Extremity Assessment: Defer to PT evaluation       Communication Communication Communication: No difficulties   Cognition Arousal/Alertness: Awake/alert Behavior During Therapy: WFL for tasks assessed/performed Overall Cognitive Status: Within Functional Limits for tasks assessed  General Comments       Exercises       Shoulder Instructions      Home Living Family/patient expects to be discharged to:: Skilled nursing facility                                 Additional Comments: Lives alone. Has cane,  RW, BSC, handheld shower head, and tub shower.      Prior Functioning/Environment Level of Independence: Independent        Comments: Independent but unable to put on R sock.        OT Problem List: Decreased strength;Decreased range of motion;Decreased activity tolerance;Impaired balance (sitting and/or standing);Decreased safety awareness;Decreased knowledge of use of DME or AE;Decreased knowledge of precautions;Pain   OT Treatment/Interventions: Self-care/ADL training;Therapeutic exercise;Energy conservation;Therapeutic activities;Patient/family education;Balance training    OT Goals(Current goals can be found in the care plan section) Acute Rehab OT Goals Patient Stated Goal: go for more rehab and eventually get back home OT Goal Formulation: With patient Time For Goal Achievement: 12/04/16 Potential to Achieve Goals: Good ADL Goals Pt Will Perform Lower Body Bathing: with min assist;with adaptive equipment;sit to/from stand Pt Will Perform Lower Body Dressing: with min assist;with adaptive equipment;sit to/from stand Pt Will Transfer to Toilet: with supervision;ambulating;bedside commode Pt Will Perform Toileting - Clothing Manipulation and hygiene: with min guard assist;sit to/from stand  OT Frequency: Min 1X/week   Barriers to D/C:            Co-evaluation              End of Session Equipment Utilized During Treatment: Gait belt;Rolling walker Nurse Communication: Mobility status  Activity Tolerance: Patient tolerated treatment well Patient left: in chair;with call bell/phone within reach   Time: [id number] OT Time Calculation (min): 39 min Charges:  OT General Charges $OT Visit: 1 Procedure OT Evaluation $OT Eval Moderate Complexity: 1 Procedure OT Treatments $Self Care/Home Management : 23-37 mins  Norman Herrlich, OTR/L [id number] 11/27/2016, 9:50 AM

## 2016-11-28 ENCOUNTER — Telehealth (INDEPENDENT_AMBULATORY_CARE_PROVIDER_SITE_OTHER): Payer: Self-pay | Admitting: Orthopaedic Surgery

## 2016-11-28 DIAGNOSIS — M25551 Pain in right hip: Secondary | ICD-10-CM | POA: Diagnosis not present

## 2016-11-28 DIAGNOSIS — M25559 Pain in unspecified hip: Secondary | ICD-10-CM | POA: Diagnosis not present

## 2016-11-28 DIAGNOSIS — R262 Difficulty in walking, not elsewhere classified: Secondary | ICD-10-CM | POA: Diagnosis not present

## 2016-11-28 DIAGNOSIS — M6281 Muscle weakness (generalized): Secondary | ICD-10-CM | POA: Diagnosis not present

## 2016-11-28 DIAGNOSIS — M1611 Unilateral primary osteoarthritis, right hip: Secondary | ICD-10-CM | POA: Diagnosis not present

## 2016-11-28 DIAGNOSIS — R7889 Finding of other specified substances, not normally found in blood: Secondary | ICD-10-CM | POA: Diagnosis not present

## 2016-11-28 DIAGNOSIS — R791 Abnormal coagulation profile: Secondary | ICD-10-CM | POA: Diagnosis not present

## 2016-11-28 DIAGNOSIS — M1990 Unspecified osteoarthritis, unspecified site: Secondary | ICD-10-CM | POA: Diagnosis not present

## 2016-11-28 DIAGNOSIS — R404 Transient alteration of awareness: Secondary | ICD-10-CM | POA: Diagnosis not present

## 2016-11-28 DIAGNOSIS — K5900 Constipation, unspecified: Secondary | ICD-10-CM | POA: Diagnosis not present

## 2016-11-28 DIAGNOSIS — Z96641 Presence of right artificial hip joint: Secondary | ICD-10-CM | POA: Diagnosis not present

## 2016-11-28 DIAGNOSIS — D62 Acute posthemorrhagic anemia: Secondary | ICD-10-CM | POA: Diagnosis not present

## 2016-11-28 DIAGNOSIS — D72828 Other elevated white blood cell count: Secondary | ICD-10-CM | POA: Diagnosis not present

## 2016-11-28 DIAGNOSIS — I10 Essential (primary) hypertension: Secondary | ICD-10-CM | POA: Diagnosis not present

## 2016-11-28 DIAGNOSIS — Z96651 Presence of right artificial knee joint: Secondary | ICD-10-CM | POA: Diagnosis not present

## 2016-11-28 DIAGNOSIS — D649 Anemia, unspecified: Secondary | ICD-10-CM | POA: Diagnosis not present

## 2016-11-28 DIAGNOSIS — N183 Chronic kidney disease, stage 3 (moderate): Secondary | ICD-10-CM | POA: Diagnosis not present

## 2016-11-28 DIAGNOSIS — R2681 Unsteadiness on feet: Secondary | ICD-10-CM | POA: Diagnosis not present

## 2016-11-28 DIAGNOSIS — Z471 Aftercare following joint replacement surgery: Secondary | ICD-10-CM | POA: Diagnosis not present

## 2016-11-28 DIAGNOSIS — R531 Weakness: Secondary | ICD-10-CM | POA: Diagnosis not present

## 2016-11-28 DIAGNOSIS — K219 Gastro-esophageal reflux disease without esophagitis: Secondary | ICD-10-CM | POA: Diagnosis not present

## 2016-11-28 DIAGNOSIS — B029 Zoster without complications: Secondary | ICD-10-CM | POA: Diagnosis not present

## 2016-11-28 LAB — BASIC METABOLIC PANEL
Anion gap: 6 (ref 5–15)
BUN: 27 mg/dL — AB (ref 6–20)
CO2: 24 mmol/L (ref 22–32)
CREATININE: 1.34 mg/dL — AB (ref 0.44–1.00)
Calcium: 8.2 mg/dL — ABNORMAL LOW (ref 8.9–10.3)
Chloride: 108 mmol/L (ref 101–111)
GFR calc Af Amer: 42 mL/min — ABNORMAL LOW (ref >60)
GFR calc non Af Amer: 36 mL/min — ABNORMAL LOW (ref >60)
Glucose, Bld: 144 mg/dL — ABNORMAL HIGH (ref 65–99)
Potassium: 3.9 mmol/L (ref 3.5–5.1)
SODIUM: 138 mmol/L (ref 135–145)

## 2016-11-28 LAB — CBC
HCT: 20.9 % — ABNORMAL LOW (ref 36.0–46.0)
HEMOGLOBIN: 7.1 g/dL — AB (ref 12.0–15.0)
MCH: 30.9 pg (ref 26.0–34.0)
MCHC: 34 g/dL (ref 30.0–36.0)
MCV: 90.9 fL (ref 78.0–100.0)
Platelets: 212 10*3/uL (ref 150–400)
RBC: 2.3 MIL/uL — ABNORMAL LOW (ref 3.87–5.11)
RDW: 19.6 % — ABNORMAL HIGH (ref 11.5–15.5)
WBC: 12.5 10*3/uL — ABNORMAL HIGH (ref 4.0–10.5)

## 2016-11-28 MED ORDER — METHOCARBAMOL 500 MG PO TABS: 500.0000 mg | ORAL_TABLET | Freq: Four times a day (QID) | ORAL | 0 refills | Status: DC | PRN

## 2016-11-28 MED ORDER — RIVAROXABAN 10 MG PO TABS: 10.0000 mg | ORAL_TABLET | Freq: Every day | ORAL | 0 refills | Status: DC

## 2016-11-28 MED ORDER — OXYCODONE HCL 5 MG PO TABS: 5.0000 mg | ORAL_TABLET | ORAL | 0 refills | Status: DC | PRN

## 2016-11-28 NOTE — Discharge Summary (Signed)
Joni Fears, MD   Biagio Borg, PA-C [name] [place], [place]  [address]                             [phone]  PATIENT ID: KENYATTE PIERCEFIELD        MRN:  [id number]          DOB/AGE: August 19, 1932 / 83 y.o.    DISCHARGE SUMMARY  ADMISSION DATE:    11/26/2016 DISCHARGE DATE:   11/28/2016   ADMISSION DIAGNOSIS: RIGHT HIP OSTEOARTHTRITIS    DISCHARGE DIAGNOSIS:  RIGHT HIP OSTEOARTHTRITIS    ADDITIONAL DIAGNOSIS: Principal Problem:   Unilateral primary osteoarthritis, right hip Active Problems:   S/P total hip arthroplasty  Past Medical History:  Diagnosis Date  . ANEMIA 08/22/2009  . CAROTID ARTERY STENOSIS 01/16/2010  . Complication of anesthesia    very hard to wake up from surgery  . DYSPNEA ON EXERTION 11/16/2007  . Gallstones   . GEN OSTEOARTHROSIS INVOLVING MULTIPLE SITES 11/11/2008  . GERD (gastroesophageal reflux disease)   . HEMORRHOIDS, INTERNAL    surgery was in the 90's (per patient)  . NECK PAIN, ACUTE 12/28/2009  . PERIPHERAL EDEMA 10/06/2007  . PVD 10/06/2007  . Unspecified essential hypertension 10/19/2007    PROCEDURE: Procedure(s): RIGHT TOTAL HIP ARTHROPLASTY  on 11/26/2016  CONSULTS: none    HISTORY:  See H&P in chart  HOSPITAL COURSE:  LAIKLYN CHESMORE is a 83 y.o. admitted on 11/26/2016 and found to have a diagnosis of [place].  After appropriate laboratory studies were obtained  they were taken to the operating room on 11/26/2016 and underwent  Procedure(s): RIGHT TOTAL HIP ARTHROPLASTY  Right.   They were given perioperative antibiotics:  Anti-infectives    Start     Dose/Rate Route Frequency Ordered Stop   11/26/16 1300  ceFAZolin (ANCEF) IVPB 2g/100 mL premix     2 g 200 mL/hr over 30 Minutes Intravenous Every 6 hours 11/26/16 1253 11/26/16 2113   11/26/16 0700  ceFAZolin (ANCEF) IVPB 2g/100 mL premix     2 g 200 mL/hr over 30 Minutes Intravenous On call to O.R. 11/25/16 [id number] 11/26/16 0746    .  Tolerated the  procedure well.  Placed with a foley intraoperatively.     Toradol was given post op.  POD #1, allowed out of bed to a chair.  PT for ambulation and exercise program.  Foley D/C'd in morning.  IV saline locked.  O2 discontionued.  POD #2, continued PT and ambulation..   The remainder of the hospital course was dedicated to ambulation and strengthening.   The patient was discharged on 2 Days Post-Op in  Stable condition.  Blood products given:none  DIAGNOSTIC STUDIES: Recent vital signs: Patient Vitals for the past 24 hrs:  BP Temp Temp src Pulse Resp SpO2  11/28/16 0438 (!) 108/51 100.2 F (37.9 C) Oral 99 16 95 %  11/27/16 2032 (!) 140/47 100.2 F (37.9 C) Oral 93 - 97 %  11/27/16 1600 102/73 - - 86 - -  11/27/16 1300 (!) 113/43 98.6 F (37 C) Oral (!) 114 16 97 %       Recent laboratory studies:  Recent Labs  11/26/16 1320 11/27/16 0648 11/28/16 0528  WBC 9.5 8.3 12.5*  HGB 8.2* 7.6* 7.1*  HCT 24.7* 22.0* 20.9*  PLT 178 182 212    Recent Labs  11/27/16 0648 11/28/16 0528  NA 138 138  K 3.7 3.9  CL 107 108  CO2 25 24  BUN 20 27*  CREATININE 1.10* 1.34*  GLUCOSE 128* 144*  CALCIUM 8.3* 8.2*   Lab Results  Component Value Date   INR 1.25 11/13/2016   INR 1.3 (H) 11/09/2015   INR 1.17 07/05/2015     Recent Radiographic Studies :  Dg Chest 2 View  Result Date: 11/13/2016 CLINICAL DATA:  Preoperative examination prior right total hip arthroplasty. No current chest complaints. History of hypertension, mild aortic stenosis with left ventricular diagnostic dysfunction, nonsmoker. EXAM: CHEST  2 VIEW COMPARISON:  Chest x-ray of October 19, 2010 FINDINGS: The lungs are well-expanded and clear. The heart and pulmonary vascularity are normal. The mediastinum is normal in width. There is dense calcification in the wall of the thoracic aorta. There is no pleural effusion. There is mild multilevel degenerative disc disease of the thoracic spine. IMPRESSION: Chronic  bronchitic changes. No pneumonia nor other acute cardiopulmonary abnormality. Thoracic aortic atherosclerosis. Electronically Signed   By: David  Martinique M.D.   On: 11/13/2016 15:21   Dg Hip Port Unilat With Pelvis 1v Right  Result Date: 11/26/2016 CLINICAL DATA:  Postop right hip replacement EXAM: DG HIP (WITH OR WITHOUT PELVIS) 1V PORT RIGHT COMPARISON:  None. FINDINGS: Right total hip arthroplasty without failure or complication. No acute fracture or dislocation. Mild osteoarthritis of the left hip. IMPRESSION: Right total hip arthroplasty. Electronically Signed   By: Kathreen Devoid   On: 11/26/2016 10:28    DISCHARGE INSTRUCTIONS: Discharge Instructions    Call MD / Call 911    Complete by:  As directed    If you experience chest pain or shortness of breath, CALL 911 and be transported to the hospital emergency room.  If you develope a fever above 101 F, pus (white drainage) or increased drainage or redness at the wound, or calf pain, call your surgeon's office.   Change dressing    Complete by:  As directed    Do not change dressing   Constipation Prevention    Complete by:  As directed    Drink plenty of fluids.  Prune juice may be helpful.  You may use a stool softener, such as Colace (over the counter) 100 mg twice a day.  Use MiraLax (over the counter) for constipation as needed.   Diet general    Complete by:  As directed    Discharge instructions    Complete by:  As directed    [place] items at home which could result in a fall. This includes throw rugs or furniture in walking pathways ICE to the affected joint every three hours while awake for 30 minutes at a time, for at least the first 3-5 days, and then as needed for pain and swelling.  Continue to use ice for pain and swelling. You may notice swelling that will progress down to the foot and ankle.  This is normal after surgery.  Elevate your leg when you are not up walking on it.   Continue  to use the breathing machine you got in the hospital (incentive spirometer) which will help keep your temperature down.  It is common for your temperature to cycle up and down following surgery, especially at night when you are not up moving around and exerting yourself.  The breathing machine keeps your lungs expanded and your temperature down.   DIET:  As you were doing prior to hospitalization, we recommend a well-balanced  diet.  DRESSING / WOUND CARE / SHOWERING  Keep the surgical dressing until follow up.  The dressing is water proof, so you can shower without any extra covering.  IF THE DRESSING FALLS OFF or the wound gets wet inside, change the dressing with sterile gauze.  Please use good hand washing techniques before changing the dressing.  Do not use any lotions or creams on the incision until instructed by your surgeon.    ACTIVITY  Increase activity slowly as tolerated, but follow the weight bearing instructions below.   No driving for 6 weeks or until further direction given by your physician.  You cannot drive while taking narcotics.  No lifting or carrying greater than 10 lbs. until further directed by your surgeon. Avoid periods of inactivity such as sitting longer than an hour when not asleep. This helps prevent blood clots.  You may return to work once you are authorized by your doctor.     WEIGHT BEARING   Weight bearing as tolerated with assist device (walker, cane, etc) as directed, use it as long as suggested by your surgeon or therapist, typically at least 4-6 weeks.   EXERCISES  Results after joint replacement surgery are often greatly improved when you follow the exercise, range of motion and muscle strengthening exercises prescribed by your doctor. Safety measures are also important to protect the joint from further injury. Any time any of these exercises cause you to have increased pain or swelling, decrease what you are doing until you are comfortable again and  then slowly increase them. If you have problems or questions, call your caregiver or physical therapist for advice.   Rehabilitation is important following a joint replacement. After just a few days of immobilization, the muscles of the leg can become weakened and shrink (atrophy).  These exercises are designed to build up the tone and strength of the thigh and leg muscles and to improve motion. Often times heat used for twenty to thirty minutes before working out will loosen up your tissues and help with improving the range of motion but do not use heat for the first two weeks following surgery (sometimes heat can increase post-operative swelling).   These exercises can be done on a training (exercise) mat, on the floor, on a table or on a bed. Use whatever works the best and is most comfortable for you.    Use music or television while you are exercising so that the exercises are a pleasant break in your day. This will make your life better with the exercises acting as a break in your routine that you can look forward to.   Perform all exercises about fifteen times, three times per day or as directed.  You should exercise both the operative leg and the other leg as well.   Exercises include:  Quad Sets - Tighten up the muscle on the front of the thigh (Quad) and hold for 5-10 seconds.   Straight Leg Raises - With your knee straight (if you were given a brace, keep it on), lift the leg to 60 degrees, hold for 3 seconds, and slowly lower the leg.  Perform this exercise against resistance later as your leg gets stronger.  Leg Slides: Lying on your back, slowly slide your foot toward your buttocks, bending your knee up off the floor (only go as far as is comfortable). Then slowly slide your foot back down until your leg is flat on the floor again.  Angel Wings: Lying on your  back spread your legs to the side as far apart as you can without causing discomfort.  Hamstring Strength:  Lying on your back, push  your heel against the floor with your leg straight by tightening up the muscles of your buttocks.  Repeat, but this time bend your knee to a comfortable angle, and push your heel against the floor.  You may put a pillow under the heel to make it more comfortable if necessary.   A rehabilitation program following joint replacement surgery can speed recovery and prevent re-injury in the future due to weakened muscles. Contact your doctor or a physical therapist for more information on knee rehabilitation.    CONSTIPATION  Constipation is defined medically as fewer than three stools per week and severe constipation as less than one stool per week.  Even if you have a regular bowel pattern at home, your normal regimen is likely to be disrupted due to multiple reasons following surgery.  Combination of anesthesia, postoperative narcotics, change in appetite and fluid intake all can affect your bowels.   YOU MUST use at least one of the following options; they are listed in order of increasing strength to get the job done.  They are all available over the counter, and you may need to use some, POSSIBLY even all of these options:    Drink plenty of fluids (prune juice may be helpful) and high fiber foods Colace 100 mg by mouth twice a day  Senokot for constipation as directed and as needed Dulcolax (bisacodyl), take with full glass of water  Miralax (polyethylene glycol) once or twice a day as needed.  If you have tried all these things and are unable to have a bowel movement in the first 3-4 days after surgery call either your surgeon or your primary doctor.    If you experience loose stools or diarrhea, hold the medications until you stool forms back up.  If your symptoms do not get better within 1 week or if they get worse, check with your doctor.  If you experience "the worst abdominal pain ever" or develop nausea or vomiting, please contact the office immediately for further recommendations for  treatment.   ITCHING:  If you experience itching with your medications, try taking only a single pain pill, or even half a pain pill at a time.  You can also use Benadryl over the counter for itching or also to help with sleep.   TED HOSE STOCKINGS:  Use stockings on both legs until for at least 2 weeks or as directed by physician office. They may be removed at night for sleeping.  MEDICATIONS:  See your medication summary on the "After Visit Summary" that nursing will review with you.  You may have some home medications which will be placed on hold until you complete the course of blood thinner medication.  It is important for you to complete the blood thinner medication as prescribed.  PRECAUTIONS:  If you experience chest pain or shortness of breath - call 911 immediately for transfer to the hospital emergency department.   If you develop a fever greater that 101 F, purulent drainage from wound, increased redness or drainage from wound, foul odor from the wound/dressing, or calf pain - CONTACT YOUR SURGEON.  FOLLOW-UP APPOINTMENTS:  If you do not already have a post-op appointment, please call the office for an appointment to be seen by your surgeon.  Guidelines for how soon to be seen are listed in your "After Visit Summary", but are typically between 1-4 weeks after surgery.  OTHER INSTRUCTIONS:   Knee Replacement:  Do not place pillow under knee, focus on keeping the knee straight while resting. CPM instructions: 0-90 degrees, 2 hours in the morning, 2 hours in the afternoon, and 2 hours in the evening. Place foam block, curve side up under heel at all times except when in CPM or when walking.  DO NOT modify, tear, cut, or change the foam block in any way.  MAKE SURE YOU:  Understand these instructions.  Get help right away if you are not doing well or get worse.    Thank you for letting us be a part of your medical care team.  It is a  privilege we respect greatly.  We hope these instructions will help you stay on track for a fast and full recovery!   Driving restrictions    Complete by:  As directed    No driving for6 weeks   Follow the hip precautions as taught in Physical Therapy    Complete by:  As directed    Increase activity slowly as tolerated    Complete by:  As directed    Lifting restrictions    Complete by:  As directed    No lifting for 6 weeks   Patient may shower    Complete by:  As directed    You may shower over the brown dressing. Do not remove the dressing-will be changed on first office visit   TED hose    Complete by:  As directed    Use stockings (TED hose) for4weeks onright leg(s).  You may remove them at night for sleeping.   Weight bearing as tolerated    Complete by:  As directed    Laterality:  right   Extremity:  Lower      DISCHARGE MEDICATIONS:   Allergies as of 11/28/2016      Reactions   Sulfonamide Derivatives Rash   Childhood reaction      Medication List    STOP taking these medications   amoxicillin 500 MG capsule Commonly known as:  AMOXIL   GLUCOSAMINE 1500 COMPLEX PO   meloxicam 7.5 MG tablet Commonly known as:  MOBIC   traMADol 50 MG tablet Commonly known as:  ULTRAM     TAKE these medications   CALCIUM 600 + MINERALS 600-200 MG-UNIT Tabs Take 1 tablet by mouth every evening.   carvedilol 12.5 MG tablet Commonly known as:  COREG TAKE 1 TABLET BY MOUTH 2 TIMES DAILY WITH A MEAL.   cholecalciferol 1000 units tablet Commonly known as:  VITAMIN D Take 1,000 Units by mouth every morning.   DRY EYES OP Place 1 drop into both eyes 3 (three) times daily as needed.   methocarbamol 500 MG tablet Commonly known as:  ROBAXIN Take 1 tablet (500 mg total) by mouth every 6 (six) hours as needed for muscle spasms.   multivitamin-iron-minerals-folic acid chewable tablet Chew 1 tablet by mouth daily. Has stopped prior to procedure   oxyCODONE 5 MG immediate  release tablet Commonly known as:  Oxy IR/ROXICODONE Take 1-2 tablets (5-10 mg total) by mouth every 3 (three) hours as needed for breakthrough pain.   pantoprazole 40 MG tablet Commonly known as:  PROTONIX TAKE 1 TABLET (40 MG TOTAL) BY MOUTH DAILY.   rivaroxaban 10 MG Tabs tablet Commonly known as:  XARELTO Take 1 tablet (10 mg total) by mouth daily with breakfast.   vitamin C 500 MG tablet Commonly known as:  ASCORBIC ACID Take 500 mg by mouth daily.            Durable Medical Equipment        Start     Ordered   11/26/16 1253  DME Walker rolling  Once    Question:  Patient needs a walker to treat with the following condition  Answer:  Status post total replacement of right hip   11/26/16 1253   11/26/16 1253  DME 3 n 1  Once     11/26/16 1253   11/26/16 1253  DME Bedside commode  Once    Question:  Patient needs a bedside commode to treat with the following condition  Answer:  Status post total hip replacement, right   11/26/16 1253      FOLLOW UP VISIT:   Follow-up Information    Garald Balding, MD Follow up on 12/09/2016.   Specialty:  Orthopedic Surgery Contact information: [address] [place] [address] [phone]           DISPOSITION:   Skilled Nursing Facility/Rehab  CONDITION:  Stable   Mike Craze. [place], [place] [phone]  11/28/2016 10:30 AM

## 2016-11-28 NOTE — Telephone Encounter (Signed)
[place], a Education officer, museum at [employer] would like to speak to someone about patient. Please call back today. Cb# [phone]

## 2016-11-28 NOTE — Clinical Social Work Note (Signed)
Clinical Social Worker facilitated patient discharge including contacting patient family and facility to confirm patient discharge plans.  Clinical information faxed to facility and family agreeable with plan.  CSW arranged ambulance transport via [facility] (for 1:00 per facility) to [facility] .  RN to call report prior to discharge (please see last CSW note for number and room number).  Clinical Social Worker will sign off for now as social work intervention is no longer needed. Please consult [place] again if new need arises.  [address] [place]

## 2016-11-28 NOTE — Clinical Social Work Note (Addendum)
Pt has a bed at [facility] today. Per Ivin Booty at [facility] pt's room will be available after lunch. CSW will follow up with pt and pt's RN. RN to call [phone] for report prior to d/c. Pt is going to room 602 in [place].  [address] [place]

## 2016-11-28 NOTE — Telephone Encounter (Signed)
Aaron Edelman to call

## 2016-11-28 NOTE — Progress Notes (Signed)
Physical Therapy Treatment Patient Details Name: Claudia Lara MRN: [id number] DOB: [birth year] Today's Date: 11/28/2016    History of Present Illness Pt is an 83 y.o. female now s/p Rt posterior THA. PMH: Rt TKA, HTN, PVD, dyspnea on exertion.     PT Comments    Pt presented supine in bed with HOB elevated, awake and willing to participate in therapy session. Pt having difficulty this session advancing L LE forwards for ambulation. Pt only able to take three side steps at EOB. Pt would continue to benefit from skilled physical therapy services at this time while admitted and after d/c to address her limitations in order to improve her overall safety and independence with functional mobility.   Follow Up Recommendations  SNF;Supervision for mobility/OOB     Equipment Recommendations  None recommended by PT    Recommendations for Other Services       Precautions / Restrictions Precautions Precautions: Posterior Hip;Fall Required Braces or Orthoses: Knee Immobilizer - Right Knee Immobilizer - Right: Other (comment) (pt reported MD stated that KI is only for while in bed) Restrictions Weight Bearing Restrictions: Yes RLE Weight Bearing: Weight bearing as tolerated    Mobility  Bed Mobility Overal bed mobility: Needs Assistance Bed Mobility: Supine to Sit;Sit to Supine     Supine to sit: Mod assist Sit to supine: Mod assist   General bed mobility comments: pt required increased time, use of bed rails and mod A with R LE movement  Transfers Overall transfer level: Needs assistance Equipment used: Rolling walker (2 wheeled) Transfers: Sit to/from Stand Sit to Stand: Mod assist         General transfer comment: pt required increased time, VC'ing for bilateral hand placement and mod A to rise to full standing  Ambulation/Gait Ambulation/Gait assistance: Min assist Ambulation Distance (Feet): 2 Feet Assistive device: Rolling walker (2 wheeled)   Gait velocity:  decreased   General Gait Details: pt took three side steps to her L at EOB; pt attempted multiple times to advance L LE forwards but was unable to do so   Stairs            Wheelchair Mobility    Modified Rankin (Stroke Patients Only)       Balance Overall balance assessment: Needs assistance Sitting-balance support: No upper extremity supported Sitting balance-Leahy Scale: Fair     Standing balance support: Bilateral upper extremity supported;During functional activity;Single extremity supported Standing balance-Leahy Scale: Poor Standing balance comment: pt reliant on bilateral UEs on RW                    Cognition Arousal/Alertness: Awake/alert Behavior During Therapy: WFL for tasks assessed/performed Overall Cognitive Status: Within Functional Limits for tasks assessed                      Exercises      General Comments        Pertinent Vitals/Pain Pain Assessment: Faces Faces Pain Scale: Hurts even more Pain Location: R hip Pain Descriptors / Indicators: Operative site guarding;Aching Pain Intervention(s): Monitored during session;Repositioned    Home Living                      Prior Function            PT Goals (current goals can now be found in the care plan section) Acute Rehab PT Goals Patient Stated Goal: go for more rehab and eventually get back  home PT Goal Formulation: With patient Time For Goal Achievement: 12/10/16 Potential to Achieve Goals: Good Progress towards PT goals: Progressing toward goals    Frequency    7X/week      PT Plan Current plan remains appropriate    Co-evaluation             End of Session Equipment Utilized During Treatment: Gait belt Activity Tolerance: Patient limited by pain Patient left: in bed;with call bell/phone within reach     Time: 1131-1154 PT Time Calculation (min) (ACUTE ONLY): 23 min  Charges:  $Therapeutic Activity: 23-37 mins                    G  CodesClearnce Sorrel Awab Abebe December 18, 2016, 1:31 PM Sherie Don, [place], DPT [phone]

## 2016-11-28 NOTE — Op Note (Signed)
PATIENT ID: Claudia Lara        MRN:  [id number]          DOB/AGE: Mar 17, 1932 / 83 y.o.    Joni Fears, MD   Biagio Borg, PA-C [address] [place], [place]  [address]                             [phone]   PROGRESS NOTE  Subjective:  negative for Chest Pain  negative for Shortness of Breath  negative for Nausea/Vomiting   negative for Calf Pain    Tolerating Diet: yes         Patient reports pain as mild.     Comfortable without complaints  Objective: Vital signs in last 24 hours:   Patient Vitals for the past 24 hrs:  BP Temp Temp src Pulse Resp SpO2  11/28/16 0438 (!) 108/51 100.2 F (37.9 C) Oral 99 16 95 %  11/27/16 2032 (!) 140/47 100.2 F (37.9 C) Oral 93 - 97 %  11/27/16 1600 102/73 - - 86 - -  11/27/16 1300 (!) 113/43 98.6 F (37 C) Oral (!) 114 16 97 %      Intake/Output from previous day:   01/03 0701 - 01/04 0700 In: 1030 [P.O.:930] Out: -    Intake/Output this shift:   01/04 0701 - 01/04 1900 In: 240 [P.O.:240] Out: 250 [Urine:250]   Intake/Output      01/03 0701 - 01/04 0700 01/04 0701 - 01/05 0700   P.O. 930 240   I.V.     Other     IV Piggyback 100    Total Intake 1030 240   Urine  250   Blood     Total Output   250   Net +1030 -10        Urine Occurrence 3 x    Stool Occurrence 1 x       LABORATORY DATA:  Recent Labs  11/26/16 1320 11/27/16 0648 11/28/16 0528  WBC 9.5 8.3 12.5*  HGB 8.2* 7.6* 7.1*  HCT 24.7* 22.0* 20.9*  PLT 178 182 212    Recent Labs  11/27/16 0648 11/28/16 0528  NA 138 138  K 3.7 3.9  CL 107 108  CO2 25 24  BUN 20 27*  CREATININE 1.10* 1.34*  GLUCOSE 128* 144*  CALCIUM 8.3* 8.2*   Lab Results  Component Value Date   INR 1.25 11/13/2016   INR 1.3 (H) 11/09/2015   INR 1.17 07/05/2015    Recent Radiographic Studies :  Dg Chest 2 View  Result Date: 11/13/2016 CLINICAL DATA:  Preoperative examination prior right total hip arthroplasty. No current chest complaints. History of  hypertension, mild aortic stenosis with left ventricular diagnostic dysfunction, nonsmoker. EXAM: CHEST  2 VIEW COMPARISON:  Chest x-ray of October 19, 2010 FINDINGS: The lungs are well-expanded and clear. The heart and pulmonary vascularity are normal. The mediastinum is normal in width. There is dense calcification in the wall of the thoracic aorta. There is no pleural effusion. There is mild multilevel degenerative disc disease of the thoracic spine. IMPRESSION: Chronic bronchitic changes. No pneumonia nor other acute cardiopulmonary abnormality. Thoracic aortic atherosclerosis. Electronically Signed   By: David  Martinique M.D.   On: 11/13/2016 15:21   Dg Hip Port Unilat With Pelvis 1v Right  Result Date: 11/26/2016 CLINICAL DATA:  Postop right hip replacement EXAM: DG HIP (WITH OR WITHOUT PELVIS) 1V PORT RIGHT COMPARISON:  None. FINDINGS: Right total hip arthroplasty without failure or complication. No acute fracture or dislocation. Mild osteoarthritis of the left hip. IMPRESSION: Right total hip arthroplasty. Electronically Signed   By: Kathreen Devoid   On: 11/26/2016 10:28     Examination:  General appearance: alert, cooperative and no distress  Wound Exam: clean, dry, intact   Drainage:  None: wound tissue dry  Motor Exam: EHL, FHL, Anterior Tibial and Posterior Tibial Intact  Sensory Exam: Superficial Peroneal, Deep Peroneal and Tibial normal  Vascular Exam: Normal  Assessment:    2 Days Post-Op  Procedure(s) (LRB): RIGHT TOTAL HIP ARTHROPLASTY (Right)  ADDITIONAL DIAGNOSIS:  Principal Problem:   Unilateral primary osteoarthritis, right hip Active Problems:   S/P total hip arthroplasty  Acute Blood Loss Anemia-asymptomatic   Plan: Physical Therapy as ordered Weight Bearing as Tolerated (WBAT)  DVT Prophylaxis:  Xarelto and TED hose  DISCHARGE PLAN: Skilled Nursing Facility/Rehab- [facility]  DISCHARGE NEEDS: HHPT, Walker and 3-in-1 comode seat Dressing changed and  wound clean, post op anemia appears to be stable and pt not symptomatic when OOB, VS stable- will plan D/C to rehab today       Garald Balding  11/28/2016 9:53 AM

## 2016-11-29 ENCOUNTER — Non-Acute Institutional Stay (SKILLED_NURSING_FACILITY): Payer: Medicare Other | Admitting: Internal Medicine

## 2016-11-29 ENCOUNTER — Encounter: Payer: Self-pay | Admitting: Internal Medicine

## 2016-11-29 ENCOUNTER — Telehealth (INDEPENDENT_AMBULATORY_CARE_PROVIDER_SITE_OTHER): Payer: Self-pay | Admitting: Orthopaedic Surgery

## 2016-11-29 DIAGNOSIS — D62 Acute posthemorrhagic anemia: Secondary | ICD-10-CM | POA: Diagnosis not present

## 2016-11-29 DIAGNOSIS — I10 Essential (primary) hypertension: Secondary | ICD-10-CM | POA: Diagnosis not present

## 2016-11-29 DIAGNOSIS — R2681 Unsteadiness on feet: Secondary | ICD-10-CM

## 2016-11-29 DIAGNOSIS — M1611 Unilateral primary osteoarthritis, right hip: Secondary | ICD-10-CM | POA: Diagnosis not present

## 2016-11-29 DIAGNOSIS — D72828 Other elevated white blood cell count: Secondary | ICD-10-CM | POA: Diagnosis not present

## 2016-11-29 DIAGNOSIS — K5900 Constipation, unspecified: Secondary | ICD-10-CM

## 2016-11-29 DIAGNOSIS — N183 Chronic kidney disease, stage 3 unspecified: Secondary | ICD-10-CM

## 2016-11-29 DIAGNOSIS — K219 Gastro-esophageal reflux disease without esophagitis: Secondary | ICD-10-CM

## 2016-11-29 NOTE — Telephone Encounter (Signed)
Weight bearing as tolerated to operative hip. Avoid flexion beyond 90 degrees right hip, do not cross legs. Knee immobilizer until pt is comfortable with precautions

## 2016-11-29 NOTE — Progress Notes (Signed)
LOCATION: [facility]  PCP: [name], MD   Code Status: DNR  Goals of care: Advanced Directive information Advanced Directives 11/29/2016  Does Patient Have a Medical Advance Directive? Yes  Type of Advance Directive Out of facility DNR (pink MOST or yellow form)  Does patient want to make changes to medical advance directive? No - Patient declined  Copy of [place] in Chart? -  Would patient like information on creating a medical advance directive? -       Extended Emergency Contact Information Primary Emergency Contact: Tesh,Sharon Address: [place], [place] Phone: [phone] Mobile Phone: [phone] Relation: Daughter   Allergies  Allergen Reactions  . Sulfonamide Derivatives Rash    Childhood reaction    Chief Complaint  Patient presents with  . New Admit To SNF    New Admission Visit      HPI:  Patient is a 83 y.o. female seen today for short term rehabilitation post hospital admission from 11/26/2016-11/28/2016 with right hip osteoarthritis. She underwent right total hip arthroplasty. She is seen in her room today. She complains of pain to her right hip limiting her movement. She has been taking her pain medication.  Review of Systems:  Constitutional: Negative for fever, chills, diaphoresis.  HENT: Negative for headache, congestion, nasal discharge, sore throat, difficulty swallowing.   Eyes: Negative for double vision and discharge. Wears glasses.   Respiratory: Negative for cough, shortness of breath and wheezing.   Cardiovascular: Negative for chest pain, palpitations, leg swelling.  Gastrointestinal: Negative for heartburn, nausea, vomiting, abdominal pain. Last bowel movement was 2 days ago. She had a bowel movement on daily basis at home.  Genitourinary: Negative for dysuria and flank pain.  Musculoskeletal: Negative for back pain, fall in the facility.  Skin: Negative for  itching, rash.  Neurological: Negative for dizziness. Psychiatric/Behavioral: Negative for depression.  Past Medical History:  Diagnosis Date  . ANEMIA 08/22/2009  . CAROTID ARTERY STENOSIS 01/16/2010  . Complication of anesthesia    very hard to wake up from surgery  . DYSPNEA ON EXERTION 11/16/2007  . Gallstones   . GEN OSTEOARTHROSIS INVOLVING MULTIPLE SITES 11/11/2008  . GERD (gastroesophageal reflux disease)   . HEMORRHOIDS, INTERNAL    surgery was in the 90's (per patient)  . NECK PAIN, ACUTE 12/28/2009  . PERIPHERAL EDEMA 10/06/2007  . PVD 10/06/2007  . Unspecified essential hypertension 10/19/2007   Past Surgical History:  Procedure Laterality Date  . ABDOMINAL HYSTERECTOMY    . BUNIONECTOMY     bilat  . CHOLECYSTECTOMY  01/29/2016   at [facility]  . ENDOSCOPIC RETROGRADE CHOLANGIOPANCREATOGRAPHY (ERCP) WITH PROPOFOL N/A 12/01/2015   Procedure: ENDOSCOPIC RETROGRADE CHOLANGIOPANCREATOGRAPHY (ERCP) WITH PROPOFOL;  Surgeon: [name], MD;  Location: [facility];  Service: Endoscopy;  Laterality: N/A;  . ENDOSCOPIC RETROGRADE CHOLANGIOPANCREATOGRAPHY (ERCP) WITH PROPOFOL N/A 02/01/2016   Procedure: ENDOSCOPIC RETROGRADE CHOLANGIOPANCREATOGRAPHY (ERCP) WITH PROPOFOL;  Surgeon: [name], MD;  Location: [facility];  Service: Endoscopy;  Laterality: N/A;  . Hyperplastic colon polyps, removed  2007   By Dr. Penelope Coop  . JOINT REPLACEMENT  2011   right knee  . OOPHORECTOMY    . ROTATOR CUFF REPAIR  2004   left (Dr. Durward Fortes)  . TOTAL HIP ARTHROPLASTY Right 11/26/2016   Procedure: RIGHT TOTAL HIP ARTHROPLASTY;  Surgeon: Garald Balding, MD;  Location: [facility];  Service: Orthopedics;  Laterality: Right;  . TOTAL KNEE ARTHROPLASTY Right    Social History:   reports that she has never smoked. She has never used smokeless tobacco. She reports that she does not drink alcohol or use drugs.  Family History  Problem Relation Age of Onset  . Ovarian cancer  Mother   . Cancer Mother   . Leukemia Father   . Lung cancer Father   . Cancer Father   . Cancer Brother   . Diabetes Neg Hx   . Heart disease Neg Hx   . Hypertension Neg Hx     Medications: Allergies as of 11/29/2016      Reactions   Sulfonamide Derivatives Rash   Childhood reaction      Medication List       Accurate as of 11/29/16 11:45 AM. Always use your most recent med list.          CALCIUM 600 + MINERALS 600-200 MG-UNIT Tabs Take 1 tablet by mouth every evening.   carvedilol 12.5 MG tablet Commonly known as:  COREG TAKE 1 TABLET BY MOUTH 2 TIMES DAILY WITH A MEAL.   cholecalciferol 1000 units tablet Commonly known as:  VITAMIN D Take 1,000 Units by mouth every morning.   DRY EYES OP Place 1 drop into both eyes 3 (three) times daily as needed.   methocarbamol 500 MG tablet Commonly known as:  ROBAXIN Take 1 tablet (500 mg total) by mouth every 6 (six) hours as needed for muscle spasms.   multivitamin-iron-minerals-folic acid chewable tablet Chew 1 tablet by mouth daily. Has stopped prior to procedure   oxyCODONE 5 MG immediate release tablet Commonly known as:  Oxy IR/ROXICODONE Take 1-2 tablets (5-10 mg total) by mouth every 3 (three) hours as needed for breakthrough pain.   pantoprazole 40 MG tablet Commonly known as:  PROTONIX TAKE 1 TABLET (40 MG TOTAL) BY MOUTH DAILY.   rivaroxaban 10 MG Tabs tablet Commonly known as:  XARELTO Take 1 tablet (10 mg total) by mouth daily with breakfast.   vitamin C 500 MG tablet Commonly known as:  ASCORBIC ACID Take 500 mg by mouth daily.       Immunizations: Immunization History  Administered Date(s) Administered  . Influenza Whole 08/26/2009, 08/25/2012  . Influenza, High Dose Seasonal PF 08/03/2015, 08/01/2016  . Influenza-Unspecified 07/26/2013, 07/28/2014  . Pneumococcal Conjugate-13 02/01/2014  . Pneumococcal Polysaccharide-23 05/15/2015  . Td 07/16/2012  . Zoster 01/23/2013     Physical  Exam: Vitals:   11/29/16 1133  BP: (!) 123/53  Pulse: 73  Resp: 16  Temp: 99.7 F (37.6 C)  TempSrc: Oral  SpO2: 98%  Weight: 169 lb 14.4 oz (77.1 kg)  Height: 5' 3.5" (1.613 m)   Body mass index is 29.62 kg/m.  General- elderly female, well built, in no acute distress Head- normocephalic, atraumatic Nose- no nasal discharge Throat- moist mucus membrane Eyes- PERRLA, EOMI, no pallor, no icterus, no discharge, normal conjunctiva, normal sclera Neck- no cervical lymphadenopathy Cardiovascular- normal s1,s2, no murmur Respiratory- bilateral clear to auscultation, no wheeze, no rhonchi, no crackles, no use of accessory muscles Abdomen- bowel sounds present, soft, non tender Musculoskeletal- able to move all 4 extremities, limited range of motion to right hip, trace right leg edema Neurological- alert and oriented to person, place and time Skin- warm and dry, right hip surgical incision with Aquacel dressing in place Psychiatry- normal mood and affect    Labs reviewed: Basic Metabolic Panel:  Recent Labs  11/13/16 1131 11/27/16  [id number] 11/28/16 0528  NA 136 138 138  K 4.1 3.7 3.9  CL 104 107 108  CO2 25 25 24   GLUCOSE 105* 128* 144*  BUN 21* 20 27*  CREATININE 1.17* 1.10* 1.34*  CALCIUM 9.1 8.3* 8.2*   Liver Function Tests:  Recent Labs  07/09/16 1136 11/13/16 1131  AST 18 23  ALT 12 16  ALKPHOS 75 78  BILITOT 2.0* 3.0*  PROT 6.6 6.2*  ALBUMIN 4.4 4.2   No results for input(s): LIPASE, AMYLASE in the last 8760 hours. No results for input(s): AMMONIA in the last 8760 hours. CBC:  Recent Labs  03/05/16 1128 07/09/16 1136 11/13/16 1131 11/26/16 1320 11/27/16 0648 11/28/16 0528  WBC 7.4 8.1 8.5 9.5 8.3 12.5*  NEUTROABS 5.2 5.5 6.2  --   --   --   HGB 9.7* 10.1* 10.1* 8.2* 7.6* 7.1*  HCT 28.3* 29.1* 30.4* 24.7* 22.0* 20.9*  MCV 88.3 90.2 90.7 91.8 89.8 90.9  PLT 219.0 225.0 232 178 182 212   Cardiac Enzymes: No results for input(s): CKTOTAL, CKMB,  CKMBINDEX, TROPONINI in the last 8760 hours. BNP: Invalid input(s): POCBNP CBG: No results for input(s): GLUCAP in the last 8760 hours.  Radiological Exams: Dg Chest 2 View  Result Date: 11/13/2016 CLINICAL DATA:  Preoperative examination prior right total hip arthroplasty. No current chest complaints. History of hypertension, mild aortic stenosis with left ventricular diagnostic dysfunction, nonsmoker. EXAM: CHEST  2 VIEW COMPARISON:  Chest x-ray of October 19, 2010 FINDINGS: The lungs are well-expanded and clear. The heart and pulmonary vascularity are normal. The mediastinum is normal in width. There is dense calcification in the wall of the thoracic aorta. There is no pleural effusion. There is mild multilevel degenerative disc disease of the thoracic spine. IMPRESSION: Chronic bronchitic changes. No pneumonia nor other acute cardiopulmonary abnormality. Thoracic aortic atherosclerosis. Electronically Signed   By: David  Martinique M.D.   On: 11/13/2016 15:21   Dg Hip Port Unilat With Pelvis 1v Right  Result Date: 11/26/2016 CLINICAL DATA:  Postop right hip replacement EXAM: DG HIP (WITH OR WITHOUT PELVIS) 1V PORT RIGHT COMPARISON:  None. FINDINGS: Right total hip arthroplasty without failure or complication. No acute fracture or dislocation. Mild osteoarthritis of the left hip. IMPRESSION: Right total hip arthroplasty. Electronically Signed   By: Kathreen Devoid   On: 11/26/2016 10:28    Assessment/Plan  Unsteady gait Status post right hip surgical repair. Will need for her to work with therapy team to work on her gait and balance.  Right hip osteoarthritis Status post right hip total arthroplasty. Has follow-up with orthopedic. Will need to work with physical therapy and occupational therapy team to help with gait training, balance and transfers. Fall precautions to be taken. Continue Robaxin 500 mg every 6 hours as needed for muscle spasm and oxycodone 5 mg 1-2 tablet every 3 hours as needed  for pain. Continue Xarelto for DVT prophylaxis. Continue calcium and vitamin D supplement. Get PMR consult.  Blood loss anemia Post op. Monitor CBC  Leukocytosis Afebrile. Likely reactive leukocytosis. Monitor WBC curve  Constipation Start senna S 2 tablet at bedtime and MiraLAX daily as needed for now and monitor.   Chronic kidney disease stage III Check BMP  Hypertension Continue Coreg 12.5 mg twice a day and monitor blood pressure readings.  Gastroesophageal reflux disease Stable on Protonix. No changes made.    Goals of care: short term rehabilitation   Labs/tests ordered: CBC, BMP 12/02/2016  Family/ staff Communication: reviewed  care plan with patient and nursing supervisor    Blanchie Serve, MD Internal Medicine [place], [place] [address] Cell Phone (Monday-Friday 8 am - 5 pm): [phone] On Call: [phone] and follow prompts after 5 pm and on weekends Office Phone: [phone] Office Fax: [phone]

## 2016-11-29 NOTE — Telephone Encounter (Signed)
Please see note below and I will call

## 2016-11-29 NOTE — Telephone Encounter (Signed)
Claudia Lara w/[facility] cb# [phone] needs a faxed order sent to [phone] [facility]. Order they have states weight bearing as tolerated they would like more hip precautions.

## 2016-12-02 ENCOUNTER — Encounter (HOSPITAL_COMMUNITY): Payer: Self-pay

## 2016-12-02 ENCOUNTER — Emergency Department (HOSPITAL_COMMUNITY)
Admission: EM | Admit: 2016-12-02 | Discharge: 2016-12-03 | Disposition: A | Discharge status: Home / Self Care | Payer: Medicare Other | Attending: Emergency Medicine | Admitting: Emergency Medicine

## 2016-12-02 DIAGNOSIS — Z96641 Presence of right artificial hip joint: Secondary | ICD-10-CM | POA: Diagnosis not present

## 2016-12-02 DIAGNOSIS — Z96651 Presence of right artificial knee joint: Secondary | ICD-10-CM | POA: Insufficient documentation

## 2016-12-02 DIAGNOSIS — N183 Chronic kidney disease, stage 3 (moderate): Secondary | ICD-10-CM | POA: Diagnosis not present

## 2016-12-02 DIAGNOSIS — R791 Abnormal coagulation profile: Secondary | ICD-10-CM | POA: Diagnosis not present

## 2016-12-02 DIAGNOSIS — R2681 Unsteadiness on feet: Secondary | ICD-10-CM | POA: Diagnosis not present

## 2016-12-02 DIAGNOSIS — M6281 Muscle weakness (generalized): Secondary | ICD-10-CM | POA: Diagnosis not present

## 2016-12-02 DIAGNOSIS — D649 Anemia, unspecified: Secondary | ICD-10-CM | POA: Diagnosis not present

## 2016-12-02 DIAGNOSIS — M25551 Pain in right hip: Secondary | ICD-10-CM | POA: Diagnosis not present

## 2016-12-02 LAB — COMPREHENSIVE METABOLIC PANEL
ALT: 19 U/L (ref 14–54)
ANION GAP: 6 (ref 5–15)
AST: 21 U/L (ref 15–41)
Albumin: 3 g/dL — ABNORMAL LOW (ref 3.5–5.0)
Alkaline Phosphatase: 58 U/L (ref 38–126)
BUN: 27 mg/dL — ABNORMAL HIGH (ref 6–20)
CALCIUM: 8 mg/dL — AB (ref 8.9–10.3)
CHLORIDE: 109 mmol/L (ref 101–111)
CO2: 24 mmol/L (ref 22–32)
Creatinine, Ser: 1.15 mg/dL — ABNORMAL HIGH (ref 0.44–1.00)
GFR, EST AFRICAN AMERICAN: 50 mL/min — AB (ref >60)
GFR, EST NON AFRICAN AMERICAN: 43 mL/min — AB (ref >60)
Glucose, Bld: 132 mg/dL — ABNORMAL HIGH (ref 65–99)
Potassium: 3.7 mmol/L (ref 3.5–5.1)
SODIUM: 139 mmol/L (ref 135–145)
Total Bilirubin: 2.2 mg/dL — ABNORMAL HIGH (ref 0.3–1.2)
Total Protein: 5.5 g/dL — ABNORMAL LOW (ref 6.5–8.1)

## 2016-12-02 LAB — PROTIME-INR
INR: 1.66
Prothrombin Time: 19.8 seconds — ABNORMAL HIGH (ref 11.4–15.2)

## 2016-12-02 LAB — CBC AND DIFFERENTIAL
HEMATOCRIT: 18 % — AB (ref 36–46)
HEMOGLOBIN: 5.8 g/dL — AB (ref 12.0–16.0)
Platelets: 302 10*3/uL (ref 150–399)
WBC: 9.3 10^3/mL

## 2016-12-02 LAB — CBC WITH DIFFERENTIAL/PLATELET
BASOS PCT: 0 %
Basophils Absolute: 0 10*3/uL (ref 0.0–0.1)
EOS ABS: 0.4 10*3/uL (ref 0.0–0.7)
Eosinophils Relative: 3 %
HCT: 18.1 % — ABNORMAL LOW (ref 36.0–46.0)
Hemoglobin: 5.9 g/dL — CL (ref 12.0–15.0)
LYMPHS ABS: 1.5 10*3/uL (ref 0.7–4.0)
LYMPHS PCT: 12 %
MCH: 29.9 pg (ref 26.0–34.0)
MCHC: 32.6 g/dL (ref 30.0–36.0)
MCV: 91.9 fL (ref 78.0–100.0)
MONO ABS: 1.1 10*3/uL — AB (ref 0.1–1.0)
Monocytes Relative: 9 %
NEUTROS ABS: 9.1 10*3/uL — AB (ref 1.7–7.7)
Neutrophils Relative %: 76 %
PLATELETS: 307 10*3/uL (ref 150–400)
RBC: 1.97 MIL/uL — ABNORMAL LOW (ref 3.87–5.11)
RDW: 20.2 % — ABNORMAL HIGH (ref 11.5–15.5)
WBC: 12.1 10*3/uL — ABNORMAL HIGH (ref 4.0–10.5)

## 2016-12-02 LAB — PREPARE RBC (CROSSMATCH)

## 2016-12-02 LAB — BASIC METABOLIC PANEL
BUN: 24 mg/dL — AB (ref 4–21)
Creatinine: 1 mg/dL (ref 0.5–1.1)
GLUCOSE: 158 mg/dL
POTASSIUM: 3.7 mmol/L (ref 3.4–5.3)
SODIUM: 142 mmol/L (ref 137–147)

## 2016-12-02 LAB — ABO/RH: ABO/RH(D): A POS

## 2016-12-02 MED ORDER — SODIUM CHLORIDE 0.9 % IV SOLN: Freq: Once | INTRAVENOUS | Status: DC

## 2016-12-02 NOTE — Telephone Encounter (Signed)
Faxed 12/02/16.

## 2016-12-02 NOTE — ED Triage Notes (Signed)
Patient arrives from [facility] a right hip replacement last week.  Lab work today shows a Hgb of 5.0. Patient brought in for evaluation

## 2016-12-02 NOTE — ED Notes (Signed)
Dressing to right hip dry and intact

## 2016-12-02 NOTE — ED Notes (Signed)
Decreasing Hgb since surgery

## 2016-12-02 NOTE — ED Notes (Signed)
Bed: [id number] Expected date:  Expected time:  Means of arrival:  Comments: EMS 83 yo feom SNF-generalized weakness-Hgb 5

## 2016-12-03 ENCOUNTER — Non-Acute Institutional Stay (SKILLED_NURSING_FACILITY): Payer: Medicare Other | Admitting: Adult Health

## 2016-12-03 ENCOUNTER — Encounter: Payer: Self-pay | Admitting: Adult Health

## 2016-12-03 DIAGNOSIS — Z96641 Presence of right artificial hip joint: Secondary | ICD-10-CM | POA: Diagnosis not present

## 2016-12-03 DIAGNOSIS — B029 Zoster without complications: Secondary | ICD-10-CM | POA: Diagnosis not present

## 2016-12-03 DIAGNOSIS — K219 Gastro-esophageal reflux disease without esophagitis: Secondary | ICD-10-CM | POA: Diagnosis not present

## 2016-12-03 DIAGNOSIS — I10 Essential (primary) hypertension: Secondary | ICD-10-CM | POA: Diagnosis not present

## 2016-12-03 DIAGNOSIS — D62 Acute posthemorrhagic anemia: Secondary | ICD-10-CM

## 2016-12-03 DIAGNOSIS — N183 Chronic kidney disease, stage 3 (moderate): Secondary | ICD-10-CM | POA: Diagnosis not present

## 2016-12-03 DIAGNOSIS — M6281 Muscle weakness (generalized): Secondary | ICD-10-CM | POA: Diagnosis not present

## 2016-12-03 DIAGNOSIS — R2681 Unsteadiness on feet: Secondary | ICD-10-CM | POA: Diagnosis not present

## 2016-12-03 DIAGNOSIS — M25551 Pain in right hip: Secondary | ICD-10-CM | POA: Diagnosis not present

## 2016-12-03 LAB — HEMOGLOBIN AND HEMATOCRIT, BLOOD
HEMATOCRIT: 24.9 % — AB (ref 36.0–46.0)
Hemoglobin: 8.2 g/dL — ABNORMAL LOW (ref 12.0–15.0)

## 2016-12-03 MED ORDER — METHOCARBAMOL 500 MG PO TABS: 500.0000 mg | ORAL_TABLET | Freq: Three times a day (TID) | ORAL | Status: DC | PRN

## 2016-12-03 MED ORDER — OXYCODONE HCL 5 MG PO TABS: 5.0000 mg | ORAL_TABLET | Freq: Four times a day (QID) | ORAL | Status: DC | PRN

## 2016-12-03 NOTE — Progress Notes (Signed)
DATE:  12/03/2016   MRN:  [id number]  BIRTHDAY: 23-Nov-1932  Facility:  [facility] Location:  [place] [facility] [place] Room Number: [id number]  LEVEL OF CARE:  SNF (31)  Contact Information    Name Relation Home Work Mobile   Tesh,Claudia Lara Daughter [phone]  [phone]       Code Status History    Date Active Date Inactive Code Status Order ID Comments User Context   11/26/2016 12:53 PM 11/28/2016  5:36 PM Full Code [id number]  Cherylann Ratel, PA-C Inpatient    Advance Directive Documentation   Flowsheet Row Most Recent Value  Type of Advance Directive  Out of facility DNR (pink MOST or yellow form)  Pre-existing out of facility DNR order (yellow form or pink MOST form)  Pink MOST form placed in chart (order not valid for inpatient use)  "MOST" Form in Place?  No data       Chief Complaint  Patient presents with  . Acute Visit    Shingles    HISTORY OF PRESENT ILLNESS:  This is an 28-YO female seen for an acute visit due to shingles.  She is a short-term care resident of [facility], having been admitted 11/28/2016 following an admission at [facility] 11/26/2016-11/28/2016 for right total hip arthroplasty.  She went to the ER 12/02/2016 for postoperative anemia and was transfused with 2 units of blood, resulting in a (hemoglobin 5.8, hematocrit 18.1) hemoglobin of 8.2 and hematocrit of 24.9.    PAST MEDICAL HISTORY:  Past Medical History:  Diagnosis Date  . ANEMIA 08/22/2009  . CAROTID ARTERY STENOSIS 01/16/2010  . Complication of anesthesia    very hard to wake up from surgery  . DYSPNEA ON EXERTION 11/16/2007  . Gallstones   . GEN OSTEOARTHROSIS INVOLVING MULTIPLE SITES 11/11/2008  . GERD (gastroesophageal reflux disease)   . HEMORRHOIDS, INTERNAL    surgery was in the 90's (per patient)  . NECK PAIN, ACUTE 12/28/2009  . PERIPHERAL EDEMA 10/06/2007  . PVD 10/06/2007  . Unspecified essential hypertension 10/19/2007     CURRENT  MEDICATIONS: Reviewed  Patient's Medications  New Prescriptions   No medications on file  Previous Medications   ACETAMINOPHEN (TYLENOL) 325 MG TABLET    Take 650 mg by mouth every morning.   ARTIFICIAL TEAR OINTMENT (DRY EYES OP)    Place 1 drop into both eyes 3 (three) times daily as needed.   CALCIUM CARBONATE-VIT D-MIN (CALCIUM 600 + MINERALS) 600-200 MG-UNIT TABS    Take 1 tablet by mouth every evening.    CARVEDILOL (COREG) 12.5 MG TABLET    TAKE 1 TABLET BY MOUTH 2 TIMES DAILY WITH A MEAL.   CHOLECALCIFEROL (VITAMIN D) 1000 UNITS TABLET    Take 1,000 Units by mouth daily.   METHOCARBAMOL (ROBAXIN) 500 MG TABLET    Take 1 tablet (500 mg total) by mouth every 6 (six) hours as needed for muscle spasms.   MULTIVITAMIN-IRON-MINERALS-FOLIC ACID (CENTRUM) CHEWABLE TABLET    Chew 1 tablet by mouth daily. Has stopped prior to procedure   OXYCODONE (OXY IR/ROXICODONE) 5 MG IMMEDIATE RELEASE TABLET    Take 1-2 tablets (5-10 mg total) by mouth every 3 (three) hours as needed for breakthrough pain.   PANTOPRAZOLE (PROTONIX) 40 MG TABLET    TAKE 1 TABLET (40 MG TOTAL) BY MOUTH DAILY.   POLYETHYLENE GLYCOL (MIRALAX / GLYCOLAX) PACKET    Take 17 g by mouth daily as needed for mild constipation.  RIVAROXABAN (XARELTO) 10 MG TABS TABLET    Take 1 tablet (10 mg total) by mouth daily with breakfast.   SENNOSIDES-DOCUSATE SODIUM (SENOKOT-S) 8.6-50 MG TABLET    Take 2 tablets by mouth at bedtime.   VITAMIN C (ASCORBIC ACID) 500 MG TABLET    Take 500 mg by mouth daily.  Modified Medications   No medications on file  Discontinued Medications   No medications on file     Allergies  Allergen Reactions  . Sulfonamide Derivatives Rash    Childhood reaction     REVIEW OF SYSTEMS:  GENERAL: no change in appetite, no fatigue, no weight changes, no fever, chills or weakness EYES: Denies change in vision, dry eyes, eye pain, itching or discharge EARS: Denies change in hearing, ringing in ears, or  earache NOSE: Denies nasal congestion or epistaxis MOUTH and THROAT: Denies oral discomfort, gingival pain or bleeding, pain from teeth or hoarseness   RESPIRATORY: no cough, SOB, DOE, wheezing, hemoptysis CARDIAC: no chest pain, edema or palpitations GI: no abdominal pain, diarrhea, constipation, heart burn, nausea or vomiting GU: Denies dysuria, frequency, hematuria, incontinence, or discharge PSYCHIATRIC: Denies feeling of depression or anxiety. No report of hallucinations, insomnia, paranoia, or agitation     PHYSICAL EXAMINATION  GENERAL APPEARANCE: Well nourished. In no acute distress. Normal body habitus SKIN:  Left inner buttock has small blisters on left buttock HEAD: Normal in size and contour. No evidence of trauma EYES: Lids open and close normally. No blepharitis, entropion or ectropion. PERRL. Conjunctivae are clear and sclerae are white. Lenses are without opacity EARS: Pinnae are normal. Patient hears normal voice tunes of the examiner MOUTH and THROAT: Lips are without lesions. Oral mucosa is moist and without lesions. Tongue is normal in shape, size, and color and without lesions NECK: supple, trachea midline, no neck masses, no thyroid tenderness, no thyromegaly LYMPHATICS: no LAN in the neck, no supraclavicular LAN RESPIRATORY: breathing is even & unlabored, BS CTAB CARDIAC: RRR, no murmur,no extra heart sounds, no edema GI: abdomen soft, normal BS, no masses, no tenderness, no hepatomegaly, no splenomegaly EXTREMITIES:  Able to move X 4 extremities PSYCHIATRIC: Alert and oriented X 3. Affect and behavior are appropriate  LABS/RADIOLOGY: Labs reviewed: Basic Metabolic Panel:  Recent Labs  11/27/16 0648 11/28/16 0528 12/02/16 12/02/16 2220  NA 138 138 142 139  K 3.7 3.9 3.7 3.7  CL 107 108  --  109  CO2 25 24  --  24  GLUCOSE 128* 144*  --  132*  BUN 20 27* 24* 27*  CREATININE 1.10* 1.34* 1.0 1.15*  CALCIUM 8.3* 8.2*  --  8.0*   Liver Function  Tests:  Recent Labs  07/09/16 1136 11/13/16 1131 12/02/16 2220  AST 18 23 21   ALT 12 16 19   ALKPHOS 75 78 58  BILITOT 2.0* 3.0* 2.2*  PROT 6.6 6.2* 5.5*  ALBUMIN 4.4 4.2 3.0*   CBC:  Recent Labs  07/09/16 1136 11/13/16 1131  11/27/16 0648 11/28/16 0528 12/02/16 12/02/16 2220 12/03/16 0715  WBC 8.1 8.5  < > 8.3 12.5* 9.3 12.1*  --   NEUTROABS 5.5 6.2  --   --   --   --  9.1*  --   HGB 10.1* 10.1*  < > 7.6* 7.1* 5.8* 5.9* 8.2*  HCT 29.1* 30.4*  < > 22.0* 20.9* 18* 18.1* 24.9*  MCV 90.2 90.7  < > 89.8 90.9  --  91.9  --   PLT 225.0 232  < >  182 212 302 307  --   < > = values in this interval not displayed. Lipid Panel:  Recent Labs  07/09/16 1136  HDL 62.60    Dg Chest 2 View  Result Date: 11/13/2016 CLINICAL DATA:  Preoperative examination prior right total hip arthroplasty. No current chest complaints. History of hypertension, mild aortic stenosis with left ventricular diagnostic dysfunction, nonsmoker. EXAM: CHEST  2 VIEW COMPARISON:  Chest x-ray of October 19, 2010 FINDINGS: The lungs are well-expanded and clear. The heart and pulmonary vascularity are normal. The mediastinum is normal in width. There is dense calcification in the wall of the thoracic aorta. There is no pleural effusion. There is mild multilevel degenerative disc disease of the thoracic spine. IMPRESSION: Chronic bronchitic changes. No pneumonia nor other acute cardiopulmonary abnormality. Thoracic aortic atherosclerosis. Electronically Signed   By: David  Martinique M.D.   On: 11/13/2016 15:21   Dg Hip Port Unilat With Pelvis 1v Right  Result Date: 11/26/2016 CLINICAL DATA:  Postop right hip replacement EXAM: DG HIP (WITH OR WITHOUT PELVIS) 1V PORT RIGHT COMPARISON:  None. FINDINGS: Right total hip arthroplasty without failure or complication. No acute fracture or dislocation. Mild osteoarthritis of the left hip. IMPRESSION: Right total hip arthroplasty. Electronically Signed   By: Kathreen Devoid   On:  11/26/2016 10:28    ASSESSMENT/PLAN:  Shingles - Start on Zovirax 800 mg 1 tab PO 5 times a day x7 days  Anemia, acute blood loss - was sent to ED yesterday and had transfusion of 2 units PRBC Lab Results  Component Value Date   HGB 8.2 (L) 12/03/2016    Hypertension - continue Coreg 12.5 mg PO Q D  GERD - stable; continue Protonix    Ethelmae Ringel C. Clay  - NP [employer] [phone]

## 2016-12-03 NOTE — ED Notes (Signed)
Patient given extra breakfast tray.

## 2016-12-03 NOTE — ED Provider Notes (Signed)
Patient signed out pending blood transfusion. She received 2 units of blood and repeat hemoglobin 8.2 with hematocrit of 24.9. She feels well. She states she is ready to go back to her living facility and engage in physical therapy.  After history, exam, and medical workup I feel the patient has been appropriately medically screened and is safe for discharge home. Pertinent diagnoses were discussed with the patient. Patient was given return precautions.    Merryl Hacker, MD 12/03/16 [phone]

## 2016-12-03 NOTE — ED Notes (Signed)
[facility], was transferred to line that rang and rang with no answer.

## 2016-12-03 NOTE — ED Provider Notes (Signed)
[facility] DEPT Provider Note   CSN: [id number] Arrival date & time: 12/02/16  2109     History   Chief Complaint Chief Complaint  Patient presents with  . Weakness    HPI Claudia Lara is a 83 y.o. female.  HPI    83 year old female with a history of anemia, carotid artery stenosis, hypertension, recent total right hip arthroplasty on January 2 on Xarelto for DVT ppx currently at [facility] rehab facility presents with concern for anemia. Denies any source of bleeding, including no vaginal bleeding, no black or bloody stools, no hematemesis. Reports she had similar worsening of her chronic anemia following her knee surgery and required transfusion.  Has chronic anemia and they have been following it at the facility since her surgery.  Today, had a check of her hgb and it was found to be 5 and was sent to ED.  Denies shortness of breath, chest pain, lightheadedness or fatigue.  Does report she began rehab today and felt a bit tired as she was exerting herself with physical therapy. Her fatigue and dyspnea improved at rest and she currently feels at baseline. Reports they gave her stool softener when she was already having loose stool which made it worse, however no black tarry or bloody stool.   Past Medical History:  Diagnosis Date  . ANEMIA 08/22/2009  . CAROTID ARTERY STENOSIS 01/16/2010  . Complication of anesthesia    very hard to wake up from surgery  . DYSPNEA ON EXERTION 11/16/2007  . Gallstones   . GEN OSTEOARTHROSIS INVOLVING MULTIPLE SITES 11/11/2008  . GERD (gastroesophageal reflux disease)   . HEMORRHOIDS, INTERNAL    surgery was in the 90's (per patient)  . NECK PAIN, ACUTE 12/28/2009  . PERIPHERAL EDEMA 10/06/2007  . PVD 10/06/2007  . Unspecified essential hypertension 10/19/2007    Patient Active Problem List   Diagnosis Date Noted  . Unilateral primary osteoarthritis, right hip 11/26/2016  . S/P total hip arthroplasty 11/26/2016  . GERD  (gastroesophageal reflux disease) 08/03/2015  . Hemolytic anemia ([place]) 07/12/2015  . Chronic venous insufficiency 08/10/2014  . Aortic stenosis, mild 06/29/2014  . Left ventricular diastolic dysfunction, NYHA class 1 06/29/2014  . Senile osteopenia 04/21/2014  . Hyperlipidemia with target LDL less than 130 04/21/2014  . Kidney disease, chronic, stage III (GFR 30-59 ml/min) 04/21/2014  . Routine health maintenance 07/16/2012  . Carotid artery stenosis without cerebral infarction 01/16/2010  . GEN OSTEOARTHROSIS INVOLVING MULTIPLE SITES 11/11/2008  . Essential hypertension 10/19/2007    Past Surgical History:  Procedure Laterality Date  . ABDOMINAL HYSTERECTOMY    . BUNIONECTOMY     bilat  . CHOLECYSTECTOMY  01/29/2016   at [facility]  . ENDOSCOPIC RETROGRADE CHOLANGIOPANCREATOGRAPHY (ERCP) WITH PROPOFOL N/A 12/01/2015   Procedure: ENDOSCOPIC RETROGRADE CHOLANGIOPANCREATOGRAPHY (ERCP) WITH PROPOFOL;  Surgeon: [name], MD;  Location: [facility];  Service: Endoscopy;  Laterality: N/A;  . ENDOSCOPIC RETROGRADE CHOLANGIOPANCREATOGRAPHY (ERCP) WITH PROPOFOL N/A 02/01/2016   Procedure: ENDOSCOPIC RETROGRADE CHOLANGIOPANCREATOGRAPHY (ERCP) WITH PROPOFOL;  Surgeon: [name], MD;  Location: [facility];  Service: Endoscopy;  Laterality: N/A;  . Hyperplastic colon polyps, removed  2007   By Dr. Penelope Coop  . JOINT REPLACEMENT  2011   right knee  . OOPHORECTOMY    . right hip replacement    . ROTATOR CUFF REPAIR  2004   left (Dr. Durward Fortes)  . TOTAL HIP ARTHROPLASTY Right 11/26/2016   Procedure: RIGHT TOTAL HIP ARTHROPLASTY;  Surgeon: Vonna Kotyk  Durward Fortes, MD;  Location: [facility];  Service: Orthopedics;  Laterality: Right;  . TOTAL KNEE ARTHROPLASTY Right     OB History    No data available       Home Medications    Prior to Admission medications   Medication Sig Start Date End Date Taking? Authorizing Provider  Artificial Tear Ointment (DRY EYES OP) Place 1 drop  into both eyes 3 (three) times daily as needed.   Yes Historical Provider, MD  Calcium Carbonate-Vit D-Min (CALCIUM 600 + MINERALS) 600-200 MG-UNIT TABS Take 1 tablet by mouth every evening.    Yes Historical Provider, MD  carvedilol (COREG) 12.5 MG tablet TAKE 1 TABLET BY MOUTH 2 TIMES DAILY WITH A MEAL. Patient taking differently: TAKE 12.5 MG BY MOUTH 2 TIMES DAILY WITH A MEAL. 06/07/16  Yes Janith Lima, MD  cholecalciferol (VITAMIN D) 1000 units tablet Take 1,000 Units by mouth daily.   Yes Historical Provider, MD  methocarbamol (ROBAXIN) 500 MG tablet Take 1 tablet (500 mg total) by mouth every 6 (six) hours as needed for muscle spasms. 11/28/16  Yes Garald Balding, MD  multivitamin-iron-minerals-folic acid (CENTRUM) chewable tablet Chew 1 tablet by mouth daily. Has stopped prior to procedure   Yes Historical Provider, MD  oxyCODONE (OXY IR/ROXICODONE) 5 MG immediate release tablet Take 1-2 tablets (5-10 mg total) by mouth every 3 (three) hours as needed for breakthrough pain. 11/28/16  Yes Garald Balding, MD  pantoprazole (PROTONIX) 40 MG tablet TAKE 1 TABLET (40 MG TOTAL) BY MOUTH DAILY. 09/23/16  Yes Janith Lima, MD  rivaroxaban (XARELTO) 10 MG TABS tablet Take 1 tablet (10 mg total) by mouth daily with breakfast. 11/28/16  Yes Garald Balding, MD  vitamin C (ASCORBIC ACID) 500 MG tablet Take 500 mg by mouth daily.   Yes Historical Provider, MD    Family History Family History  Problem Relation Age of Onset  . Ovarian cancer Mother   . Cancer Mother   . Leukemia Father   . Lung cancer Father   . Cancer Father   . Cancer Brother   . Diabetes Neg Hx   . Heart disease Neg Hx   . Hypertension Neg Hx     Social History Social History  Substance Use Topics  . Smoking status: Never Smoker  . Smokeless tobacco: Never Used  . Alcohol use No     Allergies   Sulfonamide derivatives   Review of Systems Review of Systems  Constitutional: Negative for chills and fever.    HENT: Negative for ear pain and sore throat.   Eyes: Negative for pain and visual disturbance.  Respiratory: Negative for cough and shortness of breath.   Cardiovascular: Negative for chest pain.  Gastrointestinal: Negative for abdominal pain, blood in stool, nausea and vomiting.  Genitourinary: Negative for dysuria and hematuria.  Musculoskeletal: Positive for arthralgias (post operative hip pain). Negative for back pain.  Skin: Negative for color change and rash.  Neurological: Negative for seizures and syncope.  All other systems reviewed and are negative.    Physical Exam Updated Vital Signs BP (!) 139/48   Pulse 86   Temp 98.8 F (37.1 C)   Resp 16   SpO2 97%   Physical Exam  Constitutional: She is oriented to person, place, and time. She appears well-developed and well-nourished. No distress.  HENT:  Head: Normocephalic and atraumatic.  Eyes: Conjunctivae and EOM are normal.  Neck: Normal range of motion.  Cardiovascular: Normal rate, regular  rhythm, normal heart sounds and intact distal pulses.  Exam reveals no gallop and no friction rub.   No murmur heard. Pulmonary/Chest: Effort normal and breath sounds normal. No respiratory distress. She has no wheezes. She has no rales.  Abdominal: Soft. She exhibits no distension. There is no tenderness. There is no guarding.  Musculoskeletal: She exhibits no edema or tenderness.  Bandage clean/dry, no surrounding contusion  Neurological: She is alert and oriented to person, place, and time.  Skin: Skin is warm and dry. No rash noted. She is not diaphoretic. No erythema.  Nursing note and vitals reviewed.    ED Treatments / Results  Labs (all labs ordered are listed, but only abnormal results are displayed) Labs Reviewed  CBC WITH DIFFERENTIAL/PLATELET - Abnormal; Notable for the following:       Result Value   WBC 12.1 (*)    RBC 1.97 (*)    Hemoglobin 5.9 (*)    HCT 18.1 (*)    RDW 20.2 (*)    Neutro Abs 9.1 (*)     Monocytes Absolute 1.1 (*)    All other components within normal limits  COMPREHENSIVE METABOLIC PANEL - Abnormal; Notable for the following:    Glucose, Bld 132 (*)    BUN 27 (*)    Creatinine, Ser 1.15 (*)    Calcium 8.0 (*)    Total Protein 5.5 (*)    Albumin 3.0 (*)    Total Bilirubin 2.2 (*)    GFR calc non Af Amer 43 (*)    GFR calc Af Amer 50 (*)    All other components within normal limits  PROTIME-INR - Abnormal; Notable for the following:    Prothrombin Time 19.8 (*)    All other components within normal limits  TYPE AND SCREEN  ABO/RH  PREPARE RBC (CROSSMATCH)    EKG  EKG Interpretation None       Radiology No results found.  Procedures Procedures (including critical care time)  Medications Ordered in ED Medications  0.9 %  sodium chloride infusion (not administered)  methocarbamol (ROBAXIN) tablet 500 mg (not administered)  oxyCODONE (Oxy IR/ROXICODONE) immediate release tablet 5 mg (not administered)     Initial Impression / Assessment and Plan / ED Course  I have reviewed the triage vital signs and the nursing notes.  Pertinent labs & imaging results that were available during my care of the patient were reviewed by me and considered in my medical decision making (see chart for details).  Clinical Course    83 year old female with a history of anemia, carotid artery stenosis, hypertension, recent total right hip arthroplasty on January 2 on Xarelto for DVT ppx currently at [facility] rehab facility presents with concern for anemia.  Patient with normal vital signs, asymptomatic, denies any source of bleeding, including no vaginal bleeding, no black or bloody stools, no hematemesis. Reports she had similar worsening of her chronic anemia following her knee surgery and required transfusion.  Discussed options with patient including admission versus transfusion in the emergency department and discharge, and patient prefers emergency department transfusion  which I feel is appropriate given post-operative anemia, hemodynamic stability.    Patient signed out to oncoming provider as she receives her transfusion and repeat CBC.  If she develops complications of transfusion she will be admitted, otherwise plan will be to return to [facility] place for continued rehab care.   Final Clinical Impressions(s) / ED Diagnoses   Final diagnoses:  Postoperative anemia    New  Prescriptions New Prescriptions   No medications on file     Gareth Morgan, MD 12/03/16 [phone]

## 2016-12-03 NOTE — Discharge Instructions (Signed)
You were seen today for anemia. You received 2 units of blood and your repeat hemoglobin is reassuring. Continue rehabilitation as you were prior. If you develop dizziness or worsening weakness in the be reevaluated. You need to repeat hemoglobin in one week by her primary physician.

## 2016-12-03 NOTE — ED Notes (Signed)
[facility] notified of transport back to [facility].

## 2016-12-04 DIAGNOSIS — M25551 Pain in right hip: Secondary | ICD-10-CM | POA: Diagnosis not present

## 2016-12-04 DIAGNOSIS — N183 Chronic kidney disease, stage 3 (moderate): Secondary | ICD-10-CM | POA: Diagnosis not present

## 2016-12-04 DIAGNOSIS — Z96641 Presence of right artificial hip joint: Secondary | ICD-10-CM | POA: Diagnosis not present

## 2016-12-04 DIAGNOSIS — R2681 Unsteadiness on feet: Secondary | ICD-10-CM | POA: Diagnosis not present

## 2016-12-04 DIAGNOSIS — M6281 Muscle weakness (generalized): Secondary | ICD-10-CM | POA: Diagnosis not present

## 2016-12-04 LAB — TYPE AND SCREEN
ABO/RH(D): A POS
Antibody Screen: NEGATIVE
UNIT DIVISION: 0
Unit division: 0

## 2016-12-05 ENCOUNTER — Other Ambulatory Visit: Payer: Self-pay | Admitting: *Deleted

## 2016-12-05 ENCOUNTER — Telehealth: Payer: Self-pay | Admitting: *Deleted

## 2016-12-05 DIAGNOSIS — Z96641 Presence of right artificial hip joint: Secondary | ICD-10-CM | POA: Diagnosis not present

## 2016-12-05 DIAGNOSIS — N183 Chronic kidney disease, stage 3 (moderate): Secondary | ICD-10-CM | POA: Diagnosis not present

## 2016-12-05 DIAGNOSIS — M25551 Pain in right hip: Secondary | ICD-10-CM | POA: Diagnosis not present

## 2016-12-05 DIAGNOSIS — M6281 Muscle weakness (generalized): Secondary | ICD-10-CM | POA: Diagnosis not present

## 2016-12-05 DIAGNOSIS — R2681 Unsteadiness on feet: Secondary | ICD-10-CM | POA: Diagnosis not present

## 2016-12-05 LAB — CBC AND DIFFERENTIAL
HCT: 26 % — AB (ref 36–46)
Hemoglobin: 8.6 g/dL — AB (ref 12.0–16.0)
Neutrophils Absolute: 7 /uL
Platelets: 323 10*3/uL (ref 150–399)
WBC: 9.5 10*3/mL

## 2016-12-05 MED ORDER — OXYCODONE HCL 5 MG PO TABS: ORAL_TABLET | ORAL | 0 refills | Status: DC

## 2016-12-05 NOTE — Telephone Encounter (Signed)
Pt was on TCM list admitted for RIGHT HIP OSTEOARTHTRITIS . Pt had total hip arthroplasty, and will be follow-up w/orthopedic  Garald Balding, MD Follow up on 12/09/2016.

## 2016-12-05 NOTE — Telephone Encounter (Signed)
[facility] #[phone] Fax: [phone] 

## 2016-12-06 DIAGNOSIS — R2681 Unsteadiness on feet: Secondary | ICD-10-CM | POA: Diagnosis not present

## 2016-12-06 DIAGNOSIS — N183 Chronic kidney disease, stage 3 (moderate): Secondary | ICD-10-CM | POA: Diagnosis not present

## 2016-12-06 DIAGNOSIS — Z96641 Presence of right artificial hip joint: Secondary | ICD-10-CM | POA: Diagnosis not present

## 2016-12-06 DIAGNOSIS — M25551 Pain in right hip: Secondary | ICD-10-CM | POA: Diagnosis not present

## 2016-12-06 DIAGNOSIS — M6281 Muscle weakness (generalized): Secondary | ICD-10-CM | POA: Diagnosis not present

## 2016-12-09 ENCOUNTER — Ambulatory Visit (INDEPENDENT_AMBULATORY_CARE_PROVIDER_SITE_OTHER): Payer: Medicare Other | Admitting: Orthopaedic Surgery

## 2016-12-09 DIAGNOSIS — N183 Chronic kidney disease, stage 3 (moderate): Secondary | ICD-10-CM | POA: Diagnosis not present

## 2016-12-09 DIAGNOSIS — Z96641 Presence of right artificial hip joint: Secondary | ICD-10-CM | POA: Diagnosis not present

## 2016-12-09 DIAGNOSIS — M25551 Pain in right hip: Secondary | ICD-10-CM | POA: Diagnosis not present

## 2016-12-09 DIAGNOSIS — M6281 Muscle weakness (generalized): Secondary | ICD-10-CM | POA: Diagnosis not present

## 2016-12-09 DIAGNOSIS — R2681 Unsteadiness on feet: Secondary | ICD-10-CM | POA: Diagnosis not present

## 2016-12-09 NOTE — Progress Notes (Signed)
Office Visit Note   Patient: Claudia Lara           Date of Birth: 08/06/1932           MRN: [id number] Visit Date: 12/09/2016              Requested by: Janith Lima, MD [address] [place], [place] [address] PCP: [name], MD   Assessment & Plan: Visit Diagnoses:  1. History of right hip replacement     Plan:Weight-bear as tolerated right lower extremity with walker.. Continue xarelto for 2 weeks . Home health physical therapy when discharged from [facility]. Office 2 weeks. We will obtain films of her hip at that time as a computer was nonfunctional and x-rays were not possible today  Orders:  No orders of the defined types were placed in this encounter.  No orders of the defined types were placed in this encounter.     Procedures: No procedures performed   Clinical Data: No additional findings.   Subjective: No chief complaint on file.   HPI Claudia Lara is 2 weeks status post right total hip replacement she is presently residing at [facility]. She desperately would like to return to her own home when she is stable and independent. She lives by herself. She relates that she had 2 units of transfused blood at [facility] last week when her hemoglobin was apparently "5". Her hemoglobin was somewhere between 7.5 and 8 at the time of discharge but she was asymptomatic, and therefore, blood was not transfused. Vital signs remained stable .  She is not experiencing much pain. It's a little too early to determine whether or not she's had any significant difference in her pain from her preoperative status Review of Systems   Objective: Vital Signs: There were no vitals taken for this visit.  Physical Exam  Ortho Exam right hip incision is healing nicely. Surgical clips will be removed and Steri-Strips applied. No swelling distally. Neurovascular exam is intact. Leg lengths appeared to be symmetrical.  Specialty Comments:  No specialty comments  available.  Imaging: No results found.   PMFS History: Patient Active Problem List   Diagnosis Date Noted  . Unilateral primary osteoarthritis, right hip 11/26/2016  . S/P total hip arthroplasty 11/26/2016  . GERD (gastroesophageal reflux disease) 08/03/2015  . Hemolytic anemia ([place]) 07/12/2015  . Chronic venous insufficiency 08/10/2014  . Aortic stenosis, mild 06/29/2014  . Left ventricular diastolic dysfunction, NYHA class 1 06/29/2014  . Senile osteopenia 04/21/2014  . Hyperlipidemia with target LDL less than 130 04/21/2014  . Kidney disease, chronic, stage III (GFR 30-59 ml/min) 04/21/2014  . Routine health maintenance 07/16/2012  . Carotid artery stenosis without cerebral infarction 01/16/2010  . GEN OSTEOARTHROSIS INVOLVING MULTIPLE SITES 11/11/2008  . Essential hypertension 10/19/2007   Past Medical History:  Diagnosis Date  . ANEMIA 08/22/2009  . CAROTID ARTERY STENOSIS 01/16/2010  . Complication of anesthesia    very hard to wake up from surgery  . DYSPNEA ON EXERTION 11/16/2007  . Gallstones   . GEN OSTEOARTHROSIS INVOLVING MULTIPLE SITES 11/11/2008  . GERD (gastroesophageal reflux disease)   . HEMORRHOIDS, INTERNAL    surgery was in the 90's (per patient)  . NECK PAIN, ACUTE 12/28/2009  . PERIPHERAL EDEMA 10/06/2007  . PVD 10/06/2007  . Unspecified essential hypertension 10/19/2007    Family History  Problem Relation Age of Onset  . Ovarian cancer Mother   . Cancer Mother   .  Leukemia Father   . Lung cancer Father   . Cancer Father   . Cancer Brother   . Diabetes Neg Hx   . Heart disease Neg Hx   . Hypertension Neg Hx     Past Surgical History:  Procedure Laterality Date  . ABDOMINAL HYSTERECTOMY    . BUNIONECTOMY     bilat  . CHOLECYSTECTOMY  01/29/2016   at [facility]  . ENDOSCOPIC RETROGRADE CHOLANGIOPANCREATOGRAPHY (ERCP) WITH PROPOFOL N/A 12/01/2015   Procedure: ENDOSCOPIC RETROGRADE CHOLANGIOPANCREATOGRAPHY (ERCP) WITH PROPOFOL;   Surgeon: [name], MD;  Location: [facility];  Service: Endoscopy;  Laterality: N/A;  . ENDOSCOPIC RETROGRADE CHOLANGIOPANCREATOGRAPHY (ERCP) WITH PROPOFOL N/A 02/01/2016   Procedure: ENDOSCOPIC RETROGRADE CHOLANGIOPANCREATOGRAPHY (ERCP) WITH PROPOFOL;  Surgeon: [name], MD;  Location: [facility];  Service: Endoscopy;  Laterality: N/A;  . Hyperplastic colon polyps, removed  2007   By Dr. Penelope Coop  . JOINT REPLACEMENT  2011   right knee  . OOPHORECTOMY    . right hip replacement    . ROTATOR CUFF REPAIR  2004   left (Dr. Durward Fortes)  . TOTAL HIP ARTHROPLASTY Right 11/26/2016   Procedure: RIGHT TOTAL HIP ARTHROPLASTY;  Surgeon: Garald Balding, MD;  Location: [place] [facility];  Service: Orthopedics;  Laterality: Right;  . TOTAL KNEE ARTHROPLASTY Right    Social History   Occupational History  . Network engineer Retired    retired   Social History Main Topics  . Smoking status: Never Smoker  . Smokeless tobacco: Never Used  . Alcohol use No  . Drug use: No  . Sexual activity: Not Currently

## 2016-12-13 LAB — CBC AND DIFFERENTIAL
HEMATOCRIT: 25 % — AB (ref 36–46)
Hemoglobin: 8.3 g/dL — AB (ref 12.0–16.0)
NEUTROS ABS: 4 /uL
PLATELETS: 308 10*3/uL (ref 150–399)
WBC: 5 10^3/mL

## 2016-12-16 ENCOUNTER — Non-Acute Institutional Stay (SKILLED_NURSING_FACILITY): Payer: Medicare Other | Admitting: Adult Health

## 2016-12-16 ENCOUNTER — Encounter: Payer: Self-pay | Admitting: Adult Health

## 2016-12-16 DIAGNOSIS — K5900 Constipation, unspecified: Secondary | ICD-10-CM

## 2016-12-16 DIAGNOSIS — I10 Essential (primary) hypertension: Secondary | ICD-10-CM | POA: Diagnosis not present

## 2016-12-16 DIAGNOSIS — K219 Gastro-esophageal reflux disease without esophagitis: Secondary | ICD-10-CM

## 2016-12-16 DIAGNOSIS — D62 Acute posthemorrhagic anemia: Secondary | ICD-10-CM | POA: Diagnosis not present

## 2016-12-16 DIAGNOSIS — M1611 Unilateral primary osteoarthritis, right hip: Secondary | ICD-10-CM

## 2016-12-16 DIAGNOSIS — R2681 Unsteadiness on feet: Secondary | ICD-10-CM | POA: Diagnosis not present

## 2016-12-16 NOTE — Progress Notes (Signed)
DATE:  12/16/2016   MRN:  [id number]  BIRTHDAY: [birth year]  Facility:  Nursing Home Location:  [facility] [facility] Room Number: [id number]  LEVEL OF CARE:  SNF [phone])  Contact Information    Name Relation Home Work Mobile   Tesh,Sharon Daughter [phone]  [phone]       Code Status History    Date Active Date Inactive Code Status Order ID Comments User Context   11/26/2016 12:53 PM 11/28/2016  5:36 PM Full Code [id number]  Cherylann Ratel, PA-C Inpatient    Advance Directive Documentation   Flowsheet Row Most Recent Value  Type of Advance Directive  Out of facility DNR (pink MOST or yellow form)  Pre-existing out of facility DNR order (yellow form or pink MOST form)  No data  "MOST" Form in Place?  No data       Chief Complaint  Patient presents with  . Discharge Note    HISTORY OF PRESENT ILLNESS:  This is an 45-YO female who is for discharge home with Home health PT, OT, CNA, and Nursing services.    She has been admitted to [facility] on 11/28/16 from [facility] admission dates 11/26/16 thru 11/28/16 with right hip osteoarthritis and had right total hip arthroplasty. She had post-op anemia (hgb 5.8) and was transfused 2 units PRBC. Latst hgb is  8.3.  Patient was admitted to this facility for short-term rehabilitation after the patient's recent hospitalization.  Patient has completed SNF rehabilitation and therapy has cleared the patient for discharge.   PAST MEDICAL HISTORY:  Past Medical History:  Diagnosis Date  . ANEMIA 08/22/2009  . CAROTID ARTERY STENOSIS 01/16/2010  . Complication of anesthesia    very hard to wake up from surgery  . DYSPNEA ON EXERTION 11/16/2007  . Gallstones   . GEN OSTEOARTHROSIS INVOLVING MULTIPLE SITES 11/11/2008  . GERD (gastroesophageal reflux disease)   . HEMORRHOIDS, INTERNAL    surgery was in the 90's (per patient)  . NECK PAIN, ACUTE 12/28/2009  . PERIPHERAL EDEMA 10/06/2007   . PVD 10/06/2007  . Unspecified essential hypertension 10/19/2007     CURRENT MEDICATIONS: Reviewed  Patient's Medications  New Prescriptions   No medications on file  Previous Medications   ACETAMINOPHEN (TYLENOL) 325 MG TABLET    Take 650 mg by mouth every morning.   ARTIFICIAL TEAR OINTMENT (DRY EYES OP)    Place 1 drop into both eyes 3 (three) times daily as needed.   CALCIUM CARBONATE-VIT D-MIN (CALCIUM 600 + MINERALS) 600-200 MG-UNIT TABS    Take 1 tablet by mouth every evening.    CARVEDILOL (COREG) 12.5 MG TABLET    TAKE 1 TABLET BY MOUTH 2 TIMES DAILY WITH A MEAL.   CHOLECALCIFEROL (VITAMIN D) 1000 UNITS TABLET    Take 1,000 Units by mouth daily.   METHOCARBAMOL (ROBAXIN) 500 MG TABLET    Take 1 tablet (500 mg total) by mouth every 6 (six) hours as needed for muscle spasms.   MULTIVITAMIN-IRON-MINERALS-FOLIC ACID (CENTRUM) CHEWABLE TABLET    Chew 1 tablet by mouth daily. Has stopped prior to procedure   OXYCODONE (OXY-IR) 5 MG CAPSULE    Take 5 mg by mouth every 4 (four) hours as needed for pain.   PANTOPRAZOLE (PROTONIX) 40 MG TABLET    TAKE 1 TABLET (40 MG TOTAL) BY MOUTH DAILY.   POLYETHYLENE GLYCOL (MIRALAX / GLYCOLAX) PACKET    Take 17 g by mouth daily as  needed for mild constipation.   RIVAROXABAN (XARELTO) 10 MG TABS TABLET    Take 1 tablet (10 mg total) by mouth daily with breakfast.   SENNOSIDES-DOCUSATE SODIUM (SENOKOT-S) 8.6-50 MG TABLET    Take 2 tablets by mouth at bedtime.   VITAMIN C (ASCORBIC ACID) 500 MG TABLET    Take 500 mg by mouth daily.  Modified Medications   No medications on file  Discontinued Medications   OXYCODONE (OXY IR/ROXICODONE) 5 MG IMMEDIATE RELEASE TABLET    Take one tablet by mouth twice daily at 6am and 10am; Take one tablet by mouth every 4 hours as needed for severe pain. Hold for sedation     Allergies  Allergen Reactions  . Sulfonamide Derivatives Rash    Childhood reaction     REVIEW OF SYSTEMS:  GENERAL: no change in  appetite, no fatigue, no weight changes, no fever, chills or weakness EYES: Denies change in vision, dry eyes, eye pain, itching or discharge EARS: Denies change in hearing, ringing in ears, or earache NOSE: Denies nasal congestion or epistaxis MOUTH and THROAT: Denies oral discomfort, gingival pain or bleeding, pain from teeth or hoarseness   RESPIRATORY: no cough, SOB, DOE, wheezing, hemoptysis CARDIAC: no chest pain, edema or palpitations GI: no abdominal pain, diarrhea, constipation, heart burn, nausea or vomiting GU: Denies dysuria, frequency, hematuria, incontinence, or discharge PSYCHIATRIC: Denies feeling of depression or anxiety. No report of hallucinations, insomnia, paranoia, or agitation    PHYSICAL EXAMINATION  GENERAL APPEARANCE: Well nourished. In no acute distress. Normal body habitus SKIN:  Right hip surgical incision has steri-strips and dry dressing, no redness  HEAD: Normal in size and contour. No evidence of trauma EYES: Lids open and close normally. No blepharitis, entropion or ectropion. PERRL. Conjunctivae are clear and sclerae are white. Lenses are without opacity EARS: Pinnae are normal. Patient hears normal voice tunes of the examiner MOUTH and THROAT: Lips are without lesions. Oral mucosa is moist and without lesions. Tongue is normal in shape, size, and color and without lesions NECK: supple, trachea midline, no neck masses, no thyroid tenderness, no thyromegaly LYMPHATICS: no LAN in the neck, no supraclavicular LAN RESPIRATORY: breathing is even & unlabored, BS CTAB CARDIAC: RRR, no murmur,no extra heart sounds, RLE 2+ edema and LLE 1+ edema GI: abdomen soft, normal BS, no masses, no tenderness, no hepatomegaly, no splenomegaly EXTREMITIES:  Able to move X 4 extremities, walks with walker PSYCHIATRIC: Alert and oriented X 3. Affect and behavior are appropriate   LABS/RADIOLOGY: Labs reviewed: Basic Metabolic Panel:  Recent Labs  11/27/16 0648  11/28/16 0528 12/02/16 12/02/16 2220  NA 138 138 142 139  K 3.7 3.9 3.7 3.7  CL 107 108  --  109  CO2 25 24  --  24  GLUCOSE 128* 144*  --  132*  BUN 20 27* 24* 27*  CREATININE 1.10* 1.34* 1.0 1.15*  CALCIUM 8.3* 8.2*  --  8.0*   Liver Function Tests:  Recent Labs  07/09/16 1136 11/13/16 1131 12/02/16 2220  AST 18 23 21   ALT 12 16 19   ALKPHOS 75 78 58  BILITOT 2.0* 3.0* 2.2*  PROT 6.6 6.2* 5.5*  ALBUMIN 4.4 4.2 3.0*   CBC:  Recent Labs  11/27/16 0648 11/28/16 0528  12/02/16 2220 12/03/16 0715 12/05/16 12/13/16  WBC 8.3 12.5*  < > 12.1*  --  9.5 5.0  NEUTROABS  --   --   --  9.1*  --  7 4  HGB 7.6*  7.1*  < > 5.9* 8.2* 8.6* 8.3*  HCT 22.0* 20.9*  < > 18.1* 24.9* 26* 25*  MCV 89.8 90.9  --  91.9  --   --   --   PLT 182 212  < > 307  --  323 308  < > = values in this interval not displayed. Lipid Panel:  Recent Labs  07/09/16 1136  HDL 62.60     Dg Hip Port Unilat With Pelvis 1v Right  Result Date: 11/26/2016 CLINICAL DATA:  Postop right hip replacement EXAM: DG HIP (WITH OR WITHOUT PELVIS) 1V PORT RIGHT COMPARISON:  None. FINDINGS: Right total hip arthroplasty without failure or complication. No acute fracture or dislocation. Mild osteoarthritis of the left hip. IMPRESSION: Right total hip arthroplasty. Electronically Signed   By: Kathreen Devoid   On: 11/26/2016 10:28    ASSESSMENT/PLAN:   Unsteady gait - for home health PT and OT, for therapeutic strengthening exercises; fall precautions   Right hip osteoarthritis -  S/P right total hip arthroplasty, follow-up with orthopedics, continue Xarelto 10 mg 1 tab by mouth daily  2 weeks for DVT prophylaxis; oxycodone 5 mg 1 tab by mouth every 4 hours when necessary for pain, Robaxin 500 mg 1 tab by mouth every 6 hours when necessary for muscle spasm  Anemia, acute blood loss - S/P transfusion of 2 units PRBC. hgb 8.3, follow-up with PCP  Essential hypertension - well-controlled; continue Coreg 12.5 mg 1 tab by  mouth twice a day  Constipation - continue MiraLAX 17 g by mouth daily and senna S 2 tabs by mouth daily at bedtime  GERD - continueProtonix 40 mg 1 tab by mouth daily     I have filled out patient's discharge paperwork and written prescriptions.  Patient will receive home health PT, OT, Nursing and CNA.  DME provided:  None  Total discharge time: Less than 30 minutes  Discharge time involved coordination of the discharge process with social worker, nursing staff and therapy department. Medical justification for home health services verified.    Artemus Romanoff C. [place] - NP    [employer] [phone]

## 2016-12-17 DIAGNOSIS — Z96641 Presence of right artificial hip joint: Secondary | ICD-10-CM | POA: Diagnosis not present

## 2016-12-17 DIAGNOSIS — M25551 Pain in right hip: Secondary | ICD-10-CM | POA: Diagnosis not present

## 2016-12-17 DIAGNOSIS — M6281 Muscle weakness (generalized): Secondary | ICD-10-CM | POA: Diagnosis not present

## 2016-12-17 DIAGNOSIS — N183 Chronic kidney disease, stage 3 (moderate): Secondary | ICD-10-CM | POA: Diagnosis not present

## 2016-12-17 DIAGNOSIS — R2681 Unsteadiness on feet: Secondary | ICD-10-CM | POA: Diagnosis not present

## 2016-12-18 ENCOUNTER — Telehealth (INDEPENDENT_AMBULATORY_CARE_PROVIDER_SITE_OTHER): Payer: Self-pay | Admitting: Orthopaedic Surgery

## 2016-12-18 NOTE — Telephone Encounter (Signed)
Patient would like to know how much longer she should be wearing the TED stockings. Please call patient

## 2016-12-18 NOTE — Telephone Encounter (Signed)
Per BP, pt can quit wearing TED hose

## 2016-12-19 DIAGNOSIS — Z96641 Presence of right artificial hip joint: Secondary | ICD-10-CM | POA: Diagnosis not present

## 2016-12-19 DIAGNOSIS — M8580 Other specified disorders of bone density and structure, unspecified site: Secondary | ICD-10-CM | POA: Diagnosis not present

## 2016-12-19 DIAGNOSIS — Z96653 Presence of artificial knee joint, bilateral: Secondary | ICD-10-CM | POA: Diagnosis not present

## 2016-12-19 DIAGNOSIS — K219 Gastro-esophageal reflux disease without esophagitis: Secondary | ICD-10-CM | POA: Diagnosis not present

## 2016-12-19 DIAGNOSIS — I129 Hypertensive chronic kidney disease with stage 1 through stage 4 chronic kidney disease, or unspecified chronic kidney disease: Secondary | ICD-10-CM | POA: Diagnosis not present

## 2016-12-19 DIAGNOSIS — Z471 Aftercare following joint replacement surgery: Secondary | ICD-10-CM | POA: Diagnosis not present

## 2016-12-19 DIAGNOSIS — N183 Chronic kidney disease, stage 3 (moderate): Secondary | ICD-10-CM | POA: Diagnosis not present

## 2016-12-19 DIAGNOSIS — I739 Peripheral vascular disease, unspecified: Secondary | ICD-10-CM | POA: Diagnosis not present

## 2016-12-19 DIAGNOSIS — E785 Hyperlipidemia, unspecified: Secondary | ICD-10-CM | POA: Diagnosis not present

## 2016-12-19 DIAGNOSIS — Z79891 Long term (current) use of opiate analgesic: Secondary | ICD-10-CM | POA: Diagnosis not present

## 2016-12-19 DIAGNOSIS — I872 Venous insufficiency (chronic) (peripheral): Secondary | ICD-10-CM | POA: Diagnosis not present

## 2016-12-19 DIAGNOSIS — M150 Primary generalized (osteo)arthritis: Secondary | ICD-10-CM | POA: Diagnosis not present

## 2016-12-20 ENCOUNTER — Telehealth (INDEPENDENT_AMBULATORY_CARE_PROVIDER_SITE_OTHER): Payer: Self-pay | Admitting: Orthopaedic Surgery

## 2016-12-20 DIAGNOSIS — Z471 Aftercare following joint replacement surgery: Secondary | ICD-10-CM | POA: Diagnosis not present

## 2016-12-20 DIAGNOSIS — M150 Primary generalized (osteo)arthritis: Secondary | ICD-10-CM | POA: Diagnosis not present

## 2016-12-20 DIAGNOSIS — K219 Gastro-esophageal reflux disease without esophagitis: Secondary | ICD-10-CM | POA: Diagnosis not present

## 2016-12-20 DIAGNOSIS — I872 Venous insufficiency (chronic) (peripheral): Secondary | ICD-10-CM | POA: Diagnosis not present

## 2016-12-20 DIAGNOSIS — I129 Hypertensive chronic kidney disease with stage 1 through stage 4 chronic kidney disease, or unspecified chronic kidney disease: Secondary | ICD-10-CM | POA: Diagnosis not present

## 2016-12-20 DIAGNOSIS — N183 Chronic kidney disease, stage 3 (moderate): Secondary | ICD-10-CM | POA: Diagnosis not present

## 2016-12-20 DIAGNOSIS — E785 Hyperlipidemia, unspecified: Secondary | ICD-10-CM | POA: Diagnosis not present

## 2016-12-20 DIAGNOSIS — M8580 Other specified disorders of bone density and structure, unspecified site: Secondary | ICD-10-CM | POA: Diagnosis not present

## 2016-12-20 DIAGNOSIS — Z96653 Presence of artificial knee joint, bilateral: Secondary | ICD-10-CM | POA: Diagnosis not present

## 2016-12-20 DIAGNOSIS — I739 Peripheral vascular disease, unspecified: Secondary | ICD-10-CM | POA: Diagnosis not present

## 2016-12-20 DIAGNOSIS — Z96641 Presence of right artificial hip joint: Secondary | ICD-10-CM | POA: Diagnosis not present

## 2016-12-20 DIAGNOSIS — Z79891 Long term (current) use of opiate analgesic: Secondary | ICD-10-CM | POA: Diagnosis not present

## 2016-12-20 NOTE — Telephone Encounter (Signed)
Called with OK

## 2016-12-20 NOTE — Telephone Encounter (Signed)
Claudia Lara, a physical therapist with [facility] is requesting a verbal physical therapy order for the following:  1 week 1  3 week 2 2 week 2   Cb# [phone]

## 2016-12-23 ENCOUNTER — Ambulatory Visit (INDEPENDENT_AMBULATORY_CARE_PROVIDER_SITE_OTHER): Payer: Medicare Other | Admitting: Orthopaedic Surgery

## 2016-12-23 ENCOUNTER — Encounter (INDEPENDENT_AMBULATORY_CARE_PROVIDER_SITE_OTHER): Payer: Self-pay | Admitting: Orthopaedic Surgery

## 2016-12-23 ENCOUNTER — Ambulatory Visit (INDEPENDENT_AMBULATORY_CARE_PROVIDER_SITE_OTHER): Payer: Medicare Other

## 2016-12-23 VITALS — BP 132/51 | HR 81 | Ht 63.5 in | Wt 167.0 lb

## 2016-12-23 DIAGNOSIS — M150 Primary generalized (osteo)arthritis: Secondary | ICD-10-CM | POA: Diagnosis not present

## 2016-12-23 DIAGNOSIS — I129 Hypertensive chronic kidney disease with stage 1 through stage 4 chronic kidney disease, or unspecified chronic kidney disease: Secondary | ICD-10-CM | POA: Diagnosis not present

## 2016-12-23 DIAGNOSIS — Z79891 Long term (current) use of opiate analgesic: Secondary | ICD-10-CM | POA: Diagnosis not present

## 2016-12-23 DIAGNOSIS — K219 Gastro-esophageal reflux disease without esophagitis: Secondary | ICD-10-CM | POA: Diagnosis not present

## 2016-12-23 DIAGNOSIS — I739 Peripheral vascular disease, unspecified: Secondary | ICD-10-CM | POA: Diagnosis not present

## 2016-12-23 DIAGNOSIS — Z471 Aftercare following joint replacement surgery: Secondary | ICD-10-CM | POA: Diagnosis not present

## 2016-12-23 DIAGNOSIS — I872 Venous insufficiency (chronic) (peripheral): Secondary | ICD-10-CM | POA: Diagnosis not present

## 2016-12-23 DIAGNOSIS — M8580 Other specified disorders of bone density and structure, unspecified site: Secondary | ICD-10-CM | POA: Diagnosis not present

## 2016-12-23 DIAGNOSIS — Z96641 Presence of right artificial hip joint: Secondary | ICD-10-CM

## 2016-12-23 DIAGNOSIS — Z96653 Presence of artificial knee joint, bilateral: Secondary | ICD-10-CM | POA: Diagnosis not present

## 2016-12-23 DIAGNOSIS — E785 Hyperlipidemia, unspecified: Secondary | ICD-10-CM | POA: Diagnosis not present

## 2016-12-23 DIAGNOSIS — N183 Chronic kidney disease, stage 3 (moderate): Secondary | ICD-10-CM | POA: Diagnosis not present

## 2016-12-23 NOTE — Progress Notes (Signed)
Office Visit Note   Patient: Claudia Lara           Date of Birth: [birth year]           MRN: [id number] Visit Date: 12/23/2016              Requested by: Janith Lima, MD [address] [place], [place] [address] PCP: [name], MD   Assessment & Plan: Visit Diagnoses: 1 month status post primary right total hip replacement.She has been home since last Wednesday from [facility]. Using a walker. Tramadol for pain  Plan: Progress from walker to cane. Continue with exercises. Stop xarelto and may resume meloxicam. Follow-up in one month.  Follow-Up Instructions: No Follow-up on file.   Orders:  No orders of the defined types were placed in this encounter.  No orders of the defined types were placed in this encounter.     Procedures: No procedures performed   Clinical Data: No additional findings.   Subjective: No chief complaint on file.   Pt is 4 weeks status post Right Total hip replacement on 11/26/16.  Pt states her PT went well today and she is back at home. Pt ambulates with a walker, weightbearing as tolerated.   No related chest pain or shortness of breath. No calf pain. No distal edema. Having some thigh pain and knee discomfort. Prior history of right total knee replacement. Significantly better since she had her surgery in terms of her pain. Review of Systems   Objective: Vital Signs: There were no vitals taken for this visit.  Physical Exam  Ortho Exam independent in ambulation with walker.incision healed very nicely without problem. Neurovascular exam intact. No distal edema. Leg lengths appear to be symmetrical.  Specialty Comments:  No specialty comments available.  Imaging: No results found.   PMFS History: Patient Active Problem List   Diagnosis Date Noted  . Unilateral primary osteoarthritis, right hip 11/26/2016  . S/P total hip arthroplasty 11/26/2016  . GERD (gastroesophageal reflux disease) 08/03/2015  . Hemolytic  anemia ([place]) 07/12/2015  . Chronic venous insufficiency 08/10/2014  . Aortic stenosis, mild 06/29/2014  . Left ventricular diastolic dysfunction, NYHA class 1 06/29/2014  . Senile osteopenia 04/21/2014  . Hyperlipidemia with target LDL less than 130 04/21/2014  . Kidney disease, chronic, stage III (GFR 30-59 ml/min) 04/21/2014  . Routine health maintenance 07/16/2012  . Carotid artery stenosis without cerebral infarction 01/16/2010  . GEN OSTEOARTHROSIS INVOLVING MULTIPLE SITES 11/11/2008  . Essential hypertension 10/19/2007   Past Medical History:  Diagnosis Date  . ANEMIA 08/22/2009  . CAROTID ARTERY STENOSIS 01/16/2010  . Complication of anesthesia    very hard to wake up from surgery  . DYSPNEA ON EXERTION 11/16/2007  . Gallstones   . GEN OSTEOARTHROSIS INVOLVING MULTIPLE SITES 11/11/2008  . GERD (gastroesophageal reflux disease)   . HEMORRHOIDS, INTERNAL    surgery was in the 90's (per patient)  . NECK PAIN, ACUTE 12/28/2009  . PERIPHERAL EDEMA 10/06/2007  . PVD 10/06/2007  . Unspecified essential hypertension 10/19/2007    Family History  Problem Relation Age of Onset  . Ovarian cancer Mother   . Cancer Mother   . Leukemia Father   . Lung cancer Father   . Cancer Father   . Cancer Brother   . Diabetes Neg Hx   . Heart disease Neg Hx   . Hypertension Neg Hx     Past Surgical History:  Procedure Laterality Date  . ABDOMINAL  HYSTERECTOMY    . BUNIONECTOMY     bilat  . CHOLECYSTECTOMY  01/29/2016   at [facility]  . ENDOSCOPIC RETROGRADE CHOLANGIOPANCREATOGRAPHY (ERCP) WITH PROPOFOL N/A 12/01/2015   Procedure: ENDOSCOPIC RETROGRADE CHOLANGIOPANCREATOGRAPHY (ERCP) WITH PROPOFOL;  Surgeon: [name], MD;  Location: [facility] ENDOSCOPY;  Service: Endoscopy;  Laterality: N/A;  . ENDOSCOPIC RETROGRADE CHOLANGIOPANCREATOGRAPHY (ERCP) WITH PROPOFOL N/A 02/01/2016   Procedure: ENDOSCOPIC RETROGRADE CHOLANGIOPANCREATOGRAPHY (ERCP) WITH PROPOFOL;  Surgeon: [name], MD;  Location: [facility] ENDOSCOPY;  Service: Endoscopy;  Laterality: N/A;  . Hyperplastic colon polyps, removed  2007   By Dr. Penelope Coop  . JOINT REPLACEMENT  2011   right knee  . OOPHORECTOMY    . right hip replacement    . ROTATOR CUFF REPAIR  2004   left (Dr. Durward Fortes)  . TOTAL HIP ARTHROPLASTY Right 11/26/2016   Procedure: RIGHT TOTAL HIP ARTHROPLASTY;  Surgeon: Garald Balding, MD;  Location: [facility];  Service: Orthopedics;  Laterality: Right;  . TOTAL KNEE ARTHROPLASTY Right    Social History   Occupational History  . Network engineer Retired    retired   Social History Main Topics  . Smoking status: Never Smoker  . Smokeless tobacco: Never Used  . Alcohol use No  . Drug use: No  . Sexual activity: Not Currently

## 2016-12-24 DIAGNOSIS — Z79891 Long term (current) use of opiate analgesic: Secondary | ICD-10-CM | POA: Diagnosis not present

## 2016-12-24 DIAGNOSIS — M8580 Other specified disorders of bone density and structure, unspecified site: Secondary | ICD-10-CM | POA: Diagnosis not present

## 2016-12-24 DIAGNOSIS — Z96653 Presence of artificial knee joint, bilateral: Secondary | ICD-10-CM | POA: Diagnosis not present

## 2016-12-24 DIAGNOSIS — I739 Peripheral vascular disease, unspecified: Secondary | ICD-10-CM | POA: Diagnosis not present

## 2016-12-24 DIAGNOSIS — M150 Primary generalized (osteo)arthritis: Secondary | ICD-10-CM | POA: Diagnosis not present

## 2016-12-24 DIAGNOSIS — Z471 Aftercare following joint replacement surgery: Secondary | ICD-10-CM | POA: Diagnosis not present

## 2016-12-24 DIAGNOSIS — E785 Hyperlipidemia, unspecified: Secondary | ICD-10-CM | POA: Diagnosis not present

## 2016-12-24 DIAGNOSIS — I872 Venous insufficiency (chronic) (peripheral): Secondary | ICD-10-CM | POA: Diagnosis not present

## 2016-12-24 DIAGNOSIS — I129 Hypertensive chronic kidney disease with stage 1 through stage 4 chronic kidney disease, or unspecified chronic kidney disease: Secondary | ICD-10-CM | POA: Diagnosis not present

## 2016-12-24 DIAGNOSIS — N183 Chronic kidney disease, stage 3 (moderate): Secondary | ICD-10-CM | POA: Diagnosis not present

## 2016-12-24 DIAGNOSIS — K219 Gastro-esophageal reflux disease without esophagitis: Secondary | ICD-10-CM | POA: Diagnosis not present

## 2016-12-24 DIAGNOSIS — Z96641 Presence of right artificial hip joint: Secondary | ICD-10-CM | POA: Diagnosis not present

## 2016-12-25 DIAGNOSIS — E785 Hyperlipidemia, unspecified: Secondary | ICD-10-CM | POA: Diagnosis not present

## 2016-12-25 DIAGNOSIS — Z96653 Presence of artificial knee joint, bilateral: Secondary | ICD-10-CM | POA: Diagnosis not present

## 2016-12-25 DIAGNOSIS — Z79891 Long term (current) use of opiate analgesic: Secondary | ICD-10-CM | POA: Diagnosis not present

## 2016-12-25 DIAGNOSIS — Z96641 Presence of right artificial hip joint: Secondary | ICD-10-CM | POA: Diagnosis not present

## 2016-12-25 DIAGNOSIS — N183 Chronic kidney disease, stage 3 (moderate): Secondary | ICD-10-CM | POA: Diagnosis not present

## 2016-12-25 DIAGNOSIS — K219 Gastro-esophageal reflux disease without esophagitis: Secondary | ICD-10-CM | POA: Diagnosis not present

## 2016-12-25 DIAGNOSIS — I739 Peripheral vascular disease, unspecified: Secondary | ICD-10-CM | POA: Diagnosis not present

## 2016-12-25 DIAGNOSIS — Z471 Aftercare following joint replacement surgery: Secondary | ICD-10-CM | POA: Diagnosis not present

## 2016-12-25 DIAGNOSIS — M150 Primary generalized (osteo)arthritis: Secondary | ICD-10-CM | POA: Diagnosis not present

## 2016-12-25 DIAGNOSIS — M8580 Other specified disorders of bone density and structure, unspecified site: Secondary | ICD-10-CM | POA: Diagnosis not present

## 2016-12-25 DIAGNOSIS — I129 Hypertensive chronic kidney disease with stage 1 through stage 4 chronic kidney disease, or unspecified chronic kidney disease: Secondary | ICD-10-CM | POA: Diagnosis not present

## 2016-12-25 DIAGNOSIS — I872 Venous insufficiency (chronic) (peripheral): Secondary | ICD-10-CM | POA: Diagnosis not present

## 2016-12-26 DIAGNOSIS — N183 Chronic kidney disease, stage 3 (moderate): Secondary | ICD-10-CM | POA: Diagnosis not present

## 2016-12-26 DIAGNOSIS — M8580 Other specified disorders of bone density and structure, unspecified site: Secondary | ICD-10-CM | POA: Diagnosis not present

## 2016-12-26 DIAGNOSIS — M150 Primary generalized (osteo)arthritis: Secondary | ICD-10-CM | POA: Diagnosis not present

## 2016-12-26 DIAGNOSIS — Z96641 Presence of right artificial hip joint: Secondary | ICD-10-CM | POA: Diagnosis not present

## 2016-12-26 DIAGNOSIS — Z96653 Presence of artificial knee joint, bilateral: Secondary | ICD-10-CM | POA: Diagnosis not present

## 2016-12-26 DIAGNOSIS — E785 Hyperlipidemia, unspecified: Secondary | ICD-10-CM | POA: Diagnosis not present

## 2016-12-26 DIAGNOSIS — K219 Gastro-esophageal reflux disease without esophagitis: Secondary | ICD-10-CM | POA: Diagnosis not present

## 2016-12-26 DIAGNOSIS — I872 Venous insufficiency (chronic) (peripheral): Secondary | ICD-10-CM | POA: Diagnosis not present

## 2016-12-26 DIAGNOSIS — Z471 Aftercare following joint replacement surgery: Secondary | ICD-10-CM | POA: Diagnosis not present

## 2016-12-26 DIAGNOSIS — I739 Peripheral vascular disease, unspecified: Secondary | ICD-10-CM | POA: Diagnosis not present

## 2016-12-26 DIAGNOSIS — I129 Hypertensive chronic kidney disease with stage 1 through stage 4 chronic kidney disease, or unspecified chronic kidney disease: Secondary | ICD-10-CM | POA: Diagnosis not present

## 2016-12-26 DIAGNOSIS — Z79891 Long term (current) use of opiate analgesic: Secondary | ICD-10-CM | POA: Diagnosis not present

## 2016-12-27 DIAGNOSIS — Z96653 Presence of artificial knee joint, bilateral: Secondary | ICD-10-CM | POA: Diagnosis not present

## 2016-12-27 DIAGNOSIS — I872 Venous insufficiency (chronic) (peripheral): Secondary | ICD-10-CM | POA: Diagnosis not present

## 2016-12-27 DIAGNOSIS — I129 Hypertensive chronic kidney disease with stage 1 through stage 4 chronic kidney disease, or unspecified chronic kidney disease: Secondary | ICD-10-CM | POA: Diagnosis not present

## 2016-12-27 DIAGNOSIS — K219 Gastro-esophageal reflux disease without esophagitis: Secondary | ICD-10-CM | POA: Diagnosis not present

## 2016-12-27 DIAGNOSIS — M150 Primary generalized (osteo)arthritis: Secondary | ICD-10-CM | POA: Diagnosis not present

## 2016-12-27 DIAGNOSIS — Z96641 Presence of right artificial hip joint: Secondary | ICD-10-CM | POA: Diagnosis not present

## 2016-12-27 DIAGNOSIS — I739 Peripheral vascular disease, unspecified: Secondary | ICD-10-CM | POA: Diagnosis not present

## 2016-12-27 DIAGNOSIS — N183 Chronic kidney disease, stage 3 (moderate): Secondary | ICD-10-CM | POA: Diagnosis not present

## 2016-12-27 DIAGNOSIS — Z471 Aftercare following joint replacement surgery: Secondary | ICD-10-CM | POA: Diagnosis not present

## 2016-12-27 DIAGNOSIS — M8580 Other specified disorders of bone density and structure, unspecified site: Secondary | ICD-10-CM | POA: Diagnosis not present

## 2016-12-27 DIAGNOSIS — Z79891 Long term (current) use of opiate analgesic: Secondary | ICD-10-CM | POA: Diagnosis not present

## 2016-12-27 DIAGNOSIS — E785 Hyperlipidemia, unspecified: Secondary | ICD-10-CM | POA: Diagnosis not present

## 2016-12-30 DIAGNOSIS — M150 Primary generalized (osteo)arthritis: Secondary | ICD-10-CM | POA: Diagnosis not present

## 2016-12-30 DIAGNOSIS — M8580 Other specified disorders of bone density and structure, unspecified site: Secondary | ICD-10-CM | POA: Diagnosis not present

## 2016-12-30 DIAGNOSIS — I739 Peripheral vascular disease, unspecified: Secondary | ICD-10-CM | POA: Diagnosis not present

## 2016-12-30 DIAGNOSIS — N183 Chronic kidney disease, stage 3 (moderate): Secondary | ICD-10-CM | POA: Diagnosis not present

## 2016-12-30 DIAGNOSIS — Z471 Aftercare following joint replacement surgery: Secondary | ICD-10-CM | POA: Diagnosis not present

## 2016-12-30 DIAGNOSIS — E785 Hyperlipidemia, unspecified: Secondary | ICD-10-CM | POA: Diagnosis not present

## 2016-12-30 DIAGNOSIS — Z79891 Long term (current) use of opiate analgesic: Secondary | ICD-10-CM | POA: Diagnosis not present

## 2016-12-30 DIAGNOSIS — Z96641 Presence of right artificial hip joint: Secondary | ICD-10-CM | POA: Diagnosis not present

## 2016-12-30 DIAGNOSIS — K219 Gastro-esophageal reflux disease without esophagitis: Secondary | ICD-10-CM | POA: Diagnosis not present

## 2016-12-30 DIAGNOSIS — I872 Venous insufficiency (chronic) (peripheral): Secondary | ICD-10-CM | POA: Diagnosis not present

## 2016-12-30 DIAGNOSIS — I129 Hypertensive chronic kidney disease with stage 1 through stage 4 chronic kidney disease, or unspecified chronic kidney disease: Secondary | ICD-10-CM | POA: Diagnosis not present

## 2016-12-30 DIAGNOSIS — Z96653 Presence of artificial knee joint, bilateral: Secondary | ICD-10-CM | POA: Diagnosis not present

## 2017-01-02 DIAGNOSIS — I872 Venous insufficiency (chronic) (peripheral): Secondary | ICD-10-CM | POA: Diagnosis not present

## 2017-01-02 DIAGNOSIS — K219 Gastro-esophageal reflux disease without esophagitis: Secondary | ICD-10-CM | POA: Diagnosis not present

## 2017-01-02 DIAGNOSIS — Z471 Aftercare following joint replacement surgery: Secondary | ICD-10-CM | POA: Diagnosis not present

## 2017-01-02 DIAGNOSIS — Z96653 Presence of artificial knee joint, bilateral: Secondary | ICD-10-CM | POA: Diagnosis not present

## 2017-01-02 DIAGNOSIS — Z79891 Long term (current) use of opiate analgesic: Secondary | ICD-10-CM | POA: Diagnosis not present

## 2017-01-02 DIAGNOSIS — I129 Hypertensive chronic kidney disease with stage 1 through stage 4 chronic kidney disease, or unspecified chronic kidney disease: Secondary | ICD-10-CM | POA: Diagnosis not present

## 2017-01-02 DIAGNOSIS — M150 Primary generalized (osteo)arthritis: Secondary | ICD-10-CM | POA: Diagnosis not present

## 2017-01-02 DIAGNOSIS — I739 Peripheral vascular disease, unspecified: Secondary | ICD-10-CM | POA: Diagnosis not present

## 2017-01-02 DIAGNOSIS — N183 Chronic kidney disease, stage 3 (moderate): Secondary | ICD-10-CM | POA: Diagnosis not present

## 2017-01-02 DIAGNOSIS — E785 Hyperlipidemia, unspecified: Secondary | ICD-10-CM | POA: Diagnosis not present

## 2017-01-02 DIAGNOSIS — M8580 Other specified disorders of bone density and structure, unspecified site: Secondary | ICD-10-CM | POA: Diagnosis not present

## 2017-01-02 DIAGNOSIS — Z96641 Presence of right artificial hip joint: Secondary | ICD-10-CM | POA: Diagnosis not present

## 2017-01-03 DIAGNOSIS — I129 Hypertensive chronic kidney disease with stage 1 through stage 4 chronic kidney disease, or unspecified chronic kidney disease: Secondary | ICD-10-CM | POA: Diagnosis not present

## 2017-01-03 DIAGNOSIS — K219 Gastro-esophageal reflux disease without esophagitis: Secondary | ICD-10-CM | POA: Diagnosis not present

## 2017-01-03 DIAGNOSIS — M8580 Other specified disorders of bone density and structure, unspecified site: Secondary | ICD-10-CM | POA: Diagnosis not present

## 2017-01-03 DIAGNOSIS — N183 Chronic kidney disease, stage 3 (moderate): Secondary | ICD-10-CM | POA: Diagnosis not present

## 2017-01-03 DIAGNOSIS — M150 Primary generalized (osteo)arthritis: Secondary | ICD-10-CM | POA: Diagnosis not present

## 2017-01-03 DIAGNOSIS — I872 Venous insufficiency (chronic) (peripheral): Secondary | ICD-10-CM | POA: Diagnosis not present

## 2017-01-03 DIAGNOSIS — Z79891 Long term (current) use of opiate analgesic: Secondary | ICD-10-CM | POA: Diagnosis not present

## 2017-01-03 DIAGNOSIS — Z471 Aftercare following joint replacement surgery: Secondary | ICD-10-CM | POA: Diagnosis not present

## 2017-01-03 DIAGNOSIS — Z96653 Presence of artificial knee joint, bilateral: Secondary | ICD-10-CM | POA: Diagnosis not present

## 2017-01-03 DIAGNOSIS — Z96641 Presence of right artificial hip joint: Secondary | ICD-10-CM | POA: Diagnosis not present

## 2017-01-03 DIAGNOSIS — I739 Peripheral vascular disease, unspecified: Secondary | ICD-10-CM | POA: Diagnosis not present

## 2017-01-03 DIAGNOSIS — E785 Hyperlipidemia, unspecified: Secondary | ICD-10-CM | POA: Diagnosis not present

## 2017-01-06 DIAGNOSIS — K219 Gastro-esophageal reflux disease without esophagitis: Secondary | ICD-10-CM | POA: Diagnosis not present

## 2017-01-06 DIAGNOSIS — Z96641 Presence of right artificial hip joint: Secondary | ICD-10-CM | POA: Diagnosis not present

## 2017-01-06 DIAGNOSIS — Z471 Aftercare following joint replacement surgery: Secondary | ICD-10-CM | POA: Diagnosis not present

## 2017-01-06 DIAGNOSIS — I872 Venous insufficiency (chronic) (peripheral): Secondary | ICD-10-CM | POA: Diagnosis not present

## 2017-01-06 DIAGNOSIS — M8580 Other specified disorders of bone density and structure, unspecified site: Secondary | ICD-10-CM | POA: Diagnosis not present

## 2017-01-06 DIAGNOSIS — I129 Hypertensive chronic kidney disease with stage 1 through stage 4 chronic kidney disease, or unspecified chronic kidney disease: Secondary | ICD-10-CM | POA: Diagnosis not present

## 2017-01-06 DIAGNOSIS — N183 Chronic kidney disease, stage 3 (moderate): Secondary | ICD-10-CM | POA: Diagnosis not present

## 2017-01-06 DIAGNOSIS — E785 Hyperlipidemia, unspecified: Secondary | ICD-10-CM | POA: Diagnosis not present

## 2017-01-06 DIAGNOSIS — Z96653 Presence of artificial knee joint, bilateral: Secondary | ICD-10-CM | POA: Diagnosis not present

## 2017-01-06 DIAGNOSIS — Z79891 Long term (current) use of opiate analgesic: Secondary | ICD-10-CM | POA: Diagnosis not present

## 2017-01-06 DIAGNOSIS — M150 Primary generalized (osteo)arthritis: Secondary | ICD-10-CM | POA: Diagnosis not present

## 2017-01-06 DIAGNOSIS — I739 Peripheral vascular disease, unspecified: Secondary | ICD-10-CM | POA: Diagnosis not present

## 2017-01-07 DIAGNOSIS — Z79891 Long term (current) use of opiate analgesic: Secondary | ICD-10-CM | POA: Diagnosis not present

## 2017-01-07 DIAGNOSIS — Z96653 Presence of artificial knee joint, bilateral: Secondary | ICD-10-CM | POA: Diagnosis not present

## 2017-01-07 DIAGNOSIS — I872 Venous insufficiency (chronic) (peripheral): Secondary | ICD-10-CM | POA: Diagnosis not present

## 2017-01-07 DIAGNOSIS — I739 Peripheral vascular disease, unspecified: Secondary | ICD-10-CM | POA: Diagnosis not present

## 2017-01-07 DIAGNOSIS — M150 Primary generalized (osteo)arthritis: Secondary | ICD-10-CM | POA: Diagnosis not present

## 2017-01-07 DIAGNOSIS — K219 Gastro-esophageal reflux disease without esophagitis: Secondary | ICD-10-CM | POA: Diagnosis not present

## 2017-01-07 DIAGNOSIS — N183 Chronic kidney disease, stage 3 (moderate): Secondary | ICD-10-CM | POA: Diagnosis not present

## 2017-01-07 DIAGNOSIS — Z96641 Presence of right artificial hip joint: Secondary | ICD-10-CM | POA: Diagnosis not present

## 2017-01-07 DIAGNOSIS — E785 Hyperlipidemia, unspecified: Secondary | ICD-10-CM | POA: Diagnosis not present

## 2017-01-07 DIAGNOSIS — I129 Hypertensive chronic kidney disease with stage 1 through stage 4 chronic kidney disease, or unspecified chronic kidney disease: Secondary | ICD-10-CM | POA: Diagnosis not present

## 2017-01-07 DIAGNOSIS — M8580 Other specified disorders of bone density and structure, unspecified site: Secondary | ICD-10-CM | POA: Diagnosis not present

## 2017-01-07 DIAGNOSIS — Z471 Aftercare following joint replacement surgery: Secondary | ICD-10-CM | POA: Diagnosis not present

## 2017-01-09 DIAGNOSIS — I129 Hypertensive chronic kidney disease with stage 1 through stage 4 chronic kidney disease, or unspecified chronic kidney disease: Secondary | ICD-10-CM | POA: Diagnosis not present

## 2017-01-09 DIAGNOSIS — Z471 Aftercare following joint replacement surgery: Secondary | ICD-10-CM | POA: Diagnosis not present

## 2017-01-09 DIAGNOSIS — M150 Primary generalized (osteo)arthritis: Secondary | ICD-10-CM | POA: Diagnosis not present

## 2017-01-09 DIAGNOSIS — Z79891 Long term (current) use of opiate analgesic: Secondary | ICD-10-CM | POA: Diagnosis not present

## 2017-01-09 DIAGNOSIS — I739 Peripheral vascular disease, unspecified: Secondary | ICD-10-CM | POA: Diagnosis not present

## 2017-01-09 DIAGNOSIS — Z96653 Presence of artificial knee joint, bilateral: Secondary | ICD-10-CM | POA: Diagnosis not present

## 2017-01-09 DIAGNOSIS — I872 Venous insufficiency (chronic) (peripheral): Secondary | ICD-10-CM | POA: Diagnosis not present

## 2017-01-09 DIAGNOSIS — N183 Chronic kidney disease, stage 3 (moderate): Secondary | ICD-10-CM | POA: Diagnosis not present

## 2017-01-09 DIAGNOSIS — M8580 Other specified disorders of bone density and structure, unspecified site: Secondary | ICD-10-CM | POA: Diagnosis not present

## 2017-01-09 DIAGNOSIS — Z96641 Presence of right artificial hip joint: Secondary | ICD-10-CM | POA: Diagnosis not present

## 2017-01-09 DIAGNOSIS — E785 Hyperlipidemia, unspecified: Secondary | ICD-10-CM | POA: Diagnosis not present

## 2017-01-09 DIAGNOSIS — K219 Gastro-esophageal reflux disease without esophagitis: Secondary | ICD-10-CM | POA: Diagnosis not present

## 2017-01-10 DIAGNOSIS — Z79891 Long term (current) use of opiate analgesic: Secondary | ICD-10-CM | POA: Diagnosis not present

## 2017-01-10 DIAGNOSIS — N183 Chronic kidney disease, stage 3 (moderate): Secondary | ICD-10-CM | POA: Diagnosis not present

## 2017-01-10 DIAGNOSIS — E785 Hyperlipidemia, unspecified: Secondary | ICD-10-CM | POA: Diagnosis not present

## 2017-01-10 DIAGNOSIS — I129 Hypertensive chronic kidney disease with stage 1 through stage 4 chronic kidney disease, or unspecified chronic kidney disease: Secondary | ICD-10-CM | POA: Diagnosis not present

## 2017-01-10 DIAGNOSIS — Z96653 Presence of artificial knee joint, bilateral: Secondary | ICD-10-CM | POA: Diagnosis not present

## 2017-01-10 DIAGNOSIS — M8580 Other specified disorders of bone density and structure, unspecified site: Secondary | ICD-10-CM | POA: Diagnosis not present

## 2017-01-10 DIAGNOSIS — M150 Primary generalized (osteo)arthritis: Secondary | ICD-10-CM | POA: Diagnosis not present

## 2017-01-10 DIAGNOSIS — K219 Gastro-esophageal reflux disease without esophagitis: Secondary | ICD-10-CM | POA: Diagnosis not present

## 2017-01-10 DIAGNOSIS — I872 Venous insufficiency (chronic) (peripheral): Secondary | ICD-10-CM | POA: Diagnosis not present

## 2017-01-10 DIAGNOSIS — I739 Peripheral vascular disease, unspecified: Secondary | ICD-10-CM | POA: Diagnosis not present

## 2017-01-10 DIAGNOSIS — Z471 Aftercare following joint replacement surgery: Secondary | ICD-10-CM | POA: Diagnosis not present

## 2017-01-10 DIAGNOSIS — Z96641 Presence of right artificial hip joint: Secondary | ICD-10-CM | POA: Diagnosis not present

## 2017-01-14 ENCOUNTER — Telehealth (INDEPENDENT_AMBULATORY_CARE_PROVIDER_SITE_OTHER): Payer: Self-pay | Admitting: Orthopaedic Surgery

## 2017-01-14 DIAGNOSIS — Z79891 Long term (current) use of opiate analgesic: Secondary | ICD-10-CM | POA: Diagnosis not present

## 2017-01-14 DIAGNOSIS — M150 Primary generalized (osteo)arthritis: Secondary | ICD-10-CM | POA: Diagnosis not present

## 2017-01-14 DIAGNOSIS — M8580 Other specified disorders of bone density and structure, unspecified site: Secondary | ICD-10-CM | POA: Diagnosis not present

## 2017-01-14 DIAGNOSIS — I872 Venous insufficiency (chronic) (peripheral): Secondary | ICD-10-CM | POA: Diagnosis not present

## 2017-01-14 DIAGNOSIS — N183 Chronic kidney disease, stage 3 (moderate): Secondary | ICD-10-CM | POA: Diagnosis not present

## 2017-01-14 DIAGNOSIS — Z96641 Presence of right artificial hip joint: Secondary | ICD-10-CM | POA: Diagnosis not present

## 2017-01-14 DIAGNOSIS — I739 Peripheral vascular disease, unspecified: Secondary | ICD-10-CM | POA: Diagnosis not present

## 2017-01-14 DIAGNOSIS — Z471 Aftercare following joint replacement surgery: Secondary | ICD-10-CM | POA: Diagnosis not present

## 2017-01-14 DIAGNOSIS — K219 Gastro-esophageal reflux disease without esophagitis: Secondary | ICD-10-CM | POA: Diagnosis not present

## 2017-01-14 DIAGNOSIS — Z96653 Presence of artificial knee joint, bilateral: Secondary | ICD-10-CM | POA: Diagnosis not present

## 2017-01-14 DIAGNOSIS — E785 Hyperlipidemia, unspecified: Secondary | ICD-10-CM | POA: Diagnosis not present

## 2017-01-14 DIAGNOSIS — I129 Hypertensive chronic kidney disease with stage 1 through stage 4 chronic kidney disease, or unspecified chronic kidney disease: Secondary | ICD-10-CM | POA: Diagnosis not present

## 2017-01-14 NOTE — Telephone Encounter (Signed)
OK by PW called

## 2017-01-14 NOTE — Telephone Encounter (Signed)
Charisse from [facility] called requesting to extend patient's physical therapy orders since they ran out last week. They are requesting 2x for one week until patient has follow up appointment with Dr. Durward Fortes on 01/24/17.   Cb# [phone]

## 2017-01-15 DIAGNOSIS — Z96641 Presence of right artificial hip joint: Secondary | ICD-10-CM | POA: Diagnosis not present

## 2017-01-15 DIAGNOSIS — M150 Primary generalized (osteo)arthritis: Secondary | ICD-10-CM | POA: Diagnosis not present

## 2017-01-15 DIAGNOSIS — Z471 Aftercare following joint replacement surgery: Secondary | ICD-10-CM | POA: Diagnosis not present

## 2017-01-15 DIAGNOSIS — K219 Gastro-esophageal reflux disease without esophagitis: Secondary | ICD-10-CM | POA: Diagnosis not present

## 2017-01-15 DIAGNOSIS — I129 Hypertensive chronic kidney disease with stage 1 through stage 4 chronic kidney disease, or unspecified chronic kidney disease: Secondary | ICD-10-CM | POA: Diagnosis not present

## 2017-01-15 DIAGNOSIS — E785 Hyperlipidemia, unspecified: Secondary | ICD-10-CM | POA: Diagnosis not present

## 2017-01-15 DIAGNOSIS — Z96653 Presence of artificial knee joint, bilateral: Secondary | ICD-10-CM | POA: Diagnosis not present

## 2017-01-15 DIAGNOSIS — I872 Venous insufficiency (chronic) (peripheral): Secondary | ICD-10-CM | POA: Diagnosis not present

## 2017-01-15 DIAGNOSIS — M8580 Other specified disorders of bone density and structure, unspecified site: Secondary | ICD-10-CM | POA: Diagnosis not present

## 2017-01-15 DIAGNOSIS — N183 Chronic kidney disease, stage 3 (moderate): Secondary | ICD-10-CM | POA: Diagnosis not present

## 2017-01-15 DIAGNOSIS — Z79891 Long term (current) use of opiate analgesic: Secondary | ICD-10-CM | POA: Diagnosis not present

## 2017-01-15 DIAGNOSIS — I739 Peripheral vascular disease, unspecified: Secondary | ICD-10-CM | POA: Diagnosis not present

## 2017-01-16 DIAGNOSIS — Z96653 Presence of artificial knee joint, bilateral: Secondary | ICD-10-CM | POA: Diagnosis not present

## 2017-01-16 DIAGNOSIS — I739 Peripheral vascular disease, unspecified: Secondary | ICD-10-CM | POA: Diagnosis not present

## 2017-01-16 DIAGNOSIS — M8580 Other specified disorders of bone density and structure, unspecified site: Secondary | ICD-10-CM | POA: Diagnosis not present

## 2017-01-16 DIAGNOSIS — M150 Primary generalized (osteo)arthritis: Secondary | ICD-10-CM | POA: Diagnosis not present

## 2017-01-16 DIAGNOSIS — K219 Gastro-esophageal reflux disease without esophagitis: Secondary | ICD-10-CM | POA: Diagnosis not present

## 2017-01-16 DIAGNOSIS — Z79891 Long term (current) use of opiate analgesic: Secondary | ICD-10-CM | POA: Diagnosis not present

## 2017-01-16 DIAGNOSIS — I129 Hypertensive chronic kidney disease with stage 1 through stage 4 chronic kidney disease, or unspecified chronic kidney disease: Secondary | ICD-10-CM | POA: Diagnosis not present

## 2017-01-16 DIAGNOSIS — E785 Hyperlipidemia, unspecified: Secondary | ICD-10-CM | POA: Diagnosis not present

## 2017-01-16 DIAGNOSIS — Z471 Aftercare following joint replacement surgery: Secondary | ICD-10-CM | POA: Diagnosis not present

## 2017-01-16 DIAGNOSIS — N183 Chronic kidney disease, stage 3 (moderate): Secondary | ICD-10-CM | POA: Diagnosis not present

## 2017-01-16 DIAGNOSIS — I872 Venous insufficiency (chronic) (peripheral): Secondary | ICD-10-CM | POA: Diagnosis not present

## 2017-01-16 DIAGNOSIS — Z96641 Presence of right artificial hip joint: Secondary | ICD-10-CM | POA: Diagnosis not present

## 2017-01-17 DIAGNOSIS — Z96641 Presence of right artificial hip joint: Secondary | ICD-10-CM | POA: Diagnosis not present

## 2017-01-17 DIAGNOSIS — I739 Peripheral vascular disease, unspecified: Secondary | ICD-10-CM | POA: Diagnosis not present

## 2017-01-17 DIAGNOSIS — Z471 Aftercare following joint replacement surgery: Secondary | ICD-10-CM | POA: Diagnosis not present

## 2017-01-17 DIAGNOSIS — Z96653 Presence of artificial knee joint, bilateral: Secondary | ICD-10-CM | POA: Diagnosis not present

## 2017-01-17 DIAGNOSIS — M150 Primary generalized (osteo)arthritis: Secondary | ICD-10-CM | POA: Diagnosis not present

## 2017-01-17 DIAGNOSIS — Z79891 Long term (current) use of opiate analgesic: Secondary | ICD-10-CM | POA: Diagnosis not present

## 2017-01-17 DIAGNOSIS — I129 Hypertensive chronic kidney disease with stage 1 through stage 4 chronic kidney disease, or unspecified chronic kidney disease: Secondary | ICD-10-CM | POA: Diagnosis not present

## 2017-01-17 DIAGNOSIS — K219 Gastro-esophageal reflux disease without esophagitis: Secondary | ICD-10-CM | POA: Diagnosis not present

## 2017-01-17 DIAGNOSIS — M8580 Other specified disorders of bone density and structure, unspecified site: Secondary | ICD-10-CM | POA: Diagnosis not present

## 2017-01-17 DIAGNOSIS — E785 Hyperlipidemia, unspecified: Secondary | ICD-10-CM | POA: Diagnosis not present

## 2017-01-17 DIAGNOSIS — I872 Venous insufficiency (chronic) (peripheral): Secondary | ICD-10-CM | POA: Diagnosis not present

## 2017-01-17 DIAGNOSIS — N183 Chronic kidney disease, stage 3 (moderate): Secondary | ICD-10-CM | POA: Diagnosis not present

## 2017-01-20 ENCOUNTER — Encounter: Payer: Self-pay | Admitting: Internal Medicine

## 2017-01-20 DIAGNOSIS — I739 Peripheral vascular disease, unspecified: Secondary | ICD-10-CM | POA: Diagnosis not present

## 2017-01-20 DIAGNOSIS — M150 Primary generalized (osteo)arthritis: Secondary | ICD-10-CM | POA: Diagnosis not present

## 2017-01-20 DIAGNOSIS — Z96641 Presence of right artificial hip joint: Secondary | ICD-10-CM | POA: Diagnosis not present

## 2017-01-20 DIAGNOSIS — N183 Chronic kidney disease, stage 3 (moderate): Secondary | ICD-10-CM | POA: Diagnosis not present

## 2017-01-20 DIAGNOSIS — M8580 Other specified disorders of bone density and structure, unspecified site: Secondary | ICD-10-CM | POA: Diagnosis not present

## 2017-01-20 DIAGNOSIS — K219 Gastro-esophageal reflux disease without esophagitis: Secondary | ICD-10-CM | POA: Diagnosis not present

## 2017-01-20 DIAGNOSIS — Z471 Aftercare following joint replacement surgery: Secondary | ICD-10-CM | POA: Diagnosis not present

## 2017-01-20 DIAGNOSIS — Z96653 Presence of artificial knee joint, bilateral: Secondary | ICD-10-CM | POA: Diagnosis not present

## 2017-01-20 DIAGNOSIS — I129 Hypertensive chronic kidney disease with stage 1 through stage 4 chronic kidney disease, or unspecified chronic kidney disease: Secondary | ICD-10-CM | POA: Diagnosis not present

## 2017-01-20 DIAGNOSIS — Z79891 Long term (current) use of opiate analgesic: Secondary | ICD-10-CM | POA: Diagnosis not present

## 2017-01-20 DIAGNOSIS — I872 Venous insufficiency (chronic) (peripheral): Secondary | ICD-10-CM | POA: Diagnosis not present

## 2017-01-20 DIAGNOSIS — E785 Hyperlipidemia, unspecified: Secondary | ICD-10-CM | POA: Diagnosis not present

## 2017-01-21 DIAGNOSIS — L820 Inflamed seborrheic keratosis: Secondary | ICD-10-CM | POA: Diagnosis not present

## 2017-01-22 ENCOUNTER — Encounter: Payer: Self-pay | Admitting: Internal Medicine

## 2017-01-22 ENCOUNTER — Ambulatory Visit (INDEPENDENT_AMBULATORY_CARE_PROVIDER_SITE_OTHER): Payer: Medicare Other | Admitting: Internal Medicine

## 2017-01-22 ENCOUNTER — Other Ambulatory Visit (INDEPENDENT_AMBULATORY_CARE_PROVIDER_SITE_OTHER): Payer: Medicare Other

## 2017-01-22 VITALS — BP 130/58 | HR 75 | Temp 98.0°F | Resp 16 | Ht 63.5 in | Wt 177.0 lb

## 2017-01-22 DIAGNOSIS — I10 Essential (primary) hypertension: Secondary | ICD-10-CM

## 2017-01-22 DIAGNOSIS — N183 Chronic kidney disease, stage 3 unspecified: Secondary | ICD-10-CM

## 2017-01-22 DIAGNOSIS — I519 Heart disease, unspecified: Secondary | ICD-10-CM

## 2017-01-22 DIAGNOSIS — R600 Localized edema: Secondary | ICD-10-CM | POA: Diagnosis not present

## 2017-01-22 DIAGNOSIS — D588 Other specified hereditary hemolytic anemias: Secondary | ICD-10-CM | POA: Diagnosis not present

## 2017-01-22 DIAGNOSIS — I5189 Other ill-defined heart diseases: Secondary | ICD-10-CM

## 2017-01-22 DIAGNOSIS — R7989 Other specified abnormal findings of blood chemistry: Secondary | ICD-10-CM

## 2017-01-22 LAB — CBC WITH DIFFERENTIAL/PLATELET
Basophils Absolute: 0.1 10*3/uL (ref 0.0–0.1)
Basophils Relative: 0.9 % (ref 0.0–3.0)
EOS PCT: 3.2 % (ref 0.0–5.0)
Eosinophils Absolute: 0.2 10*3/uL (ref 0.0–0.7)
HCT: 26.7 % — ABNORMAL LOW (ref 36.0–46.0)
Hemoglobin: 9.2 g/dL — ABNORMAL LOW (ref 12.0–15.0)
LYMPHS ABS: 1 10*3/uL (ref 0.7–4.0)
LYMPHS PCT: 13.7 % (ref 12.0–46.0)
MCHC: 34.4 g/dL (ref 30.0–36.0)
MCV: 88 fl (ref 78.0–100.0)
MONOS PCT: 8.4 % (ref 3.0–12.0)
Monocytes Absolute: 0.6 10*3/uL (ref 0.1–1.0)
NEUTROS PCT: 73.8 % (ref 43.0–77.0)
Neutro Abs: 5.6 10*3/uL (ref 1.4–7.7)
Platelets: 226 10*3/uL (ref 150.0–400.0)
RBC: 3.03 Mil/uL — AB (ref 3.87–5.11)
RDW: 20.9 % — ABNORMAL HIGH (ref 11.5–15.5)
WBC: 7.5 10*3/uL (ref 4.0–10.5)

## 2017-01-22 LAB — COMPREHENSIVE METABOLIC PANEL
ALBUMIN: 3.9 g/dL (ref 3.5–5.2)
ALK PHOS: 80 U/L (ref 39–117)
ALT: 11 U/L (ref 0–35)
AST: 16 U/L (ref 0–37)
BILIRUBIN TOTAL: 2.2 mg/dL — AB (ref 0.2–1.2)
BUN: 19 mg/dL (ref 6–23)
CO2: 23 mEq/L (ref 19–32)
Calcium: 8.9 mg/dL (ref 8.4–10.5)
Chloride: 112 mEq/L (ref 96–112)
Creatinine, Ser: 1.14 mg/dL (ref 0.40–1.20)
GFR: 48.4 mL/min — AB (ref >60.00)
GLUCOSE: 83 mg/dL (ref 70–99)
POTASSIUM: 4.3 meq/L (ref 3.5–5.1)
Sodium: 142 mEq/L (ref 135–145)
TOTAL PROTEIN: 6.2 g/dL (ref 6.0–8.3)

## 2017-01-22 LAB — URINALYSIS, ROUTINE W REFLEX MICROSCOPIC
Bilirubin Urine: NEGATIVE
Hgb urine dipstick: NEGATIVE
Ketones, ur: NEGATIVE
Nitrite: NEGATIVE
PH: 6.5 (ref 5.0–8.0)
RBC / HPF: NONE SEEN (ref 0–?)
SPECIFIC GRAVITY, URINE: 1.015 (ref 1.000–1.030)
UROBILINOGEN UA: 0.2 (ref 0.0–1.0)
Urine Glucose: NEGATIVE

## 2017-01-22 LAB — D-DIMER, QUANTITATIVE (NOT AT ARMC): D DIMER QUANT: 1.87 ug{FEU}/mL — AB (ref <0.50)

## 2017-01-22 LAB — TSH: TSH: 1.32 u[IU]/mL (ref 0.35–4.50)

## 2017-01-22 LAB — BRAIN NATRIURETIC PEPTIDE: PRO B NATRI PEPTIDE: 299 pg/mL — AB (ref 0.0–100.0)

## 2017-01-22 MED ORDER — TORSEMIDE 10 MG PO TABS: 10.0000 mg | ORAL_TABLET | Freq: Every day | ORAL | 2 refills | Status: DC

## 2017-01-22 MED ORDER — RIVAROXABAN 15 MG PO TABS: 15.0000 mg | ORAL_TABLET | Freq: Two times a day (BID) | ORAL | 0 refills | Status: DC

## 2017-01-22 NOTE — Progress Notes (Signed)
Subjective:  Patient ID: Claudia Lara, female    DOB: Mar 19, 1932  Age: 83 y.o. MRN: [id number]  CC: Edema   HPI Claudia Lara presents for concerns about lower extremity edema over the last week or 2. She is 8 weeks status post total right hip replacement. The edema is more prominent on the right than the left. She does not experience any lower extremity pain and she denies chest pain, palpitations, shortness of breath, diaphoresis, or edema. She has a history of diastolic dysfunction and has had trouble with edema previously but it is never been quite this bad.  Outpatient Medications Prior to Visit  Medication Sig Dispense Refill  . Artificial Tear Ointment (DRY EYES OP) Place 1 drop into both eyes 3 (three) times daily as needed.    . Calcium Carbonate-Vit D-Min (CALCIUM 600 + MINERALS) 600-200 MG-UNIT TABS Take 1 tablet by mouth every evening.     . carvedilol (COREG) 12.5 MG tablet TAKE 1 TABLET BY MOUTH 2 TIMES DAILY WITH A MEAL. 180 tablet 2  . cholecalciferol (VITAMIN D) 1000 units tablet Take 1,000 Units by mouth daily.    . methocarbamol (ROBAXIN) 500 MG tablet Take 1 tablet (500 mg total) by mouth every 6 (six) hours as needed for muscle spasms. 30 tablet 0  . multivitamin-iron-minerals-folic acid (CENTRUM) chewable tablet Chew 1 tablet by mouth daily. Has stopped prior to procedure    . pantoprazole (PROTONIX) 40 MG tablet TAKE 1 TABLET (40 MG TOTAL) BY MOUTH DAILY. 90 tablet 1  . vitamin C (ASCORBIC ACID) 500 MG tablet Take 500 mg by mouth daily.    [name] oxycodone (OXY-IR) 5 MG capsule Take 5 mg by mouth every 4 (four) hours as needed for pain.    . polyethylene glycol (MIRALAX / GLYCOLAX) packet Take 17 g by mouth daily as needed for mild constipation.    . rivaroxaban (XARELTO) 10 MG TABS tablet Take 1 tablet (10 mg total) by mouth daily with breakfast. 30 tablet 0  . sennosides-docusate sodium (SENOKOT-S) 8.6-50 MG tablet Take 2 tablets by mouth at bedtime.     No  facility-administered medications prior to visit.     ROS Review of Systems  Constitutional: Negative.  Negative for activity change, appetite change, diaphoresis, fatigue and unexpected weight change.  HENT: Negative.   Eyes: Negative for visual disturbance.  Respiratory: Negative for cough, chest tightness, shortness of breath, wheezing and stridor.   Cardiovascular: Positive for leg swelling. Negative for chest pain and palpitations.  Gastrointestinal: Negative.  Negative for abdominal pain, constipation, diarrhea, nausea and vomiting.  Endocrine: Negative for cold intolerance and heat intolerance.  Genitourinary: Negative.  Negative for decreased urine volume, difficulty urinating, dysuria, enuresis, flank pain, hematuria and urgency.  Musculoskeletal: Negative.  Negative for back pain and myalgias.  Skin: Negative.   Allergic/Immunologic: Negative.   Neurological: Negative.  Negative for dizziness and numbness.  Hematological: Negative.  Negative for adenopathy.  Psychiatric/Behavioral: Negative.     Objective:  BP (!) 130/58 (BP Location: Left Arm, Patient Position: Sitting, Cuff Size: Normal)   Pulse 75   Temp 98 F (36.7 C) (Oral)   Resp 16   Ht 5' 3.5" (1.613 m)   Wt 177 lb (80.3 kg)   SpO2 96%   BMI 30.86 kg/m   BP Readings from Last 3 Encounters:  01/22/17 (!) 130/58  12/23/16 (!) 132/51  12/16/16 131/61    Wt Readings from Last 3 Encounters:  01/22/17 177 lb (80.3 kg)  12/23/16 167 lb (75.8 kg)  12/16/16 169 lb 15.6 oz (77.1 kg)    Physical Exam  Constitutional: She is oriented to person, place, and time. No distress.  HENT:  Mouth/Throat: Oropharynx is clear and moist. No oropharyngeal exudate.  Eyes: Conjunctivae are normal. Right eye exhibits no discharge. Left eye exhibits no discharge. No scleral icterus.  Neck: Normal range of motion. Neck supple. No JVD present. No tracheal deviation present. No thyromegaly present.  Cardiovascular: Normal rate,  regular rhythm, S1 normal, S2 normal and intact distal pulses.  Exam reveals no gallop, no S3, no S4 and no friction rub.   Murmur heard.  Decrescendo systolic murmur is present with a grade of 1/6   No diastolic murmur is present  Pulmonary/Chest: Effort normal and breath sounds normal. No stridor. No respiratory distress. She has no wheezes. She has no rales. She exhibits no tenderness.  Abdominal: Soft. Bowel sounds are normal. She exhibits no distension and no mass. There is no tenderness. There is no rebound and no guarding.  Musculoskeletal: Normal range of motion. She exhibits edema. She exhibits no tenderness or deformity.  2+ pitting edema in RLE 1+ pitting edema in LLE  Lymphadenopathy:    She has no cervical adenopathy.  Neurological: She is oriented to person, place, and time.  Skin: Skin is warm and dry. No rash noted. She is not diaphoretic. No erythema. No pallor.  Psychiatric: She has a normal mood and affect. Her behavior is normal. Judgment and thought content normal.  Vitals reviewed.   Lab Results  Component Value Date   WBC 7.5 01/22/2017   HGB 9.2 (L) 01/22/2017   HCT 26.7 Repeated and verified X2. (L) 01/22/2017   PLT 226.0 01/22/2017   GLUCOSE 83 01/22/2017   CHOL 128 07/09/2016   TRIG 74.0 07/09/2016   HDL 62.60 07/09/2016   LDLCALC 50 07/09/2016   ALT 11 01/22/2017   AST 16 01/22/2017   NA 142 01/22/2017   K 4.3 01/22/2017   CL 112 01/22/2017   CREATININE 1.14 01/22/2017   BUN 19 01/22/2017   CO2 23 01/22/2017   TSH 1.32 01/22/2017   INR 1.66 12/02/2016   HGBA1C 5.2 03/05/2016    No results found.  Assessment & Plan:   Claudia Lara was seen today for edema.  Diagnoses and all orders for this visit:  Other specified hereditary hemolytic anemias ([place])- improvement noted -     CBC with Differential/Platelet; Future  Kidney disease, chronic, stage III (GFR 30-59 ml/min)- her renal fxn is slightly improved, will avoid nephrotoxic agents -      Comprehensive metabolic panel; Future  Localized edema- she has a slightly elevated d-dimer so will treat empirically for DVT and order an ultrasound to confirm the presence or absence of a DVT, she also has an elevated BNP so will start a loop diuretic. Her urine is negative for protein so she does not have nephrotic syndrome. Her TSH is normal so this is not related to thyroid dysfunction either. -     TSH; Future -     Urinalysis, Routine w reflex microscopic; Future -     D-dimer, quantitative (not at [facility]); Future -     Brain natriuretic peptide; Future -     torsemide (DEMADEX) 10 MG tablet; Take 1 tablet (10 mg total) by mouth daily. -     VAS [place] LOWER EXTREMITY VENOUS (DVT); Future -     Rivaroxaban (XARELTO) 15 MG TABS tablet; Take 1 tablet (15  mg total) by mouth 2 (two) times daily with a meal.  Essential hypertension- her blood pressures adequately well-controlled -     torsemide (DEMADEX) 10 MG tablet; Take 1 tablet (10 mg total) by mouth daily.  Left ventricular diastolic dysfunction, NYHA class 1- she is symptomatic with lower extremity edema, will start a loop diuretic. -     torsemide (DEMADEX) 10 MG tablet; Take 1 tablet (10 mg total) by mouth daily.  D-dimer, elevated- will start Xarelto at an appropriate renally adjusted dose, will get a venous ultrasound to confirm DVT -     VAS [place] LOWER EXTREMITY VENOUS (DVT); Future -     Rivaroxaban (XARELTO) 15 MG TABS tablet; Take 1 tablet (15 mg total) by mouth 2 (two) times daily with a meal.   I have discontinued Ms. Hern rivaroxaban, polyethylene glycol, sennosides-docusate sodium, and oxycodone. I am also having her start on torsemide and Rivaroxaban. Additionally, I am having her maintain her CALCIUM 600 + MINERALS, carvedilol, pantoprazole, multivitamin-iron-minerals-folic acid, vitamin C, Artificial Tear Ointment (DRY EYES OP), methocarbamol, and cholecalciferol.  Meds ordered this encounter  Medications  . torsemide  (DEMADEX) 10 MG tablet    Sig: Take 1 tablet (10 mg total) by mouth daily.    Dispense:  30 tablet    Refill:  2  . Rivaroxaban (XARELTO) 15 MG TABS tablet    Sig: Take 1 tablet (15 mg total) by mouth 2 (two) times daily with a meal.    Dispense:  30 tablet    Refill:  0     Follow-up: Return in about 3 weeks (around 02/12/2017).  [name], MD

## 2017-01-22 NOTE — Patient Instructions (Signed)
Edema Edema is an abnormal buildup of fluids in your bodytissues. Edema is somewhatdependent on gravity to pull the fluid to the lowest place in your body. That makes the condition more common in the legs and thighs (lower extremities). Painless swelling of the feet and ankles is common and becomes more likely as you get older. It is also common in looser tissues, like around your eyes. When the affected area is squeezed, the fluid may move out of that spot and leave a dent for a few moments. This dent is called pitting. What are the causes? There are many possible causes of edema. Eating too much salt and being on your feet or sitting for a long time can cause edema in your legs and ankles. Hot weather may make edema worse. Common medical causes of edema include:  Heart failure.  Liver disease.  Kidney disease.  Weak blood vessels in your legs.  Cancer.  An injury.  Pregnancy.  Some medications.  Obesity.  What are the signs or symptoms? Edema is usually painless.Your skin may look swollen or shiny. How is this diagnosed? Your health care provider may be able to diagnose edema by asking about your medical history and doing a physical exam. You may need to have tests such as X-rays, an electrocardiogram, or blood tests to check for medical conditions that may cause edema. How is this treated? Edema treatment depends on the cause. If you have heart, liver, or kidney disease, you need the treatment appropriate for these conditions. General treatment may include:  Elevation of the affected body part above the level of your heart.  Compression of the affected body part. Pressure from elastic bandages or support stockings squeezes the tissues and forces fluid back into the blood vessels. This keeps fluid from entering the tissues.  Restriction of fluid and salt intake.  Use of a water pill (diuretic). These medications are appropriate only for some types of edema. They pull fluid  out of your body and make you urinate more often. This gets rid of fluid and reduces swelling, but diuretics can have side effects. Only use diuretics as directed by your health care provider.  Follow these instructions at home:  Keep the affected body part above the level of your heart when you are lying down.  Do not sit still or stand for prolonged periods.  Do not put anything directly under your knees when lying down.  Do not wear constricting clothing or garters on your upper legs.  Exercise your legs to work the fluid back into your blood vessels. This may help the swelling go down.  Wear elastic bandages or support stockings to reduce ankle swelling as directed by your health care provider.  Eat a low-salt diet to reduce fluid if your health care provider recommends it.  Only take medicines as directed by your health care provider. Contact a health care provider if:  Your edema is not responding to treatment.  You have heart, liver, or kidney disease and notice symptoms of edema.  You have edema in your legs that does not improve after elevating them.  You have sudden and unexplained weight gain. Get help right away if:  You develop shortness of breath or chest pain.  You cannot breathe when you lie down.  You develop pain, redness, or warmth in the swollen areas.  You have heart, liver, or kidney disease and suddenly get edema.  You have a fever and your symptoms suddenly get worse. This information is   not intended to replace advice given to you by your health care provider. Make sure you discuss any questions you have with your health care provider. Document Released: 11/11/2005 Document Revised: 04/18/2016 Document Reviewed: 09/03/2013 Elsevier Interactive Patient Education  2017 Elsevier Inc.  

## 2017-01-22 NOTE — Progress Notes (Signed)
Pre visit review using our clinic review tool, if applicable. No additional management support is needed unless otherwise documented below in the visit note. 

## 2017-01-24 ENCOUNTER — Ambulatory Visit (INDEPENDENT_AMBULATORY_CARE_PROVIDER_SITE_OTHER): Payer: Medicare Other | Admitting: Orthopaedic Surgery

## 2017-01-24 ENCOUNTER — Telehealth (INDEPENDENT_AMBULATORY_CARE_PROVIDER_SITE_OTHER): Payer: Self-pay | Admitting: Orthopaedic Surgery

## 2017-01-24 ENCOUNTER — Encounter (INDEPENDENT_AMBULATORY_CARE_PROVIDER_SITE_OTHER): Payer: Self-pay | Admitting: Orthopaedic Surgery

## 2017-01-24 VITALS — BP 131/53 | HR 77 | Resp 16 | Ht 63.5 in | Wt 177.0 lb

## 2017-01-24 DIAGNOSIS — M8580 Other specified disorders of bone density and structure, unspecified site: Secondary | ICD-10-CM | POA: Diagnosis not present

## 2017-01-24 DIAGNOSIS — K219 Gastro-esophageal reflux disease without esophagitis: Secondary | ICD-10-CM | POA: Diagnosis not present

## 2017-01-24 DIAGNOSIS — N183 Chronic kidney disease, stage 3 (moderate): Secondary | ICD-10-CM | POA: Diagnosis not present

## 2017-01-24 DIAGNOSIS — I739 Peripheral vascular disease, unspecified: Secondary | ICD-10-CM | POA: Diagnosis not present

## 2017-01-24 DIAGNOSIS — M150 Primary generalized (osteo)arthritis: Secondary | ICD-10-CM | POA: Diagnosis not present

## 2017-01-24 DIAGNOSIS — Z96641 Presence of right artificial hip joint: Secondary | ICD-10-CM

## 2017-01-24 DIAGNOSIS — Z79891 Long term (current) use of opiate analgesic: Secondary | ICD-10-CM | POA: Diagnosis not present

## 2017-01-24 DIAGNOSIS — E785 Hyperlipidemia, unspecified: Secondary | ICD-10-CM | POA: Diagnosis not present

## 2017-01-24 DIAGNOSIS — Z471 Aftercare following joint replacement surgery: Secondary | ICD-10-CM | POA: Diagnosis not present

## 2017-01-24 DIAGNOSIS — Z96653 Presence of artificial knee joint, bilateral: Secondary | ICD-10-CM | POA: Diagnosis not present

## 2017-01-24 DIAGNOSIS — I872 Venous insufficiency (chronic) (peripheral): Secondary | ICD-10-CM | POA: Diagnosis not present

## 2017-01-24 DIAGNOSIS — I129 Hypertensive chronic kidney disease with stage 1 through stage 4 chronic kidney disease, or unspecified chronic kidney disease: Secondary | ICD-10-CM | POA: Diagnosis not present

## 2017-01-24 NOTE — Progress Notes (Signed)
Office Visit Note   Patient: Claudia Lara           Date of Birth: 1932/06/14           MRN: [id number] Visit Date: 01/24/2017              Requested by: Janith Lima, MD [address], [place] [address] PCP: [name], MD   Assessment & Plan: Visit Diagnoses: 2 months status post right total hip replacement without complications   Plan: Finish in home physical therapy, outpatient physical therapy at [facility]. Weightbearing as tolerated with a cane . He think Claudia Lara is really weak and deconditioned. An outpatient course of physical therapy and follow up at a local Y would be worthwhile. Follow-up in 3 months. Follow-Up Instructions: No Follow-up on file.   Orders:  No orders of the defined types were placed in this encounter.  No orders of the defined types were placed in this encounter.     Procedures: No procedures performed   Clinical Data: No additional findings.   Subjective: Chief Complaint  Patient presents with  . Right Hip - Follow-up    Right hip replacement 11/26/2016, PT, wants outpatient PT, feels like a "log", no pain Right leg pain x 8 months, cannot put a sock on, no injury, swelling Right knee pain, TKR 09/2010, pain at times, improving with PT, swelling Low back pain x year, pain worse in the morning, wants injections w/Dr. Ernestina Patches again  Claudia Lara relates that she's not having any "pain". She does feel like her right lower extremity is "heavy" this certainly could be a combination of factors in terms of postoperative weakness and an issue with her back. I think it's a good idea to have Dr. Ernestina Patches reevaluate her.. Not having any knee pain.  Review of Systems   Objective: Vital Signs: BP (!) 131/53 (BP Location: Left Arm, Patient Position: Sitting, Cuff Size: Normal)   Pulse 77   Resp 16   Ht 5' 3.5" (1.613 m)   Wt 177 lb (80.3 kg)   BMI 30.86 kg/m   Physical Exam  Ortho Exam painless range of motion of right  hip with internal/external rotation flexion and extension. Straight leg raise negative. No pain around the right hip incision. May be slightly longer than the left. No pain about either knee. Prior right total knee replacement. No swelling distally. Neurovascular exam intact.  Specialty Comments:  No specialty comments available.  Imaging: No results found.   PMFS History: Patient Active Problem List   Diagnosis Date Noted  . Localized edema 01/22/2017  . D-dimer, elevated 01/22/2017  . Unilateral primary osteoarthritis, right hip 11/26/2016  . S/P total hip arthroplasty 11/26/2016  . GERD (gastroesophageal reflux disease) 08/03/2015  . Hemolytic anemia ([place]) 07/12/2015  . Chronic venous insufficiency 08/10/2014  . Aortic stenosis, mild 06/29/2014  . Left ventricular diastolic dysfunction, NYHA class 1 06/29/2014  . Senile osteopenia 04/21/2014  . Hyperlipidemia with target LDL less than 130 04/21/2014  . Kidney disease, chronic, stage III (GFR 30-59 ml/min) 04/21/2014  . Routine health maintenance 07/16/2012  . Carotid artery stenosis without cerebral infarction 01/16/2010  . Essential hypertension 10/19/2007   Past Medical History:  Diagnosis Date  . ANEMIA 08/22/2009  . CAROTID ARTERY STENOSIS 01/16/2010  . Complication of anesthesia    very hard to wake up from surgery  . DYSPNEA ON EXERTION 11/16/2007  . Gallstones   . GEN OSTEOARTHROSIS INVOLVING MULTIPLE  SITES 11/11/2008  . GERD (gastroesophageal reflux disease)   . HEMORRHOIDS, INTERNAL    surgery was in the 90's (per patient)  . NECK PAIN, ACUTE 12/28/2009  . PERIPHERAL EDEMA 10/06/2007  . PVD 10/06/2007  . Unspecified essential hypertension 10/19/2007    Family History  Problem Relation Age of Onset  . Ovarian cancer Mother   . Cancer Mother   . Leukemia Father   . Lung cancer Father   . Cancer Father   . Cancer Brother   . Diabetes Neg Hx   . Heart disease Neg Hx   . Hypertension Neg Hx     Past  Surgical History:  Procedure Laterality Date  . ABDOMINAL HYSTERECTOMY    . BUNIONECTOMY     bilat  . CHOLECYSTECTOMY  01/29/2016   at [facility]  . ENDOSCOPIC RETROGRADE CHOLANGIOPANCREATOGRAPHY (ERCP) WITH PROPOFOL N/A 12/01/2015   Procedure: ENDOSCOPIC RETROGRADE CHOLANGIOPANCREATOGRAPHY (ERCP) WITH PROPOFOL;  Surgeon: [name], MD;  Location: [facility];  Service: Endoscopy;  Laterality: N/A;  . ENDOSCOPIC RETROGRADE CHOLANGIOPANCREATOGRAPHY (ERCP) WITH PROPOFOL N/A 02/01/2016   Procedure: ENDOSCOPIC RETROGRADE CHOLANGIOPANCREATOGRAPHY (ERCP) WITH PROPOFOL;  Surgeon: [name], MD;  Location: [facility];  Service: Endoscopy;  Laterality: N/A;  . HAMMER TOE SURGERY    . Hyperplastic colon polyps, removed  2007   By Dr. Penelope Coop  . JOINT REPLACEMENT  2011   right knee  . OOPHORECTOMY    . right hip replacement    . ROTATOR CUFF REPAIR  2004   left (Dr. Durward Fortes)  . TOTAL HIP ARTHROPLASTY Right 11/26/2016   Procedure: RIGHT TOTAL HIP ARTHROPLASTY;  Surgeon: Garald Balding, MD;  Location: [place];  Service: Orthopedics;  Laterality: Right;  . TOTAL KNEE ARTHROPLASTY Right    Social History   Occupational History  . Network engineer Retired    retired   Social History Main Topics  . Smoking status: Never Smoker  . Smokeless tobacco: Never Used  . Alcohol use No  . Drug use: No  . Sexual activity: Not Currently

## 2017-01-24 NOTE — Telephone Encounter (Signed)
Please advise 

## 2017-01-24 NOTE — Telephone Encounter (Signed)
Charise from [employer] called on behalf of this patient.  Charise wants to know if the patient still needs to posterior  Hip precaustions.  Charise's Cb#[phone].

## 2017-01-24 NOTE — Addendum Note (Signed)
Addended by: Shona Needles on: 01/24/2017 02:31 PM   Modules accepted: Orders

## 2017-01-27 NOTE — Telephone Encounter (Signed)
Yes, 3 months

## 2017-01-27 NOTE — Telephone Encounter (Signed)
[email or web address]

## 2017-01-28 ENCOUNTER — Encounter: Payer: Self-pay | Admitting: Physical Therapy

## 2017-01-28 ENCOUNTER — Ambulatory Visit: Payer: Medicare Other | Attending: Orthopaedic Surgery | Admitting: Physical Therapy

## 2017-01-28 DIAGNOSIS — M25551 Pain in right hip: Secondary | ICD-10-CM | POA: Insufficient documentation

## 2017-01-28 DIAGNOSIS — R2241 Localized swelling, mass and lump, right lower limb: Secondary | ICD-10-CM | POA: Insufficient documentation

## 2017-01-28 DIAGNOSIS — R262 Difficulty in walking, not elsewhere classified: Secondary | ICD-10-CM | POA: Insufficient documentation

## 2017-01-28 DIAGNOSIS — M25651 Stiffness of right hip, not elsewhere classified: Secondary | ICD-10-CM | POA: Insufficient documentation

## 2017-01-28 NOTE — Therapy (Signed)
[facility] [address] [place], [place], [address] Phone: [phone]   Fax:  [phone]  Physical Therapy Evaluation  Patient Details  Name: Claudia Lara MRN: [id number] Date of Birth: Oct 01, 1932 Referring Provider: Durward Fortes  Encounter Date: 01/28/2017      PT End of Session - 01/28/17 1552    Visit Number 1   Date for PT Re-Evaluation 03/30/17   PT Start Time [id number]   PT Stop Time 1630   PT Time Calculation (min) 60 min   Activity Tolerance Patient tolerated treatment well   Behavior During Therapy [facility] for tasks assessed/performed      Past Medical History:  Diagnosis Date  . ANEMIA 08/22/2009  . CAROTID ARTERY STENOSIS 01/16/2010  . Complication of anesthesia    very hard to wake up from surgery  . DYSPNEA ON EXERTION 11/16/2007  . Gallstones   . GEN OSTEOARTHROSIS INVOLVING MULTIPLE SITES 11/11/2008  . GERD (gastroesophageal reflux disease)   . HEMORRHOIDS, INTERNAL    surgery was in the 90's (per patient)  . NECK PAIN, ACUTE 12/28/2009  . PERIPHERAL EDEMA 10/06/2007  . PVD 10/06/2007  . Unspecified essential hypertension 10/19/2007    Past Surgical History:  Procedure Laterality Date  . ABDOMINAL HYSTERECTOMY    . BUNIONECTOMY     bilat  . CHOLECYSTECTOMY  01/29/2016   at [facility]  . ENDOSCOPIC RETROGRADE CHOLANGIOPANCREATOGRAPHY (ERCP) WITH PROPOFOL N/A 12/01/2015   Procedure: ENDOSCOPIC RETROGRADE CHOLANGIOPANCREATOGRAPHY (ERCP) WITH PROPOFOL;  Surgeon: [name], MD;  Location: [facility];  Service: Endoscopy;  Laterality: N/A;  . ENDOSCOPIC RETROGRADE CHOLANGIOPANCREATOGRAPHY (ERCP) WITH PROPOFOL N/A 02/01/2016   Procedure: ENDOSCOPIC RETROGRADE CHOLANGIOPANCREATOGRAPHY (ERCP) WITH PROPOFOL;  Surgeon: [name], MD;  Location: [facility];  Service: Endoscopy;  Laterality: N/A;  . HAMMER TOE SURGERY    . Hyperplastic colon polyps, removed  2007   By Dr. Penelope Coop  .  JOINT REPLACEMENT  2011   right knee  . OOPHORECTOMY    . right hip replacement    . ROTATOR CUFF REPAIR  2004   left (Dr. Durward Fortes)  . TOTAL HIP ARTHROPLASTY Right 11/26/2016   Procedure: RIGHT TOTAL HIP ARTHROPLASTY;  Surgeon: Garald Balding, MD;  Location: [place];  Service: Orthopedics;  Laterality: Right;  . TOTAL KNEE ARTHROPLASTY Right     There were no vitals filed for this visit.       Subjective Assessment - 01/28/17 1531    Subjective Patient underwent a right THR 11/26/2016 posterior approach.  She was at [facility] for [facility], then went home and had home PT until last week.  She reports that she has had some problems with swelling and pain.   Limitations Standing;Walking   Patient Stated Goals have no pain, walk without device   Currently in Pain? Yes   Pain Score 1    Pain Location Knee   Pain Orientation Anterior;Right   Pain Descriptors / Indicators Sore   Pain Type Surgical pain   Pain Onset More than a month ago   Pain Frequency Intermittent   Aggravating Factors  reports pain has been only in the knee at times, she reports that she has not taken any pain medication, pain can be 3/10   Pain Relieving Factors reports most of the time no pain   Effect of Pain on Daily Activities difficulty with dressing, walking            [facility] PT  Assessment - 01/28/17 0001      Assessment   Medical Diagnosis s/p left THR   Referring Provider [place]   Onset Date/Surgical Date 11/26/16   Prior Therapy at SNF and at home     Precautions   Precautions Posterior Hip     Balance Screen   Has the patient fallen in the past 6 months No   Has the patient had a decrease in activity level because of a fear of falling?  No   Is the patient reluctant to leave their home because of a fear of falling?  No     Home Environment   Additional Comments a few steps into the home, does her own housework and yardwork     Prior Function   Level of Independence Independent    Vocation Retired   Leisure loves to do yardwork     ROM / Strength   AROM / PROM / Strength AROM;Strength     AROM   Overall AROM Comments AROM of the right knee 10-100 degrees flexion, hip flexion in standing 60 degrees, right hip ER was 5 degrees c/o tightness     Strength   Overall Strength Comments right hip strength is 4-/5, right knee 4/5     Palpation   Palpation comment has significant swellin in the right LE, has pitting edema in the right LE from the thigh to the foot     Ambulation/Gait   Gait Comments with SPC, slow, antalgic type on the right for the first few steps, decreased step phase ont he right     Standardized Balance Assessment   Standardized Balance Assessment Timed Up and Go Test     Timed Up and Go Test   Normal TUG (seconds) 23                   [facility] Adult PT Treatment/Exercise - 01/28/17 0001      Exercises   Exercises Knee/Hip     Knee/Hip Exercises: Aerobic   Nustep level 5 x 5 minutes                  PT Short Term Goals - 01/28/17 1555      PT SHORT TERM GOAL #1   Title independent with initial HEP   Time 2   Period Weeks   Status New           PT Long Term Goals - 01/28/17 1555      PT LONG TERM GOAL #1   Title walk iwthout device x 300 feet   Time 8   Period Weeks   Status New     PT LONG TERM GOAL #2   Title have no pain for all ADL's   Time 8   Period Weeks   Status New     PT LONG TERM GOAL #3   Title increase strength of the right hip to 4/5   Time 8   Period Weeks   Status New     PT LONG TERM GOAL #4   Title decrease TUG time to 16 seconds   Time 8   Period Weeks   Status New     PT LONG TERM GOAL #5   Title shop without difficulty   Time 8   Period Weeks   Status New               Plan - 01/28/17 1552    Clinical Impression Statement Patient reports that she  underwent a right THR 11/26/16, posterior approach.  She has had some issues with swelling.  She reports that she  is not walking well and is frustrated with that.  Her TUG time was 23 seconds, she uses a SPC, slow gait.   Rehab Potential Good   PT Frequency 2x / week   PT Duration 8 weeks   PT Treatment/Interventions ADLs/Self Care Home Management;Electrical Stimulation;Gait training;Functional mobility training;Stair training;Patient/family education;Balance training;Therapeutic exercise;Therapeutic activities;Manual techniques;Vasopneumatic Device   PT Next Visit Plan see if we can work on gait, some ROM and swelling   Consulted and Agree with Plan of Care Patient      Patient will benefit from skilled therapeutic intervention in order to improve the following deficits and impairments:  Abnormal gait, Cardiopulmonary status limiting activity, Decreased activity tolerance, Decreased balance, Decreased mobility, Decreased range of motion, Decreased strength, Increased edema, Difficulty walking, Impaired flexibility, Pain  Visit Diagnosis: Difficulty in walking, not elsewhere classified - Plan: PT plan of care cert/re-cert  Localized swelling, mass and lump, right lower limb - Plan: PT plan of care cert/re-cert  Stiffness of right hip, not elsewhere classified - Plan: PT plan of care cert/re-cert  Pain in right hip - Plan: PT plan of care cert/re-cert      G-Codes - [phone] [id number]    Functional Assessment Tool Used (Outpatient Only) foto 69% limitation   Functional Limitation Mobility: Walking and moving around   Mobility: Walking and Moving Around Current Status [phone]) At least 60 percent but less than 80 percent impaired, limited or restricted   Mobility: Walking and Moving Around Goal Status [phone]) At least 40 percent but less than 60 percent impaired, limited or restricted       Problem List Patient Active Problem List   Diagnosis Date Noted  . Localized edema 01/22/2017  . D-dimer, elevated 01/22/2017  . Unilateral primary osteoarthritis, right hip 11/26/2016  . S/P total hip  arthroplasty 11/26/2016  . GERD (gastroesophageal reflux disease) 08/03/2015  . Hemolytic anemia ([place]) 07/12/2015  . Chronic venous insufficiency 08/10/2014  . Aortic stenosis, mild 06/29/2014  . Left ventricular diastolic dysfunction, NYHA class 1 06/29/2014  . Senile osteopenia 04/21/2014  . Hyperlipidemia with target LDL less than 130 04/21/2014  . Kidney disease, chronic, stage III (GFR 30-59 ml/min) 04/21/2014  . Routine health maintenance 07/16/2012  . Carotid artery stenosis without cerebral infarction 01/16/2010  . Essential hypertension 10/19/2007    Sumner Boast., PT 01/28/2017, 4:08 PM  [address] [place], [place], [address] Phone: [phone]   Fax:  [phone]  Name: CHARNA FATHI MRN: [id number] Date of Birth: [birth year]

## 2017-01-29 ENCOUNTER — Telehealth (INDEPENDENT_AMBULATORY_CARE_PROVIDER_SITE_OTHER): Payer: Self-pay | Admitting: Orthopaedic Surgery

## 2017-01-29 NOTE — Telephone Encounter (Signed)
Claudia Lara, the home health physical therapist was returning Claudia Lara's call from last week and states they discharged patient on 01/24/2017 and [name] had called to give orders and Severiano Gilbert thinks maybe those orders were meant for OT? Also, She asked if patient had any hip precautions? Please advise.   Cb# [phone]

## 2017-01-31 ENCOUNTER — Ambulatory Visit: Payer: Medicare Other | Admitting: Physical Therapy

## 2017-01-31 ENCOUNTER — Other Ambulatory Visit: Payer: Self-pay | Admitting: Internal Medicine

## 2017-01-31 DIAGNOSIS — R2241 Localized swelling, mass and lump, right lower limb: Secondary | ICD-10-CM | POA: Diagnosis not present

## 2017-01-31 DIAGNOSIS — R600 Localized edema: Secondary | ICD-10-CM

## 2017-01-31 DIAGNOSIS — M25551 Pain in right hip: Secondary | ICD-10-CM | POA: Diagnosis not present

## 2017-01-31 DIAGNOSIS — M25651 Stiffness of right hip, not elsewhere classified: Secondary | ICD-10-CM

## 2017-01-31 DIAGNOSIS — R262 Difficulty in walking, not elsewhere classified: Secondary | ICD-10-CM

## 2017-01-31 DIAGNOSIS — R7989 Other specified abnormal findings of blood chemistry: Secondary | ICD-10-CM

## 2017-01-31 NOTE — Telephone Encounter (Signed)
Pt being seen for PT

## 2017-01-31 NOTE — Therapy (Signed)
[place], [place], [address] Phone: [phone]   Fax:  [phone]  Physical Therapy Treatment  Patient Details  Name: Claudia Lara MRN: [id number] Date of Birth: 23-Jun-1932 Referring Provider: Durward Fortes  Encounter Date: 01/31/2017      PT End of Session - 01/31/17 1215    Visit Number 2   Date for PT Re-Evaluation 03/30/17   PT Start Time 1015   PT Stop Time 1100   PT Time Calculation (min) 45 min   Activity Tolerance Patient tolerated treatment well   Behavior During Therapy [facility] for tasks assessed/performed      Past Medical History:  Diagnosis Date   ANEMIA 08/22/2009   CAROTID ARTERY STENOSIS 7/62/8315   Complication of anesthesia    very hard to wake up from surgery   DYSPNEA ON EXERTION 11/16/2007   Gallstones    GEN OSTEOARTHROSIS INVOLVING MULTIPLE SITES 11/11/2008   GERD (gastroesophageal reflux disease)    HEMORRHOIDS, INTERNAL    surgery was in the 90's (per patient)   NECK PAIN, ACUTE 12/28/2009   PERIPHERAL EDEMA 10/06/2007   PVD 10/06/2007   Unspecified essential hypertension 10/19/2007    Past Surgical History:  Procedure Laterality Date   ABDOMINAL HYSTERECTOMY     BUNIONECTOMY     bilat   CHOLECYSTECTOMY  01/29/2016   at [place] (ERCP) WITH PROPOFOL N/A 12/01/2015   Procedure: ENDOSCOPIC RETROGRADE CHOLANGIOPANCREATOGRAPHY (ERCP) WITH PROPOFOL;  Surgeon: [name], MD;  Location: [facility];  Service: Endoscopy;  Laterality: N/A;   ENDOSCOPIC RETROGRADE CHOLANGIOPANCREATOGRAPHY (ERCP) WITH PROPOFOL N/A 02/01/2016   Procedure: ENDOSCOPIC RETROGRADE CHOLANGIOPANCREATOGRAPHY (ERCP) WITH PROPOFOL;  Surgeon: [name], MD;  [facility];  Service: Endoscopy;  Laterality: N/A;   HAMMER TOE SURGERY     Hyperplastic colon polyps, removed  2007   By Dr. Penelope Coop    JOINT REPLACEMENT  2011   right knee   OOPHORECTOMY     right hip replacement     ROTATOR CUFF REPAIR  2004   left (Dr. Durward Fortes)   TOTAL HIP ARTHROPLASTY Right 11/26/2016   Procedure: RIGHT TOTAL HIP ARTHROPLASTY;  Surgeon: Garald Balding, MD;  Location: [facility];  Service: Orthopedics;  Laterality: Right;   TOTAL KNEE ARTHROPLASTY Right     There were no vitals filed for this visit.      Subjective Assessment - 01/31/17 1212    Subjective No C/O hip pain, but notes that she cont to have weakness throughout her Right LE.  Using cane for general ambulation d/t Right LE weakness.  Feels Right LE doesn't do what she wants it to sometimes.   Currently in Pain? No/denies                         [facility] Adult PT Treatment/Exercise - 01/31/17 0001      Ambulation/Gait   Gait Comments with SPC, slow, antalgic type on the right for the first few steps, decreased step phase ont he right     Knee/Hip Exercises: Aerobic   Recumbent Bike 6 mins     Knee/Hip Exercises: Standing   Hip Flexion AROM;Stengthening;Left;2 sets;15 reps   Hip ADduction AROM;Strengthening;Both;1 set;10 reps   Hip Extension AROM;Stengthening;Both;1 set;10 reps   Wall Squat 2 sets;10 reps     Knee/Hip Exercises: Seated   Long Arc Quad AROM;Strengthening;Right;2 sets;10 reps;Weights   Long Arc  Quad Weight 5 lbs.   Hamstring Curl AROM;Strengthening;Right;2 sets;10 reps;Other (comment)   Hamstring Limitations blue band   Sit to Sand 2 sets;10 reps     Knee/Hip Exercises: Supine   Short Arc Quad Sets AROM;Strengthening;Right;2 sets;10 reps   Short Arc Quad Sets Limitations 5#   Bridges Limitations 2x10   Other Supine Knee/Hip Exercises pball bridge 2x10                  PT Short Term Goals - 01/28/17 1555      PT SHORT TERM GOAL #1   Title independent with initial HEP   Time 2   Period Weeks   Status New           PT Long Term Goals - 01/28/17 1555      PT LONG TERM  GOAL #1   Title walk iwthout device x 300 feet   Time 8   Period Weeks   Status New     PT LONG TERM GOAL #2   Title have no pain for all ADL's   Time 8   Period Weeks   Status New     PT LONG TERM GOAL #3   Title increase strength of the right hip to 4/5   Time 8   Period Weeks   Status New     PT LONG TERM GOAL #4   Title decrease TUG time to 16 seconds   Time 8   Period Weeks   Status New     PT LONG TERM GOAL #5   Title shop without difficulty   Time 8   Period Weeks   Status New               Plan - 01/31/17 1217    Clinical Impression Statement Tolerates treatment without complaint.  Demos decreased hip/ knee extension synergy despite ability to individually extend both her knee and her hip.  The result is a flexed trunk type gait that lack TKE at any pint during the gait cycle.        Patient will benefit from skilled therapeutic intervention in order to improve the following deficits and impairments:     Visit Diagnosis: Difficulty in walking, not elsewhere classified  Localized swelling, mass and lump, right lower limb  Stiffness of right hip, not elsewhere classified  Pain in right hip     Problem List Patient Active Problem List   Diagnosis Date Noted   Localized edema 01/22/2017   D-dimer, elevated 01/22/2017   Unilateral primary osteoarthritis, right hip 11/26/2016   S/P total hip arthroplasty 11/26/2016   GERD (gastroesophageal reflux disease) 08/03/2015   Hemolytic anemia ([place]) 07/12/2015   Chronic venous insufficiency 08/10/2014   Aortic stenosis, mild 06/29/2014   Left ventricular diastolic dysfunction, NYHA class 1 06/29/2014   Senile osteopenia 04/21/2014   Hyperlipidemia with target LDL less than 130 04/21/2014   Kidney disease, chronic, stage III (GFR 30-59 ml/min) 04/21/2014   Routine health maintenance 07/16/2012   Carotid artery stenosis without cerebral infarction 01/16/2010   Essential hypertension  10/19/2007    Olean Ree, PTA 01/31/2017, 12:18 PM  [place] [address] [place], [place], [address] Phone: [phone]   Fax:  [phone]  Name: Claudia Lara MRN: [id number] Date of Birth: 08-02-1932

## 2017-02-03 ENCOUNTER — Ambulatory Visit (HOSPITAL_COMMUNITY): Payer: Medicare Other

## 2017-02-04 ENCOUNTER — Ambulatory Visit: Payer: Medicare Other | Admitting: Physical Therapy

## 2017-02-05 ENCOUNTER — Ambulatory Visit (HOSPITAL_COMMUNITY)
Admission: RE | Admit: 2017-02-05 | Discharge: 2017-02-05 | Disposition: A | Discharge status: Home / Self Care | Payer: Medicare Other | Source: Ambulatory Visit | Attending: Internal Medicine | Admitting: Internal Medicine

## 2017-02-05 DIAGNOSIS — R600 Localized edema: Secondary | ICD-10-CM | POA: Diagnosis not present

## 2017-02-05 DIAGNOSIS — R7989 Other specified abnormal findings of blood chemistry: Secondary | ICD-10-CM | POA: Diagnosis not present

## 2017-02-06 ENCOUNTER — Encounter: Payer: Self-pay | Admitting: Physical Therapy

## 2017-02-06 ENCOUNTER — Ambulatory Visit: Payer: Medicare Other | Admitting: Physical Therapy

## 2017-02-06 ENCOUNTER — Other Ambulatory Visit: Payer: Self-pay | Admitting: Internal Medicine

## 2017-02-06 DIAGNOSIS — M25551 Pain in right hip: Secondary | ICD-10-CM

## 2017-02-06 DIAGNOSIS — R262 Difficulty in walking, not elsewhere classified: Secondary | ICD-10-CM

## 2017-02-06 DIAGNOSIS — M25651 Stiffness of right hip, not elsewhere classified: Secondary | ICD-10-CM | POA: Diagnosis not present

## 2017-02-06 DIAGNOSIS — R2241 Localized swelling, mass and lump, right lower limb: Secondary | ICD-10-CM | POA: Diagnosis not present

## 2017-02-06 NOTE — Therapy (Signed)
[place], [place], [address] Phone: [phone]   Fax:  [phone]  Physical Therapy Treatment  Patient Details  Name: Claudia Lara MRN: [id number] Date of Birth: 1932-10-04 Referring Provider: Durward Fortes  Encounter Date: 02/06/2017      PT End of Session - 02/06/17 1126    Visit Number 3   Date for PT Re-Evaluation 03/30/17   PT Start Time 1050   PT Stop Time 1130   PT Time Calculation (min) 40 min   Activity Tolerance Patient tolerated treatment well   Behavior During Therapy [facility] for tasks assessed/performed      Past Medical History:  Diagnosis Date  . ANEMIA 08/22/2009  . CAROTID ARTERY STENOSIS 01/16/2010  . Complication of anesthesia    very hard to wake up from surgery  . DYSPNEA ON EXERTION 11/16/2007  . Gallstones   . GEN OSTEOARTHROSIS INVOLVING MULTIPLE SITES 11/11/2008  . GERD (gastroesophageal reflux disease)   . HEMORRHOIDS, INTERNAL    surgery was in the 90's (per patient)  . NECK PAIN, ACUTE 12/28/2009  . PERIPHERAL EDEMA 10/06/2007  . PVD 10/06/2007  . Unspecified essential hypertension 10/19/2007    Past Surgical History:  Procedure Laterality Date  . ABDOMINAL HYSTERECTOMY    . BUNIONECTOMY     bilat  . CHOLECYSTECTOMY  01/29/2016   at [facility]  . ENDOSCOPIC RETROGRADE CHOLANGIOPANCREATOGRAPHY (ERCP) WITH PROPOFOL N/A 12/01/2015   Procedure: ENDOSCOPIC RETROGRADE CHOLANGIOPANCREATOGRAPHY (ERCP) WITH PROPOFOL;  Surgeon: [name], MD;  [facility];  Service: Endoscopy;  Laterality: N/A;  . ENDOSCOPIC RETROGRADE CHOLANGIOPANCREATOGRAPHY (ERCP) WITH PROPOFOL N/A 02/01/2016   Procedure: ENDOSCOPIC RETROGRADE CHOLANGIOPANCREATOGRAPHY (ERCP) WITH PROPOFOL;  Surgeon: [name], MD;  [facility];  Service: Endoscopy;  Laterality: N/A;  . HAMMER TOE SURGERY    . Hyperplastic colon polyps, removed  2007   By Dr. Penelope Coop  .  JOINT REPLACEMENT  2011   right knee  . OOPHORECTOMY    . right hip replacement    . ROTATOR CUFF REPAIR  2004   left (Dr. Durward Fortes)  . TOTAL HIP ARTHROPLASTY Right 11/26/2016   Procedure: RIGHT TOTAL HIP ARTHROPLASTY;  Surgeon: Garald Balding, MD;  Location: [facility];  Service: Orthopedics;  Laterality: Right;  . TOTAL KNEE ARTHROPLASTY Right     There were no vitals filed for this visit.      Subjective Assessment - 02/06/17 1050    Subjective Pt reports that she is doing good   Currently in Pain? No/denies   Pain Score 0-No pain                         [facility] Adult PT Treatment/Exercise - 02/06/17 0001      Knee/Hip Exercises: Aerobic   Nustep level 4 x 6 minutes     Knee/Hip Exercises: Machines for Strengthening   Other Machine fitter presses onel black RLE 2x15     Knee/Hip Exercises: Standing   Hip ADduction AROM;Strengthening;Both;1 set;10 reps   Hip Extension AROM;Stengthening;Both;1 set;10 reps   Forward Step Up 2 sets;10 reps;Hand Hold: 2;Step Height: 4"     Knee/Hip Exercises: Seated   Long Arc Quad AROM;Strengthening;Right;2 sets;10 reps;Weights   Long Arc Quad Weight 5 lbs.   Ball Squeeze x20   Other Seated Knee/Hip Exercises RLE 2x10 march   Marching Weights 5 lbs.   Hamstring Curl AROM;Strengthening;Right;2 sets;10 reps;Other (comment)  Hamstring Limitations blue band   Sit to Sand 2 sets;10 reps;with UE support                PT Education - 02/06/17 1129    Education provided Yes   Education Details HEP, sit to stand, Hip seated add squeeze, seated hip abd, bridges   Person(s) Educated Patient   Methods Explanation;Tactile cues;Handout;Demonstration   Comprehension Verbalized understanding          PT Short Term Goals - 01/28/17 1555      PT SHORT TERM GOAL #1   Title independent with initial HEP   Time 2   Period Weeks   Status New           PT Long Term Goals - 02/06/17 1128      PT LONG TERM GOAL #1    Title walk iwthout device x 300 feet   Status On-going     PT LONG TERM GOAL #2   Title have no pain for all ADL's   Status On-going     PT LONG TERM GOAL #3   Title increase strength of the right hip to 4/5   Status On-going               Plan - 02/06/17 1127    Clinical Impression Statement Pt able to perform all of today's intervention without c/o of pain. Better demo of hip and knee synergy with seated fitter presses. Amb with a flexed trunk.   Rehab Potential Good   PT Frequency 2x / week   PT Duration 8 weeks   PT Treatment/Interventions ADLs/Self Care Home Management;Electrical Stimulation;Gait training;Functional mobility training;Stair training;Patient/family education;Balance training;Therapeutic exercise;Therapeutic activities;Manual techniques;Vasopneumatic Device   PT Next Visit Plan see if we can work on gait, some ROM and swelling      Patient will benefit from skilled therapeutic intervention in order to improve the following deficits and impairments:  Abnormal gait, Cardiopulmonary status limiting activity, Decreased activity tolerance, Decreased balance, Decreased mobility, Decreased range of motion, Decreased strength, Increased edema, Difficulty walking, Impaired flexibility, Pain  Visit Diagnosis: Difficulty in walking, not elsewhere classified  Localized swelling, mass and lump, right lower limb  Stiffness of right hip, not elsewhere classified  Pain in right hip     Problem List Patient Active Problem List   Diagnosis Date Noted  . Localized edema 01/22/2017  . D-dimer, elevated 01/22/2017  . Unilateral primary osteoarthritis, right hip 11/26/2016  . S/P total hip arthroplasty 11/26/2016  . GERD (gastroesophageal reflux disease) 08/03/2015  . Hemolytic anemia ([place]) 07/12/2015  . Chronic venous insufficiency 08/10/2014  . Aortic stenosis, mild 06/29/2014  . Left ventricular diastolic dysfunction, NYHA class 1 06/29/2014  . Senile  osteopenia 04/21/2014  . Hyperlipidemia with target LDL less than 130 04/21/2014  . Kidney disease, chronic, stage III (GFR 30-59 ml/min) 04/21/2014  . Routine health maintenance 07/16/2012  . Carotid artery stenosis without cerebral infarction 01/16/2010  . Essential hypertension 10/19/2007    Scot Jun 02/06/2017, 11:29 AM  [place] [address] [place], [place], [address] Phone: [phone]   Fax:  [phone]  Name: Claudia Lara MRN: [id number] Date of Birth: [birth year]

## 2017-02-11 ENCOUNTER — Encounter: Payer: Self-pay | Admitting: Physical Therapy

## 2017-02-11 ENCOUNTER — Ambulatory Visit: Payer: Medicare Other | Admitting: Physical Therapy

## 2017-02-11 DIAGNOSIS — R262 Difficulty in walking, not elsewhere classified: Secondary | ICD-10-CM | POA: Diagnosis not present

## 2017-02-11 DIAGNOSIS — R2241 Localized swelling, mass and lump, right lower limb: Secondary | ICD-10-CM

## 2017-02-11 DIAGNOSIS — M25551 Pain in right hip: Secondary | ICD-10-CM | POA: Diagnosis not present

## 2017-02-11 DIAGNOSIS — M25651 Stiffness of right hip, not elsewhere classified: Secondary | ICD-10-CM | POA: Diagnosis not present

## 2017-02-11 NOTE — Therapy (Signed)
[place], [place], [address] Phone: [phone]   Fax:  [phone]  Physical Therapy Treatment  Patient Details  Name: Claudia Lara MRN: [id number] Date of Birth: Feb 14, 1932 Referring Provider: Durward Fortes  Encounter Date: 02/11/2017      PT End of Session - 02/11/17 1056    Visit Number 4   Date for PT Re-Evaluation 03/30/17   PT Start Time 6720   PT Stop Time 1058   PT Time Calculation (min) 43 min   Activity Tolerance Patient tolerated treatment well   Behavior During Therapy [facility] for tasks assessed/performed      Past Medical History:  Diagnosis Date  . ANEMIA 08/22/2009  . CAROTID ARTERY STENOSIS 01/16/2010  . Complication of anesthesia    very hard to wake up from surgery  . DYSPNEA ON EXERTION 11/16/2007  . Gallstones   . GEN OSTEOARTHROSIS INVOLVING MULTIPLE SITES 11/11/2008  . GERD (gastroesophageal reflux disease)   . HEMORRHOIDS, INTERNAL    surgery was in the 90's (per patient)  . NECK PAIN, ACUTE 12/28/2009  . PERIPHERAL EDEMA 10/06/2007  . PVD 10/06/2007  . Unspecified essential hypertension 10/19/2007    Past Surgical History:  Procedure Laterality Date  . ABDOMINAL HYSTERECTOMY    . BUNIONECTOMY     bilat  . CHOLECYSTECTOMY  01/29/2016   at [facility]  . ENDOSCOPIC RETROGRADE CHOLANGIOPANCREATOGRAPHY (ERCP) WITH PROPOFOL N/A 12/01/2015   Procedure: ENDOSCOPIC RETROGRADE CHOLANGIOPANCREATOGRAPHY (ERCP) WITH PROPOFOL;  Surgeon: [name], MD;  [facility];  Service: Endoscopy;  Laterality: N/A;  . ENDOSCOPIC RETROGRADE CHOLANGIOPANCREATOGRAPHY (ERCP) WITH PROPOFOL N/A 02/01/2016   Procedure: ENDOSCOPIC RETROGRADE CHOLANGIOPANCREATOGRAPHY (ERCP) WITH PROPOFOL;  Surgeon: [name], MD;  [facility];  Service: Endoscopy;  Laterality: N/A;  . HAMMER TOE SURGERY    . Hyperplastic colon polyps, removed  2007   By Dr. Penelope Coop  .  JOINT REPLACEMENT  2011   right knee  . OOPHORECTOMY    . right hip replacement    . ROTATOR CUFF REPAIR  2004   left (Dr. Durward Fortes)  . TOTAL HIP ARTHROPLASTY Right 11/26/2016   Procedure: RIGHT TOTAL HIP ARTHROPLASTY;  Surgeon: Garald Balding, MD;  Location: [facility];  Service: Orthopedics;  Laterality: Right;  . TOTAL KNEE ARTHROPLASTY Right     There were no vitals filed for this visit.      Subjective Assessment - 02/11/17 1016    Subjective "All right, see I forget if something is on the floor I will reach down and get it"   Currently in Pain? No/denies   Pain Score 0-No pain                         [facility] Adult PT Treatment/Exercise - 02/11/17 0001      Knee/Hip Exercises: Aerobic   Nustep level 4 x 7 minutes     Knee/Hip Exercises: Machines for Strengthening   Cybex Knee Extension 5lb 2x10 RLE   Cybex Knee Flexion 15lb 3x10 RLE    Cybex Leg Press 20lb 2x10   Other Machine fitter presses onel black 1 blue RLE 2x15     Knee/Hip Exercises: Standing   Hip Abduction Both;2 sets;10 reps;Knee straight   Hip Extension AROM;Stengthening;Both;10 reps;2 sets   Forward Step Up 2 sets;10 reps;Hand Hold: 2;Step Height: 4"   Other Standing Knee Exercises sit to stand x10 UE use from regular chair  PT Short Term Goals - 01/28/17 1555      PT SHORT TERM GOAL #1   Title independent with initial HEP   Time 2   Period Weeks   Status New           PT Long Term Goals - 02/11/17 1056      PT LONG TERM GOAL #1   Title walk iwthout device x 300 feet   Status On-going     PT LONG TERM GOAL #2   Title have no pain for all ADL's   Status On-going     PT LONG TERM GOAL #3   Title increase strength of the right hip to 4/5   Status On-going     PT LONG TERM GOAL #4   Title decrease TUG time to 16 seconds   Status On-going     PT LONG TERM GOAL #5   Title shop without difficulty   Status On-going               Plan -  02/11/17 1057    Clinical Impression Statement Reinforced posterior hip precautions to pt. Pt tolerated all exercises well, does require constant cues not to jump with step up rather to push down through RLE when stepping. RLE fatigues quick with single leg extensions.    Rehab Potential Good   PT Frequency 2x / week   PT Duration 8 weeks   PT Treatment/Interventions ADLs/Self Care Home Management;Electrical Stimulation;Gait training;Functional mobility training;Stair training;Patient/family education;Balance training;Therapeutic exercise;Therapeutic activities;Manual techniques;Vasopneumatic Device   PT Next Visit Plan see if we can work on gait, some ROM and swelling      Patient will benefit from skilled therapeutic intervention in order to improve the following deficits and impairments:  Abnormal gait, Cardiopulmonary status limiting activity, Decreased activity tolerance, Decreased balance, Decreased mobility, Decreased range of motion, Decreased strength, Increased edema, Difficulty walking, Impaired flexibility, Pain  Visit Diagnosis: Difficulty in walking, not elsewhere classified  Localized swelling, mass and lump, right lower limb  Stiffness of right hip, not elsewhere classified  Pain in right hip     Problem List Patient Active Problem List   Diagnosis Date Noted  . Localized edema 01/22/2017  . D-dimer, elevated 01/22/2017  . Unilateral primary osteoarthritis, right hip 11/26/2016  . S/P total hip arthroplasty 11/26/2016  . GERD (gastroesophageal reflux disease) 08/03/2015  . Hemolytic anemia ([place]) 07/12/2015  . Chronic venous insufficiency 08/10/2014  . Aortic stenosis, mild 06/29/2014  . Left ventricular diastolic dysfunction, NYHA class 1 06/29/2014  . Senile osteopenia 04/21/2014  . Hyperlipidemia with target LDL less than 130 04/21/2014  . Kidney disease, chronic, stage III (GFR 30-59 ml/min) 04/21/2014  . Routine health maintenance 07/16/2012  . Carotid  artery stenosis without cerebral infarction 01/16/2010  . Essential hypertension 10/19/2007    Scot Jun 02/11/2017, 10:59 AM  [place] [address] [place], [place], [address] Phone: [phone]   Fax:  [phone]  Name: DALESHA STANBACK MRN: [id number] Date of Birth: Dec 31, 1931

## 2017-02-14 ENCOUNTER — Ambulatory Visit: Payer: Medicare Other | Admitting: Physical Therapy

## 2017-02-14 ENCOUNTER — Encounter: Payer: Self-pay | Admitting: Physical Therapy

## 2017-02-14 DIAGNOSIS — M25651 Stiffness of right hip, not elsewhere classified: Secondary | ICD-10-CM | POA: Diagnosis not present

## 2017-02-14 DIAGNOSIS — R2241 Localized swelling, mass and lump, right lower limb: Secondary | ICD-10-CM

## 2017-02-14 DIAGNOSIS — M25551 Pain in right hip: Secondary | ICD-10-CM | POA: Diagnosis not present

## 2017-02-14 DIAGNOSIS — R262 Difficulty in walking, not elsewhere classified: Secondary | ICD-10-CM

## 2017-02-14 NOTE — Therapy (Signed)
[place] [address] [place], [place], [address] Phone: [phone]   Fax:  [phone]  Physical Therapy Treatment  Patient Details  Name: Claudia Lara MRN: [id number] Date of Birth: [birth year] Referring Provider: Durward Fortes  Encounter Date: 02/14/2017      PT End of Session - 02/14/17 1130    Visit Number 5   Date for PT Re-Evaluation 03/30/17   PT Start Time 1050   PT Stop Time 1140   PT Time Calculation (min) 50 min      Past Medical History:  Diagnosis Date  . ANEMIA 08/22/2009  . CAROTID ARTERY STENOSIS 01/16/2010  . Complication of anesthesia    very hard to wake up from surgery  . DYSPNEA ON EXERTION 11/16/2007  . Gallstones   . GEN OSTEOARTHROSIS INVOLVING MULTIPLE SITES 11/11/2008  . GERD (gastroesophageal reflux disease)   . HEMORRHOIDS, INTERNAL    surgery was in the 90's (per patient)  . NECK PAIN, ACUTE 12/28/2009  . PERIPHERAL EDEMA 10/06/2007  . PVD 10/06/2007  . Unspecified essential hypertension 10/19/2007    Past Surgical History:  Procedure Laterality Date  . ABDOMINAL HYSTERECTOMY    . BUNIONECTOMY     bilat  . CHOLECYSTECTOMY  01/29/2016   at [facility]  . ENDOSCOPIC RETROGRADE CHOLANGIOPANCREATOGRAPHY (ERCP) WITH PROPOFOL N/A 12/01/2015   Procedure: ENDOSCOPIC RETROGRADE CHOLANGIOPANCREATOGRAPHY (ERCP) WITH PROPOFOL;  Surgeon: [name], MD;  [facility];  Service: Endoscopy;  Laterality: N/A;  . ENDOSCOPIC RETROGRADE CHOLANGIOPANCREATOGRAPHY (ERCP) WITH PROPOFOL N/A 02/01/2016   Procedure: ENDOSCOPIC RETROGRADE CHOLANGIOPANCREATOGRAPHY (ERCP) WITH PROPOFOL;  Surgeon: [name], MD;  [facility];  Service: Endoscopy;  Laterality: N/A;  . HAMMER TOE SURGERY    . Hyperplastic colon polyps, removed  2007   By Dr. Penelope Coop  . JOINT REPLACEMENT  2011   right knee  . OOPHORECTOMY    . right hip replacement    . ROTATOR CUFF REPAIR  2004    left (Dr. Durward Fortes)  . TOTAL HIP ARTHROPLASTY Right 11/26/2016   Procedure: RIGHT TOTAL HIP ARTHROPLASTY;  Surgeon: Garald Balding, MD;  Location: [facility];  Service: Orthopedics;  Laterality: Right;  . TOTAL KNEE ARTHROPLASTY Right     There were no vitals filed for this visit.      Subjective Assessment - 02/14/17 1046    Subjective I am so ready to get better/stronger   Currently in Pain? No/denies                         [facility] Adult PT Treatment/Exercise - 02/14/17 0001      High Level Balance   High Level Balance Activities Backward walking;Marching forwards;Side stepping  red tband HHA     Knee/Hip Exercises: Aerobic   Recumbent Bike 5 min   Nustep level 5 x 6 minutes  legs only     Knee/Hip Exercises: Machines for Strengthening   Cybex Knee Extension 5lb 2x10 RLE   Cybex Knee Flexion 15lb 3x10 RLE    Cybex Leg Press 20lb 2 sets 15     Knee/Hip Exercises: Standing   Other Standing Knee Exercises pulleys hip flex,ext and abd   15 each 5#     Knee/Hip Exercises: Seated   Ball Squeeze x20   Other Seated Knee/Hip Exercises RLE 2x10 march and abd  blue band  PT Short Term Goals - 02/14/17 1126      PT SHORT TERM GOAL #1   Title independent with initial HEP   Status Achieved           PT Long Term Goals - 02/14/17 1126      PT LONG TERM GOAL #1   Title walk iwthout device x 300 feet   Baseline walk about 200 feet but needs SPC   Status On-going     PT LONG TERM GOAL #2   Title have no pain for all ADL's   Status On-going     PT LONG TERM GOAL #3   Title increase strength of the right hip to 4/5   Status On-going     PT LONG TERM GOAL #4   Title decrease TUG time to 16 seconds   Baseline TUG without AD 14 sec   Status Achieved     PT LONG TERM GOAL #5   Title shop without difficulty   Status On-going               Plan - 02/14/17 1130    Clinical Impression Statement pt tolerated  increased reps and wt no no pain just weakness and fatigue. progressing with goals   PT Next Visit Plan gait/ROM and strength      Patient will benefit from skilled therapeutic intervention in order to improve the following deficits and impairments:  Abnormal gait, Cardiopulmonary status limiting activity, Decreased activity tolerance, Decreased balance, Decreased mobility, Decreased range of motion, Decreased strength, Increased edema, Difficulty walking, Impaired flexibility, Pain  Visit Diagnosis: Difficulty in walking, not elsewhere classified  Localized swelling, mass and lump, right lower limb  Stiffness of right hip, not elsewhere classified     Problem List Patient Active Problem List   Diagnosis Date Noted  . Localized edema 01/22/2017  . D-dimer, elevated 01/22/2017  . Unilateral primary osteoarthritis, right hip 11/26/2016  . S/P total hip arthroplasty 11/26/2016  . GERD (gastroesophageal reflux disease) 08/03/2015  . Hemolytic anemia ([place]) 07/12/2015  . Chronic venous insufficiency 08/10/2014  . Aortic stenosis, mild 06/29/2014  . Left ventricular diastolic dysfunction, NYHA class 1 06/29/2014  . Senile osteopenia 04/21/2014  . Hyperlipidemia with target LDL less than 130 04/21/2014  . Kidney disease, chronic, stage III (GFR 30-59 ml/min) 04/21/2014  . Routine health maintenance 07/16/2012  . Carotid artery stenosis without cerebral infarction 01/16/2010  . Essential hypertension 10/19/2007    Majel Giel,ANGIE  PTA 02/14/2017, 11:32 AM  [address] [address] [place], [place], [address] Phone: [phone]   Fax:  [phone]  Name: AHJANAE CASSEL MRN: [id number] Date of Birth: 1932-09-17

## 2017-02-17 ENCOUNTER — Encounter: Payer: Self-pay | Admitting: Internal Medicine

## 2017-02-17 ENCOUNTER — Ambulatory Visit (INDEPENDENT_AMBULATORY_CARE_PROVIDER_SITE_OTHER): Payer: Medicare Other | Admitting: Internal Medicine

## 2017-02-17 VITALS — BP 150/50 | HR 77 | Temp 97.8°F | Resp 16 | Ht 63.5 in | Wt 168.8 lb

## 2017-02-17 DIAGNOSIS — I10 Essential (primary) hypertension: Secondary | ICD-10-CM

## 2017-02-17 DIAGNOSIS — I350 Nonrheumatic aortic (valve) stenosis: Secondary | ICD-10-CM

## 2017-02-17 IMAGING — US US ABDOMEN COMPLETE
1 series · 13 of 25 positions shown · non-contrast
Comparison: None.

CLINICAL DATA: Epigastric pain.

EXAM:
ABDOMEN ULTRASOUND COMPLETE

[Series 1: us abdomen complete · 0.32mm/px · 13 of 74 slices shown]
[im 1/74]
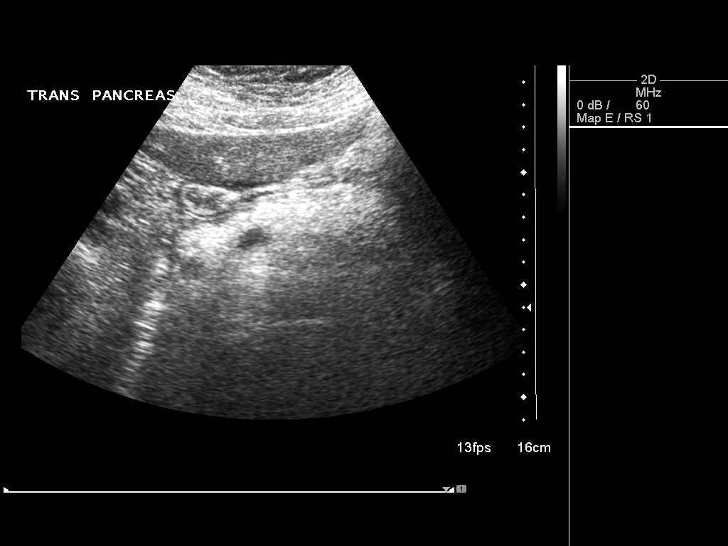
[im 7/74]
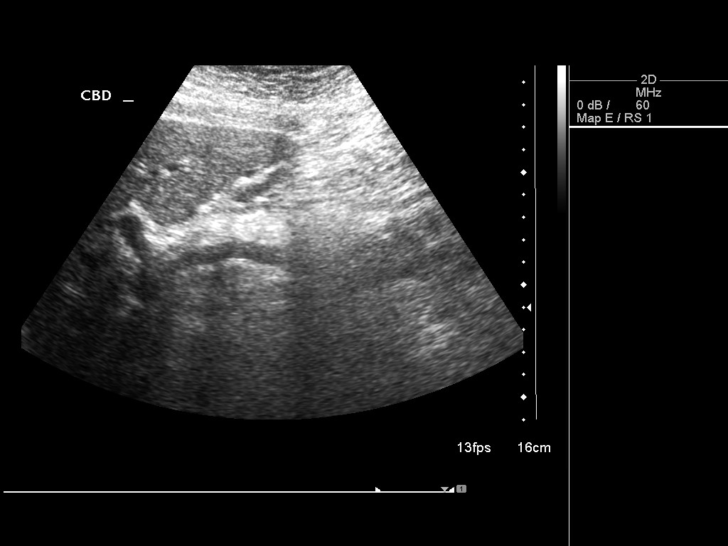
[im 13/74]
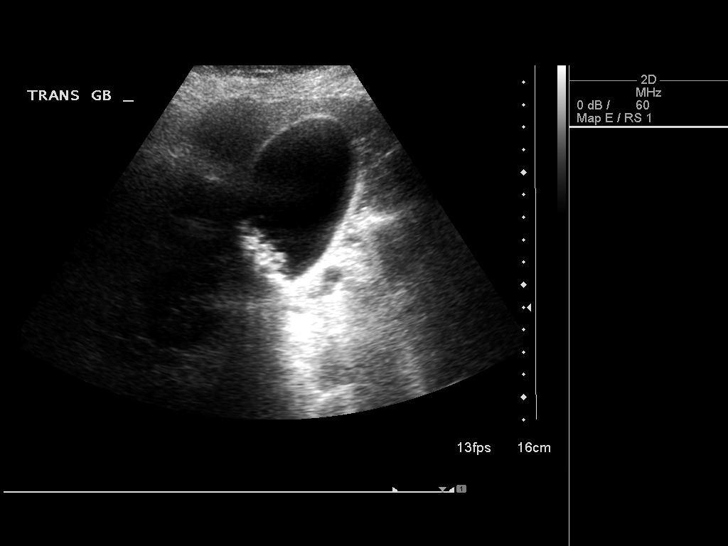
[im 19/74]
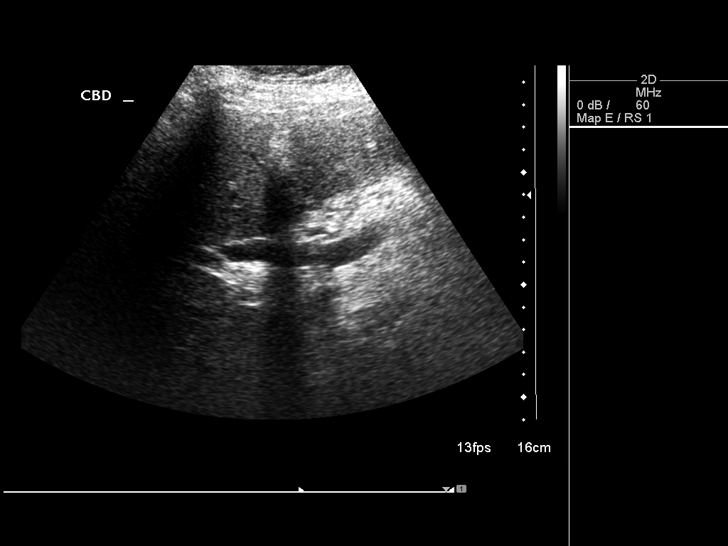
[im 25/74]
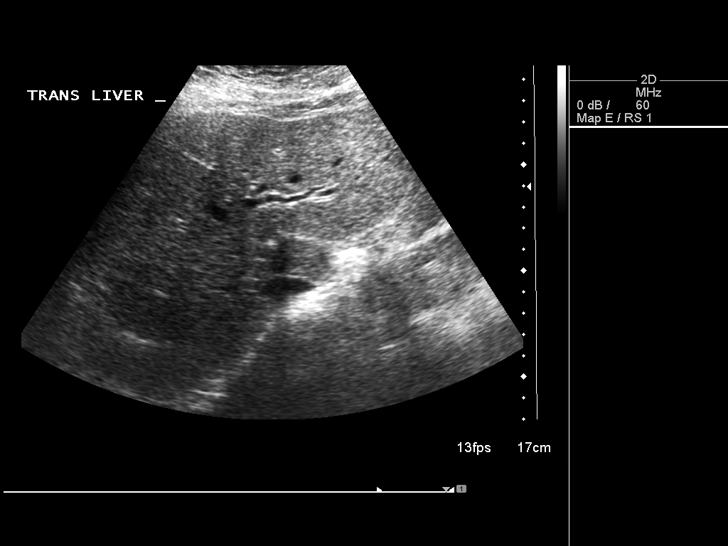
[im 31/74]
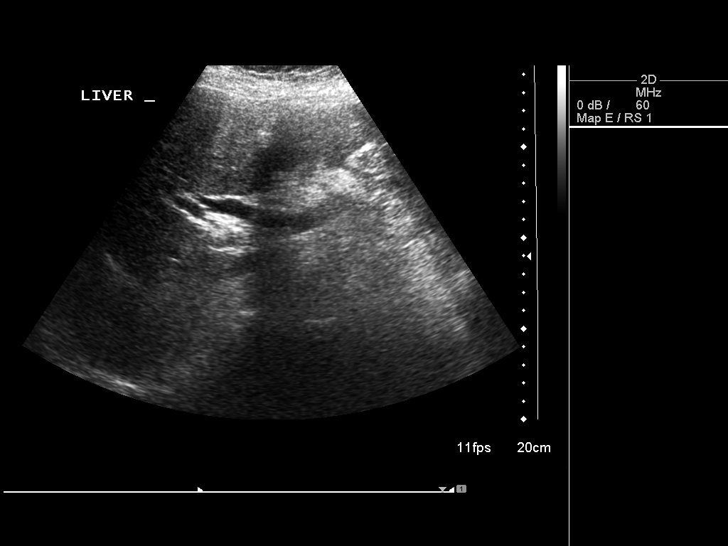
[im 37/74]
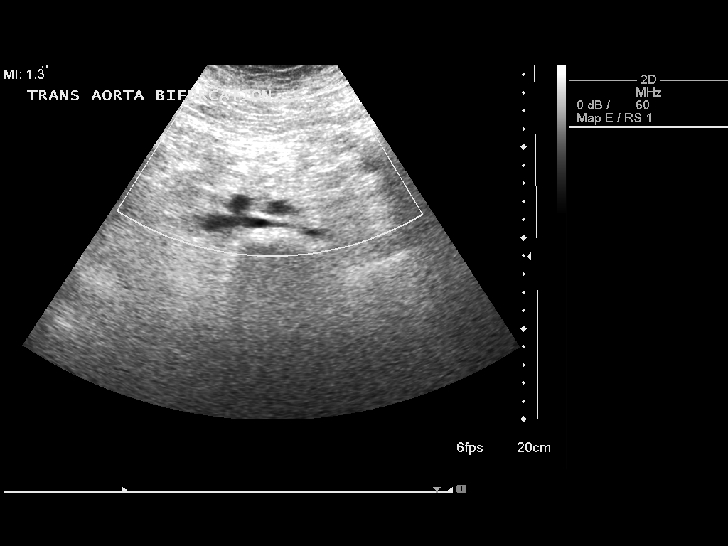
[im 43/74]
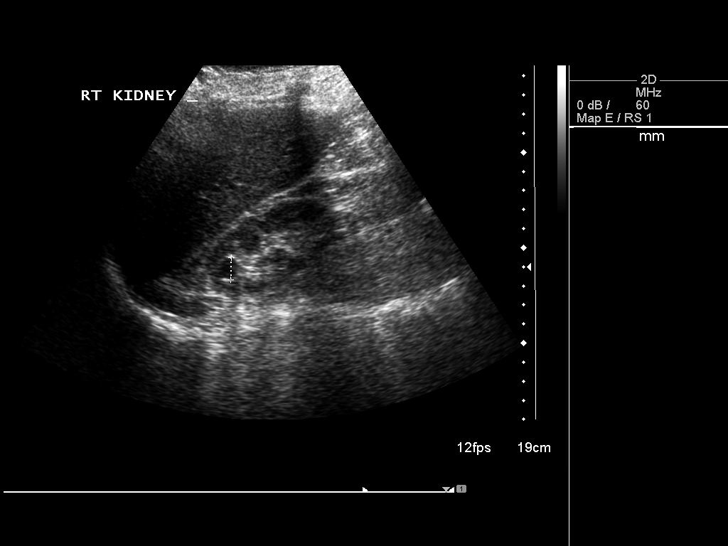
[im 49/74]
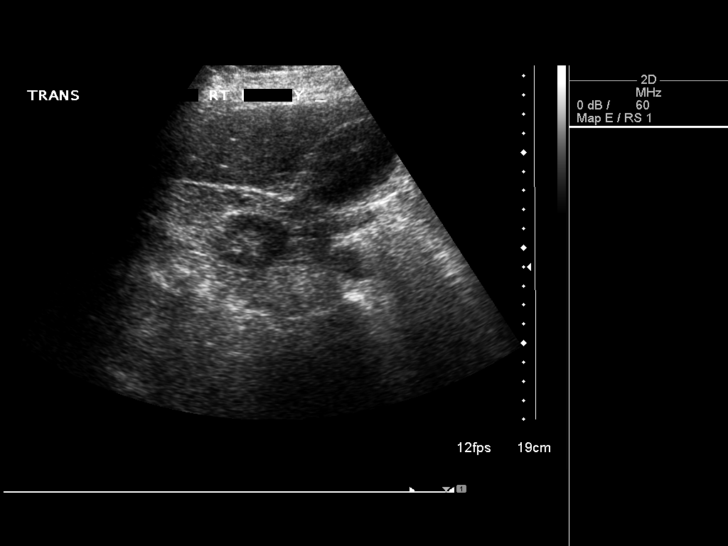
[im 55/74]
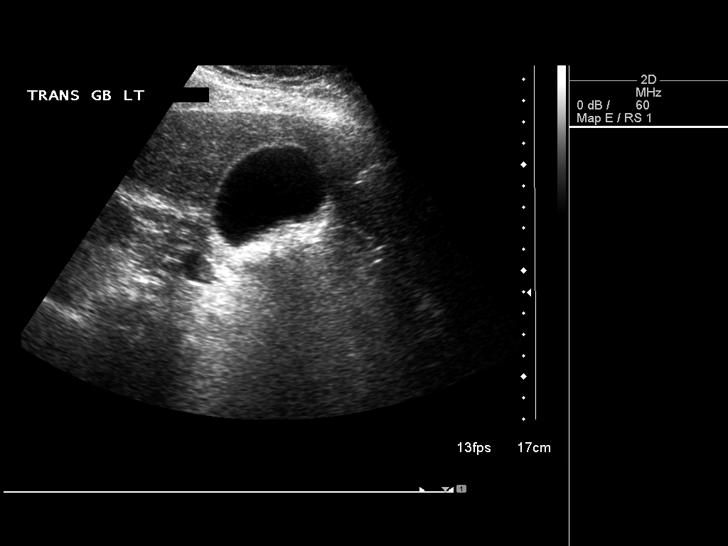
[im 61/74]
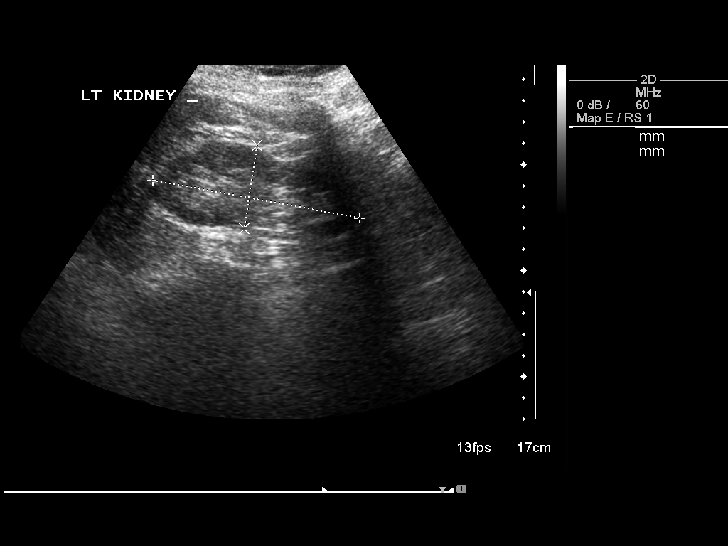
[im 67/74]
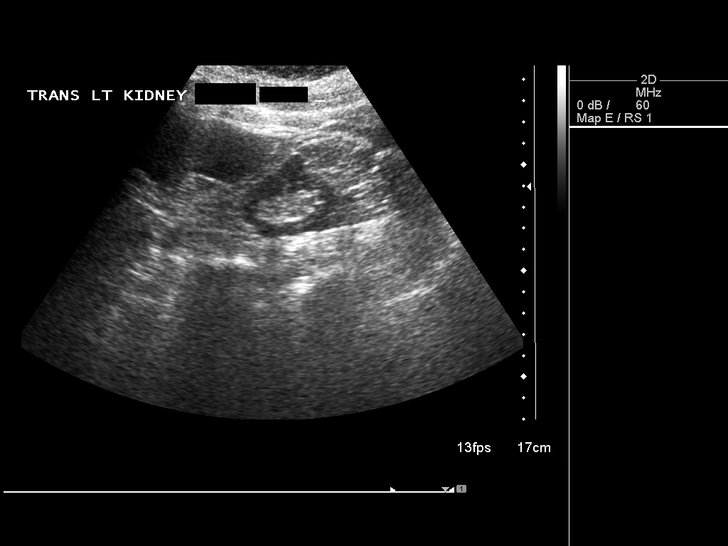
[im 74/74]
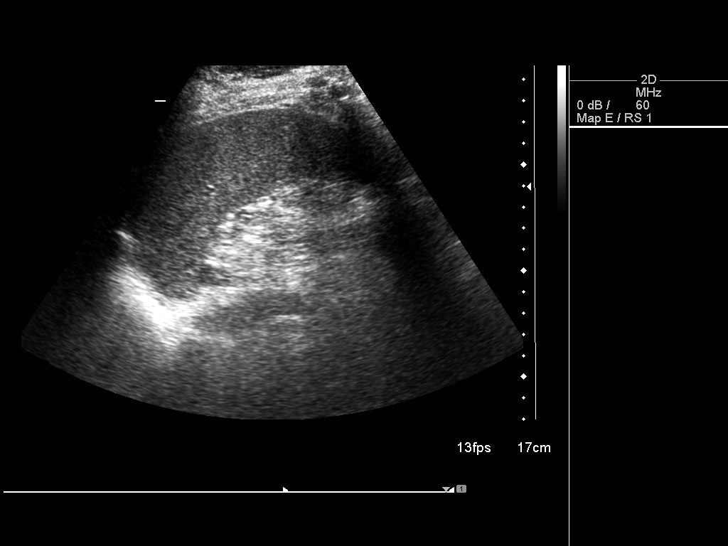

[13 of 25 positions shown; findings below may reference images not displayed]

FINDINGS: Gallbladder: Multiple stones are identified within the gallbladder.
No gallbladder wall thickening or pericholecystic fluid.

Common bile duct: Diameter: Increased caliber measuring 14 mm. 7 mm
calculus is identified within the common bile duct.

Liver: Intrahepatic bile duct dilatation. Parenchymal echogenicity
is normal. No focal liver abnormality.

IVC: No abnormality visualized.

Pancreas: Visualized portion unremarkable.

Spleen: Size and appearance within normal limits.

Right Kidney: Length: 8.6 cm. Diffuse cortical thinning. Upper pole
cyst measures 1.2 cm. No mass or hydronephrosis.

Left Kidney: Length: 9.9 cm. Within the inferior pole of the left
kidney there is a small cyst measuring 2.2 cm.. Echogenicity within
normal limits. No mass or hydronephrosis visualized.

Abdominal aorta: No aneurysm visualized.

Other findings: None.
IMPRESSION: 1. Gallstones, intrahepatic bile duct dilatation and findings
concerning for choledocholithiasis. If further imaging is clinically
indicated an MRCP may be helpful to evaluate the extent of
choledocholithiasis.
2. Renal cysts.
3. Right renal atrophy.

## 2017-02-17 NOTE — Patient Instructions (Signed)

## 2017-02-17 NOTE — Progress Notes (Signed)
Pre visit review using our clinic review tool, if applicable. No additional management support is needed unless otherwise documented below in the visit note. 

## 2017-02-17 NOTE — Progress Notes (Signed)
Subjective:  Patient ID: Claudia Lara, female    DOB: 1932-01-09  Age: 83 y.o. MRN: [id number]  CC: Hypertension   HPI Claudia Lara presents for Follow-up on hypertension and lower extremity edema. She recently ruled out for deep venous thromboses. She tells me that the edema is somewhat better. Her blood pressure has been well controlled and she denies headache/blurred vision/chest pain/shortness of breath/palpitations/fatigue.  Outpatient Medications Prior to Visit  Medication Sig Dispense Refill  . Artificial Tear Ointment (DRY EYES OP) Place 1 drop into both eyes 3 (three) times daily as needed.    . Calcium Carbonate-Vit D-Min (CALCIUM 600 + MINERALS) 600-200 MG-UNIT TABS Take 1 tablet by mouth every evening.     . carvedilol (COREG) 12.5 MG tablet TAKE 1 TABLET BY MOUTH 2 TIMES DAILY WITH A MEAL. 180 tablet 2  . cholecalciferol (VITAMIN D) 1000 units tablet Take 1,000 Units by mouth daily.    . multivitamin-iron-minerals-folic acid (CENTRUM) chewable tablet Chew 1 tablet by mouth daily. Has stopped prior to procedure    . pantoprazole (PROTONIX) 40 MG tablet TAKE 1 TABLET (40 MG TOTAL) BY MOUTH DAILY. 90 tablet 1  . torsemide (DEMADEX) 10 MG tablet Take 1 tablet (10 mg total) by mouth daily. 30 tablet 2  . vitamin C (ASCORBIC ACID) 500 MG tablet Take 500 mg by mouth daily.    . methocarbamol (ROBAXIN) 500 MG tablet Take 1 tablet (500 mg total) by mouth every 6 (six) hours as needed for muscle spasms. 30 tablet 0   No facility-administered medications prior to visit.     ROS Review of Systems  Constitutional: Negative.  Negative for appetite change, diaphoresis, fatigue and unexpected weight change.  HENT: Negative.   Eyes: Negative.  Negative for visual disturbance.  Respiratory: Negative for cough, chest tightness, shortness of breath and wheezing.   Cardiovascular: Negative for chest pain, palpitations and leg swelling.  Gastrointestinal: Negative for abdominal pain,  constipation, diarrhea, nausea and vomiting.  Endocrine: Negative.   Genitourinary: Negative.  Negative for difficulty urinating, dysuria, hematuria and urgency.  Musculoskeletal: Negative.  Negative for back pain, myalgias and neck pain.  Skin: Negative.  Negative for color change and rash.  Allergic/Immunologic: Negative.   Neurological: Negative.  Negative for dizziness, weakness and light-headedness.  Hematological: Negative for adenopathy. Does not bruise/bleed easily.  Psychiatric/Behavioral: Negative.     Objective:  BP (!) 150/50 (BP Location: Left Arm, Patient Position: Sitting)   Pulse 77   Temp 97.8 F (36.6 C) (Oral)   Resp 16   Ht 5' 3.5" (1.613 m)   Wt 168 lb 12 oz (76.5 kg)   SpO2 97%   BMI 29.42 kg/m   BP Readings from Last 3 Encounters:  02/17/17 (!) 150/50  01/24/17 (!) 131/53  01/22/17 (!) 130/58    Wt Readings from Last 3 Encounters:  02/17/17 168 lb 12 oz (76.5 kg)  01/24/17 177 lb (80.3 kg)  01/22/17 177 lb (80.3 kg)    Physical Exam  Constitutional: She is oriented to person, place, and time. No distress.  HENT:  Mouth/Throat: Oropharynx is clear and moist. No oropharyngeal exudate.  Eyes: Conjunctivae are normal. Right eye exhibits no discharge. Left eye exhibits no discharge. No scleral icterus.  Neck: Normal range of motion. Neck supple. No JVD present. No tracheal deviation present. No thyromegaly present.  Cardiovascular: Normal rate, regular rhythm, S1 normal, S2 normal and intact distal pulses.  Exam reveals no gallop and no friction rub.  Murmur heard.  Systolic murmur is present with a grade of 1/6   No diastolic murmur is present  Pulmonary/Chest: Effort normal and breath sounds normal. No stridor. No respiratory distress. She has no wheezes. She has no rales. She exhibits no tenderness.  Abdominal: Soft. Bowel sounds are normal. She exhibits no distension and no mass. There is no tenderness. There is no rebound and no guarding.    Musculoskeletal: Normal range of motion. She exhibits edema (trace pitting edema in BLE). She exhibits no tenderness or deformity.  Lymphadenopathy:    She has no cervical adenopathy.  Neurological: She is oriented to person, place, and time.  Skin: Skin is warm and dry. No rash noted. She is not diaphoretic. No erythema. No pallor.  Vitals reviewed.   Lab Results  Component Value Date   WBC 7.5 01/22/2017   HGB 9.2 (L) 01/22/2017   HCT 26.7 Repeated and verified X2. (L) 01/22/2017   PLT 226.0 01/22/2017   GLUCOSE 83 01/22/2017   CHOL 128 07/09/2016   TRIG 74.0 07/09/2016   HDL 62.60 07/09/2016   LDLCALC 50 07/09/2016   ALT 11 01/22/2017   AST 16 01/22/2017   NA 142 01/22/2017   K 4.3 01/22/2017   CL 112 01/22/2017   CREATININE 1.14 01/22/2017   BUN 19 01/22/2017   CO2 23 01/22/2017   TSH 1.32 01/22/2017   INR 1.66 12/02/2016   HGBA1C 5.2 03/05/2016    No results found.  Assessment & Plan:   Claudia Lara was seen today for hypertension.  Diagnoses and all orders for this visit:  Essential hypertension- her blood pressure is adequately well controlled and the edema is resolving, will continue the current meds.  Aortic stenosis, mild- she has mild edema but is otherwise asymptomatic with respect to this, she will let me know if she develops any symptoms that would require intervention such as syncope, shortness of breath, or chest pain.   I have discontinued Claudia Lara methocarbamol. I am also having her maintain her CALCIUM 600 + MINERALS, carvedilol, pantoprazole, multivitamin-iron-minerals-folic acid, vitamin C, Artificial Tear Ointment (DRY EYES OP), cholecalciferol, and torsemide.  No orders of the defined types were placed in this encounter.    Follow-up: Return in about 6 months (around 08/20/2017).  Claudia Calico, MD

## 2017-02-18 ENCOUNTER — Ambulatory Visit: Payer: Medicare Other | Admitting: Physical Therapy

## 2017-02-18 ENCOUNTER — Encounter: Payer: Self-pay | Admitting: Physical Therapy

## 2017-02-18 DIAGNOSIS — R262 Difficulty in walking, not elsewhere classified: Secondary | ICD-10-CM | POA: Diagnosis not present

## 2017-02-18 DIAGNOSIS — M25651 Stiffness of right hip, not elsewhere classified: Secondary | ICD-10-CM | POA: Diagnosis not present

## 2017-02-18 DIAGNOSIS — R2241 Localized swelling, mass and lump, right lower limb: Secondary | ICD-10-CM | POA: Diagnosis not present

## 2017-02-18 DIAGNOSIS — M25551 Pain in right hip: Secondary | ICD-10-CM

## 2017-02-18 NOTE — Therapy (Signed)
[address] [place], [place], [address] Phone: [phone]   Fax:  [phone]  Physical Therapy Treatment  Patient Details  Name: Claudia Lara MRN: [id number] Date of Birth: 01/14/1932 Referring Provider: Durward Fortes  Encounter Date: 02/18/2017      PT End of Session - 02/18/17 1225    Visit Number 6   Date for PT Re-Evaluation 03/30/17   PT Start Time 1135   PT Stop Time 1225   PT Time Calculation (min) 50 min      Past Medical History:  Diagnosis Date  . ANEMIA 08/22/2009  . CAROTID ARTERY STENOSIS 01/16/2010  . Complication of anesthesia    very hard to wake up from surgery  . DYSPNEA ON EXERTION 11/16/2007  . Gallstones   . GEN OSTEOARTHROSIS INVOLVING MULTIPLE SITES 11/11/2008  . GERD (gastroesophageal reflux disease)   . HEMORRHOIDS, INTERNAL    surgery was in the 90's (per patient)  . NECK PAIN, ACUTE 12/28/2009  . PERIPHERAL EDEMA 10/06/2007  . PVD 10/06/2007  . Unspecified essential hypertension 10/19/2007    Past Surgical History:  Procedure Laterality Date  . ABDOMINAL HYSTERECTOMY    . BUNIONECTOMY     bilat  . CHOLECYSTECTOMY  01/29/2016   at [facility]  . ENDOSCOPIC RETROGRADE CHOLANGIOPANCREATOGRAPHY (ERCP) WITH PROPOFOL N/A 12/01/2015   Procedure: ENDOSCOPIC RETROGRADE CHOLANGIOPANCREATOGRAPHY (ERCP) WITH PROPOFOL;  Surgeon: [name], MD;  Location: [facility];  Service: Endoscopy;  Laterality: N/A;  . ENDOSCOPIC RETROGRADE CHOLANGIOPANCREATOGRAPHY (ERCP) WITH PROPOFOL N/A 02/01/2016   Procedure: ENDOSCOPIC RETROGRADE CHOLANGIOPANCREATOGRAPHY (ERCP) WITH PROPOFOL;  Surgeon: [name], MD;  Location: [facility];  Service: Endoscopy;  Laterality: N/A;  . HAMMER TOE SURGERY    . Hyperplastic colon polyps, removed  2007   By Dr. Penelope Coop  . JOINT REPLACEMENT  2011   right knee  . OOPHORECTOMY    . right hip replacement    . ROTATOR CUFF REPAIR  2004    left (Dr. Durward Fortes)  . TOTAL HIP ARTHROPLASTY Right 11/26/2016   Procedure: RIGHT TOTAL HIP ARTHROPLASTY;  Surgeon: Garald Balding, MD;  Location: [place];  Service: Orthopedics;  Laterality: Right;  . TOTAL KNEE ARTHROPLASTY Right     There were no vitals filed for this visit.      Subjective Assessment - 02/18/17 1145    Subjective good work out last time, no soreness   Currently in Pain? No/denies                         [facility] Adult PT Treatment/Exercise - 02/18/17 0001      Knee/Hip Exercises: Aerobic   Recumbent Bike 6   Nustep level 5 x 7 minutes  LE only     Knee/Hip Exercises: Machines for Strengthening   Cybex Knee Extension 5lb 2x10 RLE  10# both legs 10 times   Cybex Knee Flexion 15lb 2x10 RLE   20#  10 times both legs   Cybex Leg Press 30lb 2 sets 15     Knee/Hip Exercises: Standing   Other Standing Knee Exercises 3# step over roll fwd and SW 15 times each leg  sit to stand on airex 3 set 5 CGA   Other Standing Knee Exercises 3# hip 3 way on airex 15 times each                  PT Short Term Goals - 02/14/17  [place] #1   Title independent with initial HEP   Status Achieved           PT Long Term Goals - 02/14/17 1126      PT LONG TERM GOAL #1   Title walk iwthout device x 300 feet   Baseline walk about 200 feet but needs SPC   Status On-going     PT LONG TERM GOAL #2   Title have no pain for all ADL's   Status On-going     PT LONG TERM GOAL #3   Title increase strength of the right hip to 4/5   Status On-going     PT LONG TERM GOAL #4   Title decrease TUG time to 16 seconds   Baseline TUG without AD 14 sec   Status Achieved     PT LONG TERM GOAL #5   Title shop without difficulty   Status On-going               Plan - 02/18/17 1226    Clinical Impression Statement pt tolerated increased wt and advanced hip strengthening well, pt very motivated to get stronger.   PT Next  Visit Plan hip strengthening      Patient will benefit from skilled therapeutic intervention in order to improve the following deficits and impairments:  Abnormal gait, Cardiopulmonary status limiting activity, Decreased activity tolerance, Decreased balance, Decreased mobility, Decreased range of motion, Decreased strength, Increased edema, Difficulty walking, Impaired flexibility, Pain  Visit Diagnosis: Difficulty in walking, not elsewhere classified  Stiffness of right hip, not elsewhere classified  Pain in right hip     Problem List Patient Active Problem List   Diagnosis Date Noted  . Unilateral primary osteoarthritis, right hip 11/26/2016  . S/P total hip arthroplasty 11/26/2016  . GERD (gastroesophageal reflux disease) 08/03/2015  . Hemolytic anemia ([place]) 07/12/2015  . Chronic venous insufficiency 08/10/2014  . Aortic stenosis, mild 06/29/2014  . Left ventricular diastolic dysfunction, NYHA class 1 06/29/2014  . Senile osteopenia 04/21/2014  . Hyperlipidemia with target LDL less than 130 04/21/2014  . Kidney disease, chronic, stage III (GFR 30-59 ml/min) 04/21/2014  . Routine health maintenance 07/16/2012  . Carotid artery stenosis without cerebral infarction 01/16/2010  . Essential hypertension 10/19/2007    PAYSEUR,ANGIE PTA 02/18/2017, 12:27 PM  [address] [place], [place], [address] Phone: [phone]   Fax:  [phone]  Name: Claudia Lara MRN: [id number] Date of Birth: 1932/09/25

## 2017-02-20 ENCOUNTER — Ambulatory Visit: Payer: Medicare Other | Admitting: Physical Therapy

## 2017-02-20 ENCOUNTER — Encounter: Payer: Self-pay | Admitting: Physical Therapy

## 2017-02-20 DIAGNOSIS — R2241 Localized swelling, mass and lump, right lower limb: Secondary | ICD-10-CM | POA: Diagnosis not present

## 2017-02-20 DIAGNOSIS — M25651 Stiffness of right hip, not elsewhere classified: Secondary | ICD-10-CM | POA: Diagnosis not present

## 2017-02-20 DIAGNOSIS — R262 Difficulty in walking, not elsewhere classified: Secondary | ICD-10-CM

## 2017-02-20 DIAGNOSIS — M25551 Pain in right hip: Secondary | ICD-10-CM

## 2017-02-20 NOTE — Therapy (Signed)
[address] [place], [place], [address] Phone: [phone]   Fax:  [phone]  Physical Therapy Treatment  Patient Details  Name: Claudia Lara MRN: [id number] Date of Birth: [birth year] Referring Provider: Durward Fortes  Encounter Date: 02/20/2017      PT End of Session - 02/20/17 1553    Visit Number 7   Date for PT Re-Evaluation 03/30/17   PT Start Time 1505   PT Stop Time 1600   PT Time Calculation (min) 55 min      Past Medical History:  Diagnosis Date  . ANEMIA 08/22/2009  . CAROTID ARTERY STENOSIS 01/16/2010  . Complication of anesthesia    very hard to wake up from surgery  . DYSPNEA ON EXERTION 11/16/2007  . Gallstones   . GEN OSTEOARTHROSIS INVOLVING MULTIPLE SITES 11/11/2008  . GERD (gastroesophageal reflux disease)   . HEMORRHOIDS, INTERNAL    surgery was in the 90's (per patient)  . NECK PAIN, ACUTE 12/28/2009  . PERIPHERAL EDEMA 10/06/2007  . PVD 10/06/2007  . Unspecified essential hypertension 10/19/2007    Past Surgical History:  Procedure Laterality Date  . ABDOMINAL HYSTERECTOMY    . BUNIONECTOMY     bilat  . CHOLECYSTECTOMY  01/29/2016   at [facility]  . ENDOSCOPIC RETROGRADE CHOLANGIOPANCREATOGRAPHY (ERCP) WITH PROPOFOL N/A 12/01/2015   Procedure: ENDOSCOPIC RETROGRADE CHOLANGIOPANCREATOGRAPHY (ERCP) WITH PROPOFOL;  Surgeon: [name], MD;  Location: [facility];  Service: Endoscopy;  Laterality: N/A;  . ENDOSCOPIC RETROGRADE CHOLANGIOPANCREATOGRAPHY (ERCP) WITH PROPOFOL N/A 02/01/2016   Procedure: ENDOSCOPIC RETROGRADE CHOLANGIOPANCREATOGRAPHY (ERCP) WITH PROPOFOL;  Surgeon: [name], MD;  [facility];  Service: Endoscopy;  Laterality: N/A;  . HAMMER TOE SURGERY    . Hyperplastic colon polyps, removed  2007   By Dr. Penelope Coop  . JOINT REPLACEMENT  2011   right knee  . OOPHORECTOMY    . right hip replacement    . ROTATOR CUFF REPAIR  2004    left (Dr. Durward Fortes)  . TOTAL HIP ARTHROPLASTY Right 11/26/2016   Procedure: RIGHT TOTAL HIP ARTHROPLASTY;  Surgeon: Garald Balding, MD;  Location: [place];  Service: Orthopedics;  Laterality: Right;  . TOTAL KNEE ARTHROPLASTY Right     There were no vitals filed for this visit.      Subjective Assessment - 02/20/17 1510    Subjective amb in with a limp -verb usually here in the morning weaker/tired in the afternnon.   Currently in Pain? No/denies                         [facility] Adult PT Treatment/Exercise - 02/20/17 0001      Knee/Hip Exercises: Aerobic   Recumbent Bike 6 min   Nustep level 5 x 7 minutes     Knee/Hip Exercises: Machines for Strengthening   Cybex Knee Extension 5lb 2x10 RLE  10# both legs 10 times   Cybex Knee Flexion 15lb 2x10 RLE    Cybex Leg Press 30lb 2 sets 15     Knee/Hip Exercises: Standing   Lateral Step Up Right;2 sets;10 reps;Step Height: 4";Hand Hold: 2   Forward Step Up Right;2 sets;10 reps;Hand Hold: 2;Step Height: 4"  increased ease coming up smoother   Walking with Sports Cord black tband   Other Standing Knee Exercises fitter standing abd 2 sets 10     Knee/Hip Exercises: Seated   Clamshell with Marga Hoots   Other  Seated Knee/Hip Exercises yellow tband hip IR/ER 2 sets 10                  PT Short Term Goals - 02/14/17 1126      PT SHORT TERM GOAL #1   Title independent with initial HEP   Status Achieved           PT Long Term Goals - 02/20/17 1554      PT LONG TERM GOAL #1   Title walk iwthout device x 300 feet   Baseline walk about 200 feet but needs SPC   Status On-going     PT LONG TERM GOAL #2   Title have no pain for all ADL's   Status On-going     PT LONG TERM GOAL #3   Title increase strength of the right hip to 4/5   Baseline 4-/5   Status On-going               Plan - 02/20/17 1555    Clinical Impression Statement pt with limited IR/ER of hip. hip weakness throughout  and pronounced limp with PT in afternoon vs evening. Progressing towards goals.   PT Next Visit Plan hip strengthening      Patient will benefit from skilled therapeutic intervention in order to improve the following deficits and impairments:  Abnormal gait, Cardiopulmonary status limiting activity, Decreased activity tolerance, Decreased balance, Decreased mobility, Decreased range of motion, Decreased strength, Increased edema, Difficulty walking, Impaired flexibility, Pain  Visit Diagnosis: Difficulty in walking, not elsewhere classified  Stiffness of right hip, not elsewhere classified  Pain in right hip     Problem List Patient Active Problem List   Diagnosis Date Noted  . Unilateral primary osteoarthritis, right hip 11/26/2016  . S/P total hip arthroplasty 11/26/2016  . GERD (gastroesophageal reflux disease) 08/03/2015  . Hemolytic anemia ([place]) 07/12/2015  . Chronic venous insufficiency 08/10/2014  . Aortic stenosis, mild 06/29/2014  . Left ventricular diastolic dysfunction, NYHA class 1 06/29/2014  . Senile osteopenia 04/21/2014  . Hyperlipidemia with target LDL less than 130 04/21/2014  . Kidney disease, chronic, stage III (GFR 30-59 ml/min) 04/21/2014  . Routine health maintenance 07/16/2012  . Carotid artery stenosis without cerebral infarction 01/16/2010  . Essential hypertension 10/19/2007    PAYSEUR,ANGIE PTA  02/20/2017, 3:57 PM  [address] [place], [place], [address] Phone: [phone]   Fax:  [phone]  Name: Claudia Lara MRN: [id number] Date of Birth: 04-14-1932

## 2017-02-25 ENCOUNTER — Ambulatory Visit: Payer: Medicare Other | Attending: Orthopaedic Surgery | Admitting: Physical Therapy

## 2017-02-25 ENCOUNTER — Encounter: Payer: Self-pay | Admitting: Physical Therapy

## 2017-02-25 DIAGNOSIS — M25551 Pain in right hip: Secondary | ICD-10-CM | POA: Diagnosis not present

## 2017-02-25 DIAGNOSIS — R262 Difficulty in walking, not elsewhere classified: Secondary | ICD-10-CM | POA: Diagnosis not present

## 2017-02-25 DIAGNOSIS — M25651 Stiffness of right hip, not elsewhere classified: Secondary | ICD-10-CM | POA: Diagnosis not present

## 2017-02-25 NOTE — Therapy (Signed)
[name] [place], [place], [address] Phone: [phone]   Fax:  [phone]  Physical Therapy Treatment  Patient Details  Name: Claudia Lara MRN: [id number] Date of Birth: 05-15-1932 Referring Provider: Durward Fortes  Encounter Date: 02/25/2017      PT End of Session - 02/25/17 1409    Visit Number 8   PT Start Time 1300   PT Stop Time 9163   PT Time Calculation (min) 65 min      Past Medical History:  Diagnosis Date  . ANEMIA 08/22/2009  . CAROTID ARTERY STENOSIS 01/16/2010  . Complication of anesthesia    very hard to wake up from surgery  . DYSPNEA ON EXERTION 11/16/2007  . Gallstones   . GEN OSTEOARTHROSIS INVOLVING MULTIPLE SITES 11/11/2008  . GERD (gastroesophageal reflux disease)   . HEMORRHOIDS, INTERNAL    surgery was in the 90's (per patient)  . NECK PAIN, ACUTE 12/28/2009  . PERIPHERAL EDEMA 10/06/2007  . PVD 10/06/2007  . Unspecified essential hypertension 10/19/2007    Past Surgical History:  Procedure Laterality Date  . ABDOMINAL HYSTERECTOMY    . BUNIONECTOMY     bilat  . CHOLECYSTECTOMY  01/29/2016   at [facility]  . ENDOSCOPIC RETROGRADE CHOLANGIOPANCREATOGRAPHY (ERCP) WITH PROPOFOL N/A 12/01/2015   Procedure: ENDOSCOPIC RETROGRADE CHOLANGIOPANCREATOGRAPHY (ERCP) WITH PROPOFOL;  Surgeon: [name], MD;  [facility];  Service: Endoscopy;  Laterality: N/A;  . ENDOSCOPIC RETROGRADE CHOLANGIOPANCREATOGRAPHY (ERCP) WITH PROPOFOL N/A 02/01/2016   Procedure: ENDOSCOPIC RETROGRADE CHOLANGIOPANCREATOGRAPHY (ERCP) WITH PROPOFOL;  Surgeon: [name], MD;  [facility];  Service: Endoscopy;  Laterality: N/A;  . HAMMER TOE SURGERY    . Hyperplastic colon polyps, removed  2007   By Dr. Penelope Coop  . JOINT REPLACEMENT  2011   right knee  . OOPHORECTOMY    . right hip replacement    . ROTATOR CUFF REPAIR  2004   left (Dr. Durward Fortes)  . TOTAL HIP  ARTHROPLASTY Right 11/26/2016   Procedure: RIGHT TOTAL HIP ARTHROPLASTY;  Surgeon: Garald Balding, MD;  Location: [place];  Service: Orthopedics;  Laterality: Right;  . TOTAL KNEE ARTHROPLASTY Right     There were no vitals filed for this visit.      Subjective Assessment - 02/25/17 1315    Subjective Same old feeling in R hip    Currently in Pain? No/denies                         [facility] Adult PT Treatment/Exercise - 02/25/17 0001      Ambulation/Gait   Gait Comments Ambulated with SPC for 700 ft     Knee/Hip Exercises: Aerobic   Recumbent Bike 6 min  L1   Nustep level 5 x 7 minutes     Knee/Hip Exercises: Machines for Strengthening   Cybex Knee Extension 5lb 2x10 RLE   Cybex Knee Flexion 15lb 2x10 RLE    Cybex Leg Press 30lb 2 sets 15     Knee/Hip Exercises: Standing   Other Standing Knee Exercises HHA marching fwd ,back and SW   for hip strengthening   Other Standing Knee Exercises 3# fwd step touch fwd and SW 6 inch  red tabnd hip IR/ER                  PT Short Term Goals - 02/14/17 1126      PT SHORT TERM GOAL #  1   Title independent with initial HEP   Status Achieved           PT Long Term Goals - 02/20/17 1554      PT LONG TERM GOAL #1   Title walk iwthout device x 300 feet   Baseline walk about 200 feet but needs SPC   Status On-going     PT LONG TERM GOAL #2   Title have no pain for all ADL's   Status On-going     PT LONG TERM GOAL #3   Title increase strength of the right hip to 4/5   Baseline 4-/5   Status On-going               Plan - 02/25/17 1412    Clinical Impression Statement Pt needed increased VC with theraband exercises, limp was pronounced after weighted toe touches       Patient will benefit from skilled therapeutic intervention in order to improve the following deficits and impairments:  Abnormal gait, Cardiopulmonary status limiting activity, Decreased activity tolerance, Decreased balance,  Decreased mobility, Decreased range of motion, Decreased strength, Increased edema, Difficulty walking, Impaired flexibility, Pain  Visit Diagnosis: Difficulty in walking, not elsewhere classified  Stiffness of right hip, not elsewhere classified  Pain in right hip     Problem List Patient Active Problem List   Diagnosis Date Noted  . Unilateral primary osteoarthritis, right hip 11/26/2016  . S/P total hip arthroplasty 11/26/2016  . GERD (gastroesophageal reflux disease) 08/03/2015  . Hemolytic anemia ([place]) 07/12/2015  . Chronic venous insufficiency 08/10/2014  . Aortic stenosis, mild 06/29/2014  . Left ventricular diastolic dysfunction, NYHA class 1 06/29/2014  . Senile osteopenia 04/21/2014  . Hyperlipidemia with target LDL less than 130 04/21/2014  . Kidney disease, chronic, stage III (GFR 30-59 ml/min) 04/21/2014  . Routine health maintenance 07/16/2012  . Carotid artery stenosis without cerebral infarction 01/16/2010  . Essential hypertension 10/19/2007    Nakeya Adinolfi,ANGIE PTA 02/25/2017, 2:17 PM  [address] [place], [place], [address] Phone: [phone]   Fax:  [phone]  Name: Claudia Lara MRN: [id number] Date of Birth: [birth year]

## 2017-02-27 ENCOUNTER — Ambulatory Visit: Payer: Medicare Other | Admitting: Physical Therapy

## 2017-03-04 ENCOUNTER — Ambulatory Visit: Payer: Medicare Other | Admitting: Physical Therapy

## 2017-03-04 ENCOUNTER — Encounter: Payer: Self-pay | Admitting: Physical Therapy

## 2017-03-04 DIAGNOSIS — M25651 Stiffness of right hip, not elsewhere classified: Secondary | ICD-10-CM

## 2017-03-04 DIAGNOSIS — M25551 Pain in right hip: Secondary | ICD-10-CM | POA: Diagnosis not present

## 2017-03-04 DIAGNOSIS — R262 Difficulty in walking, not elsewhere classified: Secondary | ICD-10-CM | POA: Diagnosis not present

## 2017-03-04 NOTE — Therapy (Signed)
[address] [place], [place], [address] Phone: [phone]   Fax:  [phone]  Physical Therapy Treatment  Patient Details  Name: Claudia Lara MRN: [id number] Date of Birth: Oct 22, 1932 Referring Provider: Durward Fortes  Encounter Date: 03/04/2017      PT End of Session - 03/04/17 1058    Visit Number 9   Date for PT Re-Evaluation 03/30/17   PT Start Time 4665   PT Stop Time 1100   PT Time Calculation (min) 45 min      Past Medical History:  Diagnosis Date  . ANEMIA 08/22/2009  . CAROTID ARTERY STENOSIS 01/16/2010  . Complication of anesthesia    very hard to wake up from surgery  . DYSPNEA ON EXERTION 11/16/2007  . Gallstones   . GEN OSTEOARTHROSIS INVOLVING MULTIPLE SITES 11/11/2008  . GERD (gastroesophageal reflux disease)   . HEMORRHOIDS, INTERNAL    surgery was in the 90's (per patient)  . NECK PAIN, ACUTE 12/28/2009  . PERIPHERAL EDEMA 10/06/2007  . PVD 10/06/2007  . Unspecified essential hypertension 10/19/2007    Past Surgical History:  Procedure Laterality Date  . ABDOMINAL HYSTERECTOMY    . BUNIONECTOMY     bilat  . CHOLECYSTECTOMY  01/29/2016   at [facility]  . ENDOSCOPIC RETROGRADE CHOLANGIOPANCREATOGRAPHY (ERCP) WITH PROPOFOL N/A 12/01/2015   Procedure: ENDOSCOPIC RETROGRADE CHOLANGIOPANCREATOGRAPHY (ERCP) WITH PROPOFOL;  Surgeon: [name], MD;  Location[facility];  Service: Endoscopy;  Laterality: N/A;  . ENDOSCOPIC RETROGRADE CHOLANGIOPANCREATOGRAPHY (ERCP) WITH PROPOFOL N/A 02/01/2016   Procedure: ENDOSCOPIC RETROGRADE CHOLANGIOPANCREATOGRAPHY (ERCP) WITH PROPOFOL;  Surgeon: [name], MD;  [facility];  Service: Endoscopy;  Laterality: N/A;  . HAMMER TOE SURGERY    . Hyperplastic colon polyps, removed  2007   By Dr. Penelope Coop  . JOINT REPLACEMENT  2011   right knee  . OOPHORECTOMY    . right hip replacement    . ROTATOR CUFF REPAIR  2004    left (Dr. Durward Fortes)  . TOTAL HIP ARTHROPLASTY Right 11/26/2016   Procedure: RIGHT TOTAL HIP ARTHROPLASTY;  Surgeon: Garald Balding, MD;  Location: [place];  Service: Orthopedics;  Laterality: Right;  . TOTAL KNEE ARTHROPLASTY Right     There were no vitals filed for this visit.      Subjective Assessment - 03/04/17 1009    Subjective "still using this darn cane"   Currently in Pain? No/denies                         [facility] Adult PT Treatment/Exercise - 03/04/17 0001      Ambulation/Gait   Gait Comments added Left heel lift as pt is 1/2 inch shorter causing limp, decreased limp with lift     High Level Balance   High Level Balance Activities Side stepping;Marching forwards;Marching backwards  HHA with red tband     Knee/Hip Exercises: Aerobic   Elliptical 3 min I 5 R 3   Recumbent Bike 6 min     Knee/Hip Exercises: Machines for Strengthening   Cybex Knee Extension 5lb 2x10 RLE   Cybex Knee Flexion 15lb 2x10 RLE      Knee/Hip Exercises: Seated   Other Seated Knee/Hip Exercises 3# IR/ER 3 sets 10     Manual Therapy   Manual Therapy Soft tissue mobilization;Passive ROM   Soft tissue mobilization hip add  PT Education - 03/04/17 1058    Education provided Yes   Education Details hip add stretch, red tband side stepping   Person(s) Educated Patient   Methods Explanation;Demonstration;Handout   Comprehension Verbalized understanding;Returned demonstration          PT Short Term Goals - 02/14/17 1126      PT SHORT TERM GOAL #1   Title independent with initial HEP   Status Achieved           PT Long Term Goals - 02/20/17 1554      PT LONG TERM GOAL #1   Title walk iwthout device x 300 feet   Baseline walk about 200 feet but needs SPC   Status On-going     PT LONG TERM GOAL #2   Title have no pain for all ADL's   Status On-going     PT LONG TERM GOAL #3   Title increase strength of the right hip to 4/5    Baseline 4-/5   Status On-going               Plan - 03/04/17 1059    Clinical Impression Statement RT leg 1/2 longer, added Left heel lift and helped decrease limp and pt reports feels better with lift. very very tight in hip rotators and add-verb adn tactile cuing to relax. added to HEP   PT Next Visit Plan hip strengthening, asses lift and HEP.check goals      Patient will benefit from skilled therapeutic intervention in order to improve the following deficits and impairments:  Abnormal gait, Cardiopulmonary status limiting activity, Decreased activity tolerance, Decreased balance, Decreased mobility, Decreased range of motion, Decreased strength, Increased edema, Difficulty walking, Impaired flexibility, Pain  Visit Diagnosis: Difficulty in walking, not elsewhere classified  Stiffness of right hip, not elsewhere classified  Pain in right hip     Problem List Patient Active Problem List   Diagnosis Date Noted  . Unilateral primary osteoarthritis, right hip 11/26/2016  . S/P total hip arthroplasty 11/26/2016  . GERD (gastroesophageal reflux disease) 08/03/2015  . Hemolytic anemia ([place]) 07/12/2015  . Chronic venous insufficiency 08/10/2014  . Aortic stenosis, mild 06/29/2014  . Left ventricular diastolic dysfunction, NYHA class 1 06/29/2014  . Senile osteopenia 04/21/2014  . Hyperlipidemia with target LDL less than 130 04/21/2014  . Kidney disease, chronic, stage III (GFR 30-59 ml/min) 04/21/2014  . Routine health maintenance 07/16/2012  . Carotid artery stenosis without cerebral infarction 01/16/2010  . Essential hypertension 10/19/2007    Ura Hausen,ANGIE PTA 03/04/2017, 11:01 AM  [address] [place], [place], [address] Phone: [phone]   Fax:  [phone]  Name: Claudia Lara MRN: [id number] Date of Birth: 1932/06/09

## 2017-03-06 ENCOUNTER — Encounter: Payer: Self-pay | Admitting: Physical Therapy

## 2017-03-06 ENCOUNTER — Ambulatory Visit: Payer: Medicare Other | Admitting: Physical Therapy

## 2017-03-06 DIAGNOSIS — R262 Difficulty in walking, not elsewhere classified: Secondary | ICD-10-CM

## 2017-03-06 DIAGNOSIS — M25651 Stiffness of right hip, not elsewhere classified: Secondary | ICD-10-CM

## 2017-03-06 DIAGNOSIS — M25551 Pain in right hip: Secondary | ICD-10-CM | POA: Diagnosis not present

## 2017-03-06 NOTE — Therapy (Addendum)
[address] [place], [place], [address] Phone: [phone]   Fax:  [phone]  Physical Therapy Treatment  Patient Details  Name: Claudia Lara MRN: [id number] Date of Birth: [birth year] Referring Provider: Durward Fortes  Encounter Date: 03/06/2017      PT End of Session - 03/06/17 1245    Visit Number 10   Date for PT Re-Evaluation 03/30/17   PT Start Time 8850   PT Stop Time 1105   PT Time Calculation (min) 50 min   Activity Tolerance Patient tolerated treatment well   Behavior During Therapy [facility] for tasks assessed/performed      Past Medical History:  Diagnosis Date  . ANEMIA 08/22/2009  . CAROTID ARTERY STENOSIS 01/16/2010  . Complication of anesthesia    very hard to wake up from surgery  . DYSPNEA ON EXERTION 11/16/2007  . Gallstones   . GEN OSTEOARTHROSIS INVOLVING MULTIPLE SITES 11/11/2008  . GERD (gastroesophageal reflux disease)   . HEMORRHOIDS, INTERNAL    surgery was in the 90's (per patient)  . NECK PAIN, ACUTE 12/28/2009  . PERIPHERAL EDEMA 10/06/2007  . PVD 10/06/2007  . Unspecified essential hypertension 10/19/2007    Past Surgical History:  Procedure Laterality Date  . ABDOMINAL HYSTERECTOMY    . BUNIONECTOMY     bilat  . CHOLECYSTECTOMY  01/29/2016   at [facility]  . ENDOSCOPIC RETROGRADE CHOLANGIOPANCREATOGRAPHY (ERCP) WITH PROPOFOL N/A 12/01/2015   Procedure: ENDOSCOPIC RETROGRADE CHOLANGIOPANCREATOGRAPHY (ERCP) WITH PROPOFOL;  Surgeon: [name], MD;  [facility];  Service: Endoscopy;  Laterality: N/A;  . ENDOSCOPIC RETROGRADE CHOLANGIOPANCREATOGRAPHY (ERCP) WITH PROPOFOL N/A 02/01/2016   Procedure: ENDOSCOPIC RETROGRADE CHOLANGIOPANCREATOGRAPHY (ERCP) WITH PROPOFOL;  Surgeon: [name], MD;  [facility];  Service: Endoscopy;  Laterality: N/A;  . HAMMER TOE SURGERY    . Hyperplastic colon polyps, removed  2007   By Dr. Penelope Coop  .  JOINT REPLACEMENT  2011   right knee  . OOPHORECTOMY    . right hip replacement    . ROTATOR CUFF REPAIR  2004   left (Dr. Durward Fortes)  . TOTAL HIP ARTHROPLASTY Right 11/26/2016   Procedure: RIGHT TOTAL HIP ARTHROPLASTY;  Surgeon: Garald Balding, MD;  Location: [place];  Service: Orthopedics;  Laterality: Right;  . TOTAL KNEE ARTHROPLASTY Right     There were no vitals filed for this visit.      Subjective Assessment - 03/06/17 1109    Subjective Patient is still having the same feeling of stiffness, "it's not doing what I want it to do"   Currently in Pain? No/denies                         [facility] Adult PT Treatment/Exercise - 03/06/17 0001      High Level Balance   High Level Balance Activities Side stepping     Knee/Hip Exercises: Stretches   Passive Hamstring Stretch Both;3 reps;30 seconds;Right   Hip Flexor Stretch Right;3 reps;30 seconds   Other Knee/Hip Stretches R IR & ER 3 sets x 30 sec      Knee/Hip Exercises: Aerobic   Recumbent Bike 6 min     Knee/Hip Exercises: Standing   Hip Flexion AROM;Stengthening;Right;1 set;10 reps   Hip Flexion Limitations 5# on pulley   Hip ADduction AROM;Strengthening;Right;1 set;10 reps  #5 on pulley    Hip ADduction Limitations 5# on pulley   Hip Abduction AROM;Stengthening;Right;1 set;10 reps  Abduction Limitations 5# on pulley   SLS HHA 3 sets x 30 sec    Gait Training Walked around the building with a break at the back tables.      Knee/Hip Exercises: Seated   Ball Squeeze Green ball 3 sets x 10   Other Seated Knee/Hip Exercises red theraband IR/ER 3 sets x 10      Knee/Hip Exercises: Supine   Bridges Limitations 3 sets x 10                  PT Short Term Goals - 02/14/17 1126      PT SHORT TERM GOAL #1   Title independent with initial HEP   Status Achieved           PT Long Term Goals - 03/06/17 1252      PT LONG TERM GOAL #1   Title walk iwthout device x 300 feet   Status  Partially Met     PT LONG TERM GOAL #3   Title increase strength of the right hip to 4/5   Status Partially Met               Plan - 03/06/17 1246    Clinical Impression Statement Pt reports that she feels better with the lift however she did not wear it today because she wore a different type of shoe. She is still very tight in hip rotators. She was able to walk around the whole building with 1 seated break and 2 standing breaks, 1 at the back tables, 1 half way up the hill, and 1 at the top of the hill. She reported the breaks were due to SOB not because of her hip and she uses the cane because she claims she walks faster with it or SOB. She also stated she does not use her cane very much at hope and not at all when she is working in her yard.    PT Next Visit Plan Hip strengthening, continue with SLS and stretching.       Patient will benefit from skilled therapeutic intervention in order to improve the following deficits and impairments:  Abnormal gait, Cardiopulmonary status limiting activity, Decreased activity tolerance, Decreased balance, Decreased mobility, Decreased range of motion, Decreased strength, Increased edema, Difficulty walking, Impaired flexibility, Pain  Visit Diagnosis: Difficulty in walking, not elsewhere classified  Stiffness of right hip, not elsewhere classified     Problem List Patient Active Problem List   Diagnosis Date Noted  . Unilateral primary osteoarthritis, right hip 11/26/2016  . S/P total hip arthroplasty 11/26/2016  . GERD (gastroesophageal reflux disease) 08/03/2015  . Hemolytic anemia ([place]) 07/12/2015  . Chronic venous insufficiency 08/10/2014  . Aortic stenosis, mild 06/29/2014  . Left ventricular diastolic dysfunction, NYHA class 1 06/29/2014  . Senile osteopenia 04/21/2014  . Hyperlipidemia with target LDL less than 130 04/21/2014  . Kidney disease, chronic, stage III (GFR 30-59 ml/min) 04/21/2014  . Routine health maintenance  07/16/2012  . Carotid artery stenosis without cerebral infarction 01/16/2010  . Essential hypertension 10/19/2007  PHYSICAL THERAPY DISCHARGE SUMMARY   Plan: Patient agrees to discharge.  Patient goals were partially met. Patient is being discharged due to meeting the stated rehab goals.  ?????       Alan Mulder SPTA 03/06/2017, 12:55 PM  [address] [place], [place], [address] Phone: [phone]   Fax:  [phone]  Name: Claudia Lara MRN: [id number]  Date of Birth: 07-26-1932

## 2017-03-11 ENCOUNTER — Ambulatory Visit: Payer: Medicare Other | Admitting: Physical Therapy

## 2017-03-11 ENCOUNTER — Ambulatory Visit (INDEPENDENT_AMBULATORY_CARE_PROVIDER_SITE_OTHER): Payer: Self-pay

## 2017-03-11 ENCOUNTER — Ambulatory Visit (INDEPENDENT_AMBULATORY_CARE_PROVIDER_SITE_OTHER): Payer: Medicare Other | Admitting: Orthopaedic Surgery

## 2017-03-11 ENCOUNTER — Ambulatory Visit (INDEPENDENT_AMBULATORY_CARE_PROVIDER_SITE_OTHER): Payer: Medicare Other

## 2017-03-11 ENCOUNTER — Encounter (INDEPENDENT_AMBULATORY_CARE_PROVIDER_SITE_OTHER): Payer: Self-pay | Admitting: Orthopaedic Surgery

## 2017-03-11 DIAGNOSIS — Z96641 Presence of right artificial hip joint: Secondary | ICD-10-CM

## 2017-03-11 NOTE — Progress Notes (Signed)
Office Visit Note   Patient: Claudia Lara           Date of Birth: 02-Aug-1932           MRN: [id number] Visit Date: 03/11/2017              Requested by: Janith Lima, MD [address] [place], [place] [address] PCP: [name], MD   Assessment & Plan: Visit Diagnoses:  1. Presence of right artificial hip joint   Some difficulty with function of her right lower extremity and, specifically, with crossing her right leg over her left and with weakened hip flexion. I believe this is related to the nondisplaced fracture of the greater trochanter diagnosed postoperatively. Films today reveal excellent healing without change in position. I think she'll just continue to improve over the next 3-4 months with continued home exercises. Claudia Lara is not experiencing any pain  Plan: Office 3 months  Follow-Up Instructions: Return in about 3 months (around 06/10/2017).   Orders:  Orders Placed This Encounter  Procedures  . XR HIP UNILAT W OR W/O PELVIS 2-3 VIEWS RIGHT   No orders of the defined types were placed in this encounter.     Procedures: No procedures performed   Clinical Data: No additional findings.   Subjective: Chief Complaint  Patient presents with  . Right Hip - Pain, Routine Post Op    Claudia Lara is an 83 y o female s/p 14 weeks Right Hip replacement. She relates she is frustrated with PT as she is not making much progress with movement and stretching. She cannot raise her foot to put a sock on.  [name] relates that she's not having any pain and functions independently. She's had some difficulty extending her right thigh and applying her shoes as her hip feels a little tight. She is not experiencing any back pain or referred pain. She does have a right total knee replacement which also is asymptomatic. Denies fever or chills or recent trauma  HPI  Review of Systems   Objective: Vital Signs: There were no vitals taken for this  visit.  Physical Exam  Ortho Exam straight leg raise is negative bilaterally. Full extension and 107 of flexion of the right total knee replacement using the goniometer. No calf pain. Some lower extremity swelling symmetrically about both ankles painless flexion and internal/external rotation of her right hip. Actively she doesn't have as much right hip flexion as she does on the left and does have some limited rotation but, again, no pain  Specialty Comments:  No specialty comments available.  Imaging: Xr Hip Unilat W Or W/o Pelvis 2-3 Views Right  Result Date: 03/11/2017 AP the pelvis and lateral the right hip were obtained. The previously identified nondisplaced fracture of the greater trochanter appears to have healed with abundant callus. There is some ectopic calcification along the lesser trochanter which could be calcification within the piriformis tendon. No extra ectopic calcification within the joint capsule. Prosthesis appears to be in excellent position.    PMFS History: Patient Active Problem List   Diagnosis Date Noted  . Presence of right artificial hip joint 03/11/2017  . Unilateral primary osteoarthritis, right hip 11/26/2016  . S/P total hip arthroplasty 11/26/2016  . GERD (gastroesophageal reflux disease) 08/03/2015  . Hemolytic anemia ([place]) 07/12/2015  . Chronic venous insufficiency 08/10/2014  . Aortic stenosis, mild 06/29/2014  . Left ventricular diastolic dysfunction, NYHA class 1 06/29/2014  . Senile osteopenia 04/21/2014  .  Hyperlipidemia with target LDL less than 130 04/21/2014  . Kidney disease, chronic, stage III (GFR 30-59 ml/min) 04/21/2014  . Routine health maintenance 07/16/2012  . Carotid artery stenosis without cerebral infarction 01/16/2010  . Essential hypertension 10/19/2007   Past Medical History:  Diagnosis Date  . ANEMIA 08/22/2009  . CAROTID ARTERY STENOSIS 01/16/2010  . Complication of anesthesia    very hard to wake up from surgery  .  DYSPNEA ON EXERTION 11/16/2007  . Gallstones   . GEN OSTEOARTHROSIS INVOLVING MULTIPLE SITES 11/11/2008  . GERD (gastroesophageal reflux disease)   . HEMORRHOIDS, INTERNAL    surgery was in the 90's (per patient)  . NECK PAIN, ACUTE 12/28/2009  . PERIPHERAL EDEMA 10/06/2007  . PVD 10/06/2007  . Unspecified essential hypertension 10/19/2007    Family History  Problem Relation Age of Onset  . Ovarian cancer Mother   . Cancer Mother   . Leukemia Father   . Lung cancer Father   . Cancer Father   . Cancer Brother   . Diabetes Neg Hx   . Heart disease Neg Hx   . Hypertension Neg Hx     Past Surgical History:  Procedure Laterality Date  . ABDOMINAL HYSTERECTOMY    . BUNIONECTOMY     bilat  . CHOLECYSTECTOMY  01/29/2016   at [facility]  . ENDOSCOPIC RETROGRADE CHOLANGIOPANCREATOGRAPHY (ERCP) WITH PROPOFOL N/A 12/01/2015   Procedure: ENDOSCOPIC RETROGRADE CHOLANGIOPANCREATOGRAPHY (ERCP) WITH PROPOFOL;  Surgeon: [name], MD;  [facility];  Service: Endoscopy;  Laterality: N/A;  . ENDOSCOPIC RETROGRADE CHOLANGIOPANCREATOGRAPHY (ERCP) WITH PROPOFOL N/A 02/01/2016   Procedure: ENDOSCOPIC RETROGRADE CHOLANGIOPANCREATOGRAPHY (ERCP) WITH PROPOFOL;  Surgeon: [name], MD;  [facility];  Service: Endoscopy;  Laterality: N/A;  . HAMMER TOE SURGERY    . Hyperplastic colon polyps, removed  2007   By Dr. Penelope Coop  . JOINT REPLACEMENT  2011   right knee  . OOPHORECTOMY    . right hip replacement    . ROTATOR CUFF REPAIR  2004   left (Dr. Durward Fortes)  . TOTAL HIP ARTHROPLASTY Right 11/26/2016   Procedure: RIGHT TOTAL HIP ARTHROPLASTY;  Surgeon: Garald Balding, MD;  Location: [facility];  Service: Orthopedics;  Laterality: Right;  . TOTAL KNEE ARTHROPLASTY Right    Social History   Occupational History  . Network engineer Retired    retired   Social History Main Topics  . Smoking status: Never Smoker  . Smokeless tobacco: Never Used  . Alcohol use No  .  Drug use: No  . Sexual activity: Not Currently

## 2017-03-13 ENCOUNTER — Ambulatory Visit: Payer: Medicare Other | Admitting: Physical Therapy

## 2017-04-04 ENCOUNTER — Other Ambulatory Visit: Payer: Self-pay | Admitting: Internal Medicine

## 2017-04-04 DIAGNOSIS — M159 Polyosteoarthritis, unspecified: Secondary | ICD-10-CM

## 2017-04-08 ENCOUNTER — Encounter (INDEPENDENT_AMBULATORY_CARE_PROVIDER_SITE_OTHER): Payer: Self-pay | Admitting: Physical Medicine and Rehabilitation

## 2017-04-08 ENCOUNTER — Ambulatory Visit (INDEPENDENT_AMBULATORY_CARE_PROVIDER_SITE_OTHER): Payer: Medicare Other | Admitting: Physical Medicine and Rehabilitation

## 2017-04-08 VITALS — BP 151/61 | HR 66

## 2017-04-08 DIAGNOSIS — M47816 Spondylosis without myelopathy or radiculopathy, lumbar region: Secondary | ICD-10-CM | POA: Diagnosis not present

## 2017-04-08 DIAGNOSIS — M545 Low back pain: Secondary | ICD-10-CM | POA: Diagnosis not present

## 2017-04-08 DIAGNOSIS — G5601 Carpal tunnel syndrome, right upper limb: Secondary | ICD-10-CM | POA: Diagnosis not present

## 2017-04-08 DIAGNOSIS — R202 Paresthesia of skin: Secondary | ICD-10-CM

## 2017-04-08 DIAGNOSIS — M542 Cervicalgia: Secondary | ICD-10-CM

## 2017-04-08 DIAGNOSIS — G8929 Other chronic pain: Secondary | ICD-10-CM

## 2017-04-08 NOTE — Progress Notes (Deleted)
Bilateral hand numbness. Reports this is the biggest problem and that she would have been in sooner, but she had a hip replacement in January.  Also has lower back pain on both sides which does not radiate and is worse when first getting up in the morning.

## 2017-04-08 NOTE — Progress Notes (Signed)
MELITA VILLALONA - 83 y.o. female MRN [id number]  Date of birth: [birth year]  Office Visit Note: Visit Date: 04/08/2017 PCP: Janith Lima, MD Referred by: Janith Lima, MD  Subjective: Chief Complaint  Patient presents with  . Neck - Pain  . Lower Back - Pain   HPI: Mrs. Bertini is an 83 year old female that I haven't seen since 2016. She is followed by Dr. Durward Fortes and has had a right total hip replacement since I've seen her last. By way of review in the past we have completed facet joint blocks of her lumbar spine which gave her some relief of her back pain at that time. We also completed cervical epidural injection for what was felt to be more of a radicular shoulder and arm pain with hand numbness. At the time I felt like she probably had some component of carpal tunnel syndrome vertically on the right but she did do well with cervical epidural injection. MRI of the cervical and lumbar spine had been performed at that time and this is reviewed again below. The cervical spine is more problematic with moderate stenosis at C5-6. She has not had prior electrodiagnostic studies. She comes in today with a complaint of right hand numbness and tingling in the median nerve distribution. She does not have any radicular complaints down the shoulder to the arm. She does get some radiating symptoms of poor to the shoulder. She says they're fairly constant symptoms but worse at night and worse with driving. He does have to shake her hand at times because of the tingling feeling. She reports difficulty manipulating small objects. Her bilateral hands have significant osteoarthritis and this is been ongoing for some time. The left hand gets some tingling numbness but very infrequently. She does endorse some neck pain but with no new trauma or associated headaches are again no radicular complaints.  Her low back pain is also been problematic. It's axial back pain that spreads from the [place]. She gets  mostly pain with going from sit to stand and with standing. She can sit without difficulty. She does get some pain with rotation. She has had therapy in the past with her back and her hip. She is now having right total hip arthroplasty. She does not have any radicular pain down the leg. She's had no focal weakness but feels like her back and legs give way at times. She has not had any recent falls or trauma. She has taken anti-inflammatories and Tylenol. She does use some pain medication without much relief. All these symptoms and just worsening over the last several months to the point where she wanted to have this looked at.    Review of Systems  Constitutional: Negative for chills, fever, malaise/fatigue and weight loss.  HENT: Negative for hearing loss and sinus pain.   Eyes: Negative for blurred vision, double vision and photophobia.  Respiratory: Negative for cough and shortness of breath.   Cardiovascular: Negative for chest pain, palpitations and leg swelling.  Gastrointestinal: Negative for abdominal pain, nausea and vomiting.  Genitourinary: Negative for flank pain.  Musculoskeletal: Positive for back pain and neck pain. Negative for myalgias.  Skin: Negative for itching and rash.  Neurological: Positive for tingling and focal weakness. Negative for tremors and weakness.  Endo/Heme/Allergies: Negative.   Psychiatric/Behavioral: Negative for depression.  All other systems reviewed and are negative.  Otherwise per HPI.  Assessment & Plan: Visit Diagnoses:  1. Paresthesia of skin   2. Carpal tunnel  syndrome, right upper limb   3. Cervicalgia   4. Chronic bilateral low back pain without sciatica   5. Spondylosis without myelopathy or radiculopathy, lumbar region     Plan: Findings:  Physical exam and clinical history 0.2 significant likely severe median nerve neuropathy or carpal tunnel syndrome on the right. She does have some atrophy of the right APB and has decreased sensation in  a clear median nerve distribution. She could have somewhat of a double crush type phenomenon with a C6 radiculopathy although now she is not having complaints of that. Cervical MRI from 2016 did show stenosis. She reports again more hand pain and numbness and weakness than anything with her neck. I think the next step is electrodiagnostic studies on the right and I would likely include at least a quick study of the left. Depending on those results have her follow up with Dr. Durward Fortes for possible surgical decompression. In terms of her low back pain it seems to be mechanical and facet mediated pain. She does have scoliosis and facet arthropathy. MRI evidence of not much in the way of real significant stenosis. We did not get x-rays today as it was not anything acute as is been ongoing pain and similar. There is no radicular pattern. I think the best approach here is diagnostic and therapeutic facet joint blocks. She would be a good candidate for radiofrequency ablation. We would like to probably regroup with physical therapy for short course. She'll continue current medications. I spent more than 25 minutes speaking face-to-face with the patient with 50% of the time in counseling.    Meds & Orders: No orders of the defined types were placed in this encounter.  No orders of the defined types were placed in this encounter.   Follow-up: Return for Right upper extremity electrodiagnostic study and facet joint block of the lumbar spine..   Procedures: No procedures performed  No notes on file   Clinical History: Cervical spine MRI 01/06/2015  IMPRESSION: Severe, diffuse disc degeneration and facet arthrosis in the cervical spine, worst at C5-6 where there is moderate spinal stenosis and severe right neural foraminal stenosis.   Lumbar spine MRI 10/18/2015  L2-3: A broad-based disc protrusion moderate facet hypertrophy are noted bilaterally. Mild subarticular and foraminal narrowing is present  bilaterally.  L3-4: A broad-based disc protrusion is present. Moderate facet hypertrophy is worse on the right. Moderate right and mild left subarticular narrowing is present. Mild to moderate right and mild left foraminal narrowing is present.  L4-5: A broad-based disc protrusion is present. Moderate facet hypertrophy is noted. This results in moderate subarticular narrowing bilaterally. Mild to moderate right and mild left foraminal stenosis is present.  L5-S1: A broad-based disc protrusion is present. Facet hypertrophy is worse on the left. Mild subarticular narrowing is worse on the left. Mild right and moderate left foraminal stenosis is present.  IMPRESSION: 1. Multilevel spondylosis as described. 2. Levoconvex curvature of the lumbar spine is centered at L4. 3. Mild subarticular and foraminal narrowing bilaterally at L1-2 is worse on the left. 4. Mild subarticular and foraminal narrowing bilaterally at L2-3. 5. Moderate right and mild left subarticular narrowing at L3-4. Foraminal narrowing is mild to moderate on the right and mild on the left. 6. Moderate subarticular narrowing bilaterally at L4-5. Foraminal narrowing is mild-to-moderate on the right and mild on the left. 7. Mild subarticular narrowing at L5-S1 is worse on the left. Moderate left and mild right foraminal narrowing is present at this level.  She reports that she has never smoked. She has never used smokeless tobacco. No results for input(s): HGBA1C, LABURIC in the last 8760 hours.  Objective:  VS:  HT:    WT:   BMI:     BP:(!) 151/61  HR:66bpm  TEMP: ( )  RESP:  Physical Exam  Constitutional: She is oriented to person, place, and time. She appears well-developed and well-nourished. No distress.  HENT:  Head: Normocephalic and atraumatic.  Nose: Nose normal.  Mouth/Throat: Oropharynx is clear and moist.  Eyes: Conjunctivae are normal. Pupils are equal, round, and reactive to light.  Neck: Neck  supple.  Cardiovascular: Regular rhythm and intact distal pulses.   Pulmonary/Chest: Effort normal. No respiratory distress.  Abdominal: She exhibits no distension. There is no guarding.  Musculoskeletal:  Cervical range of motion is limited in ranges of rotation and extension. She has a negative Spurling's test. She has no shoulder impingement to a degree. She has clear atrophy of the right APB compared to the left. She has no active synovitis but she has significant deformities of the distal and proximal interphalangeal joints. She does have numbness and impaired sensation to light touch in a classic median nerve distribution on the right but not the left. She has numbness on the radial side of the fourth digit but not only ulnar side. Her lumbar spine shows pain with extension. She does have scoliosis. She has no pain over the greater trochanters no pain with hip rotation. She has good distal strength without clonus.  Lymphadenopathy:    She has no cervical adenopathy.  Neurological: She is alert and oriented to person, place, and time. She exhibits normal muscle tone. Coordination normal.  Skin: Skin is warm. No rash noted. No erythema.  Psychiatric: She has a normal mood and affect. Her behavior is normal.  Nursing note and vitals reviewed.   Ortho Exam Imaging: No results found.  Past Medical/Family/Surgical/Social History: Medications & Allergies reviewed per EMR Patient Active Problem List   Diagnosis Date Noted  . Spondylosis without myelopathy or radiculopathy, lumbar region 04/09/2017  . Carpal tunnel syndrome, right upper limb 04/09/2017  . Presence of right artificial hip joint 03/11/2017  . Unilateral primary osteoarthritis, right hip 11/26/2016  . S/P total hip arthroplasty 11/26/2016  . GERD (gastroesophageal reflux disease) 08/03/2015  . Hemolytic anemia ([place]) 07/12/2015  . Chronic venous insufficiency 08/10/2014  . Aortic stenosis, mild 06/29/2014  . Left ventricular  diastolic dysfunction, NYHA class 1 06/29/2014  . Senile osteopenia 04/21/2014  . Hyperlipidemia with target LDL less than 130 04/21/2014  . Kidney disease, chronic, stage III (GFR 30-59 ml/min) 04/21/2014  . Routine health maintenance 07/16/2012  . Carotid artery stenosis without cerebral infarction 01/16/2010  . Essential hypertension 10/19/2007   Past Medical History:  Diagnosis Date  . ANEMIA 08/22/2009  . CAROTID ARTERY STENOSIS 01/16/2010  . Complication of anesthesia    very hard to wake up from surgery  . DYSPNEA ON EXERTION 11/16/2007  . Gallstones   . GEN OSTEOARTHROSIS INVOLVING MULTIPLE SITES 11/11/2008  . GERD (gastroesophageal reflux disease)   . HEMORRHOIDS, INTERNAL    surgery was in the 90's (per patient)  . NECK PAIN, ACUTE 12/28/2009  . PERIPHERAL EDEMA 10/06/2007  . PVD 10/06/2007  . Unspecified essential hypertension 10/19/2007   Family History  Problem Relation Age of Onset  . Ovarian cancer Mother   . Cancer Mother   . Leukemia Father   . Lung cancer Father   . Cancer Father   .  Cancer Brother   . Diabetes Neg Hx   . Heart disease Neg Hx   . Hypertension Neg Hx    Past Surgical History:  Procedure Laterality Date  . ABDOMINAL HYSTERECTOMY    . BUNIONECTOMY     bilat  . CHOLECYSTECTOMY  01/29/2016   at [facility]  . ENDOSCOPIC RETROGRADE CHOLANGIOPANCREATOGRAPHY (ERCP) WITH PROPOFOL N/A 12/01/2015   Procedure: ENDOSCOPIC RETROGRADE CHOLANGIOPANCREATOGRAPHY (ERCP) WITH PROPOFOL;  Surgeon: [name], MD;  Location: [facility];  Service: Endoscopy;  Laterality: N/A;  . ENDOSCOPIC RETROGRADE CHOLANGIOPANCREATOGRAPHY (ERCP) WITH PROPOFOL N/A 02/01/2016   Procedure: ENDOSCOPIC RETROGRADE CHOLANGIOPANCREATOGRAPHY (ERCP) WITH PROPOFOL;  Surgeon: [name], MD;  Location: [facility];  Service: Endoscopy;  Laterality: N/A;  . HAMMER TOE SURGERY    . Hyperplastic colon polyps, removed  2007   By Dr. Penelope Coop  . JOINT REPLACEMENT  2011    right knee  . OOPHORECTOMY    . right hip replacement    . ROTATOR CUFF REPAIR  2004   left (Dr. Durward Fortes)  . TOTAL HIP ARTHROPLASTY Right 11/26/2016   Procedure: RIGHT TOTAL HIP ARTHROPLASTY;  Surgeon: Garald Balding, MD;  Location: [facility];  Service: Orthopedics;  Laterality: Right;  . TOTAL KNEE ARTHROPLASTY Right    Social History   Occupational History  . Network engineer Retired    retired   Social History Main Topics  . Smoking status: Never Smoker  . Smokeless tobacco: Never Used  . Alcohol use No  . Drug use: No  . Sexual activity: Not Currently

## 2017-04-09 ENCOUNTER — Encounter (INDEPENDENT_AMBULATORY_CARE_PROVIDER_SITE_OTHER): Payer: Self-pay | Admitting: Physical Medicine and Rehabilitation

## 2017-04-09 DIAGNOSIS — M4716 Other spondylosis with myelopathy, lumbar region: Secondary | ICD-10-CM | POA: Insufficient documentation

## 2017-04-09 DIAGNOSIS — G5601 Carpal tunnel syndrome, right upper limb: Secondary | ICD-10-CM | POA: Insufficient documentation

## 2017-04-18 ENCOUNTER — Ambulatory Visit (INDEPENDENT_AMBULATORY_CARE_PROVIDER_SITE_OTHER): Payer: Medicare Other | Admitting: Physical Medicine and Rehabilitation

## 2017-04-18 ENCOUNTER — Encounter (INDEPENDENT_AMBULATORY_CARE_PROVIDER_SITE_OTHER): Payer: Self-pay | Admitting: Physical Medicine and Rehabilitation

## 2017-04-18 DIAGNOSIS — R202 Paresthesia of skin: Secondary | ICD-10-CM | POA: Diagnosis not present

## 2017-04-18 NOTE — Progress Notes (Deleted)
Patient is here today for planned right upper extremity nerve study.

## 2017-04-18 NOTE — Procedures (Signed)
EMG & NCV Findings: Evaluation of the right median motor nerve showed prolonged distal onset latency (10.6 ms), reduced amplitude (0.5 mV), and decreased conduction velocity (Elbow-Wrist, 43 m/s).  The right median (across palm) sensory nerve showed no response (Wrist) and prolonged distal peak latency (Palm, 4.0 ms).  The right ulnar sensory nerve showed reduced amplitude (7.4 V).  All remaining nerves (as indicated in the following tables) were within normal limits.    Needle evaluation of the right abductor pollicis brevis muscle showed increased spontaneous activity and diminished recruitment.  All remaining muscles (as indicated in the following table) showed no evidence of electrical instability.    Impression:  This was an ABNORMAL electrodiagnostic study and reveals a very severe right median nerve neuropathy at the wrist (carpal tunnel syndrome) affecting the sensory and motor nerves. The lesion demonstrates sensory and motor demyelination as well as significant evidence of axonal damage. Unfortunately, despite appropriate decompressive therapy she will likely have some residual symptoms.  There is no significant electrodiagnostic evidence of other brachial plexopathy or peripheral polyneuropathy.   Recommendations: 1. Follow-up with Dr. Joni Fears whom she has seen in the past for possible surgical evaluation.   Nerve Conduction Studies Anti Sensory Summary Table   Stim Site NR Peak (ms) Norm Peak (ms) P-T Amp (V) Norm P-T Amp Site1 Site2 Delta-P (ms) Dist (cm) Vel (m/s) Norm Vel (m/s)  Right Median Acr Palm Anti Sensory (2nd Digit)  33.5C  Wrist *NR  <3.6  >10 Wrist Palm  0.0    Palm    *4.0 <2.0 11.6         Right Radial Anti Sensory (Base 1st Digit)  33C  Wrist    2.2 <3.1 12.5  Wrist Base 1st Digit 2.2 0.0    Right Ulnar Anti Sensory (5th Digit)  33.7C  Wrist    3.2 <3.7 *7.4 >15.0 Wrist 5th Digit 3.2 14.0 44 >38  B Elbow    3.5  5.3  B Elbow Wrist 0.3 0.0  >47    Motor Summary Table   Stim Site NR Onset (ms) Norm Onset (ms) O-P Amp (mV) Norm O-P Amp Site1 Site2 Delta-0 (ms) Dist (cm) Vel (m/s) Norm Vel (m/s)  Right Median Motor (Abd Poll Brev)  32.8C  Wrist    *10.6 <4.2 *0.5 >5 Elbow Wrist 4.9 21.0 *43 >50  Elbow    15.5  0.5         Right Ulnar Motor (Abd Dig Min)  32.8C  Wrist    3.0 <4.2 3.9 >3 B Elbow Wrist 3.6 22.0 61 >53  B Elbow    6.6  3.9  A Elbow B Elbow 1.4 10.0 71 >53  A Elbow    8.0  3.3          EMG   Side Muscle Nerve Root Ins Act Fibs Psw Amp Dur Poly Recrt Int Fraser Din Comment  Right Abd Poll Brev Median C8-T1 Nml *3+ *3+ Nml Nml 0 *Reduced Nml   Right 1stDorInt Ulnar C8-T1 Nml Nml Nml Nml Nml 0 Nml Nml     Nerve Conduction Studies Anti Sensory Left/Right Comparison   Stim Site L Lat (ms) R Lat (ms) L-R Lat (ms) L Amp (V) R Amp (V) L-R Amp (%) Site1 Site2 L Vel (m/s) R Vel (m/s) L-R Vel (m/s)  Median Acr Palm Anti Sensory (2nd Digit)  33.5C  Wrist       Wrist Palm     Palm  *4.0   11.6  Radial Anti Sensory (Base 1st Digit)  33C  Wrist  2.2   12.5  Wrist Base 1st Digit     Ulnar Anti Sensory (5th Digit)  33.7C  Wrist  3.2   *7.4  Wrist 5th Digit  44   B Elbow  3.5   5.3  B Elbow Wrist      Motor Left/Right Comparison   Stim Site L Lat (ms) R Lat (ms) L-R Lat (ms) L Amp (mV) R Amp (mV) L-R Amp (%) Site1 Site2 L Vel (m/s) R Vel (m/s) L-R Vel (m/s)  Median Motor (Abd Poll Brev)  32.8C  Wrist  *10.6   *0.5  Elbow Wrist  *43   Elbow  15.5   0.5        Ulnar Motor (Abd Dig Min)  32.8C  Wrist  3.0   3.9  B Elbow Wrist  61   B Elbow  6.6   3.9  A Elbow B Elbow  71   A Elbow  8.0   3.3

## 2017-04-18 NOTE — Progress Notes (Signed)
Claudia Lara - 83 y.o. female MRN [id number]  Date of birth: 1932/01/11  Office Visit Note: Visit Date: 04/18/2017 PCP: Janith Lima, MD Referred by: Janith Lima, MD  Subjective: Chief Complaint  Patient presents with  . Right Hand - Numbness   HPI: Claudia Lara is a very pleasant 83 year old right-hand dominant female who recently sought for evaluation and management of her right hand pain as well as low back pain. She is here today for electrodiagnostic study of the right upper limb. Please see our prior evaluation and management note for further details and justification.    ROS Otherwise per HPI.  Assessment & Plan: Visit Diagnoses:  1. Paresthesia of skin     Plan: No additional findings.  Impression:  This was an ABNORMAL electrodiagnostic study and reveals a very severe right median nerve neuropathy at the wrist (carpal tunnel syndrome) affecting the sensory and motor nerves. The lesion demonstrates sensory and motor demyelination as well as significant evidence of axonal damage. Unfortunately, despite appropriate decompressive therapy she will likely have some residual symptoms.  There is no significant electrodiagnostic evidence of other brachial plexopathy or peripheral polyneuropathy.   Recommendations: 1. Follow-up with Dr. Joni Fears whom she has seen in the past for possible surgical evaluation.    Meds & Orders: No orders of the defined types were placed in this encounter.   Orders Placed This Encounter  Procedures  . NCV with EMG (electromyography)    Follow-up: Return for Dr. Durward Fortes. Patient said she wanted call from home to be able to look at her scheduled.  Procedures: No procedures performed  EMG & NCV Findings: Evaluation of the right median motor nerve showed prolonged distal onset latency (10.6 ms), reduced amplitude (0.5 mV), and decreased conduction velocity (Elbow-Wrist, 43 m/s).  The right median (across palm) sensory nerve  showed no response (Wrist) and prolonged distal peak latency (Palm, 4.0 ms).  The right ulnar sensory nerve showed reduced amplitude (7.4 V).  All remaining nerves (as indicated in the following tables) were within normal limits.    Needle evaluation of the right abductor pollicis brevis muscle showed increased spontaneous activity and diminished recruitment.  All remaining muscles (as indicated in the following table) showed no evidence of electrical instability.    Impression:  This was an ABNORMAL electrodiagnostic study and reveals a very severe right median nerve neuropathy at the wrist (carpal tunnel syndrome) affecting the sensory and motor nerves. The lesion demonstrates sensory and motor demyelination as well as significant evidence of axonal damage. Unfortunately, despite appropriate decompressive therapy she will likely have some residual symptoms.  There is no significant electrodiagnostic evidence of other brachial plexopathy or peripheral polyneuropathy.   Recommendations: 2. Follow-up with Dr. Joni Fears whom she has seen in the past for possible surgical evaluation.   Nerve Conduction Studies Anti Sensory Summary Table   Stim Site NR Peak (ms) Norm Peak (ms) P-T Amp (V) Norm P-T Amp Site1 Site2 Delta-P (ms) Dist (cm) Vel (m/s) Norm Vel (m/s)  Right Median Acr Palm Anti Sensory (2nd Digit)  33.5C  Wrist *NR  <3.6  >10 Wrist Palm  0.0    Palm    *4.0 <2.0 11.6         Right Radial Anti Sensory (Base 1st Digit)  33C  Wrist    2.2 <3.1 12.5  Wrist Base 1st Digit 2.2 0.0    Right Ulnar Anti Sensory (5th Digit)  33.7C  Wrist    3.2 <  3.7 *7.4 >15.0 Wrist 5th Digit 3.2 14.0 44 >38  B Elbow    3.5  5.3  B Elbow Wrist 0.3 0.0  >47   Motor Summary Table   Stim Site NR Onset (ms) Norm Onset (ms) O-P Amp (mV) Norm O-P Amp Site1 Site2 Delta-0 (ms) Dist (cm) Vel (m/s) Norm Vel (m/s)  Right Median Motor (Abd Poll Brev)  32.8C  Wrist    *10.6 <4.2 *0.5 >5 Elbow Wrist 4.9 21.0  *43 >50  Elbow    15.5  0.5         Right Ulnar Motor (Abd Dig Min)  32.8C  Wrist    3.0 <4.2 3.9 >3 B Elbow Wrist 3.6 22.0 61 >53  B Elbow    6.6  3.9  A Elbow B Elbow 1.4 10.0 71 >53  A Elbow    8.0  3.3          EMG   Side Muscle Nerve Root Ins Act Fibs Psw Amp Dur Poly Recrt Int Fraser Din Comment  Right Abd Poll Brev Median C8-T1 Nml *3+ *3+ Nml Nml 0 *Reduced Nml   Right 1stDorInt Ulnar C8-T1 Nml Nml Nml Nml Nml 0 Nml Nml     Nerve Conduction Studies Anti Sensory Left/Right Comparison   Stim Site L Lat (ms) R Lat (ms) L-R Lat (ms) L Amp (V) R Amp (V) L-R Amp (%) Site1 Site2 L Vel (m/s) R Vel (m/s) L-R Vel (m/s)  Median Acr Palm Anti Sensory (2nd Digit)  33.5C  Wrist       Wrist Palm     Palm  *4.0   11.6        Radial Anti Sensory (Base 1st Digit)  33C  Wrist  2.2   12.5  Wrist Base 1st Digit     Ulnar Anti Sensory (5th Digit)  33.7C  Wrist  3.2   *7.4  Wrist 5th Digit  44   B Elbow  3.5   5.3  B Elbow Wrist      Motor Left/Right Comparison   Stim Site L Lat (ms) R Lat (ms) L-R Lat (ms) L Amp (mV) R Amp (mV) L-R Amp (%) Site1 Site2 L Vel (m/s) R Vel (m/s) L-R Vel (m/s)  Median Motor (Abd Poll Brev)  32.8C  Wrist  *10.6   *0.5  Elbow Wrist  *43   Elbow  15.5   0.5        Ulnar Motor (Abd Dig Min)  32.8C  Wrist  3.0   3.9  B Elbow Wrist  61   B Elbow  6.6   3.9  A Elbow B Elbow  71   A Elbow  8.0   3.3              Clinical History: Cervical spine MRI 01/06/2015  IMPRESSION: Severe, diffuse disc degeneration and facet arthrosis in the cervical spine, worst at C5-6 where there is moderate spinal stenosis and severe right neural foraminal stenosis.   Lumbar spine MRI 10/18/2015  L2-3: A broad-based disc protrusion moderate facet hypertrophy are noted bilaterally. Mild subarticular and foraminal narrowing is present bilaterally.  L3-4: A broad-based disc protrusion is present. Moderate facet hypertrophy is worse on the right. Moderate right and mild  left subarticular narrowing is present. Mild to moderate right and mild left foraminal narrowing is present.  L4-5: A broad-based disc protrusion is present. Moderate facet hypertrophy is noted. This results in moderate subarticular narrowing bilaterally. Mild to moderate right  and mild left foraminal stenosis is present.  L5-S1: A broad-based disc protrusion is present. Facet hypertrophy is worse on the left. Mild subarticular narrowing is worse on the left. Mild right and moderate left foraminal stenosis is present.  IMPRESSION: 1. Multilevel spondylosis as described. 2. Levoconvex curvature of the lumbar spine is centered at L4. 3. Mild subarticular and foraminal narrowing bilaterally at L1-2 is worse on the left. 4. Mild subarticular and foraminal narrowing bilaterally at L2-3. 5. Moderate right and mild left subarticular narrowing at L3-4. Foraminal narrowing is mild to moderate on the right and mild on the left. 6. Moderate subarticular narrowing bilaterally at L4-5. Foraminal narrowing is mild-to-moderate on the right and mild on the left. 7. Mild subarticular narrowing at L5-S1 is worse on the left. Moderate left and mild right foraminal narrowing is present at this level.  She reports that she has never smoked. She has never used smokeless tobacco. No results for input(s): HGBA1C, LABURIC in the last 8760 hours.  Objective:  VS:  HT:    WT:   BMI:     BP:   HR: bpm  TEMP: ( )  RESP:  Physical Exam  Musculoskeletal:  Again very arthritic hands bilaterally with deformity. She has no active synovitis. No swelling or edema or allodynia. She has atrophy of the right APB compared to the left. She has no atrophy of the FDI muscles bilaterally. She has decreased sensation to light touch in a median nerve distribution on the right compared to left.    Ortho Exam Imaging: No results found.  Past Medical/Family/Surgical/Social History: Medications & Allergies reviewed  per EMR Patient Active Problem List   Diagnosis Date Noted  . Spondylosis without myelopathy or radiculopathy, lumbar region 04/09/2017  . Carpal tunnel syndrome, right upper limb 04/09/2017  . Presence of right artificial hip joint 03/11/2017  . Unilateral primary osteoarthritis, right hip 11/26/2016  . S/P total hip arthroplasty 11/26/2016  . GERD (gastroesophageal reflux disease) 08/03/2015  . Hemolytic anemia ([place]) 07/12/2015  . Chronic venous insufficiency 08/10/2014  . Aortic stenosis, mild 06/29/2014  . Left ventricular diastolic dysfunction, NYHA class 1 06/29/2014  . Senile osteopenia 04/21/2014  . Hyperlipidemia with target LDL less than 130 04/21/2014  . Kidney disease, chronic, stage III (GFR 30-59 ml/min) 04/21/2014  . Routine health maintenance 07/16/2012  . Carotid artery stenosis without cerebral infarction 01/16/2010  . Essential hypertension 10/19/2007   Past Medical History:  Diagnosis Date  . ANEMIA 08/22/2009  . CAROTID ARTERY STENOSIS 01/16/2010  . Complication of anesthesia    very hard to wake up from surgery  . DYSPNEA ON EXERTION 11/16/2007  . Gallstones   . GEN OSTEOARTHROSIS INVOLVING MULTIPLE SITES 11/11/2008  . GERD (gastroesophageal reflux disease)   . HEMORRHOIDS, INTERNAL    surgery was in the 90's (per patient)  . NECK PAIN, ACUTE 12/28/2009  . PERIPHERAL EDEMA 10/06/2007  . PVD 10/06/2007  . Unspecified essential hypertension 10/19/2007   Family History  Problem Relation Age of Onset  . Ovarian cancer Mother   . Cancer Mother   . Leukemia Father   . Lung cancer Father   . Cancer Father   . Cancer Brother   . Diabetes Neg Hx   . Heart disease Neg Hx   . Hypertension Neg Hx    Past Surgical History:  Procedure Laterality Date  . ABDOMINAL HYSTERECTOMY    . BUNIONECTOMY     bilat  . CHOLECYSTECTOMY  01/29/2016   at [facility]  . ENDOSCOPIC RETROGRADE CHOLANGIOPANCREATOGRAPHY (ERCP) WITH PROPOFOL N/A 12/01/2015    Procedure: ENDOSCOPIC RETROGRADE CHOLANGIOPANCREATOGRAPHY (ERCP) WITH PROPOFOL;  Surgeon: [name], MD;  Location: [facility];  Service: Endoscopy;  Laterality: N/A;  . ENDOSCOPIC RETROGRADE CHOLANGIOPANCREATOGRAPHY (ERCP) WITH PROPOFOL N/A 02/01/2016   Procedure: ENDOSCOPIC RETROGRADE CHOLANGIOPANCREATOGRAPHY (ERCP) WITH PROPOFOL;  Surgeon: [name], MD;  Location: [facility];  Service: Endoscopy;  Laterality: N/A;  . HAMMER TOE SURGERY    . Hyperplastic colon polyps, removed  2007   By Dr. Penelope Coop  . JOINT REPLACEMENT  2011   right knee  . OOPHORECTOMY    . right hip replacement    . ROTATOR CUFF REPAIR  2004   left (Dr. Durward Fortes)  . TOTAL HIP ARTHROPLASTY Right 11/26/2016   Procedure: RIGHT TOTAL HIP ARTHROPLASTY;  Surgeon: Garald Balding, MD;  Location: [place];  Service: Orthopedics;  Laterality: Right;  . TOTAL KNEE ARTHROPLASTY Right    Social History   Occupational History  . Network engineer Retired    retired   Social History Main Topics  . Smoking status: Never Smoker  . Smokeless tobacco: Never Used  . Alcohol use No  . Drug use: No  . Sexual activity: Not Currently

## 2017-04-22 ENCOUNTER — Encounter (INDEPENDENT_AMBULATORY_CARE_PROVIDER_SITE_OTHER): Payer: Medicare Other | Admitting: Physical Medicine and Rehabilitation

## 2017-04-24 ENCOUNTER — Encounter (INDEPENDENT_AMBULATORY_CARE_PROVIDER_SITE_OTHER): Payer: Self-pay | Admitting: Orthopaedic Surgery

## 2017-04-24 ENCOUNTER — Ambulatory Visit (INDEPENDENT_AMBULATORY_CARE_PROVIDER_SITE_OTHER): Payer: Medicare Other | Admitting: Orthopaedic Surgery

## 2017-04-24 VITALS — BP 139/39 | HR 78 | Resp 14 | Ht 66.0 in | Wt 165.0 lb

## 2017-04-24 DIAGNOSIS — G5601 Carpal tunnel syndrome, right upper limb: Secondary | ICD-10-CM

## 2017-04-24 NOTE — Progress Notes (Signed)
Office Visit Note   Patient: Claudia Lara           Date of Birth: 30-Oct-1932           MRN: [id number] Visit Date: 04/24/2017              Requested by: Claudia Lima, MD [address] [place], [place] [address] PCP: Claudia Lima, MD   Assessment & Plan: Visit Diagnoses:  1. Carpal tunnel syndrome, right upper limb     Plan: Long discussion regarding diagnosis. We will schedule carpal tunnel release. We discussed surgical treatment, incision, and what she may expect postoperatively. She has very severe carpal tunnel syndrome per the EMGs and nerve conduction studies and may still have residual symptoms after decompression. She is aware that and would like to proceed  Follow-Up Instructions: Return will schedule carpal tunnel surgery.   Orders:  No orders of the defined types were placed in this encounter.  No orders of the defined types were placed in this encounter.     Procedures: No procedures performed   Clinical Data: No additional findings.   Subjective: Chief Complaint  Patient presents with  . Right Hand - Pain    Claudia Lara is n 83 y o that presents with Right hand oain possible CTS. She relates her wrist has been hurting since 3 months and becoming worse and now all fingers and thumb are tingling and numbness.  Claudia Lara was recently seen by Claudia Lara for consideration of repeat cervical epidural steroid injection. He thought there symptoms are more consistent with carpal tunnel. Nerve conduction studies and EMGs were some fairly performed demonstrating a very severe right median nerve entrapment at the wrist. She is referred back for surgical consideration. Having more and more numbness to the point of compromise. She's dropping objects. Avenue wake up at night shake her hand. Referred to Claudia Lara note  HPI  Review of Systems   Objective: Vital Signs: BP (!) 139/39   Pulse 78   Resp 14   Ht 5\' 6"  (1.676 m)   Wt 165 lb (74.8  kg)   BMI 26.63 kg/m   Physical Exam  Ortho Exam multiple joint deformities from osteoarthritis right hand. Positive Tinel's and Phalen's at the right wrist. Good capillary refill to fingers able to actively oppose thumb to little finger but with thenar atrophy.   Specialty Comments:  No specialty comments available.  Imaging: No results found.   PMFS History: Patient Active Problem List   Diagnosis Date Noted  . Spondylosis without myelopathy or radiculopathy, lumbar region 04/09/2017  . Carpal tunnel syndrome, right upper limb 04/09/2017  . Presence of right artificial hip joint 03/11/2017  . Unilateral primary osteoarthritis, right hip 11/26/2016  . S/P total hip arthroplasty 11/26/2016  . GERD (gastroesophageal reflux disease) 08/03/2015  . Hemolytic anemia ([place]) 07/12/2015  . Chronic venous insufficiency 08/10/2014  . Aortic stenosis, mild 06/29/2014  . Left ventricular diastolic dysfunction, NYHA class 1 06/29/2014  . Senile osteopenia 04/21/2014  . Hyperlipidemia with target LDL less than 130 04/21/2014  . Kidney disease, chronic, stage III (GFR 30-59 ml/min) 04/21/2014  . Routine health maintenance 07/16/2012  . Carotid artery stenosis without cerebral infarction 01/16/2010  . Essential hypertension 10/19/2007   Past Medical History:  Diagnosis Date  . ANEMIA 08/22/2009  . CAROTID ARTERY STENOSIS 01/16/2010  . Complication of anesthesia    very hard to wake up from surgery  . DYSPNEA ON  EXERTION 11/16/2007  . Gallstones   . GEN OSTEOARTHROSIS INVOLVING MULTIPLE SITES 11/11/2008  . GERD (gastroesophageal reflux disease)   . HEMORRHOIDS, INTERNAL    surgery was in the 90's (per patient)  . NECK PAIN, ACUTE 12/28/2009  . PERIPHERAL EDEMA 10/06/2007  . PVD 10/06/2007  . Unspecified essential hypertension 10/19/2007    Family History  Problem Relation Age of Onset  . Ovarian cancer Mother   . Cancer Mother   . Leukemia Father   . Lung cancer Father   . Cancer  Father   . Cancer Brother   . Diabetes Neg Hx   . Heart disease Neg Hx   . Hypertension Neg Hx     Past Surgical History:  Procedure Laterality Date  . ABDOMINAL HYSTERECTOMY    . BUNIONECTOMY     bilat  . CHOLECYSTECTOMY  01/29/2016   at [facility]  . ENDOSCOPIC RETROGRADE CHOLANGIOPANCREATOGRAPHY (ERCP) WITH PROPOFOL N/A 12/01/2015   Procedure: ENDOSCOPIC RETROGRADE CHOLANGIOPANCREATOGRAPHY (ERCP) WITH PROPOFOL;  Surgeon: Claudia Banister, MD;  Location: [facility];  Service: Endoscopy;  Laterality: N/A;  . ENDOSCOPIC RETROGRADE CHOLANGIOPANCREATOGRAPHY (ERCP) WITH PROPOFOL N/A 02/01/2016   Procedure: ENDOSCOPIC RETROGRADE CHOLANGIOPANCREATOGRAPHY (ERCP) WITH PROPOFOL;  Surgeon: Claudia Banister, MD;  Location: [facility];  Service: Endoscopy;  Laterality: N/A;  . HAMMER TOE SURGERY    . Hyperplastic colon polyps, removed  2007   By Claudia Lara  . JOINT REPLACEMENT  2011   right knee  . OOPHORECTOMY    . right hip replacement    . ROTATOR CUFF REPAIR  2004   left (Claudia Lara)  . TOTAL HIP ARTHROPLASTY Right 11/26/2016   Procedure: RIGHT TOTAL HIP ARTHROPLASTY;  Surgeon: Claudia Balding, MD;  Location: [place];  Service: Orthopedics;  Laterality: Right;  . TOTAL KNEE ARTHROPLASTY Right    Social History   Occupational History  . Network engineer Retired    retired   Social History Main Topics  . Smoking status: Never Smoker  . Smokeless tobacco: Never Used  . Alcohol use No  . Drug use: No  . Sexual activity: Not Currently     Claudia Balding, MD   Note - This record has been created using [employer].  Chart creation errors have been sought, but may not always  have been located. Such creation errors do not reflect on  the standard of medical care.

## 2017-04-25 ENCOUNTER — Other Ambulatory Visit: Payer: Self-pay | Admitting: Internal Medicine

## 2017-04-25 DIAGNOSIS — I519 Heart disease, unspecified: Secondary | ICD-10-CM

## 2017-04-25 DIAGNOSIS — I10 Essential (primary) hypertension: Secondary | ICD-10-CM

## 2017-04-25 DIAGNOSIS — R600 Localized edema: Secondary | ICD-10-CM

## 2017-04-25 DIAGNOSIS — I5189 Other ill-defined heart diseases: Secondary | ICD-10-CM

## 2017-04-28 ENCOUNTER — Ambulatory Visit (INDEPENDENT_AMBULATORY_CARE_PROVIDER_SITE_OTHER): Payer: Medicare Other | Admitting: Orthopaedic Surgery

## 2017-04-28 ENCOUNTER — Encounter: Payer: Self-pay | Admitting: Internal Medicine

## 2017-05-05 ENCOUNTER — Ambulatory Visit (INDEPENDENT_AMBULATORY_CARE_PROVIDER_SITE_OTHER): Payer: Medicare Other | Admitting: Internal Medicine

## 2017-05-05 ENCOUNTER — Encounter: Payer: Self-pay | Admitting: Internal Medicine

## 2017-05-05 ENCOUNTER — Other Ambulatory Visit (INDEPENDENT_AMBULATORY_CARE_PROVIDER_SITE_OTHER): Payer: Medicare Other

## 2017-05-05 VITALS — BP 132/60 | HR 79 | Temp 97.6°F | Resp 12 | Ht 66.0 in | Wt 170.0 lb

## 2017-05-05 DIAGNOSIS — N183 Chronic kidney disease, stage 3 unspecified: Secondary | ICD-10-CM

## 2017-05-05 DIAGNOSIS — D539 Nutritional anemia, unspecified: Secondary | ICD-10-CM

## 2017-05-05 DIAGNOSIS — I10 Essential (primary) hypertension: Secondary | ICD-10-CM | POA: Diagnosis not present

## 2017-05-05 DIAGNOSIS — D589 Hereditary hemolytic anemia, unspecified: Secondary | ICD-10-CM

## 2017-05-05 LAB — TSH: TSH: 0.84 u[IU]/mL (ref 0.35–4.50)

## 2017-05-05 LAB — CBC WITH DIFFERENTIAL/PLATELET
BASOS ABS: 0.1 10*3/uL (ref 0.0–0.1)
Basophils Relative: 0.8 % (ref 0.0–3.0)
Eosinophils Absolute: 0.6 10*3/uL (ref 0.0–0.7)
Eosinophils Relative: 7.8 % — ABNORMAL HIGH (ref 0.0–5.0)
HCT: 27.7 % — ABNORMAL LOW (ref 36.0–46.0)
Hemoglobin: 9.6 g/dL — ABNORMAL LOW (ref 12.0–15.0)
LYMPHS ABS: 1 10*3/uL (ref 0.7–4.0)
Lymphocytes Relative: 13.4 % (ref 12.0–46.0)
MCHC: 34.7 g/dL (ref 30.0–36.0)
MCV: 91.5 fl (ref 78.0–100.0)
MONO ABS: 0.5 10*3/uL (ref 0.1–1.0)
Monocytes Relative: 6.9 % (ref 3.0–12.0)
Neutro Abs: 5.3 10*3/uL (ref 1.4–7.7)
Neutrophils Relative %: 71.1 % (ref 43.0–77.0)
Platelets: 217 10*3/uL (ref 150.0–400.0)
RBC: 3.03 Mil/uL — AB (ref 3.87–5.11)
RDW: 18.9 % — ABNORMAL HIGH (ref 11.5–15.5)
WBC: 7.4 10*3/uL (ref 4.0–10.5)

## 2017-05-05 LAB — IBC PANEL
IRON: 125 ug/dL (ref 42–145)
Saturation Ratios: 51.9 % — ABNORMAL HIGH (ref 20.0–50.0)
Transferrin: 172 mg/dL — ABNORMAL LOW (ref 212.0–360.0)

## 2017-05-05 LAB — VITAMIN B12: VITAMIN B 12: 406 pg/mL (ref 211–911)

## 2017-05-05 LAB — COMPREHENSIVE METABOLIC PANEL
ALK PHOS: 66 U/L (ref 39–117)
ALT: 11 U/L (ref 0–35)
AST: 16 U/L (ref 0–37)
Albumin: 4.2 g/dL (ref 3.5–5.2)
BILIRUBIN TOTAL: 2 mg/dL — AB (ref 0.2–1.2)
BUN: 26 mg/dL — AB (ref 6–23)
CO2: 24 mEq/L (ref 19–32)
Calcium: 9.3 mg/dL (ref 8.4–10.5)
Chloride: 109 mEq/L (ref 96–112)
Creatinine, Ser: 1.2 mg/dL (ref 0.40–1.20)
GFR: 45.59 mL/min — ABNORMAL LOW (ref >60.00)
GLUCOSE: 108 mg/dL — AB (ref 70–99)
Potassium: 4.4 mEq/L (ref 3.5–5.1)
SODIUM: 140 meq/L (ref 135–145)
TOTAL PROTEIN: 6.3 g/dL (ref 6.0–8.3)

## 2017-05-05 LAB — FOLATE: Folate: >24 ng/mL (ref >5.9)

## 2017-05-05 LAB — FERRITIN: FERRITIN: 169.2 ng/mL (ref 10.0–291.0)

## 2017-05-05 LAB — LACTATE DEHYDROGENASE: LDH: 196 U/L (ref 120–250)

## 2017-05-05 NOTE — Progress Notes (Signed)
Subjective:  Patient ID: Claudia Lara, female    DOB: July 02, 1932  Age: 83 y.o. MRN: [id number]  CC: Anemia   HPI Claudia Lara presents for f/up - she complains of a 1-2 week history of intermittent episodes of dizziness and vertigo. She has chronic unchanged fatigue and shortness of breath. She's had no recent episodes of palpitations, edema, near syncope, chest pain, or DOE.  Outpatient Medications Prior to Visit  Medication Sig Dispense Refill  . Artificial Tear Ointment (DRY EYES OP) Place 1 drop into both eyes 3 (three) times daily as needed.    . Calcium Carbonate-Vit D-Min (CALCIUM 600 + MINERALS) 600-200 MG-UNIT TABS Take 1 tablet by mouth daily.     . carvedilol (COREG) 12.5 MG tablet TAKE 1 TABLET BY MOUTH 2 TIMES DAILY WITH A MEAL. 180 tablet 2  . cholecalciferol (VITAMIN D) 1000 units tablet Take 1,000 Units by mouth daily.    . meloxicam (MOBIC) 7.5 MG tablet TAKE 1 TABLET BY MOUTH DAILY 30 tablet 1  . multivitamin-iron-minerals-folic acid (CENTRUM) chewable tablet Chew 1 tablet by mouth daily. Has stopped prior to procedure    . pantoprazole (PROTONIX) 40 MG tablet TAKE 1 TABLET (40 MG TOTAL) BY MOUTH DAILY. 90 tablet 1  . torsemide (DEMADEX) 10 MG tablet TAKE 1 TABLET BY MOUTH DAILY 30 tablet 3  . vitamin C (ASCORBIC ACID) 500 MG tablet Take 500 mg by mouth daily.     No facility-administered medications prior to visit.     ROS Review of Systems  Constitutional: Positive for fatigue. Negative for activity change, appetite change, chills, diaphoresis, fever and unexpected weight change.  HENT: Negative.  Negative for trouble swallowing.   Eyes: Negative for visual disturbance.  Respiratory: Positive for shortness of breath. Negative for cough, choking, chest tightness, wheezing and stridor.   Cardiovascular: Negative for chest pain, palpitations and leg swelling.  Gastrointestinal: Negative for abdominal pain, constipation, diarrhea, nausea and vomiting.  Endocrine:  Negative.   Genitourinary: Negative.  Negative for decreased urine volume, difficulty urinating and urgency.  Musculoskeletal: Negative.  Negative for back pain and myalgias.  Skin: Negative.   Neurological: Positive for dizziness. Negative for syncope, weakness, light-headedness, numbness and headaches.  Hematological: Negative for adenopathy. Does not bruise/bleed easily.  Psychiatric/Behavioral: Negative.     Objective:  BP 132/60 (BP Location: Left Arm, Patient Position: Sitting, Cuff Size: Normal)   Pulse 79   Temp 97.6 F (36.4 C) (Oral)   Resp 12   Ht 5\' 6"  (1.676 m)   Wt 170 lb (77.1 kg)   SpO2 96%   BMI 27.44 kg/m   BP Readings from Last 3 Encounters:  05/05/17 132/60  04/24/17 (!) 139/39  04/08/17 (!) 151/61    Wt Readings from Last 3 Encounters:  05/05/17 170 lb (77.1 kg)  04/24/17 165 lb (74.8 kg)  02/17/17 168 lb 12 oz (76.5 kg)    Physical Exam  Constitutional: She is oriented to person, place, and time. No distress.  HENT:  Mouth/Throat: Oropharynx is clear and moist. No oropharyngeal exudate.  Eyes: Conjunctivae are normal. Right eye exhibits no discharge. Left eye exhibits no discharge. No scleral icterus.  Neck: Normal range of motion. Neck supple. No JVD present. No thyromegaly present.  Cardiovascular: Normal rate and regular rhythm.  Exam reveals no gallop and no friction rub.   Murmur (1/6 SEM) heard. Pulmonary/Chest: Effort normal and breath sounds normal. She has no wheezes. She has no rales.  Abdominal: Soft. Bowel  sounds are normal. She exhibits no distension and no mass. There is no rebound and no guarding.  Musculoskeletal: Normal range of motion. She exhibits edema (trace pitting edema BLE). She exhibits no tenderness or deformity.  Lymphadenopathy:    She has no cervical adenopathy.  Neurological: She is alert and oriented to person, place, and time.  Skin: Skin is warm. No rash noted. She is not diaphoretic. No erythema. No pallor.    Vitals reviewed.   Lab Results  Component Value Date   WBC 7.4 05/05/2017   HGB 9.6 (L) 05/05/2017   HCT 27.7 (L) 05/05/2017   PLT 217.0 05/05/2017   GLUCOSE 108 (H) 05/05/2017   CHOL 128 07/09/2016   TRIG 74.0 07/09/2016   HDL 62.60 07/09/2016   LDLCALC 50 07/09/2016   ALT 11 05/05/2017   AST 16 05/05/2017   NA 140 05/05/2017   K 4.4 05/05/2017   CL 109 05/05/2017   CREATININE 1.20 05/05/2017   BUN 26 (H) 05/05/2017   CO2 24 05/05/2017   TSH 0.84 05/05/2017   INR 1.66 12/02/2016   HGBA1C 5.2 03/05/2016    No results found.  Assessment & Plan:   Claudia Lara was seen today for anemia.  Diagnoses and all orders for this visit:  Deficiency anemia- her H&H have improved some so I don't think this explains her symptoms, will monitor her for vitamin deficiencies. -     CBC with Differential/Platelet; Future -     IBC panel; Future -     Folate; Future -     Ferritin; Future -     Vitamin B1; Future -     Vitamin B12; Future  Hereditary hemolytic anemia ([place])- her H&H have actually improved slightly, her haptoglobin and LDH are reassuring that she is not experiencing another hemolytic episode, I don't think this explains her symptoms. -     Haptoglobin; Future -     Lactate dehydrogenase; Future  Kidney disease, chronic, stage III (GFR 30-59 ml/min)- her renal function is stable, she will avoid nephrotoxic agents, will continue to maintain good blood pressure control. -     Comprehensive metabolic panel; Future  Essential hypertension- her blood pressure is adequately well controlled, electrolytes and renal function are stable. I don't see any evidence of fluid overload that would explain her symptoms. -     Comprehensive metabolic panel; Future -     TSH; Future   I am having Claudia Lara maintain her CALCIUM 600 + MINERALS, multivitamin-iron-minerals-folic acid, vitamin C, Artificial Tear Ointment (DRY EYES OP), cholecalciferol, carvedilol, meloxicam, pantoprazole, and  torsemide.  No orders of the defined types were placed in this encounter.    Follow-up: Return in about 3 weeks (around 05/26/2017).  Claudia Calico, MD

## 2017-05-05 NOTE — Patient Instructions (Signed)
Anemia, Nonspecific Anemia is a condition in which the concentration of red blood cells or hemoglobin in the blood is below normal. Hemoglobin is a substance in red blood cells that carries oxygen to the tissues of the body. Anemia results in not enough oxygen reaching these tissues. What are the causes? Common causes of anemia include:  Excessive bleeding. Bleeding may be internal or external. This includes excessive bleeding from periods (in women) or from the intestine.  Poor nutrition.  Chronic kidney, thyroid, and liver disease.  Bone marrow disorders that decrease red blood cell production.  Cancer and treatments for cancer.  HIV, AIDS, and their treatments.  Spleen problems that increase red blood cell destruction.  Blood disorders.  Excess destruction of red blood cells due to infection, medicines, and autoimmune disorders. What are the signs or symptoms?  Minor weakness.  Dizziness.  Headache.  Palpitations.  Shortness of breath, especially with exercise.  Paleness.  Cold sensitivity.  Indigestion.  Nausea.  Difficulty sleeping.  Difficulty concentrating. Symptoms may occur suddenly or they may develop slowly. How is this diagnosed? Additional blood tests are often needed. These help your health care provider determine the best treatment. Your health care provider will check your stool for blood and look for other causes of blood loss. How is this treated? Treatment varies depending on the cause of the anemia. Treatment can include:  Supplements of iron, vitamin B12, or folic acid.  Hormone medicines.  A blood transfusion. This may be needed if blood loss is severe.  Hospitalization. This may be needed if there is significant continual blood loss.  Dietary changes.  Spleen removal. Follow these instructions at home: Keep all follow-up appointments. It often takes many weeks to correct anemia, and having your health care provider check on your  condition and your response to treatment is very important. Get help right away if:  You develop extreme weakness, shortness of breath, or chest pain.  You become dizzy or have trouble concentrating.  You develop heavy vaginal bleeding.  You develop a rash.  You have bloody or black, tarry stools.  You faint.  You vomit up blood.  You vomit repeatedly.  You have abdominal pain.  You have a fever or persistent symptoms for more than 2-3 days.  You have a fever and your symptoms suddenly get worse.  You are dehydrated. This information is not intended to replace advice given to you by your health care provider. Make sure you discuss any questions you have with your health care provider. Document Released: 12/19/2004 Document Revised: 04/24/2016 Document Reviewed: 05/07/2013 Elsevier Interactive Patient Education  2017 Elsevier Inc.  

## 2017-05-07 DIAGNOSIS — R1010 Upper abdominal pain, unspecified: Secondary | ICD-10-CM | POA: Diagnosis not present

## 2017-05-08 DIAGNOSIS — G5601 Carpal tunnel syndrome, right upper limb: Secondary | ICD-10-CM | POA: Diagnosis not present

## 2017-05-08 LAB — HAPTOGLOBIN: HAPTOGLOBIN: 23 mg/dL — AB (ref 43–212)

## 2017-05-09 ENCOUNTER — Telehealth: Payer: Self-pay

## 2017-05-09 LAB — VITAMIN B1: Vitamin B1 (Thiamine): 21 nmol/L (ref 8–30)

## 2017-05-09 NOTE — Telephone Encounter (Signed)
FYI- Patient wanted to let Dr. Durward Fortes know that she is doing fine and thanks for checking on her!

## 2017-05-09 NOTE — Telephone Encounter (Signed)
Thanks

## 2017-05-09 NOTE — Telephone Encounter (Signed)
See below

## 2017-05-12 ENCOUNTER — Ambulatory Visit (INDEPENDENT_AMBULATORY_CARE_PROVIDER_SITE_OTHER): Payer: Medicare Other | Admitting: Orthopaedic Surgery

## 2017-05-12 DIAGNOSIS — G5601 Carpal tunnel syndrome, right upper limb: Secondary | ICD-10-CM

## 2017-05-12 NOTE — Progress Notes (Signed)
Post-Op Visit Note   Patient: Claudia Lara           Date of Birth: 12-19-1931           MRN: [id number] Visit Date: 05/12/2017 PCP: Janith Lima, MD   Assessment & Plan:  Chief Complaint: No chief complaint on file.  Visit Diagnoses:  1. Carpal tunnel syndrome, right upper limb   4 days status post right carpal tunnel release and doing well. Minimal discomfort and no use of any pain pills Plan: Dressing removed. Wound healing without evidence of a problem. Significant degenerative changes of the PIP and DIP joints diffusely. Still having some decreased sensibility tips of her fingers. Has not had to wake up at night and shake her hand. Preoperative EMGs and nerve conduction studies demonstrated severe compression of the median nerve with possible residual symptoms despite decompression. We will see back in 1 week and remove stitches  Follow-Up Instructions: Return in about 2 weeks (around 05/26/2017).   Orders:  No orders of the defined types were placed in this encounter.  No orders of the defined types were placed in this encounter.   Imaging: No results found.  PMFS History: Patient Active Problem List   Diagnosis Date Noted  . Spondylosis without myelopathy or radiculopathy, lumbar region 04/09/2017  . Carpal tunnel syndrome, right upper limb 04/09/2017  . GERD (gastroesophageal reflux disease) 08/03/2015  . Hemolytic anemia ([place]) 07/12/2015  . Deficiency anemia 06/21/2015  . Chronic venous insufficiency 08/10/2014  . Aortic stenosis, mild 06/29/2014  . Left ventricular diastolic dysfunction, NYHA class 1 06/29/2014  . Senile osteopenia 04/21/2014  . Hyperlipidemia with target LDL less than 130 04/21/2014  . Kidney disease, chronic, stage III (GFR 30-59 ml/min) 04/21/2014  . Routine health maintenance 07/16/2012  . Carotid artery stenosis without cerebral infarction 01/16/2010  . Essential hypertension 10/19/2007   Past Medical History:  Diagnosis Date  .  ANEMIA 08/22/2009  . CAROTID ARTERY STENOSIS 01/16/2010  . Complication of anesthesia    very hard to wake up from surgery  . DYSPNEA ON EXERTION 11/16/2007  . Gallstones   . GEN OSTEOARTHROSIS INVOLVING MULTIPLE SITES 11/11/2008  . GERD (gastroesophageal reflux disease)   . HEMORRHOIDS, INTERNAL    surgery was in the 90's (per patient)  . NECK PAIN, ACUTE 12/28/2009  . PERIPHERAL EDEMA 10/06/2007  . PVD 10/06/2007  . Unspecified essential hypertension 10/19/2007    Family History  Problem Relation Age of Onset  . Ovarian cancer Mother   . Cancer Mother   . Leukemia Father   . Lung cancer Father   . Cancer Father   . Cancer Brother   . Diabetes Neg Hx   . Heart disease Neg Hx   . Hypertension Neg Hx     Past Surgical History:  Procedure Laterality Date  . ABDOMINAL HYSTERECTOMY    . BUNIONECTOMY     bilat  . CHOLECYSTECTOMY  01/29/2016   at [facility]  . ENDOSCOPIC RETROGRADE CHOLANGIOPANCREATOGRAPHY (ERCP) WITH PROPOFOL N/A 12/01/2015   Procedure: ENDOSCOPIC RETROGRADE CHOLANGIOPANCREATOGRAPHY (ERCP) WITH PROPOFOL;  Surgeon: [name], MD;  Location: [facility];  Service: Endoscopy;  Laterality: N/A;  . ENDOSCOPIC RETROGRADE CHOLANGIOPANCREATOGRAPHY (ERCP) WITH PROPOFOL N/A 02/01/2016   Procedure: ENDOSCOPIC RETROGRADE CHOLANGIOPANCREATOGRAPHY (ERCP) WITH PROPOFOL;  Surgeon: [name], MD;  Location: [facility];  Service: Endoscopy;  Laterality: N/A;  . HAMMER TOE SURGERY    . Hyperplastic colon polyps, removed  2007   By Dr.  Bonner REPLACEMENT  2011   right knee  . OOPHORECTOMY    . right hip replacement    . ROTATOR CUFF REPAIR  2004   left (Dr. Durward Fortes)  . TOTAL HIP ARTHROPLASTY Right 11/26/2016   Procedure: RIGHT TOTAL HIP ARTHROPLASTY;  Surgeon: Garald Balding, MD;  Location: [place];  Service: Orthopedics;  Laterality: Right;  . TOTAL KNEE ARTHROPLASTY Right    Social History   Occupational History  . Network engineer Retired     retired   Social History Main Topics  . Smoking status: Never Smoker  . Smokeless tobacco: Never Used  . Alcohol use No  . Drug use: No  . Sexual activity: Not Currently

## 2017-05-19 ENCOUNTER — Ambulatory Visit (INDEPENDENT_AMBULATORY_CARE_PROVIDER_SITE_OTHER): Payer: Medicare Other | Admitting: Orthopaedic Surgery

## 2017-05-19 ENCOUNTER — Encounter (INDEPENDENT_AMBULATORY_CARE_PROVIDER_SITE_OTHER): Payer: Self-pay | Admitting: Orthopaedic Surgery

## 2017-05-19 VITALS — BP 165/89 | HR 84 | Ht 66.0 in | Wt 165.0 lb

## 2017-05-19 DIAGNOSIS — G5601 Carpal tunnel syndrome, right upper limb: Secondary | ICD-10-CM

## 2017-05-19 NOTE — Progress Notes (Signed)
Office Visit Note   Patient: Claudia Lara           Date of Birth: [birth year]           MRN: [id number] Visit Date: 05/19/2017              Requested by: Janith Lima, MD [address], [place] [address] PCP: Janith Lima, MD   Assessment & Plan: Visit Diagnoses:  1. Carpal tunnel syndrome, right upper limb   11 days status post right carpal tunnel release and doing well. Still has some tingling in the tips of her fingers as expected. She had severe carpal tunnel symptoms by EMGs and nerve conduction studies  Plan: Remove the stitches right hand, apply Steri-Strips, worse splint for another 2 weeks. Return in 2 weeks Follow-Up Instructions: Return in about 2 weeks (around 06/02/2017).   Orders:  No orders of the defined types were placed in this encounter.  No orders of the defined types were placed in this encounter.     Procedures: No procedures performed   Clinical Data: No additional findings.   Subjective: Chief Complaint  Patient presents with  . Right Hand - Carpal Tunnel, Routine Post Op    Claudia Lara is status post 11 days Right CTR and doing well. No pain meds  Doing well postoperatively. Wound looks just fine. We'll plan on removing his stitches and apply Steri-Strips. Continue with the splint for 2 weeks.  HPI  Review of Systems   Objective: Vital Signs: BP (!) 165/89   Pulse 84   Ht 5\' 6"  (1.676 m)   Wt 165 lb (74.8 kg)   BMI 26.63 kg/m   Physical Exam  Ortho Exam right hand is warm. Excellent capillary refill to fingers. Still some tingling in the tips of her fingers but able to oppose thumb to little finger  Specialty Comments:  No specialty comments available.  Imaging: No results found.   PMFS History: Patient Active Problem List   Diagnosis Date Noted  . Spondylosis without myelopathy or radiculopathy, lumbar region 04/09/2017  . Carpal tunnel syndrome, right upper limb 04/09/2017  . GERD  (gastroesophageal reflux disease) 08/03/2015  . Hemolytic anemia ([place]) 07/12/2015  . Deficiency anemia 06/21/2015  . Chronic venous insufficiency 08/10/2014  . Aortic stenosis, mild 06/29/2014  . Left ventricular diastolic dysfunction, NYHA class 1 06/29/2014  . Senile osteopenia 04/21/2014  . Hyperlipidemia with target LDL less than 130 04/21/2014  . Kidney disease, chronic, stage III (GFR 30-59 ml/min) 04/21/2014  . Routine health maintenance 07/16/2012  . Carotid artery stenosis without cerebral infarction 01/16/2010  . Essential hypertension 10/19/2007   Past Medical History:  Diagnosis Date  . ANEMIA 08/22/2009  . CAROTID ARTERY STENOSIS 01/16/2010  . Complication of anesthesia    very hard to wake up from surgery  . DYSPNEA ON EXERTION 11/16/2007  . Gallstones   . GEN OSTEOARTHROSIS INVOLVING MULTIPLE SITES 11/11/2008  . GERD (gastroesophageal reflux disease)   . HEMORRHOIDS, INTERNAL    surgery was in the 90's (per patient)  . NECK PAIN, ACUTE 12/28/2009  . PERIPHERAL EDEMA 10/06/2007  . PVD 10/06/2007  . Unspecified essential hypertension 10/19/2007    Family History  Problem Relation Age of Onset  . Ovarian cancer Mother   . Cancer Mother   . Leukemia Father   . Lung cancer Father   . Cancer Father   . Cancer Brother   . Diabetes Neg Hx   .  Heart disease Neg Hx   . Hypertension Neg Hx     Past Surgical History:  Procedure Laterality Date  . ABDOMINAL HYSTERECTOMY    . BUNIONECTOMY     bilat  . CHOLECYSTECTOMY  01/29/2016   at [facility]  . ENDOSCOPIC RETROGRADE CHOLANGIOPANCREATOGRAPHY (ERCP) WITH PROPOFOL N/A 12/01/2015   Procedure: ENDOSCOPIC RETROGRADE CHOLANGIOPANCREATOGRAPHY (ERCP) WITH PROPOFOL;  Surgeon: [name], MD;  [facility];  Service: Endoscopy;  Laterality: N/A;  . ENDOSCOPIC RETROGRADE CHOLANGIOPANCREATOGRAPHY (ERCP) WITH PROPOFOL N/A 02/01/2016   Procedure: ENDOSCOPIC RETROGRADE CHOLANGIOPANCREATOGRAPHY (ERCP)  WITH PROPOFOL;  Surgeon: [name], MD;  [facility];  Service: Endoscopy;  Laterality: N/A;  . HAMMER TOE SURGERY    . Hyperplastic colon polyps, removed  2007   By Dr. Penelope Coop  . JOINT REPLACEMENT  2011   right knee  . OOPHORECTOMY    . right hip replacement    . ROTATOR CUFF REPAIR  2004   left (Dr. Durward Fortes)  . TOTAL HIP ARTHROPLASTY Right 11/26/2016   Procedure: RIGHT TOTAL HIP ARTHROPLASTY;  Surgeon: Garald Balding, MD;  Location: [place];  Service: Orthopedics;  Laterality: Right;  . TOTAL KNEE ARTHROPLASTY Right    Social History   Occupational History  . Network engineer Retired    retired   Social History Main Topics  . Smoking status: Never Smoker  . Smokeless tobacco: Never Used  . Alcohol use No  . Drug use: No  . Sexual activity: Not Currently     Garald Balding, MD   Note - This record has been created using [employer].  Chart creation errors have been sought, but may not always  have been located. Such creation errors do not reflect on  the standard of medical care.

## 2017-06-02 ENCOUNTER — Ambulatory Visit (INDEPENDENT_AMBULATORY_CARE_PROVIDER_SITE_OTHER): Payer: Medicare Other | Admitting: Orthopaedic Surgery

## 2017-06-02 ENCOUNTER — Ambulatory Visit (INDEPENDENT_AMBULATORY_CARE_PROVIDER_SITE_OTHER): Payer: Medicare Other

## 2017-06-02 DIAGNOSIS — G5601 Carpal tunnel syndrome, right upper limb: Secondary | ICD-10-CM

## 2017-06-02 DIAGNOSIS — Z96641 Presence of right artificial hip joint: Secondary | ICD-10-CM

## 2017-06-02 NOTE — Progress Notes (Signed)
Office Visit Note   Patient: Claudia Lara           Date of Birth: [birth year]           MRN: [id number] Visit Date: 06/02/2017              Requested by: Janith Lima, MD [address], [place] [address] PCP: Janith Lima, MD   Assessment & Plan: Visit Diagnoses:  1. Presence of right artificial hip joint   2. Carpal tunnel syndrome, right upper limb   3. Status post right hip replacement   Nearly a month status post right carpal tunnel release and doing well. Mrs. Braaten is beginning to have return of her sensation. She also is status post primary right total hip replacement in January and is doing well. She recently had a fall and was concerned about the status of her hip. Films revealed no complications  Plan: Office as needed. Doing very well with both her right hip and right carpal tunnel  Follow-Up Instructions: Return if symptoms worsen or fail to improve.   Orders:  Orders Placed This Encounter  Procedures  . XR HIP UNILAT W OR W/O PELVIS 2-3 VIEWS RIGHT   No orders of the defined types were placed in this encounter.     Procedures: No procedures performed   Clinical Data: No additional findings.   Subjective: Chief Complaint  Patient presents with  . Right Wrist - Routine Post Op    Ms. Walthers is an 83 y o that is status post 3 1/2 weeks Right CT. She relates she is doing well and she isn't wearing the splint. Also she is 5 months status post Right THA. She slipped in the shower and she fell against the faucets. Bruising  Mrs. Ramey feels like she is having return of the normal feeling to her hand still having some discomfort and tingling into the index and and thumb but other fingers appear to be almost "normal.. She recently had a fall was concerned about her right hip. She walks without a limp nor with any ambulatory aid. Eyes any numbness or tingling to her lower extremities.  HPI  Review of Systems   Objective: Vital  Signs: There were no vitals taken for this visit.  Physical Exam  Ortho Exam right carpal tunnel incision healing without problem. Has active opposition of thumb to little finger. Multiple degenerative changes in May the fingers with limited range of motion of her fingers and that is unchanged from her preoperative status. She does have some subjective decreased light touch in the index and thumb but notes that the other fingers almost "back to normal". Painless range of motion of right hip with internal/external rotation no localized areas of tenderness. Walks without a limp.  Specialty Comments:  No specialty comments available.  Imaging: Xr Hip Unilat W Or W/o Pelvis 2-3 Views Right  Result Date: 06/02/2017 AP the pelvis demonstrates the right total hip replacement to be in excellent position. No evidence of fracture of the pelvis or about the hip. No ectopic calcification near the lesser trochanter probably consistent with myositis. Capsule otherwise is clear.    PMFS History: Patient Active Problem List   Diagnosis Date Noted  . Spondylosis without myelopathy or radiculopathy, lumbar region 04/09/2017  . Carpal tunnel syndrome, right upper limb 04/09/2017  . GERD (gastroesophageal reflux disease) 08/03/2015  . Hemolytic anemia ([place]) 07/12/2015  . Deficiency anemia 06/21/2015  . Chronic venous  insufficiency 08/10/2014  . Aortic stenosis, mild 06/29/2014  . Left ventricular diastolic dysfunction, NYHA class 1 06/29/2014  . Senile osteopenia 04/21/2014  . Hyperlipidemia with target LDL less than 130 04/21/2014  . Kidney disease, chronic, stage III (GFR 30-59 ml/min) 04/21/2014  . Routine health maintenance 07/16/2012  . Carotid artery stenosis without cerebral infarction 01/16/2010  . Essential hypertension 10/19/2007   Past Medical History:  Diagnosis Date  . ANEMIA 08/22/2009  . CAROTID ARTERY STENOSIS 01/16/2010  . Complication of anesthesia    very hard to wake up from  surgery  . DYSPNEA ON EXERTION 11/16/2007  . Gallstones   . GEN OSTEOARTHROSIS INVOLVING MULTIPLE SITES 11/11/2008  . GERD (gastroesophageal reflux disease)   . HEMORRHOIDS, INTERNAL    surgery was in the 90's (per patient)  . NECK PAIN, ACUTE 12/28/2009  . PERIPHERAL EDEMA 10/06/2007  . PVD 10/06/2007  . Unspecified essential hypertension 10/19/2007    Family History  Problem Relation Age of Onset  . Ovarian cancer Mother   . Cancer Mother   . Leukemia Father   . Lung cancer Father   . Cancer Father   . Cancer Brother   . Diabetes Neg Hx   . Heart disease Neg Hx   . Hypertension Neg Hx     Past Surgical History:  Procedure Laterality Date  . ABDOMINAL HYSTERECTOMY    . BUNIONECTOMY     bilat  . CHOLECYSTECTOMY  01/29/2016   at [facility]  . ENDOSCOPIC RETROGRADE CHOLANGIOPANCREATOGRAPHY (ERCP) WITH PROPOFOL N/A 12/01/2015   Procedure: ENDOSCOPIC RETROGRADE CHOLANGIOPANCREATOGRAPHY (ERCP) WITH PROPOFOL;  Surgeon: [name], MD;  [facility];  Service: Endoscopy;  Laterality: N/A;  . ENDOSCOPIC RETROGRADE CHOLANGIOPANCREATOGRAPHY (ERCP) WITH PROPOFOL N/A 02/01/2016   Procedure: ENDOSCOPIC RETROGRADE CHOLANGIOPANCREATOGRAPHY (ERCP) WITH PROPOFOL;  Surgeon: [name], MD;  [facility];  Service: Endoscopy;  Laterality: N/A;  . HAMMER TOE SURGERY    . Hyperplastic colon polyps, removed  2007   By Dr. Penelope Coop  . JOINT REPLACEMENT  2011   right knee  . OOPHORECTOMY    . right hip replacement    . ROTATOR CUFF REPAIR  2004   left (Dr. Durward Fortes)  . TOTAL HIP ARTHROPLASTY Right 11/26/2016   Procedure: RIGHT TOTAL HIP ARTHROPLASTY;  Surgeon: Garald Balding, MD;  Location: [place];  Service: Orthopedics;  Laterality: Right;  . TOTAL KNEE ARTHROPLASTY Right    Social History   Occupational History  . Network engineer Retired    retired   Social History Main Topics  . Smoking status: Never Smoker  . Smokeless tobacco: Never Used  .  Alcohol use No  . Drug use: No  . Sexual activity: Not Currently     Garald Balding, MD   Note - This record has been created using [employer].  Chart creation errors have been sought, but may not always  have been located. Such creation errors do not reflect on  the standard of medical care.

## 2017-06-09 ENCOUNTER — Other Ambulatory Visit (INDEPENDENT_AMBULATORY_CARE_PROVIDER_SITE_OTHER): Payer: Medicare Other

## 2017-06-09 ENCOUNTER — Encounter: Payer: Self-pay | Admitting: Internal Medicine

## 2017-06-09 ENCOUNTER — Ambulatory Visit (INDEPENDENT_AMBULATORY_CARE_PROVIDER_SITE_OTHER): Payer: Medicare Other | Admitting: Internal Medicine

## 2017-06-09 VITALS — BP 140/60 | HR 70 | Temp 97.8°F | Resp 16 | Ht 66.0 in | Wt 171.1 lb

## 2017-06-09 DIAGNOSIS — D539 Nutritional anemia, unspecified: Secondary | ICD-10-CM | POA: Diagnosis not present

## 2017-06-09 DIAGNOSIS — D594 Other nonautoimmune hemolytic anemias: Secondary | ICD-10-CM | POA: Diagnosis not present

## 2017-06-09 DIAGNOSIS — N760 Acute vaginitis: Secondary | ICD-10-CM

## 2017-06-09 DIAGNOSIS — B9689 Other specified bacterial agents as the cause of diseases classified elsewhere: Secondary | ICD-10-CM

## 2017-06-09 LAB — CBC WITH DIFFERENTIAL/PLATELET
BASOS ABS: 0.1 10*3/uL (ref 0.0–0.1)
Basophils Relative: 0.8 % (ref 0.0–3.0)
EOS ABS: 0.7 10*3/uL (ref 0.0–0.7)
Eosinophils Relative: 8.6 % — ABNORMAL HIGH (ref 0.0–5.0)
HEMATOCRIT: 29.3 % — AB (ref 36.0–46.0)
HEMOGLOBIN: 10.2 g/dL — AB (ref 12.0–15.0)
Lymphocytes Relative: 13.3 % (ref 12.0–46.0)
Lymphs Abs: 1 10*3/uL (ref 0.7–4.0)
MCHC: 34.7 g/dL (ref 30.0–36.0)
MCV: 91.1 fl (ref 78.0–100.0)
MONO ABS: 0.6 10*3/uL (ref 0.1–1.0)
Monocytes Relative: 7.8 % (ref 3.0–12.0)
Neutro Abs: 5.5 10*3/uL (ref 1.4–7.7)
Neutrophils Relative %: 69.5 % (ref 43.0–77.0)
Platelets: 200 10*3/uL (ref 150.0–400.0)
RBC: 3.22 Mil/uL — AB (ref 3.87–5.11)
RDW: 19 % — AB (ref 11.5–15.5)
WBC: 7.8 10*3/uL (ref 4.0–10.5)

## 2017-06-09 LAB — FOLATE

## 2017-06-09 LAB — FERRITIN: Ferritin: 192.5 ng/mL (ref 10.0–291.0)

## 2017-06-09 LAB — VITAMIN B12: VITAMIN B 12: 496 pg/mL (ref 211–911)

## 2017-06-09 LAB — IBC PANEL
IRON: 125 ug/dL (ref 42–145)
Saturation Ratios: 46 % (ref 20.0–50.0)
TRANSFERRIN: 194 mg/dL — AB (ref 212.0–360.0)

## 2017-06-09 MED ORDER — CLINDAMYCIN PHOSPHATE 2 % VA CREA: 1.0000 | TOPICAL_CREAM | Freq: Every day | VAGINAL | 0 refills | Status: AC

## 2017-06-09 NOTE — Patient Instructions (Signed)
Anemia, Nonspecific Anemia is a condition in which the concentration of red blood cells or hemoglobin in the blood is below normal. Hemoglobin is a substance in red blood cells that carries oxygen to the tissues of the body. Anemia results in not enough oxygen reaching these tissues. What are the causes? Common causes of anemia include:  Excessive bleeding. Bleeding may be internal or external. This includes excessive bleeding from periods (in women) or from the intestine.  Poor nutrition.  Chronic kidney, thyroid, and liver disease.  Bone marrow disorders that decrease red blood cell production.  Cancer and treatments for cancer.  HIV, AIDS, and their treatments.  Spleen problems that increase red blood cell destruction.  Blood disorders.  Excess destruction of red blood cells due to infection, medicines, and autoimmune disorders. What are the signs or symptoms?  Minor weakness.  Dizziness.  Headache.  Palpitations.  Shortness of breath, especially with exercise.  Paleness.  Cold sensitivity.  Indigestion.  Nausea.  Difficulty sleeping.  Difficulty concentrating. Symptoms may occur suddenly or they may develop slowly. How is this diagnosed? Additional blood tests are often needed. These help your health care provider determine the best treatment. Your health care provider will check your stool for blood and look for other causes of blood loss. How is this treated? Treatment varies depending on the cause of the anemia. Treatment can include:  Supplements of iron, vitamin B12, or folic acid.  Hormone medicines.  A blood transfusion. This may be needed if blood loss is severe.  Hospitalization. This may be needed if there is significant continual blood loss.  Dietary changes.  Spleen removal. Follow these instructions at home: Keep all follow-up appointments. It often takes many weeks to correct anemia, and having your health care provider check on your  condition and your response to treatment is very important. Get help right away if:  You develop extreme weakness, shortness of breath, or chest pain.  You become dizzy or have trouble concentrating.  You develop heavy vaginal bleeding.  You develop a rash.  You have bloody or black, tarry stools.  You faint.  You vomit up blood.  You vomit repeatedly.  You have abdominal pain.  You have a fever or persistent symptoms for more than 2-3 days.  You have a fever and your symptoms suddenly get worse.  You are dehydrated. This information is not intended to replace advice given to you by your health care provider. Make sure you discuss any questions you have with your health care provider. Document Released: 12/19/2004 Document Revised: 04/24/2016 Document Reviewed: 05/07/2013 Elsevier Interactive Patient Education  2017 Elsevier Inc.  

## 2017-06-09 NOTE — Progress Notes (Signed)
Subjective:  Patient ID: Claudia Lara, female    DOB: May 20, 1932  Age: 83 y.o. MRN: [id number]  CC: Anemia   HPI JIA DOTTAVIO presents for f/up on anemia. She complains of chronic, unchanged fatigue. She is also concerned about a bad odor coming from her vagina. She denies vaginal discharge or bleeding. She's had no recent episodes of dysuria or hematuria.  Outpatient Medications Prior to Visit  Medication Sig Dispense Refill  . Artificial Tear Ointment (DRY EYES OP) Place 1 drop into both eyes 3 (three) times daily as needed.    . Calcium Carbonate-Vit D-Min (CALCIUM 600 + MINERALS) 600-200 MG-UNIT TABS Take 1 tablet by mouth daily.     . carvedilol (COREG) 12.5 MG tablet TAKE 1 TABLET BY MOUTH 2 TIMES DAILY WITH A MEAL. 180 tablet 2  . cholecalciferol (VITAMIN D) 1000 units tablet Take 1,000 Units by mouth daily.    . meloxicam (MOBIC) 7.5 MG tablet TAKE 1 TABLET BY MOUTH DAILY 30 tablet 1  . pantoprazole (PROTONIX) 40 MG tablet TAKE 1 TABLET (40 MG TOTAL) BY MOUTH DAILY. 90 tablet 1  . torsemide (DEMADEX) 10 MG tablet TAKE 1 TABLET BY MOUTH DAILY 30 tablet 3  . vitamin C (ASCORBIC ACID) 500 MG tablet Take 500 mg by mouth daily.    [name] amoxicillin (AMOXIL) 500 MG capsule TAKE 4 CAPSULES BY MOUTH 1 HOUR PRIOR  1  . multivitamin-iron-minerals-folic acid (CENTRUM) chewable tablet Chew 1 tablet by mouth daily. Has stopped prior to procedure     No facility-administered medications prior to visit.     ROS Review of Systems  Constitutional: Positive for fatigue. Negative for appetite change, chills, diaphoresis and unexpected weight change.  HENT: Negative.  Negative for sore throat and trouble swallowing.   Eyes: Negative.  Negative for visual disturbance.  Respiratory: Negative for chest tightness, shortness of breath and wheezing.   Cardiovascular: Negative for chest pain, palpitations and leg swelling.  Gastrointestinal: Negative for abdominal pain, constipation, diarrhea, nausea  and vomiting.  Genitourinary: Negative.  Negative for decreased urine volume, difficulty urinating, dysuria, frequency, hematuria, pelvic pain, urgency, vaginal bleeding, vaginal discharge and vaginal pain.  Musculoskeletal: Negative.  Negative for back pain and myalgias.  Skin: Negative.  Negative for color change and rash.  Allergic/Immunologic: Negative.   Neurological: Negative.  Negative for dizziness, weakness, numbness and headaches.  Hematological: Negative for adenopathy. Does not bruise/bleed easily.  Psychiatric/Behavioral: Negative.     Objective:  BP 140/60 (BP Location: Left Arm, Patient Position: Sitting, Cuff Size: Normal)   Pulse 70   Temp 97.8 F (36.6 C) (Oral)   Resp 16   Ht 5\' 6"  (1.676 m)   Wt 171 lb 2 oz (77.6 kg)   SpO2 96%   BMI 27.62 kg/m   BP Readings from Last 3 Encounters:  06/09/17 140/60  05/19/17 (!) 165/89  05/05/17 132/60    Wt Readings from Last 3 Encounters:  06/09/17 171 lb 2 oz (77.6 kg)  05/19/17 165 lb (74.8 kg)  05/05/17 170 lb (77.1 kg)    Physical Exam  Constitutional: She is oriented to person, place, and time. No distress.  HENT:  Mouth/Throat: Oropharynx is clear and moist. No oropharyngeal exudate.  Eyes: Conjunctivae are normal. Right eye exhibits no discharge. Left eye exhibits no discharge. No scleral icterus.  Neck: Normal range of motion. Neck supple. No JVD present. No thyromegaly present.  Cardiovascular: S1 normal and S2 normal.  Exam reveals no gallop and no  friction rub.   Murmur heard.  Systolic murmur is present with a grade of 1/6   No diastolic murmur is present  Pulmonary/Chest: Effort normal and breath sounds normal. No respiratory distress. She has no wheezes. She has no rales. She exhibits no tenderness.  Abdominal: Soft. Bowel sounds are normal. She exhibits no distension and no mass. There is no tenderness. There is no rebound and no guarding.  Musculoskeletal: Normal range of motion. She exhibits no  edema, tenderness or deformity.  Lymphadenopathy:    She has no cervical adenopathy.  Neurological: She is alert and oriented to person, place, and time.  Skin: Skin is warm and dry. No rash noted. She is not diaphoretic. No erythema. No pallor.  Vitals reviewed.   Lab Results  Component Value Date   WBC 7.8 06/09/2017   HGB 10.2 (L) 06/09/2017   HCT 29.3 (L) 06/09/2017   PLT 200.0 06/09/2017   GLUCOSE 108 (H) 05/05/2017   CHOL 128 07/09/2016   TRIG 74.0 07/09/2016   HDL 62.60 07/09/2016   LDLCALC 50 07/09/2016   ALT 11 05/05/2017   AST 16 05/05/2017   NA 140 05/05/2017   K 4.4 05/05/2017   CL 109 05/05/2017   CREATININE 1.20 05/05/2017   BUN 26 (H) 05/05/2017   CO2 24 05/05/2017   TSH 0.84 05/05/2017   INR 1.66 12/02/2016   HGBA1C 5.2 03/05/2016    No results found.  Assessment & Plan:   Nury was seen today for anemia.  Diagnoses and all orders for this visit:  Other non-autoimmune hemolytic anemias ([place])- her H/H have improved, she has a long-standing history of smoldering hemolytic anemia. Will continue to monitor this during future office visits. -     CBC with Differential/Platelet; Future  Deficiency anemia- her H&H have improved and her vitamin levels are normal. -     Vitamin B12; Future -     IBC panel; Future -     Vitamin B1; Future -     Folate; Future -     Ferritin; Future  BV (bacterial vaginosis)- will treat empirically with clindamycin. -     clindamycin (CLEOCIN) 2 % vaginal cream; Place 1 Applicatorful vaginally at bedtime.   I have discontinued Ms. Pourciau multivitamin-iron-minerals-folic acid and amoxicillin. I am also having her start on clindamycin. Additionally, I am having her maintain her CALCIUM 600 + MINERALS, vitamin C, Artificial Tear Ointment (DRY EYES OP), cholecalciferol, carvedilol, meloxicam, pantoprazole, torsemide, Multiple Vitamins-Minerals (CENTRUM SILVER PO), and IRON PO.  Meds ordered this encounter  Medications  .  Multiple Vitamins-Minerals (CENTRUM SILVER PO)    Sig: Take by mouth.  . IRON PO    Sig: Take 65 mg by mouth.  . clindamycin (CLEOCIN) 2 % vaginal cream    Sig: Place 1 Applicatorful vaginally at bedtime.    Dispense:  40 g    Refill:  0     Follow-up: Return in about 2 months (around 08/10/2017).  [name], MD

## 2017-06-12 ENCOUNTER — Other Ambulatory Visit: Payer: Self-pay | Admitting: Internal Medicine

## 2017-06-12 ENCOUNTER — Encounter: Payer: Self-pay | Admitting: Internal Medicine

## 2017-06-12 DIAGNOSIS — K2100 Gastro-esophageal reflux disease with esophagitis, without bleeding: Secondary | ICD-10-CM

## 2017-06-13 ENCOUNTER — Ambulatory Visit (INDEPENDENT_AMBULATORY_CARE_PROVIDER_SITE_OTHER): Payer: Medicare Other | Admitting: Orthopaedic Surgery

## 2017-06-13 LAB — VITAMIN B1: Vitamin B1 (Thiamine): 42 nmol/L — ABNORMAL HIGH (ref 8–30)

## 2017-06-22 ENCOUNTER — Other Ambulatory Visit: Payer: Self-pay | Admitting: Internal Medicine

## 2017-06-22 DIAGNOSIS — M159 Polyosteoarthritis, unspecified: Secondary | ICD-10-CM

## 2017-06-26 ENCOUNTER — Telehealth (INDEPENDENT_AMBULATORY_CARE_PROVIDER_SITE_OTHER): Payer: Self-pay

## 2017-06-26 NOTE — Telephone Encounter (Signed)
Pt would like to schedule another injection for her low back. Had bil L4-5 L5-S1 facet 09/25/15. Says pain is across back and no leg pain.

## 2017-06-26 NOTE — Telephone Encounter (Signed)
Ok to repeat last 

## 2017-06-27 NOTE — Telephone Encounter (Signed)
lmom to call us back to schedule appt on Monday

## 2017-06-30 NOTE — Telephone Encounter (Signed)
sched for 07/15/17 @ 100pm

## 2017-07-15 ENCOUNTER — Ambulatory Visit (INDEPENDENT_AMBULATORY_CARE_PROVIDER_SITE_OTHER): Payer: Medicare Other | Admitting: Physical Medicine and Rehabilitation

## 2017-07-15 ENCOUNTER — Encounter (INDEPENDENT_AMBULATORY_CARE_PROVIDER_SITE_OTHER): Payer: Self-pay | Admitting: Physical Medicine and Rehabilitation

## 2017-07-15 ENCOUNTER — Ambulatory Visit (INDEPENDENT_AMBULATORY_CARE_PROVIDER_SITE_OTHER): Payer: Medicare Other

## 2017-07-15 VITALS — BP 146/56 | HR 73

## 2017-07-15 DIAGNOSIS — G8929 Other chronic pain: Secondary | ICD-10-CM | POA: Diagnosis not present

## 2017-07-15 DIAGNOSIS — G5601 Carpal tunnel syndrome, right upper limb: Secondary | ICD-10-CM | POA: Diagnosis not present

## 2017-07-15 DIAGNOSIS — M47816 Spondylosis without myelopathy or radiculopathy, lumbar region: Secondary | ICD-10-CM | POA: Diagnosis not present

## 2017-07-15 DIAGNOSIS — M545 Low back pain: Secondary | ICD-10-CM | POA: Diagnosis not present

## 2017-07-15 DIAGNOSIS — R202 Paresthesia of skin: Secondary | ICD-10-CM

## 2017-07-15 MED ORDER — METHYLPREDNISOLONE ACETATE 80 MG/ML IJ SUSP
80.0000 mg | Freq: Once | INTRAMUSCULAR | Status: AC
Start: 2017-07-15 — End: 2017-07-15
  Administered 2017-07-15: 80 mg

## 2017-07-15 MED ORDER — LIDOCAINE HCL (PF) 1 % IJ SOLN
2.0000 mL | Freq: Once | INTRAMUSCULAR | Status: AC
  Administered 2017-07-15: 2 mL

## 2017-07-15 NOTE — Progress Notes (Deleted)
Pain across lower back. Right=left. Pain with walking and standing. Says she feels like she has to stoop over for awhile when she starts walking and it takes her awhile to get straightened up.No leg pain.

## 2017-07-15 NOTE — Patient Instructions (Signed)

## 2017-07-16 ENCOUNTER — Encounter (INDEPENDENT_AMBULATORY_CARE_PROVIDER_SITE_OTHER): Payer: Self-pay | Admitting: Physical Medicine and Rehabilitation

## 2017-07-16 NOTE — Progress Notes (Signed)
Claudia Lara - 83 y.o. female MRN [id number]  Date of birth: 1932-03-31  Office Visit Note: Visit Date: 07/15/2017 PCP: Janith Lima, MD Referred by: Janith Lima, MD  Subjective: Chief Complaint  Patient presents with  . Lower Back - Pain   HPI: Claudia Lara is a very pleasant 83 year old female that we saw her recently for evaluation of her back and hands. We completed electrodiagnostic study of the right upper extremity and she had severe carpal tunnel syndrome. She has had carpal tunnel release since I've seen it is doing fairly well with that. She continues to have some numbness in a median nerve distribution which I think may be permanent to a degree. She's had increased strength however. She comes in today with axial low back pain just as before. MRI evidence of facet arthropathy bilaterally without stenosis. No leg pain. Within a complete bilateral facet joint blocks and see how she performs with that. We could look at medial branch blocks and radiofrequency ablation in the future.  Her back pain is across the lower back worse with standing and ambulating. She does not get any radicular pain in the legs. No paresthesias or focal weakness. She's had no specific injury. She has used anti-inflammatories and Tylenol without much relief. She has had physical therapy in the past but that is something we could look at going forward.    Review of Systems  Constitutional: Negative for chills, fever, malaise/fatigue and weight loss.  HENT: Negative for hearing loss and sinus pain.   Eyes: Negative for blurred vision, double vision and photophobia.  Respiratory: Negative for cough and shortness of breath.   Cardiovascular: Negative for chest pain, palpitations and leg swelling.  Gastrointestinal: Negative for abdominal pain, nausea and vomiting.  Genitourinary: Negative for flank pain.  Musculoskeletal: Positive for back pain. Negative for myalgias.  Skin: Negative for itching and rash.    Neurological: Negative for tremors, focal weakness and weakness.  Endo/Heme/Allergies: Negative.   Psychiatric/Behavioral: Negative for depression.  All other systems reviewed and are negative.  Otherwise per HPI.  Assessment & Plan: Visit Diagnoses:  1. Spondylosis without myelopathy or radiculopathy, lumbar region   2. Chronic bilateral low back pain without sciatica   3. Paresthesia of skin   4. Carpal tunnel syndrome, right upper limb     Plan: Findings:  Chronic history of axial low back pain primarily to facet joint blocks a couple years ago. MRI evidence from 2016 showing mostly facet arthropathy with some broad disc protrusion particularly at L4-5 but no significant central canal stenosis. She has no radicular pain. She has failed conservative care including time in medications. She's been having ongoing back pain now for several months. It has worsened. We will complete diagnostic facet joint blocks today.  Continues to do well after carpal tunnel release on the right. She has no real symptoms on the left and no atrophy of the APB on the left. We would be quick to get electrodiagnostic studies that she began to get any numbness in that hand.    Meds & Orders:  Meds ordered this encounter  Medications  . lidocaine (PF) (XYLOCAINE) 1 % injection 2 mL  . methylPREDNISolone acetate (DEPO-MEDROL) injection 80 mg    Orders Placed This Encounter  Procedures  . Facet Injection  . XR C-ARM NO REPORT    Follow-up: Return if symptoms worsen or fail to improve.   Procedures: No procedures performed   Lumbar Facet Joint Intra-Articular Injection(s) with  Fluoroscopic Guidance  Patient: Claudia Lara      Date of Birth: 1932-09-29 MRN: [id number] PCP: Janith Lima, MD      Visit Date: 07/15/2017   Universal Protocol:    Date/Time: 07/15/2017  Consent Given By: the patient  Position: PRONE   Additional Comments: Vital signs were monitored before and after the  procedure. Patient was prepped and draped in the usual sterile fashion. The correct patient, procedure, and site was verified.   Injection Procedure Details:  Procedure Site One Meds Administered:  Meds ordered this encounter  Medications  . lidocaine (PF) (XYLOCAINE) 1 % injection 2 mL  . methylPREDNISolone acetate (DEPO-MEDROL) injection 80 mg     Laterality: Bilateral  Location/Site:  L4-L5 L5-S1  Needle size: 22 guage  Needle type: Spinal  Needle Placement: Articular  Findings:  -Contrast Used: 1 mL iohexol 180 mg iodine/mL   -Comments: Excellent flow of contrast producing a partial arthrogram.  Procedure Details: The fluoroscope beam is vertically oriented in AP, and the inferior recess is visualized beneath the lower pole of the inferior apophyseal process, which represents the target point for needle insertion. When direct visualization is difficult the target point is located at the medial projection of the vertebral pedicle. The region overlying each aforementioned target is locally anesthetized with a 1 to 2 ml. volume of 1% Lidocaine without Epinephrine.   The spinal needle was inserted into each of the above mentioned facet joints using biplanar fluoroscopic guidance. A 0.25 to 0.5 ml. volume of Isovue-250 was injected and a partial facet joint arthrogram was obtained. A single spot film was obtained of the resulting arthrogram.    One to 1.25 ml of the steroid/anesthetic solution was then injected into each of the facet joints noted above.   Additional Comments:  The patient tolerated the procedure well Dressing: Band-Aid    Post-procedure details: Patient was observed during the procedure. Post-procedure instructions were reviewed.  Patient left the clinic in stable condition.     Clinical History: Cervical spine MRI 01/06/2015  IMPRESSION: Severe, diffuse disc degeneration and facet arthrosis in the cervical spine, worst at C5-6 where there is  moderate spinal stenosis and severe right neural foraminal stenosis.   Lumbar spine MRI 10/18/2015  L2-3: A broad-based disc protrusion moderate facet hypertrophy are noted bilaterally. Mild subarticular and foraminal narrowing is present bilaterally.  L3-4: A broad-based disc protrusion is present. Moderate facet hypertrophy is worse on the right. Moderate right and mild left subarticular narrowing is present. Mild to moderate right and mild left foraminal narrowing is present.  L4-5: A broad-based disc protrusion is present. Moderate facet hypertrophy is noted. This results in moderate subarticular narrowing bilaterally. Mild to moderate right and mild left foraminal stenosis is present.  L5-S1: A broad-based disc protrusion is present. Facet hypertrophy is worse on the left. Mild subarticular narrowing is worse on the left. Mild right and moderate left foraminal stenosis is present.  IMPRESSION: 1. Multilevel spondylosis as described. 2. Levoconvex curvature of the lumbar spine is centered at L4. 3. Mild subarticular and foraminal narrowing bilaterally at L1-2 is worse on the left. 4. Mild subarticular and foraminal narrowing bilaterally at L2-3. 5. Moderate right and mild left subarticular narrowing at L3-4. Foraminal narrowing is mild to moderate on the right and mild on the left. 6. Moderate subarticular narrowing bilaterally at L4-5. Foraminal narrowing is mild-to-moderate on the right and mild on the left. 7. Mild subarticular narrowing at L5-S1 is worse on the left.  Moderate left and mild right foraminal narrowing is present at this level.  She reports that she has never smoked. She has never used smokeless tobacco. No results for input(s): HGBA1C, LABURIC in the last 8760 hours.  Objective:  VS:  HT:    WT:   BMI:     BP:(!) 146/56  HR:73bpm  TEMP: ( )  RESP:96 % Physical Exam  Constitutional: She is oriented to person, place, and time. She appears  well-developed and well-nourished.  Eyes: Pupils are equal, round, and reactive to light. Conjunctivae and EOM are normal.  Cardiovascular: Normal rate and intact distal pulses.   Pulmonary/Chest: Effort normal.  Musculoskeletal:  Examination of the right hand shows well-healed carpal tunnel release scar. She has significant osteoarthritis of both hands. She has improved grip strength on the right. She still has numbness in a median nerve distribution. She has pain with lumbar extension and rotation. No pain over the greater trochanters and no pain with hip rotation. She has good distal strength.  Neurological: She is alert and oriented to person, place, and time. She exhibits normal muscle tone.  Skin: Skin is warm and dry. No rash noted. No erythema.  Psychiatric: She has a normal mood and affect. Her behavior is normal.  Nursing note and vitals reviewed.   Ortho Exam Imaging: Xr C-arm No Report  Result Date: 07/15/2017 Please see Notes or Procedures tab for imaging impression.   Past Medical/Family/Surgical/Social History: Medications & Allergies reviewed per EMR Patient Active Problem List   Diagnosis Date Noted  . BV (bacterial vaginosis) 06/09/2017  . Spondylosis without myelopathy or radiculopathy, lumbar region 04/09/2017  . Carpal tunnel syndrome, right upper limb 04/09/2017  . GERD (gastroesophageal reflux disease) 08/03/2015  . Hemolytic anemia ([place]) 07/12/2015  . Deficiency anemia 06/21/2015  . Chronic venous insufficiency 08/10/2014  . Aortic stenosis, mild 06/29/2014  . Left ventricular diastolic dysfunction, NYHA class 1 06/29/2014  . Senile osteopenia 04/21/2014  . Hyperlipidemia with target LDL less than 130 04/21/2014  . Kidney disease, chronic, stage III (GFR 30-59 ml/min) 04/21/2014  . Routine health maintenance 07/16/2012  . Carotid artery stenosis without cerebral infarction 01/16/2010  . Essential hypertension 10/19/2007   Past Medical History:  Diagnosis  Date  . ANEMIA 08/22/2009  . CAROTID ARTERY STENOSIS 01/16/2010  . Complication of anesthesia    very hard to wake up from surgery  . DYSPNEA ON EXERTION 11/16/2007  . Gallstones   . GEN OSTEOARTHROSIS INVOLVING MULTIPLE SITES 11/11/2008  . GERD (gastroesophageal reflux disease)   . HEMORRHOIDS, INTERNAL    surgery was in the 90's (per patient)  . NECK PAIN, ACUTE 12/28/2009  . PERIPHERAL EDEMA 10/06/2007  . PVD 10/06/2007  . Unspecified essential hypertension 10/19/2007   Family History  Problem Relation Age of Onset  . Ovarian cancer Mother   . Cancer Mother   . Leukemia Father   . Lung cancer Father   . Cancer Father   . Cancer Brother   . Diabetes Neg Hx   . Heart disease Neg Hx   . Hypertension Neg Hx    Past Surgical History:  Procedure Laterality Date  . ABDOMINAL HYSTERECTOMY    . BUNIONECTOMY     bilat  . CHOLECYSTECTOMY  01/29/2016   at [facility]  . ENDOSCOPIC RETROGRADE CHOLANGIOPANCREATOGRAPHY (ERCP) WITH PROPOFOL N/A 12/01/2015   Procedure: ENDOSCOPIC RETROGRADE CHOLANGIOPANCREATOGRAPHY (ERCP) WITH PROPOFOL;  Surgeon: [name], MD;  Location: [facility];  Service: Endoscopy;  Laterality: N/A;  .  ENDOSCOPIC RETROGRADE CHOLANGIOPANCREATOGRAPHY (ERCP) WITH PROPOFOL N/A 02/01/2016   Procedure: ENDOSCOPIC RETROGRADE CHOLANGIOPANCREATOGRAPHY (ERCP) WITH PROPOFOL;  Surgeon: [name], MD;  Location: [facility];  Service: Endoscopy;  Laterality: N/A;  . HAMMER TOE SURGERY    . Hyperplastic colon polyps, removed  2007   By Dr. Penelope Coop  . JOINT REPLACEMENT  2011   right knee  . OOPHORECTOMY    . right hip replacement    . ROTATOR CUFF REPAIR  2004   left (Dr. Durward Fortes)  . TOTAL HIP ARTHROPLASTY Right 11/26/2016   Procedure: RIGHT TOTAL HIP ARTHROPLASTY;  Surgeon: Garald Balding, MD;  Location: [place];  Service: Orthopedics;  Laterality: Right;  . TOTAL KNEE ARTHROPLASTY Right    Social History   Occupational History  . Network engineer  Retired    retired   Social History Main Topics  . Smoking status: Never Smoker  . Smokeless tobacco: Never Used  . Alcohol use No  . Drug use: No  . Sexual activity: Not Currently

## 2017-07-16 NOTE — Procedures (Signed)
  Lumbar Facet Joint Intra-Articular Injection(s) with Fluoroscopic Guidance  Patient: Claudia Lara      Date of Birth: 01-15-1932 MRN: [id number] PCP: Janith Lima, MD      Visit Date: 07/15/2017   Universal Protocol:    Date/Time: 07/15/2017  Consent Given By: the patient  Position: PRONE   Additional Comments: Vital signs were monitored before and after the procedure. Patient was prepped and draped in the usual sterile fashion. The correct patient, procedure, and site was verified.   Injection Procedure Details:  Procedure Site One Meds Administered:  Meds ordered this encounter  Medications  . lidocaine (PF) (XYLOCAINE) 1 % injection 2 mL  . methylPREDNISolone acetate (DEPO-MEDROL) injection 80 mg     Laterality: Bilateral  Location/Site:  L4-L5 L5-S1  Needle size: 22 guage  Needle type: Spinal  Needle Placement: Articular  Findings:  -Contrast Used: 1 mL iohexol 180 mg iodine/mL   -Comments: Excellent flow of contrast producing a partial arthrogram.  Procedure Details: The fluoroscope beam is vertically oriented in AP, and the inferior recess is visualized beneath the lower pole of the inferior apophyseal process, which represents the target point for needle insertion. When direct visualization is difficult the target point is located at the medial projection of the vertebral pedicle. The region overlying each aforementioned target is locally anesthetized with a 1 to 2 ml. volume of 1% Lidocaine without Epinephrine.   The spinal needle was inserted into each of the above mentioned facet joints using biplanar fluoroscopic guidance. A 0.25 to 0.5 ml. volume of Isovue-250 was injected and a partial facet joint arthrogram was obtained. A single spot film was obtained of the resulting arthrogram.    One to 1.25 ml of the steroid/anesthetic solution was then injected into each of the facet joints noted above.   Additional Comments:  The patient tolerated the  procedure well Dressing: Band-Aid    Post-procedure details: Patient was observed during the procedure. Post-procedure instructions were reviewed.  Patient left the clinic in stable condition.

## 2017-07-31 ENCOUNTER — Encounter: Payer: Self-pay | Admitting: Internal Medicine

## 2017-07-31 ENCOUNTER — Other Ambulatory Visit (INDEPENDENT_AMBULATORY_CARE_PROVIDER_SITE_OTHER): Payer: Medicare Other

## 2017-07-31 ENCOUNTER — Ambulatory Visit (INDEPENDENT_AMBULATORY_CARE_PROVIDER_SITE_OTHER): Payer: Medicare Other | Admitting: Internal Medicine

## 2017-07-31 VITALS — BP 136/78 | HR 61 | Temp 98.6°F | Resp 16 | Ht 63.75 in | Wt 170.0 lb

## 2017-07-31 DIAGNOSIS — I350 Nonrheumatic aortic (valve) stenosis: Secondary | ICD-10-CM

## 2017-07-31 DIAGNOSIS — R0789 Other chest pain: Secondary | ICD-10-CM | POA: Diagnosis not present

## 2017-07-31 DIAGNOSIS — D598 Other acquired hemolytic anemias: Secondary | ICD-10-CM

## 2017-07-31 DIAGNOSIS — I10 Essential (primary) hypertension: Secondary | ICD-10-CM

## 2017-07-31 DIAGNOSIS — Z23 Encounter for immunization: Secondary | ICD-10-CM | POA: Diagnosis not present

## 2017-07-31 LAB — CBC WITH DIFFERENTIAL/PLATELET
Basophils Absolute: 0.1 10*3/uL (ref 0.0–0.1)
Basophils Relative: 0.7 % (ref 0.0–3.0)
EOS PCT: 4.9 % (ref 0.0–5.0)
Eosinophils Absolute: 0.5 10*3/uL (ref 0.0–0.7)
HEMATOCRIT: 29.2 % — AB (ref 36.0–46.0)
Hemoglobin: 10 g/dL — ABNORMAL LOW (ref 12.0–15.0)
LYMPHS PCT: 13.8 % (ref 12.0–46.0)
Lymphs Abs: 1.3 10*3/uL (ref 0.7–4.0)
MCHC: 34.3 g/dL (ref 30.0–36.0)
MCV: 92.2 fl (ref 78.0–100.0)
MONOS PCT: 7.8 % (ref 3.0–12.0)
Monocytes Absolute: 0.7 10*3/uL (ref 0.1–1.0)
Neutro Abs: 6.9 10*3/uL (ref 1.4–7.7)
Neutrophils Relative %: 72.8 % (ref 43.0–77.0)
Platelets: 217 10*3/uL (ref 150.0–400.0)
RBC: 3.17 Mil/uL — AB (ref 3.87–5.11)
RDW: 18.9 % — ABNORMAL HIGH (ref 11.5–15.5)
WBC: 9.5 10*3/uL (ref 4.0–10.5)

## 2017-07-31 LAB — BASIC METABOLIC PANEL
BUN: 28 mg/dL — AB (ref 6–23)
CHLORIDE: 106 meq/L (ref 96–112)
CO2: 28 mEq/L (ref 19–32)
Calcium: 9.3 mg/dL (ref 8.4–10.5)
Creatinine, Ser: 1.25 mg/dL — ABNORMAL HIGH (ref 0.40–1.20)
GFR: 43.46 mL/min — ABNORMAL LOW (ref >60.00)
GLUCOSE: 99 mg/dL (ref 70–99)
POTASSIUM: 4.7 meq/L (ref 3.5–5.1)
SODIUM: 141 meq/L (ref 135–145)

## 2017-07-31 NOTE — Progress Notes (Signed)
Subjective:  Patient ID: Claudia Lara, female    DOB: 11/01/1932  Age: 83 y.o. MRN: [id number]  CC: Anemia and Hypertension   HPI Claudia Lara presents for f/up - She complains that about a week ago she was chopping a banana and felt some heaviness in her arms. She rested and the symptoms quickly resolved. She is also had a few episodes of sweating and fatigue while cooking. Additionally, about 2 weeks ago she had a brief episode of sharp left-sided chest pain that radiated into her breast. The pain occurred at rest and has not returned. She denies any recent episodes of palpitations, chest pain, shortness of breath, edema, or near-syncope.  Outpatient Medications Prior to Visit  Medication Sig Dispense Refill  . Calcium Carbonate-Vit D-Min (CALCIUM 600 + MINERALS) 600-200 MG-UNIT TABS Take 1 tablet by mouth daily.     . carvedilol (COREG) 12.5 MG tablet TAKE 1 TABLET BY MOUTH 2 TIMES DAILY WITH A MEAL. 180 tablet 2  . cholecalciferol (VITAMIN D) 1000 units tablet Take 1,000 Units by mouth daily.    . IRON PO Take 65 mg by mouth.    . Multiple Vitamins-Minerals (CENTRUM SILVER PO) Take by mouth.    . pantoprazole (PROTONIX) 40 MG tablet TAKE 1 TABLET (40 MG TOTAL) BY MOUTH DAILY. 90 tablet 1  . vitamin C (ASCORBIC ACID) 500 MG tablet Take 500 mg by mouth daily.    . Artificial Tear Ointment (DRY EYES OP) Place 1 drop into both eyes 3 (three) times daily as needed.    . meloxicam (MOBIC) 7.5 MG tablet TAKE 1 TABLET BY MOUTH DAILY 30 tablet 1  . torsemide (DEMADEX) 10 MG tablet TAKE 1 TABLET BY MOUTH DAILY (Patient not taking: Reported on 07/31/2017) 30 tablet 3  . meloxicam (MOBIC) 7.5 MG tablet TAKE 1 TABLET BY MOUTH DAILY 90 tablet 1   No facility-administered medications prior to visit.     ROS Review of Systems  Constitutional: Positive for fatigue. Negative for activity change, appetite change, chills, diaphoresis, fever and unexpected weight change.  HENT: Negative.  Negative  for sore throat and trouble swallowing.   Eyes: Negative.   Respiratory: Negative.  Negative for cough, chest tightness, shortness of breath and wheezing.   Cardiovascular: Positive for chest pain. Negative for palpitations and leg swelling.  Gastrointestinal: Negative for abdominal pain, constipation, diarrhea, nausea and vomiting.  Endocrine: Negative.   Genitourinary: Negative.   Musculoskeletal: Negative.  Negative for back pain, myalgias and neck pain.  Skin: Negative.  Negative for color change and rash.  Allergic/Immunologic: Negative.   Neurological: Negative.  Negative for dizziness, weakness, light-headedness and headaches.  Hematological: Negative for adenopathy. Does not bruise/bleed easily.  Psychiatric/Behavioral: Negative.     Objective:  BP 136/78 (BP Location: Left Arm, Patient Position: Sitting, Cuff Size: Normal)   Pulse 61   Temp 98.6 F (37 C) (Oral)   Resp 16   Ht 5' 3.75" (1.619 m)   Wt 170 lb (77.1 kg)   SpO2 99%   BMI 29.41 kg/m   BP Readings from Last 3 Encounters:  07/31/17 136/78  07/15/17 (!) 146/56  06/09/17 140/60    Wt Readings from Last 3 Encounters:  07/31/17 170 lb (77.1 kg)  06/09/17 171 lb 2 oz (77.6 kg)  05/19/17 165 lb (74.8 kg)    Physical Exam  Constitutional: She is oriented to person, place, and time. No distress.  HENT:  Mouth/Throat: Oropharynx is clear and moist. No oropharyngeal  exudate.  Eyes: Conjunctivae are normal. Right eye exhibits no discharge. Left eye exhibits no discharge. No scleral icterus.  Neck: Normal range of motion. Neck supple. No JVD present. No thyromegaly present.  Cardiovascular: S1 normal and S2 normal.  Exam reveals no gallop and no friction rub.   Murmur heard.  Systolic murmur is present with a grade of 1/6   No diastolic murmur is present  EKG -  Sinus  Rhythm  WITHIN NORMAL LIMITS - no change from the prior EKG   Pulmonary/Chest: Effort normal and breath sounds normal. No respiratory  distress. She has no wheezes. She has no rales. She exhibits no tenderness.  Abdominal: Soft. Bowel sounds are normal. She exhibits no distension and no mass. There is no tenderness. There is no rebound and no guarding.  Musculoskeletal: Normal range of motion. She exhibits no edema, tenderness or deformity.  Lymphadenopathy:    She has no cervical adenopathy.  Neurological: She is alert and oriented to person, place, and time.  Skin: Skin is warm and dry. No rash noted. She is not diaphoretic. No erythema. No pallor.  Vitals reviewed.   Lab Results  Component Value Date   WBC 7.8 06/09/2017   HGB 10.2 (L) 06/09/2017   HCT 29.3 (L) 06/09/2017   PLT 200.0 06/09/2017   GLUCOSE 108 (H) 05/05/2017   CHOL 128 07/09/2016   TRIG 74.0 07/09/2016   HDL 62.60 07/09/2016   LDLCALC 50 07/09/2016   ALT 11 05/05/2017   AST 16 05/05/2017   NA 140 05/05/2017   K 4.4 05/05/2017   CL 109 05/05/2017   CREATININE 1.20 05/05/2017   BUN 26 (H) 05/05/2017   CO2 24 05/05/2017   TSH 0.84 05/05/2017   INR 1.66 12/02/2016   HGBA1C 5.2 03/05/2016    No results found.  Assessment & Plan:   Demari was seen today for anemia and hypertension.  Diagnoses and all orders for this visit:  Atypical chest pain- her EKG remains normal. The chest pain does not sound like angina. It's more likely to be musculoskeletal or anxiety related. -     EKG 12-Lead  Aortic stenosis, mild- I don't think any of her current symptoms are related to aortic stenosis.  Essential hypertension- her blood pressure is well controlled. Electrolytes and renal function are stable. -     Basic metabolic panel; Future  Other acquired hemolytic anemias ([employer])- her H&H are stable. I don't think this is contributing to her symptoms. -     CBC with Differential/Platelet; Future   I have discontinued Ms. Kittrell Artificial Tear Ointment (DRY EYES OP), meloxicam, and meloxicam. I am also having her maintain her CALCIUM 600 + MINERALS,  vitamin C, cholecalciferol, carvedilol, pantoprazole, torsemide, Multiple Vitamins-Minerals (CENTRUM SILVER PO), and IRON PO.  No orders of the defined types were placed in this encounter.    Follow-up: Return in about 3 weeks (around 08/21/2017).  [name], MD

## 2017-07-31 NOTE — Patient Instructions (Signed)
Anemia, Nonspecific Anemia is a condition in which the concentration of red blood cells or hemoglobin in the blood is below normal. Hemoglobin is a substance in red blood cells that carries oxygen to the tissues of the body. Anemia results in not enough oxygen reaching these tissues. What are the causes? Common causes of anemia include:  Excessive bleeding. Bleeding may be internal or external. This includes excessive bleeding from periods (in women) or from the intestine.  Poor nutrition.  Chronic kidney, thyroid, and liver disease.  Bone marrow disorders that decrease red blood cell production.  Cancer and treatments for cancer.  HIV, AIDS, and their treatments.  Spleen problems that increase red blood cell destruction.  Blood disorders.  Excess destruction of red blood cells due to infection, medicines, and autoimmune disorders. What are the signs or symptoms?  Minor weakness.  Dizziness.  Headache.  Palpitations.  Shortness of breath, especially with exercise.  Paleness.  Cold sensitivity.  Indigestion.  Nausea.  Difficulty sleeping.  Difficulty concentrating. Symptoms may occur suddenly or they may develop slowly. How is this diagnosed? Additional blood tests are often needed. These help your health care provider determine the best treatment. Your health care provider will check your stool for blood and look for other causes of blood loss. How is this treated? Treatment varies depending on the cause of the anemia. Treatment can include:  Supplements of iron, vitamin B12, or folic acid.  Hormone medicines.  A blood transfusion. This may be needed if blood loss is severe.  Hospitalization. This may be needed if there is significant continual blood loss.  Dietary changes.  Spleen removal. Follow these instructions at home: Keep all follow-up appointments. It often takes many weeks to correct anemia, and having your health care provider check on your  condition and your response to treatment is very important. Get help right away if:  You develop extreme weakness, shortness of breath, or chest pain.  You become dizzy or have trouble concentrating.  You develop heavy vaginal bleeding.  You develop a rash.  You have bloody or black, tarry stools.  You faint.  You vomit up blood.  You vomit repeatedly.  You have abdominal pain.  You have a fever or persistent symptoms for more than 2-3 days.  You have a fever and your symptoms suddenly get worse.  You are dehydrated. This information is not intended to replace advice given to you by your health care provider. Make sure you discuss any questions you have with your health care provider. Document Released: 12/19/2004 Document Revised: 04/24/2016 Document Reviewed: 05/07/2013 Elsevier Interactive Patient Education  2017 Elsevier Inc.  

## 2017-08-07 DIAGNOSIS — H524 Presbyopia: Secondary | ICD-10-CM | POA: Diagnosis not present

## 2017-08-07 DIAGNOSIS — H2513 Age-related nuclear cataract, bilateral: Secondary | ICD-10-CM | POA: Diagnosis not present

## 2017-08-13 DIAGNOSIS — H2513 Age-related nuclear cataract, bilateral: Secondary | ICD-10-CM | POA: Diagnosis not present

## 2017-08-14 ENCOUNTER — Encounter: Payer: Self-pay | Admitting: Internal Medicine

## 2017-08-14 ENCOUNTER — Ambulatory Visit (INDEPENDENT_AMBULATORY_CARE_PROVIDER_SITE_OTHER): Payer: Medicare Other | Admitting: Internal Medicine

## 2017-08-14 VITALS — BP 144/54 | HR 73 | Ht 63.75 in | Wt 172.8 lb

## 2017-08-14 DIAGNOSIS — I350 Nonrheumatic aortic (valve) stenosis: Secondary | ICD-10-CM

## 2017-08-14 DIAGNOSIS — I6523 Occlusion and stenosis of bilateral carotid arteries: Secondary | ICD-10-CM | POA: Diagnosis not present

## 2017-08-14 LAB — LIPID PANEL
CHOLESTEROL TOTAL: 123 mg/dL (ref 100–199)
Chol/HDL Ratio: 1.9 ratio (ref 0.0–4.4)
HDL: 64 mg/dL (ref >39)
LDL CALC: 45 mg/dL (ref 0–99)
TRIGLYCERIDES: 69 mg/dL (ref 0–149)
VLDL CHOLESTEROL CAL: 14 mg/dL (ref 5–40)

## 2017-08-14 NOTE — Progress Notes (Signed)
Cardiology Office Note   Date:  08/14/2017   ID:  Claudia Lara, Claudia Lara 03-08-1932, MRN [id number]  PCP:  Janith Lima, MD  Cardiologist:   Dorris Carnes, MD    F/U of AS   History of Present Illness: Pt is an 83 yo with a history of exertional dyaspnea (chronic) and AS I saw her in Nov 2017  Echo in Dec 2017 LVEF normal  AS remained mild  There was mild MR   The pt says that she has no energy and gets SOB with activity  Has been like it for years  Chronic She says she cant walk around block  But she is able to [employer]  Can do gardening     No change   Outpatient Medications Prior to Visit  Medication Sig Dispense Refill  . Calcium Carbonate-Vit D-Min (CALCIUM 600 + MINERALS) 600-200 MG-UNIT TABS Take 1 tablet by mouth daily.     . carvedilol (COREG) 12.5 MG tablet TAKE 1 TABLET BY MOUTH 2 TIMES DAILY WITH A MEAL. 180 tablet 2  . cholecalciferol (VITAMIN D) 1000 units tablet Take 1,000 Units by mouth daily.    . IRON PO Take 65 mg by mouth.    . Multiple Vitamins-Minerals (CENTRUM SILVER PO) Take by mouth.    . pantoprazole (PROTONIX) 40 MG tablet TAKE 1 TABLET (40 MG TOTAL) BY MOUTH DAILY. 90 tablet 1  . torsemide (DEMADEX) 10 MG tablet TAKE 1 TABLET BY MOUTH DAILY 30 tablet 3  . vitamin C (ASCORBIC ACID) 500 MG tablet Take 500 mg by mouth daily.     No facility-administered medications prior to visit.      Allergies:   Sulfonamide derivatives   Past Medical History:  Diagnosis Date  . ANEMIA 08/22/2009  . CAROTID ARTERY STENOSIS 01/16/2010  . Complication of anesthesia    very hard to wake up from surgery  . DYSPNEA ON EXERTION 11/16/2007  . Gallstones   . GEN OSTEOARTHROSIS INVOLVING MULTIPLE SITES 11/11/2008  . GERD (gastroesophageal reflux disease)   . HEMORRHOIDS, INTERNAL    surgery was in the 90's (per patient)  . NECK PAIN, ACUTE 12/28/2009  . PERIPHERAL EDEMA 10/06/2007  . PVD 10/06/2007  . Unspecified essential hypertension 10/19/2007    Past Surgical  History:  Procedure Laterality Date  . ABDOMINAL HYSTERECTOMY    . BUNIONECTOMY     bilat  . CHOLECYSTECTOMY  01/29/2016   at [facility]  . ENDOSCOPIC RETROGRADE CHOLANGIOPANCREATOGRAPHY (ERCP) WITH PROPOFOL N/A 12/01/2015   Procedure: ENDOSCOPIC RETROGRADE CHOLANGIOPANCREATOGRAPHY (ERCP) WITH PROPOFOL;  Surgeon: [name], MD;  Location: [facility];  Service: Endoscopy;  Laterality: N/A;  . ENDOSCOPIC RETROGRADE CHOLANGIOPANCREATOGRAPHY (ERCP) WITH PROPOFOL N/A 02/01/2016   Procedure: ENDOSCOPIC RETROGRADE CHOLANGIOPANCREATOGRAPHY (ERCP) WITH PROPOFOL;  Surgeon: [name], MD;  Location: [facility];  Service: Endoscopy;  Laterality: N/A;  . HAMMER TOE SURGERY    . Hyperplastic colon polyps, removed  2007   By Dr. Penelope Coop  . JOINT REPLACEMENT  2011   right knee  . OOPHORECTOMY    . right hip replacement    . ROTATOR CUFF REPAIR  2004   left (Dr. Durward Fortes)  . TOTAL HIP ARTHROPLASTY Right 11/26/2016   Procedure: RIGHT TOTAL HIP ARTHROPLASTY;  Surgeon: Garald Balding, MD;  Location: [place];  Service: Orthopedics;  Laterality: Right;  . TOTAL KNEE ARTHROPLASTY Right      Social History:  The patient  reports that she has never smoked.  She has never used smokeless tobacco. She reports that she does not drink alcohol or use drugs.   Family History:  The patient's family history includes Cancer in her brother, father, and mother; Leukemia in her father; Lung cancer in her father; Ovarian cancer in her mother.    ROS:  Please see the history of present illness. All other systems are reviewed and  Negative to the above problem except as noted.    PHYSICAL EXAM: VS:  BP (!) 144/54   Pulse 73   Ht 5' 3.75" (1.619 m)   Wt 172 lb 12.8 oz (78.4 kg)   SpO2 96%   BMI 29.89 kg/m   GEN: Well nourished, well developed, in no acute distress  HEENT: normal  Neck: no JVD, carotid bruits, or masses Cardiac: RRR; Gr I/VI  rubs, or gallops,no edema  Respiratory:  clear  to auscultation bilaterally, normal work of breathing GI: soft, nontender, nondistended, + BS  No hepatomegaly  MS: no deformity Moving all extremities   Skin: warm and dry, no rash Neuro:  Strength and sensation are intact Psych: euthymic mood, full affect   EKG:  EKG is not  ordered today.   Lipid Panel    Component Value Date/Time   CHOL 128 07/09/2016 1136   TRIG 74.0 07/09/2016 1136   HDL 62.60 07/09/2016 1136   CHOLHDL 2 07/09/2016 1136   VLDL 14.8 07/09/2016 1136   LDLCALC 50 07/09/2016 1136      Wt Readings from Last 3 Encounters:  08/14/17 172 lb 12.8 oz (78.4 kg)  07/31/17 170 lb (77.1 kg)  06/09/17 171 lb 2 oz (77.6 kg)      ASSESSMENT AND PLAN:  1  Aortic stenosis  Mild on last echo  Follow  NO change on exam  2  CV dz  Repeat carotid USN    2  Dyspena  I do not think this represents angina  Rel unchanged for years    4  LIpids  Excellent despite no statin    I would set f/u for 1 year      Current medicines are reviewed at length with the patient today.  The patient does not have concerns regarding medicines.  Signed, Dorris Carnes, MD  08/14/2017 11:17 AM    [address], [place], [place]  [address] Phone: [phone]; Fax: [phone]

## 2017-08-14 NOTE — Patient Instructions (Signed)
Your physician recommends that you continue on your current medications as directed. Please refer to the Current Medication list given to you today.  Your physician recommends that you return for lab work today ([facility]).  Your physician has requested that you have a carotid duplex. This test is an ultrasound of the carotid arteries in your neck. It looks at blood flow through these arteries that supply the brain with blood. Allow one hour for this exam. There are no restrictions or special instructions.  Your physician wants you to follow-up in: 1 year with Dr. Harrington Challenger.  You will receive a reminder letter in the mail two months in advance. If you don't receive a letter, please call our office to schedule the follow-up appointment.

## 2017-08-20 DIAGNOSIS — H2512 Age-related nuclear cataract, left eye: Secondary | ICD-10-CM | POA: Diagnosis not present

## 2017-08-20 DIAGNOSIS — H2511 Age-related nuclear cataract, right eye: Secondary | ICD-10-CM | POA: Diagnosis not present

## 2017-08-27 DIAGNOSIS — H2511 Age-related nuclear cataract, right eye: Secondary | ICD-10-CM | POA: Diagnosis not present

## 2017-08-29 ENCOUNTER — Ambulatory Visit (HOSPITAL_COMMUNITY)
Admission: RE | Admit: 2017-08-29 | Discharge: 2017-08-29 | Disposition: A | Discharge status: Home / Self Care | Payer: Medicare Other | Source: Ambulatory Visit | Attending: Cardiology | Admitting: Cardiology

## 2017-08-29 DIAGNOSIS — I6523 Occlusion and stenosis of bilateral carotid arteries: Secondary | ICD-10-CM

## 2017-09-24 DIAGNOSIS — L570 Actinic keratosis: Secondary | ICD-10-CM | POA: Diagnosis not present

## 2017-09-25 DIAGNOSIS — Z961 Presence of intraocular lens: Secondary | ICD-10-CM | POA: Diagnosis not present

## 2017-09-30 ENCOUNTER — Other Ambulatory Visit (INDEPENDENT_AMBULATORY_CARE_PROVIDER_SITE_OTHER): Payer: Medicare Other

## 2017-09-30 ENCOUNTER — Ambulatory Visit (INDEPENDENT_AMBULATORY_CARE_PROVIDER_SITE_OTHER): Payer: Medicare Other | Admitting: Internal Medicine

## 2017-09-30 ENCOUNTER — Encounter: Payer: Self-pay | Admitting: Internal Medicine

## 2017-09-30 VITALS — BP 160/62 | HR 61 | Temp 98.9°F | Resp 16 | Ht 63.75 in | Wt 175.8 lb

## 2017-09-30 DIAGNOSIS — D598 Other acquired hemolytic anemias: Secondary | ICD-10-CM

## 2017-09-30 DIAGNOSIS — F5101 Primary insomnia: Secondary | ICD-10-CM | POA: Diagnosis not present

## 2017-09-30 DIAGNOSIS — D539 Nutritional anemia, unspecified: Secondary | ICD-10-CM

## 2017-09-30 DIAGNOSIS — I519 Heart disease, unspecified: Secondary | ICD-10-CM

## 2017-09-30 DIAGNOSIS — I10 Essential (primary) hypertension: Secondary | ICD-10-CM

## 2017-09-30 DIAGNOSIS — I5189 Other ill-defined heart diseases: Secondary | ICD-10-CM

## 2017-09-30 LAB — CBC WITH DIFFERENTIAL/PLATELET
BASOS ABS: 0 10*3/uL (ref 0.0–0.1)
BASOS PCT: 0.6 % (ref 0.0–3.0)
EOS ABS: 0.2 10*3/uL (ref 0.0–0.7)
EOS PCT: 3.3 % (ref 0.0–5.0)
HEMATOCRIT: 28.1 % — AB (ref 36.0–46.0)
HEMOGLOBIN: 9.9 g/dL — AB (ref 12.0–15.0)
LYMPHS ABS: 1.3 10*3/uL (ref 0.7–4.0)
Lymphocytes Relative: 19.4 % (ref 12.0–46.0)
MCHC: 35.3 g/dL (ref 30.0–36.0)
MCV: 91.9 fl (ref 78.0–100.0)
MONOS PCT: 8.8 % (ref 3.0–12.0)
Monocytes Absolute: 0.6 10*3/uL (ref 0.1–1.0)
NEUTROS ABS: 4.5 10*3/uL (ref 1.4–7.7)
Neutrophils Relative %: 67.9 % (ref 43.0–77.0)
Platelets: 212 10*3/uL (ref 150.0–400.0)
RBC: 3.06 Mil/uL — ABNORMAL LOW (ref 3.87–5.11)
RDW: 19.1 % — ABNORMAL HIGH (ref 11.5–15.5)
WBC: 6.6 10*3/uL (ref 4.0–10.5)

## 2017-09-30 LAB — VITAMIN B12: Vitamin B-12: 468 pg/mL (ref 211–911)

## 2017-09-30 MED ORDER — SUVOREXANT 10 MG PO TABS
1.0000 | ORAL_TABLET | Freq: Every evening | ORAL | 5 refills | Status: DC | PRN
Start: 2017-09-30 — End: 2018-04-02

## 2017-09-30 NOTE — Progress Notes (Signed)
Subjective:  Patient ID: Claudia Lara, female    DOB: March 14, 1932  Age: 83 y.o. MRN: [id number]  CC: Anemia   HPI Claudia Lara presents for f/up - she continues to complain of chronic shortness of breath, fatigue, and DOE.  She complains of insomnia with difficulty falling asleep and frequent awakenings.  She complains of fatigue and thinks she would be feel better if she could get a good night of sleep.  She has gained 3 pounds over the last month but denies edema  Outpatient Medications Prior to Visit  Medication Sig Dispense Refill  . Calcium Carbonate-Vit D-Min (CALCIUM 600 + MINERALS) 600-200 MG-UNIT TABS Take 1 tablet by mouth daily.     . carvedilol (COREG) 12.5 MG tablet TAKE 1 TABLET BY MOUTH 2 TIMES DAILY WITH A MEAL. 180 tablet 2  . cholecalciferol (VITAMIN D) 1000 units tablet Take 1,000 Units by mouth daily.    . DUREZOL 0.05 % EMUL INSTIL 1 DROP IN OPERATIVE EYES TWICE A DAY FOR 3 WEEKS FOLLOWING SURGERY  1  . IRON PO Take 65 mg by mouth.    . meloxicam (MOBIC) 7.5 MG tablet Take 7.5 mg by mouth daily.     . Multiple Vitamins-Minerals (CENTRUM SILVER PO) Take by mouth.    [name] ofloxacin (OCUFLOX) 0.3 % ophthalmic solution START 2 DAYS PRIOR TO SURGERY. 1 DROP IN OPERATIVE EYE TWICE DAILY AND 3 WEEKS FOLLOWING SURGERY  0  . pantoprazole (PROTONIX) 40 MG tablet TAKE 1 TABLET (40 MG TOTAL) BY MOUTH DAILY. 90 tablet 1  . torsemide (DEMADEX) 10 MG tablet TAKE 1 TABLET BY MOUTH DAILY 30 tablet 3  . vitamin C (ASCORBIC ACID) 500 MG tablet Take 500 mg by mouth daily.     No facility-administered medications prior to visit.     ROS Review of Systems  Constitutional: Positive for fatigue and unexpected weight change. Negative for appetite change and diaphoresis.  HENT: Negative.   Eyes: Negative for visual disturbance.  Respiratory: Positive for shortness of breath. Negative for cough, chest tightness and wheezing.   Cardiovascular: Negative.  Negative for chest pain,  palpitations and leg swelling.  Gastrointestinal: Negative for abdominal pain, constipation, diarrhea, nausea and vomiting.  Endocrine: Negative.   Genitourinary: Negative.  Negative for difficulty urinating and dysuria.  Musculoskeletal: Negative.  Negative for back pain and myalgias.  Skin: Negative.  Negative for color change and rash.  Allergic/Immunologic: Negative.   Neurological: Negative.  Negative for dizziness, weakness, light-headedness and numbness.  Hematological: Negative for adenopathy. Does not bruise/bleed easily.  Psychiatric/Behavioral: Negative.     Objective:  BP (!) 160/62 (BP Location: Left Arm, Patient Position: Sitting, Cuff Size: Normal)   Pulse 61   Temp 98.9 F (37.2 C) (Oral)   Resp 16   Ht 5' 3.75" (1.619 m)   Wt 175 lb 12 oz (79.7 kg)   SpO2 96%   BMI 30.40 kg/m   BP Readings from Last 3 Encounters:  09/30/17 (!) 160/62  08/14/17 (!) 144/54  07/31/17 136/78    Wt Readings from Last 3 Encounters:  09/30/17 175 lb 12 oz (79.7 kg)  08/14/17 172 lb 12.8 oz (78.4 kg)  07/31/17 170 lb (77.1 kg)    Physical Exam  Constitutional: She is oriented to person, place, and time. No distress.  HENT:  Mouth/Throat: Oropharynx is clear and moist. No oropharyngeal exudate.  Eyes: Conjunctivae are normal. Right eye exhibits no discharge. Left eye exhibits no discharge. No scleral icterus.  Neck: Normal range of motion. Neck supple. No JVD present. No thyromegaly present.  Cardiovascular: Normal rate, regular rhythm, S1 normal and S2 normal. Exam reveals no gallop, no S3, no S4 and no friction rub.  Murmur heard.  Systolic murmur is present with a grade of 1/6.  No diastolic murmur is present. Harsh 1/6 SEM RUSB  Pulmonary/Chest: Effort normal and breath sounds normal. She has no wheezes. She has no rales.  Abdominal: Soft. Bowel sounds are normal. She exhibits no distension and no mass. There is no tenderness. There is no rebound and no guarding.    Musculoskeletal: Normal range of motion. She exhibits no edema, tenderness or deformity.  Neurological: She is alert and oriented to person, place, and time.  Skin: Skin is warm and dry. No rash noted. She is not diaphoretic. No erythema. No pallor.  Vitals reviewed.   Lab Results  Component Value Date   WBC 6.6 09/30/2017   HGB 9.9 (L) 09/30/2017   HCT 28.1 (L) 09/30/2017   PLT 212.0 09/30/2017   GLUCOSE 99 07/31/2017   CHOL 123 08/14/2017   TRIG 69 08/14/2017   HDL 64 08/14/2017   LDLCALC 45 08/14/2017   ALT 11 05/05/2017   AST 16 05/05/2017   NA 141 07/31/2017   K 4.7 07/31/2017   CL 106 07/31/2017   CREATININE 1.25 (H) 07/31/2017   BUN 28 (H) 07/31/2017   CO2 28 07/31/2017   TSH 0.84 05/05/2017   INR 1.66 12/02/2016   HGBA1C 5.2 03/05/2016    No results found.  Assessment & Plan:   Johnisha was seen today for anemia.  Diagnoses and all orders for this visit:  Primary insomnia -     Suvorexant (BELSOMRA) 10 MG TABS; Take 1 tablet at bedtime as needed by mouth.  Other acquired hemolytic anemias ([place])- Her H&H remains slightly low but it is stable.  I do not think this is causing her symptoms. -     CBC with Differential/Platelet; Future  Deficiency anemia- Her H&H is stable.  I will screen her for zinc deficiency and vitamin B12 deficiency.  She recently had normal iron and folate studies. -     Vitamin B12; Future -     CBC with Differential/Platelet; Future -     Zinc; Future  Essential hypertension- Will allow permissive hypertension at her age.  Left ventricular diastolic dysfunction, NYHA class 1- She has a normal volume status.  I don't think this is contributing to her symptoms.   I am having Jennette Banker start on Suvorexant. I am also having her maintain her CALCIUM 600 + MINERALS, vitamin C, cholecalciferol, carvedilol, pantoprazole, torsemide, Multiple Vitamins-Minerals (CENTRUM SILVER PO), IRON PO, meloxicam, DUREZOL, and ofloxacin.  Meds  ordered this encounter  Medications  . DUREZOL 0.05 % EMUL    Sig: INSTIL 1 DROP IN OPERATIVE EYES TWICE A DAY FOR 3 WEEKS FOLLOWING SURGERY    Refill:  1  . ofloxacin (OCUFLOX) 0.3 % ophthalmic solution    Sig: START 2 DAYS PRIOR TO SURGERY. 1 DROP IN OPERATIVE EYE TWICE DAILY AND 3 WEEKS FOLLOWING SURGERY    Refill:  0  . Suvorexant (BELSOMRA) 10 MG TABS    Sig: Take 1 tablet at bedtime as needed by mouth.    Dispense:  30 tablet    Refill:  5     Follow-up: Return in about 3 months (around 12/31/2017).  [name], MD

## 2017-09-30 NOTE — Patient Instructions (Signed)

## 2017-10-03 ENCOUNTER — Encounter: Payer: Self-pay | Admitting: Internal Medicine

## 2017-10-03 ENCOUNTER — Other Ambulatory Visit: Payer: Self-pay | Admitting: Internal Medicine

## 2017-10-03 DIAGNOSIS — E60 Dietary zinc deficiency: Secondary | ICD-10-CM | POA: Insufficient documentation

## 2017-10-03 LAB — ZINC: ZINC: 59 ug/dL — AB (ref 60–130)

## 2017-10-03 MED ORDER — ZINC GLUCONATE 50 MG PO TABS: 50.0000 mg | ORAL_TABLET | Freq: Every day | ORAL | 1 refills | Status: DC

## 2017-10-17 ENCOUNTER — Other Ambulatory Visit: Payer: Self-pay | Admitting: Internal Medicine

## 2017-10-30 ENCOUNTER — Encounter: Payer: Self-pay | Admitting: Internal Medicine

## 2017-11-05 ENCOUNTER — Other Ambulatory Visit: Payer: Medicare Other

## 2017-11-05 ENCOUNTER — Ambulatory Visit (INDEPENDENT_AMBULATORY_CARE_PROVIDER_SITE_OTHER): Payer: Medicare Other | Admitting: Internal Medicine

## 2017-11-05 ENCOUNTER — Ambulatory Visit: Payer: Medicare Other | Admitting: Internal Medicine

## 2017-11-05 ENCOUNTER — Encounter: Payer: Self-pay | Admitting: Internal Medicine

## 2017-11-05 VITALS — BP 138/58 | HR 76 | Temp 98.8°F | Resp 16 | Ht 63.75 in | Wt 174.0 lb

## 2017-11-05 DIAGNOSIS — M4716 Other spondylosis with myelopathy, lumbar region: Secondary | ICD-10-CM

## 2017-11-05 DIAGNOSIS — N390 Urinary tract infection, site not specified: Secondary | ICD-10-CM

## 2017-11-05 DIAGNOSIS — R300 Dysuria: Secondary | ICD-10-CM

## 2017-11-05 LAB — POC URINALSYSI DIPSTICK (AUTOMATED)
BILIRUBIN UA: NEGATIVE
Glucose, UA: POSITIVE
Ketones, UA: NEGATIVE
NITRITE UA: NEGATIVE
PROTEIN UA: NEGATIVE
RBC UA: NEGATIVE
Spec Grav, UA: 1.025 (ref 1.010–1.025)
Urobilinogen, UA: 0.2 E.U./dL
pH, UA: 6 (ref 5.0–8.0)

## 2017-11-05 MED ORDER — NITROFURANTOIN MONOHYD MACRO 100 MG PO CAPS: 100.0000 mg | ORAL_CAPSULE | Freq: Two times a day (BID) | ORAL | 0 refills | Status: AC

## 2017-11-05 NOTE — Patient Instructions (Signed)

## 2017-11-05 NOTE — Progress Notes (Signed)
Subjective:  Patient ID: Claudia Lara, female    DOB: [birth year]  Age: 83 y.o. MRN: [id number]  CC: Urinary Tract Infection and Back Pain   HPI [name] presents for concerns about worsening low back pain and urinary frequency.  She has a history of degenerative disc disease in the lumbar spine and for the last few months complains of worsening back pain with activity.  The pain radiates into both thighs.  She denies numbness, weakness, tingling in her lower extremities.  She says the pain resolves when she sits down or bends forward.  She also complains of a several week history of urinary frequency and urgency.  Outpatient Medications Prior to Visit  Medication Sig Dispense Refill  . Calcium Carbonate-Vit D-Min (CALCIUM 600 + MINERALS) 600-200 MG-UNIT TABS Take 1 tablet by mouth daily.     . carvedilol (COREG) 12.5 MG tablet TAKE 1 TABLET BY MOUTH 2 TIMES DAILY WITH A MEAL. 180 tablet 2  . cholecalciferol (VITAMIN D) 1000 units tablet Take 1,000 Units by mouth daily.    . IRON PO Take 65 mg by mouth.    . meloxicam (MOBIC) 7.5 MG tablet Take 7.5 mg by mouth daily.     . Multiple Vitamins-Minerals (CENTRUM SILVER PO) Take by mouth.    . pantoprazole (PROTONIX) 40 MG tablet Take 1 tablet (40 mg total) by mouth daily. 90 tablet 1  . Suvorexant (BELSOMRA) 10 MG TABS Take 1 tablet at bedtime as needed by mouth. 30 tablet 5  . torsemide (DEMADEX) 10 MG tablet TAKE 1 TABLET BY MOUTH DAILY 30 tablet 3  . vitamin C (ASCORBIC ACID) 500 MG tablet Take 500 mg by mouth daily.    [name] zinc gluconate 50 MG tablet Take 1 tablet (50 mg total) daily by mouth. 90 tablet 1  . DUREZOL 0.05 % EMUL INSTIL 1 DROP IN OPERATIVE EYES TWICE A DAY FOR 3 WEEKS FOLLOWING SURGERY  1  . ofloxacin (OCUFLOX) 0.3 % ophthalmic solution START 2 DAYS PRIOR TO SURGERY. 1 DROP IN OPERATIVE EYE TWICE DAILY AND 3 WEEKS FOLLOWING SURGERY  0   No facility-administered medications prior to visit.     ROS Review of Systems   Constitutional: Negative for chills, fatigue and fever.  HENT: Negative.   Eyes: Negative for visual disturbance.  Respiratory: Negative for cough, chest tightness, shortness of breath and wheezing.   Cardiovascular: Negative for chest pain, palpitations and leg swelling.  Gastrointestinal: Negative for abdominal pain, diarrhea, nausea and vomiting.  Endocrine: Negative.   Genitourinary: Positive for dysuria, frequency and urgency. Negative for decreased urine volume, difficulty urinating, flank pain, hematuria and pelvic pain.  Musculoskeletal: Positive for back pain. Negative for myalgias and neck pain.  Skin: Negative.   Neurological: Negative.  Negative for dizziness, weakness and numbness.  Hematological: Negative for adenopathy. Does not bruise/bleed easily.  Psychiatric/Behavioral: Negative.     Objective:  BP (!) 138/58 (BP Location: Left Arm, Patient Position: Sitting, Cuff Size: Large)   Pulse 76   Temp 98.8 F (37.1 C) (Oral)   Resp 16   Ht 5' 3.75" (1.619 m)   Wt 174 lb (78.9 kg)   SpO2 98%   BMI 30.10 kg/m   BP Readings from Last 3 Encounters:  11/05/17 (!) 138/58  09/30/17 (!) 160/62  08/14/17 (!) 144/54    Wt Readings from Last 3 Encounters:  11/05/17 174 lb (78.9 kg)  09/30/17 175 lb 12 oz (79.7 kg)  08/14/17 172 lb 12.8  oz (78.4 kg)    Physical Exam  Constitutional: She is oriented to person, place, and time. No distress.  HENT:  Mouth/Throat: Oropharynx is clear and moist. No oropharyngeal exudate.  Eyes: Conjunctivae are normal. Left eye exhibits no discharge. No scleral icterus.  Neck: Normal range of motion. Neck supple. No JVD present. No thyromegaly present.  Cardiovascular: Normal rate, regular rhythm and normal heart sounds.  Pulmonary/Chest: Effort normal and breath sounds normal. No respiratory distress. She has no wheezes. She has no rales.  Abdominal: Soft. Bowel sounds are normal. She exhibits no distension and no mass. There is no  guarding.  Musculoskeletal: Normal range of motion. She exhibits no edema, tenderness or deformity.  Lymphadenopathy:    She has no cervical adenopathy.  Neurological: She is alert and oriented to person, place, and time. She has normal reflexes. She displays no atrophy. No cranial nerve deficit or sensory deficit. She exhibits normal muscle tone. She displays a negative Romberg sign. Coordination and gait normal.  Neg SLR in BLE  Skin: Skin is warm and dry. No rash noted. She is not diaphoretic. No erythema. No pallor.  Vitals reviewed.   Lab Results  Component Value Date   WBC 6.6 09/30/2017   HGB 9.9 (L) 09/30/2017   HCT 28.1 (L) 09/30/2017   PLT 212.0 09/30/2017   GLUCOSE 99 07/31/2017   CHOL 123 08/14/2017   TRIG 69 08/14/2017   HDL 64 08/14/2017   LDLCALC 45 08/14/2017   ALT 11 05/05/2017   AST 16 05/05/2017   NA 141 07/31/2017   K 4.7 07/31/2017   CL 106 07/31/2017   CREATININE 1.25 (H) 07/31/2017   BUN 28 (H) 07/31/2017   CO2 28 07/31/2017   TSH 0.84 05/05/2017   INR 1.66 12/02/2016   HGBA1C 5.2 03/05/2016    No results found.  Assessment & Plan:   Arieona was seen today for urinary tract infection and back pain.  Diagnoses and all orders for this visit:  Dysuria- Her UA is positive for leukocytes.  Her urine culture is positive for gram-positive cocci. -     POCT Urinalysis Dipstick (Automated) -     Cancel: CULTURE, URINE COMPREHENSIVE; Future -     CULTURE, URINE COMPREHENSIVE; Future  UTI (urinary tract infection) with pyuria-I will treat empirically with nitrofurantoin. -     CULTURE, URINE COMPREHENSIVE; Future -     nitrofurantoin, macrocrystal-monohydrate, (MACROBID) 100 MG capsule; Take 1 capsule (100 mg total) by mouth 2 (two) times daily for 5 days.  Spondylosis, lumbar, with myelopathy- It sounds like she has developed spinal stenosis.  She is unwilling to undergo an MRI because of prosthetics in her right hip and her right knee.  I have ordered a  lumbar myelogram to confirm the diagnosis and based on those results we will talk about whether or not she is willing to do any surgical procedures or ESI's to treat her back pain. -     DG Myelogram Lumbar; Future   I have discontinued Jana Half P. [place]'s DUREZOL and ofloxacin. I am also having her start on nitrofurantoin (macrocrystal-monohydrate). Additionally, I am having her maintain her CALCIUM 600 + MINERALS, vitamin C, cholecalciferol, carvedilol, torsemide, Multiple Vitamins-Minerals (CENTRUM SILVER PO), IRON PO, meloxicam, Suvorexant, zinc gluconate, and pantoprazole.  Meds ordered this encounter  Medications  . nitrofurantoin, macrocrystal-monohydrate, (MACROBID) 100 MG capsule    Sig: Take 1 capsule (100 mg total) by mouth 2 (two) times daily for 5 days.    Dispense:  10 capsule    Refill:  0     Follow-up: No Follow-up on file.  [name], MD

## 2017-11-07 ENCOUNTER — Encounter: Payer: Self-pay | Admitting: Internal Medicine

## 2017-11-07 LAB — CULTURE, URINE COMPREHENSIVE
MICRO NUMBER: 81396878
SPECIMEN QUALITY:: ADEQUATE

## 2017-11-12 ENCOUNTER — Encounter: Payer: Self-pay | Admitting: Internal Medicine

## 2017-11-13 ENCOUNTER — Other Ambulatory Visit: Payer: Self-pay | Admitting: Internal Medicine

## 2017-11-13 DIAGNOSIS — M4716 Other spondylosis with myelopathy, lumbar region: Secondary | ICD-10-CM

## 2017-12-01 ENCOUNTER — Ambulatory Visit
Admission: RE | Admit: 2017-12-01 | Discharge: 2017-12-01 | Disposition: A | Discharge status: Home / Self Care | Payer: Medicare Other | Source: Ambulatory Visit | Attending: Internal Medicine | Admitting: Internal Medicine

## 2017-12-01 ENCOUNTER — Encounter: Payer: Self-pay | Admitting: Internal Medicine

## 2017-12-01 DIAGNOSIS — M4716 Other spondylosis with myelopathy, lumbar region: Secondary | ICD-10-CM

## 2017-12-01 DIAGNOSIS — M48061 Spinal stenosis, lumbar region without neurogenic claudication: Secondary | ICD-10-CM | POA: Diagnosis not present

## 2017-12-01 MED ORDER — DIAZEPAM 5 MG PO TABS
5.0000 mg | ORAL_TABLET | Freq: Once | ORAL | Status: AC
  Administered 2017-12-01: 5 mg via ORAL

## 2017-12-01 MED ORDER — IOPAMIDOL (ISOVUE-M 200) INJECTION 41%
15.0000 mL | Freq: Once | INTRAMUSCULAR | Status: AC
  Administered 2017-12-01: 15 mL via INTRATHECAL

## 2017-12-01 NOTE — Discharge Instructions (Signed)

## 2017-12-02 ENCOUNTER — Other Ambulatory Visit: Payer: Self-pay | Admitting: Internal Medicine

## 2017-12-02 DIAGNOSIS — M4716 Other spondylosis with myelopathy, lumbar region: Secondary | ICD-10-CM

## 2017-12-15 ENCOUNTER — Encounter (INDEPENDENT_AMBULATORY_CARE_PROVIDER_SITE_OTHER): Payer: Self-pay | Admitting: Orthopaedic Surgery

## 2017-12-15 ENCOUNTER — Ambulatory Visit (INDEPENDENT_AMBULATORY_CARE_PROVIDER_SITE_OTHER): Payer: Medicare Other | Admitting: Orthopaedic Surgery

## 2017-12-15 VITALS — BP 149/44 | HR 63 | Resp 16 | Ht 63.75 in | Wt 175.0 lb

## 2017-12-15 DIAGNOSIS — Z96641 Presence of right artificial hip joint: Secondary | ICD-10-CM | POA: Diagnosis not present

## 2017-12-15 NOTE — Progress Notes (Signed)
Office Visit Note   Patient: KIARRAH RAUSCH           Date of Birth: [birth year]           MRN: [id number] Visit Date: 12/15/2017              Requested by: Janith Lima, MD [address], [place] [address] PCP: Janith Lima, MD   Assessment & Plan: Visit Diagnoses:  1. Status post right hip replacement     Plan: One year postop and very happy with the results. No pain or use of ambulatory aids. Urge her to continue with her exercises and see her back as needed  Follow-Up Instructions: Return if symptoms worsen or fail to improve.   Orders:  No orders of the defined types were placed in this encounter.  No orders of the defined types were placed in this encounter.     Procedures: No procedures performed   Clinical Data: No additional findings.   Subjective: Chief Complaint  Patient presents with  . Right Hip - Follow-up  . Post-op Follow-up    Right hip replacement 11/2016, doing good  No related problems. No history of fever or chills.  HPI  Review of Systems  Constitutional: Positive for fatigue.  HENT: Negative for trouble swallowing.   Eyes: Positive for pain.  Respiratory: Positive for shortness of breath.   Cardiovascular: Positive for leg swelling.  Gastrointestinal: Negative for constipation.  Endocrine: Negative for cold intolerance.  Genitourinary: Negative for difficulty urinating.  Musculoskeletal: Positive for back pain, joint swelling and neck pain.  Skin: Negative for rash.  Allergic/Immunologic: Negative for food allergies.  Neurological: Negative for numbness.  Hematological: Bruises/bleeds easily.  Psychiatric/Behavioral: Negative for sleep disturbance.     Objective: Vital Signs: BP (!) 149/44 (BP Location: Right Arm, Patient Position: Sitting, Cuff Size: Normal)   Pulse 63   Resp 16   Ht 5' 3.75" (1.619 m)   Wt 175 lb (79.4 kg)   BMI 30.27 kg/m   Physical Exam  Ortho Exam awake alert and oriented  3. Comfortable sitting. No pain with ambulation. No limp. Painless range of motion right hip. Incision is healed without any problems. Little atrophy around the incision but otherwise intact. Her vascular exam intact  Specialty Comments:  No specialty comments available.  Imaging: No results found.   PMFS History: Patient Active Problem List   Diagnosis Date Noted  . Zinc deficiency 10/03/2017  . Primary insomnia 09/30/2017  . Spondylosis, lumbar, with myelopathy 04/09/2017  . Carpal tunnel syndrome, right upper limb 04/09/2017  . GERD (gastroesophageal reflux disease) 08/03/2015  . Hemolytic anemia ([place]) 07/12/2015  . Deficiency anemia 06/21/2015  . Chronic venous insufficiency 08/10/2014  . Aortic stenosis, mild 06/29/2014  . Left ventricular diastolic dysfunction, NYHA class 1 06/29/2014  . Senile osteopenia 04/21/2014  . Hyperlipidemia with target LDL less than 130 04/21/2014  . Kidney disease, chronic, stage III (GFR 30-59 ml/min) ([place]) 04/21/2014  . Routine health maintenance 07/16/2012  . Carotid artery stenosis without cerebral infarction 01/16/2010  . Essential hypertension 10/19/2007   Past Medical History:  Diagnosis Date  . ANEMIA 08/22/2009  . CAROTID ARTERY STENOSIS 01/16/2010  . Complication of anesthesia    very hard to wake up from surgery  . DYSPNEA ON EXERTION 11/16/2007  . Gallstones   . GEN OSTEOARTHROSIS INVOLVING MULTIPLE SITES 11/11/2008  . GERD (gastroesophageal reflux disease)   . HEMORRHOIDS, INTERNAL    surgery was  in the 90's (per patient)  . NECK PAIN, ACUTE 12/28/2009  . PERIPHERAL EDEMA 10/06/2007  . PVD 10/06/2007  . Unspecified essential hypertension 10/19/2007    Family History  Problem Relation Age of Onset  . Ovarian cancer Mother   . Cancer Mother   . Leukemia Father   . Lung cancer Father   . Cancer Father   . Cancer Brother   . Diabetes Neg Hx   . Heart disease Neg Hx   . Hypertension Neg Hx     Past Surgical History:    Procedure Laterality Date  . ABDOMINAL HYSTERECTOMY    . BUNIONECTOMY     bilat  . CARPAL TUNNEL RELEASE    . CHOLECYSTECTOMY  01/29/2016   [facility]  . CHOLECYSTECTOMY    . ENDOSCOPIC RETROGRADE CHOLANGIOPANCREATOGRAPHY (ERCP) WITH PROPOFOL N/A 12/01/2015   Procedure: ENDOSCOPIC RETROGRADE CHOLANGIOPANCREATOGRAPHY (ERCP) WITH PROPOFOL;  Surgeon: [name], MD;  Location: [facility];  Service: Endoscopy;  Laterality: N/A;  . ENDOSCOPIC RETROGRADE CHOLANGIOPANCREATOGRAPHY (ERCP) WITH PROPOFOL N/A 02/01/2016   Procedure: ENDOSCOPIC RETROGRADE CHOLANGIOPANCREATOGRAPHY (ERCP) WITH PROPOFOL;  Surgeon: [name], MD;  Location: [facility];  Service: Endoscopy;  Laterality: N/A;  . HAMMER TOE SURGERY    . HEMORRHOID SURGERY    . Hyperplastic colon polyps, removed  2007   By Dr. Penelope Coop  . JOINT REPLACEMENT  2011   right knee  . OOPHORECTOMY    . right hip replacement    . ROTATOR CUFF REPAIR  2004   left (Dr. Durward Fortes)  . SHOULDER ARTHROSCOPY    . TOTAL HIP ARTHROPLASTY Right 11/26/2016   Procedure: RIGHT TOTAL HIP ARTHROPLASTY;  Surgeon: Garald Balding, MD;  Location: [facility];  Service: Orthopedics;  Laterality: Right;  . TOTAL KNEE ARTHROPLASTY Right    Social History   Occupational History  . Occupation: Producer, television/film/video: RETIRED    Comment: retired  Tobacco Use  . Smoking status: Never Smoker  . Smokeless tobacco: Never Used  Substance and Sexual Activity  . Alcohol use: No  . Drug use: No  . Sexual activity: Not Currently

## 2017-12-18 DIAGNOSIS — I10 Essential (primary) hypertension: Secondary | ICD-10-CM | POA: Diagnosis not present

## 2017-12-18 DIAGNOSIS — M5136 Other intervertebral disc degeneration, lumbar region: Secondary | ICD-10-CM | POA: Diagnosis not present

## 2018-01-01 DIAGNOSIS — H5711 Ocular pain, right eye: Secondary | ICD-10-CM | POA: Diagnosis not present

## 2018-01-01 DIAGNOSIS — Z961 Presence of intraocular lens: Secondary | ICD-10-CM | POA: Diagnosis not present

## 2018-01-01 DIAGNOSIS — M48062 Spinal stenosis, lumbar region with neurogenic claudication: Secondary | ICD-10-CM | POA: Diagnosis not present

## 2018-01-01 DIAGNOSIS — I10 Essential (primary) hypertension: Secondary | ICD-10-CM | POA: Diagnosis not present

## 2018-01-01 DIAGNOSIS — H04123 Dry eye syndrome of bilateral lacrimal glands: Secondary | ICD-10-CM | POA: Diagnosis not present

## 2018-01-06 ENCOUNTER — Telehealth: Payer: Self-pay

## 2018-01-06 MED ORDER — CARVEDILOL 12.5 MG PO TABS: 12.5000 mg | ORAL_TABLET | Freq: Two times a day (BID) | ORAL | 1 refills | Status: DC

## 2018-01-06 NOTE — Telephone Encounter (Signed)
[facility] sent rx request for carvedilol 12.5 mg tablet.  LOV 10/2017. erx sent as requested.

## 2018-01-13 DIAGNOSIS — M48062 Spinal stenosis, lumbar region with neurogenic claudication: Secondary | ICD-10-CM | POA: Diagnosis not present

## 2018-01-13 DIAGNOSIS — I10 Essential (primary) hypertension: Secondary | ICD-10-CM | POA: Diagnosis not present

## 2018-01-14 ENCOUNTER — Other Ambulatory Visit: Payer: Self-pay | Admitting: Internal Medicine

## 2018-01-14 DIAGNOSIS — M159 Polyosteoarthritis, unspecified: Secondary | ICD-10-CM

## 2018-01-23 DIAGNOSIS — I5189 Other ill-defined heart diseases: Secondary | ICD-10-CM

## 2018-01-23 HISTORY — DX: Other ill-defined heart diseases: I51.89

## 2018-02-04 ENCOUNTER — Telehealth: Payer: Self-pay | Admitting: Internal Medicine

## 2018-02-04 DIAGNOSIS — D539 Nutritional anemia, unspecified: Secondary | ICD-10-CM

## 2018-02-04 DIAGNOSIS — D598 Other acquired hemolytic anemias: Secondary | ICD-10-CM

## 2018-02-04 NOTE — Telephone Encounter (Signed)
New Message   Pt c/o Shortness Of Breath: STAT if SOB developed within the last 24 hours or pt is noticeably SOB on the phone  1. Are you currently SOB (can you hear that pt is SOB on the phone)? No   2. How long have you been experiencing SOB? On and off for several months   3. Are you SOB when sitting or when up moving around? Moving around   4. Are you currently experiencing any other symptoms? No  Patient states that the neurosurgeon told her that it seems that her heart is not getting enough blood to it and suggested that she sees her cardiologist. Please call to discuss.

## 2018-02-04 NOTE — Telephone Encounter (Signed)
Last seen Sept 2018. Last echo Dec 2017. Advised patient I will contact her with sooner appointment if one becomes available. She is complaining of SOB, same SOB she has had for years and no one can explain why.  The last time she saw Dr. Harrington Challenger (Sept 2018) she had to stop and rest before got to the building. This continues.  Pt feels it is getting worse. Has anemia and has had blood transfusions in past. No recent blood work. Saw Dr. Trenton Gammon for her back (arthritis) and he suggested she get back to cardiology for follow up.

## 2018-02-04 NOTE — Telephone Encounter (Signed)
Patient should have CBC an BMET and TSH first   Hx of anemia

## 2018-02-05 NOTE — Telephone Encounter (Signed)
Informed patient. Lab orders placed, appointment made for Tue 3/19.

## 2018-02-10 ENCOUNTER — Other Ambulatory Visit: Payer: Medicare Other | Admitting: *Deleted

## 2018-02-10 DIAGNOSIS — D539 Nutritional anemia, unspecified: Secondary | ICD-10-CM | POA: Diagnosis not present

## 2018-02-10 DIAGNOSIS — D598 Other acquired hemolytic anemias: Secondary | ICD-10-CM | POA: Diagnosis not present

## 2018-02-10 LAB — BASIC METABOLIC PANEL
BUN / CREAT RATIO: 19 (ref 12–28)
BUN: 26 mg/dL (ref 8–27)
CO2: 21 mmol/L (ref 20–29)
CREATININE: 1.39 mg/dL — AB (ref 0.57–1.00)
Calcium: 9.1 mg/dL (ref 8.7–10.3)
Chloride: 108 mmol/L — ABNORMAL HIGH (ref 96–106)
GFR, EST AFRICAN AMERICAN: 40 mL/min/{1.73_m2} — AB (ref >59)
GFR, EST NON AFRICAN AMERICAN: 35 mL/min/{1.73_m2} — AB (ref >59)
Glucose: 112 mg/dL — ABNORMAL HIGH (ref 65–99)
Potassium: 4.5 mmol/L (ref 3.5–5.2)
SODIUM: 143 mmol/L (ref 134–144)

## 2018-02-10 LAB — CBC
HEMATOCRIT: 27.4 % — AB (ref 34.0–46.6)
HEMOGLOBIN: 9.5 g/dL — AB (ref 11.1–15.9)
MCH: 31.1 pg (ref 26.6–33.0)
MCHC: 34.7 g/dL (ref 31.5–35.7)
MCV: 90 fL (ref 79–97)
Platelets: 251 10*3/uL (ref 150–379)
RBC: 3.05 x10E6/uL — ABNORMAL LOW (ref 3.77–5.28)
RDW: 18.4 % — ABNORMAL HIGH (ref 12.3–15.4)
WBC: 5.7 10*3/uL (ref 3.4–10.8)

## 2018-02-10 LAB — TSH: TSH: 1.24 u[IU]/mL (ref 0.450–4.500)

## 2018-02-12 ENCOUNTER — Telehealth: Payer: Self-pay | Admitting: Internal Medicine

## 2018-02-12 DIAGNOSIS — I350 Nonrheumatic aortic (valve) stenosis: Secondary | ICD-10-CM

## 2018-02-12 NOTE — Telephone Encounter (Signed)
Notes recorded by Fay Records, MD on 02/10/2018 at 11:37 PM EDT Thyroid funciton is normal  Pt remains mildly anemic  This is stable  Kidney function is relatively stable  Make sure she is drinking adequate fluids  Set up for echo to reevaluate aortic valve  Left message on voice mail (DPR and verbal request) of results and to be sure to get adequate fluids every day.  Scheduled echo fro 02/18/18 at 3pm, advised to arrive at 2:30.  Asked that patient call back to confirm this date/time works for her.

## 2018-02-12 NOTE — Telephone Encounter (Signed)
New Message   Patient is returning call in reference to lab results. Please call to discuss.  

## 2018-02-18 ENCOUNTER — Other Ambulatory Visit: Payer: Self-pay

## 2018-02-18 ENCOUNTER — Ambulatory Visit (HOSPITAL_COMMUNITY): Payer: Medicare Other | Attending: Cardiology

## 2018-02-18 DIAGNOSIS — I350 Nonrheumatic aortic (valve) stenosis: Secondary | ICD-10-CM | POA: Diagnosis not present

## 2018-02-18 DIAGNOSIS — I083 Combined rheumatic disorders of mitral, aortic and tricuspid valves: Secondary | ICD-10-CM | POA: Insufficient documentation

## 2018-03-16 ENCOUNTER — Encounter: Payer: Self-pay | Admitting: Internal Medicine

## 2018-03-16 ENCOUNTER — Ambulatory Visit (INDEPENDENT_AMBULATORY_CARE_PROVIDER_SITE_OTHER): Payer: Medicare Other | Admitting: Internal Medicine

## 2018-03-16 ENCOUNTER — Other Ambulatory Visit (INDEPENDENT_AMBULATORY_CARE_PROVIDER_SITE_OTHER): Payer: Medicare Other

## 2018-03-16 VITALS — BP 144/62 | HR 90 | Temp 98.1°F | Resp 16 | Wt 174.0 lb

## 2018-03-16 DIAGNOSIS — R17 Unspecified jaundice: Secondary | ICD-10-CM | POA: Diagnosis not present

## 2018-03-16 DIAGNOSIS — R300 Dysuria: Secondary | ICD-10-CM

## 2018-03-16 DIAGNOSIS — N183 Chronic kidney disease, stage 3 unspecified: Secondary | ICD-10-CM

## 2018-03-16 DIAGNOSIS — E60 Dietary zinc deficiency: Secondary | ICD-10-CM | POA: Diagnosis not present

## 2018-03-16 DIAGNOSIS — D599 Acquired hemolytic anemia, unspecified: Secondary | ICD-10-CM | POA: Diagnosis not present

## 2018-03-16 DIAGNOSIS — R945 Abnormal results of liver function studies: Secondary | ICD-10-CM

## 2018-03-16 DIAGNOSIS — N3000 Acute cystitis without hematuria: Secondary | ICD-10-CM | POA: Diagnosis not present

## 2018-03-16 DIAGNOSIS — R7989 Other specified abnormal findings of blood chemistry: Secondary | ICD-10-CM

## 2018-03-16 LAB — POCT URINALYSIS DIPSTICK
Blood, UA: NEGATIVE
GLUCOSE UA: NEGATIVE
Ketones, UA: NEGATIVE
Nitrite, UA: POSITIVE
PH UA: 6 (ref 5.0–8.0)
Spec Grav, UA: 1.02 (ref 1.010–1.025)
Urobilinogen, UA: 2 E.U./dL — AB

## 2018-03-16 LAB — HEPATIC FUNCTION PANEL
ALK PHOS: 138 U/L — AB (ref 39–117)
ALT: 143 U/L — AB (ref 0–35)
AST: 99 U/L — ABNORMAL HIGH (ref 0–37)
Albumin: 4.1 g/dL (ref 3.5–5.2)
BILIRUBIN DIRECT: 7.5 mg/dL — AB (ref 0.0–0.3)
BILIRUBIN TOTAL: 12.8 mg/dL — AB (ref 0.2–1.2)
Total Protein: 6 g/dL (ref 6.0–8.3)

## 2018-03-16 LAB — CBC WITH DIFFERENTIAL/PLATELET
BASOS PCT: 0.4 % (ref 0.0–3.0)
Basophils Absolute: 0 10*3/uL (ref 0.0–0.1)
EOS PCT: 0.8 % (ref 0.0–5.0)
Eosinophils Absolute: 0.1 10*3/uL (ref 0.0–0.7)
HCT: 25.6 % — ABNORMAL LOW (ref 36.0–46.0)
Hemoglobin: 9.1 g/dL — ABNORMAL LOW (ref 12.0–15.0)
LYMPHS ABS: 0.9 10*3/uL (ref 0.7–4.0)
Lymphocytes Relative: 11.6 % — ABNORMAL LOW (ref 12.0–46.0)
MCHC: 35.5 g/dL (ref 30.0–36.0)
MCV: 90.8 fl (ref 78.0–100.0)
MONO ABS: 0.6 10*3/uL (ref 0.1–1.0)
Monocytes Relative: 8.1 % (ref 3.0–12.0)
NEUTROS ABS: 6.2 10*3/uL (ref 1.4–7.7)
NEUTROS PCT: 79.1 % — AB (ref 43.0–77.0)
PLATELETS: 232 10*3/uL (ref 150.0–400.0)
RBC: 2.82 Mil/uL — ABNORMAL LOW (ref 3.87–5.11)
RDW: 19.4 % — AB (ref 11.5–15.5)
WBC: 7.8 10*3/uL (ref 4.0–10.5)

## 2018-03-16 LAB — BASIC METABOLIC PANEL
BUN: 23 mg/dL (ref 6–23)
CHLORIDE: 105 meq/L (ref 96–112)
CO2: 24 mEq/L (ref 19–32)
CREATININE: 1.3 mg/dL — AB (ref 0.40–1.20)
Calcium: 9 mg/dL (ref 8.4–10.5)
GFR: 41.48 mL/min — ABNORMAL LOW (ref >60.00)
GLUCOSE: 115 mg/dL — AB (ref 70–99)
POTASSIUM: 4.6 meq/L (ref 3.5–5.1)
Sodium: 137 mEq/L (ref 135–145)

## 2018-03-16 MED ORDER — NITROFURANTOIN MONOHYD MACRO 100 MG PO CAPS: 100.0000 mg | ORAL_CAPSULE | Freq: Two times a day (BID) | ORAL | 0 refills | Status: AC

## 2018-03-16 NOTE — Progress Notes (Signed)
Subjective:  Patient ID: Claudia Lara, female    DOB: 1932-07-09  Age: 84 y.o. MRN: [id number]  CC: Anemia and Urinary Tract Infection   HPI LEORA PLATT presents for f/up - She complains of a one-week history of dysuria, urgency, and frequency.  She also noticed some time over the last few days that she has started turning yellow.  She has chronic fatigue and shortness of breath and says that that has worsened to some degree.  She denies abdominal pain, nausea, vomiting, loss of appetite, diarrhea, or constipation.  Outpatient Medications Prior to Visit  Medication Sig Dispense Refill  . Calcium Carbonate-Vit D-Min (CALCIUM 600 + MINERALS) 600-200 MG-UNIT TABS Take 1 tablet by mouth daily.     . carvedilol (COREG) 12.5 MG tablet Take 1 tablet (12.5 mg total) by mouth 2 (two) times daily with a meal. 180 tablet 1  . cholecalciferol (VITAMIN D) 1000 units tablet Take 1,000 Units by mouth daily.    . Glucosamine-Chondroit-Vit C-Mn (GLUCOSAMINE 1500 COMPLEX PO) Take by mouth.    . IRON PO Take 65 mg by mouth.    . meloxicam (MOBIC) 7.5 MG tablet Take 7.5 mg by mouth daily.     . Multiple Vitamins-Minerals (CENTRUM SILVER PO) Take by mouth.    . pantoprazole (PROTONIX) 40 MG tablet Take 1 tablet (40 mg total) by mouth daily. 90 tablet 1  . Suvorexant (BELSOMRA) 10 MG TABS Take 1 tablet at bedtime as needed by mouth. 30 tablet 5  . torsemide (DEMADEX) 10 MG tablet TAKE 1 TABLET BY MOUTH DAILY 30 tablet 3  . vitamin C (ASCORBIC ACID) 500 MG tablet Take 500 mg by mouth daily.    [name] zinc gluconate 50 MG tablet Take 1 tablet (50 mg total) daily by mouth. 90 tablet 1  . meloxicam (MOBIC) 7.5 MG tablet TAKE 1 TABLET BY MOUTH DAILY 90 tablet 1   No facility-administered medications prior to visit.     ROS Review of Systems  Constitutional: Positive for fatigue. Negative for activity change, appetite change, chills, diaphoresis, fever and unexpected weight change.  HENT: Negative.   Eyes:  Negative for visual disturbance.  Respiratory: Positive for shortness of breath. Negative for cough and chest tightness.   Cardiovascular: Negative for chest pain, palpitations and leg swelling.  Gastrointestinal: Negative for abdominal pain, constipation, diarrhea, nausea and vomiting.  Endocrine: Negative.   Genitourinary: Positive for dysuria, frequency and urgency. Negative for decreased urine volume, difficulty urinating, flank pain, hematuria and pelvic pain.  Musculoskeletal: Negative.  Negative for arthralgias and myalgias.  Skin: Positive for color change. Negative for pallor and rash.  Allergic/Immunologic: Negative.   Neurological: Negative.  Negative for dizziness, weakness and light-headedness.  Hematological: Negative for adenopathy. Does not bruise/bleed easily.  Psychiatric/Behavioral: Negative.     Objective:  BP (!) 144/62   Pulse 90   Temp 98.1 F (36.7 C) (Oral)   Resp 16   Wt 174 lb (78.9 kg)   SpO2 95%   BMI 30.10 kg/m   BP Readings from Last 3 Encounters:  03/16/18 (!) 144/62  12/15/17 (!) 149/44  12/01/17 (!) 157/64    Wt Readings from Last 3 Encounters:  03/16/18 174 lb (78.9 kg)  12/15/17 175 lb (79.4 kg)  11/05/17 174 lb (78.9 kg)    Physical Exam  Constitutional: She is oriented to person, place, and time. No distress.  HENT:  Mouth/Throat: Oropharynx is clear and moist. No oropharyngeal exudate.  Eyes: Conjunctivae are normal.  Scleral icterus is present.  Neck: Normal range of motion. Neck supple. No thyromegaly present.  Cardiovascular: Normal rate, regular rhythm and normal heart sounds. Exam reveals no gallop and no friction rub.  No murmur heard. Pulmonary/Chest: Effort normal and breath sounds normal. No stridor. No respiratory distress. She has no wheezes. She has no rales.  Abdominal: Soft. Bowel sounds are normal. She exhibits no distension and no mass. There is no hepatosplenomegaly. There is no tenderness.  Musculoskeletal: Normal  range of motion. She exhibits no edema, tenderness or deformity.  Lymphadenopathy:    She has no cervical adenopathy.  Neurological: She is alert and oriented to person, place, and time.  Skin: Skin is warm and dry. No rash noted. She is not diaphoretic. No pallor.  Vitals reviewed.   Lab Results  Component Value Date   WBC 7.8 03/16/2018   HGB 9.1 (L) 03/16/2018   HCT 25.6 (L) 03/16/2018   PLT 232.0 03/16/2018   GLUCOSE 115 (H) 03/16/2018   CHOL 123 08/14/2017   TRIG 69 08/14/2017   HDL 64 08/14/2017   LDLCALC 45 08/14/2017   ALT 143 (H) 03/16/2018   AST 99 (H) 03/16/2018   NA 137 03/16/2018   K 4.6 03/16/2018   CL 105 03/16/2018   CREATININE 1.30 (H) 03/16/2018   BUN 23 03/16/2018   CO2 24 03/16/2018   TSH 1.240 02/10/2018   INR 1.66 12/02/2016   HGBA1C 5.2 03/05/2016    Ct Lumbar Spine W Contrast  Result Date: 12/01/2017 CLINICAL DATA:  Diffuse low back pain. Bilateral lower extremity fatigue. Severe weakness with walking. EXAM: LUMBAR MYELOGRAM FLUOROSCOPY TIME:  Radiation Exposure Index (as provided by the fluoroscopic device): 325.38 uGy*m2 Fluoroscopy Time:  1 minute Number of Acquired Images:  15 PROCEDURE: After thorough discussion of risks and benefits of the procedure including bleeding, infection, injury to nerves, blood vessels, adjacent structures as well as headache and CSF leak, written and oral informed consent was obtained. Consent was obtained by Dr. San Morelle. Time out form was completed. Patient was positioned prone on the fluoroscopy table. Local anesthesia was provided with 1% lidocaine without epinephrine after prepped and draped in the usual sterile fashion. Puncture was performed at L3-4 using a 3 1/2 inch 22-gauge spinal needle via a left paramedian approach. Using a single pass through the dura, the needle was placed within the thecal sac, with return of clear CSF. 15 mL of Isovue M-200 was injected into the thecal sac, with normal opacification  of the nerve roots and cauda equina consistent with free flow within the subarachnoid space. I personally performed the lumbar puncture and administered the intrathecal contrast. I also personally supervised acquisition of the myelogram images. TECHNIQUE: Contiguous axial images were obtained through the Lumbar spine after the intrathecal infusion of infusion. Coronal and sagittal reconstructions were obtained of the axial image sets. COMPARISON:  MRI of the lumbar spine 10/18/2015 FINDINGS: LUMBAR MYELOGRAM FINDINGS: The 5 non rib-bearing lumbar type 2 bodies are present. Multilevel spondylosis is again noted. New O disc protrusions are present at all levels of the lumbar spine. There is slight anterolisthesis at L4-5. AP alignment is otherwise anatomic. Moderate central canal stenosis is present with subarticular narrowing bilaterally at L2-3. There is moderate right and mild left subarticular narrowing at L3-4. Moderate foraminal narrowing is present bilaterally at L4-5. There is early truncation of the left S1 nerve root. The right S1 nerve root fills normally. Anterolisthesis at L4-5 does not change significantly with standing, flexion,  or extension. There is very little range of motion with flexion or extension. Disc protrusion at L3-4 is exacerbated with standing. This does not change significantly with flexion or extension. Atherosclerotic calcifications are present in the aorta. CT LUMBAR MYELOGRAM FINDINGS: The lumbar spine is imaged from the midbody of T12 through S2-3. Slight retrolisthesis is present at L2-3. Slight anterolisthesis is present at L4-5. There is no significant interval change. Atherosclerotic calcifications are present in the aorta without aneurysm. Cholecystectomy is noted. No other discrete solid organ lesions are present. There is no significant adenopathy. Multiple subcentimeter para-aortic nodes are present. L1-2: A broad-based disc protrusion is present. Moderate facet hypertrophy  is noted bilaterally. Mild subarticular narrowing is worse on the left. Mild foraminal narrowing is worse on the left. There is no significant change. L2-3: A broad-based disc protrusion is present. Moderate facet hypertrophy and ligamentum flavum thickening is noted. Moderate subarticular narrowing is worse on the right. This has progressed some. Moderate right and mild left foraminal narrowing is present. L3-4: A broad-based disc protrusion is present. Advanced facet hypertrophy has progressed. This results in moderate right and mild left subarticular narrowing with progression. Moderate right and mild left foraminal narrowing has progressed some as well. L4-5: A broad-based disc protrusion is present. Advanced facet hypertrophy is noted. Severe right and moderate left foraminal stenosis is present. Progressive moderate foraminal narrowing is again seen bilaterally, right greater than left. L5-S1: A prominent central disc protrusion is present. The Moderate facet hypertrophy and spurring has progressed. Moderate subarticular narrowing is worse on the left. Moderate foraminal narrowing is also worse on the left without definite change. IMPRESSION: 1. Progressive multilevel spondylosis of the lumbar spine. 2. The L3-4 disc protrusion is worse with standing. 3. Other disc disease does not significantly change with standing. There is no abnormal motion with standing. 4.  Aortic Atherosclerosis (ICD10-I70.0). 5. Mild subarticular and foraminal narrowing at L1-2 is stable, worse on the left. 6. Moderate subarticular stenosis at L2-3 has progressed. There is also progressive moderate right and mild left foraminal narrowing at the same level. 7. Moderate right and mild left subarticular and foraminal stenosis at L3-4 has progressed. 8. The severe right and moderate left foraminal stenosis and moderate bilateral foraminal narrowing at L4-5 has progressed. 9. Progressive facet disease L5 to S for leading to moderate  subarticular and foraminal stenosis, left greater than right. Subarticular stenosis has progressed. Foraminal narrowing is stable. Electronically Signed   By: San Morelle M.D.   On: 12/01/2017 13:23   Dg Myelography Lumbar Inj Lumbosacral  Result Date: 12/01/2017 CLINICAL DATA:  Diffuse low back pain. Bilateral lower extremity fatigue. Severe weakness with walking. EXAM: LUMBAR MYELOGRAM FLUOROSCOPY TIME:  Radiation Exposure Index (as provided by the fluoroscopic device): 325.38 uGy*m2 Fluoroscopy Time:  1 minute Number of Acquired Images:  15 PROCEDURE: After thorough discussion of risks and benefits of the procedure including bleeding, infection, injury to nerves, blood vessels, adjacent structures as well as headache and CSF leak, written and oral informed consent was obtained. Consent was obtained by Dr. San Morelle. Time out form was completed. Patient was positioned prone on the fluoroscopy table. Local anesthesia was provided with 1% lidocaine without epinephrine after prepped and draped in the usual sterile fashion. Puncture was performed at L3-4 using a 3 1/2 inch 22-gauge spinal needle via a left paramedian approach. Using a single pass through the dura, the needle was placed within the thecal sac, with return of clear CSF. 15 mL of Isovue M-200  was injected into the thecal sac, with normal opacification of the nerve roots and cauda equina consistent with free flow within the subarachnoid space. I personally performed the lumbar puncture and administered the intrathecal contrast. I also personally supervised acquisition of the myelogram images. TECHNIQUE: Contiguous axial images were obtained through the Lumbar spine after the intrathecal infusion of infusion. Coronal and sagittal reconstructions were obtained of the axial image sets. COMPARISON:  MRI of the lumbar spine 10/18/2015 FINDINGS: LUMBAR MYELOGRAM FINDINGS: The 5 non rib-bearing lumbar type 2 bodies are present. Multilevel  spondylosis is again noted. New O disc protrusions are present at all levels of the lumbar spine. There is slight anterolisthesis at L4-5. AP alignment is otherwise anatomic. Moderate central canal stenosis is present with subarticular narrowing bilaterally at L2-3. There is moderate right and mild left subarticular narrowing at L3-4. Moderate foraminal narrowing is present bilaterally at L4-5. There is early truncation of the left S1 nerve root. The right S1 nerve root fills normally. Anterolisthesis at L4-5 does not change significantly with standing, flexion, or extension. There is very little range of motion with flexion or extension. Disc protrusion at L3-4 is exacerbated with standing. This does not change significantly with flexion or extension. Atherosclerotic calcifications are present in the aorta. CT LUMBAR MYELOGRAM FINDINGS: The lumbar spine is imaged from the midbody of T12 through S2-3. Slight retrolisthesis is present at L2-3. Slight anterolisthesis is present at L4-5. There is no significant interval change. Atherosclerotic calcifications are present in the aorta without aneurysm. Cholecystectomy is noted. No other discrete solid organ lesions are present. There is no significant adenopathy. Multiple subcentimeter para-aortic nodes are present. L1-2: A broad-based disc protrusion is present. Moderate facet hypertrophy is noted bilaterally. Mild subarticular narrowing is worse on the left. Mild foraminal narrowing is worse on the left. There is no significant change. L2-3: A broad-based disc protrusion is present. Moderate facet hypertrophy and ligamentum flavum thickening is noted. Moderate subarticular narrowing is worse on the right. This has progressed some. Moderate right and mild left foraminal narrowing is present. L3-4: A broad-based disc protrusion is present. Advanced facet hypertrophy has progressed. This results in moderate right and mild left subarticular narrowing with progression.  Moderate right and mild left foraminal narrowing has progressed some as well. L4-5: A broad-based disc protrusion is present. Advanced facet hypertrophy is noted. Severe right and moderate left foraminal stenosis is present. Progressive moderate foraminal narrowing is again seen bilaterally, right greater than left. L5-S1: A prominent central disc protrusion is present. The Moderate facet hypertrophy and spurring has progressed. Moderate subarticular narrowing is worse on the left. Moderate foraminal narrowing is also worse on the left without definite change. IMPRESSION: 1. Progressive multilevel spondylosis of the lumbar spine. 2. The L3-4 disc protrusion is worse with standing. 3. Other disc disease does not significantly change with standing. There is no abnormal motion with standing. 4.  Aortic Atherosclerosis (ICD10-I70.0). 5. Mild subarticular and foraminal narrowing at L1-2 is stable, worse on the left. 6. Moderate subarticular stenosis at L2-3 has progressed. There is also progressive moderate right and mild left foraminal narrowing at the same level. 7. Moderate right and mild left subarticular and foraminal stenosis at L3-4 has progressed. 8. The severe right and moderate left foraminal stenosis and moderate bilateral foraminal narrowing at L4-5 has progressed. 9. Progressive facet disease L5 to S for leading to moderate subarticular and foraminal stenosis, left greater than right. Subarticular stenosis has progressed. Foraminal narrowing is stable. Electronically Signed  By: San Morelle M.D.   On: 12/01/2017 13:23    Assessment & Plan:   Kitty was seen today for anemia and urinary tract infection.  Diagnoses and all orders for this visit:  Dysuria- Her UA is positive for nitrites and white blood cells.  Urine culture is pending.  I will empirically treat her for UTI with nitrofurantoin. -     POCT urinalysis dipstick -     Urine Culture; Future -     CULTURE, URINE COMPREHENSIVE;  Future  Acquired hemolytic anemia (HCC)- Her H&H are stable and her haptoglobin is in the normal range so I do not think her current symptoms are related to the history of hemolytic anemia. -     Haptoglobin; Future -     Hepatic function panel; Future -     Bilirubin, fractionated(tot/dir/indir); Future  Zinc deficiency -     CBC with Differential/Platelet; Future  Kidney disease, chronic, stage III (GFR 30-59 ml/min) (HCC)-renal function is stable. -     Basic metabolic panel; Future  Icterus- Her bilirubin level is elevated at 13.9.  There are elevations in both direct and indirect bilirubin.  The test for hemolytic anemia were negative.  She has mildly elevated LFTs and an elevated alk phos.  This is all consistent with an obstructive process.  I therefore ordered a CT scan with contrast to try to identify the cause. -     Haptoglobin; Future -     Hepatic function panel; Future -     CT Abdomen Pelvis W Contrast; Future  Elevated LFTs- as above -     CT Abdomen Pelvis W Contrast; Future  Jaundice -     CT Abdomen Pelvis W Contrast; Future  Acute cystitis without hematuria -     nitrofurantoin, macrocrystal-monohydrate, (MACROBID) 100 MG capsule; Take 1 capsule (100 mg total) by mouth 2 (two) times daily for 5 days.   I am having Jennette Banker start on nitrofurantoin (macrocrystal-monohydrate). I am also having her maintain her CALCIUM 600 + MINERALS, vitamin C, cholecalciferol, torsemide, Multiple Vitamins-Minerals (CENTRUM SILVER PO), IRON PO, meloxicam, Suvorexant, zinc gluconate, pantoprazole, Glucosamine-Chondroit-Vit C-Mn (GLUCOSAMINE 1500 COMPLEX PO), and carvedilol.  Meds ordered this encounter  Medications  . nitrofurantoin, macrocrystal-monohydrate, (MACROBID) 100 MG capsule    Sig: Take 1 capsule (100 mg total) by mouth 2 (two) times daily for 5 days.    Dispense:  10 capsule    Refill:  0     Follow-up: Return in about 3 days (around 03/19/2018).  [name], MD

## 2018-03-16 NOTE — Patient Instructions (Signed)

## 2018-03-17 ENCOUNTER — Other Ambulatory Visit: Payer: Medicare Other

## 2018-03-17 ENCOUNTER — Encounter: Payer: Self-pay | Admitting: Internal Medicine

## 2018-03-17 ENCOUNTER — Other Ambulatory Visit: Payer: Self-pay | Admitting: Internal Medicine

## 2018-03-17 ENCOUNTER — Other Ambulatory Visit: Payer: Self-pay

## 2018-03-17 ENCOUNTER — Ambulatory Visit
Admission: RE | Admit: 2018-03-17 | Discharge: 2018-03-17 | Disposition: A | Discharge status: Home / Self Care | Payer: Medicare Other | Source: Ambulatory Visit | Attending: Internal Medicine | Admitting: Internal Medicine

## 2018-03-17 DIAGNOSIS — R945 Abnormal results of liver function studies: Principal | ICD-10-CM

## 2018-03-17 DIAGNOSIS — R7989 Other specified abnormal findings of blood chemistry: Secondary | ICD-10-CM

## 2018-03-17 DIAGNOSIS — K5730 Diverticulosis of large intestine without perforation or abscess without bleeding: Secondary | ICD-10-CM | POA: Diagnosis not present

## 2018-03-17 DIAGNOSIS — R17 Unspecified jaundice: Secondary | ICD-10-CM

## 2018-03-17 LAB — HAPTOGLOBIN: Haptoglobin: 68 mg/dL (ref 43–212)

## 2018-03-17 LAB — BILIRUBIN, FRACTIONATED(TOT/DIR/INDIR)
BILIRUBIN TOTAL: 13.9 mg/dL — AB (ref 0.2–1.2)
Bilirubin, Direct: 8.1 mg/dL — ABNORMAL HIGH (ref 0.0–0.2)
Indirect Bilirubin: 5.8 mg/dL (calc) — ABNORMAL HIGH (ref 0.2–1.2)

## 2018-03-17 MED ORDER — IOPAMIDOL (ISOVUE-300) INJECTION 61%
80.0000 mL | Freq: Once | INTRAVENOUS | Status: AC | PRN
Start: 2018-03-17 — End: 2018-03-17
  Administered 2018-03-17: 80 mL via INTRAVENOUS

## 2018-03-18 ENCOUNTER — Encounter: Payer: Self-pay | Admitting: Internal Medicine

## 2018-03-18 LAB — CULTURE, URINE COMPREHENSIVE
MICRO NUMBER:: 90489360
SPECIMEN QUALITY:: ADEQUATE

## 2018-03-19 ENCOUNTER — Inpatient Hospital Stay: Admission: RE | Admit: 2018-03-19 | Payer: Medicare Other | Source: Ambulatory Visit

## 2018-03-19 ENCOUNTER — Telehealth: Payer: Self-pay | Admitting: Internal Medicine

## 2018-03-19 ENCOUNTER — Encounter: Payer: Self-pay | Admitting: Internal Medicine

## 2018-03-19 NOTE — Telephone Encounter (Signed)
LVM for patient to call back regarding her MRI.

## 2018-03-19 NOTE — Telephone Encounter (Signed)
Copied from [place]. Topic: General - Other >> Mar 19, 2018  8:19 AM Oneta Rack wrote: Relation to pt: self Call back number: [phone]  Reason for call:  Patient states she would like imaging orders placed [facility] [place], [place], [place] [address] [phone]. Patient spoke with imaging today and they denied receiving orders, imaging informed patient to contact PCP office and stated they can see her today, patient would like a follow up call regarding the status, please advise  >> Mar 19, 2018  8:22 AM Oneta Rack wrote: Relation to pt: self Call back number: [phone]  Reason for call:  Patient states she would like imaging orders placed [facility] [place], [place], [place] [address] [phone]. Patient spoke with imaging today and they denied receiving orders, imaging informed patient to contact PCP office and stated they can see her today, patient would like a follow up call regarding the status, please advise

## 2018-03-19 NOTE — Telephone Encounter (Signed)
Copied from [place]. Topic: General - Other >> Mar 19, 2018  8:19 AM Oneta Rack wrote: Relation to pt: self Call back number: [phone]  Reason for call:  Patient states she would like imaging orders placed [facility] [place], [place], [place] [address] [phone]. Patient spoke with imaging today and they denied receiving orders, imaging informed patient to contact PCP office and stated they can see her today, patient would like a follow up call regarding the status, please advise  >> Mar 19, 2018  8:22 AM Oneta Rack wrote: Relation to pt: self Call back number: [phone]  Reason for call:  Patient states she would like imaging orders placed [facility] [place], [place], [place] [address] [phone]. Patient spoke with imaging today and they denied receiving orders, imaging informed patient to contact PCP office and stated they can see her today, patient would like a follow up call regarding the status, please advise >> Mar 19, 2018  4:13 PM Bea Graff, NT wrote: Pt calling and states that she would like to have her MRCP scan done at [place] and not at the imaging place on [address].

## 2018-03-19 NOTE — Telephone Encounter (Signed)
Pt question answered

## 2018-04-02 ENCOUNTER — Ambulatory Visit: Payer: Medicare Other | Admitting: Internal Medicine

## 2018-04-02 ENCOUNTER — Encounter: Payer: Self-pay | Admitting: Internal Medicine

## 2018-04-02 ENCOUNTER — Encounter

## 2018-04-02 VITALS — BP 140/58 | HR 74 | Ht 63.75 in | Wt 175.8 lb

## 2018-04-02 DIAGNOSIS — R0602 Shortness of breath: Secondary | ICD-10-CM

## 2018-04-02 DIAGNOSIS — I2510 Atherosclerotic heart disease of native coronary artery without angina pectoris: Secondary | ICD-10-CM | POA: Diagnosis not present

## 2018-04-02 DIAGNOSIS — E782 Mixed hyperlipidemia: Secondary | ICD-10-CM

## 2018-04-02 DIAGNOSIS — I350 Nonrheumatic aortic (valve) stenosis: Secondary | ICD-10-CM | POA: Diagnosis not present

## 2018-04-02 NOTE — Progress Notes (Signed)
Cardiology Office Note   Date:  04/02/2018   ID:  Fanta, Wimberley 1932-03-01, MRN [id number]  PCP:  Janith Lima, MD  Cardiologist:   Dorris Carnes, MD    F/U of DOE   History of Present Illness: Pt is an 84 yo with a history of exertional dyaspnea (chronic) and AS I saw the pt in SEpt 2018  At the time she complained of dyspnea Echo showed mild AS  LVEF was vigorous  There was Gr distolic dysfunction with elevated filling pressures.    PT comes in today   Breathing is still short  She feels good when she is stting denis PND   No CP  SHe has to stop and rest often  Doesn't go shopping   Outpatient Medications Prior to Visit  Medication Sig Dispense Refill  . Calcium Carbonate-Vit D-Min (CALCIUM 600 + MINERALS) 600-200 MG-UNIT TABS Take 1 tablet by mouth daily.     . carvedilol (COREG) 12.5 MG tablet Take 1 tablet (12.5 mg total) by mouth 2 (two) times daily with a meal. 180 tablet 1  . cholecalciferol (VITAMIN D) 1000 units tablet Take 1,000 Units by mouth daily.    . Glucosamine-Chondroit-Vit C-Mn (GLUCOSAMINE 1500 COMPLEX PO) Take by mouth.    . IRON PO Take 65 mg by mouth.    . meloxicam (MOBIC) 7.5 MG tablet Take 7.5 mg by mouth daily.     . Multiple Vitamins-Minerals (CENTRUM SILVER PO) Take by mouth.    . pantoprazole (PROTONIX) 40 MG tablet Take 1 tablet (40 mg total) by mouth daily. 90 tablet 1  . torsemide (DEMADEX) 10 MG tablet TAKE 1 TABLET BY MOUTH DAILY 30 tablet 3  . vitamin C (ASCORBIC ACID) 500 MG tablet Take 500 mg by mouth daily.    [name] zinc gluconate 50 MG tablet Take 1 tablet (50 mg total) daily by mouth. 90 tablet 1  . Suvorexant (BELSOMRA) 10 MG TABS Take 1 tablet at bedtime as needed by mouth. (Patient not taking: Reported on 04/02/2018) 30 tablet 5   No facility-administered medications prior to visit.      Allergies:   Sulfonamide derivatives   Past Medical History:  Diagnosis Date  . ANEMIA 08/22/2009  . CAROTID ARTERY STENOSIS 01/16/2010  .  Complication of anesthesia    very hard to wake up from surgery  . DYSPNEA ON EXERTION 11/16/2007  . Gallstones   . GEN OSTEOARTHROSIS INVOLVING MULTIPLE SITES 11/11/2008  . GERD (gastroesophageal reflux disease)   . HEMORRHOIDS, INTERNAL    surgery was in the 90's (per patient)  . NECK PAIN, ACUTE 12/28/2009  . PERIPHERAL EDEMA 10/06/2007  . PVD 10/06/2007  . Unspecified essential hypertension 10/19/2007    Past Surgical History:  Procedure Laterality Date  . ABDOMINAL HYSTERECTOMY    . BUNIONECTOMY     bilat  . CARPAL TUNNEL RELEASE    . CHOLECYSTECTOMY  01/29/2016   at [facility]  . CHOLECYSTECTOMY    . ENDOSCOPIC RETROGRADE CHOLANGIOPANCREATOGRAPHY (ERCP) WITH PROPOFOL N/A 12/01/2015   Procedure: ENDOSCOPIC RETROGRADE CHOLANGIOPANCREATOGRAPHY (ERCP) WITH PROPOFOL;  Surgeon: [name], MD;  Location: [facility];  Service: Endoscopy;  Laterality: N/A;  . ENDOSCOPIC RETROGRADE CHOLANGIOPANCREATOGRAPHY (ERCP) WITH PROPOFOL N/A 02/01/2016   Procedure: ENDOSCOPIC RETROGRADE CHOLANGIOPANCREATOGRAPHY (ERCP) WITH PROPOFOL;  Surgeon: [name], MD;  Location: [facility];  Service: Endoscopy;  Laterality: N/A;  . HAMMER TOE SURGERY    . HEMORRHOID SURGERY    . Hyperplastic colon  polyps, removed  2007   By Dr. Penelope Coop  . JOINT REPLACEMENT  2011   right knee  . OOPHORECTOMY    . right hip replacement    . ROTATOR CUFF REPAIR  2004   left (Dr. Durward Fortes)  . SHOULDER ARTHROSCOPY    . TOTAL HIP ARTHROPLASTY Right 11/26/2016   Procedure: RIGHT TOTAL HIP ARTHROPLASTY;  Surgeon: Garald Balding, MD;  Location: [place];  Service: Orthopedics;  Laterality: Right;  . TOTAL KNEE ARTHROPLASTY Right      Social History:  The patient  reports that she has never smoked. She has never used smokeless tobacco. She reports that she does not drink alcohol or use drugs.   Family History:  The patient's family history includes Cancer in her brother, father, and mother; Leukemia  in her father; Lung cancer in her father; Ovarian cancer in her mother.    ROS:  Please see the history of present illness. All other systems are reviewed and  Negative to the above problem except as noted.    PHYSICAL EXAM: VS:  BP (!) 140/58   Pulse 74   Ht 5' 3.75" (1.619 m)   Wt 175 lb 12.8 oz (79.7 kg)   SpO2 96%   BMI 30.41 kg/m   GEN: Well nourished, well developed, in no acute distress  HEENT: normal  Neck: no JVD Cardiac: RRR; Gr II?VI  No  Rubs, gallops,no edema  Respiratory:  clear to auscultation bilaterally, normal work of breathing GI: soft, nontender, nondistended, + BS  No hepatomegaly  MS: no deformity Moving all extremities   Skin: warm and dry, no rash Neuro:  Strength and sensation are intact Psych: euthymic mood, full affect?   EKG:  EKG is not  ordered today.      Lipid Panel    Component Value Date/Time   CHOL 123 08/14/2017 1143   TRIG 69 08/14/2017 1143   HDL 64 08/14/2017 1143   CHOLHDL 1.9 08/14/2017 1143   CHOLHDL 2 07/09/2016 1136   VLDL 14.8 07/09/2016 1136   LDLCALC 45 08/14/2017 1143      Wt Readings from Last 3 Encounters:  04/02/18 175 lb 12.8 oz (79.7 kg)  03/16/18 174 lb (78.9 kg)  12/15/17 175 lb (79.4 kg)      ASSESSMENT AND PLAN: 1  Dyspnea  Signif   With walking her O2 goes transiently to 84    Up to 90s quick  Review of PFTs from 2015 mod decreased DLCO Will set up for PFTs   Will also set pt to be seen in pulmonary   Echo did show increased filling pressures with mild diastolic dysfunction   Her exam does not support signif increased volume  RV is normal on echo  I do not want to add diuretic and make her dehydrated    Will wait on PFTs and pulmonary eval (Dr Vaughan Browner)  Depending on their recomm/eval consider R heart cath    2  Aortic stenosis  Mild on last echo  Follow  NO change on exam  3  CV dz  Mild plaquing in 2018  4  LIpids  Excellent despite no statin    I would set f/u for 1 year      Current  medicines are reviewed at length with the patient today.  The patient does not have concerns regarding medicines.  Signed, Dorris Carnes, MD  04/02/2018 8:53 AM    [place], [place]  [address] Phone: [phone]; Fax: [phone]

## 2018-04-02 NOTE — H&P (View-Only) (Signed)
Cardiology Office Note   Date:  04/02/2018   ID:  Adelis, Docter 05-13-1932, MRN [id number]  PCP:  Janith Lima, MD  Cardiologist:   Dorris Carnes, MD    F/U of DOE   History of Present Illness: Pt is an 84 yo with a history of exertional dyaspnea (chronic) and AS I saw the pt in SEpt 2018  At the time she complained of dyspnea Echo showed mild AS  LVEF was vigorous  There was Gr distolic dysfunction with elevated filling pressures.    PT comes in today   Breathing is still short  She feels good when she is stting denis PND   No CP  SHe has to stop and rest often  Doesn't go shopping   Outpatient Medications Prior to Visit  Medication Sig Dispense Refill  . Calcium Carbonate-Vit D-Min (CALCIUM 600 + MINERALS) 600-200 MG-UNIT TABS Take 1 tablet by mouth daily.     . carvedilol (COREG) 12.5 MG tablet Take 1 tablet (12.5 mg total) by mouth 2 (two) times daily with a meal. 180 tablet 1  . cholecalciferol (VITAMIN D) 1000 units tablet Take 1,000 Units by mouth daily.    . Glucosamine-Chondroit-Vit C-Mn (GLUCOSAMINE 1500 COMPLEX PO) Take by mouth.    . IRON PO Take 65 mg by mouth.    . meloxicam (MOBIC) 7.5 MG tablet Take 7.5 mg by mouth daily.     . Multiple Vitamins-Minerals (CENTRUM SILVER PO) Take by mouth.    . pantoprazole (PROTONIX) 40 MG tablet Take 1 tablet (40 mg total) by mouth daily. 90 tablet 1  . torsemide (DEMADEX) 10 MG tablet TAKE 1 TABLET BY MOUTH DAILY 30 tablet 3  . vitamin C (ASCORBIC ACID) 500 MG tablet Take 500 mg by mouth daily.    [name] zinc gluconate 50 MG tablet Take 1 tablet (50 mg total) daily by mouth. 90 tablet 1  . Suvorexant (BELSOMRA) 10 MG TABS Take 1 tablet at bedtime as needed by mouth. (Patient not taking: Reported on 04/02/2018) 30 tablet 5   No facility-administered medications prior to visit.      Allergies:   Sulfonamide derivatives   Past Medical History:  Diagnosis Date  . ANEMIA 08/22/2009  . CAROTID ARTERY STENOSIS 01/16/2010  .  Complication of anesthesia    very hard to wake up from surgery  . DYSPNEA ON EXERTION 11/16/2007  . Gallstones   . GEN OSTEOARTHROSIS INVOLVING MULTIPLE SITES 11/11/2008  . GERD (gastroesophageal reflux disease)   . HEMORRHOIDS, INTERNAL    surgery was in the 90's (per patient)  . NECK PAIN, ACUTE 12/28/2009  . PERIPHERAL EDEMA 10/06/2007  . PVD 10/06/2007  . Unspecified essential hypertension 10/19/2007    Past Surgical History:  Procedure Laterality Date  . ABDOMINAL HYSTERECTOMY    . BUNIONECTOMY     bilat  . CARPAL TUNNEL RELEASE    . CHOLECYSTECTOMY  01/29/2016   at [facility]  . CHOLECYSTECTOMY    . ENDOSCOPIC RETROGRADE CHOLANGIOPANCREATOGRAPHY (ERCP) WITH PROPOFOL N/A 12/01/2015   Procedure: ENDOSCOPIC RETROGRADE CHOLANGIOPANCREATOGRAPHY (ERCP) WITH PROPOFOL;  Surgeon: [name], MD;  [facility]: [facility];  Service: Endoscopy;  Laterality: N/A;  . ENDOSCOPIC RETROGRADE CHOLANGIOPANCREATOGRAPHY (ERCP) WITH PROPOFOL N/A 02/01/2016   Procedure: ENDOSCOPIC RETROGRADE CHOLANGIOPANCREATOGRAPHY (ERCP) WITH PROPOFOL;  Surgeon: [name], MD;  Location[facility];  Service: Endoscopy;  Laterality: N/A;  . HAMMER TOE SURGERY    . HEMORRHOID SURGERY    . Hyperplastic colon  polyps, removed  2007   By Dr. Penelope Coop  . JOINT REPLACEMENT  2011   right knee  . OOPHORECTOMY    . right hip replacement    . ROTATOR CUFF REPAIR  2004   left (Dr. Durward Fortes)  . SHOULDER ARTHROSCOPY    . TOTAL HIP ARTHROPLASTY Right 11/26/2016   Procedure: RIGHT TOTAL HIP ARTHROPLASTY;  Surgeon: Garald Balding, MD;  Location: [place];  Service: Orthopedics;  Laterality: Right;  . TOTAL KNEE ARTHROPLASTY Right      Social History:  The patient  reports that she has never smoked. She has never used smokeless tobacco. She reports that she does not drink alcohol or use drugs.   Family History:  The patient's family history includes Cancer in her brother, father, and mother; Leukemia  in her father; Lung cancer in her father; Ovarian cancer in her mother.    ROS:  Please see the history of present illness. All other systems are reviewed and  Negative to the above problem except as noted.    PHYSICAL EXAM: VS:  BP (!) 140/58   Pulse 74   Ht 5' 3.75" (1.619 m)   Wt 175 lb 12.8 oz (79.7 kg)   SpO2 96%   BMI 30.41 kg/m   GEN: Well nourished, well developed, in no acute distress  HEENT: normal  Neck: no JVD Cardiac: RRR; Gr II?VI  No  Rubs, gallops,no edema  Respiratory:  clear to auscultation bilaterally, normal work of breathing GI: soft, nontender, nondistended, + BS  No hepatomegaly  MS: no deformity Moving all extremities   Skin: warm and dry, no rash Neuro:  Strength and sensation are intact Psych: euthymic mood, full affect?   EKG:  EKG is not  ordered today.      Lipid Panel    Component Value Date/Time   CHOL 123 08/14/2017 1143   TRIG 69 08/14/2017 1143   HDL 64 08/14/2017 1143   CHOLHDL 1.9 08/14/2017 1143   CHOLHDL 2 07/09/2016 1136   VLDL 14.8 07/09/2016 1136   LDLCALC 45 08/14/2017 1143      Wt Readings from Last 3 Encounters:  04/02/18 175 lb 12.8 oz (79.7 kg)  03/16/18 174 lb (78.9 kg)  12/15/17 175 lb (79.4 kg)      ASSESSMENT AND PLAN: 1  Dyspnea  Signif   With walking her O2 goes transiently to 84    Up to 90s quick  Review of PFTs from 2015 mod decreased DLCO Will set up for PFTs   Will also set pt to be seen in pulmonary   Echo did show increased filling pressures with mild diastolic dysfunction   Her exam does not support signif increased volume  RV is normal on echo  I do not want to add diuretic and make her dehydrated    Will wait on PFTs and pulmonary eval (Dr Vaughan Browner)  Depending on their recomm/eval consider R heart cath    2  Aortic stenosis  Mild on last echo  Follow  NO change on exam  3  CV dz  Mild plaquing in 2018  4  LIpids  Excellent despite no statin    I would set f/u for 1 year      Current  medicines are reviewed at length with the patient today.  The patient does not have concerns regarding medicines.  Signed, Dorris Carnes, MD  04/02/2018 8:53 AM    [place], [place]  [address] Phone: [phone]; Fax: [phone]

## 2018-04-02 NOTE — Patient Instructions (Signed)
Your physician recommends that you continue on your current medications as directed. Please refer to the Current Medication list given to you today. Your physician has recommended that you have a pulmonary function test. Pulmonary Function Tests are a group of tests that measure how well air moves in and out of your lungs.

## 2018-04-03 ENCOUNTER — Ambulatory Visit
Admission: RE | Admit: 2018-04-03 | Discharge: 2018-04-03 | Disposition: A | Discharge status: Home / Self Care | Payer: Medicare Other | Source: Ambulatory Visit | Attending: Internal Medicine | Admitting: Internal Medicine

## 2018-04-03 ENCOUNTER — Ambulatory Visit (INDEPENDENT_AMBULATORY_CARE_PROVIDER_SITE_OTHER): Payer: Medicare Other | Admitting: Internal Medicine

## 2018-04-03 DIAGNOSIS — R0602 Shortness of breath: Secondary | ICD-10-CM

## 2018-04-03 DIAGNOSIS — R935 Abnormal findings on diagnostic imaging of other abdominal regions, including retroperitoneum: Secondary | ICD-10-CM | POA: Diagnosis not present

## 2018-04-03 DIAGNOSIS — R945 Abnormal results of liver function studies: Secondary | ICD-10-CM

## 2018-04-03 DIAGNOSIS — K8040 Calculus of bile duct with cholecystitis, unspecified, without obstruction: Secondary | ICD-10-CM | POA: Diagnosis not present

## 2018-04-03 DIAGNOSIS — R17 Unspecified jaundice: Secondary | ICD-10-CM

## 2018-04-03 DIAGNOSIS — R7989 Other specified abnormal findings of blood chemistry: Secondary | ICD-10-CM

## 2018-04-03 LAB — PULMONARY FUNCTION TEST
DL/VA % pred: 60 %
DL/VA: 2.87 ml/min/mmHg/L
DLCO UNC % PRED: 51 %
DLCO UNC: 12.28 ml/min/mmHg
FEF 25-75 Post: 0.86 L/sec
FEF 25-75 Pre: 1.94 L/sec
FEF2575-%Change-Post: -55 %
FEF2575-%PRED-PRE: 162 %
FEF2575-%Pred-Post: 71 %
FEV1-%Change-Post: -11 %
FEV1-%PRED-POST: 101 %
FEV1-%Pred-Pre: 113 %
FEV1-POST: 1.79 L
FEV1-Pre: 2.02 L
FEV1FVC-%Change-Post: 0 %
FEV1FVC-%Pred-Pre: 107 %
FEV6-%CHANGE-POST: -10 %
FEV6-%PRED-PRE: 112 %
FEV6-%Pred-Post: 101 %
FEV6-POST: 2.29 L
FEV6-PRE: 2.54 L
FEV6FVC-%Change-Post: 0 %
FEV6FVC-%PRED-POST: 106 %
FEV6FVC-%PRED-PRE: 105 %
FVC-%Change-Post: -10 %
FVC-%PRED-PRE: 107 %
FVC-%Pred-Post: 95 %
FVC-Post: 2.29 L
FVC-Pre: 2.57 L
POST FEV6/FVC RATIO: 100 %
PRE FEV1/FVC RATIO: 79 %
Post FEV1/FVC ratio: 78 %
Pre FEV6/FVC Ratio: 99 %
RV % PRED: 71 %
RV: 1.76 L
TLC % PRED: 86 %
TLC: 4.36 L

## 2018-04-03 MED ORDER — GADOBENATE DIMEGLUMINE 529 MG/ML IV SOLN
8.0000 mL | Freq: Once | INTRAVENOUS | Status: AC | PRN
  Administered 2018-04-03: 8 mL via INTRAVENOUS

## 2018-04-03 NOTE — Progress Notes (Signed)
PFT completed today.  

## 2018-04-04 ENCOUNTER — Other Ambulatory Visit: Payer: Self-pay | Admitting: Internal Medicine

## 2018-04-04 ENCOUNTER — Encounter: Payer: Self-pay | Admitting: Internal Medicine

## 2018-04-04 DIAGNOSIS — K8050 Calculus of bile duct without cholangitis or cholecystitis without obstruction: Secondary | ICD-10-CM

## 2018-04-05 ENCOUNTER — Encounter: Payer: Self-pay | Admitting: Internal Medicine

## 2018-04-06 ENCOUNTER — Telehealth: Payer: Self-pay

## 2018-04-06 ENCOUNTER — Telehealth: Payer: Self-pay | Admitting: *Deleted

## 2018-04-06 ENCOUNTER — Other Ambulatory Visit: Payer: Self-pay

## 2018-04-06 DIAGNOSIS — K8050 Calculus of bile duct without cholangitis or cholecystitis without obstruction: Secondary | ICD-10-CM

## 2018-04-06 MED ORDER — CIPROFLOXACIN HCL 500 MG PO TABS: 500.0000 mg | ORAL_TABLET | Freq: Two times a day (BID) | ORAL | 0 refills | Status: AC

## 2018-04-06 NOTE — Telephone Encounter (Signed)
Reviewed notes from other mychart messages and GI referral notes and message. Informed pt that she would be contacted with more details and instructions from GI soon.

## 2018-04-06 NOTE — Addendum Note (Signed)
Addended by: Earnstine Regal on: 04/06/2018 08:49 AM   Modules accepted: Orders

## 2018-04-06 NOTE — Telephone Encounter (Signed)
ERCP scheduled, pt instructed and medications reviewed.  Patient to call with any questions or concerns. Cipro sent to the pharmacy and labs to be done on arrival at [place], the pt is unable to have today.

## 2018-04-06 NOTE — Telephone Encounter (Signed)
Per [facility] Claudia Lara)... Dr. Ronnald Ramp is wanting to cancel previous referral that was entered or General surg. Pt is needing to see GI provider instead. General referral has been cancel.. Entered GI referral.../lmb

## 2018-04-06 NOTE — Telephone Encounter (Signed)
Claudia Banister, MD  Timothy Lasso, RN        She needs ERCP this week for CBD stone, recurrent. At [place] or Wednesday. Text me the day/time after you book it. Thanks   Her most recent LFTs are from 3 weeks ago, need to repeat now with CBC, CMET.    Please have her start cipro 500mg  po BID until the procedure.

## 2018-04-07 ENCOUNTER — Ambulatory Visit (HOSPITAL_COMMUNITY): Payer: Medicare Other | Admitting: Anesthesiology

## 2018-04-07 ENCOUNTER — Other Ambulatory Visit: Payer: Self-pay

## 2018-04-07 ENCOUNTER — Ambulatory Visit (HOSPITAL_COMMUNITY): Payer: Medicare Other

## 2018-04-07 ENCOUNTER — Ambulatory Visit (HOSPITAL_COMMUNITY)
Admission: RE | Admit: 2018-04-07 | Discharge: 2018-04-07 | Disposition: A | Discharge status: Home / Self Care | Payer: Medicare Other | Source: Ambulatory Visit | Attending: Gastroenterology | Admitting: Gastroenterology

## 2018-04-07 ENCOUNTER — Encounter (HOSPITAL_COMMUNITY): Payer: Self-pay | Admitting: Anesthesiology

## 2018-04-07 ENCOUNTER — Encounter (HOSPITAL_COMMUNITY)
Admission: RE | Disposition: A | Discharge status: Home / Self Care | Payer: Self-pay | Source: Ambulatory Visit | Attending: Gastroenterology

## 2018-04-07 DIAGNOSIS — I739 Peripheral vascular disease, unspecified: Secondary | ICD-10-CM | POA: Diagnosis not present

## 2018-04-07 DIAGNOSIS — K219 Gastro-esophageal reflux disease without esophagitis: Secondary | ICD-10-CM | POA: Diagnosis not present

## 2018-04-07 DIAGNOSIS — I10 Essential (primary) hypertension: Secondary | ICD-10-CM | POA: Insufficient documentation

## 2018-04-07 DIAGNOSIS — Z79899 Other long term (current) drug therapy: Secondary | ICD-10-CM | POA: Insufficient documentation

## 2018-04-07 DIAGNOSIS — K8050 Calculus of bile duct without cholangitis or cholecystitis without obstruction: Secondary | ICD-10-CM | POA: Insufficient documentation

## 2018-04-07 DIAGNOSIS — M8580 Other specified disorders of bone density and structure, unspecified site: Secondary | ICD-10-CM | POA: Diagnosis not present

## 2018-04-07 DIAGNOSIS — I6529 Occlusion and stenosis of unspecified carotid artery: Secondary | ICD-10-CM | POA: Diagnosis not present

## 2018-04-07 DIAGNOSIS — R7989 Other specified abnormal findings of blood chemistry: Secondary | ICD-10-CM | POA: Diagnosis present

## 2018-04-07 HISTORY — PX: SPHINCTEROTOMY: SHX5544

## 2018-04-07 HISTORY — PX: ENDOSCOPIC RETROGRADE CHOLANGIOPANCREATOGRAPHY (ERCP) WITH PROPOFOL: SHX5810

## 2018-04-07 HISTORY — PX: REMOVAL OF STONES: SHX5545

## 2018-04-07 LAB — HEPATIC FUNCTION PANEL
ALBUMIN: 4 g/dL (ref 3.5–5.0)
ALT: 21 U/L (ref 14–54)
AST: 28 U/L (ref 15–41)
Alkaline Phosphatase: 86 U/L (ref 38–126)
BILIRUBIN TOTAL: 2.3 mg/dL — AB (ref 0.3–1.2)
Bilirubin, Direct: 0.5 mg/dL (ref 0.1–0.5)
Indirect Bilirubin: 1.8 mg/dL — ABNORMAL HIGH (ref 0.3–0.9)
TOTAL PROTEIN: 6.5 g/dL (ref 6.5–8.1)

## 2018-04-07 SURGERY — ENDOSCOPIC RETROGRADE CHOLANGIOPANCREATOGRAPHY (ERCP) WITH PROPOFOL
Anesthesia: General

## 2018-04-07 MED ORDER — LACTATED RINGERS IV SOLN
INTRAVENOUS | Status: DC
  Administered 2018-04-07: 08:00:00 via INTRAVENOUS

## 2018-04-07 MED ORDER — SODIUM CHLORIDE 0.9 % IV SOLN: INTRAVENOUS | Status: DC

## 2018-04-07 MED ORDER — FENTANYL CITRATE (PF) 100 MCG/2ML IJ SOLN
INTRAMUSCULAR | Status: DC | PRN
  Administered 2018-04-07: 25 ug via INTRAVENOUS
  Administered 2018-04-07: 50 ug via INTRAVENOUS
  Administered 2018-04-07: 25 ug via INTRAVENOUS

## 2018-04-07 MED ORDER — IOPAMIDOL (ISOVUE-M 300) INJECTION 61%
INTRAMUSCULAR | Status: DC | PRN
  Administered 2018-04-07: 30 mL via INTRATHECAL

## 2018-04-07 MED ORDER — LIDOCAINE 2% (20 MG/ML) 5 ML SYRINGE
INTRAMUSCULAR | Status: DC | PRN
  Administered 2018-04-07: 80 mg via INTRAVENOUS

## 2018-04-07 MED ORDER — FENTANYL CITRATE (PF) 100 MCG/2ML IJ SOLN
INTRAMUSCULAR | Status: AC
  Filled 2018-04-07: qty 2

## 2018-04-07 MED ORDER — CIPROFLOXACIN IN D5W 400 MG/200ML IV SOLN
INTRAVENOUS | Status: DC | PRN
  Administered 2018-04-07: 400 mg via INTRAVENOUS

## 2018-04-07 MED ORDER — SUGAMMADEX SODIUM 200 MG/2ML IV SOLN
INTRAVENOUS | Status: DC | PRN
  Administered 2018-04-07: 200 mg via INTRAVENOUS

## 2018-04-07 MED ORDER — INDOMETHACIN 50 MG RE SUPP
RECTAL | Status: AC
Start: 2018-04-07 — End: ?
  Filled 2018-04-07: qty 2

## 2018-04-07 MED ORDER — LACTATED RINGERS IV SOLN
INTRAVENOUS | Status: DC | PRN
  Administered 2018-04-07: 07:00:00 via INTRAVENOUS

## 2018-04-07 MED ORDER — INDOMETHACIN 50 MG RE SUPP
RECTAL | Status: DC | PRN
  Administered 2018-04-07: 100 mg via RECTAL

## 2018-04-07 MED ORDER — DEXAMETHASONE SODIUM PHOSPHATE 10 MG/ML IJ SOLN
INTRAMUSCULAR | Status: DC | PRN
  Administered 2018-04-07: 10 mg via INTRAVENOUS

## 2018-04-07 MED ORDER — ONDANSETRON HCL 4 MG/2ML IJ SOLN
INTRAMUSCULAR | Status: DC | PRN
  Administered 2018-04-07: 4 mg via INTRAVENOUS

## 2018-04-07 MED ORDER — GLUCAGON HCL RDNA (DIAGNOSTIC) 1 MG IJ SOLR
INTRAMUSCULAR | Status: AC
  Filled 2018-04-07: qty 1

## 2018-04-07 MED ORDER — CIPROFLOXACIN IN D5W 400 MG/200ML IV SOLN
INTRAVENOUS | Status: AC
  Filled 2018-04-07: qty 200

## 2018-04-07 MED ORDER — PROPOFOL 10 MG/ML IV BOLUS
INTRAVENOUS | Status: DC | PRN
  Administered 2018-04-07: 120 mg via INTRAVENOUS

## 2018-04-07 MED ORDER — PROPOFOL 10 MG/ML IV BOLUS
INTRAVENOUS | Status: AC
  Filled 2018-04-07: qty 40

## 2018-04-07 MED ORDER — ROCURONIUM BROMIDE 10 MG/ML (PF) SYRINGE
PREFILLED_SYRINGE | INTRAVENOUS | Status: DC | PRN
  Administered 2018-04-07: 35 mg via INTRAVENOUS

## 2018-04-07 NOTE — Transfer of Care (Signed)
Immediate Anesthesia Transfer of Care Note  Patient: Claudia Lara  Procedure(s) Performed: ENDOSCOPIC RETROGRADE CHOLANGIOPANCREATOGRAPHY (ERCP) WITH PROPOFOL (N/A )  Patient Location: PACU  Anesthesia Type:General  Level of Consciousness: awake, alert  and oriented  Airway & Oxygen Therapy: Patient Spontanous Breathing and Patient connected to face mask oxygen  Post-op Assessment: Report given to RN  Post vital signs: Reviewed and stable  Last Vitals:  Vitals Value Taken Time  BP 204/80 04/07/2018  9:28 AM  Temp 36.6 C 04/07/2018  9:28 AM  Pulse 83 04/07/2018  9:30 AM  Resp 20 04/07/2018  9:30 AM  SpO2 99 % 04/07/2018  9:30 AM  Vitals shown include unvalidated device data.  Last Pain:  Vitals:   04/07/18 0928  TempSrc: Oral  PainSc: 0-No pain         Complications: No apparent anesthesia complications

## 2018-04-07 NOTE — Interval H&P Note (Signed)
History and Physical Interval Note:  04/07/2018 7:51 AM  Claudia Lara  has presented today for surgery, with the diagnosis of CBD stone, recurrent  The various methods of treatment have been discussed with the patient and family. After consideration of risks, benefits and other options for treatment, the patient has consented to  Procedure(s): ENDOSCOPIC RETROGRADE CHOLANGIOPANCREATOGRAPHY (ERCP) WITH PROPOFOL (N/A) as a surgical intervention .  The patient's history has been reviewed, patient examined, no change in status, stable for surgery.  I have reviewed the patient's chart and labs.  Questions were answered to the patient's satisfaction.     [name]

## 2018-04-07 NOTE — Anesthesia Procedure Notes (Signed)
Procedure Name: Intubation Date/Time: 04/07/2018 8:32 AM Performed by: Lavina Hamman, CRNA Pre-anesthesia Checklist: Patient identified, Emergency Drugs available, Suction available, Patient being monitored and Timeout performed Patient Re-evaluated:Patient Re-evaluated prior to induction Oxygen Delivery Method: Circle system utilized Preoxygenation: Pre-oxygenation with 100% oxygen Induction Type: IV induction Ventilation: Mask ventilation without difficulty Laryngoscope Size: Mac and 4 Grade View: Grade II Tube type: Oral Tube size: 7.0 mm Number of attempts: 1 Airway Equipment and Method: Stylet Placement Confirmation: ETT inserted through vocal cords under direct vision,  positive ETCO2,  CO2 detector and breath sounds checked- equal and bilateral Secured at: 21 cm Tube secured with: Tape Dental Injury: Teeth and Oropharynx as per pre-operative assessment  Difficulty Due To: Difficulty was anticipated Comments: Easy mask, prominent dentition, G2 view with cricoid pressure, limited neck mobility, ATOI.

## 2018-04-07 NOTE — Anesthesia Postprocedure Evaluation (Signed)
Anesthesia Post Note  Patient: Claudia Lara  Procedure(s) Performed: ENDOSCOPIC RETROGRADE CHOLANGIOPANCREATOGRAPHY (ERCP) WITH PROPOFOL (N/A ) SPHINCTEROTOMY     Patient location during evaluation: PACU Anesthesia Type: General Level of consciousness: sedated and patient cooperative Pain management: pain level controlled Vital Signs Assessment: post-procedure vital signs reviewed and stable Respiratory status: spontaneous breathing Cardiovascular status: stable Anesthetic complications: no    Last Vitals:  Vitals:   04/07/18 0930 04/07/18 0940  BP: (!) 203/75 (!) 202/70  Pulse: 83 78  Resp: 20 18  Temp:    SpO2: 99% 94%    Last Pain:  Vitals:   04/07/18 0930  TempSrc:   PainSc: 0-No pain                 Nolon Nations

## 2018-04-07 NOTE — Op Note (Signed)
[facility] Patient Name: Claudia Lara Procedure Date: 04/07/2018 MRN: [id number] Attending MD: [name] , MD Date of Birth: December 19, 1931 CSN: [id number] Age: 84 Admit Type: Outpatient Procedure:                ERCP Indications:              Bile duct stone on recent MRCP, transiently                            elevated liver tests; previous ERCPx2 in 2017 for                            the same, lap chole 2017; TB 3 weeks ago was 13, TB                            today 2.3 with 1.8 indirect bili due to her known,                            chronic mild hemolytic process likely. Providers:                [name], MD, Cleda Daub, RN, Cherylynn Ridges, Technician, Arnoldo Hooker, CRNA Referring MD:              Medicines:                General Anesthesia, Cipro 400 mg IV, Indomethacin                            144 mg PR Complications:            No immediate complications. Estimated blood loss:                            None Estimated Blood Loss:     Estimated blood loss: none. Procedure:                Pre-Anesthesia Assessment:                           - Prior to the procedure, a History and Physical                            was performed, and patient medications and                            allergies were reviewed. The patient's tolerance of                            previous anesthesia was also reviewed. The risks                            and benefits of the procedure and the sedation  options and risks were discussed with the patient.                            All questions were answered, and informed consent                            was obtained. Prior Anticoagulants: The patient has                            taken no previous anticoagulant or antiplatelet                            agents. ASA Grade Assessment: II - A patient with                            mild systemic disease. After  reviewing the risks                            and benefits, the patient was deemed in                            satisfactory condition to undergo the procedure.                           After obtaining informed consent, the scope was                            passed under direct vision. Throughout the                            procedure, the patient's blood pressure, pulse, and                            oxygen saturations were monitored continuously. The                            [id number]-[id number] ( [id number] ) was introduced through the                            mouth, and used to inject contrast into and used to                            cannulate the bile duct. The ERCP was accomplished                            without difficulty. The patient tolerated the                            procedure well. Scope In: Scope Out: Findings:      Scout film showed surgical clips in the RUQ. A duodenoscope was advanced       to the region of the major papilla. There was evidence of previous       sphincterotomy but it was not  very patent (lumen 3-53mm through       sphincterotomy site). I used a 58 Autotome over a .035 hydrawire to       cannulate the bile duct and then injected dye. Cholangiogram revealed a       slightly dilated bile duct (CBD 19mm) containing amorphous, small filling       defects (air bubbles vs sludge vs stones). I then used a 9-61mm biliary       retreival balloon to sweep the bile duct several times. Biliary sludge       and some small stone fragments were delivered into the duodenum. There       was no purulence. A completion, occlusion cholangiogram showed no       persistent filling defects. The main pancreatic duct was never       cannulated with wire or injected with dye. Impression:               - Biliary sludge and small stone fragments were                            found in the bile duct and were treated with                            extension of previous  biliary sphincterotomy and                            balloon sweeping of the bile duct.                           - Stop cipro. Moderate Sedation:      N/A- Per Anesthesia Care Recommendation:           - Discharge patient to home (ambulatory).                           - Follow clinically. Procedure Code(s):        --- Professional ---                           [phone], Endoscopic retrograde                            cholangiopancreatography (ERCP); with removal of                            calculi/debris from biliary/pancreatic duct(s)                           43262, Endoscopic retrograde                            cholangiopancreatography (ERCP); with                            sphincterotomy/papillotomy Diagnosis Code(s):        --- Professional ---                           K80.50, Calculus of bile duct without cholangitis  or cholecystitis without obstruction CPT copyright 2017 American Medical Association. All rights reserved. The codes documented in this report are preliminary and upon coder review may  be revised to meet current compliance requirements. [name], MD 04/07/2018 9:30:17 AM This report has been signed electronically. Number of Addenda: 0

## 2018-04-07 NOTE — Anesthesia Preprocedure Evaluation (Signed)
Anesthesia Evaluation  Patient identified by MRN, date of birth, ID band Patient awake    Reviewed: Allergy & Precautions, NPO status , Patient's Chart, lab work & pertinent test results  History of Anesthesia Complications (+) PROLONGED EMERGENCE  Airway Mallampati: I  TM Distance: >3 FB Neck ROM: Full    Dental   Pulmonary    Pulmonary exam normal        Cardiovascular hypertension, + Peripheral Vascular Disease  Normal cardiovascular exam     Neuro/Psych    GI/Hepatic GERD  Medicated and Controlled,  Endo/Other    Renal/GU Renal InsufficiencyRenal disease     Musculoskeletal  (+) Arthritis ,   Abdominal   Peds  Hematology  (+) anemia ,   Anesthesia Other Findings   Reproductive/Obstetrics                             Anesthesia Physical  Anesthesia Plan  ASA: II  Anesthesia Plan: General   Post-op Pain Management:    Induction: Intravenous  PONV Risk Score and Plan: 3 and Ondansetron, Dexamethasone and Treatment may vary due to age or medical condition  Airway Management Planned: Oral ETT  Additional Equipment:   Intra-op Plan:   Post-operative Plan: Extubation in OR  Informed Consent: I have reviewed the patients History and Physical, chart, labs and discussed the procedure including the risks, benefits and alternatives for the proposed anesthesia with the patient or authorized representative who has indicated his/her understanding and acceptance.   Dental advisory given  Plan Discussed with: CRNA  Anesthesia Plan Comments:         Anesthesia Quick Evaluation

## 2018-04-07 NOTE — Discharge Instructions (Signed)
Endoscopic Retrograde Cholangiopancreatogram, Care After °This sheet gives you information about how to care for yourself after your procedure. Your health care provider may also give you more specific instructions. If you have problems or questions, contact your health care provider. °What can I expect after the procedure? °After the procedure, it is common to have: °· Soreness in your throat. °· Nausea. °· Bloating. °· Dizziness. °· Tiredness (fatigue). ° °Follow these instructions at home: °· Take over-the-counter and prescription medicines only as told by your health care provider. °· Do not drive for 24 hours if you were given a medicine to help you relax (sedative) during your procedure. Have someone stay with you for 24 hours after the procedure. °· Return to your normal activities as told by your health care provider. Ask your health care provider what activities are safe for you. °· Return to eating what you normally do as soon as you feel well enough or as told by your health care provider. °· Keep all follow-up visits as told by your health care provider. This is important. °Contact a health care provider if: °· You have pain in your abdomen that does not get better with medicine. °· You develop signs of infection, such as: °? Chills. °? Feeling unwell. °Get help right away if: °· You have difficulty swallowing. °· You have worsening pain in your throat, chest, or abdomen. °· You vomit bright red blood or a substance that looks like coffee grounds. °· You have bloody or very black stools. °· You have a fever. °· You have a sudden increase in swelling (bloating) in your abdomen. °Summary °· After the procedure, it is common to feel tired and to have some discomfort in your throat. °· Contact your health care provider if you have signs of infection--such as chills or feeling unwell--or if you have pain that does not improve with medicine. °· Get help right away if you have trouble swallowing, worsening  pain, bloody or black vomit, bloody or black stools, a fever, or increased swelling in your abdomen. °· Keep all follow-up visits as told by your health care provider. This is important. °This information is not intended to replace advice given to you by your health care provider. Make sure you discuss any questions you have with your health care provider. °Document Released: 09/01/2013 Document Revised: 09/30/2016 Document Reviewed: 09/30/2016 °Elsevier Interactive Patient Education © 2017 Elsevier Inc. ° °

## 2018-04-07 NOTE — Interval H&P Note (Signed)
History and Physical Interval Note:  04/07/2018 7:32 AM  Claudia Lara  has presented today for surgery, with the diagnosis of CBD stone, recurrent  The various methods of treatment have been discussed with the patient and family. After consideration of risks, benefits and other options for treatment, the patient has consented to  Procedure(s): ENDOSCOPIC RETROGRADE CHOLANGIOPANCREATOGRAPHY (ERCP) WITH PROPOFOL (N/A) as a surgical intervention .  The patient's history has been reviewed, patient examined, no change in status, stable for surgery.  I have reviewed the patient's chart and labs.  Questions were answered to the patient's satisfaction.     [name]

## 2018-04-08 ENCOUNTER — Encounter (HOSPITAL_COMMUNITY): Payer: Self-pay | Admitting: Gastroenterology

## 2018-04-13 ENCOUNTER — Other Ambulatory Visit: Payer: Self-pay | Admitting: Internal Medicine

## 2018-04-15 ENCOUNTER — Telehealth: Payer: Self-pay | Admitting: Emergency Medicine

## 2018-04-15 NOTE — Telephone Encounter (Signed)
Called patient to schedule AWV. Patient declined at this time. 

## 2018-04-17 ENCOUNTER — Encounter (HOSPITAL_COMMUNITY): Payer: Self-pay | Admitting: Gastroenterology

## 2018-04-27 ENCOUNTER — Ambulatory Visit: Payer: Medicare Other | Admitting: Pulmonary Disease

## 2018-04-27 ENCOUNTER — Encounter: Payer: Self-pay | Admitting: Pulmonary Disease

## 2018-04-27 ENCOUNTER — Other Ambulatory Visit (INDEPENDENT_AMBULATORY_CARE_PROVIDER_SITE_OTHER): Payer: Medicare Other

## 2018-04-27 DIAGNOSIS — J849 Interstitial pulmonary disease, unspecified: Secondary | ICD-10-CM

## 2018-04-27 DIAGNOSIS — R0602 Shortness of breath: Secondary | ICD-10-CM

## 2018-04-27 LAB — SEDIMENTATION RATE: Sed Rate: 1 mm/hr (ref 0–30)

## 2018-04-27 LAB — C-REACTIVE PROTEIN: CRP: 0.1 mg/dL — AB (ref 0.5–20.0)

## 2018-04-27 NOTE — Patient Instructions (Signed)
We will schedule you for high-resolution CT Check labs today including ANA, rheumatoid factor, CCP, sed rate, CRP Follow-up in 1 to 2 months.

## 2018-04-27 NOTE — Progress Notes (Signed)
Claudia Lara    [id number]    04-05-1932  Primary Care Physician:Jones, Arvid Right, MD  Referring Physician: Janith Lima, MD [address] [place], [place] [address]  Chief complaint: Consult for dyspnea, abnormal PFTs  HPI: 84 year old with history of aortic stenosis, exertional dyspnea, arthritis. Complains of progressive dyspnea for the past 10 years.  Symptoms are mostly on exertion.  She does not have symptoms at rest, no cough, wheezing, mucus production. She follows with Dr. Harrington Challenger for mild aortic stenosis. Echo shows stable aortic stenosis with diastolic dysfunction, elevated filling pressures.  She had PFTs which showed isolated reduction in diffusion capacity and is referred to pulmonary for further evaluation  She has chronic arthritis of the DIP, IP and MP joints.  Denies morning stiffness.  No raynauds, rash, dysphagia, choking on food. She has had recurrent issues with gallstones, biliary obstruction status post cholecystectomy in 2017 and recently underwent ERCP on 3/9 and 04/07/18.  She reports that her symptoms of dyspnea improve whenever the gallstones are removed.  She has been on nitrofurantoin recently for cystitis but not on a prolonged basis.  Pets: No pets, birds, farm animals Occupation: Retired Network engineer Exposures: No relevant exposures.  No leak, hot tub, Jacuzzi.  No down pillows or comforters Smoking history: Never smoker.  Exposed to secondhand smoke as a child no significant travel Travel history: No significant travel Relevant family history: No significant family history of lung issues.  Outpatient Encounter Medications as of 04/27/2018  Medication Sig  . Calcium Carbonate-Vit D-Min (CALCIUM 600 + MINERALS) 600-200 MG-UNIT TABS Take 1 tablet by mouth daily.   . carvedilol (COREG) 12.5 MG tablet Take 1 tablet (12.5 mg total) by mouth 2 (two) times daily with a meal.  . cholecalciferol (VITAMIN D) 1000 units tablet Take 1,000 Units  by mouth daily.  . Glucosamine-Chondroit-Vit C-Mn (GLUCOSAMINE 1500 COMPLEX PO) Take 1 tablet by mouth daily.   . IRON PO Take 65 mg by mouth daily.   . meloxicam (MOBIC) 7.5 MG tablet Take 7.5 mg by mouth daily.   . Multiple Vitamins-Minerals (CENTRUM SILVER PO) Take 1 tablet by mouth daily.   . pantoprazole (PROTONIX) 40 MG tablet TAKE 1 TABLET (40 MG TOTAL) BY MOUTH DAILY.  [name] torsemide (DEMADEX) 10 MG tablet TAKE 1 TABLET BY MOUTH DAILY (Patient taking differently: TAKE 1 TABLET BY MOUTH DAILY AS NEEDED FOR SWELLING)  . vitamin C (ASCORBIC ACID) 500 MG tablet Take 500 mg by mouth daily.  [name] zinc gluconate 50 MG tablet Take 1 tablet (50 mg total) daily by mouth.   No facility-administered encounter medications on file as of 04/27/2018.     Allergies as of 04/27/2018 - Review Complete 04/27/2018  Allergen Reaction Noted  . Sulfonamide derivatives Rash     Past Medical History:  Diagnosis Date  . ANEMIA 08/22/2009  . CAROTID ARTERY STENOSIS 01/16/2010  . Complication of anesthesia    very hard to wake up from surgery  . DYSPNEA ON EXERTION 11/16/2007  . Gallstones   . GEN OSTEOARTHROSIS INVOLVING MULTIPLE SITES 11/11/2008  . GERD (gastroesophageal reflux disease)   . HEMORRHOIDS, INTERNAL    surgery was in the 90's (per patient)  . NECK PAIN, ACUTE 12/28/2009  . PERIPHERAL EDEMA 10/06/2007  . PVD 10/06/2007  . Unspecified essential hypertension 10/19/2007    Past Surgical History:  Procedure Laterality Date  . ABDOMINAL HYSTERECTOMY    . BUNIONECTOMY  bilat  . CARPAL TUNNEL RELEASE    . CHOLECYSTECTOMY  01/29/2016   at [facility]  . CHOLECYSTECTOMY    . ENDOSCOPIC RETROGRADE CHOLANGIOPANCREATOGRAPHY (ERCP) WITH PROPOFOL N/A 12/01/2015   Procedure: ENDOSCOPIC RETROGRADE CHOLANGIOPANCREATOGRAPHY (ERCP) WITH PROPOFOL;  Surgeon: [name], MD;  Location: [facility];  Service: Endoscopy;  Laterality: N/A;  . ENDOSCOPIC RETROGRADE CHOLANGIOPANCREATOGRAPHY  (ERCP) WITH PROPOFOL N/A 02/01/2016   Procedure: ENDOSCOPIC RETROGRADE CHOLANGIOPANCREATOGRAPHY (ERCP) WITH PROPOFOL;  Surgeon: [name], MD;  Location: [facility];  Service: Endoscopy;  Laterality: N/A;  . ENDOSCOPIC RETROGRADE CHOLANGIOPANCREATOGRAPHY (ERCP) WITH PROPOFOL N/A 04/07/2018   Procedure: ENDOSCOPIC RETROGRADE CHOLANGIOPANCREATOGRAPHY (ERCP) WITH PROPOFOL;  Surgeon: [name], MD;  Location: [facility];  Service: Endoscopy;  Laterality: N/A;  . HAMMER TOE SURGERY    . HEMORRHOID SURGERY    . Hyperplastic colon polyps, removed  2007   By Dr. Penelope Coop  . JOINT REPLACEMENT  2011   right knee  . OOPHORECTOMY    . REMOVAL OF STONES  04/07/2018   Procedure: REMOVAL OF STONES;  Surgeon: [name], MD;  Location: [facility];  Service: Endoscopy;;  . right hip replacement    . ROTATOR CUFF REPAIR  2004   left (Dr. Durward Fortes)  . SHOULDER ARTHROSCOPY    . SPHINCTEROTOMY  04/07/2018   Procedure: SPHINCTEROTOMY;  Surgeon: [name], MD;  Location: [facility];  Service: Endoscopy;;  . TOTAL HIP ARTHROPLASTY Right 11/26/2016   Procedure: RIGHT TOTAL HIP ARTHROPLASTY;  Surgeon: Garald Balding, MD;  Location: [facility];  Service: Orthopedics;  Laterality: Right;  . TOTAL KNEE ARTHROPLASTY Right     Family History  Problem Relation Age of Onset  . Ovarian cancer Mother   . Cancer Mother   . Leukemia Father   . Lung cancer Father   . Cancer Father   . Cancer Brother   . Diabetes Neg Hx   . Heart disease Neg Hx   . Hypertension Neg Hx     Social History   Socioeconomic History  . Marital status: Divorced    Spouse name: Not on file  . Number of children: 1  . Years of education: 60  . Highest education level: Not on file  Occupational History  . 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 Financial resource strain: Not on file  . Food insecurity:    Worry: Not on file    Inability: Not on file  .  Transportation needs:    Medical: Not on file    Non-medical: Not on file  Tobacco Use  . Smoking status: Never Smoker  . Smokeless tobacco: Never Used  Substance and Sexual Activity  . Alcohol use: No  . Drug use: No  . Sexual activity: Not Currently  Lifestyle  . Physical activity:    Days per week: Not on file    Minutes per session: Not on file  . Stress: Not on file  Relationships  . Social connections:    Talks on phone: Not on file    Gets together: Not on file    Attends religious service: Not on file    Active member of club or organization: Not on file    Attends meetings of clubs or organizations: Not on file    Relationship status: Not on file  . Intimate partner violence:    Fear of current or ex partner: Not on file  Emotionally abused: Not on file    Physically abused: Not on file    Forced sexual activity: Not on file  Other Topics Concern  . Not on file  Social History Narrative   HSG. Married - divorced for many years.  Retired. Lives alone - very active and independent.   Review of systems: Review of Systems  Constitutional: Negative for fever and chills.  HENT: Negative.   Eyes: Negative for blurred vision.  Respiratory: as per HPI  Cardiovascular: Negative for chest pain and palpitations.  Gastrointestinal: Negative for vomiting, diarrhea, blood per rectum. Genitourinary: Negative for dysuria, urgency, frequency and hematuria.  Musculoskeletal: Negative for myalgias, back pain and joint pain.  Skin: Negative for itching and rash.  Neurological: Negative for dizziness, tremors, focal weakness, seizures and loss of consciousness.  Endo/Heme/Allergies: Negative for environmental allergies.  Psychiatric/Behavioral: Negative for depression, suicidal ideas and hallucinations.  All other systems reviewed and are negative.  Physical Exam: Blood pressure 136/68, pulse 79, height 5\' 3"  (1.6 m), weight 173 lb (78.5 kg), SpO2 98 %. Gen:      No acute  distress HEENT:  EOMI, sclera anicteric Neck:     No masses; no thyromegaly Lungs:    Faint basal crackles. CV:         Regular rate and rhythm; no murmurs Abd:      + bowel sounds; soft, non-tender; no palpable masses, no distension Ext:    No edema; adequate peripheral perfusion Skin:      Warm and dry; no rash Neuro: alert and oriented x 3 Psych: normal mood and affect  Data Reviewed: PFTs 04/03/2018 FVC 2.29 [95%], FEV1 1.79 [101%], F/F 78, TLC 86%, DLCO 51%, DLCO/VA 60% Isolated reduction in diffusion capacity.  Echocardiogram 02/18/2018- LVEF 65-70%, elevated LV filling pressure PA peak pressure 45, grade 1 diastolic dysfunction  CT chest 03/17/2018-mild atelectasis at the bases. Small left pleural effusion.  I have reviewed the images personally.  Assessment:  Evaluation for dyspnea, abnormal PFTs PFTs reviewed which show isolated reduction in diffusion capacity.  This pattern suggests a pulmonary vascular process.  There is no restriction to suggest interstitial lung disease however review of her CT scan abdomen shows subtle opacities at the lung bases. She does have joint symptoms and will need evaluation for rheumatoid arthritis and early ILD.  Schedule high res CT, check serologies for ANA screening, rheumatoid factor, CCP  Overall her breathing is doing better.  She states that after the ERCP her symptoms have improved. I am not sure if it is related as the dyspnea has been present for many years. If the above work-up is negative and her symptoms worsen then we can consider a right heart cath at that time.   Plan/Recommendations: - High res CT - ANA, RF, CCP   Marshell Garfinkel MD [facility] 04/27/2018, 3:55 PM  CC:  Janith Lima, MD   Fay Records MD

## 2018-04-30 LAB — ANA,IFA RA DIAG PNL W/RFLX TIT/PATN
Anti Nuclear Antibody(ANA): NEGATIVE
Rhuematoid fact SerPl-aCnc: <14 IU/mL (ref <14)

## 2018-05-08 ENCOUNTER — Ambulatory Visit
Admission: RE | Admit: 2018-05-08 | Discharge: 2018-05-08 | Disposition: A | Discharge status: Home / Self Care | Payer: Medicare Other | Source: Ambulatory Visit | Attending: Pulmonary Disease | Admitting: Pulmonary Disease

## 2018-05-08 DIAGNOSIS — R911 Solitary pulmonary nodule: Secondary | ICD-10-CM | POA: Diagnosis not present

## 2018-05-08 DIAGNOSIS — J849 Interstitial pulmonary disease, unspecified: Secondary | ICD-10-CM

## 2018-05-11 ENCOUNTER — Telehealth: Payer: Self-pay | Admitting: Pulmonary Disease

## 2018-05-11 NOTE — Telephone Encounter (Signed)
Called and spoke with pt letting her know the results of the HRCT.  Pt expressed understanding. Pt states ever since she had the gallstone removed 1 month ago, her breathing has improved.  Sent results of scan to pt's cardiologist.  Nothing further needed at this time.

## 2018-05-15 ENCOUNTER — Telehealth: Payer: Self-pay | Admitting: Gastroenterology

## 2018-05-18 NOTE — Telephone Encounter (Signed)
Dr Ardis Hughs the letter of medical necessity is attached for your review and possible changes.  If it is correct I can print and send to the insurance company on behalf of the patient.

## 2018-05-18 NOTE — Telephone Encounter (Signed)
Dr Jacobs please advise  

## 2018-05-18 NOTE — Telephone Encounter (Signed)
I read the letter, it looks great. Please send it.   thanks

## 2018-05-18 NOTE — Telephone Encounter (Signed)
Please start the letter. It should read "rectal indomethacin is considered standard of care during ERCP as it has been shown to significantly decrease the chances of ERCP related acute pancreatitis."   Thanks

## 2018-05-18 NOTE — Telephone Encounter (Signed)
The pt is aware that the letter has been written and sent to her via My Chart per her request.

## 2018-06-05 DIAGNOSIS — L720 Epidermal cyst: Secondary | ICD-10-CM | POA: Diagnosis not present

## 2018-06-05 DIAGNOSIS — L570 Actinic keratosis: Secondary | ICD-10-CM | POA: Diagnosis not present

## 2018-06-05 DIAGNOSIS — L819 Disorder of pigmentation, unspecified: Secondary | ICD-10-CM | POA: Diagnosis not present

## 2018-06-09 ENCOUNTER — Ambulatory Visit: Payer: Medicare Other | Admitting: Pulmonary Disease

## 2018-06-09 ENCOUNTER — Encounter: Payer: Self-pay | Admitting: Pulmonary Disease

## 2018-06-09 DIAGNOSIS — R0602 Shortness of breath: Secondary | ICD-10-CM | POA: Diagnosis not present

## 2018-06-09 NOTE — Patient Instructions (Addendum)
I am glad you are feeling well with respect to your breathing  Your CT scan shows very minimal changes which are nonspecific We will monitor this by getting a repeat high-resolution CT in 1 year time Follow-up in clinic after scan.

## 2018-06-09 NOTE — Progress Notes (Signed)
Claudia Lara    [id number]    12/24/1931  Primary Care Physician:Jones, Arvid Right, MD  Referring Physician: Janith Lima, MD [address], [place] [address]  Chief complaint: Follow-up for dyspnea, abnormal PFTs  HPI: 84 year old with history of aortic stenosis, exertional dyspnea, arthritis. Complains of progressive dyspnea for the past 10 years.  Symptoms are mostly on exertion.  She does not have symptoms at rest, no cough, wheezing, mucus production. She follows with Dr. Harrington Challenger for mild aortic stenosis. Echo shows stable aortic stenosis with diastolic dysfunction, elevated filling pressures.  She had PFTs which showed isolated reduction in diffusion capacity and is referred to pulmonary for further evaluation  She has chronic arthritis of the DIP, IP and MP joints.  Denies morning stiffness.  No raynauds, rash, dysphagia, choking on food. She has had recurrent issues with gallstones, biliary obstruction status post cholecystectomy in 2017 and recently underwent ERCP on 3/9 and 04/07/18.  She reports that her symptoms of dyspnea improve whenever the gallstones are removed.  She has been on nitrofurantoin recently for cystitis but not on a prolonged basis.  Pets: No pets, birds, farm animals Occupation: Retired Network engineer Exposures: No relevant exposures.  No leak, hot tub, Jacuzzi.  No down pillows or comforters Smoking history: Never smoker.  Exposed to secondhand smoke as a child no significant travel Travel history: No significant travel Relevant family history: No significant family history of lung issues.  Interim history: States that breathing is doing well since ERCP.  Denies any dyspnea, cough, sputum production, wheezing.  Outpatient Encounter Medications as of 06/09/2018  Medication Sig  . Calcium Carbonate-Vit D-Min (CALCIUM 600 + MINERALS) 600-200 MG-UNIT TABS Take 1 tablet by mouth daily.   . carvedilol (COREG) 12.5 MG tablet Take 1  tablet (12.5 mg total) by mouth 2 (two) times daily with a meal.  . cholecalciferol (VITAMIN D) 1000 units tablet Take 1,000 Units by mouth daily.  . Glucosamine-Chondroit-Vit C-Mn (GLUCOSAMINE 1500 COMPLEX PO) Take 1 tablet by mouth daily.   . IRON PO Take 65 mg by mouth daily.   . meloxicam (MOBIC) 7.5 MG tablet Take 7.5 mg by mouth daily.   . Multiple Vitamins-Minerals (CENTRUM SILVER PO) Take 1 tablet by mouth daily.   . pantoprazole (PROTONIX) 40 MG tablet TAKE 1 TABLET (40 MG TOTAL) BY MOUTH DAILY.  . vitamin C (ASCORBIC ACID) 500 MG tablet Take 500 mg by mouth daily.  [name] torsemide (DEMADEX) 10 MG tablet TAKE 1 TABLET BY MOUTH DAILY (Patient not taking: Reported on 06/09/2018)  . zinc gluconate 50 MG tablet Take 1 tablet (50 mg total) daily by mouth. (Patient not taking: Reported on 06/09/2018)   No facility-administered encounter medications on file as of 06/09/2018.     Allergies as of 06/09/2018 - Review Complete 06/09/2018  Allergen Reaction Noted  . Sulfonamide derivatives Rash     Past Medical History:  Diagnosis Date  . ANEMIA 08/22/2009  . CAROTID ARTERY STENOSIS 01/16/2010  . Complication of anesthesia    very hard to wake up from surgery  . DYSPNEA ON EXERTION 11/16/2007  . Gallstones   . GEN OSTEOARTHROSIS INVOLVING MULTIPLE SITES 11/11/2008  . GERD (gastroesophageal reflux disease)   . HEMORRHOIDS, INTERNAL    surgery was in the 90's (per patient)  . NECK PAIN, ACUTE 12/28/2009  . PERIPHERAL EDEMA 10/06/2007  . PVD 10/06/2007  . Unspecified essential hypertension 10/19/2007    Past  Surgical History:  Procedure Laterality Date  . ABDOMINAL HYSTERECTOMY    . BUNIONECTOMY     bilat  . CARPAL TUNNEL RELEASE    . CHOLECYSTECTOMY  01/29/2016   at [facility]  . CHOLECYSTECTOMY    . ENDOSCOPIC RETROGRADE CHOLANGIOPANCREATOGRAPHY (ERCP) WITH PROPOFOL N/A 12/01/2015   Procedure: ENDOSCOPIC RETROGRADE CHOLANGIOPANCREATOGRAPHY (ERCP) WITH PROPOFOL;  Surgeon:  [name], MD;  Location: [facility];  Service: Endoscopy;  Laterality: N/A;  . ENDOSCOPIC RETROGRADE CHOLANGIOPANCREATOGRAPHY (ERCP) WITH PROPOFOL N/A 02/01/2016   Procedure: ENDOSCOPIC RETROGRADE CHOLANGIOPANCREATOGRAPHY (ERCP) WITH PROPOFOL;  Surgeon: [name], MD;  Location: [facility];  Service: Endoscopy;  Laterality: N/A;  . ENDOSCOPIC RETROGRADE CHOLANGIOPANCREATOGRAPHY (ERCP) WITH PROPOFOL N/A 04/07/2018   Procedure: ENDOSCOPIC RETROGRADE CHOLANGIOPANCREATOGRAPHY (ERCP) WITH PROPOFOL;  Surgeon: [name], MD;  Location: [facility];  Service: Endoscopy;  Laterality: N/A;  . HAMMER TOE SURGERY    . HEMORRHOID SURGERY    . Hyperplastic colon polyps, removed  2007   By Dr. Penelope Coop  . JOINT REPLACEMENT  2011   right knee  . OOPHORECTOMY    . REMOVAL OF STONES  04/07/2018   Procedure: REMOVAL OF STONES;  Surgeon: [name], MD;  Location: [facility];  Service: Endoscopy;;  . right hip replacement    . ROTATOR CUFF REPAIR  2004   left (Dr. Durward Fortes)  . SHOULDER ARTHROSCOPY    . SPHINCTEROTOMY  04/07/2018   Procedure: SPHINCTEROTOMY;  Surgeon: [name], MD;  Location: [facility];  Service: Endoscopy;;  . TOTAL HIP ARTHROPLASTY Right 11/26/2016   Procedure: RIGHT TOTAL HIP ARTHROPLASTY;  Surgeon: Garald Balding, MD;  Location: [facility];  Service: Orthopedics;  Laterality: Right;  . TOTAL KNEE ARTHROPLASTY Right     Family History  Problem Relation Age of Onset  . Ovarian cancer Mother   . Cancer Mother   . Leukemia Father   . Lung cancer Father   . Cancer Father   . Cancer Brother   . Diabetes Neg Hx   . Heart disease Neg Hx   . Hypertension Neg Hx     Social History   Socioeconomic History  . Marital status: Divorced    Spouse name: Not on file  . Number of children: 1  . Years of education: 77  . Highest education level: Not on file  Occupational History  . Occupation: Producer, television/film/video: RETIRED    Comment: retired  Photographer  . Financial resource strain: Not on file  . Food insecurity:    Worry: Not on file    Inability: Not on file  . Transportation needs:    Medical: Not on file    Non-medical: Not on file  Tobacco Use  . Smoking status: Never Smoker  . Smokeless tobacco: Never Used  Substance and Sexual Activity  . Alcohol use: No  . Drug use: No  . Sexual activity: Not Currently  Lifestyle  . Physical activity:    Days per week: Not on file    Minutes per session: Not on file  . Stress: Not on file  Relationships  . Social connections:    Talks on phone: Not on file    Gets together: Not on file    Attends religious service: Not on file    Active member of club or organization: Not on file    Attends meetings of clubs or organizations: Not on file    Relationship status: Not on file  .  Intimate partner violence:    Fear of current or ex partner: Not on file    Emotionally abused: Not on file    Physically abused: Not on file    Forced sexual activity: Not on file  Other Topics Concern  . Not on file  Social History Narrative   HSG. Married - divorced for many years.  Retired. Lives alone - very active and independent.   Review of systems: Review of Systems  Constitutional: Negative for fever and chills.  HENT: Negative.   Eyes: Negative for blurred vision.  Respiratory: as per HPI  Cardiovascular: Negative for chest pain and palpitations.  Gastrointestinal: Negative for vomiting, diarrhea, blood per rectum. Genitourinary: Negative for dysuria, urgency, frequency and hematuria.  Musculoskeletal: Negative for myalgias, back pain and joint pain.  Skin: Negative for itching and rash.  Neurological: Negative for dizziness, tremors, focal weakness, seizures and loss of consciousness.  Endo/Heme/Allergies: Negative for environmental allergies.  Psychiatric/Behavioral: Negative for depression, suicidal ideas and hallucinations.  All other systems reviewed and are  negative.  Physical Exam: Blood pressure 136/68, pulse 79, height 5\' 3"  (1.6 m), weight 173 lb (78.5 kg), SpO2 98 %. Gen:      No acute distress HEENT:  EOMI, sclera anicteric Neck:     No masses; no thyromegaly Lungs:    Faint basal crackles. CV:         Regular rate and rhythm; no murmurs Abd:      + bowel sounds; soft, non-tender; no palpable masses, no distension Ext:    No edema; adequate peripheral perfusion Skin:      Warm and dry; no rash Neuro: alert and oriented x 3 Psych: normal mood and affect  Data Reviewed: PFTs 04/03/2018 FVC 2.29 [95%], FEV1 1.79 [101%], F/F 78, TLC 86%, DLCO 51%, DLCO/VA 60% Isolated reduction in diffusion capacity.  Echocardiogram 02/18/2018- LVEF 65-70%, elevated LV filling pressure PA peak pressure 45, grade 1 diastolic dysfunction  CT chest 03/17/2018-mild atelectasis at the bases. Small left pleural effusion. High-resolution CT 05/08/2018- 7 mm nodule in the right upper lobe.  Minimal basilar groundglass attenuation, septal thickening. I have reviewed the images personally.  Labs ANA, rheumatoid factor, CCP 04/27/2018-negative  Assessment:  Evaluation for dyspnea, abnormal PFTs PFTs reviewed which show isolated reduction in diffusion capacity.  There is no restriction. I have reviewed the high-resolution CT which shows very minimal changes at the bases.  There is no convincing evidence of interstitial lung disease.  Serologies for ANA, rheumatoid factor, CCP are negative We can get a repeat CT in 1 year to see if there is any evolution.  Dyspnea has improved after her recent ERCP.  We will continue to monitor symptoms   Plan/Recommendations: - Follow up high res CT   Marshell Garfinkel MD [facility] 06/09/2018, 2:10 PM  CC:  Janith Lima, MD   Fay Records MD

## 2018-06-15 ENCOUNTER — Encounter (INDEPENDENT_AMBULATORY_CARE_PROVIDER_SITE_OTHER): Payer: Self-pay | Admitting: Orthopedic Surgery

## 2018-06-15 ENCOUNTER — Ambulatory Visit (INDEPENDENT_AMBULATORY_CARE_PROVIDER_SITE_OTHER): Payer: Medicare Other | Admitting: Orthopedic Surgery

## 2018-06-15 ENCOUNTER — Ambulatory Visit (INDEPENDENT_AMBULATORY_CARE_PROVIDER_SITE_OTHER): Payer: Self-pay

## 2018-06-15 DIAGNOSIS — M25572 Pain in left ankle and joints of left foot: Secondary | ICD-10-CM

## 2018-06-15 DIAGNOSIS — M25871 Other specified joint disorders, right ankle and foot: Secondary | ICD-10-CM | POA: Diagnosis not present

## 2018-06-16 ENCOUNTER — Encounter (INDEPENDENT_AMBULATORY_CARE_PROVIDER_SITE_OTHER): Payer: Self-pay | Admitting: Orthopedic Surgery

## 2018-06-16 DIAGNOSIS — M25572 Pain in left ankle and joints of left foot: Secondary | ICD-10-CM | POA: Diagnosis not present

## 2018-06-16 DIAGNOSIS — M25871 Other specified joint disorders, right ankle and foot: Secondary | ICD-10-CM

## 2018-06-16 MED ORDER — LIDOCAINE HCL 1 % IJ SOLN
2.0000 mL | INTRAMUSCULAR | Status: AC | PRN
  Administered 2018-06-16: 2 mL

## 2018-06-16 MED ORDER — METHYLPREDNISOLONE ACETATE 40 MG/ML IJ SUSP
40.0000 mg | INTRAMUSCULAR | Status: AC | PRN
  Administered 2018-06-16: 40 mg via INTRA_ARTICULAR

## 2018-06-16 NOTE — Progress Notes (Signed)
Office Visit Note   Patient: Claudia Lara           Date of Birth: [birth year]           MRN: [id number] Visit Date: 06/15/2018              Requested by: Janith Lima, MD [address] [place], [place] [address] PCP: Janith Lima, MD  Chief Complaint  Patient presents with  . Left Ankle - Pain      HPI: Patient is a 84 year old woman who presents complaining of pain and swelling of the left ankle.  She states that in 2011 she surgical fusion.  Patient states the pain comes and goes she does use meloxicam for her arthritis.  Assessment & Plan: Visit Diagnoses:  1. Pain in left ankle and joints of left foot   2. Impingement syndrome of right ankle     Plan: The left ankle was injected she tolerated this well reevaluate at follow-up.  Follow-Up Instructions: Return in about 1 month (around 07/13/2018).   Ortho Exam  Patient is alert, oriented, no adenopathy, well-dressed, normal affect, normal respiratory effort. Examination patient has a good dorsalis pedis pulse she does have good range of motion of the ankle she has bony spurs and is tender to palpation over the talonavicular joint and is tender to palpation over the ankle joint.  There is no redness no cellulitis no open wounds.  Imaging: No results found. No images are attached to the encounter.  Labs: Lab Results  Component Value Date   HGBA1C 5.2 03/05/2016   HGBA1C 5.1 12/02/2011   ESRSEDRATE 1 04/27/2018   ESRSEDRATE 4 02/14/2014   CRP 0.1 (L) 04/27/2018   REPTSTATUS 11/14/2016 FINAL 11/13/2016   CULT MULTIPLE SPECIES PRESENT, SUGGEST RECOLLECTION (A) 11/13/2016   LABORGA KLEBSIELLA OXYTOCA 06/06/2016     Lab Results  Component Value Date   ALBUMIN 4.0 04/07/2018   ALBUMIN 4.1 03/16/2018   ALBUMIN 4.2 05/05/2017    There is no height or weight on file to calculate BMI.  Orders:  Orders Placed This Encounter  Procedures  . XR Ankle Complete Left   No orders of the defined  types were placed in this encounter.    Procedures: Medium Joint Inj: L ankle on 06/16/2018 6:06 PM Indications: pain and diagnostic evaluation Details: 22 G 1.5 in needle, anteromedial approach Medications: 2 mL lidocaine 1 %; 40 mg methylPREDNISolone acetate 40 MG/ML Outcome: tolerated well, no immediate complications Procedure, treatment alternatives, risks and benefits explained, specific risks discussed. Consent was given by the patient. Immediately prior to procedure a time out was called to verify the correct patient, procedure, equipment, support staff and site/side marked as required. Patient was prepped and draped in the usual sterile fashion.      Clinical Data: No additional findings.  ROS:  All other systems negative, except as noted in the HPI. Review of Systems  Objective: Vital Signs: There were no vitals taken for this visit.  Specialty Comments:  No specialty comments available.  PMFS History: Patient Active Problem List   Diagnosis Date Noted  . Common bile duct stone 04/04/2018  . Jaundice 03/16/2018  . Elevated LFTs 03/16/2018  . Zinc deficiency 10/03/2017  . Primary insomnia 09/30/2017  . Spondylosis, lumbar, with myelopathy 04/09/2017  . Carpal tunnel syndrome, right upper limb 04/09/2017  . Acute cystitis without hematuria 06/06/2016  . GERD (gastroesophageal reflux disease) 08/03/2015  . Hemolytic anemia ([place]) 07/12/2015  .  Deficiency anemia 06/21/2015  . Chronic venous insufficiency 08/10/2014  . Aortic stenosis, mild 06/29/2014  . Left ventricular diastolic dysfunction, NYHA class 1 06/29/2014  . Senile osteopenia 04/21/2014  . Hyperlipidemia with target LDL less than 130 04/21/2014  . Kidney disease, chronic, stage III (GFR 30-59 ml/min) ([place]) 04/21/2014  . Routine health maintenance 07/16/2012  . Carotid artery stenosis without cerebral infarction 01/16/2010  . Essential hypertension 10/19/2007   Past Medical History:  Diagnosis Date    . ANEMIA 08/22/2009  . CAROTID ARTERY STENOSIS 01/16/2010  . Complication of anesthesia    very hard to wake up from surgery  . DYSPNEA ON EXERTION 11/16/2007  . Gallstones   . GEN OSTEOARTHROSIS INVOLVING MULTIPLE SITES 11/11/2008  . GERD (gastroesophageal reflux disease)   . HEMORRHOIDS, INTERNAL    surgery was in the 90's (per patient)  . NECK PAIN, ACUTE 12/28/2009  . PERIPHERAL EDEMA 10/06/2007  . PVD 10/06/2007  . Unspecified essential hypertension 10/19/2007    Family History  Problem Relation Age of Onset  . Ovarian cancer Mother   . Cancer Mother   . Leukemia Father   . Lung cancer Father   . Cancer Father   . Cancer Brother   . Diabetes Neg Hx   . Heart disease Neg Hx   . Hypertension Neg Hx     Past Surgical History:  Procedure Laterality Date  . ABDOMINAL HYSTERECTOMY    . BUNIONECTOMY     bilat  . CARPAL TUNNEL RELEASE    . CHOLECYSTECTOMY  01/29/2016   at [facility]  . CHOLECYSTECTOMY    . ENDOSCOPIC RETROGRADE CHOLANGIOPANCREATOGRAPHY (ERCP) WITH PROPOFOL N/A 12/01/2015   Procedure: ENDOSCOPIC RETROGRADE CHOLANGIOPANCREATOGRAPHY (ERCP) WITH PROPOFOL;  Surgeon: [name], MD;  Location: [facility];  Service: Endoscopy;  Laterality: N/A;  . ENDOSCOPIC RETROGRADE CHOLANGIOPANCREATOGRAPHY (ERCP) WITH PROPOFOL N/A 02/01/2016   Procedure: ENDOSCOPIC RETROGRADE CHOLANGIOPANCREATOGRAPHY (ERCP) WITH PROPOFOL;  Surgeon: [name], MD;  Location: [facility];  Service: Endoscopy;  Laterality: N/A;  . ENDOSCOPIC RETROGRADE CHOLANGIOPANCREATOGRAPHY (ERCP) WITH PROPOFOL N/A 04/07/2018   Procedure: ENDOSCOPIC RETROGRADE CHOLANGIOPANCREATOGRAPHY (ERCP) WITH PROPOFOL;  Surgeon: [name], MD;  Location: [facility];  Service: Endoscopy;  Laterality: N/A;  . HAMMER TOE SURGERY    . HEMORRHOID SURGERY    . Hyperplastic colon polyps, removed  2007   By Dr. Penelope Coop  . JOINT REPLACEMENT  2011   right knee  . OOPHORECTOMY    . REMOVAL OF STONES   04/07/2018   Procedure: REMOVAL OF STONES;  Surgeon: [name], MD;  Location: [facility];  Service: Endoscopy;;  . right hip replacement    . ROTATOR CUFF REPAIR  2004   left (Dr. Durward Fortes)  . SHOULDER ARTHROSCOPY    . SPHINCTEROTOMY  04/07/2018   Procedure: SPHINCTEROTOMY;  Surgeon: [name], MD;  Location: [facility];  Service: Endoscopy;;  . TOTAL HIP ARTHROPLASTY Right 11/26/2016   Procedure: RIGHT TOTAL HIP ARTHROPLASTY;  Surgeon: Garald Balding, MD;  Location: [facility];  Service: Orthopedics;  Laterality: Right;  . TOTAL KNEE ARTHROPLASTY Right    Social History   Occupational History  . Occupation: Producer, television/film/video: RETIRED    Comment: retired  Tobacco Use  . Smoking status: Never Smoker  . Smokeless tobacco: Never Used  Substance and Sexual Activity  . Alcohol use: No  . Drug use: No  . Sexual activity: Not Currently

## 2018-07-08 ENCOUNTER — Other Ambulatory Visit (INDEPENDENT_AMBULATORY_CARE_PROVIDER_SITE_OTHER): Payer: Medicare Other

## 2018-07-08 ENCOUNTER — Encounter: Payer: Self-pay | Admitting: Internal Medicine

## 2018-07-08 ENCOUNTER — Ambulatory Visit (INDEPENDENT_AMBULATORY_CARE_PROVIDER_SITE_OTHER): Payer: Medicare Other | Admitting: Internal Medicine

## 2018-07-08 VITALS — BP 160/50 | HR 76 | Temp 97.9°F | Resp 16 | Ht 63.0 in | Wt 173.0 lb

## 2018-07-08 DIAGNOSIS — N183 Chronic kidney disease, stage 3 unspecified: Secondary | ICD-10-CM

## 2018-07-08 DIAGNOSIS — I10 Essential (primary) hypertension: Secondary | ICD-10-CM

## 2018-07-08 DIAGNOSIS — E60 Dietary zinc deficiency: Secondary | ICD-10-CM

## 2018-07-08 DIAGNOSIS — M4716 Other spondylosis with myelopathy, lumbar region: Secondary | ICD-10-CM | POA: Diagnosis not present

## 2018-07-08 DIAGNOSIS — I519 Heart disease, unspecified: Secondary | ICD-10-CM

## 2018-07-08 DIAGNOSIS — E785 Hyperlipidemia, unspecified: Secondary | ICD-10-CM

## 2018-07-08 DIAGNOSIS — I5189 Other ill-defined heart diseases: Secondary | ICD-10-CM

## 2018-07-08 DIAGNOSIS — R600 Localized edema: Secondary | ICD-10-CM

## 2018-07-08 DIAGNOSIS — Z0001 Encounter for general adult medical examination with abnormal findings: Secondary | ICD-10-CM

## 2018-07-08 DIAGNOSIS — D599 Acquired hemolytic anemia, unspecified: Secondary | ICD-10-CM | POA: Diagnosis not present

## 2018-07-08 DIAGNOSIS — Z0000 Encounter for general adult medical examination without abnormal findings: Secondary | ICD-10-CM

## 2018-07-08 LAB — COMPREHENSIVE METABOLIC PANEL
ALT: 12 U/L (ref 0–35)
AST: 15 U/L (ref 0–37)
Albumin: 4.3 g/dL (ref 3.5–5.2)
Alkaline Phosphatase: 69 U/L (ref 39–117)
BILIRUBIN TOTAL: 2.1 mg/dL — AB (ref 0.2–1.2)
BUN: 30 mg/dL — AB (ref 6–23)
CALCIUM: 9.4 mg/dL (ref 8.4–10.5)
CO2: 27 meq/L (ref 19–32)
CREATININE: 1.35 mg/dL — AB (ref 0.40–1.20)
Chloride: 107 mEq/L (ref 96–112)
GFR: 39.68 mL/min — ABNORMAL LOW (ref >60.00)
GLUCOSE: 129 mg/dL — AB (ref 70–99)
Potassium: 4.2 mEq/L (ref 3.5–5.1)
SODIUM: 141 meq/L (ref 135–145)
Total Protein: 6.4 g/dL (ref 6.0–8.3)

## 2018-07-08 LAB — URINALYSIS, ROUTINE W REFLEX MICROSCOPIC
BILIRUBIN URINE: NEGATIVE
Hgb urine dipstick: NEGATIVE
KETONES UR: NEGATIVE
NITRITE: NEGATIVE
PH: 6.5 (ref 5.0–8.0)
Specific Gravity, Urine: 1.02 (ref 1.000–1.030)
Total Protein, Urine: NEGATIVE
URINE GLUCOSE: NEGATIVE
Urobilinogen, UA: 0.2 (ref 0.0–1.0)

## 2018-07-08 LAB — CBC WITH DIFFERENTIAL/PLATELET
BASOS ABS: 0.1 10*3/uL (ref 0.0–0.1)
BASOS PCT: 0.9 % (ref 0.0–3.0)
EOS ABS: 0.4 10*3/uL (ref 0.0–0.7)
Eosinophils Relative: 6.1 % — ABNORMAL HIGH (ref 0.0–5.0)
HCT: 27.8 % — ABNORMAL LOW (ref 36.0–46.0)
Hemoglobin: 9.9 g/dL — ABNORMAL LOW (ref 12.0–15.0)
LYMPHS ABS: 1.3 10*3/uL (ref 0.7–4.0)
Lymphocytes Relative: 18.8 % (ref 12.0–46.0)
MCHC: 35.5 g/dL (ref 30.0–36.0)
MCV: 92.2 fl (ref 78.0–100.0)
MONO ABS: 0.7 10*3/uL (ref 0.1–1.0)
Monocytes Relative: 9.4 % (ref 3.0–12.0)
NEUTROS PCT: 64.8 % (ref 43.0–77.0)
Neutro Abs: 4.6 10*3/uL (ref 1.4–7.7)
PLATELETS: 219 10*3/uL (ref 150.0–400.0)
RBC: 3.02 Mil/uL — ABNORMAL LOW (ref 3.87–5.11)
RDW: 18.7 % — AB (ref 11.5–15.5)
WBC: 7.2 10*3/uL (ref 4.0–10.5)

## 2018-07-08 LAB — LIPID PANEL
Cholesterol: 119 mg/dL (ref 0–200)
HDL: 53.7 mg/dL (ref >39.00)
NONHDL: 64.85
Total CHOL/HDL Ratio: 2
Triglycerides: 208 mg/dL — ABNORMAL HIGH (ref 0.0–149.0)
VLDL: 41.6 mg/dL — ABNORMAL HIGH (ref 0.0–40.0)

## 2018-07-08 LAB — TSH: TSH: 1.1 u[IU]/mL (ref 0.35–4.50)

## 2018-07-08 LAB — LDL CHOLESTEROL, DIRECT: Direct LDL: 56 mg/dL

## 2018-07-08 LAB — VITAMIN D 25 HYDROXY (VIT D DEFICIENCY, FRACTURES): VITD: 85.01 ng/mL (ref 30.00–100.00)

## 2018-07-08 MED ORDER — TRAMADOL HCL 50 MG PO TABS: 50.0000 mg | ORAL_TABLET | Freq: Two times a day (BID) | ORAL | 5 refills | Status: DC | PRN

## 2018-07-08 MED ORDER — TORSEMIDE 10 MG PO TABS: 10.0000 mg | ORAL_TABLET | Freq: Every day | ORAL | 1 refills | Status: DC

## 2018-07-08 NOTE — Patient Instructions (Signed)

## 2018-07-08 NOTE — Progress Notes (Signed)
Subjective:  Patient ID: Claudia Lara, female    DOB: 08-14-1932  Age: 84 y.o. MRN: [id number]  CC: Hypertension; Congestive Heart Failure; Hyperlipidemia; Annual Exam; and Anemia   HPI DIOSELINA BRUMBAUGH presents for a CPX.  She is concerned that her blood pressure has not well controlled and she has had some recurrent episodes of lower extremity edema around her ankles.  For some reason she is not taking the loop diuretic.  She does not know why.  She is status post removal of a retained common bile duct stone and says she feels well with no episodes of abdominal pain, loss of appetite, or icterus.  Past Medical History:  Diagnosis Date  . ANEMIA 08/22/2009  . CAROTID ARTERY STENOSIS 01/16/2010  . Complication of anesthesia    very hard to wake up from surgery  . DYSPNEA ON EXERTION 11/16/2007  . Gallstones   . GEN OSTEOARTHROSIS INVOLVING MULTIPLE SITES 11/11/2008  . GERD (gastroesophageal reflux disease)   . HEMORRHOIDS, INTERNAL    surgery was in the 90's (per patient)  . NECK PAIN, ACUTE 12/28/2009  . PERIPHERAL EDEMA 10/06/2007  . PVD 10/06/2007  . Unspecified essential hypertension 10/19/2007   Past Surgical History:  Procedure Laterality Date  . ABDOMINAL HYSTERECTOMY    . BUNIONECTOMY     bilat  . CARPAL TUNNEL RELEASE    . CHOLECYSTECTOMY  01/29/2016   at [facility]  . CHOLECYSTECTOMY    . ENDOSCOPIC RETROGRADE CHOLANGIOPANCREATOGRAPHY (ERCP) WITH PROPOFOL N/A 12/01/2015   Procedure: ENDOSCOPIC RETROGRADE CHOLANGIOPANCREATOGRAPHY (ERCP) WITH PROPOFOL;  Surgeon: [name], MD;  [facility];  Service: Endoscopy;  Laterality: N/A;  . ENDOSCOPIC RETROGRADE CHOLANGIOPANCREATOGRAPHY (ERCP) WITH PROPOFOL N/A 02/01/2016   Procedure: ENDOSCOPIC RETROGRADE CHOLANGIOPANCREATOGRAPHY (ERCP) WITH PROPOFOL;  Surgeon: [name], MD;  [facility];  Service: Endoscopy;  Laterality: N/A;  . ENDOSCOPIC RETROGRADE CHOLANGIOPANCREATOGRAPHY  (ERCP) WITH PROPOFOL N/A 04/07/2018   Procedure: ENDOSCOPIC RETROGRADE CHOLANGIOPANCREATOGRAPHY (ERCP) WITH PROPOFOL;  Surgeon: [name], MD;  Location: [facility];  Service: Endoscopy;  Laterality: N/A;  . HAMMER TOE SURGERY    . HEMORRHOID SURGERY    . Hyperplastic colon polyps, removed  2007   By Dr. Penelope Coop  . JOINT REPLACEMENT  2011   right knee  . OOPHORECTOMY    . REMOVAL OF STONES  04/07/2018   Procedure: REMOVAL OF STONES;  Surgeon: [name], MD;  Location: [facility];  Service: Endoscopy;;  . right hip replacement    . ROTATOR CUFF REPAIR  2004   left (Dr. Durward Fortes)  . SHOULDER ARTHROSCOPY    . SPHINCTEROTOMY  04/07/2018   Procedure: SPHINCTEROTOMY;  Surgeon: [name], MD;  Location: [facility];  Service: Endoscopy;;  . TOTAL HIP ARTHROPLASTY Right 11/26/2016   Procedure: RIGHT TOTAL HIP ARTHROPLASTY;  Surgeon: Garald Balding, MD;  Location: [facility];  Service: Orthopedics;  Laterality: Right;  . TOTAL KNEE ARTHROPLASTY Right     reports that she has never smoked. She has never used smokeless tobacco. She reports that she does not drink alcohol or use drugs. family history includes Cancer in her brother, father, and mother; Leukemia in her father; Lung cancer in her father; Ovarian cancer in her mother. Allergies  Allergen Reactions  . Sulfonamide Derivatives Rash    Childhood reaction    Outpatient Medications Prior to Visit  Medication Sig Dispense Refill  . carvedilol (COREG) 12.5 MG tablet Take 1 tablet (12.5 mg total) by mouth  2 (two) times daily with a meal. 180 tablet 1  . cholecalciferol (VITAMIN D) 1000 units tablet Take 1,000 Units by mouth daily.    . IRON PO Take 65 mg by mouth daily.     . Multiple Vitamins-Minerals (CENTRUM SILVER PO) Take 1 tablet by mouth daily.     . pantoprazole (PROTONIX) 40 MG tablet TAKE 1 TABLET (40 MG TOTAL) BY MOUTH DAILY. 90 tablet 1  . vitamin C (ASCORBIC ACID) 500 MG tablet Take 500 mg by mouth daily.      [name] zinc gluconate 50 MG tablet Take 1 tablet (50 mg total) daily by mouth. 90 tablet 1  . Calcium Carbonate-Vit D-Min (CALCIUM 600 + MINERALS) 600-200 MG-UNIT TABS Take 1 tablet by mouth daily.     . Glucosamine-Chondroit-Vit C-Mn (GLUCOSAMINE 1500 COMPLEX PO) Take 1 tablet by mouth daily.     . meloxicam (MOBIC) 7.5 MG tablet Take 7.5 mg by mouth daily.     [name] torsemide (DEMADEX) 10 MG tablet TAKE 1 TABLET BY MOUTH DAILY 30 tablet 3   No facility-administered medications prior to visit.     ROS Review of Systems  Constitutional: Negative for diaphoresis, fatigue and unexpected weight change.  HENT: Negative for trouble swallowing.   Respiratory: Negative for cough, chest tightness, shortness of breath and wheezing.   Cardiovascular: Positive for leg swelling. Negative for chest pain and palpitations.  Gastrointestinal: Negative for abdominal pain, constipation, diarrhea, nausea and vomiting.  Endocrine: Negative.   Genitourinary: Negative.  Negative for difficulty urinating.  Musculoskeletal: Positive for arthralgias and back pain. Negative for joint swelling and myalgias.  Skin: Negative.  Negative for color change and pallor.  Neurological: Negative.  Negative for dizziness, weakness and light-headedness.  Hematological: Negative for adenopathy. Does not bruise/bleed easily.  Psychiatric/Behavioral: Negative.     Objective:  BP (!) 160/50 (BP Location: Left Arm, Patient Position: Sitting, Cuff Size: Normal)   Pulse 76   Temp 97.9 F (36.6 C) (Oral)   Resp 16   Ht 5\' 3"  (1.6 m)   Wt 173 lb (78.5 kg)   SpO2 96%   BMI 30.65 kg/m   BP Readings from Last 3 Encounters:  07/08/18 (!) 160/50  06/09/18 118/64  04/27/18 136/68    Wt Readings from Last 3 Encounters:  07/08/18 173 lb (78.5 kg)  06/09/18 172 lb 3.2 oz (78.1 kg)  04/27/18 173 lb (78.5 kg)    Physical Exam  Constitutional: She is oriented to person, place, and time. No distress.  HENT:  Mouth/Throat:  Oropharynx is clear and moist. No oropharyngeal exudate.  Eyes: Conjunctivae are normal. No scleral icterus.  Neck: Normal range of motion. Neck supple. No JVD present. No thyromegaly present.  Cardiovascular: Normal rate and regular rhythm. Exam reveals no gallop and no friction rub.  Murmur heard.  Systolic murmur is present with a grade of 1/6.  No diastolic murmur is present. Pulmonary/Chest: Effort normal and breath sounds normal. She has no decreased breath sounds. She has no wheezes. She has no rhonchi. She has no rales.  Abdominal: Soft. Normal appearance and bowel sounds are normal. She exhibits no mass. There is no hepatosplenomegaly. There is no tenderness.  Genitourinary:  Genitourinary Comments: Breast, GU, rectal exams were deferred at her request.  Musculoskeletal: She exhibits edema (1+ pitting edema over both ankles). She exhibits no tenderness or deformity.  Lymphadenopathy:    She has no cervical adenopathy.  Neurological: She is alert and oriented to person, place, and time.  Skin: Skin is warm and dry. She is not diaphoretic. No pallor.  Vitals reviewed.   Lab Results  Component Value Date   WBC 7.2 07/08/2018   HGB 9.9 (L) 07/08/2018   HCT 27.8 (L) 07/08/2018   PLT 219.0 07/08/2018   GLUCOSE 129 (H) 07/08/2018   CHOL 119 07/08/2018   TRIG 208.0 (H) 07/08/2018   HDL 53.70 07/08/2018   LDLDIRECT 56.0 07/08/2018   LDLCALC 45 08/14/2017   ALT 12 07/08/2018   AST 15 07/08/2018   NA 141 07/08/2018   K 4.2 07/08/2018   CL 107 07/08/2018   CREATININE 1.35 (H) 07/08/2018   BUN 30 (H) 07/08/2018   CO2 27 07/08/2018   TSH 1.10 07/08/2018   INR 1.66 12/02/2016   HGBA1C 5.2 03/05/2016    Ct Chest High Resolution  Result Date: 05/08/2018 CLINICAL DATA:  84 year old female with shortness of breath with exertion. Nonsmoker. EXAM: CT CHEST WITHOUT CONTRAST TECHNIQUE: Multidetector CT imaging of the chest was performed following the standard protocol without  intravenous contrast. High resolution imaging of the lungs, as well as inspiratory and expiratory imaging, was performed. COMPARISON:  Chest CT 06/19/2006. FINDINGS: Cardiovascular: Heart size is normal. There is no significant pericardial fluid, thickening or pericardial calcification. There is aortic atherosclerosis, as well as atherosclerosis of the great vessels of the mediastinum and the coronary arteries, including calcified atherosclerotic plaque in the left main, left anterior descending, left circumflex and right coronary arteries. Mediastinum/Nodes: No pathologically enlarged mediastinal or hilar lymph nodes. Please note that accurate exclusion of hilar adenopathy is limited on noncontrast CT scans. Moderate-sized hiatal hernia. No axillary lymphadenopathy. Lungs/Pleura: Scattered throughout the lungs bilaterally there are innumerable tiny 1-2 mm pulmonary nodules in the periphery, most compatible with benign areas of mucoid impaction within terminal bronchioles. In addition, in the inferior aspect of the right upper lobe (axial image 70 of series 8) there is a 7 mm nodule which is smoothly marginated, but is new compared to prior chest CT from 06/19/2006. No acute consolidative airspace disease. No pleural effusions. High-resolution images demonstrate minimal bibasilar ground-glass attenuation and septal thickening. No subpleural reticulation, traction bronchiectasis or frank honeycombing. Inspiratory and expiratory imaging demonstrates some very mild air trapping indicative of mild small airways disease. Upper Abdomen: Status post cholecystectomy. Small amount of pneumobilia related to prior sphincterotomy. Aortic atherosclerosis. Musculoskeletal: There are no aggressive appearing lytic or blastic lesions noted in the visualized portions of the skeleton. IMPRESSION: 1. Very subtle changes in the lung bases bilaterally which could be indicative of interstitial lung disease. At this time, the pattern is  highly nonspecific, a CT pattern considered indeterminate for usual interstitial pneumonia (UIP). Repeat high-resolution chest CT is suggested in 12 months if there is persistent clinical concern for interstitial lung disease. 2. New 7 mm nodule in the inferior aspect of the right upper lobe. Attention at time of repeat high-resolution chest CT in 12 months is recommended. If the nodule is stable at time of repeat CT, then future CT at 18-24 months (from today's scan) is considered optional for low-risk patients, but is recommended for high-risk patients. This recommendation follows the consensus statement: Guidelines for Management of Incidental Pulmonary Nodules Detected on CT Images: From the Fleischner Society 2017; Radiology 2017; 284:228-243. 3. Aortic atherosclerosis, in addition to left main and 3 vessel coronary artery disease. Aortic Atherosclerosis (ICD10-I70.0). Electronically Signed   By: Vinnie Langton M.D.   On: 05/08/2018 12:32    Assessment & Plan:   Keenan was  seen today for hypertension, congestive heart failure, hyperlipidemia, annual exam and anemia.  Diagnoses and all orders for this visit:  Essential hypertension- Her blood pressure is not adequately well controlled.  I have asked her to stop taking the anti-inflammatory.  Will restart the loop diuretic.  Her labs are negative for secondary causes or endorgan damage. -     Comprehensive metabolic panel; Future -     TSH; Future -     Urinalysis, Routine w reflex microscopic; Future -     VITAMIN D 25 Hydroxy (Vit-D Deficiency, Fractures); Future -     torsemide (DEMADEX) 10 MG tablet; Take 1 tablet (10 mg total) by mouth daily.  Left ventricular diastolic dysfunction, NYHA class 1- She is symptomatic with this so I have asked her to restart the loop diuretic. -     torsemide (DEMADEX) 10 MG tablet; Take 1 tablet (10 mg total) by mouth daily.  Spondylosis, lumbar, with myelopathy -     traMADol (ULTRAM) 50 MG tablet; Take  1 tablet (50 mg total) by mouth every 12 (twelve) hours as needed.  Kidney disease, chronic, stage III (GFR 30-59 ml/min) ([place])- Her renal function is stable.  I have asked her avoid nephrotoxic agents like anti-inflammatories. -     Comprehensive metabolic panel; Future  Hyperlipidemia with target LDL less than 130- Statin therapy is not indicated. -     Lipid panel; Future -     Comprehensive metabolic panel; Future  Zinc deficiency- She remains mildly anemic.  I will monitor her zinc level. -     Zinc; Future  Acquired hemolytic anemia ([place])- Her H&H are stable.  I do not think she is actively hemolyzing.  She will let me know if she develops any symptoms related to this. -     CBC with Differential/Platelet; Future  Localized edema -     torsemide (DEMADEX) 10 MG tablet; Take 1 tablet (10 mg total) by mouth daily.  Routine health maintenance   I have discontinued [name]. Oppenheimer's CALCIUM 600 + MINERALS, meloxicam, and Glucosamine-Chondroit-Vit C-Mn (GLUCOSAMINE 1500 COMPLEX PO). I have also changed her torsemide. Additionally, I am having her start on traMADol. Lastly, I am having her maintain her vitamin C, cholecalciferol, Multiple Vitamins-Minerals (CENTRUM SILVER PO), IRON PO, zinc gluconate, carvedilol, and pantoprazole.  Meds ordered this encounter  Medications  . torsemide (DEMADEX) 10 MG tablet    Sig: Take 1 tablet (10 mg total) by mouth daily.    Dispense:  90 tablet    Refill:  1  . traMADol (ULTRAM) 50 MG tablet    Sig: Take 1 tablet (50 mg total) by mouth every 12 (twelve) hours as needed.    Dispense:  60 tablet    Refill:  5   See AVS for instructions about healthy living and anticipatory guidance.  Follow-up: Return in about 6 months (around 01/08/2019).  [name], MD

## 2018-07-08 NOTE — Assessment & Plan Note (Signed)

## 2018-07-09 ENCOUNTER — Other Ambulatory Visit: Payer: Self-pay | Admitting: Internal Medicine

## 2018-07-09 DIAGNOSIS — M159 Polyosteoarthritis, unspecified: Secondary | ICD-10-CM

## 2018-07-10 LAB — ZINC: ZINC: 74 ug/dL (ref 60–130)

## 2018-07-13 ENCOUNTER — Ambulatory Visit (INDEPENDENT_AMBULATORY_CARE_PROVIDER_SITE_OTHER): Payer: Medicare Other | Admitting: Orthopedic Surgery

## 2018-07-13 ENCOUNTER — Encounter: Payer: Self-pay | Admitting: Internal Medicine

## 2018-07-13 ENCOUNTER — Encounter (INDEPENDENT_AMBULATORY_CARE_PROVIDER_SITE_OTHER): Payer: Self-pay | Admitting: Orthopedic Surgery

## 2018-07-13 VITALS — Ht 63.75 in | Wt 173.0 lb

## 2018-07-13 DIAGNOSIS — M25872 Other specified joint disorders, left ankle and foot: Secondary | ICD-10-CM | POA: Insufficient documentation

## 2018-07-13 MED ORDER — METHYLPREDNISOLONE ACETATE 40 MG/ML IJ SUSP
40.0000 mg | INTRAMUSCULAR | Status: AC | PRN
  Administered 2018-07-13: 40 mg via INTRA_ARTICULAR

## 2018-07-13 MED ORDER — LIDOCAINE HCL 1 % IJ SOLN
2.0000 mL | INTRAMUSCULAR | Status: AC | PRN
  Administered 2018-07-13: 2 mL

## 2018-07-13 NOTE — Progress Notes (Signed)
Office Visit Note   Patient: Claudia Lara           Date of Birth: [birth year]           MRN: [id number] Visit Date: 07/13/2018              Requested by: Janith Lima, MD [address] [place], [place] [address] PCP: Janith Lima, MD  Chief Complaint  Patient presents with  . Left Ankle - Pain, Follow-up      HPI: Patient is a 84 year old woman with impingement syndrome left ankle she states she did have good interval relief from her previous injection.  Assessment & Plan: Visit Diagnoses:  1. Impingement syndrome of left ankle     Plan: Left ankle was injected from the anterior medial portal she tolerated this well she will follow-up with [place] as needed for the ankle.  Discussed the possibility of arthroscopic intervention for debridement.  Patient is status post a total knee arthroplasty with Dr. Durward Fortes and she states she is been having some increasing pain in her other knee.  Follow-Up Instructions: Return if symptoms worsen or fail to improve.   Ortho Exam  Patient is alert, oriented, no adenopathy, well-dressed, normal affect, normal respiratory effort. Examination patient has an antalgic gait.  She is tender to palpation anteriorly over the joint line anterior drawer is stable she has good pulses there is no instability of the ankle or subtalar joint.  Imaging: No results found. No images are attached to the encounter.  Labs: Lab Results  Component Value Date   HGBA1C 5.2 03/05/2016   HGBA1C 5.1 12/02/2011   ESRSEDRATE 1 04/27/2018   ESRSEDRATE 4 02/14/2014   CRP 0.1 (L) 04/27/2018   REPTSTATUS 11/14/2016 FINAL 11/13/2016   CULT MULTIPLE SPECIES PRESENT, SUGGEST RECOLLECTION (A) 11/13/2016   LABORGA KLEBSIELLA OXYTOCA 06/06/2016     Lab Results  Component Value Date   ALBUMIN 4.3 07/08/2018   ALBUMIN 4.0 04/07/2018   ALBUMIN 4.1 03/16/2018    Body mass index is 29.93 kg/m.  Orders:  No orders of the defined types were placed  in this encounter.  No orders of the defined types were placed in this encounter.    Procedures: Medium Joint Inj: L ankle on 07/13/2018 12:52 PM Indications: pain and diagnostic evaluation Details: 22 G 1.5 in needle, anteromedial approach Medications: 2 mL lidocaine 1 %; 40 mg methylPREDNISolone acetate 40 MG/ML Outcome: tolerated well, no immediate complications Procedure, treatment alternatives, risks and benefits explained, specific risks discussed. Consent was given by the patient. Immediately prior to procedure a time out was called to verify the correct patient, procedure, equipment, support staff and site/side marked as required. Patient was prepped and draped in the usual sterile fashion.      Clinical Data: No additional findings.  ROS:  All other systems negative, except as noted in the HPI. Review of Systems  Objective: Vital Signs: Ht 5' 3.75" (1.619 m)   Wt 173 lb (78.5 kg)   BMI 29.93 kg/m   Specialty Comments:  No specialty comments available.  PMFS History: Patient Active Problem List   Diagnosis Date Noted  . Impingement syndrome of left ankle 07/13/2018  . Zinc deficiency 10/03/2017  . Primary insomnia 09/30/2017  . Spondylosis, lumbar, with myelopathy 04/09/2017  . Carpal tunnel syndrome, right upper limb 04/09/2017  . GERD (gastroesophageal reflux disease) 08/03/2015  . Hemolytic anemia ([place]) 07/12/2015  . Chronic venous insufficiency 08/10/2014  . Aortic stenosis,  mild 06/29/2014  . Left ventricular diastolic dysfunction, NYHA class 1 06/29/2014  . Senile osteopenia 04/21/2014  . Hyperlipidemia with target LDL less than 130 04/21/2014  . Kidney disease, chronic, stage III (GFR 30-59 ml/min) ([place]) 04/21/2014  . Routine health maintenance 07/16/2012  . Carotid artery stenosis without cerebral infarction 01/16/2010  . Essential hypertension 10/19/2007   Past Medical History:  Diagnosis Date  . ANEMIA 08/22/2009  . CAROTID ARTERY STENOSIS  01/16/2010  . Complication of anesthesia    very hard to wake up from surgery  . DYSPNEA ON EXERTION 11/16/2007  . Gallstones   . GEN OSTEOARTHROSIS INVOLVING MULTIPLE SITES 11/11/2008  . GERD (gastroesophageal reflux disease)   . HEMORRHOIDS, INTERNAL    surgery was in the 90's (per patient)  . NECK PAIN, ACUTE 12/28/2009  . PERIPHERAL EDEMA 10/06/2007  . PVD 10/06/2007  . Unspecified essential hypertension 10/19/2007    Family History  Problem Relation Age of Onset  . Ovarian cancer Mother   . Cancer Mother   . Leukemia Father   . Lung cancer Father   . Cancer Father   . Cancer Brother   . Diabetes Neg Hx   . Heart disease Neg Hx   . Hypertension Neg Hx     Past Surgical History:  Procedure Laterality Date  . ABDOMINAL HYSTERECTOMY    . BUNIONECTOMY     bilat  . CARPAL TUNNEL RELEASE    . CHOLECYSTECTOMY  01/29/2016   at [facility]  . CHOLECYSTECTOMY    . ENDOSCOPIC RETROGRADE CHOLANGIOPANCREATOGRAPHY (ERCP) WITH PROPOFOL N/A 12/01/2015   Procedure: ENDOSCOPIC RETROGRADE CHOLANGIOPANCREATOGRAPHY (ERCP) WITH PROPOFOL;  Surgeon: [name], MD;  [facility];  Service: Endoscopy;  Laterality: N/A;  . ENDOSCOPIC RETROGRADE CHOLANGIOPANCREATOGRAPHY (ERCP) WITH PROPOFOL N/A 02/01/2016   Procedure: ENDOSCOPIC RETROGRADE CHOLANGIOPANCREATOGRAPHY (ERCP) WITH PROPOFOL;  Surgeon: [name], MD;  [facility];  Service: Endoscopy;  Laterality: N/A;  . ENDOSCOPIC RETROGRADE CHOLANGIOPANCREATOGRAPHY (ERCP) WITH PROPOFOL N/A 04/07/2018   Procedure: ENDOSCOPIC RETROGRADE CHOLANGIOPANCREATOGRAPHY (ERCP) WITH PROPOFOL;  Surgeon: [name], MD;  [facility];  Service: Endoscopy;  Laterality: N/A;  . HAMMER TOE SURGERY    . HEMORRHOID SURGERY    . Hyperplastic colon polyps, removed  2007   By Dr. Penelope Coop  . JOINT REPLACEMENT  2011   right knee  . OOPHORECTOMY    . REMOVAL OF STONES  04/07/2018   Procedure: REMOVAL OF STONES;   Surgeon: [name], MD;  [facility];  Service: Endoscopy;;  . right hip replacement    . ROTATOR CUFF REPAIR  2004   left (Dr. Durward Fortes)  . SHOULDER ARTHROSCOPY    . SPHINCTEROTOMY  04/07/2018   Procedure: SPHINCTEROTOMY;  Surgeon: [name], MD;  Location: [facility];  Service: Endoscopy;;  . TOTAL HIP ARTHROPLASTY Right 11/26/2016   Procedure: RIGHT TOTAL HIP ARTHROPLASTY;  Surgeon: Garald Balding, MD;  Location: [facility];  Service: Orthopedics;  Laterality: Right;  . TOTAL KNEE ARTHROPLASTY Right    Social History   Occupational History  . Occupation: Producer, television/film/video: RETIRED    Comment: retired  Tobacco Use  . Smoking status: Never Smoker  . Smokeless tobacco: Never Used  Substance and Sexual Activity  . Alcohol use: No  . Drug use: No  . Sexual activity: Not Currently

## 2018-07-15 ENCOUNTER — Other Ambulatory Visit: Payer: Self-pay | Admitting: Internal Medicine

## 2018-07-30 ENCOUNTER — Other Ambulatory Visit: Payer: Self-pay | Admitting: Internal Medicine

## 2018-07-30 DIAGNOSIS — M159 Polyosteoarthritis, unspecified: Secondary | ICD-10-CM

## 2018-09-08 ENCOUNTER — Encounter (INDEPENDENT_AMBULATORY_CARE_PROVIDER_SITE_OTHER): Payer: Self-pay | Admitting: Orthopaedic Surgery

## 2018-09-08 ENCOUNTER — Ambulatory Visit (INDEPENDENT_AMBULATORY_CARE_PROVIDER_SITE_OTHER): Payer: Medicare Other | Admitting: Orthopaedic Surgery

## 2018-09-08 ENCOUNTER — Ambulatory Visit (INDEPENDENT_AMBULATORY_CARE_PROVIDER_SITE_OTHER): Payer: Self-pay

## 2018-09-08 VITALS — BP 139/44 | HR 76 | Resp 18 | Ht 63.75 in | Wt 174.0 lb

## 2018-09-08 DIAGNOSIS — M7541 Impingement syndrome of right shoulder: Secondary | ICD-10-CM

## 2018-09-08 DIAGNOSIS — M25511 Pain in right shoulder: Secondary | ICD-10-CM | POA: Diagnosis not present

## 2018-09-08 MED ORDER — BUPIVACAINE HCL 0.5 % IJ SOLN
2.0000 mL | INTRAMUSCULAR | Status: AC | PRN
  Administered 2018-09-08: 2 mL via INTRA_ARTICULAR

## 2018-09-08 MED ORDER — LIDOCAINE HCL 2 % IJ SOLN
2.0000 mL | INTRAMUSCULAR | Status: AC | PRN
  Administered 2018-09-08: 2 mL

## 2018-09-08 MED ORDER — METHYLPREDNISOLONE ACETATE 40 MG/ML IJ SUSP
80.0000 mg | INTRAMUSCULAR | Status: AC | PRN
  Administered 2018-09-08: 80 mg

## 2018-09-08 NOTE — Progress Notes (Signed)
Office Visit Note   Patient: Claudia Lara           Date of Birth: [birth year]           MRN: [id number] Visit Date: 09/08/2018              Requested by: Janith Lima, MD [address] [place], [place] [address] PCP: Janith Lima, MD   Assessment & Plan: Visit Diagnoses:  1. Acute pain of right shoulder   2. Impingement syndrome of right shoulder     Plan:  #1: Steroid injection subacromially was given without difficulty.  Had improvement in symptoms. #2: Back up in 2 weeks for examination of her left knee   Follow-Up Instructions: Return in about 2 weeks (around 09/22/2018).   Orders:  Orders Placed This Encounter  Procedures  . XR Shoulder Right   No orders of the defined types were placed in this encounter.     Procedures: Large Joint Inj: R subacromial bursa on 09/08/2018 12:09 PM Indications: pain and diagnostic evaluation Details: 25 G 1.5 in needle, anterolateral approach  Arthrogram: No  Medications: 2 mL lidocaine 2 %; 2 mL bupivacaine 0.5 %; 80 mg methylPREDNISolone acetate 40 MG/ML Consent was given by the patient. Immediately prior to procedure a time out was called to verify the correct patient, procedure, equipment, support staff and site/side marked as required. Patient was prepped and draped in the usual sterile fashion.       Clinical Data: No additional findings.   Subjective: Chief Complaint  Patient presents with  . Right Shoulder - Pain  . Left Knee - Pain  . Shoulder Pain    Right shoulder pain x 1 month, bus trip - carrying luggage, no injury, wants inj, no surgery, not diabetic, Meloxicam or Tramadol helps some  . Knee Pain    Left knee pain x 1 month, no surgery to knee, no injury, diffculty walking    HPI  Claudia Lara is a very pleasant 84 year old white female who presents today with a one-month history of pain in her right shoulder without history of injury or trauma.  She is been tried some meloxicam or  tramadol which is been somewhat helpful.  She apparently month ago was on a bus trip was carrying luggage does not remember hurting it but then started having pain after this trip and her right shoulder.  Denies any neurovascular compromise.  Denies any cervical spine pain or radicular type pain.  Review of Systems  Constitutional: Positive for fatigue.  HENT: Negative for trouble swallowing.   Eyes: Negative for pain.  Respiratory: Negative for shortness of breath.   Cardiovascular: Positive for leg swelling.  Gastrointestinal: Negative for constipation.  Endocrine: Negative for cold intolerance.  Genitourinary: Negative for difficulty urinating.  Musculoskeletal: Negative for gait problem.  Skin: Negative for rash.  Allergic/Immunologic: Negative for food allergies.  Neurological: Negative for weakness.  Hematological: Does not bruise/bleed easily.  Psychiatric/Behavioral: Negative for sleep disturbance.     Objective: Vital Signs: BP (!) 139/44 (BP Location: Left Arm, Patient Position: Sitting, Cuff Size: Normal)   Pulse 76   Resp 18   Ht 5' 3.75" (1.619 m)   Wt 174 lb (78.9 kg)   BMI 30.10 kg/m   Physical Exam  Constitutional: She is oriented to person, place, and time. She appears well-developed and well-nourished.  HENT:  Mouth/Throat: Oropharynx is clear and moist.  Eyes: Pupils are equal, round, and reactive to light. EOM  are normal.  Pulmonary/Chest: Effort normal.  Neurological: She is alert and oriented to person, place, and time.  Skin: Skin is warm and dry.  Psychiatric: She has a normal mood and affect. Her behavior is normal.    Ortho Exam  Exam today reveals skin to be intact.  No ecchymosis or erythema.  She has 170 degrees of forward flexion and abduction.  External rotation to 80 degrees internal rotation to only about 30 degrees.  Empty can test in both forward flexion and abduction.  Strength with abduction and internal and external rotation.     Specialty Comments:  No specialty comments available.  Imaging: Xr Shoulder Right  Result Date: 09/08/2018 4 view x-ray of the right shoulder reveals some spurring at the tuberosity.  He does have some sclerosing at the [facility] joint some degenerative changes noted.     PMFS History: Current Outpatient Medications  Medication Sig Dispense Refill  . Calcium Carb-Cholecalciferol (CALCIUM CARBONATE-VITAMIN D3 PO) Take by mouth.    . carvedilol (COREG) 12.5 MG tablet TAKE 1 TABLET (12.5 MG TOTAL) BY MOUTH 2 (TWO) TIMES DAILY WITH A MEAL. 180 tablet 1  . cholecalciferol (VITAMIN D) 1000 units tablet Take 1,000 Units by mouth daily.    . IRON PO Take 65 mg by mouth daily.     . meloxicam (MOBIC) 7.5 MG tablet TAKE 1 TABLET BY MOUTH DAILY 90 tablet 1  . Multiple Vitamins-Minerals (CENTRUM SILVER PO) Take 1 tablet by mouth daily.     . pantoprazole (PROTONIX) 40 MG tablet TAKE 1 TABLET (40 MG TOTAL) BY MOUTH DAILY. 90 tablet 1  . torsemide (DEMADEX) 10 MG tablet Take 1 tablet (10 mg total) by mouth daily. (Patient taking differently: Take 10 mg by mouth daily as needed. ) 90 tablet 1  . traMADol (ULTRAM) 50 MG tablet Take 1 tablet (50 mg total) by mouth every 12 (twelve) hours as needed. 60 tablet 5  . vitamin C (ASCORBIC ACID) 500 MG tablet Take 500 mg by mouth daily.    [name] zinc gluconate 50 MG tablet Take 1 tablet (50 mg total) daily by mouth. (Patient taking differently: Take 50 mg by mouth daily as needed. ) 90 tablet 1   No current facility-administered medications for this visit.     Patient Active Problem List   Diagnosis Date Noted  . Impingement syndrome of left ankle 07/13/2018  . Zinc deficiency 10/03/2017  . Primary insomnia 09/30/2017  . Spondylosis, lumbar, with myelopathy 04/09/2017  . Carpal tunnel syndrome, right upper limb 04/09/2017  . GERD (gastroesophageal reflux disease) 08/03/2015  . Hemolytic anemia ([place]) 07/12/2015  . Chronic venous insufficiency 08/10/2014  .  Aortic stenosis, mild 06/29/2014  . Left ventricular diastolic dysfunction, NYHA class 1 06/29/2014  . Senile osteopenia 04/21/2014  . Hyperlipidemia with target LDL less than 130 04/21/2014  . Kidney disease, chronic, stage III (GFR 30-59 ml/min) ([place]) 04/21/2014  . Routine health maintenance 07/16/2012  . Carotid artery stenosis without cerebral infarction 01/16/2010  . Essential hypertension 10/19/2007   Past Medical History:  Diagnosis Date  . ANEMIA 08/22/2009  . CAROTID ARTERY STENOSIS 01/16/2010  . Complication of anesthesia    very hard to wake up from surgery  . DYSPNEA ON EXERTION 11/16/2007  . Gallstones   . GEN OSTEOARTHROSIS INVOLVING MULTIPLE SITES 11/11/2008  . GERD (gastroesophageal reflux disease)   . HEMORRHOIDS, INTERNAL    surgery was in the 90's (per patient)  . NECK PAIN, ACUTE 12/28/2009  . PERIPHERAL  EDEMA 10/06/2007  . PVD 10/06/2007  . Unspecified essential hypertension 10/19/2007    Family History  Problem Relation Age of Onset  . Ovarian cancer Mother   . Cancer Mother   . Leukemia Father   . Lung cancer Father   . Cancer Father   . Cancer Brother   . Diabetes Neg Hx   . Heart disease Neg Hx   . Hypertension Neg Hx     Past Surgical History:  Procedure Laterality Date  . ABDOMINAL HYSTERECTOMY    . BUNIONECTOMY     bilat  . CARPAL TUNNEL RELEASE    . CHOLECYSTECTOMY  01/29/2016   at [facility]  . CHOLECYSTECTOMY    . ENDOSCOPIC RETROGRADE CHOLANGIOPANCREATOGRAPHY (ERCP) WITH PROPOFOL N/A 12/01/2015   Procedure: ENDOSCOPIC RETROGRADE CHOLANGIOPANCREATOGRAPHY (ERCP) WITH PROPOFOL;  Surgeon: [name], MD;  [facility];  Service: Endoscopy;  Laterality: N/A;  . ENDOSCOPIC RETROGRADE CHOLANGIOPANCREATOGRAPHY (ERCP) WITH PROPOFOL N/A 02/01/2016   Procedure: ENDOSCOPIC RETROGRADE CHOLANGIOPANCREATOGRAPHY (ERCP) WITH PROPOFOL;  Surgeon: [name], MD;  [facility];  Service: Endoscopy;  Laterality: N/A;    . ENDOSCOPIC RETROGRADE CHOLANGIOPANCREATOGRAPHY (ERCP) WITH PROPOFOL N/A 04/07/2018   Procedure: ENDOSCOPIC RETROGRADE CHOLANGIOPANCREATOGRAPHY (ERCP) WITH PROPOFOL;  Surgeon: [name], MD;  [facility];  Service: Endoscopy;  Laterality: N/A;  . HAMMER TOE SURGERY    . HEMORRHOID SURGERY    . Hyperplastic colon polyps, removed  2007   By Dr. Penelope Coop  . JOINT REPLACEMENT  2011   right knee  . OOPHORECTOMY    . REMOVAL OF STONES  04/07/2018   Procedure: REMOVAL OF STONES;  Surgeon: [name], MD;  [facility];  Service: Endoscopy;;  . right hip replacement    . ROTATOR CUFF REPAIR  2004   left (Dr. Durward Fortes)  . SHOULDER ARTHROSCOPY    . SPHINCTEROTOMY  04/07/2018   Procedure: SPHINCTEROTOMY;  Surgeon: [name], MD;  Location: [facility];  Service: Endoscopy;;  . TOTAL HIP ARTHROPLASTY Right 11/26/2016   Procedure: RIGHT TOTAL HIP ARTHROPLASTY;  Surgeon: Garald Balding, MD;  Location: [facility];  Service: Orthopedics;  Laterality: Right;  . TOTAL KNEE ARTHROPLASTY Right    Social History   Occupational History  . Occupation: Producer, television/film/video: RETIRED    Comment: retired  Tobacco Use  . Smoking status: Never Smoker  . Smokeless tobacco: Never Used  Substance and Sexual Activity  . Alcohol use: No  . Drug use: No  . Sexual activity: Not Currently

## 2018-09-25 ENCOUNTER — Ambulatory Visit (INDEPENDENT_AMBULATORY_CARE_PROVIDER_SITE_OTHER): Payer: Medicare Other | Admitting: Orthopaedic Surgery

## 2018-10-24 ENCOUNTER — Other Ambulatory Visit: Payer: Self-pay | Admitting: Internal Medicine

## 2018-11-09 ENCOUNTER — Ambulatory Visit (INDEPENDENT_AMBULATORY_CARE_PROVIDER_SITE_OTHER): Payer: Self-pay

## 2018-11-09 ENCOUNTER — Encounter (INDEPENDENT_AMBULATORY_CARE_PROVIDER_SITE_OTHER): Payer: Self-pay | Admitting: Orthopaedic Surgery

## 2018-11-09 ENCOUNTER — Ambulatory Visit (INDEPENDENT_AMBULATORY_CARE_PROVIDER_SITE_OTHER): Payer: Medicare Other | Admitting: Orthopaedic Surgery

## 2018-11-09 VITALS — BP 156/63 | HR 77 | Ht 63.75 in | Wt 175.0 lb

## 2018-11-09 DIAGNOSIS — M25561 Pain in right knee: Secondary | ICD-10-CM | POA: Diagnosis not present

## 2018-11-09 DIAGNOSIS — Z96651 Presence of right artificial knee joint: Secondary | ICD-10-CM | POA: Insufficient documentation

## 2018-11-09 DIAGNOSIS — G8929 Other chronic pain: Secondary | ICD-10-CM | POA: Insufficient documentation

## 2018-11-09 DIAGNOSIS — M25562 Pain in left knee: Secondary | ICD-10-CM | POA: Diagnosis not present

## 2018-11-09 MED ORDER — LIDOCAINE HCL 1 % IJ SOLN
2.0000 mL | INTRAMUSCULAR | Status: AC | PRN
  Administered 2018-11-09: 2 mL

## 2018-11-09 MED ORDER — METHYLPREDNISOLONE ACETATE 40 MG/ML IJ SUSP
80.0000 mg | INTRAMUSCULAR | Status: AC | PRN
  Administered 2018-11-09: 80 mg

## 2018-11-09 MED ORDER — BUPIVACAINE HCL 0.5 % IJ SOLN
2.0000 mL | INTRAMUSCULAR | Status: AC | PRN
  Administered 2018-11-09: 2 mL via INTRA_ARTICULAR

## 2018-11-09 NOTE — Progress Notes (Signed)
Office Visit Note   Patient: Claudia Lara           Date of Birth: Jun 05, 1932           MRN: [id number] Visit Date: 11/09/2018              Requested by: Janith Lima, MD [address] [place], [place] [address] PCP: Janith Lima, MD   Assessment & Plan: Visit Diagnoses:  1. Chronic pain of left knee   2. Chronic pain of right knee   3. History of total right knee replacement     Plan: Advanced osteoarthritis left knee.  Will inject with cortisone.  I do not see Claudia problems either by exam or by plain film of her right total knee replacement.  I wonder if some of her discomfort is not related to her chronic back problems  Follow-Up Instructions: No follow-ups on file.   Orders:  Orders Placed This Encounter  Procedures  . XR KNEE 3 VIEW RIGHT  . XR KNEE 3 VIEW LEFT   No orders of the defined types were placed in this encounter.     Procedures: Large Joint Inj: L knee on 11/09/2018 1:45 PM Indications: pain and diagnostic evaluation Details: 25 G 1.5 in needle, anteromedial approach  Arthrogram: No  Medications: 2 mL lidocaine 1 %; 2 mL bupivacaine 0.5 %; 80 mg methylPREDNISolone acetate 40 MG/ML Procedure, treatment alternatives, risks and benefits explained, specific risks discussed. Consent was given by the patient. Patient was prepped and draped in the usual sterile fashion.       Clinical Data: No additional findings.   Subjective: Chief Complaint  Patient presents with  . Left Knee - Pain  Claudia Lara is 84 years old visited the office for evaluation of bilateral knee pain.  She is 8 years status post right total knee replacement I has done well until recently when she developed some mild right knee pain after onset of left knee pain.  She denies Claudia injury or trauma.  She is presently having more trouble on the left than the right.  She is not using Claudia ambulatory aid.  Not having Claudia numbness or tingling.  Has used over-the-counter  medicines with some relief.  No sensation of either knee giving way Has chronic history of back problems being followed by the neurosurgeons.  Has had prior injections.  Will occasionally experience some discomfort in her legs when she is on her feet for a length of time or when she walks Claudia distance.  Had an MRI scan of the lumbar spine in 2016 which demonstrates multiple levels of subarticular and foraminal narrowing bilaterally  HPI  Review of Systems   Objective: Vital Signs: BP (!) 156/63   Pulse 77   Ht 5' 3.75" (1.619 m)   Wt 175 lb (79.4 kg)   BMI 30.27 kg/m   Physical Exam Constitutional:      Appearance: She is well-developed.  Eyes:     Pupils: Pupils are equal, round, and reactive to light.  Pulmonary:     Effort: Pulmonary effort is normal.  Skin:    General: Skin is warm and dry.  Neurological:     Mental Status: She is alert and oriented to person, place, and time.  Psychiatric:        Behavior: Behavior normal.     Ortho Exam awake alert and oriented x3.  Comfortable sitting does not walk with a limp.  Straight leg  raise negative bilaterally.  Left knee without effusion.  Full extension and 120 degrees of flexion.  No instability.  Slight patellar crepitation.  Chronic ankle edema with history of ankle arthritis.  Motor is intact.  Painless range of motion both hips.  Right total knee replacement.  Lacks just a few degrees to full extension and flexion 105 degrees.  No significant opening with a varus or valgus stress.  No effusion.  Right knee is little bit larger than the left.  Knee was not hot, red or  Warm.  No ankle swelling.  Motor is intact.  Specialty Comments:  No specialty comments available.  Imaging: No results found.   PMFS History: Patient Active Problem List   Diagnosis Date Noted  . History of total right knee replacement 11/09/2018  . Chronic pain of left knee 11/09/2018  . Impingement syndrome of left ankle 07/13/2018  . Zinc  deficiency 10/03/2017  . Primary insomnia 09/30/2017  . Spondylosis, lumbar, with myelopathy 04/09/2017  . Carpal tunnel syndrome, right upper limb 04/09/2017  . GERD (gastroesophageal reflux disease) 08/03/2015  . Hemolytic anemia ([place]) 07/12/2015  . Chronic venous insufficiency 08/10/2014  . Aortic stenosis, mild 06/29/2014  . Left ventricular diastolic dysfunction, NYHA class 1 06/29/2014  . Senile osteopenia 04/21/2014  . Hyperlipidemia with target LDL less than 130 04/21/2014  . Kidney disease, chronic, stage III (GFR 30-59 ml/min) ([place]) 04/21/2014  . Routine health maintenance 07/16/2012  . Carotid artery stenosis without cerebral infarction 01/16/2010  . Essential hypertension 10/19/2007   Past Medical History:  Diagnosis Date  . ANEMIA 08/22/2009  . CAROTID ARTERY STENOSIS 01/16/2010  . Complication of anesthesia    very hard to wake up from surgery  . DYSPNEA ON EXERTION 11/16/2007  . Gallstones   . GEN OSTEOARTHROSIS INVOLVING MULTIPLE SITES 11/11/2008  . GERD (gastroesophageal reflux disease)   . HEMORRHOIDS, INTERNAL    surgery was in the 90's (per patient)  . NECK PAIN, ACUTE 12/28/2009  . PERIPHERAL EDEMA 10/06/2007  . PVD 10/06/2007  . Unspecified essential hypertension 10/19/2007    Family History  Problem Relation Age of Onset  . Ovarian cancer Mother   . Cancer Mother   . Leukemia Father   . Lung cancer Father   . Cancer Father   . Cancer Brother   . Diabetes Neg Hx   . Heart disease Neg Hx   . Hypertension Neg Hx     Past Surgical History:  Procedure Laterality Date  . ABDOMINAL HYSTERECTOMY    . BUNIONECTOMY     bilat  . CARPAL TUNNEL RELEASE    . CHOLECYSTECTOMY  01/29/2016   at [facility]  . CHOLECYSTECTOMY    . ENDOSCOPIC RETROGRADE CHOLANGIOPANCREATOGRAPHY (ERCP) WITH PROPOFOL N/A 12/01/2015   Procedure: ENDOSCOPIC RETROGRADE CHOLANGIOPANCREATOGRAPHY (ERCP) WITH PROPOFOL;  Surgeon: [name], MD;  [facility];   Service: Endoscopy;  Laterality: N/A;  . ENDOSCOPIC RETROGRADE CHOLANGIOPANCREATOGRAPHY (ERCP) WITH PROPOFOL N/A 02/01/2016   Procedure: ENDOSCOPIC RETROGRADE CHOLANGIOPANCREATOGRAPHY (ERCP) WITH PROPOFOL;  Surgeon: [name], MD;  [facility];  Service: Endoscopy;  Laterality: N/A;  . ENDOSCOPIC RETROGRADE CHOLANGIOPANCREATOGRAPHY (ERCP) WITH PROPOFOL N/A 04/07/2018   Procedure: ENDOSCOPIC RETROGRADE CHOLANGIOPANCREATOGRAPHY (ERCP) WITH PROPOFOL;  Surgeon: [name], MD;  [facility];  Service: Endoscopy;  Laterality: N/A;  . HAMMER TOE SURGERY    . HEMORRHOID SURGERY    . Hyperplastic colon polyps, removed  2007   By Dr. Penelope Coop  . JOINT REPLACEMENT  2011   right knee  . OOPHORECTOMY    . REMOVAL OF STONES  04/07/2018   Procedure: REMOVAL OF STONES;  Surgeon: [name], MD;  [facility];  Service: Endoscopy;;  . right hip replacement    . ROTATOR CUFF REPAIR  2004   left (Dr. Durward Fortes)  . SHOULDER ARTHROSCOPY    . SPHINCTEROTOMY  04/07/2018   Procedure: SPHINCTEROTOMY;  Surgeon: [name], MD;  Location: [facility];  Service: Endoscopy;;  . TOTAL HIP ARTHROPLASTY Right 11/26/2016   Procedure: RIGHT TOTAL HIP ARTHROPLASTY;  Surgeon: Garald Balding, MD;  Location: [facility];  Service: Orthopedics;  Laterality: Right;  . TOTAL KNEE ARTHROPLASTY Right    Social History   Occupational History  . Occupation: Producer, television/film/video: RETIRED    Comment: retired  Tobacco Use  . Smoking status: Never Smoker  . Smokeless tobacco: Never Used  Substance and Sexual Activity  . Alcohol use: No  . Drug use: No  . Sexual activity: Not Currently     Garald Balding, MD   Note - This record has been created using [employer].  Chart creation errors have been sought, but may not always  have been located. Such creation errors do not reflect on  the standard of medical care.

## 2018-12-29 ENCOUNTER — Ambulatory Visit: Payer: Medicare Other | Admitting: Internal Medicine

## 2018-12-30 ENCOUNTER — Ambulatory Visit: Payer: Medicare Other | Admitting: Internal Medicine

## 2019-01-04 DIAGNOSIS — H52203 Unspecified astigmatism, bilateral: Secondary | ICD-10-CM | POA: Diagnosis not present

## 2019-01-04 DIAGNOSIS — Z961 Presence of intraocular lens: Secondary | ICD-10-CM | POA: Diagnosis not present

## 2019-01-04 DIAGNOSIS — H524 Presbyopia: Secondary | ICD-10-CM | POA: Diagnosis not present

## 2019-01-06 DIAGNOSIS — Z471 Aftercare following joint replacement surgery: Secondary | ICD-10-CM | POA: Diagnosis not present

## 2019-01-06 DIAGNOSIS — M25561 Pain in right knee: Secondary | ICD-10-CM | POA: Diagnosis not present

## 2019-01-06 DIAGNOSIS — M170 Bilateral primary osteoarthritis of knee: Secondary | ICD-10-CM | POA: Diagnosis not present

## 2019-01-06 DIAGNOSIS — Z96642 Presence of left artificial hip joint: Secondary | ICD-10-CM | POA: Diagnosis not present

## 2019-01-06 DIAGNOSIS — M25562 Pain in left knee: Secondary | ICD-10-CM | POA: Diagnosis not present

## 2019-01-12 ENCOUNTER — Ambulatory Visit (INDEPENDENT_AMBULATORY_CARE_PROVIDER_SITE_OTHER): Payer: Medicare Other | Admitting: Physical Medicine and Rehabilitation

## 2019-01-12 ENCOUNTER — Telehealth (INDEPENDENT_AMBULATORY_CARE_PROVIDER_SITE_OTHER): Payer: Self-pay | Admitting: Physical Medicine and Rehabilitation

## 2019-01-12 ENCOUNTER — Encounter (INDEPENDENT_AMBULATORY_CARE_PROVIDER_SITE_OTHER): Payer: Self-pay | Admitting: Physical Medicine and Rehabilitation

## 2019-01-12 VITALS — BP 144/59 | HR 75 | Ht 63.0 in | Wt 174.0 lb

## 2019-01-12 DIAGNOSIS — M25551 Pain in right hip: Secondary | ICD-10-CM | POA: Diagnosis not present

## 2019-01-12 DIAGNOSIS — M545 Low back pain: Secondary | ICD-10-CM

## 2019-01-12 DIAGNOSIS — M48061 Spinal stenosis, lumbar region without neurogenic claudication: Secondary | ICD-10-CM | POA: Diagnosis not present

## 2019-01-12 DIAGNOSIS — G8929 Other chronic pain: Secondary | ICD-10-CM

## 2019-01-12 DIAGNOSIS — M47816 Spondylosis without myelopathy or radiculopathy, lumbar region: Secondary | ICD-10-CM

## 2019-01-12 NOTE — Progress Notes (Signed)
.  Numeric Pain Rating Scale and Functional Assessment Average Pain 10 Pain Right Now 0 My pain is intermittent and sharp Pain is worse with: walking Pain improves with: rest   In the last MONTH (on 0-10 scale) has pain interfered with the following?  1. General activity like being  able to carry out your everyday physical activities such as walking, climbing stairs, carrying groceries, or moving a chair?  Rating(9)  2. Relation with others like being able to carry out your usual social activities and roles such as  activities at home, at work and in your community. Rating(9)  3. Enjoyment of life such that you have  been bothered by emotional problems such as feeling anxious, depressed or irritable?  Rating(2)

## 2019-01-12 NOTE — Telephone Encounter (Signed)
Notification or Prior Authorization is not required for the requested services  This [employer] members plan does not currently require a prior authorization for [address]  Decision ID #:[id number]

## 2019-01-21 ENCOUNTER — Other Ambulatory Visit: Payer: Self-pay | Admitting: Internal Medicine

## 2019-01-24 ENCOUNTER — Other Ambulatory Visit: Payer: Self-pay | Admitting: Internal Medicine

## 2019-01-24 DIAGNOSIS — M4716 Other spondylosis with myelopathy, lumbar region: Secondary | ICD-10-CM

## 2019-01-24 DIAGNOSIS — I351 Nonrheumatic aortic (valve) insufficiency: Secondary | ICD-10-CM

## 2019-01-24 HISTORY — DX: Nonrheumatic aortic (valve) insufficiency: I35.1

## 2019-01-25 ENCOUNTER — Ambulatory Visit (INDEPENDENT_AMBULATORY_CARE_PROVIDER_SITE_OTHER): Payer: Self-pay

## 2019-01-25 ENCOUNTER — Ambulatory Visit (INDEPENDENT_AMBULATORY_CARE_PROVIDER_SITE_OTHER): Payer: Medicare Other | Admitting: Physical Medicine and Rehabilitation

## 2019-01-25 ENCOUNTER — Ambulatory Visit: Payer: Medicare Other | Admitting: Internal Medicine

## 2019-01-25 ENCOUNTER — Encounter (INDEPENDENT_AMBULATORY_CARE_PROVIDER_SITE_OTHER): Payer: Self-pay | Admitting: Physical Medicine and Rehabilitation

## 2019-01-25 VITALS — BP 150/59 | HR 70 | Temp 97.7°F

## 2019-01-25 DIAGNOSIS — M5126 Other intervertebral disc displacement, lumbar region: Secondary | ICD-10-CM

## 2019-01-25 DIAGNOSIS — M47816 Spondylosis without myelopathy or radiculopathy, lumbar region: Secondary | ICD-10-CM

## 2019-01-25 DIAGNOSIS — M48061 Spinal stenosis, lumbar region without neurogenic claudication: Secondary | ICD-10-CM

## 2019-01-25 DIAGNOSIS — M25551 Pain in right hip: Secondary | ICD-10-CM

## 2019-01-25 DIAGNOSIS — M5416 Radiculopathy, lumbar region: Secondary | ICD-10-CM | POA: Diagnosis not present

## 2019-01-25 MED ORDER — METHYLPREDNISOLONE ACETATE 80 MG/ML IJ SUSP: 80.0000 mg | Freq: Once | INTRAMUSCULAR | Status: DC

## 2019-01-25 NOTE — Progress Notes (Signed)
 .  Numeric Pain Rating Scale and Functional Assessment Average Pain 10   In the last MONTH (on 0-10 scale) has pain interfered with the following?  1. General activity like being  able to carry out your everyday physical activities such as walking, climbing stairs, carrying groceries, or moving a chair?  Rating(3)   +Driver, -BT, -Dye Allergies.

## 2019-01-26 NOTE — Progress Notes (Signed)
Claudia Lara - 85 y.o. female MRN [id number]  Date of birth: 12/12/1931  Office Visit Note: Visit Date: 01/25/2019 PCP: Janith Lima, MD Referred by: Janith Lima, MD  Subjective: Chief Complaint  Patient presents with  . Lower Back - Pain   HPI: Claudia Lara is a 85 y.o. female who comes in today At the request of her primary physician Dr. [name] for evaluation management of low back and buttock pain particular right hip and thigh pain.  We are going to complete an L4-5 interlaminar epidural steroid injection.  Please see our prior evaluation and management note for further details and justification.  ROS Otherwise per HPI.  Assessment & Plan: Visit Diagnoses:  1. Lumbar radiculopathy   2. Stenosis of lateral recess of lumbar spine   3. Spondylosis without myelopathy or radiculopathy, lumbar region   4. Protruded lumbar disc   5. Pain in right hip     Plan: No additional findings.   Meds & Orders:  Meds ordered this encounter  Medications  . methylPREDNISolone acetate (DEPO-MEDROL) injection 80 mg    Orders Placed This Encounter  Procedures  . XR C-ARM NO REPORT  . Epidural Steroid injection    Follow-up: Return if symptoms worsen or fail to improve.   Procedures: No procedures performed  Lumbar Epidural Steroid Injection - Interlaminar Approach with Fluoroscopic Guidance  Patient: Claudia Lara      Date of Birth: 1932/11/05 MRN: [id number] PCP: Janith Lima, MD      Visit Date: 01/25/2019   Universal Protocol:     Consent Given By: the patient  Position: PRONE  Additional Comments: Vital signs were monitored before and after the procedure. Patient was prepped and draped in the usual sterile fashion. The correct patient, procedure, and site was verified.   Injection Procedure Details:  Procedure Site One Meds Administered:  Meds ordered this encounter  Medications  . methylPREDNISolone acetate (DEPO-MEDROL) injection 80 mg      Laterality: Right  Location/Site:  L4-L5  Needle size: 20 G  Needle type: Tuohy  Needle Placement: Paramedian epidural  Findings:   -Comments: Excellent flow of contrast into the epidural space.  Procedure Details: Using a paramedian approach from the side mentioned above, the region overlying the inferior lamina was localized under fluoroscopic visualization and the soft tissues overlying this structure were infiltrated with 4 ml. of 1% Lidocaine without Epinephrine. The Tuohy needle was inserted into the epidural space using a paramedian approach.   The epidural space was localized using loss of resistance along with lateral and bi-planar fluoroscopic views.  After negative aspirate for air, blood, and CSF, a 2 ml. volume of Isovue-250 was injected into the epidural space and the flow of contrast was observed. Radiographs were obtained for documentation purposes.    The injectate was administered into the level noted above.   Additional Comments:  The patient tolerated the procedure well Dressing: 2 x 2 sterile gauze and Band-Aid    Post-procedure details: Patient was observed during the procedure. Post-procedure instructions were reviewed.  Patient left the clinic in stable condition.    Clinical History: CT LUMBAR MYELOGRAM FINDINGS:  The lumbar spine is imaged from the midbody of T12 through S2-3.  Slight retrolisthesis is present at L2-3. Slight anterolisthesis is present at L4-5. There is no significant interval change.  Atherosclerotic calcifications are present in the aorta without aneurysm. Cholecystectomy is noted. No other discrete solid organ lesions are present. There is  no significant adenopathy. Multiple subcentimeter para-aortic nodes are present.  L1-2: A broad-based disc protrusion is present. Moderate facet hypertrophy is noted bilaterally. Mild subarticular narrowing is worse on the left. Mild foraminal narrowing is worse on the  left. There is no significant change.  L2-3: A broad-based disc protrusion is present. Moderate facet hypertrophy and ligamentum flavum thickening is noted. Moderate subarticular narrowing is worse on the right. This has progressed some. Moderate right and mild left foraminal narrowing is present.  L3-4: A broad-based disc protrusion is present. Advanced facet hypertrophy has progressed. This results in moderate right and mild left subarticular narrowing with progression. Moderate right and mild left foraminal narrowing has progressed some as well.  L4-5: A broad-based disc protrusion is present. Advanced facet hypertrophy is noted. Severe right and moderate left foraminal stenosis is present. Progressive moderate foraminal narrowing is again seen bilaterally, right greater than left.  L5-S1: A prominent central disc protrusion is present. The Moderate facet hypertrophy and spurring has progressed. Moderate subarticular narrowing is worse on the left. Moderate foraminal narrowing is also worse on the left without definite change.  IMPRESSION: 1. Progressive multilevel spondylosis of the lumbar spine. 2. The L3-4 disc protrusion is worse with standing. 3. Other disc disease does not significantly change with standing. There is no abnormal motion with standing. 4.  Aortic Atherosclerosis (ICD10-I70.0). 5. Mild subarticular and foraminal narrowing at L1-2 is stable, worse on the left. 6. Moderate subarticular stenosis at L2-3 has progressed. There is also progressive moderate right and mild left foraminal narrowing at the same level. 7. Moderate right and mild left subarticular and foraminal stenosis at L3-4 has progressed. 8. The severe right and moderate left foraminal stenosis and moderate bilateral foraminal narrowing at L4-5 has progressed. 9. Progressive facet disease L5 to S for leading to moderate subarticular and foraminal stenosis, left greater than  right. Subarticular stenosis has progressed. Foraminal narrowing is stable.   Electronically Signed   By: San Morelle M.D.   On: 12/01/2017 13:23   She reports that she has never smoked. She has never used smokeless tobacco. No results for input(s): HGBA1C, LABURIC in the last 8760 hours.  Objective:  VS:  HT:    WT:   BMI:     BP:(!) 150/59  HR:70bpm  TEMP:97.7 F (36.5 C)(Oral)  RESP:  Physical Exam  Ortho Exam Imaging: No results found.  Past Medical/Family/Surgical/Social History: Medications & Allergies reviewed per EMR, new medications updated. Patient Active Problem List   Diagnosis Date Noted  . History of total right knee replacement 11/09/2018  . Chronic pain of left knee 11/09/2018  . Impingement syndrome of left ankle 07/13/2018  . Zinc deficiency 10/03/2017  . Primary insomnia 09/30/2017  . Spondylosis, lumbar, with myelopathy 04/09/2017  . Carpal tunnel syndrome, right upper limb 04/09/2017  . GERD (gastroesophageal reflux disease) 08/03/2015  . Hemolytic anemia ([employer]) 07/12/2015  . Chronic venous insufficiency 08/10/2014  . Aortic stenosis, mild 06/29/2014  . Left ventricular diastolic dysfunction, NYHA class 1 06/29/2014  . Senile osteopenia 04/21/2014  . Hyperlipidemia with target LDL less than 130 04/21/2014  . Kidney disease, chronic, stage III (GFR 30-59 ml/min) ([place]) 04/21/2014  . Routine health maintenance 07/16/2012  . Carotid artery stenosis without cerebral infarction 01/16/2010  . Essential hypertension 10/19/2007   Past Medical History:  Diagnosis Date  . ANEMIA 08/22/2009  . CAROTID ARTERY STENOSIS 01/16/2010  . Complication of anesthesia    very hard to wake up from surgery  . DYSPNEA  ON EXERTION 11/16/2007  . Gallstones   . GEN OSTEOARTHROSIS INVOLVING MULTIPLE SITES 11/11/2008  . GERD (gastroesophageal reflux disease)   . HEMORRHOIDS, INTERNAL    surgery was in the 90's (per patient)  . NECK PAIN, ACUTE 12/28/2009  .  PERIPHERAL EDEMA 10/06/2007  . PVD 10/06/2007  . Unspecified essential hypertension 10/19/2007   Family History  Problem Relation Age of Onset  . Ovarian cancer Mother   . Cancer Mother   . Leukemia Father   . Lung cancer Father   . Cancer Father   . Cancer Brother   . Diabetes Neg Hx   . Heart disease Neg Hx   . Hypertension Neg Hx    Past Surgical History:  Procedure Laterality Date  . ABDOMINAL HYSTERECTOMY    . BUNIONECTOMY     bilat  . CARPAL TUNNEL RELEASE    . CHOLECYSTECTOMY  01/29/2016   at [facility]  . CHOLECYSTECTOMY    . ENDOSCOPIC RETROGRADE CHOLANGIOPANCREATOGRAPHY (ERCP) WITH PROPOFOL N/A 12/01/2015   Procedure: ENDOSCOPIC RETROGRADE CHOLANGIOPANCREATOGRAPHY (ERCP) WITH PROPOFOL;  Surgeon: [name], MD;  Location: [facility];  Service: Endoscopy;  Laterality: N/A;  . ENDOSCOPIC RETROGRADE CHOLANGIOPANCREATOGRAPHY (ERCP) WITH PROPOFOL N/A 02/01/2016   Procedure: ENDOSCOPIC RETROGRADE CHOLANGIOPANCREATOGRAPHY (ERCP) WITH PROPOFOL;  Surgeon: [name], MD;  Location: [facility];  Service: Endoscopy;  Laterality: N/A;  . ENDOSCOPIC RETROGRADE CHOLANGIOPANCREATOGRAPHY (ERCP) WITH PROPOFOL N/A 04/07/2018   Procedure: ENDOSCOPIC RETROGRADE CHOLANGIOPANCREATOGRAPHY (ERCP) WITH PROPOFOL;  Surgeon: [name], MD;  Location: [facility];  Service: Endoscopy;  Laterality: N/A;  . HAMMER TOE SURGERY    . HEMORRHOID SURGERY    . Hyperplastic colon polyps, removed  2007   By Dr. Penelope Coop  . JOINT REPLACEMENT  2011   right knee  . OOPHORECTOMY    . REMOVAL OF STONES  04/07/2018   Procedure: REMOVAL OF STONES;  Surgeon: [name], MD;  Location: [facility];  Service: Endoscopy;;  . right hip replacement    . ROTATOR CUFF REPAIR  2004   left (Dr. Durward Fortes)  . SHOULDER ARTHROSCOPY    . SPHINCTEROTOMY  04/07/2018   Procedure: SPHINCTEROTOMY;  Surgeon: [name], MD;  Location: [facility];  Service: Endoscopy;;  . TOTAL HIP  ARTHROPLASTY Right 11/26/2016   Procedure: RIGHT TOTAL HIP ARTHROPLASTY;  Surgeon: Garald Balding, MD;  Location: [facility];  Service: Orthopedics;  Laterality: Right;  . TOTAL KNEE ARTHROPLASTY Right    Social History   Occupational History  . Occupation: Producer, television/film/video: RETIRED    Comment: retired  Tobacco Use  . Smoking status: Never Smoker  . Smokeless tobacco: Never Used  Substance and Sexual Activity  . Alcohol use: No  . Drug use: No  . Sexual activity: Not Currently

## 2019-01-26 NOTE — Procedures (Signed)
Lumbar Epidural Steroid Injection - Interlaminar Approach with Fluoroscopic Guidance  Patient: Claudia Lara      Date of Birth: 02-Mar-1932 MRN: [id number] PCP: Janith Lima, MD      Visit Date: 01/25/2019   Universal Protocol:     Consent Given By: the patient  Position: PRONE  Additional Comments: Vital signs were monitored before and after the procedure. Patient was prepped and draped in the usual sterile fashion. The correct patient, procedure, and site was verified.   Injection Procedure Details:  Procedure Site One Meds Administered:  Meds ordered this encounter  Medications  . methylPREDNISolone acetate (DEPO-MEDROL) injection 80 mg     Laterality: Right  Location/Site:  L4-L5  Needle size: 20 G  Needle type: Tuohy  Needle Placement: Paramedian epidural  Findings:   -Comments: Excellent flow of contrast into the epidural space.  Procedure Details: Using a paramedian approach from the side mentioned above, the region overlying the inferior lamina was localized under fluoroscopic visualization and the soft tissues overlying this structure were infiltrated with 4 ml. of 1% Lidocaine without Epinephrine. The Tuohy needle was inserted into the epidural space using a paramedian approach.   The epidural space was localized using loss of resistance along with lateral and bi-planar fluoroscopic views.  After negative aspirate for air, blood, and CSF, a 2 ml. volume of Isovue-250 was injected into the epidural space and the flow of contrast was observed. Radiographs were obtained for documentation purposes.    The injectate was administered into the level noted above.   Additional Comments:  The patient tolerated the procedure well Dressing: 2 x 2 sterile gauze and Band-Aid    Post-procedure details: Patient was observed during the procedure. Post-procedure instructions were reviewed.  Patient left the clinic in stable condition.

## 2019-01-28 ENCOUNTER — Encounter (INDEPENDENT_AMBULATORY_CARE_PROVIDER_SITE_OTHER): Payer: Self-pay | Admitting: Physical Medicine and Rehabilitation

## 2019-01-28 NOTE — Progress Notes (Signed)
Claudia Lara - 85 y.o. female MRN [id number]  Date of birth: [birth year]  Office Visit Note: Visit Date: 01/12/2019 PCP: Janith Lima, MD Referred by: Janith Lima, MD  Subjective: Chief Complaint  Patient presents with  . Lower Back - Pain   HPI: Claudia Lara is a 85 y.o. female who comes in today At the request of her primary physician Dr. [name] for evaluation management of low back and buttock pain particular right hip and thigh pain.  We last saw the patient in 2018 and completed facet joint block with some relief of her symptoms although at this point she really could not remember how well she did although it seemed to help for quite some time.  She now reports her current pain is 10 out of 10 with really no help from medications or treatment to date.  She reports worsening with walking and better at rest.  She describes this is intermittent and sharp.  Again gets more pain in the right hip than left but across the buttock region.  CT myelogram was performed since I last saw the patient this was performed last year.  Looking at the notes from her primary care physician Dr. Ronnald Ramp it appears that he recorded that she refused to have an MRI because of implants that she has with total hip and knee replacement.  I tried explained to her today that MRI is a more sensitive test but the myelogram is good but that she can have an MRI with those implants in place that is done routinely.  She is a very stoic individual and I feel like at times does not quite understand everything that we are saying because maybe we hit with too much information about her lumbar spine in the past.  Clearly she could call us back anytime if her pain is getting worse and is not always the fact that we know exactly which injection or procedure to do and if it does not seem to help that is actually a diagnostic piece of information.  She does get worsening with movement and standing and worsening with cold  weather.  Nothing seems to help the pain at this point and she is pretty frustrated.  CT myelogram is reviewed below and reviewed with the patient.  She is followed by Dr. Joni Fears for most for orthopedic complaints although she also sees Dr. Meridee Score for her ankle.  Review of Systems  Constitutional: Negative for chills, fever, malaise/fatigue and weight loss.  HENT: Negative for hearing loss and sinus pain.   Eyes: Negative for blurred vision, double vision and photophobia.  Respiratory: Negative for cough and shortness of breath.   Cardiovascular: Negative for chest pain, palpitations and leg swelling.  Gastrointestinal: Negative for abdominal pain, nausea and vomiting.  Genitourinary: Negative for flank pain.  Musculoskeletal: Positive for back pain and joint pain. Negative for myalgias.  Skin: Negative for itching and rash.  Neurological: Negative for tremors, focal weakness and weakness.  Endo/Heme/Allergies: Negative.   Psychiatric/Behavioral: Negative for depression.  All other systems reviewed and are negative.  Otherwise per HPI.  Assessment & Plan: Visit Diagnoses:  1. Spondylosis without myelopathy or radiculopathy, lumbar region   2. Stenosis of lateral recess of lumbar spine   3. Pain in right hip   4. Chronic bilateral low back pain without sciatica     Plan: Findings:  Chronic history of back and hip and knee pain with chronic orthopedic problems and joint  problems.  Most of these are degenerative in nature.  She has had prior right hip replacement and right knee replacement and her left knee is bothering her quite a bit.  She reports worsening severe 10 out of 10 pain in her lower back and really across the lumbosacral junction.  CT myelogram was performed as noted in the history of present illness.  This really just shows ongoing progression of spondylosis and there is some lateral recess stenosis more new at L3-4.  This could clearly give her stenosis type  symptoms which she does complain of.  She has no radicular symptoms per se no paresthesias.  Nothing down past the thighs.  She has no groin pain.  I do think most of her pain is related to the lumbar spine but there is no severe nerve compression or central stenosis.  There is facet arthropathy.  In the past facet joint injections were performed but historically she just does not really remember if they helped very well or not.  She would be a great candidate for diagnostic medial branch blocks if we could go through the process with her to get her to understand why we would do the diagnostic blocks which need to be done in a double block paradigm do to insurance and then ablation of the joints I think would help her potentially.  I think the best first approach though is to try an epidural injection from an interlaminar standpoint for the lateral recess stenosis to just see how much relief she gets.    Meds & Orders: No orders of the defined types were placed in this encounter.  No orders of the defined types were placed in this encounter.   Follow-up: Return for L4-5 interlaminar epidural steroid injection.   Procedures: No procedures performed  No notes on file   Clinical History: CT LUMBAR MYELOGRAM FINDINGS:  The lumbar spine is imaged from the midbody of T12 through S2-3.  Slight retrolisthesis is present at L2-3. Slight anterolisthesis is present at L4-5. There is no significant interval change.  Atherosclerotic calcifications are present in the aorta without aneurysm. Cholecystectomy is noted. No other discrete solid organ lesions are present. There is no significant adenopathy. Multiple subcentimeter para-aortic nodes are present.  L1-2: A broad-based disc protrusion is present. Moderate facet hypertrophy is noted bilaterally. Mild subarticular narrowing is worse on the left. Mild foraminal narrowing is worse on the left. There is no significant change.  L2-3: A  broad-based disc protrusion is present. Moderate facet hypertrophy and ligamentum flavum thickening is noted. Moderate subarticular narrowing is worse on the right. This has progressed some. Moderate right and mild left foraminal narrowing is present.  L3-4: A broad-based disc protrusion is present. Advanced facet hypertrophy has progressed. This results in moderate right and mild left subarticular narrowing with progression. Moderate right and mild left foraminal narrowing has progressed some as well.  L4-5: A broad-based disc protrusion is present. Advanced facet hypertrophy is noted. Severe right and moderate left foraminal stenosis is present. Progressive moderate foraminal narrowing is again seen bilaterally, right greater than left.  L5-S1: A prominent central disc protrusion is present. The Moderate facet hypertrophy and spurring has progressed. Moderate subarticular narrowing is worse on the left. Moderate foraminal narrowing is also worse on the left without definite change.  IMPRESSION: 1. Progressive multilevel spondylosis of the lumbar spine. 2. The L3-4 disc protrusion is worse with standing. 3. Other disc disease does not significantly change with standing. There is no abnormal  motion with standing. 4.  Aortic Atherosclerosis (ICD10-I70.0). 5. Mild subarticular and foraminal narrowing at L1-2 is stable, worse on the left. 6. Moderate subarticular stenosis at L2-3 has progressed. There is also progressive moderate right and mild left foraminal narrowing at the same level. 7. Moderate right and mild left subarticular and foraminal stenosis at L3-4 has progressed. 8. The severe right and moderate left foraminal stenosis and moderate bilateral foraminal narrowing at L4-5 has progressed. 9. Progressive facet disease L5 to S for leading to moderate subarticular and foraminal stenosis, left greater than right. Subarticular stenosis has progressed. Foraminal narrowing  is stable.   Electronically Signed   By: San Morelle M.D.   On: 12/01/2017 13:23   She reports that she has never smoked. She has never used smokeless tobacco. No results for input(s): HGBA1C, LABURIC in the last 8760 hours.  Objective:  VS:  HT:5\' 3"  (160 cm)   WT:174 lb (78.9 kg)  BMI:30.83    BP:(!) 144/59  HR:75bpm  TEMP: ( )  RESP:  Physical Exam Vitals signs and nursing note reviewed.  Constitutional:      General: She is not in acute distress.    Appearance: Normal appearance. She is well-developed. She is not ill-appearing.  HENT:     Head: Normocephalic and atraumatic.  Eyes:     Conjunctiva/sclera: Conjunctivae normal.     Pupils: Pupils are equal, round, and reactive to light.  Cardiovascular:     Rate and Rhythm: Normal rate.     Pulses: Normal pulses.  Pulmonary:     Effort: Pulmonary effort is normal.  Musculoskeletal:     Right lower leg: No edema.     Left lower leg: No edema.     Comments: Patient ambulates without aid with a slightly forward flexed lumbar spine she does have pain with facet loading and extension of the lumbar spine.  No pain with palpation over the greater trochanters or PSIS although she is somewhat tender over the PSIS in general bilaterally.  No trigger points noted in the quadratus lumborum.  She has good distal strength without clonus.  Skin:    General: Skin is warm and dry.     Findings: No erythema or rash.  Neurological:     General: No focal deficit present.     Mental Status: She is alert and oriented to person, place, and time.     Sensory: No sensory deficit.     Motor: No abnormal muscle tone.     Coordination: Coordination normal.     Gait: Gait abnormal.  Psychiatric:        Mood and Affect: Mood normal.        Behavior: Behavior normal.     Ortho Exam Imaging: No results found.  Past Medical/Family/Surgical/Social History: Medications & Allergies reviewed per EMR, new medications updated. Patient  Active Problem List   Diagnosis Date Noted  . History of total right knee replacement 11/09/2018  . Chronic pain of left knee 11/09/2018  . Impingement syndrome of left ankle 07/13/2018  . Zinc deficiency 10/03/2017  . Primary insomnia 09/30/2017  . Spondylosis, lumbar, with myelopathy 04/09/2017  . Carpal tunnel syndrome, right upper limb 04/09/2017  . GERD (gastroesophageal reflux disease) 08/03/2015  . Hemolytic anemia ([place]) 07/12/2015  . Chronic venous insufficiency 08/10/2014  . Aortic stenosis, mild 06/29/2014  . Left ventricular diastolic dysfunction, NYHA class 1 06/29/2014  . Senile osteopenia 04/21/2014  . Hyperlipidemia with target LDL less than 130 04/21/2014  .  Kidney disease, chronic, stage III (GFR 30-59 ml/min) ([place]) 04/21/2014  . Routine health maintenance 07/16/2012  . Carotid artery stenosis without cerebral infarction 01/16/2010  . Essential hypertension 10/19/2007   Past Medical History:  Diagnosis Date  . ANEMIA 08/22/2009  . CAROTID ARTERY STENOSIS 01/16/2010  . Complication of anesthesia    very hard to wake up from surgery  . DYSPNEA ON EXERTION 11/16/2007  . Gallstones   . GEN OSTEOARTHROSIS INVOLVING MULTIPLE SITES 11/11/2008  . GERD (gastroesophageal reflux disease)   . HEMORRHOIDS, INTERNAL    surgery was in the 90's (per patient)  . NECK PAIN, ACUTE 12/28/2009  . PERIPHERAL EDEMA 10/06/2007  . PVD 10/06/2007  . Unspecified essential hypertension 10/19/2007   Family History  Problem Relation Age of Onset  . Ovarian cancer Mother   . Cancer Mother   . Leukemia Father   . Lung cancer Father   . Cancer Father   . Cancer Brother   . Diabetes Neg Hx   . Heart disease Neg Hx   . Hypertension Neg Hx    Past Surgical History:  Procedure Laterality Date  . ABDOMINAL HYSTERECTOMY    . BUNIONECTOMY     bilat  . CARPAL TUNNEL RELEASE    . CHOLECYSTECTOMY  01/29/2016   at [facility]  . CHOLECYSTECTOMY    . ENDOSCOPIC RETROGRADE  CHOLANGIOPANCREATOGRAPHY (ERCP) WITH PROPOFOL N/A 12/01/2015   Procedure: ENDOSCOPIC RETROGRADE CHOLANGIOPANCREATOGRAPHY (ERCP) WITH PROPOFOL;  Surgeon: [name], MD;  Location: [facility];  Service: Endoscopy;  Laterality: N/A;  . ENDOSCOPIC RETROGRADE CHOLANGIOPANCREATOGRAPHY (ERCP) WITH PROPOFOL N/A 02/01/2016   Procedure: ENDOSCOPIC RETROGRADE CHOLANGIOPANCREATOGRAPHY (ERCP) WITH PROPOFOL;  Surgeon: [name], MD;  Location: [facility];  Service: Endoscopy;  Laterality: N/A;  . ENDOSCOPIC RETROGRADE CHOLANGIOPANCREATOGRAPHY (ERCP) WITH PROPOFOL N/A 04/07/2018   Procedure: ENDOSCOPIC RETROGRADE CHOLANGIOPANCREATOGRAPHY (ERCP) WITH PROPOFOL;  Surgeon: [name], MD;  Location: [facility];  Service: Endoscopy;  Laterality: N/A;  . HAMMER TOE SURGERY    . HEMORRHOID SURGERY    . Hyperplastic colon polyps, removed  2007   By Dr. Penelope Coop  . JOINT REPLACEMENT  2011   right knee  . OOPHORECTOMY    . REMOVAL OF STONES  04/07/2018   Procedure: REMOVAL OF STONES;  Surgeon: [name], MD;  Location: [facility];  Service: Endoscopy;;  . right hip replacement    . ROTATOR CUFF REPAIR  2004   left (Dr. Durward Fortes)  . SHOULDER ARTHROSCOPY    . SPHINCTEROTOMY  04/07/2018   Procedure: SPHINCTEROTOMY;  Surgeon: [name], MD;  Location: [facility];  Service: Endoscopy;;  . TOTAL HIP ARTHROPLASTY Right 11/26/2016   Procedure: RIGHT TOTAL HIP ARTHROPLASTY;  Surgeon: Garald Balding, MD;  Location: [place];  Service: Orthopedics;  Laterality: Right;  . TOTAL KNEE ARTHROPLASTY Right    Social History   Occupational History  . Occupation: Producer, television/film/video: RETIRED    Comment: retired  Tobacco Use  . Smoking status: Never Smoker  . Smokeless tobacco: Never Used  Substance and Sexual Activity  . Alcohol use: No  . Drug use: No  . Sexual activity: Not Currently

## 2019-02-01 ENCOUNTER — Encounter: Payer: Self-pay | Admitting: Internal Medicine

## 2019-02-01 ENCOUNTER — Ambulatory Visit (INDEPENDENT_AMBULATORY_CARE_PROVIDER_SITE_OTHER): Payer: Medicare Other | Admitting: Internal Medicine

## 2019-02-01 ENCOUNTER — Ambulatory Visit (INDEPENDENT_AMBULATORY_CARE_PROVIDER_SITE_OTHER)
Admission: RE | Admit: 2019-02-01 | Discharge: 2019-02-01 | Disposition: A | Discharge status: Home / Self Care | Payer: Medicare Other | Source: Ambulatory Visit | Attending: Internal Medicine | Admitting: Internal Medicine

## 2019-02-01 ENCOUNTER — Other Ambulatory Visit (INDEPENDENT_AMBULATORY_CARE_PROVIDER_SITE_OTHER): Payer: Medicare Other

## 2019-02-01 VITALS — BP 160/50 | HR 85 | Temp 98.0°F | Resp 16 | Ht 63.0 in | Wt 173.5 lb

## 2019-02-01 DIAGNOSIS — N183 Chronic kidney disease, stage 3 unspecified: Secondary | ICD-10-CM

## 2019-02-01 DIAGNOSIS — S4991XA Unspecified injury of right shoulder and upper arm, initial encounter: Secondary | ICD-10-CM

## 2019-02-01 DIAGNOSIS — M79621 Pain in right upper arm: Secondary | ICD-10-CM | POA: Diagnosis not present

## 2019-02-01 DIAGNOSIS — E60 Dietary zinc deficiency: Secondary | ICD-10-CM

## 2019-02-01 DIAGNOSIS — D599 Acquired hemolytic anemia, unspecified: Secondary | ICD-10-CM | POA: Diagnosis not present

## 2019-02-01 LAB — CBC WITH DIFFERENTIAL/PLATELET
Basophils Absolute: 0.1 10*3/uL (ref 0.0–0.1)
Basophils Relative: 0.7 % (ref 0.0–3.0)
Eosinophils Absolute: 0.2 10*3/uL (ref 0.0–0.7)
Eosinophils Relative: 1.6 % (ref 0.0–5.0)
HEMATOCRIT: 30.2 % — AB (ref 36.0–46.0)
Hemoglobin: 10.4 g/dL — ABNORMAL LOW (ref 12.0–15.0)
Lymphocytes Relative: 11 % — ABNORMAL LOW (ref 12.0–46.0)
Lymphs Abs: 1.3 10*3/uL (ref 0.7–4.0)
MCHC: 34.4 g/dL (ref 30.0–36.0)
MCV: 91.8 fl (ref 78.0–100.0)
Monocytes Absolute: 1 10*3/uL (ref 0.1–1.0)
Monocytes Relative: 8.4 % (ref 3.0–12.0)
Neutro Abs: 9.3 10*3/uL — ABNORMAL HIGH (ref 1.4–7.7)
Neutrophils Relative %: 78.3 % — ABNORMAL HIGH (ref 43.0–77.0)
Platelets: 233 10*3/uL (ref 150.0–400.0)
RBC: 3.29 Mil/uL — ABNORMAL LOW (ref 3.87–5.11)
RDW: 20 % — ABNORMAL HIGH (ref 11.5–15.5)
WBC: 11.9 10*3/uL — ABNORMAL HIGH (ref 4.0–10.5)

## 2019-02-01 LAB — BASIC METABOLIC PANEL
BUN: 37 mg/dL — AB (ref 6–23)
CO2: 26 meq/L (ref 19–32)
Calcium: 9.7 mg/dL (ref 8.4–10.5)
Chloride: 108 mEq/L (ref 96–112)
Creatinine, Ser: 1.27 mg/dL — ABNORMAL HIGH (ref 0.40–1.20)
GFR: 40.01 mL/min — ABNORMAL LOW (ref >60.00)
Glucose, Bld: 131 mg/dL — ABNORMAL HIGH (ref 70–99)
Potassium: 4.4 mEq/L (ref 3.5–5.1)
Sodium: 141 mEq/L (ref 135–145)

## 2019-02-01 MED ORDER — ZOSTER VAC RECOMB ADJUVANTED 50 MCG/0.5ML IM SUSR: 0.5000 mL | Freq: Once | INTRAMUSCULAR | 1 refills | Status: AC

## 2019-02-01 NOTE — Patient Instructions (Signed)
Muscle Strain  A muscle strain is an injury that occurs when a muscle is stretched beyond its normal length. Usually, a small number of muscle fibers are torn when this happens. There are three types of muscle strains. First-degree strains have the least amount of muscle fiber tearing and the least amount of pain. Second-degree and third-degree strains have more tearing and pain.  Usually, recovery from muscle strain takes 1-2 weeks. Complete healing normally takes 5-6 weeks.  What are the causes?  This condition is caused when a sudden, violent force is placed on a muscle and stretches it too far. This may occur with a fall, lifting, or sports.  What increases the risk?  This condition is more likely to develop in athletes and people who are physically active.  What are the signs or symptoms?  Symptoms of this condition include:  · Pain.  · Bruising.  · Swelling.  · Trouble using the muscle.  How is this diagnosed?  This condition is diagnosed based on a physical exam and your medical history. Tests may also be done, including an X-ray, ultrasound, or MRI.  How is this treated?  This condition is initially treated with PRICE therapy. This therapy involves:  · Protecting the muscle from being injured again.  · Resting the injured muscle.  · Icing the injured muscle.  · Applying pressure (compression) to the injured muscle. This may be done with a splint or elastic bandage.  · Raising (elevating) the injured muscle.  Your health care provider may also recommend medicine for pain.  Follow these instructions at home:  If you have a splint:  · Wear the splint as told by your health care provider. Remove it only as told by your health care provider.  · Loosen the splint if your fingers or toes tingle, become numb, or turn cold and blue.  · Keep the splint clean.  · If the splint is not waterproof:  ? Do not let it get wet.  ? Cover it with a watertight covering when you take a bath or a shower.  Managing pain, stiffness,  and swelling    · If directed, put ice on the injured area.  ? If you have a removable splint, remove it as told by your health care provider.  ? Put ice in a plastic bag.  ? Place a towel between your skin and the bag.  ? Leave the ice on for 20 minutes, 2-3 times a day.  · Move your fingers or toes often to avoid stiffness and to lessen swelling.  · Raise (elevate) the injured area above the level of your heart while you are sitting or lying down.  · Wear an elastic bandage as told by your health care provider. Make sure that it is not too tight.  General instructions  · Take over-the-counter and prescription medicines only as told by your health care provider.  · Restrict your activity and rest the injured muscle as told by your health care provider. Gentle movements may be allowed.  · If physical therapy was prescribed, do exercises as told by your health care provider.  · Do not put pressure on any part of the splint until it is fully hardened. This may take several hours.  · Do not use any products that contain nicotine or tobacco, such as cigarettes and e-cigarettes. These can delay bone healing. If you need help quitting, ask your health care provider.  · Ask your health care provider when it   is safe to drive if you have a splint.  · Keep all follow-up visits as told by your health care provider. This is important.  How is this prevented?  · Warm up before exercising. This helps to prevent future muscle strains.  Contact a health care provider if:  · You have more pain or swelling in the injured area.  Get help right away if:  · You have numbness or tingling or lose a lot of strength in the injured area.  Summary  · A muscle strain is an injury that occurs when a muscle is stretched beyond its normal length.  · This condition is caused when a sudden, violent force is placed on a muscle and stretches it too far.  · This condition is initially treated with PRICE therapy, which involves protecting, resting,  icing, compressing, and elevating.  · Gentle movements may be allowed. If physical therapy was prescribed, do exercises as told by your health care provider.  This information is not intended to replace advice given to you by your health care provider. Make sure you discuss any questions you have with your health care provider.  Document Released: 11/11/2005 Document Revised: 12/18/2016 Document Reviewed: 12/18/2016  [employer] Interactive Patient Education © 2019 [employer]

## 2019-02-01 NOTE — Progress Notes (Signed)
Subjective:  Patient ID: Claudia Lara, female    DOB: May 27, 1932  Age: 85 y.o. MRN: [id number]  CC: Anemia   HPI Claudia Lara presents for f/up - She complains of a right upper arm injury.  She tells me that 1 day prior to this visit she was working in the yard and she reached over to grab something and since then has had a discomfort from the mid humerus up into the shoulder.  She did not fall and there was no blunt trauma.  She is taking her usual meds for symptom relief.  She says the pain worsens with movement.  Outpatient Medications Prior to Visit  Medication Sig Dispense Refill  . Calcium Carb-Cholecalciferol (CALCIUM CARBONATE-VITAMIN D3 PO) Take by mouth.    . carvedilol (COREG) 12.5 MG tablet TAKE 1 TABLET (12.5 MG TOTAL) BY MOUTH 2 (TWO) TIMES DAILY WITH A MEAL. 180 tablet 1  . cholecalciferol (VITAMIN D) 1000 units tablet Take 1,000 Units by mouth daily.    [name] glucosamine-chondroitin 500-400 MG tablet Take 1 tablet by mouth 3 (three) times daily.    . IRON PO Take 65 mg by mouth daily.     . meloxicam (MOBIC) 7.5 MG tablet TAKE 1 TABLET BY MOUTH DAILY 90 tablet 1  . Multiple Vitamins-Minerals (CENTRUM SILVER PO) Take 1 tablet by mouth daily.     . pantoprazole (PROTONIX) 40 MG tablet TAKE 1 TABLET (40 MG TOTAL) BY MOUTH DAILY. 90 tablet 1  . torsemide (DEMADEX) 10 MG tablet Take 1 tablet (10 mg total) by mouth daily. (Patient taking differently: Take 10 mg by mouth daily as needed. ) 90 tablet 1  . traMADol (ULTRAM) 50 MG tablet TAKE 1 TABLET (50 MG TOTAL) BY MOUTH EVERY 12 (TWELVE) HOURS AS NEEDED. 60 tablet 0  . vitamin C (ASCORBIC ACID) 500 MG tablet Take 500 mg by mouth daily.    [name] zinc gluconate 50 MG tablet Take 1 tablet (50 mg total) daily by mouth. (Patient taking differently: Take 50 mg by mouth daily as needed. ) 90 tablet 1   Facility-Administered Medications Prior to Visit  Medication Dose Route Frequency Provider Last Rate Last Dose  . methylPREDNISolone  acetate (DEPO-MEDROL) injection 80 mg  80 mg Other Once Magnus Sinning, MD        ROS Review of Systems  Constitutional: Negative.  Negative for chills, diaphoresis, fatigue and fever.  HENT: Negative.  Negative for trouble swallowing.   Respiratory: Negative for cough, chest tightness, shortness of breath and wheezing.   Cardiovascular: Negative for chest pain, palpitations and leg swelling.  Gastrointestinal: Negative for abdominal pain, constipation, diarrhea, nausea and vomiting.  Endocrine: Negative.   Genitourinary: Negative.  Negative for decreased urine volume, difficulty urinating, dysuria, hematuria and urgency.  Musculoskeletal: Positive for arthralgias and back pain. Negative for joint swelling, myalgias and neck pain.  Skin: Negative.  Negative for color change and pallor.  Neurological: Negative.  Negative for dizziness, weakness, light-headedness and headaches.  Hematological: Negative for adenopathy. Does not bruise/bleed easily.  Psychiatric/Behavioral: Negative.     Objective:  BP (!) 160/50 (BP Location: Left Arm, Patient Position: Sitting, Cuff Size: Normal)   Pulse 85   Temp 98 F (36.7 C) (Oral)   Resp 16   Ht 5\' 3"  (1.6 m)   Wt 173 lb 8 oz (78.7 kg)   SpO2 95%   BMI 30.73 kg/m   BP Readings from Last 3 Encounters:  02/01/19 (!) 160/50  01/25/19 [name])  150/59  01/12/19 (!) 144/59    Wt Readings from Last 3 Encounters:  02/01/19 173 lb 8 oz (78.7 kg)  01/12/19 174 lb (78.9 kg)  11/09/18 175 lb (79.4 kg)    Physical Exam Vitals signs reviewed.  Constitutional:      Appearance: She is not ill-appearing or diaphoretic.  HENT:     Nose: Nose normal. No congestion or rhinorrhea.     Mouth/Throat:     Mouth: Mucous membranes are moist.     Pharynx: Oropharynx is clear. No oropharyngeal exudate or posterior oropharyngeal erythema.  Eyes:     Conjunctiva/sclera: Conjunctivae normal.  Neck:     Musculoskeletal: Normal range of motion and neck supple.  No muscular tenderness.  Cardiovascular:     Rate and Rhythm: Normal rate and regular rhythm.     Heart sounds: Murmur present. Systolic murmur present with a grade of 1/6. No gallop.   Pulmonary:     Effort: Pulmonary effort is normal.     Breath sounds: No stridor. No wheezing, rhonchi or rales.  Abdominal:     Palpations: There is no hepatomegaly, splenomegaly or mass.     Tenderness: There is no abdominal tenderness. There is no guarding.  Musculoskeletal:        General: No swelling or deformity.     Right shoulder: She exhibits decreased range of motion. She exhibits no tenderness, no bony tenderness, no swelling, no deformity and no spasm.     Right upper arm: She exhibits tenderness. She exhibits no bony tenderness, no swelling, no edema and no deformity.       Arms:     Right lower leg: No edema.     Left lower leg: No edema.  Lymphadenopathy:     Cervical: No cervical adenopathy.  Skin:    General: Skin is warm and dry.  Neurological:     General: No focal deficit present.     Mental Status: She is oriented to person, place, and time. Mental status is at baseline.     Lab Results  Component Value Date   WBC 11.9 (H) 02/01/2019   HGB 10.4 (L) 02/01/2019   HCT 30.2 (L) 02/01/2019   PLT 233.0 02/01/2019   GLUCOSE 131 (H) 02/01/2019   CHOL 119 07/08/2018   TRIG 208.0 (H) 07/08/2018   HDL 53.70 07/08/2018   LDLDIRECT 56.0 07/08/2018   LDLCALC 45 08/14/2017   ALT 12 07/08/2018   AST 15 07/08/2018   NA 141 02/01/2019   K 4.4 02/01/2019   CL 108 02/01/2019   CREATININE 1.27 (H) 02/01/2019   BUN 37 (H) 02/01/2019   CO2 26 02/01/2019   TSH 1.10 07/08/2018   INR 1.66 12/02/2016   HGBA1C 5.2 03/05/2016    Ct Chest High Resolution  Result Date: 05/08/2018 CLINICAL DATA:  85 year old female with shortness of breath with exertion. Nonsmoker. EXAM: CT CHEST WITHOUT CONTRAST TECHNIQUE: Multidetector CT imaging of the chest was performed following the standard protocol  without intravenous contrast. High resolution imaging of the lungs, as well as inspiratory and expiratory imaging, was performed. COMPARISON:  Chest CT 06/19/2006. FINDINGS: Cardiovascular: Heart size is normal. There is no significant pericardial fluid, thickening or pericardial calcification. There is aortic atherosclerosis, as well as atherosclerosis of the great vessels of the mediastinum and the coronary arteries, including calcified atherosclerotic plaque in the left main, left anterior descending, left circumflex and right coronary arteries. Mediastinum/Nodes: No pathologically enlarged mediastinal or hilar lymph nodes. Please note that accurate  exclusion of hilar adenopathy is limited on noncontrast CT scans. Moderate-sized hiatal hernia. No axillary lymphadenopathy. Lungs/Pleura: Scattered throughout the lungs bilaterally there are innumerable tiny 1-2 mm pulmonary nodules in the periphery, most compatible with benign areas of mucoid impaction within terminal bronchioles. In addition, in the inferior aspect of the right upper lobe (axial image 70 of series 8) there is a 7 mm nodule which is smoothly marginated, but is new compared to prior chest CT from 06/19/2006. No acute consolidative airspace disease. No pleural effusions. High-resolution images demonstrate minimal bibasilar ground-glass attenuation and septal thickening. No subpleural reticulation, traction bronchiectasis or frank honeycombing. Inspiratory and expiratory imaging demonstrates some very mild air trapping indicative of mild small airways disease. Upper Abdomen: Status post cholecystectomy. Small amount of pneumobilia related to prior sphincterotomy. Aortic atherosclerosis. Musculoskeletal: There are no aggressive appearing lytic or blastic lesions noted in the visualized portions of the skeleton. IMPRESSION: 1. Very subtle changes in the lung bases bilaterally which could be indicative of interstitial lung disease. At this time, the  pattern is highly nonspecific, a CT pattern considered indeterminate for usual interstitial pneumonia (UIP). Repeat high-resolution chest CT is suggested in 12 months if there is persistent clinical concern for interstitial lung disease. 2. New 7 mm nodule in the inferior aspect of the right upper lobe. Attention at time of repeat high-resolution chest CT in 12 months is recommended. If the nodule is stable at time of repeat CT, then future CT at 18-24 months (from today's scan) is considered optional for low-risk patients, but is recommended for high-risk patients. This recommendation follows the consensus statement: Guidelines for Management of Incidental Pulmonary Nodules Detected on CT Images: From the Fleischner Society 2017; Radiology 2017; 284:228-243. 3. Aortic atherosclerosis, in addition to left main and 3 vessel coronary artery disease. Aortic Atherosclerosis (ICD10-I70.0). Electronically Signed   By: Vinnie Langton M.D.   On: 05/08/2018 12:32   Dg Humerus Right  Result Date: 02/02/2019 CLINICAL DATA:  Injury to right upper arm.  Pain. EXAM: RIGHT HUMERUS - 2+ VIEW COMPARISON:  None. FINDINGS: Enthesopathic changes are associated with the internal and external humeral epicondyles. No fractures are seen. IMPRESSION: No fractures or dislocations. Electronically Signed   By: Dorise Bullion III M.D   On: 02/02/2019 08:41     Assessment & Plan:   Jennet was seen today for anemia.  Diagnoses and all orders for this visit:  Kidney disease, chronic, stage III (GFR 30-59 ml/min) ([place])- Her renal function is stable.  I have asked her to avoid nephrotoxic agents. -     Basic metabolic panel; Future  Acquired hemolytic anemia ([place])- Her H&H have improved slightly and she is asymptomatic.  Will continue to monitor this.  No treatment is indicated at this time. -     CBC with Differential/Platelet; Future  Zinc deficiency  Injury of right upper arm, initial encounter- Plain x-rays are negative  for fracture.  Her symptoms and exam are consistent with a musculoskeletal strain.  I have asked her to apply warm compresses and to continue tramadol as needed for the discomfort. -     DG Humerus Right; Future  Other orders -     Zoster Vaccine Adjuvanted [facility]) injection; Inject 0.5 mLs into the muscle once for 1 dose.   I am having Jennette Banker start on Zoster Vaccine Adjuvanted. I am also having her maintain her vitamin C, cholecalciferol, Multiple Vitamins-Minerals (CENTRUM SILVER PO), IRON PO, zinc gluconate, torsemide, meloxicam, Calcium Carb-Cholecalciferol (CALCIUM CARBONATE-VITAMIN  D3 PO), pantoprazole, carvedilol, traMADol, and glucosamine-chondroitin. We will continue to administer methylPREDNISolone acetate.  Meds ordered this encounter  Medications  . Zoster Vaccine Adjuvanted [facility]) injection    Sig: Inject 0.5 mLs into the muscle once for 1 dose.    Dispense:  0.5 mL    Refill:  1     Follow-up: Return in about 4 months (around 06/03/2019).  [name], MD

## 2019-02-02 ENCOUNTER — Encounter: Payer: Self-pay | Admitting: Internal Medicine

## 2019-02-17 ENCOUNTER — Telehealth: Payer: Self-pay | Admitting: Internal Medicine

## 2019-02-17 NOTE — Telephone Encounter (Signed)
Pt contacted  HR 80s OK   Breathing is stable   No problem Problem with knee arthritis  Due to corona virus outbreak would reschedule to June/July

## 2019-02-18 DIAGNOSIS — M1712 Unilateral primary osteoarthritis, left knee: Secondary | ICD-10-CM | POA: Diagnosis not present

## 2019-02-18 NOTE — Telephone Encounter (Signed)
Cancelled upcoming appointment, will send to C19 r/s pool to plan follow up 

## 2019-02-22 ENCOUNTER — Ambulatory Visit: Payer: Medicare Other | Admitting: Internal Medicine

## 2019-02-25 DIAGNOSIS — M1711 Unilateral primary osteoarthritis, right knee: Secondary | ICD-10-CM | POA: Diagnosis not present

## 2019-03-04 DIAGNOSIS — M1712 Unilateral primary osteoarthritis, left knee: Secondary | ICD-10-CM | POA: Diagnosis not present

## 2019-03-07 ENCOUNTER — Other Ambulatory Visit: Payer: Self-pay | Admitting: Internal Medicine

## 2019-03-07 DIAGNOSIS — M4716 Other spondylosis with myelopathy, lumbar region: Secondary | ICD-10-CM

## 2019-03-08 NOTE — Telephone Encounter (Signed)
Per database last refill of tramadol was 01/24/2019  LOV was 02/01/2019

## 2019-03-17 DIAGNOSIS — L821 Other seborrheic keratosis: Secondary | ICD-10-CM | POA: Diagnosis not present

## 2019-03-17 DIAGNOSIS — B078 Other viral warts: Secondary | ICD-10-CM | POA: Diagnosis not present

## 2019-03-25 DIAGNOSIS — M1712 Unilateral primary osteoarthritis, left knee: Secondary | ICD-10-CM | POA: Diagnosis not present

## 2019-04-14 DIAGNOSIS — M542 Cervicalgia: Secondary | ICD-10-CM | POA: Diagnosis not present

## 2019-04-14 DIAGNOSIS — M25511 Pain in right shoulder: Secondary | ICD-10-CM | POA: Diagnosis not present

## 2019-04-16 ENCOUNTER — Other Ambulatory Visit: Payer: Self-pay | Admitting: Internal Medicine

## 2019-04-16 DIAGNOSIS — M5030 Other cervical disc degeneration, unspecified cervical region: Secondary | ICD-10-CM | POA: Diagnosis not present

## 2019-04-16 DIAGNOSIS — M159 Polyosteoarthritis, unspecified: Secondary | ICD-10-CM

## 2019-04-16 DIAGNOSIS — M542 Cervicalgia: Secondary | ICD-10-CM | POA: Diagnosis not present

## 2019-04-16 DIAGNOSIS — G542 Cervical root disorders, not elsewhere classified: Secondary | ICD-10-CM | POA: Diagnosis not present

## 2019-04-21 ENCOUNTER — Encounter: Payer: Self-pay | Admitting: Physical Therapy

## 2019-04-21 ENCOUNTER — Other Ambulatory Visit: Payer: Self-pay

## 2019-04-21 ENCOUNTER — Ambulatory Visit: Payer: Medicare Other | Attending: Orthopedic Surgery | Admitting: Physical Therapy

## 2019-04-21 DIAGNOSIS — M25611 Stiffness of right shoulder, not elsewhere classified: Secondary | ICD-10-CM | POA: Diagnosis not present

## 2019-04-21 DIAGNOSIS — M542 Cervicalgia: Secondary | ICD-10-CM | POA: Insufficient documentation

## 2019-04-21 DIAGNOSIS — M25511 Pain in right shoulder: Secondary | ICD-10-CM | POA: Diagnosis not present

## 2019-04-21 NOTE — Therapy (Signed)
[address] [place], [place], [address] Phone: [phone]   Fax:  [phone]  Physical Therapy Evaluation  Patient Details  Name: Claudia Lara MRN: [id number] Date of Birth: 1932/04/21 Referring Provider (PT): D. Rolena Infante   Encounter Date: 04/21/2019  PT End of Session - 04/21/19 1215    Visit Number  1    Date for PT Re-Evaluation  06/21/19    Authorization Type  MCR and KX at visit 18    PT Start Time  1150    PT Stop Time  1245    PT Time Calculation (min)  55 min    Activity Tolerance  Patient tolerated treatment well    Behavior During Therapy  WFL for tasks assessed/performed       Past Medical History:  Diagnosis Date  . ANEMIA 08/22/2009  . CAROTID ARTERY STENOSIS 01/16/2010  . Complication of anesthesia    very hard to wake up from surgery  . DYSPNEA ON EXERTION 11/16/2007  . Gallstones   . GEN OSTEOARTHROSIS INVOLVING MULTIPLE SITES 11/11/2008  . GERD (gastroesophageal reflux disease)   . HEMORRHOIDS, INTERNAL    surgery was in the 90's (per patient)  . NECK PAIN, ACUTE 12/28/2009  . PERIPHERAL EDEMA 10/06/2007  . PVD 10/06/2007  . Unspecified essential hypertension 10/19/2007    Past Surgical History:  Procedure Laterality Date  . ABDOMINAL HYSTERECTOMY    . BUNIONECTOMY     bilat  . CARPAL TUNNEL RELEASE    . CHOLECYSTECTOMY  01/29/2016   at [facility]  . CHOLECYSTECTOMY    . ENDOSCOPIC RETROGRADE CHOLANGIOPANCREATOGRAPHY (ERCP) WITH PROPOFOL N/A 12/01/2015   Procedure: ENDOSCOPIC RETROGRADE CHOLANGIOPANCREATOGRAPHY (ERCP) WITH PROPOFOL;  Surgeon: [name], MD;  Location: [facility];  Service: Endoscopy;  Laterality: N/A;  . ENDOSCOPIC RETROGRADE CHOLANGIOPANCREATOGRAPHY (ERCP) WITH PROPOFOL N/A 02/01/2016   Procedure: ENDOSCOPIC RETROGRADE CHOLANGIOPANCREATOGRAPHY (ERCP) WITH PROPOFOL;  Surgeon: [name], MD;  Location: [facility];  Service:  Endoscopy;  Laterality: N/A;  . ENDOSCOPIC RETROGRADE CHOLANGIOPANCREATOGRAPHY (ERCP) WITH PROPOFOL N/A 04/07/2018   Procedure: ENDOSCOPIC RETROGRADE CHOLANGIOPANCREATOGRAPHY (ERCP) WITH PROPOFOL;  Surgeon: [name], MD;  Location: [facility];  Service: Endoscopy;  Laterality: N/A;  . HAMMER TOE SURGERY    . HEMORRHOID SURGERY    . Hyperplastic colon polyps, removed  2007   By Dr. Penelope Coop  . JOINT REPLACEMENT  2011   right knee  . OOPHORECTOMY    . REMOVAL OF STONES  04/07/2018   Procedure: REMOVAL OF STONES;  Surgeon: [name], MD;  Location: [facility];  Service: Endoscopy;;  . right hip replacement    . ROTATOR CUFF REPAIR  2004   left (Dr. Durward Fortes)  . SHOULDER ARTHROSCOPY    . SPHINCTEROTOMY  04/07/2018   Procedure: SPHINCTEROTOMY;  Surgeon: [name], MD;  Location: [facility];  Service: Endoscopy;;  . TOTAL HIP ARTHROPLASTY Right 11/26/2016   Procedure: RIGHT TOTAL HIP ARTHROPLASTY;  Surgeon: Garald Balding, MD;  Location: [facility];  Service: Orthopedics;  Laterality: Right;  . TOTAL KNEE ARTHROPLASTY Right     There were no vitals filed for this visit.   Subjective Assessment - 04/21/19 1146    Subjective  Patient reports that in MArch she started having right shoulder and arm pain.  She reports that she was reaching do Solicitor out of way and it caused the right shoulder pain, she reports that over the past 2  months she started having more shoulder and neck pain.  She reports having a cortisone injection in the right shoulder.  She reports that it helped for two days and then the pain returned.,  She reports that she has significant arthritis.    Limitations  House hold activities    Patient Stated Goals  have less right shoulder and neck pain    Currently in Pain?  Yes    Pain Score  5     Pain Location  Neck    Pain Orientation  Right    Pain Descriptors / Indicators  Aching    Pain Type  Acute pain    Pain Radiating Towards  has pain in  the right lateral upper arm    Pain Onset  More than a month ago    Pain Frequency  Constant    Aggravating Factors   reaching, doing hair, dressing, lifting pain i sup to 8/10    Pain Relieving Factors  rest, helps some at best pain is a 3/10    Effect of Pain on Daily Activities  difficulty with hair, dressing and other ADL's         [facility] PT Assessment - 04/21/19 0001      Assessment   Medical Diagnosis  neck pain and shoulder     Referring Provider (PT)  D. Brooks    Onset Date/Surgical Date  04/14/19    Prior Therapy  years ago      Precautions   Precautions  None      Balance Screen   Has the patient fallen in the past 6 months  No    Has the patient had a decrease in activity level because of a fear of falling?   No    Is the patient reluctant to leave their home because of a fear of falling?   No      Home Environment   Additional Comments  has a few steps into the home, she does the yardwork, does her own housework      Prior Function   Level of Independence  Independent    Vocation  Retired    Leisure  no exercise      Mining engineer Comments  fwd head, rounded shoulders      ROM / Strength   AROM / PROM / Strength  AROM;Strength      AROM   Overall AROM Comments  Cervical ROM is decreaed 75% for all motions with c/o pain and stiffness.  All Right shoulder motions increased pain in the anterior shoulder    AROM Assessment Site  Shoulder    Right/Left Shoulder  Right    Right Shoulder Flexion  115 Degrees    Right Shoulder ABduction  115 Degrees    Right Shoulder Internal Rotation  5 Degrees    Right Shoulder External Rotation  80 Degrees      Strength   Overall Strength Comments  right elbow 4/5, right shoulder ER 3/5 with pain, IR 4/5      Palpation   Palpation comment  she is very tight and tender in the cervical parapsinals, she is tight in the upper traps, she is tender in the anterior and latera right shoulder and upper arm       Special Tests   Other special tests  sh ehas a positive empty can and impingement test  Objective measurements completed on examination: See above findings.      [facility] Adult PT Treatment/Exercise - 04/21/19 0001      Modalities   Modalities  Electrical Stimulation;Iontophoresis;Moist Heat      Moist Heat Therapy   Number Minutes Moist Heat  15 Minutes    Moist Heat Location  Cervical;Shoulder      Electrical Stimulation   Electrical Stimulation Location  cervical and into the right shoulder    Electrical Stimulation Action  IFC    Electrical Stimulation Parameters  supine    Electrical Stimulation Goals  Pain      Iontophoresis   Type of Iontophoresis  Dexamethasone    Location  right anterior/lateral shoulder    Dose  36mA     Time  4 hour patch             PT Education - 04/21/19 1214    Education Details  cervical and scapula rretraction, shoulder shrugs, gentle upper trap and levator stretch    Person(s) Educated  Patient    Methods  Explanation;Demonstration;Tactile cues;Verbal cues;Handout    Comprehension  Verbalized understanding       PT Short Term Goals - 04/21/19 1220      PT SHORT TERM GOAL #1   Title  independent with initial HEP    Time  2    Period  Weeks    Status  New        PT Long Term Goals - 04/21/19 1220      PT LONG TERM GOAL #1   Title  report able to do hair wihtout pain >3/10    Time  8    Period  Weeks    Status  New      PT LONG TERM GOAL #2   Title  report able to dress wihtout pain >3/10    Time  8    Period  Weeks    Status  New      PT LONG TERM GOAL #3   Title  increase strength of the right shoulder to 4/5    Time  8    Period  Weeks    Status  New      PT LONG TERM GOAL #4   Title  increase cervical ROM 25%    Time  8    Period  Weeks    Status  New      PT LONG TERM GOAL #5   Title  decrease pain overall 50%    Time  8    Period  Weeks    Status  New              Plan - 04/21/19 1216    Clinical Impression Statement  Patient reports that in March she was reaching and lifting the back of her lawnmower and had pain in the right shoulder, she reports that she had a cortisone injection that helped for 2 days but the pain returned.  She has significant neck pain over the past month, has DDD and arthritis.  Her cervical ROM is decreased 75%, her right shoulder ROM is limited with IR having only 5 degrees, she had a positive empty can and impingement test of the right shoulder.    Personal Factors and Comorbidities  Age;Comorbidity 3+    Comorbidities  GERD, anemia, THR, TKR, RC repair, SOB    Examination-Activity Limitations  Carry;Lift;Reach Overhead    Examination-Participation Restrictions  Cleaning;Yard Work;Driving    Stability/Clinical Decision  Making  Evolving/Moderate complexity    Clinical Decision Making  Moderate    Rehab Potential  Good    PT Frequency  2x / week    PT Duration  8 weeks    PT Treatment/Interventions  ADLs/Self Care Home Management;Electrical Stimulation;Iontophoresis 4mg /ml Dexamethasone;Cryotherapy;Moist Heat;Ultrasound;Traction;Therapeutic activities;Therapeutic exercise;Patient/family education;Manual techniques    PT Next Visit Plan  slowly try exercises and work to help the neck spasms but also continiue to assess shoulder for RC issues    Consulted and Agree with Plan of Care  Patient       Patient will benefit from skilled therapeutic intervention in order to improve the following deficits and impairments:  Increased muscle spasms, Improper body mechanics, Decreased range of motion, Decreased strength, Impaired UE functional use, Postural dysfunction, Pain  Visit Diagnosis: Cervicalgia - Plan: PT plan of care cert/re-cert  Acute pain of right shoulder - Plan: PT plan of care cert/re-cert  Stiffness of right shoulder, not elsewhere classified - Plan: PT plan of care cert/re-cert     Problem  List Patient Active Problem List   Diagnosis Date Noted  . Injury of right upper arm 02/01/2019  . Chronic pain of left knee 11/09/2018  . Impingement syndrome of left ankle 07/13/2018  . Zinc deficiency 10/03/2017  . Primary insomnia 09/30/2017  . Spondylosis, lumbar, with myelopathy 04/09/2017  . Carpal tunnel syndrome, right upper limb 04/09/2017  . GERD (gastroesophageal reflux disease) 08/03/2015  . Hemolytic anemia ([place]) 07/12/2015  . Chronic venous insufficiency 08/10/2014  . Aortic stenosis, mild 06/29/2014  . Left ventricular diastolic dysfunction, NYHA class 1 06/29/2014  . Senile osteopenia 04/21/2014  . Hyperlipidemia with target LDL less than 130 04/21/2014  . Kidney disease, chronic, stage III (GFR 30-59 ml/min) ([place]) 04/21/2014  . Routine health maintenance 07/16/2012  . Carotid artery stenosis without cerebral infarction 01/16/2010  . Essential hypertension 10/19/2007    Sumner Boast., PT 04/21/2019, 12:24 PM  [address] [place], [place], [address] Phone: [phone]   Fax:  [phone]  Name: JEMEKA WAGLER MRN: [id number] Date of Birth: 1932/06/22

## 2019-04-27 ENCOUNTER — Encounter: Payer: Self-pay | Admitting: Physical Therapy

## 2019-04-27 ENCOUNTER — Other Ambulatory Visit: Payer: Self-pay

## 2019-04-27 ENCOUNTER — Ambulatory Visit: Payer: Medicare Other | Attending: Orthopedic Surgery | Admitting: Physical Therapy

## 2019-04-27 DIAGNOSIS — M25611 Stiffness of right shoulder, not elsewhere classified: Secondary | ICD-10-CM | POA: Diagnosis not present

## 2019-04-27 DIAGNOSIS — M25562 Pain in left knee: Secondary | ICD-10-CM | POA: Insufficient documentation

## 2019-04-27 DIAGNOSIS — M25511 Pain in right shoulder: Secondary | ICD-10-CM | POA: Diagnosis not present

## 2019-04-27 DIAGNOSIS — M542 Cervicalgia: Secondary | ICD-10-CM | POA: Diagnosis not present

## 2019-04-27 NOTE — Therapy (Signed)
[address] [place], [place], [address] Phone: [phone]   Fax:  [phone]  Physical Therapy Treatment  Patient Details  Name: Claudia Lara MRN: [id number] Date of Birth: 07/08/1932 Referring Provider (PT): D. Rolena Infante   Encounter Date: 04/27/2019  PT End of Session - 04/27/19 1413    Visit Number  2    Date for PT Re-Evaluation  06/21/19    PT Start Time  1353    PT Stop Time  1443    PT Time Calculation (min)  50 min    Activity Tolerance  Patient tolerated treatment well    Behavior During Therapy  [facility] for tasks assessed/performed       Past Medical History:  Diagnosis Date  . ANEMIA 08/22/2009  . CAROTID ARTERY STENOSIS 01/16/2010  . Complication of anesthesia    very hard to wake up from surgery  . DYSPNEA ON EXERTION 11/16/2007  . Gallstones   . GEN OSTEOARTHROSIS INVOLVING MULTIPLE SITES 11/11/2008  . GERD (gastroesophageal reflux disease)   . HEMORRHOIDS, INTERNAL    surgery was in the 90's (per patient)  . NECK PAIN, ACUTE 12/28/2009  . PERIPHERAL EDEMA 10/06/2007  . PVD 10/06/2007  . Unspecified essential hypertension 10/19/2007    Past Surgical History:  Procedure Laterality Date  . ABDOMINAL HYSTERECTOMY    . BUNIONECTOMY     bilat  . CARPAL TUNNEL RELEASE    . CHOLECYSTECTOMY  01/29/2016   at [facility]  . CHOLECYSTECTOMY    . ENDOSCOPIC RETROGRADE CHOLANGIOPANCREATOGRAPHY (ERCP) WITH PROPOFOL N/A 12/01/2015   Procedure: ENDOSCOPIC RETROGRADE CHOLANGIOPANCREATOGRAPHY (ERCP) WITH PROPOFOL;  Surgeon: [name], MD;  [facility];  Service: Endoscopy;  Laterality: N/A;  . ENDOSCOPIC RETROGRADE CHOLANGIOPANCREATOGRAPHY (ERCP) WITH PROPOFOL N/A 02/01/2016   Procedure: ENDOSCOPIC RETROGRADE CHOLANGIOPANCREATOGRAPHY (ERCP) WITH PROPOFOL;  Surgeon: [name], MD;  [facility];  Service: Endoscopy;  Laterality: N/A;  . ENDOSCOPIC RETROGRADE  CHOLANGIOPANCREATOGRAPHY (ERCP) WITH PROPOFOL N/A 04/07/2018   Procedure: ENDOSCOPIC RETROGRADE CHOLANGIOPANCREATOGRAPHY (ERCP) WITH PROPOFOL;  Surgeon: [name], MD;  [facility];  Service: Endoscopy;  Laterality: N/A;  . HAMMER TOE SURGERY    . HEMORRHOID SURGERY    . Hyperplastic colon polyps, removed  2007   By Dr. Penelope Coop  . JOINT REPLACEMENT  2011   right knee  . OOPHORECTOMY    . REMOVAL OF STONES  04/07/2018   Procedure: REMOVAL OF STONES;  Surgeon: [name], MD;  [facility];  Service: Endoscopy;;  . right hip replacement    . ROTATOR CUFF REPAIR  2004   left (Dr. Durward Fortes)  . SHOULDER ARTHROSCOPY    . SPHINCTEROTOMY  04/07/2018   Procedure: SPHINCTEROTOMY;  Surgeon: [name], MD;  Location: [facility];  Service: Endoscopy;;  . TOTAL HIP ARTHROPLASTY Right 11/26/2016   Procedure: RIGHT TOTAL HIP ARTHROPLASTY;  Surgeon: Garald Balding, MD;  Location: [place];  Service: Orthopedics;  Laterality: Right;  . TOTAL KNEE ARTHROPLASTY Right     There were no vitals filed for this visit.  Subjective Assessment - 04/27/19 1407    Subjective  Patient reports that over the past day she has had some increased numbness and tingling in her right arm.  She also reports doing some yarwork and now having left sciatica type pain    Currently in Pain?  Yes    Pain Score  7     Pain Location  Back   neck pain a 5/10   Pain Orientation  Left;Lower                       [facility] Adult PT Treatment/Exercise - 04/27/19 0001      Modalities   Modalities  Traction      Moist Heat Therapy   Number Minutes Moist Heat  15 Minutes    Moist Heat Location  Cervical;Lumbar Spine      Electrical Stimulation   Electrical Stimulation Location  cervical    Electrical Stimulation Action  IFC    Electrical Stimulation Parameters  supine    Electrical Stimulation Goals  Pain      Traction   Type of Traction  Cervical    Max (lbs)  10    Rest  Time  static    Time  12       Manual Therapy   Manual Therapy  Passive ROM;Neural Stretch    Passive ROM  Passive stretch of the HS and piriformis    Neural Stretch  right UE and left LE               PT Short Term Goals - 04/21/19 1220      PT SHORT TERM GOAL #1   Title  independent with initial HEP    Time  2    Period  Weeks    Status  New        PT Long Term Goals - 04/21/19 1220      PT LONG TERM GOAL #1   Title  report able to do hair wihtout pain >3/10    Time  8    Period  Weeks    Status  New      PT LONG TERM GOAL #2   Title  report able to dress wihtout pain >3/10    Time  8    Period  Weeks    Status  New      PT LONG TERM GOAL #3   Title  increase strength of the right shoulder to 4/5    Time  8    Period  Weeks    Status  New      PT LONG TERM GOAL #4   Title  increase cervical ROM 25%    Time  8    Period  Weeks    Status  New      PT LONG TERM GOAL #5   Title  decrease pain overall 50%    Time  8    Period  Weeks    Status  New            Plan - 04/27/19 1414    Clinical Impression Statement  Patient walking with a cane today due to increased left sciatic pain from gardening during the weekend.  She is very stiff and tight in the UE and LE's    PT Next Visit Plan  see if the traction helped the pain and numbness    Consulted and Agree with Plan of Care  Patient       Patient will benefit from skilled therapeutic intervention in order to improve the following deficits and impairments:  Increased muscle spasms, Improper body mechanics, Decreased range of motion, Decreased strength, Impaired UE functional use, Postural dysfunction, Pain  Visit Diagnosis: Cervicalgia  Acute pain of right shoulder  Stiffness of right shoulder, not elsewhere classified     Problem List Patient  Active Problem List   Diagnosis Date Noted  . Injury of right upper arm 02/01/2019  . Chronic pain of left knee 11/09/2018  . Impingement  syndrome of left ankle 07/13/2018  . Zinc deficiency 10/03/2017  . Primary insomnia 09/30/2017  . Spondylosis, lumbar, with myelopathy 04/09/2017  . Carpal tunnel syndrome, right upper limb 04/09/2017  . GERD (gastroesophageal reflux disease) 08/03/2015  . Hemolytic anemia ([place]) 07/12/2015  . Chronic venous insufficiency 08/10/2014  . Aortic stenosis, mild 06/29/2014  . Left ventricular diastolic dysfunction, NYHA class 1 06/29/2014  . Senile osteopenia 04/21/2014  . Hyperlipidemia with target LDL less than 130 04/21/2014  . Kidney disease, chronic, stage III (GFR 30-59 ml/min) ([place]) 04/21/2014  . Routine health maintenance 07/16/2012  . Carotid artery stenosis without cerebral infarction 01/16/2010  . Essential hypertension 10/19/2007    Sumner Boast., PT 04/27/2019, 2:16 PM  [address] [place], [place], [address] Phone: [phone]   Fax:  [phone]  Name: JATON EILERS MRN: [id number] Date of Birth: 17-Sep-1932

## 2019-04-30 ENCOUNTER — Other Ambulatory Visit: Payer: Self-pay

## 2019-04-30 ENCOUNTER — Encounter: Payer: Self-pay | Admitting: Physical Therapy

## 2019-04-30 ENCOUNTER — Ambulatory Visit: Payer: Medicare Other | Admitting: Physical Therapy

## 2019-04-30 DIAGNOSIS — M542 Cervicalgia: Secondary | ICD-10-CM | POA: Diagnosis not present

## 2019-04-30 DIAGNOSIS — M25511 Pain in right shoulder: Secondary | ICD-10-CM | POA: Diagnosis not present

## 2019-04-30 DIAGNOSIS — M25611 Stiffness of right shoulder, not elsewhere classified: Secondary | ICD-10-CM | POA: Diagnosis not present

## 2019-04-30 DIAGNOSIS — M25562 Pain in left knee: Secondary | ICD-10-CM | POA: Diagnosis not present

## 2019-04-30 NOTE — Therapy (Signed)
[address] [place], [place], [address] Phone: [phone]   Fax:  [phone]  Physical Therapy Treatment  Patient Details  Name: Claudia Lara MRN: [id number] Date of Birth: 1932-01-25 Referring Provider (PT): D. Rolena Infante   Encounter Date: 04/30/2019  PT End of Session - 04/30/19 1043    Visit Number  3    Date for PT Re-Evaluation  06/21/19    Authorization Type  MCR and KX at visit 18    PT Start Time  1005    PT Stop Time  1055    PT Time Calculation (min)  50 min    Activity Tolerance  Patient tolerated treatment well    Behavior During Therapy  [facility] for tasks assessed/performed       Past Medical History:  Diagnosis Date  . ANEMIA 08/22/2009  . CAROTID ARTERY STENOSIS 01/16/2010  . Complication of anesthesia    very hard to wake up from surgery  . DYSPNEA ON EXERTION 11/16/2007  . Gallstones   . GEN OSTEOARTHROSIS INVOLVING MULTIPLE SITES 11/11/2008  . GERD (gastroesophageal reflux disease)   . HEMORRHOIDS, INTERNAL    surgery was in the 90's (per patient)  . NECK PAIN, ACUTE 12/28/2009  . PERIPHERAL EDEMA 10/06/2007  . PVD 10/06/2007  . Unspecified essential hypertension 10/19/2007    Past Surgical History:  Procedure Laterality Date  . ABDOMINAL HYSTERECTOMY    . BUNIONECTOMY     bilat  . CARPAL TUNNEL RELEASE    . CHOLECYSTECTOMY  01/29/2016   at [facility]  . CHOLECYSTECTOMY    . ENDOSCOPIC RETROGRADE CHOLANGIOPANCREATOGRAPHY (ERCP) WITH PROPOFOL N/A 12/01/2015   Procedure: ENDOSCOPIC RETROGRADE CHOLANGIOPANCREATOGRAPHY (ERCP) WITH PROPOFOL;  Surgeon: [name], MD;  [facility];  Service: Endoscopy;  Laterality: N/A;  . ENDOSCOPIC RETROGRADE CHOLANGIOPANCREATOGRAPHY (ERCP) WITH PROPOFOL N/A 02/01/2016   Procedure: ENDOSCOPIC RETROGRADE CHOLANGIOPANCREATOGRAPHY (ERCP) WITH PROPOFOL;  Surgeon: [name], MD;  [facility];  Service:  Endoscopy;  Laterality: N/A;  . ENDOSCOPIC RETROGRADE CHOLANGIOPANCREATOGRAPHY (ERCP) WITH PROPOFOL N/A 04/07/2018   Procedure: ENDOSCOPIC RETROGRADE CHOLANGIOPANCREATOGRAPHY (ERCP) WITH PROPOFOL;  Surgeon: [name], MD;  [facility];  Service: Endoscopy;  Laterality: N/A;  . HAMMER TOE SURGERY    . HEMORRHOID SURGERY    . Hyperplastic colon polyps, removed  2007   By Dr. Penelope Coop  . JOINT REPLACEMENT  2011   right knee  . OOPHORECTOMY    . REMOVAL OF STONES  04/07/2018   Procedure: REMOVAL OF STONES;  Surgeon: [name], MD;  [facility];  Service: Endoscopy;;  . right hip replacement    . ROTATOR CUFF REPAIR  2004   left (Dr. Durward Fortes)  . SHOULDER ARTHROSCOPY    . SPHINCTEROTOMY  04/07/2018   Procedure: SPHINCTEROTOMY;  Surgeon: [name], MD;  Location: [facility];  Service: Endoscopy;;  . TOTAL HIP ARTHROPLASTY Right 11/26/2016   Procedure: RIGHT TOTAL HIP ARTHROPLASTY;  Surgeon: Garald Balding, MD;  Location: [place];  Service: Orthopedics;  Laterality: Right;  . TOTAL KNEE ARTHROPLASTY Right     There were no vitals filed for this visit.  Subjective Assessment - 04/30/19 1003    Subjective  Pt reports that she is doing a lot better today.     Currently in Pain?  No/denies                       [facility]  Adult PT Treatment/Exercise - 04/30/19 0001      Exercises   Exercises  Neck      Neck Exercises: Machines for Strengthening   Nustep  L4 x 19min       Modalities   Modalities  Traction      Moist Heat Therapy   Number Minutes Moist Heat  15 Minutes    Moist Heat Location  Cervical;Lumbar Spine      Electrical Stimulation   Electrical Stimulation Location  cervical    Electrical Stimulation Action  IFC    Electrical Stimulation Parameters  supine    Electrical Stimulation Goals  Pain      Traction   Type of Traction  Cervical    Max (lbs)  10      Manual Therapy   Manual Therapy  Passive ROM;Neural Stretch     Passive ROM  Passive stretch of the HS and piriformis    Neural Stretch  right UE and left LE               PT Short Term Goals - 04/21/19 1220      PT SHORT TERM GOAL #1   Title  independent with initial HEP    Time  2    Period  Weeks    Status  New        PT Long Term Goals - 04/21/19 1220      PT LONG TERM GOAL #1   Title  report able to do hair wihtout pain >3/10    Time  8    Period  Weeks    Status  New      PT LONG TERM GOAL #2   Title  report able to dress wihtout pain >3/10    Time  8    Period  Weeks    Status  New      PT LONG TERM GOAL #3   Title  increase strength of the right shoulder to 4/5    Time  8    Period  Weeks    Status  New      PT LONG TERM GOAL #4   Title  increase cervical ROM 25%    Time  8    Period  Weeks    Status  New      PT LONG TERM GOAL #5   Title  decrease pain overall 50%    Time  8    Period  Weeks    Status  New            Plan - 04/30/19 1044    Clinical Impression Statement  Pt reports that she is feeling better. She was able to do some aerobic warm up without issue. Some stiffness in the piriformis bilaterally. Some tightness also noted in the UE's L>R    Personal Factors and Comorbidities  Age;Comorbidity 3+    Comorbidities  GERD, anemia, THR, TKR, RC repair, SOB    Examination-Activity Limitations  Carry;Lift;Reach Overhead    Examination-Participation Restrictions  Cleaning;Yard Work;Driving    Stability/Clinical Decision Making  Evolving/Moderate complexity    Rehab Potential  Good    PT Frequency  2x / week    PT Duration  8 weeks    PT Next Visit Plan  assess Tx, try to add some scapular retraction exercises       Patient will benefit from skilled therapeutic intervention in order to improve the following deficits and impairments:  Increased muscle spasms,  Improper body mechanics, Decreased range of motion, Decreased strength, Impaired UE functional use, Postural dysfunction,  Pain  Visit Diagnosis: Cervicalgia  Acute pain of right shoulder  Stiffness of right shoulder, not elsewhere classified     Problem List Patient Active Problem List   Diagnosis Date Noted  . Injury of right upper arm 02/01/2019  . Chronic pain of left knee 11/09/2018  . Impingement syndrome of left ankle 07/13/2018  . Zinc deficiency 10/03/2017  . Primary insomnia 09/30/2017  . Spondylosis, lumbar, with myelopathy 04/09/2017  . Carpal tunnel syndrome, right upper limb 04/09/2017  . GERD (gastroesophageal reflux disease) 08/03/2015  . Hemolytic anemia ([place]) 07/12/2015  . Chronic venous insufficiency 08/10/2014  . Aortic stenosis, mild 06/29/2014  . Left ventricular diastolic dysfunction, NYHA class 1 06/29/2014  . Senile osteopenia 04/21/2014  . Hyperlipidemia with target LDL less than 130 04/21/2014  . Kidney disease, chronic, stage III (GFR 30-59 ml/min) ([place]) 04/21/2014  . Routine health maintenance 07/16/2012  . Carotid artery stenosis without cerebral infarction 01/16/2010  . Essential hypertension 10/19/2007    Scot Jun, PTA 04/30/2019, 10:47 AM  [address] [place], [place], [address] Phone: [phone]   Fax:  [phone]  Name: Claudia Lara MRN: [id number] Date of Birth: 1932-02-14

## 2019-05-04 ENCOUNTER — Other Ambulatory Visit: Payer: Self-pay

## 2019-05-04 ENCOUNTER — Encounter: Payer: Self-pay | Admitting: Physical Therapy

## 2019-05-04 ENCOUNTER — Ambulatory Visit: Payer: Medicare Other | Admitting: Physical Therapy

## 2019-05-04 DIAGNOSIS — M25611 Stiffness of right shoulder, not elsewhere classified: Secondary | ICD-10-CM

## 2019-05-04 DIAGNOSIS — M25511 Pain in right shoulder: Secondary | ICD-10-CM

## 2019-05-04 DIAGNOSIS — M542 Cervicalgia: Secondary | ICD-10-CM

## 2019-05-04 DIAGNOSIS — M25562 Pain in left knee: Secondary | ICD-10-CM | POA: Diagnosis not present

## 2019-05-04 NOTE — Therapy (Signed)
[address] [place], [place], [address] Phone: [phone]   Fax:  [phone]  Physical Therapy Treatment  Patient Details  Name: Claudia Lara MRN: [id number] Date of Birth: 05-17-1932 Referring Provider (PT): D. Rolena Infante   Encounter Date: 05/04/2019  PT End of Session - 05/04/19 1334    Visit Number  4    Date for PT Re-Evaluation  06/21/19    PT Start Time  1307    PT Stop Time  1401    PT Time Calculation (min)  54 min    Activity Tolerance  Patient tolerated treatment well    Behavior During Therapy  [facility] for tasks assessed/performed       Past Medical History:  Diagnosis Date  . ANEMIA 08/22/2009  . CAROTID ARTERY STENOSIS 01/16/2010  . Complication of anesthesia    very hard to wake up from surgery  . DYSPNEA ON EXERTION 11/16/2007  . Gallstones   . GEN OSTEOARTHROSIS INVOLVING MULTIPLE SITES 11/11/2008  . GERD (gastroesophageal reflux disease)   . HEMORRHOIDS, INTERNAL    surgery was in the 90's (per patient)  . NECK PAIN, ACUTE 12/28/2009  . PERIPHERAL EDEMA 10/06/2007  . PVD 10/06/2007  . Unspecified essential hypertension 10/19/2007    Past Surgical History:  Procedure Laterality Date  . ABDOMINAL HYSTERECTOMY    . BUNIONECTOMY     bilat  . CARPAL TUNNEL RELEASE    . CHOLECYSTECTOMY  01/29/2016   at [facility]  . CHOLECYSTECTOMY    . ENDOSCOPIC RETROGRADE CHOLANGIOPANCREATOGRAPHY (ERCP) WITH PROPOFOL N/A 12/01/2015   Procedure: ENDOSCOPIC RETROGRADE CHOLANGIOPANCREATOGRAPHY (ERCP) WITH PROPOFOL;  Surgeon: [name], MD;  [facility];  Service: Endoscopy;  Laterality: N/A;  . ENDOSCOPIC RETROGRADE CHOLANGIOPANCREATOGRAPHY (ERCP) WITH PROPOFOL N/A 02/01/2016   Procedure: ENDOSCOPIC RETROGRADE CHOLANGIOPANCREATOGRAPHY (ERCP) WITH PROPOFOL;  Surgeon: [name], MD;  [facility];  Service: Endoscopy;  Laterality: N/A;  . ENDOSCOPIC RETROGRADE  CHOLANGIOPANCREATOGRAPHY (ERCP) WITH PROPOFOL N/A 04/07/2018   Procedure: ENDOSCOPIC RETROGRADE CHOLANGIOPANCREATOGRAPHY (ERCP) WITH PROPOFOL;  Surgeon: [name], MD;  [facility];  Service: Endoscopy;  Laterality: N/A;  . HAMMER TOE SURGERY    . HEMORRHOID SURGERY    . Hyperplastic colon polyps, removed  2007   By Dr. Penelope Coop  . JOINT REPLACEMENT  2011   right knee  . OOPHORECTOMY    . REMOVAL OF STONES  04/07/2018   Procedure: REMOVAL OF STONES;  Surgeon: [name], MD;  [facility];  Service: Endoscopy;;  . right hip replacement    . ROTATOR CUFF REPAIR  2004   left (Dr. Durward Fortes)  . SHOULDER ARTHROSCOPY    . SPHINCTEROTOMY  04/07/2018   Procedure: SPHINCTEROTOMY;  Surgeon: [name], MD;  Location: [facility];  Service: Endoscopy;;  . TOTAL HIP ARTHROPLASTY Right 11/26/2016   Procedure: RIGHT TOTAL HIP ARTHROPLASTY;  Surgeon: Garald Balding, MD;  Location: [place];  Service: Orthopedics;  Laterality: Right;  . TOTAL KNEE ARTHROPLASTY Right     There were no vitals filed for this visit.  Subjective Assessment - 05/04/19 1328    Subjective  Patient reports that her knee bothered her for the next few days after doing the "NuStep", "I don't want to do that again    Currently in Pain?  Yes    Pain Score  4     Pain Location  Neck    Pain Orientation  Right  Pain Descriptors / Indicators  Tightness;Spasm    Aggravating Factors   that nustep really made me worse as far as knee pain                       [facility] Adult PT Treatment/Exercise - 05/04/19 0001      Modalities   Modalities  Traction      Moist Heat Therapy   Number Minutes Moist Heat  15 Minutes    Moist Heat Location  Cervical;Lumbar Spine      Electrical Stimulation   Electrical Stimulation Location  cervical    Electrical Stimulation Action  IFC    Electrical Stimulation Parameters  supine    Electrical Stimulation Goals  Pain      Traction   Type of  Traction  Cervical    Max (lbs)  10-12    Rest Time  static    Time  12      Manual Therapy   Manual Therapy  Passive ROM;Neural Stretch;Soft tissue mobilization    Soft tissue mobilization  to the right upper trap into the cervical paraspinals    Passive ROM  Passive stretch of the HS and piriformis, gentle passive stretch of the cervical spine for rotation and side bending    Neural Stretch  right UE and left LE               PT Short Term Goals - 05/04/19 1337      PT SHORT TERM GOAL #1   Title  independent with initial HEP    Status  Partially Met        PT Long Term Goals - 04/21/19 1220      PT LONG TERM GOAL #1   Title  report able to do hair wihtout pain >3/10    Time  8    Period  Weeks    Status  New      PT LONG TERM GOAL #2   Title  report able to dress wihtout pain >3/10    Time  8    Period  Weeks    Status  New      PT LONG TERM GOAL #3   Title  increase strength of the right shoulder to 4/5    Time  8    Period  Weeks    Status  New      PT LONG TERM GOAL #4   Title  increase cervical ROM 25%    Time  8    Period  Weeks    Status  New      PT LONG TERM GOAL #5   Title  decrease pain overall 50%    Time  8    Period  Weeks    Status  New            Plan - 05/04/19 1336    Clinical Impression Statement  Patient reports that the Nustep really bothered her knees, she does not want to do that again, she has a large knot in the right upper trap area.  She does report some tingling in the right UE    PT Next Visit Plan  may see if I can add some exercises    Consulted and Agree with Plan of Care  Patient       Patient will benefit from skilled therapeutic intervention in order to improve the following deficits and impairments:  Increased muscle spasms, Improper body mechanics, Decreased  range of motion, Decreased strength, Impaired UE functional use, Postural dysfunction, Pain  Visit Diagnosis: Cervicalgia  Acute pain of right  shoulder  Stiffness of right shoulder, not elsewhere classified     Problem List Patient Active Problem List   Diagnosis Date Noted  . Injury of right upper arm 02/01/2019  . Chronic pain of left knee 11/09/2018  . Impingement syndrome of left ankle 07/13/2018  . Zinc deficiency 10/03/2017  . Primary insomnia 09/30/2017  . Spondylosis, lumbar, with myelopathy 04/09/2017  . Carpal tunnel syndrome, right upper limb 04/09/2017  . GERD (gastroesophageal reflux disease) 08/03/2015  . Hemolytic anemia ([place]) 07/12/2015  . Chronic venous insufficiency 08/10/2014  . Aortic stenosis, mild 06/29/2014  . Left ventricular diastolic dysfunction, NYHA class 1 06/29/2014  . Senile osteopenia 04/21/2014  . Hyperlipidemia with target LDL less than 130 04/21/2014  . Kidney disease, chronic, stage III (GFR 30-59 ml/min) ([place]) 04/21/2014  . Routine health maintenance 07/16/2012  . Carotid artery stenosis without cerebral infarction 01/16/2010  . Essential hypertension 10/19/2007    Sumner Boast., PT 05/04/2019, 1:38 PM  [address] [place], [place], [address] Phone: [phone]   Fax:  [phone]  Name: Claudia Lara MRN: [id number] Date of Birth: 04-12-1932

## 2019-05-06 ENCOUNTER — Encounter: Payer: Self-pay | Admitting: Physical Therapy

## 2019-05-06 ENCOUNTER — Other Ambulatory Visit: Payer: Self-pay

## 2019-05-06 ENCOUNTER — Ambulatory Visit: Payer: Medicare Other | Admitting: Physical Therapy

## 2019-05-06 DIAGNOSIS — M25511 Pain in right shoulder: Secondary | ICD-10-CM | POA: Diagnosis not present

## 2019-05-06 DIAGNOSIS — M25562 Pain in left knee: Secondary | ICD-10-CM | POA: Diagnosis not present

## 2019-05-06 DIAGNOSIS — M542 Cervicalgia: Secondary | ICD-10-CM | POA: Diagnosis not present

## 2019-05-06 DIAGNOSIS — M25611 Stiffness of right shoulder, not elsewhere classified: Secondary | ICD-10-CM | POA: Diagnosis not present

## 2019-05-06 NOTE — Therapy (Signed)
[address] [place], [place], [address] Phone: [phone]   Fax:  [phone]  Physical Therapy Treatment  Patient Details  Name: Claudia Lara MRN: [id number] Date of Birth: [birth year] Referring Provider (PT): D. Rolena Infante   Encounter Date: 05/06/2019  PT End of Session - 05/06/19 1338    Visit Number  5    Date for PT Re-Evaluation  06/21/19    PT Start Time  2878    PT Stop Time  1356    PT Time Calculation (min)  48 min    Activity Tolerance  Patient tolerated treatment well    Behavior During Therapy  [facility] for tasks assessed/performed       Past Medical History:  Diagnosis Date  . ANEMIA 08/22/2009  . CAROTID ARTERY STENOSIS 01/16/2010  . Complication of anesthesia    very hard to wake up from surgery  . DYSPNEA ON EXERTION 11/16/2007  . Gallstones   . GEN OSTEOARTHROSIS INVOLVING MULTIPLE SITES 11/11/2008  . GERD (gastroesophageal reflux disease)   . HEMORRHOIDS, INTERNAL    surgery was in the 90's (per patient)  . NECK PAIN, ACUTE 12/28/2009  . PERIPHERAL EDEMA 10/06/2007  . PVD 10/06/2007  . Unspecified essential hypertension 10/19/2007    Past Surgical History:  Procedure Laterality Date  . ABDOMINAL HYSTERECTOMY    . BUNIONECTOMY     bilat  . CARPAL TUNNEL RELEASE    . CHOLECYSTECTOMY  01/29/2016   at [facility]  . CHOLECYSTECTOMY    . ENDOSCOPIC RETROGRADE CHOLANGIOPANCREATOGRAPHY (ERCP) WITH PROPOFOL N/A 12/01/2015   Procedure: ENDOSCOPIC RETROGRADE CHOLANGIOPANCREATOGRAPHY (ERCP) WITH PROPOFOL;  Surgeon: [name], MD;  [facility];  Service: Endoscopy;  Laterality: N/A;  . ENDOSCOPIC RETROGRADE CHOLANGIOPANCREATOGRAPHY (ERCP) WITH PROPOFOL N/A 02/01/2016   Procedure: ENDOSCOPIC RETROGRADE CHOLANGIOPANCREATOGRAPHY (ERCP) WITH PROPOFOL;  Surgeon: [name], MD;  [facility];  Service: Endoscopy;  Laterality: N/A;  . ENDOSCOPIC RETROGRADE  CHOLANGIOPANCREATOGRAPHY (ERCP) WITH PROPOFOL N/A 04/07/2018   Procedure: ENDOSCOPIC RETROGRADE CHOLANGIOPANCREATOGRAPHY (ERCP) WITH PROPOFOL;  Surgeon: [name], MD;  [facility];  Service: Endoscopy;  Laterality: N/A;  . HAMMER TOE SURGERY    . HEMORRHOID SURGERY    . Hyperplastic colon polyps, removed  2007   By Dr. Penelope Coop  . JOINT REPLACEMENT  2011   right knee  . OOPHORECTOMY    . REMOVAL OF STONES  04/07/2018   Procedure: REMOVAL OF STONES;  Surgeon: [name], MD;  [facility];  Service: Endoscopy;;  . right hip replacement    . ROTATOR CUFF REPAIR  2004   left (Dr. Durward Fortes)  . SHOULDER ARTHROSCOPY    . SPHINCTEROTOMY  04/07/2018   Procedure: SPHINCTEROTOMY;  Surgeon: [name], MD;  Location[facility];  Service: Endoscopy;;  . TOTAL HIP ARTHROPLASTY Right 11/26/2016   Procedure: RIGHT TOTAL HIP ARTHROPLASTY;  Surgeon: Garald Balding, MD;  Location: [place];  Service: Orthopedics;  Laterality: Right;  . TOTAL KNEE ARTHROPLASTY Right     There were no vitals filed for this visit.  Subjective Assessment - 05/06/19 1317    Subjective  Patient reports that she is feeling better today, less pain, but did have an instance yesterday that the left leg buckled due to pain in th ehip    Currently in Pain?  Yes    Pain Score  3     Pain Location  Neck    Aggravating  Factors   unsure    Pain Relieving Factors  the treatment seems to have helped                       [facility] Adult PT Treatment/Exercise - 05/06/19 0001      Modalities   Modalities  Moist Heat;Traction;Electrical Stimulation      Moist Heat Therapy   Number Minutes Moist Heat  15 Minutes    Moist Heat Location  Cervical      Electrical Stimulation   Electrical Stimulation Location  right cervical area    Electrical Stimulation Action  IFC    Electrical Stimulation Parameters  sitting    Electrical Stimulation Goals  Pain      Traction   Type of  Traction  Cervical    Max (lbs)  10-12    Rest Time  static    Time  12      Manual Therapy   Manual Therapy  Passive ROM;Neural Stretch;Soft tissue mobilization    Soft tissue mobilization  to the right upper trap into the cervical paraspinals    Passive ROM  Passive stretch of the HS and piriformis, gentle passive stretch of the cervical spine for rotation and side bending               PT Short Term Goals - 05/04/19 1337      PT SHORT TERM GOAL #1   Title  independent with initial HEP    Status  Partially Met        PT Long Term Goals - 05/06/19 1351      PT LONG TERM GOAL #1   Title  report able to do hair wihtout pain >3/10    Status  On-going      PT LONG TERM GOAL #2   Title  report able to dress wihtout pain >3/10    Status  On-going            Plan - 05/06/19 1338    Clinical Impression Statement  Patient is doing better, still some issues with the right piriformis.  She remains very tight in the upper trap and the neck area.  She does report at times the left leg gives with the sciatic type pain    PT Next Visit Plan  patient will be at the beach the next week    Consulted and Agree with Plan of Care  Patient       Patient will benefit from skilled therapeutic intervention in order to improve the following deficits and impairments:  Increased muscle spasms, Improper body mechanics, Decreased range of motion, Decreased strength, Impaired UE functional use, Postural dysfunction, Pain  Visit Diagnosis: Cervicalgia  Acute pain of right shoulder  Stiffness of right shoulder, not elsewhere classified     Problem List Patient Active Problem List   Diagnosis Date Noted  . Injury of right upper arm 02/01/2019  . Chronic pain of left knee 11/09/2018  . Impingement syndrome of left ankle 07/13/2018  . Zinc deficiency 10/03/2017  . Primary insomnia 09/30/2017  . Spondylosis, lumbar, with myelopathy 04/09/2017  . Carpal tunnel syndrome, right  upper limb 04/09/2017  . GERD (gastroesophageal reflux disease) 08/03/2015  . Hemolytic anemia ([place]) 07/12/2015  . Chronic venous insufficiency 08/10/2014  . Aortic stenosis, mild 06/29/2014  . Left ventricular diastolic dysfunction, NYHA class 1 06/29/2014  . Senile osteopenia 04/21/2014  . Hyperlipidemia with target LDL less than 130 04/21/2014  . Kidney  disease, chronic, stage III (GFR 30-59 ml/min) (HCC) 04/21/2014  . Routine health maintenance 07/16/2012  . Carotid artery stenosis without cerebral infarction 01/16/2010  . Essential hypertension 10/19/2007    Sumner Boast., PT 05/06/2019, 1:52 PM  [address] [place], [place], [address] Phone: [phone]   Fax:  [phone]  Name: Claudia Lara MRN: [id number] Date of Birth: January 30, 1932

## 2019-05-18 ENCOUNTER — Ambulatory Visit: Payer: Medicare Other | Admitting: Physical Therapy

## 2019-05-18 ENCOUNTER — Other Ambulatory Visit: Payer: Self-pay | Admitting: Internal Medicine

## 2019-05-18 ENCOUNTER — Encounter: Payer: Self-pay | Admitting: Physical Therapy

## 2019-05-18 ENCOUNTER — Other Ambulatory Visit: Payer: Self-pay

## 2019-05-18 DIAGNOSIS — M542 Cervicalgia: Secondary | ICD-10-CM

## 2019-05-18 DIAGNOSIS — M25511 Pain in right shoulder: Secondary | ICD-10-CM | POA: Diagnosis not present

## 2019-05-18 DIAGNOSIS — M25562 Pain in left knee: Secondary | ICD-10-CM | POA: Diagnosis not present

## 2019-05-18 DIAGNOSIS — M25611 Stiffness of right shoulder, not elsewhere classified: Secondary | ICD-10-CM

## 2019-05-18 NOTE — Therapy (Signed)
[address] [place], [place], [address] Phone: [phone]   Fax:  [phone]  Physical Therapy Treatment  Patient Details  Name: Claudia Lara MRN: [id number] Date of Birth: [birth year] Referring Provider (PT): D. Rolena Infante   Encounter Date: 05/18/2019  PT End of Session - 05/18/19 1338    Visit Number  6    Date for PT Re-Evaluation  06/21/19    Authorization Type  MCR and KX at visit 18    PT Start Time  2778    PT Stop Time  1355    PT Time Calculation (min)  47 min    Activity Tolerance  Patient tolerated treatment well    Behavior During Therapy  [facility] for tasks assessed/performed       Past Medical History:  Diagnosis Date  . ANEMIA 08/22/2009  . CAROTID ARTERY STENOSIS 01/16/2010  . Complication of anesthesia    very hard to wake up from surgery  . DYSPNEA ON EXERTION 11/16/2007  . Gallstones   . GEN OSTEOARTHROSIS INVOLVING MULTIPLE SITES 11/11/2008  . GERD (gastroesophageal reflux disease)   . HEMORRHOIDS, INTERNAL    surgery was in the 90's (per patient)  . NECK PAIN, ACUTE 12/28/2009  . PERIPHERAL EDEMA 10/06/2007  . PVD 10/06/2007  . Unspecified essential hypertension 10/19/2007    Past Surgical History:  Procedure Laterality Date  . ABDOMINAL HYSTERECTOMY    . BUNIONECTOMY     bilat  . CARPAL TUNNEL RELEASE    . CHOLECYSTECTOMY  01/29/2016   at [facility]  . CHOLECYSTECTOMY    . ENDOSCOPIC RETROGRADE CHOLANGIOPANCREATOGRAPHY (ERCP) WITH PROPOFOL N/A 12/01/2015   Procedure: ENDOSCOPIC RETROGRADE CHOLANGIOPANCREATOGRAPHY (ERCP) WITH PROPOFOL;  Surgeon: [name], MD;  [facility];  Service: Endoscopy;  Laterality: N/A;  . ENDOSCOPIC RETROGRADE CHOLANGIOPANCREATOGRAPHY (ERCP) WITH PROPOFOL N/A 02/01/2016   Procedure: ENDOSCOPIC RETROGRADE CHOLANGIOPANCREATOGRAPHY (ERCP) WITH PROPOFOL;  Surgeon: [name], MD;  [facility];  Service:  Endoscopy;  Laterality: N/A;  . ENDOSCOPIC RETROGRADE CHOLANGIOPANCREATOGRAPHY (ERCP) WITH PROPOFOL N/A 04/07/2018   Procedure: ENDOSCOPIC RETROGRADE CHOLANGIOPANCREATOGRAPHY (ERCP) WITH PROPOFOL;  Surgeon: [name], MD;  [facility];  Service: Endoscopy;  Laterality: N/A;  . HAMMER TOE SURGERY    . HEMORRHOID SURGERY    . Hyperplastic colon polyps, removed  2007   By Dr. Penelope Coop  . JOINT REPLACEMENT  2011   right knee  . OOPHORECTOMY    . REMOVAL OF STONES  04/07/2018   Procedure: REMOVAL OF STONES;  Surgeon: [name], MD;  [facility];  Service: Endoscopy;;  . right hip replacement    . ROTATOR CUFF REPAIR  2004   left (Dr. Durward Fortes)  . SHOULDER ARTHROSCOPY    . SPHINCTEROTOMY  04/07/2018   Procedure: SPHINCTEROTOMY;  Surgeon: [name], MD;  Location[facility];  Service: Endoscopy;;  . TOTAL HIP ARTHROPLASTY Right 11/26/2016   Procedure: RIGHT TOTAL HIP ARTHROPLASTY;  Surgeon: Garald Balding, MD;  Location: [place];  Service: Orthopedics;  Laterality: Right;  . TOTAL KNEE ARTHROPLASTY Right     There were no vitals filed for this visit.   Subjective Assessment - 05/18/19 1309    Subjective  Patient was at the beach last week, she reports that the hip is feeling better , reports that she is still tight and sore in the upper right rrap.  Reports that she is having left knee pain and needs a  replacement.    Currently in Pain?  Yes    Pain Score  3     Pain Location  Shoulder    Pain Orientation  Right    Pain Descriptors / Indicators  Aching    Aggravating Factors   reaching up and out         [facility] PT Assessment - 05/18/19 0001      AROM   Right Shoulder Flexion  122 Degrees    Right Shoulder ABduction  115 Degrees    Right Shoulder Internal Rotation  35 Degrees    Right Shoulder External Rotation  80 Degrees                Objective measurements completed on examination: See above findings.      [facility] Adult PT  Treatment/Exercise - 05/18/19 0001      Iontophoresis   Type of Iontophoresis  Dexamethasone    Location  right anterior/lateral shoulder    Dose  6m     Time  4 hour patch      Traction   Type of Traction  Cervical    Max (lbs)  10-12    Rest Time  static    Time  12      Manual Therapy   Manual Therapy  Passive ROM;Neural Stretch;Soft tissue mobilization    Soft tissue mobilization  to the right upper trap into the cervical paraspinals    Passive ROM  Passive stretch of the HS and piriformis, gentle passive stretch of the cervical spine for rotation and side bending    Neural Stretch  right UE and left LE               PT Short Term Goals - 05/04/19 1337      PT SHORT TERM GOAL #1   Title  independent with initial HEP    Status  Partially Met        PT Long Term Goals - 05/18/19 1353      PT LONG TERM GOAL #1   Title  report able to do hair wihtout pain >3/10    Status  Partially Met      PT LONG TERM GOAL #2   Title  report able to dress wihtout pain >3/10    Status  Partially Met             Plan - 05/18/19 1352    Clinical Impression Statement  Patient is doing better with ROM, still some difficulty reaching out and having some numbness in the right UE, she is reporting mostly left knee pain today.  She has significant knots and spasms in the upper trap on the right.  Tried the ionto again and used the traction    PT Next Visit Plan  over the next two weeks she will be more consistent and we will see if we can help her pain    Consulted and Agree with Plan of Care  Patient       Patient will benefit from skilled therapeutic intervention in order to improve the following deficits and impairments:  Increased muscle spasms, Improper body mechanics, Decreased range of motion, Decreased strength, Impaired UE functional use, Postural dysfunction, Pain  Visit Diagnosis: 1. Cervicalgia   2. Acute pain of right shoulder   3. Stiffness of right  shoulder, not elsewhere classified        Problem List Patient Active Problem List   Diagnosis Date Noted  . Injury of  right upper arm 02/01/2019  . Chronic pain of left knee 11/09/2018  . Impingement syndrome of left ankle 07/13/2018  . Zinc deficiency 10/03/2017  . Primary insomnia 09/30/2017  . Spondylosis, lumbar, with myelopathy 04/09/2017  . Carpal tunnel syndrome, right upper limb 04/09/2017  . GERD (gastroesophageal reflux disease) 08/03/2015  . Hemolytic anemia ([place]) 07/12/2015  . Chronic venous insufficiency 08/10/2014  . Aortic stenosis, mild 06/29/2014  . Left ventricular diastolic dysfunction, NYHA class 1 06/29/2014  . Senile osteopenia 04/21/2014  . Hyperlipidemia with target LDL less than 130 04/21/2014  . Kidney disease, chronic, stage III (GFR 30-59 ml/min) ([place]) 04/21/2014  . Routine health maintenance 07/16/2012  . Carotid artery stenosis without cerebral infarction 01/16/2010  . Essential hypertension 10/19/2007    Sumner Boast., PT 05/18/2019, 1:55 PM  [address] [place], [place], [address] Phone: [phone]   Fax:  [phone]  Name: KENNESHA BREWBAKER MRN: [id number] Date of Birth: 09-23-1932

## 2019-05-20 ENCOUNTER — Encounter: Payer: Self-pay | Admitting: Physical Therapy

## 2019-05-20 ENCOUNTER — Ambulatory Visit: Payer: Medicare Other | Admitting: Physical Therapy

## 2019-05-20 ENCOUNTER — Other Ambulatory Visit: Payer: Self-pay

## 2019-05-20 DIAGNOSIS — M25562 Pain in left knee: Secondary | ICD-10-CM

## 2019-05-20 DIAGNOSIS — M25511 Pain in right shoulder: Secondary | ICD-10-CM

## 2019-05-20 DIAGNOSIS — M542 Cervicalgia: Secondary | ICD-10-CM

## 2019-05-20 DIAGNOSIS — M25611 Stiffness of right shoulder, not elsewhere classified: Secondary | ICD-10-CM

## 2019-05-20 NOTE — Therapy (Signed)
[address] [place], [place], [address] Phone: [phone]   Fax:  [phone]  Physical Therapy Treatment  Patient Details  Name: Claudia Lara MRN: [id number] Date of Birth: 01/29/1932 Referring Provider (PT): D. Rolena Infante   Encounter Date: 05/20/2019  PT End of Session - 05/20/19 1339    Visit Number  7    Date for PT Re-Evaluation  06/21/19    PT Start Time  1309    PT Stop Time  1359    PT Time Calculation (min)  50 min    Activity Tolerance  Patient tolerated treatment well    Behavior During Therapy  [facility] for tasks assessed/performed       Past Medical History:  Diagnosis Date  . ANEMIA 08/22/2009  . CAROTID ARTERY STENOSIS 01/16/2010  . Complication of anesthesia    very hard to wake up from surgery  . DYSPNEA ON EXERTION 11/16/2007  . Gallstones   . GEN OSTEOARTHROSIS INVOLVING MULTIPLE SITES 11/11/2008  . GERD (gastroesophageal reflux disease)   . HEMORRHOIDS, INTERNAL    surgery was in the 90's (per patient)  . NECK PAIN, ACUTE 12/28/2009  . PERIPHERAL EDEMA 10/06/2007  . PVD 10/06/2007  . Unspecified essential hypertension 10/19/2007    Past Surgical History:  Procedure Laterality Date  . ABDOMINAL HYSTERECTOMY    . BUNIONECTOMY     bilat  . CARPAL TUNNEL RELEASE    . CHOLECYSTECTOMY  01/29/2016   at [facility]  . CHOLECYSTECTOMY    . ENDOSCOPIC RETROGRADE CHOLANGIOPANCREATOGRAPHY (ERCP) WITH PROPOFOL N/A 12/01/2015   Procedure: ENDOSCOPIC RETROGRADE CHOLANGIOPANCREATOGRAPHY (ERCP) WITH PROPOFOL;  Surgeon: [name], MD;  [facility];  Lara: Endoscopy;  Laterality: N/A;  . ENDOSCOPIC RETROGRADE CHOLANGIOPANCREATOGRAPHY (ERCP) WITH PROPOFOL N/A 02/01/2016   Procedure: ENDOSCOPIC RETROGRADE CHOLANGIOPANCREATOGRAPHY (ERCP) WITH PROPOFOL;  Surgeon: [name], MD;  [facility];  Lara: Endoscopy;  Laterality: N/A;  . ENDOSCOPIC RETROGRADE  CHOLANGIOPANCREATOGRAPHY (ERCP) WITH PROPOFOL N/A 04/07/2018   Procedure: ENDOSCOPIC RETROGRADE CHOLANGIOPANCREATOGRAPHY (ERCP) WITH PROPOFOL;  Surgeon: [name], MD;  [facility];  Lara: Endoscopy;  Laterality: N/A;  . HAMMER TOE SURGERY    . HEMORRHOID SURGERY    . Hyperplastic colon polyps, removed  2007   By Dr. Penelope Coop  . JOINT REPLACEMENT  2011   right knee  . OOPHORECTOMY    . REMOVAL OF STONES  04/07/2018   Procedure: REMOVAL OF STONES;  Surgeon: [name], MD;  [facility];  Lara: Endoscopy;;  . right hip replacement    . ROTATOR CUFF REPAIR  2004   left (Dr. Durward Fortes)  . SHOULDER ARTHROSCOPY    . SPHINCTEROTOMY  04/07/2018   Procedure: SPHINCTEROTOMY;  Surgeon: [name], MD;  [facility];  Lara: Endoscopy;;  . TOTAL HIP ARTHROPLASTY Right 11/26/2016   Procedure: RIGHT TOTAL HIP ARTHROPLASTY;  Surgeon: Garald Balding, MD;  Location: [place];  Lara: Orthopedics;  Laterality: Right;  . TOTAL KNEE ARTHROPLASTY Right     There were no vitals filed for this visit.  Subjective Assessment - 05/20/19 1308    Subjective  Patient reports that her neck is feeling better, she c/o mostly left knee pain    Currently in Pain?  Yes    Pain Score  6     Pain Location  Knee    Pain Orientation  Left    Aggravating Factors   reports that the  left knee is really hurting more and reports tha tshe has to wait until Mid July to get a cortisone injection                       [facility] Adult PT Treatment/Exercise - 05/20/19 0001      Modalities   Modalities  Ultrasound      Moist Heat Therapy   Number Minutes Moist Heat  15 Minutes    Moist Heat Location  Cervical      Electrical Stimulation   Electrical Stimulation Location  right cervical area    Electrical Stimulation Action  IFC    Electrical Stimulation Parameters  sitting    Electrical Stimulation Goals  Pain      Ultrasound   Ultrasound Location  left  knee    Ultrasound Parameters  100% 1.4 w/cm2     Ultrasound Goals  Pain      Iontophoresis   Type of Iontophoresis  Dexamethasone    Location  left knee    Dose  89m     Time  4 hour patch      Manual Therapy   Manual Therapy  Passive ROM;Neural Stretch;Soft tissue mobilization    Soft tissue mobilization  to the right upper trap into the cervical paraspinals    Passive ROM  Passive stretch of the HS and piriformis, gentle passive stretch of the cervical spine for rotation and side bending               PT Short Term Goals - 05/04/19 1337      PT SHORT TERM GOAL #1   Title  independent with initial HEP    Status  Partially Met        PT Long Term Goals - 05/20/19 1343      PT LONG TERM GOAL #1   Title  report able to do hair wihtout pain >3/10    Status  Partially Met      PT LONG TERM GOAL #2   Title  report able to dress wihtout pain >3/10    Status  Partially Met      PT LONG TERM GOAL #3   Title  increase strength of the right shoulder to 4/5    Status  Partially Met            Plan - 05/20/19 1342    Clinical Impression Statement  Patient really reports that the neck is feeling better, her biggest issue today is the left knee.  I did [place] today and the heat. as well as the ionto, just to see if she can get any less pain for this    PT Next Visit Plan  see how the knee is    Consulted and Agree with Plan of Care  Patient       Patient will benefit from skilled therapeutic intervention in order to improve the following deficits and impairments:  Increased muscle spasms, Improper body mechanics, Decreased range of motion, Decreased strength, Impaired UE functional use, Postural dysfunction, Pain  Visit Diagnosis: 1. Cervicalgia   2. Acute pain of right shoulder   3. Stiffness of right shoulder, not elsewhere classified   4. Acute pain of left knee        Problem List Patient Active Problem List   Diagnosis Date Noted  . Injury of  right upper arm 02/01/2019  . Chronic pain of left knee 11/09/2018  . Impingement syndrome of left  ankle 07/13/2018  . Zinc deficiency 10/03/2017  . Primary insomnia 09/30/2017  . Spondylosis, lumbar, with myelopathy 04/09/2017  . Carpal tunnel syndrome, right upper limb 04/09/2017  . GERD (gastroesophageal reflux disease) 08/03/2015  . Hemolytic anemia ([place]) 07/12/2015  . Chronic venous insufficiency 08/10/2014  . Aortic stenosis, mild 06/29/2014  . Left ventricular diastolic dysfunction, NYHA class 1 06/29/2014  . Senile osteopenia 04/21/2014  . Hyperlipidemia with target LDL less than 130 04/21/2014  . Kidney disease, chronic, stage III (GFR 30-59 ml/min) ([place]) 04/21/2014  . Routine health maintenance 07/16/2012  . Carotid artery stenosis without cerebral infarction 01/16/2010  . Essential hypertension 10/19/2007    Sumner Boast., PT 05/20/2019, 1:44 PM  [address] [place], [place], [address] Phone: [phone]   Fax:  [phone]  Name: Claudia Lara MRN: [id number] Date of Birth: November 05, 1932

## 2019-05-25 ENCOUNTER — Encounter: Payer: Self-pay | Admitting: Physical Therapy

## 2019-05-25 ENCOUNTER — Ambulatory Visit: Payer: Medicare Other | Admitting: Physical Therapy

## 2019-05-25 ENCOUNTER — Other Ambulatory Visit: Payer: Self-pay

## 2019-05-25 DIAGNOSIS — M25611 Stiffness of right shoulder, not elsewhere classified: Secondary | ICD-10-CM

## 2019-05-25 DIAGNOSIS — M25562 Pain in left knee: Secondary | ICD-10-CM | POA: Diagnosis not present

## 2019-05-25 DIAGNOSIS — M25511 Pain in right shoulder: Secondary | ICD-10-CM | POA: Diagnosis not present

## 2019-05-25 DIAGNOSIS — M542 Cervicalgia: Secondary | ICD-10-CM

## 2019-05-25 NOTE — Therapy (Signed)
[address] [place], [place], [address] Phone: [phone]   Fax:  [phone]  Physical Therapy Treatment  Patient Details  Name: Claudia Lara MRN: [id number] Date of Birth: 1932-11-11 Referring Provider (PT): D. Rolena Infante   Encounter Date: 05/25/2019  PT End of Session - 05/25/19 0903    Visit Number  8    PT Start Time  6063    PT Stop Time  0930    PT Time Calculation (min)  53 min    Activity Tolerance  Patient tolerated treatment well    Behavior During Therapy  [facility] for tasks assessed/performed       Past Medical History:  Diagnosis Date  . ANEMIA 08/22/2009  . CAROTID ARTERY STENOSIS 01/16/2010  . Complication of anesthesia    very hard to wake up from surgery  . DYSPNEA ON EXERTION 11/16/2007  . Gallstones   . GEN OSTEOARTHROSIS INVOLVING MULTIPLE SITES 11/11/2008  . GERD (gastroesophageal reflux disease)   . HEMORRHOIDS, INTERNAL    surgery was in the 90's (per patient)  . NECK PAIN, ACUTE 12/28/2009  . PERIPHERAL EDEMA 10/06/2007  . PVD 10/06/2007  . Unspecified essential hypertension 10/19/2007    Past Surgical History:  Procedure Laterality Date  . ABDOMINAL HYSTERECTOMY    . BUNIONECTOMY     bilat  . CARPAL TUNNEL RELEASE    . CHOLECYSTECTOMY  01/29/2016   at [facility]  . CHOLECYSTECTOMY    . ENDOSCOPIC RETROGRADE CHOLANGIOPANCREATOGRAPHY (ERCP) WITH PROPOFOL N/A 12/01/2015   Procedure: ENDOSCOPIC RETROGRADE CHOLANGIOPANCREATOGRAPHY (ERCP) WITH PROPOFOL;  Surgeon: [name], MD;  Location: [facility];  Service: Endoscopy;  Laterality: N/A;  . ENDOSCOPIC RETROGRADE CHOLANGIOPANCREATOGRAPHY (ERCP) WITH PROPOFOL N/A 02/01/2016   Procedure: ENDOSCOPIC RETROGRADE CHOLANGIOPANCREATOGRAPHY (ERCP) WITH PROPOFOL;  Surgeon: [name], MD;  Location: [facility];  Service: Endoscopy;  Laterality: N/A;  . ENDOSCOPIC RETROGRADE CHOLANGIOPANCREATOGRAPHY (ERCP) WITH  PROPOFOL N/A 04/07/2018   Procedure: ENDOSCOPIC RETROGRADE CHOLANGIOPANCREATOGRAPHY (ERCP) WITH PROPOFOL;  Surgeon: [name], MD;  Location: [facility];  Service: Endoscopy;  Laterality: N/A;  . HAMMER TOE SURGERY    . HEMORRHOID SURGERY    . Hyperplastic colon polyps, removed  2007   By Dr. Penelope Coop  . JOINT REPLACEMENT  2011   right knee  . OOPHORECTOMY    . REMOVAL OF STONES  04/07/2018   Procedure: REMOVAL OF STONES;  Surgeon: [name], MD;  Location: [facility];  Service: Endoscopy;;  . right hip replacement    . ROTATOR CUFF REPAIR  2004   left (Dr. Durward Fortes)  . SHOULDER ARTHROSCOPY    . SPHINCTEROTOMY  04/07/2018   Procedure: SPHINCTEROTOMY;  Surgeon: [name], MD;  Location: [facility];  Service: Endoscopy;;  . TOTAL HIP ARTHROPLASTY Right 11/26/2016   Procedure: RIGHT TOTAL HIP ARTHROPLASTY;  Surgeon: Garald Balding, MD;  Location: [facility];  Service: Orthopedics;  Laterality: Right;  . TOTAL KNEE ARTHROPLASTY Right     There were no vitals filed for this visit.  Subjective Assessment - 05/25/19 0859    Subjective  Patient reports that she feels like what we are doing is really working.  Feels like she can do on her own now    Currently in Pain?  Yes    Pain Score  1     Pain Location  Neck    Pain Relieving Factors  the treatment  [facility] Adult PT Treatment/Exercise - 05/25/19 0001      Neck Exercises: Supine   Neck Retraction  15 reps    Other Supine Exercise  W backs, shoulder shrugs, gentle upper trap and levator stretches      Moist Heat Therapy   Number Minutes Moist Heat  12 Minutes    Moist Heat Location  Cervical      Electrical Stimulation   Electrical Stimulation Location  right cervical area    Electrical Stimulation Action  IFC    Electrical Stimulation Parameters  sitting    Electrical Stimulation Goals  Pain      Traction   Type of Traction  Cervical    Max (lbs)  10-12    Rest Time   static    Time  12      Manual Therapy   Manual Therapy  Passive ROM;Neural Stretch;Soft tissue mobilization    Soft tissue mobilization  to the right upper trap into the cervical paraspinals             PT Education - 05/25/19 0903    Education Details  went over the HEP and had her perform, some cues needed    Person(s) Educated  Patient    Methods  Explanation;Demonstration    Comprehension  Verbalized understanding;Returned demonstration;Verbal cues required       PT Short Term Goals - 05/04/19 1337      PT SHORT TERM GOAL #1   Title  independent with initial HEP    Status  Partially Met        PT Long Term Goals - 05/25/19 0905      PT LONG TERM GOAL #1   Title  report able to do hair wihtout pain >3/10    Status  Achieved      PT LONG TERM GOAL #2   Title  report able to dress wihtout pain >3/10    Status  Achieved      PT LONG TERM GOAL #3   Title  increase strength of the right shoulder to 4/5    Status  Achieved      PT LONG TERM GOAL #4   Title  increase cervical ROM 25%    Status  Achieved      PT LONG TERM GOAL #5   Title  decrease pain overall 50%    Status  Achieved            Plan - 05/25/19 0904    Clinical Impression Statement  Patient feels like she is doing well, feels like she can do on her own, her ROM is much improved.  Still some tightness in the right upper trap and neck area    PT Next Visit Plan  D/C with goals met    Consulted and Agree with Plan of Care  Patient       Patient will benefit from skilled therapeutic intervention in order to improve the following deficits and impairments:  Increased muscle spasms, Improper body mechanics, Decreased range of motion, Decreased strength, Impaired UE functional use, Postural dysfunction, Pain  Visit Diagnosis: 1. Cervicalgia   2. Acute pain of right shoulder   3. Stiffness of right shoulder, not elsewhere classified   4. Acute pain of left knee        Problem  List Patient Active Problem List   Diagnosis Date Noted  . Injury of right upper arm 02/01/2019  . Chronic pain of left knee 11/09/2018  . Impingement  syndrome of left ankle 07/13/2018  . Zinc deficiency 10/03/2017  . Primary insomnia 09/30/2017  . Spondylosis, lumbar, with myelopathy 04/09/2017  . Carpal tunnel syndrome, right upper limb 04/09/2017  . GERD (gastroesophageal reflux disease) 08/03/2015  . Hemolytic anemia ([place]) 07/12/2015  . Chronic venous insufficiency 08/10/2014  . Aortic stenosis, mild 06/29/2014  . Left ventricular diastolic dysfunction, NYHA class 1 06/29/2014  . Senile osteopenia 04/21/2014  . Hyperlipidemia with target LDL less than 130 04/21/2014  . Kidney disease, chronic, stage III (GFR 30-59 ml/min) ([place]) 04/21/2014  . Routine health maintenance 07/16/2012  . Carotid artery stenosis without cerebral infarction 01/16/2010  . Essential hypertension 10/19/2007    Sumner Boast., PT 05/25/2019, 9:06 AM  [address] [place], [place], [address] Phone: [phone]   Fax:  [phone]  Name: TALLULAH HOSMAN MRN: [id number] Date of Birth: January 24, 1932

## 2019-06-08 ENCOUNTER — Other Ambulatory Visit: Payer: Self-pay

## 2019-06-08 ENCOUNTER — Encounter: Payer: Self-pay | Admitting: Internal Medicine

## 2019-06-08 ENCOUNTER — Other Ambulatory Visit (INDEPENDENT_AMBULATORY_CARE_PROVIDER_SITE_OTHER): Payer: Medicare Other

## 2019-06-08 ENCOUNTER — Ambulatory Visit (INDEPENDENT_AMBULATORY_CARE_PROVIDER_SITE_OTHER): Payer: Medicare Other | Admitting: Internal Medicine

## 2019-06-08 VITALS — BP 138/60 | HR 76 | Temp 98.6°F | Ht 63.0 in | Wt 172.5 lb

## 2019-06-08 DIAGNOSIS — I10 Essential (primary) hypertension: Secondary | ICD-10-CM

## 2019-06-08 DIAGNOSIS — K219 Gastro-esophageal reflux disease without esophagitis: Secondary | ICD-10-CM

## 2019-06-08 DIAGNOSIS — R300 Dysuria: Secondary | ICD-10-CM

## 2019-06-08 DIAGNOSIS — I5189 Other ill-defined heart diseases: Secondary | ICD-10-CM

## 2019-06-08 DIAGNOSIS — D599 Acquired hemolytic anemia, unspecified: Secondary | ICD-10-CM | POA: Diagnosis not present

## 2019-06-08 DIAGNOSIS — R739 Hyperglycemia, unspecified: Secondary | ICD-10-CM

## 2019-06-08 DIAGNOSIS — N183 Chronic kidney disease, stage 3 unspecified: Secondary | ICD-10-CM

## 2019-06-08 DIAGNOSIS — E785 Hyperlipidemia, unspecified: Secondary | ICD-10-CM | POA: Diagnosis not present

## 2019-06-08 DIAGNOSIS — E60 Dietary zinc deficiency: Secondary | ICD-10-CM | POA: Diagnosis not present

## 2019-06-08 DIAGNOSIS — I519 Heart disease, unspecified: Secondary | ICD-10-CM

## 2019-06-08 DIAGNOSIS — R600 Localized edema: Secondary | ICD-10-CM

## 2019-06-08 LAB — LIPID PANEL
Cholesterol: 120 mg/dL (ref 0–200)
HDL: 53.9 mg/dL (ref >39.00)
LDL Cholesterol: 45 mg/dL (ref 0–99)
NonHDL: 65.84
Total CHOL/HDL Ratio: 2
Triglycerides: 104 mg/dL (ref 0.0–149.0)
VLDL: 20.8 mg/dL (ref 0.0–40.0)

## 2019-06-08 LAB — BASIC METABOLIC PANEL
BUN: 23 mg/dL (ref 6–23)
CO2: 30 mEq/L (ref 19–32)
Calcium: 9 mg/dL (ref 8.4–10.5)
Chloride: 105 mEq/L (ref 96–112)
Creatinine, Ser: 1.28 mg/dL — ABNORMAL HIGH (ref 0.40–1.20)
GFR: 39.61 mL/min — ABNORMAL LOW (ref >60.00)
Glucose, Bld: 97 mg/dL (ref 70–99)
Potassium: 4.5 mEq/L (ref 3.5–5.1)
Sodium: 141 mEq/L (ref 135–145)

## 2019-06-08 LAB — URINALYSIS, ROUTINE W REFLEX MICROSCOPIC
Bilirubin Urine: NEGATIVE
Hgb urine dipstick: NEGATIVE
Ketones, ur: NEGATIVE
Nitrite: NEGATIVE
Specific Gravity, Urine: 1.01 (ref 1.000–1.030)
Total Protein, Urine: NEGATIVE
Urine Glucose: NEGATIVE
Urobilinogen, UA: 0.2 (ref 0.0–1.0)
pH: 6.5 (ref 5.0–8.0)

## 2019-06-08 LAB — CBC WITH DIFFERENTIAL/PLATELET
Basophils Absolute: 0.1 10*3/uL (ref 0.0–0.1)
Basophils Relative: 0.8 % (ref 0.0–3.0)
Eosinophils Absolute: 0.6 10*3/uL (ref 0.0–0.7)
Eosinophils Relative: 6.5 % — ABNORMAL HIGH (ref 0.0–5.0)
HCT: 28.3 % — ABNORMAL LOW (ref 36.0–46.0)
Hemoglobin: 9.9 g/dL — ABNORMAL LOW (ref 12.0–15.0)
Lymphocytes Relative: 13.2 % (ref 12.0–46.0)
Lymphs Abs: 1.1 10*3/uL (ref 0.7–4.0)
MCHC: 34.8 g/dL (ref 30.0–36.0)
MCV: 91.4 fl (ref 78.0–100.0)
Monocytes Absolute: 0.8 10*3/uL (ref 0.1–1.0)
Monocytes Relative: 8.8 % (ref 3.0–12.0)
Neutro Abs: 6.1 10*3/uL (ref 1.4–7.7)
Neutrophils Relative %: 70.7 % (ref 43.0–77.0)
Platelets: 207 10*3/uL (ref 150.0–400.0)
RBC: 3.1 Mil/uL — ABNORMAL LOW (ref 3.87–5.11)
RDW: 19.8 % — ABNORMAL HIGH (ref 11.5–15.5)
WBC: 8.7 10*3/uL (ref 4.0–10.5)

## 2019-06-08 LAB — HEMOGLOBIN A1C: Hgb A1c MFr Bld: 4.8 % (ref 4.6–6.5)

## 2019-06-08 MED ORDER — TORSEMIDE 20 MG PO TABS: 20.0000 mg | ORAL_TABLET | Freq: Every day | ORAL | 1 refills | Status: DC

## 2019-06-08 MED ORDER — PANTOPRAZOLE SODIUM 40 MG PO TBEC: 40.0000 mg | DELAYED_RELEASE_TABLET | Freq: Every day | ORAL | 1 refills | Status: DC

## 2019-06-08 NOTE — Progress Notes (Signed)
Subjective:  Patient ID: Claudia Lara, female    DOB: 05/09/1932  Age: 85 y.o. MRN: [id number]  CC: Anemia, Hypertension, and Hyperlipidemia   HPI Claudia Lara presents for f/up - She complains of a several week history of lower extremity edema located around the ankles, worse on the left.  She denies chest pain, shortness of breath, palpitations, dizziness, or lightheadedness.  She complains that her urine smells bad and she wants to do a urine culture to see if she has a UTI.  Outpatient Medications Prior to Visit  Medication Sig Dispense Refill  . Calcium Carb-Cholecalciferol (CALCIUM CARBONATE-VITAMIN D3 PO) Take by mouth.    . carvedilol (COREG) 12.5 MG tablet TAKE 1 TABLET (12.5 MG TOTAL) BY MOUTH 2 (TWO) TIMES DAILY WITH A MEAL. 180 tablet 1  . cholecalciferol (VITAMIN D) 1000 units tablet Take 1,000 Units by mouth daily.    [name] glucosamine-chondroitin 500-400 MG tablet Take 1 tablet by mouth 3 (three) times daily.    . IRON PO Take 65 mg by mouth daily.     . meloxicam (MOBIC) 7.5 MG tablet TAKE 1 TABLET BY MOUTH DAILY 90 tablet 0  . vitamin C (ASCORBIC ACID) 500 MG tablet Take 500 mg by mouth daily.    [name] zinc gluconate 50 MG tablet Take 1 tablet (50 mg total) daily by mouth. (Patient taking differently: Take 50 mg by mouth daily as needed. ) 90 tablet 1  . Multiple Vitamins-Minerals (CENTRUM SILVER PO) Take 1 tablet by mouth daily.     . pantoprazole (PROTONIX) 40 MG tablet TAKE 1 TABLET (40 MG TOTAL) BY MOUTH DAILY. 90 tablet 1  . torsemide (DEMADEX) 10 MG tablet Take 1 tablet (10 mg total) by mouth daily. (Patient taking differently: Take 10 mg by mouth daily as needed. ) 90 tablet 1  . traMADol (ULTRAM) 50 MG tablet TAKE 1 TABLET (50 MG TOTAL) BY MOUTH EVERY 12 (TWELVE) HOURS AS NEEDED. (Patient not taking: Reported on 06/08/2019) 60 tablet 3  . methylPREDNISolone acetate (DEPO-MEDROL) injection 80 mg      No facility-administered medications prior to visit.     ROS Review  of Systems  Constitutional: Negative for chills, diaphoresis, fatigue and fever.  HENT: Negative.   Eyes: Negative for visual disturbance.  Respiratory: Negative for cough, chest tightness and wheezing.   Cardiovascular: Positive for leg swelling. Negative for chest pain.  Gastrointestinal: Negative for abdominal pain, constipation, diarrhea and vomiting.  Endocrine: Negative.   Genitourinary: Positive for dysuria. Negative for decreased urine volume, difficulty urinating, frequency, hematuria and urgency.  Musculoskeletal: Positive for arthralgias. Negative for myalgias.  Skin: Negative.   Neurological: Negative for dizziness, weakness and light-headedness.  Hematological: Negative for adenopathy. Does not bruise/bleed easily.  Psychiatric/Behavioral: Negative.     Objective:  BP 138/60 (BP Location: Left Arm, Patient Position: Sitting, Cuff Size: Normal)   Pulse 76   Temp 98.6 F (37 C) (Oral)   Ht 5\' 3"  (1.6 m)   Wt 172 lb 8 oz (78.2 kg)   SpO2 96%   BMI 30.56 kg/m   BP Readings from Last 3 Encounters:  06/08/19 138/60  02/01/19 (!) 160/50  01/25/19 (!) 150/59    Wt Readings from Last 3 Encounters:  06/08/19 172 lb 8 oz (78.2 kg)  02/01/19 173 lb 8 oz (78.7 kg)  01/12/19 174 lb (78.9 kg)    Physical Exam Vitals signs reviewed.  Constitutional:      Appearance: She is obese. She is  not ill-appearing or diaphoretic.  HENT:     Nose: Nose normal.     Mouth/Throat:     Mouth: Mucous membranes are moist.     Pharynx: No oropharyngeal exudate.  Eyes:     General: No scleral icterus.    Conjunctiva/sclera: Conjunctivae normal.  Neck:     Musculoskeletal: Normal range of motion. No neck rigidity.  Cardiovascular:     Rate and Rhythm: Normal rate and regular rhythm.     Heart sounds: Murmur present. Systolic murmur present with a grade of 1/6. No diastolic murmur. No gallop.   Pulmonary:     Effort: Pulmonary effort is normal.     Breath sounds: Normal breath  sounds. No stridor. No wheezing or rhonchi.  Abdominal:     General: Abdomen is protuberant. Bowel sounds are normal.     Palpations: There is no hepatomegaly or splenomegaly.     Tenderness: There is no abdominal tenderness.  Musculoskeletal:     Right lower leg: Edema (trace) present.     Left lower leg: 1+ Edema present.  Lymphadenopathy:     Cervical: No cervical adenopathy.  Skin:    General: Skin is warm and dry.     Findings: No rash.  Neurological:     General: No focal deficit present.     Mental Status: She is oriented to person, place, and time.  Psychiatric:        Mood and Affect: Mood normal.     Lab Results  Component Value Date   WBC 8.7 06/08/2019   HGB 9.9 (L) 06/08/2019   HCT 28.3 (L) 06/08/2019   PLT 207.0 06/08/2019   GLUCOSE 97 06/08/2019   CHOL 120 06/08/2019   TRIG 104.0 06/08/2019   HDL 53.90 06/08/2019   LDLDIRECT 56.0 07/08/2018   LDLCALC 45 06/08/2019   ALT 12 07/08/2018   AST 15 07/08/2018   NA 141 06/08/2019   K 4.5 06/08/2019   CL 105 06/08/2019   CREATININE 1.28 (H) 06/08/2019   BUN 23 06/08/2019   CO2 30 06/08/2019   TSH 1.10 07/08/2018   INR 1.66 12/02/2016   HGBA1C 4.8 06/08/2019    Dg Humerus Right  Result Date: 02/02/2019 CLINICAL DATA:  Injury to right upper arm.  Pain. EXAM: RIGHT HUMERUS - 2+ VIEW COMPARISON:  None. FINDINGS: Enthesopathic changes are associated with the internal and external humeral epicondyles. No fractures are seen. IMPRESSION: No fractures or dislocations. Electronically Signed   By: Dorise Bullion III M.D   On: 02/02/2019 08:41    Assessment & Plan:   Kenitha was seen today for anemia, hypertension and hyperlipidemia.  Diagnoses and all orders for this visit:  Essential hypertension- Her blood pressure is adequately well controlled but she complains of lower extremity edema.  I have asked her to start taking a loop diuretic. -     Basic metabolic panel; Future -     torsemide (DEMADEX) 20 MG  tablet; Take 1 tablet (20 mg total) by mouth daily.  Kidney disease, chronic, stage III (GFR 30-59 ml/min) ([place])- Her renal function is stable. -     CBC with Differential/Platelet; Future -     Basic metabolic panel; Future  Hyperlipidemia with target LDL less than 130 - Statin therapy is not indicated. -     Lipid panel; Future  Acquired hemolytic anemia ([place])- Her H&H are relatively stable.  There is no evidence of hemolysis. -     CBC with Differential/Platelet; Future  Zinc  deficiency- Her zinc level is normal now. -     CBC with Differential/Platelet; Future -     Zinc; Future  Hyperglycemia- Her blood sugars are normal now. -     Hemoglobin A1c; Future  Dysuria- Her urine culture is negative for infection. -     Urinalysis, Routine w reflex microscopic; Future -     CULTURE, URINE COMPREHENSIVE; Future  Gastroesophageal reflux disease without esophagitis- Her symptoms are well controlled. -     pantoprazole (PROTONIX) 40 MG tablet; Take 1 tablet (40 mg total) by mouth daily.  Bilateral leg edema -     torsemide (DEMADEX) 20 MG tablet; Take 1 tablet (20 mg total) by mouth daily.  Left ventricular diastolic dysfunction, NYHA class 1- She is mildly symptomatic with lower extremity edema so I have asked her to start taking loop diuretic.   I have discontinued [name]. Kochel's Multiple Vitamins-Minerals (CENTRUM SILVER PO), torsemide, and traMADol. I am also having her start on torsemide. Additionally, I am having her maintain her vitamin C, cholecalciferol, IRON PO, zinc gluconate, Calcium Carb-Cholecalciferol (CALCIUM CARBONATE-VITAMIN D3 PO), carvedilol, glucosamine-chondroitin, meloxicam, and pantoprazole. We will stop administering methylPREDNISolone acetate.  Meds ordered this encounter  Medications  . pantoprazole (PROTONIX) 40 MG tablet    Sig: Take 1 tablet (40 mg total) by mouth daily.    Dispense:  90 tablet    Refill:  1  . torsemide (DEMADEX) 20 MG tablet     Sig: Take 1 tablet (20 mg total) by mouth daily.    Dispense:  90 tablet    Refill:  1     Follow-up: Return in about 3 months (around 09/08/2019).  [name], MD

## 2019-06-08 NOTE — Patient Instructions (Signed)

## 2019-06-11 ENCOUNTER — Encounter: Payer: Self-pay | Admitting: Internal Medicine

## 2019-06-11 DIAGNOSIS — M1712 Unilateral primary osteoarthritis, left knee: Secondary | ICD-10-CM | POA: Diagnosis not present

## 2019-06-11 LAB — CULTURE, URINE COMPREHENSIVE
MICRO NUMBER:: 665646
SPECIMEN QUALITY:: ADEQUATE

## 2019-06-11 LAB — ZINC: Zinc: 70 ug/dL (ref 60–130)

## 2019-06-15 ENCOUNTER — Encounter: Payer: Self-pay | Admitting: Internal Medicine

## 2019-06-16 ENCOUNTER — Telehealth: Payer: Self-pay | Admitting: *Deleted

## 2019-06-16 ENCOUNTER — Telehealth: Payer: Self-pay | Admitting: Internal Medicine

## 2019-06-16 NOTE — Telephone Encounter (Signed)
   [facility] Pre-operative Risk Assessment    Request for surgical clearance:  1. What type of surgery is being performed? LEFT TOTAL KNEE ARTHROPLASTY   2. When is this surgery scheduled? 07/26/19  3. What type of clearance is required (medical clearance vs. Pharmacy clearance to hold med vs. Both)? MEDICAL  4. Are there any medications that need to be held prior to surgery and how long? NONE LISTED  5. Practice name and name of physician performing surgery? [facility]; DR. Wynelle Link  6. What is your office phone number [phone]   7.   What is your office fax number [phone]  8.   Anesthesia type (None, local, MAC, general) ? CHOICE   Julaine Hua 06/16/2019, 12:46 PM  _________________________________________________________________   (provider comments below)

## 2019-06-16 NOTE — Telephone Encounter (Addendum)
Attempted to reach pt by home number listed on file but there was no answer. Called multiple times but kept getting the busy signal. I called pt's daughter to see if she had a alternate number and she said the number listed was pt's only contact number. Daughter stated that she would try and reach out to patient and tell her to give our office a call.

## 2019-06-16 NOTE — Telephone Encounter (Signed)
No message needed °

## 2019-06-16 NOTE — Telephone Encounter (Signed)
   Primary Cardiologist:Paula Harrington Challenger, MD  Chart reviewed as part of pre-operative protocol coverage. Because of Nathali Vent Glaeser's past medical history and time since last visit, he/she will require a follow-up visit in order to better assess preoperative cardiovascular risk.  Pre-op covering staff: - Please schedule appointment and call patient to inform them. - Please contact requesting surgeon's office via preferred method (i.e, phone, fax) to inform them of need for appointment prior to surgery.  If applicable, this message will also be routed to pharmacy pool and/or primary cardiologist for input on holding anticoagulant/antiplatelet agent as requested below so that this information is available at time of patient's appointment.   Kathyrn Drown, NP  06/16/2019, 1:16 PM

## 2019-06-17 ENCOUNTER — Telehealth: Payer: Self-pay | Admitting: *Deleted

## 2019-06-17 NOTE — Telephone Encounter (Signed)
Pt is scheduled to see Vin Bhagat PA-C on 06/21/2019 for clearance

## 2019-06-17 NOTE — Telephone Encounter (Signed)

## 2019-06-18 ENCOUNTER — Telehealth: Payer: Self-pay | Admitting: *Deleted

## 2019-06-18 ENCOUNTER — Other Ambulatory Visit: Payer: Self-pay

## 2019-06-18 ENCOUNTER — Ambulatory Visit (INDEPENDENT_AMBULATORY_CARE_PROVIDER_SITE_OTHER)
Admission: RE | Admit: 2019-06-18 | Discharge: 2019-06-18 | Disposition: A | Discharge status: Home / Self Care | Payer: Medicare Other | Source: Ambulatory Visit | Attending: Pulmonary Disease | Admitting: Pulmonary Disease

## 2019-06-18 DIAGNOSIS — R0602 Shortness of breath: Secondary | ICD-10-CM | POA: Diagnosis not present

## 2019-06-18 DIAGNOSIS — R911 Solitary pulmonary nodule: Secondary | ICD-10-CM

## 2019-06-18 HISTORY — DX: Solitary pulmonary nodule: R91.1

## 2019-06-18 NOTE — Telephone Encounter (Signed)
Call place to pt to prescreen for appt 06/21/2019 for covid19, left a message for pt to call back.

## 2019-06-18 NOTE — Telephone Encounter (Signed)

## 2019-06-20 NOTE — Progress Notes (Addendum)
Cardiology Office Note    Date:  06/21/2019   ID:  Claudia Lara, Claudia Lara 1932-05-01, MRN [id number]  PCP:  Janith Lima, MD  Cardiologist:  Dr. Harrington Challenger  Chief Complaint: Surgical clearance left total knee arthoploasty  History of Present Illness:   Claudia Lara is a 85 y.o. female HTN, aortic stenosis and chronic dyspnea seen for surgical clearance.   Hx of chronic dyspnea. Echo 01/2018 showed LVEF of 65-70%, grade 1 DD, mild to moderate MR, PA pressure of 45 mm Hg.   Last seen by Dr. Harrington Challenger on 03/2018 for chronic dyspnea. Seen by Dr. Vaughan Browner for abnormal PFTS. No convincing evidence of ILD.   CT of chest 06/18/2019 enlarging pulmonary nodule>> concerning for malignancy> recommended biopsy and PET scan. Coronary artery disease. Has appointment with pulmonologist later this week.   Here today for surgical clearance.  Her chronic dyspnea on exertion is stable.  She does self propel push moving for 20 minutes/ week without rest.  Able to do household stuff without any issue.  She denies chest pain, palpitation, dizziness, orthopnea, PND, syncope, lower extremity edema or blood in her stool or urine.  She takes iron supplement for chronic anemia.    Past Medical History:  Diagnosis Date  . ANEMIA 08/22/2009  . CAROTID ARTERY STENOSIS 01/16/2010  . Complication of anesthesia    very hard to wake up from surgery  . DYSPNEA ON EXERTION 11/16/2007  . Gallstones   . GEN OSTEOARTHROSIS INVOLVING MULTIPLE SITES 11/11/2008  . GERD (gastroesophageal reflux disease)   . HEMORRHOIDS, INTERNAL    surgery was in the 90's (per patient)  . NECK PAIN, ACUTE 12/28/2009  . PERIPHERAL EDEMA 10/06/2007  . PVD 10/06/2007  . Unspecified essential hypertension 10/19/2007    Past Surgical History:  Procedure Laterality Date  . ABDOMINAL HYSTERECTOMY    . BUNIONECTOMY     bilat  . CARPAL TUNNEL RELEASE    . CHOLECYSTECTOMY  01/29/2016   at [facility]  . CHOLECYSTECTOMY    . ENDOSCOPIC  RETROGRADE CHOLANGIOPANCREATOGRAPHY (ERCP) WITH PROPOFOL N/A 12/01/2015   Procedure: ENDOSCOPIC RETROGRADE CHOLANGIOPANCREATOGRAPHY (ERCP) WITH PROPOFOL;  Surgeon: [name], MD;  Location: [facility];  Service: Endoscopy;  Laterality: N/A;  . ENDOSCOPIC RETROGRADE CHOLANGIOPANCREATOGRAPHY (ERCP) WITH PROPOFOL N/A 02/01/2016   Procedure: ENDOSCOPIC RETROGRADE CHOLANGIOPANCREATOGRAPHY (ERCP) WITH PROPOFOL;  Surgeon: [name], MD;  Location: [facility];  Service: Endoscopy;  Laterality: N/A;  . ENDOSCOPIC RETROGRADE CHOLANGIOPANCREATOGRAPHY (ERCP) WITH PROPOFOL N/A 04/07/2018   Procedure: ENDOSCOPIC RETROGRADE CHOLANGIOPANCREATOGRAPHY (ERCP) WITH PROPOFOL;  Surgeon: [name], MD;  Location: [facility];  Service: Endoscopy;  Laterality: N/A;  . HAMMER TOE SURGERY    . HEMORRHOID SURGERY    . Hyperplastic colon polyps, removed  2007   By Dr. Penelope Coop  . JOINT REPLACEMENT  2011   right knee  . OOPHORECTOMY    . REMOVAL OF STONES  04/07/2018   Procedure: REMOVAL OF STONES;  Surgeon: [name], MD;  Location: [facility];  Service: Endoscopy;;  . right hip replacement    . ROTATOR CUFF REPAIR  2004   left (Dr. Durward Fortes)  . SHOULDER ARTHROSCOPY    . SPHINCTEROTOMY  04/07/2018   Procedure: SPHINCTEROTOMY;  Surgeon: [name], MD;  Location: [facility];  Service: Endoscopy;;  . TOTAL HIP ARTHROPLASTY Right 11/26/2016   Procedure: RIGHT TOTAL HIP ARTHROPLASTY;  Surgeon: Garald Balding, MD;  Location: [facility];  Service: Orthopedics;  Laterality: Right;  .  TOTAL KNEE ARTHROPLASTY Right     Current Medications: Prior to Admission medications   Medication Sig Start Date End Date Taking? Authorizing Provider  Calcium Carb-Cholecalciferol (CALCIUM CARBONATE-VITAMIN D3 PO) Take by mouth.    [provider]  carvedilol (COREG) 12.5 MG tablet TAKE 1 TABLET (12.5 MG TOTAL) BY MOUTH 2 (TWO) TIMES DAILY WITH A MEAL. 01/21/19   Janith Lima, MD  cholecalciferol  (VITAMIN D) 1000 units tablet Take 1,000 Units by mouth daily.    [provider]  glucosamine-chondroitin 500-400 MG tablet Take 1 tablet by mouth 3 (three) times daily.    [provider]  IRON PO Take 65 mg by mouth daily.     [provider]  meloxicam (MOBIC) 7.5 MG tablet TAKE 1 TABLET BY MOUTH DAILY 04/16/19   Janith Lima, MD  pantoprazole (PROTONIX) 40 MG tablet Take 1 tablet (40 mg total) by mouth daily. 06/08/19   Janith Lima, MD  torsemide (DEMADEX) 20 MG tablet Take 1 tablet (20 mg total) by mouth daily. 06/08/19   Janith Lima, MD  vitamin C (ASCORBIC ACID) 500 MG tablet Take 500 mg by mouth daily.    [provider]  zinc gluconate 50 MG tablet Take 1 tablet (50 mg total) daily by mouth. Patient taking differently: Take 50 mg by mouth daily as needed.  10/03/17   Janith Lima, MD    Allergies:   Sulfonamide derivatives   Social History   Socioeconomic History  . Marital status: Divorced    Spouse name: Not on file  . Number of children: 1  . Years of education: 24  . Highest education level: Not on file  Occupational History  . 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 Financial resource strain: Not on file  . Food insecurity    Worry: Not on file    Inability: Not on file  . Transportation needs    Medical: Not on file    Non-medical: Not on file  Tobacco Use  . Smoking status: Never Smoker  . Smokeless tobacco: Never Used  Substance and Sexual Activity  . Alcohol use: No  . Drug use: No  . Sexual activity: Not Currently  Lifestyle  . Physical activity    Days per week: Not on file    Minutes per session: Not on file  . Stress: Not on file  Relationships  . Social Herbalist on phone: Not on file    Gets together: Not on file    Attends religious service: Not on file    Active member of club or organization: Not on file    Attends meetings of clubs or  organizations: Not on file    Relationship status: Not on file  Other Topics Concern  . Not on file  Social History Narrative   HSG. Married - divorced for many years.  Retired. Lives alone - very active and independent.     Family History:  The patient's family history includes Cancer in her brother, father, and mother; Leukemia in her father; Lung cancer in her father; Ovarian cancer in her mother.   ROS:   Please see the history of present illness.    ROS All other systems reviewed and are negative.   PHYSICAL EXAM:   VS:  BP 130/60   Pulse 78   Ht 5\' 3"  (1.6 m)   Wt 172 lb 6.4  oz (78.2 kg)   SpO2 96%   BMI 30.54 kg/m    GEN: Well nourished, well developed, in no acute distress  HEENT: normal  Neck: no JVD, carotid bruits, or masses Cardiac: RRR; no murmurs, rubs, or gallops, trace lower extremity edema left greater than right Respiratory:  clear to auscultation bilaterally, normal work of breathing GI: soft, nontender, nondistended, + BS MS: no deformity or atrophy  Skin: warm and dry, no rash Neuro:  Alert and Oriented x 3, Strength and sensation are intact Psych: euthymic mood, full affect  Wt Readings from Last 3 Encounters:  06/21/19 172 lb 6.4 oz (78.2 kg)  06/08/19 172 lb 8 oz (78.2 kg)  02/01/19 173 lb 8 oz (78.7 kg)      Studies/Labs Reviewed:   EKG:  EKG is ordered today.  The ekg ordered today demonstrates normal sinus rhythm at rate of 78 bpm  Recent Labs: 07/08/2018: ALT 12; TSH 1.10 06/08/2019: BUN 23; Creatinine, Ser 1.28; Hemoglobin 9.9; Platelets 207.0; Potassium 4.5; Sodium 141   Lipid Panel    Component Value Date/Time   CHOL 120 06/08/2019 1109   CHOL 123 08/14/2017 1143   TRIG 104.0 06/08/2019 1109   HDL 53.90 06/08/2019 1109   HDL 64 08/14/2017 1143   CHOLHDL 2 06/08/2019 1109   VLDL 20.8 06/08/2019 1109   LDLCALC 45 06/08/2019 1109   LDLCALC 45 08/14/2017 1143   LDLDIRECT 56.0 07/08/2018 1518    Additional studies/ records that  were reviewed today include:   Echocardiogram: 01/2018 Study Conclusions  - Left ventricle: The cavity size was normal. Wall thickness was   increased in a pattern of moderate LVH. Systolic function was   vigorous. The estimated ejection fraction was in the range of 65%   to 70%. Wall motion was normal; there were no regional wall   motion abnormalities. Doppler parameters are consistent with   abnormal left ventricular relaxation (grade 1 diastolic   dysfunction). The E/e&' ratio is >15, suggesting elevated LV   filling pressure. - Aortic valve: Trileaflet. Sclerosis without stenosis.   Transvalvular velocity was minimally increased. Mean gradient   (S): 12 mm Hg. - Mitral valve: Mildly thickened leaflets . There was mild to   moderate regurgitation. - Left atrium: The atrium was mildly dilated. - Tricuspid valve: There was mild regurgitation. - Pulmonary arteries: PA peak pressure: 45 mm Hg (S). - Inferior vena cava: The vessel was normal in size. The   respirophasic diameter changes were in the normal range (= 50%),   consistent with normal central venous pressure. - Pericardium, extracardiac: Features were not consistent with   tamponade physiology.  Impressions:  - Compared to a prior study in 2017, the LVEF is higher at 65-70%.   The aortic valve gradient is stable - the valve is sclerotic   without obvious stenosis.  CT of chest 06/18/2019 IMPRESSION: 1. There is redemonstrated, mild, nonspecific bibasilar interstitial septal thickening and ground-glass such partial atelectasis, not significantly changed compared to prior examination. No evidence of bronchiectasis, bronchiolectasis, or honeycombing. No significant air trapping on expiratory phase imaging. Generally favor nonspecific post infectious/inflammatory scarring with this appearance however minimal, early interstitial lung disease is not excluded. These findings are in an "indeterminate for UIP" pattern by  ATS pulmonary fibrosis criteria. Consider ongoing annual follow-up to evaluate for stability of pattern and fibrotic findings if fibrosing interstitial lung disease remains clinically suspected.  2. A right upper lobe pulmonary nodule has enlarged compared to prior examination, now  measuring 9 mm, previously 6-7 mm when measured similarly (series 5, image 136), and is concerning for malignancy. This nodule is at the threshold size for FDG PET characterization of metabolic activity. Percutaneous CT-guided biopsy can be considered.  3.  Coronary artery disease.  4.  Aortic atherosclerosis.  ASSESSMENT & PLAN:    1. HTN -Blood pressure stable on Coreg.  2. Abnormal Chest CT -Enlarging pulmonary nodules concerning for malignancy.  Has appointment with pulmonologist later this week.  3. Surgical clearance -Patient has chronic dyspnea likely due to pulmonary issue as above.  Patient denies any chest pain or symptoms concerning for CHF.  -She getting greater than 4 METS of activity.  Does self propel push mowing without stopping.  She is not on aspirin or statin therapy.  Coronary artery disease by CT of chest.  Will review with Dr. Harrington Challenger prior to clearance.  4.  Chronic bilateral lower extremity edema left greater than right -Stable for years.  On torsemide.  No orthopnea or PND.  Addendum:  Per Dr. Harrington Challenger:  "With no angina (I am not convinced chronic dyspnea represents anginal equivalent) I think the patient does not need any further cardiac testing prior to planned surgery   Rel low risk for major cardiac event.   (normal myovue in 2014). With coronary calcifications I would check place on statin and follow lipids   GOal LDL 70".  However LDL is 45 on 06/08/2019.    Medication Adjustments/Labs and Tests Ordered: Current medicines are reviewed at length with the patient today.  Concerns regarding medicines are outlined above.  Medication changes, Labs and Tests ordered today are  listed in the Patient Instructions below. Patient Instructions  Medication Instructions:  Your physician recommends that you continue on your current medications as directed. Please refer to the Current Medication list given to you today.   If you need a refill on your cardiac medications before your next appointment, please call your pharmacy.   Lab work: None ordered  If you have labs (blood work) drawn today and your tests are completely normal, you will receive your results only by: [name] MyChart Message (if you have MyChart) OR . A paper copy in the mail If you have any lab test that is abnormal or we need to change your treatment, we will call you to review the results.  Testing/Procedures: None ordered  Follow-Up: At [facility], you and your health needs are our priority.  As part of our continuing mission to provide you with exceptional heart care, we have created designated Provider Care Teams.  These Care Teams include your primary Cardiologist (physician) and Advanced Practice Providers (APPs -  Physician Assistants and Nurse Practitioners) who all work together to provide you with the care you need, when you need it. You will need a follow up appointment in:  5 months.  Please call our office 2 months in advance to schedule this appointment.  You may see Dorris Carnes, MD or one of the following Advanced Practice Providers on your designated Care Team: Richardson Dopp, PA-C [place], [place] . Daune Perch, NP  Any Other Special Instructions Will Be Listed Below (If Applicable).       Jarrett Soho, [place]  06/21/2019 10:36 AM    [facility] [place], [place], [place]  [address] Phone: [phone]; Fax: [phone]

## 2019-06-21 ENCOUNTER — Other Ambulatory Visit: Payer: Self-pay

## 2019-06-21 ENCOUNTER — Encounter: Payer: Self-pay | Admitting: Physician Assistant

## 2019-06-21 ENCOUNTER — Ambulatory Visit (INDEPENDENT_AMBULATORY_CARE_PROVIDER_SITE_OTHER): Payer: Medicare Other | Admitting: Physician Assistant

## 2019-06-21 VITALS — BP 130/60 | HR 78 | Ht 63.0 in | Wt 172.4 lb

## 2019-06-21 DIAGNOSIS — R0602 Shortness of breath: Secondary | ICD-10-CM

## 2019-06-21 DIAGNOSIS — I350 Nonrheumatic aortic (valve) stenosis: Secondary | ICD-10-CM

## 2019-06-21 DIAGNOSIS — R9389 Abnormal findings on diagnostic imaging of other specified body structures: Secondary | ICD-10-CM | POA: Diagnosis not present

## 2019-06-21 DIAGNOSIS — I10 Essential (primary) hypertension: Secondary | ICD-10-CM

## 2019-06-21 NOTE — Patient Instructions (Signed)
Medication Instructions:  Your physician recommends that you continue on your current medications as directed. Please refer to the Current Medication list given to you today.   If you need a refill on your cardiac medications before your next appointment, please call your pharmacy.   Lab work: None ordered  If you have labs (blood work) drawn today and your tests are completely normal, you will receive your results only by: [name] MyChart Message (if you have MyChart) OR . A paper copy in the mail If you have any lab test that is abnormal or we need to change your treatment, we will call you to review the results.  Testing/Procedures: None ordered  Follow-Up: At [facility], you and your health needs are our priority.  As part of our continuing mission to provide you with exceptional heart care, we have created designated Provider Care Teams.  These Care Teams include your primary Cardiologist (physician) and Advanced Practice Providers (APPs -  Physician Assistants and Nurse Practitioners) who all work together to provide you with the care you need, when you need it. You will need a follow up appointment in:  5 months.  Please call our office 2 months in advance to schedule this appointment.  You may see Dorris Carnes, MD or one of the following Advanced Practice Providers on your designated Care Team: Richardson Dopp, PA-C [place], [place] . Daune Perch, NP  Any Other Special Instructions Will Be Listed Below (If Applicable).

## 2019-06-22 NOTE — Progress Notes (Signed)
With no angina (I am not convinced chronic dyspnea represents anginal equivalent) I think the patient does not need any further cardiac testing prior to planned surgery   Rel low risk for major cardiac event.   (normal myovue in 2014) With coronary calcifications I would check place on statin and follow lipids   GOal LDL 70

## 2019-06-24 ENCOUNTER — Encounter: Payer: Self-pay | Admitting: Pulmonary Disease

## 2019-06-24 ENCOUNTER — Ambulatory Visit (INDEPENDENT_AMBULATORY_CARE_PROVIDER_SITE_OTHER): Payer: Medicare Other | Admitting: Pulmonary Disease

## 2019-06-24 ENCOUNTER — Other Ambulatory Visit: Payer: Self-pay

## 2019-06-24 VITALS — BP 118/70 | HR 76 | Temp 98.3°F | Ht 63.0 in | Wt 172.4 lb

## 2019-06-24 DIAGNOSIS — R911 Solitary pulmonary nodule: Secondary | ICD-10-CM | POA: Insufficient documentation

## 2019-06-24 DIAGNOSIS — R0602 Shortness of breath: Secondary | ICD-10-CM

## 2019-06-24 NOTE — Patient Instructions (Signed)
We will schedule you for a PET scan I will check with my partner about suitability for bronchoscopic biopsy of the lung nodule We will be in touch with you soon regarding this Follow-up in 2 to 4 weeks.

## 2019-06-24 NOTE — Addendum Note (Signed)
Addended by: Hildred Alamin I on: 06/24/2019 09:49 AM   Modules accepted: Orders

## 2019-06-24 NOTE — Progress Notes (Addendum)
Claudia Lara    [id number]    17-Jun-1932  Primary Care Physician:Jones, Arvid Right, MD  Referring Physician: Janith Lima, MD [address],  [place] [address]   Chief complaint: Follow-up for dyspnea, lung nodule  HPI: 85 year old with history of aortic stenosis, exertional dyspnea, arthritis. Complains of progressive dyspnea for the past 10 years.  Symptoms are mostly on exertion.  She does not have symptoms at rest, no cough, wheezing, mucus production. She follows with Dr. Harrington Challenger for mild aortic stenosis. Echo shows stable aortic stenosis with diastolic dysfunction, elevated filling pressures.  She had PFTs which showed isolated reduction in diffusion capacity and is referred to pulmonary for further evaluation  She has chronic arthritis of the DIP, IP and MP joints.  Denies morning stiffness.  No raynauds, rash, dysphagia, choking on food. She has had recurrent issues with gallstones, biliary obstruction status post cholecystectomy in 2017 and recently underwent ERCP on 3/9 and 04/07/18.  She reports that her symptoms of dyspnea improve whenever the gallstones are removed.  She has been on nitrofurantoin recently for cystitis but not on a prolonged basis.  Pets: No pets, birds, farm animals Occupation: Retired Network engineer Exposures: No relevant exposures.  No leak, hot tub, Jacuzzi.  No down pillows or comforters Smoking history: Never smoker.  Exposed to secondhand smoke as a child no significant travel Travel history: No significant travel Relevant family history: No significant family history of lung issues.  Interim history: Breathing is stable.  Has chronic dyspnea on exertion which is unchanged since last visit She is here for discussion of recent CT scan  Outpatient Encounter Medications as of 06/24/2019  Medication Sig  . Calcium Carb-Cholecalciferol (CALCIUM CARBONATE-VITAMIN D3 PO) Take by mouth.  . carvedilol (COREG) 12.5 MG tablet TAKE  1 TABLET (12.5 MG TOTAL) BY MOUTH 2 (TWO) TIMES DAILY WITH A MEAL.  . cholecalciferol (VITAMIN D) 1000 units tablet Take 1,000 Units by mouth daily.  . IRON PO Take 65 mg by mouth daily.   . meloxicam (MOBIC) 7.5 MG tablet TAKE 1 TABLET BY MOUTH DAILY  . pantoprazole (PROTONIX) 40 MG tablet Take 1 tablet (40 mg total) by mouth daily.  [name] torsemide (DEMADEX) 20 MG tablet Take 1 tablet (20 mg total) by mouth daily.  . vitamin C (ASCORBIC ACID) 500 MG tablet Take 500 mg by mouth daily.  [name] zinc gluconate 50 MG tablet Take 1 tablet (50 mg total) daily by mouth.   No facility-administered encounter medications on file as of 06/24/2019.    Physical Exam: Blood pressure 118/70, pulse 76, temperature 98.3 F (36.8 C), temperature source Oral, height 5\' 3"  (1.6 m), weight 172 lb 6.4 oz (78.2 kg), SpO2 98 %. Gen:      No acute distress HEENT:  EOMI, sclera anicteric Neck:     No masses; no thyromegaly Lungs:    Clear to auscultation bilaterally; normal respiratory effort CV:         Regular rate and rhythm; no murmurs Abd:      + bowel sounds; soft, non-tender; no palpable masses, no distension Ext:    No edema; adequate peripheral perfusion Skin:      Warm and dry; no rash Neuro: alert and oriented x 3 Psych: normal mood and affect  Data Reviewed: PFTs 04/03/2018 FVC 2.29 [95%], FEV1 1.79 [101%], F/F 78, TLC 86%, DLCO 51%, DLCO/VA 60% Isolated reduction in diffusion capacity.  Echocardiogram 02/18/2018- LVEF  65-70%, elevated LV filling pressure PA peak pressure 45, grade 1 diastolic dysfunction  CT chest 03/17/2018-mild atelectasis at the bases. Small left pleural effusion. High-resolution CT 05/08/2018- 7 mm nodule in the right upper lobe.  Minimal basilar groundglass attenuation, septal thickening. High-resolution CT 06/18/2019- stable interstitial changes at the bases.  Enlarging right upper lobe pulmonary nodule now measuring 9 mm I have reviewed the images personally.  Labs ANA, rheumatoid  factor, CCP 04/27/2018-negative  Assessment:  Evaluation for dyspnea, abnormal PFTs PFTs reviewed which show isolated reduction in diffusion capacity.  There is no restriction. I have reviewed the high-resolution CT which shows very minimal changes at the bases.  There is no convincing evidence of interstitial lung disease.  Serologies for ANA, rheumatoid factor, CCP are negative Repeat CT shows stable findings.  We will continue to monitor  Pulmonary nodule CT scan reviewed with enlarging pulmonary nodule We will get a PET scan to see if there is uptake anywhere else Even if PET is negative we may still need to get a biopsy as size of the nodule is the lower size limit of detection on PET Assess for navigational biopsy   More then 1/2 the time of the 40 min visit was spent in counseling and/or coordination of care with the patient and family.  Plan/Recommendations: - PET scan - Assess for navigational biopsy   Marshell Garfinkel MD [facility] 06/24/2019, 9:15 AM  CC:  Janith Lima, MD   Fay Records MD

## 2019-06-30 DIAGNOSIS — L820 Inflamed seborrheic keratosis: Secondary | ICD-10-CM | POA: Diagnosis not present

## 2019-06-30 DIAGNOSIS — L570 Actinic keratosis: Secondary | ICD-10-CM | POA: Diagnosis not present

## 2019-06-30 DIAGNOSIS — X32XXXD Exposure to sunlight, subsequent encounter: Secondary | ICD-10-CM | POA: Diagnosis not present

## 2019-06-30 IMAGING — MR MR ABDOMEN WO/W CM MRCP
14 of 21 series · 28 of 48 positions shown · IV contrast (18ml multihance)
Comparison: CT scan 03/17/2018.

CLINICAL DATA: Biliary dilatation with elevated LFTs.

EXAM:
MRI ABDOMEN WITHOUT AND WITH CONTRAST (INCLUDING MRCP)
TECHNIQUE: Multiplanar multisequence MR imaging of the abdomen was performed
both before and after the administration of intravenous contrast.
Heavily T2-weighted images of the biliary and pancreatic ducts were
obtained, and three-dimensional MRCP images were rendered by post
processing.
CONTRAST:  8mL MULTIHANCE GADOBENATE DIMEGLUMINE 529 MG/ML IV SOLN

[Series 5: T2 · coronal · 5.0mm · 0.68mm/px · 2 of 30 slices shown (1 of 4)]
[im 1/30]
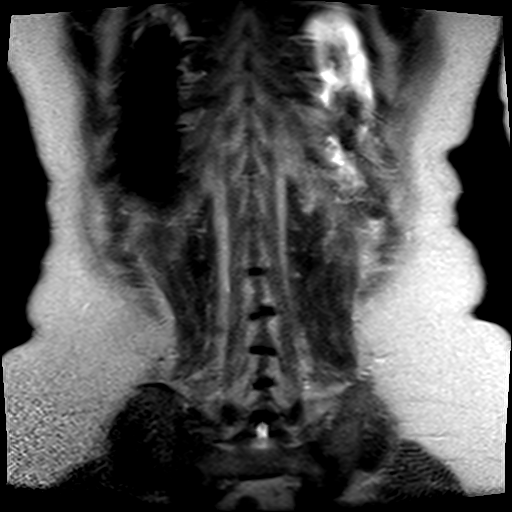
[im 30/30]
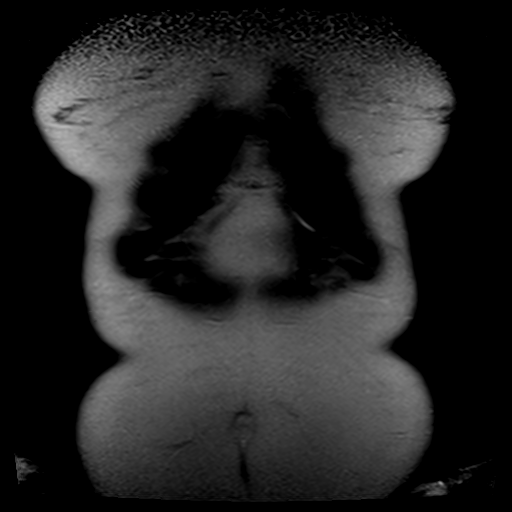

[Series 6: bSSFP · coronal · 5.0mm · 0.78mm/px · 1 of 29 slices shown (1 of 2)]
[im 1/29]
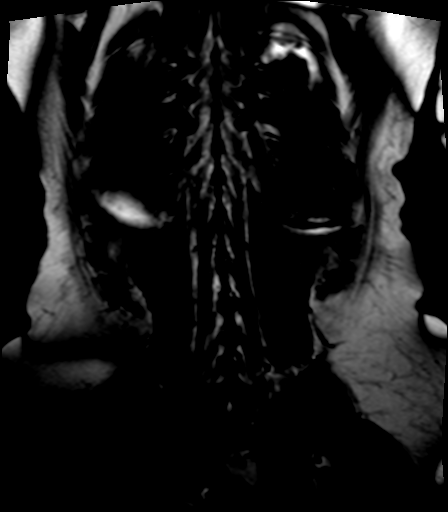

[Series 7: T2 · coronal · 3.0mm · 0.70mm/px · 2 of 52 slices shown (2 of 4)]
[im 1/52]
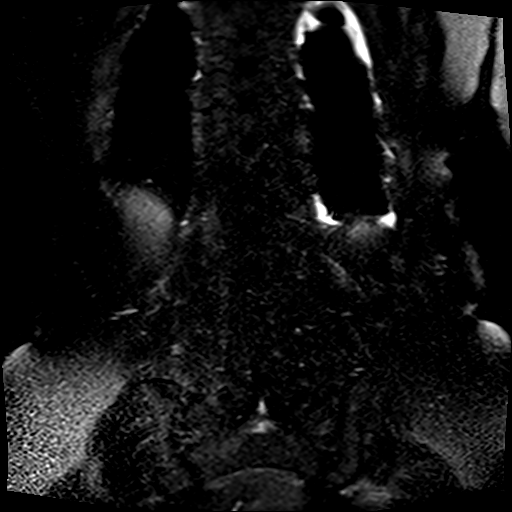
[im 52/52]
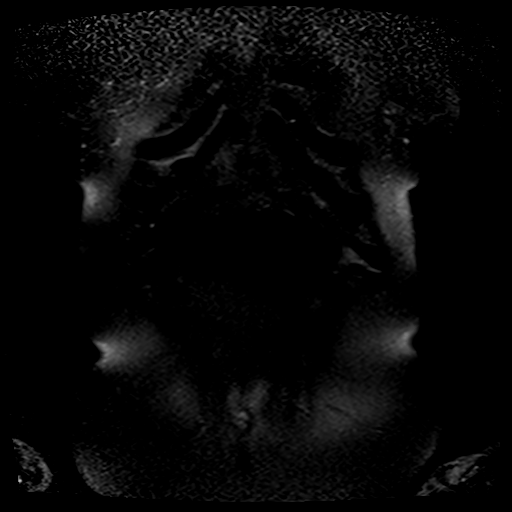

[Series 8: bSSFP · axial · 5.0mm · 0.78mm/px · 1 of 36 slices shown (2 of 2)]
[im 1/36]
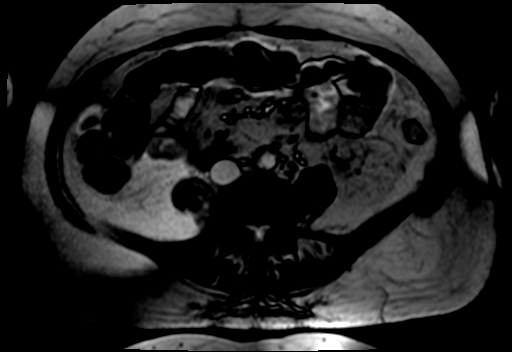

[Series 9: T2 · axial · 5.0mm · 0.74mm/px · 1 of 32 slices shown (3 of 4)]
[im 1/32]
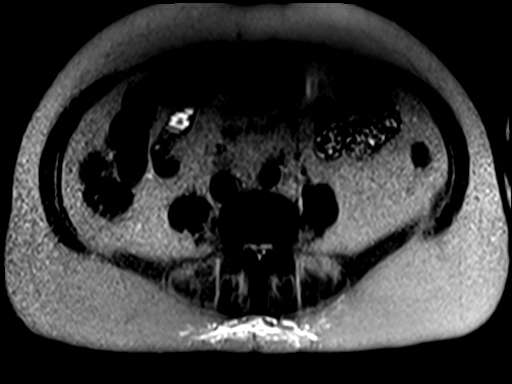

[Series 10: T1 · axial · 6.0mm · 0.74mm/px · z∈[-107,+88]mm · 2 of 52 slices shown]
[im 1/52]
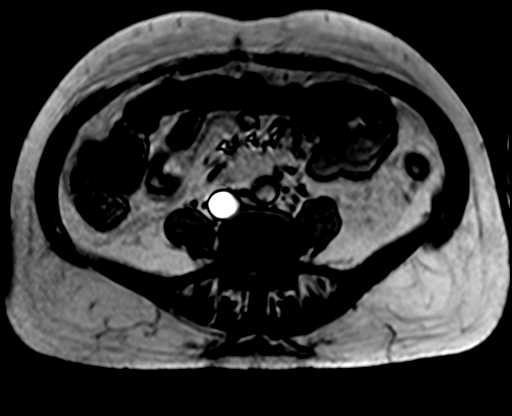
[im 52/52]
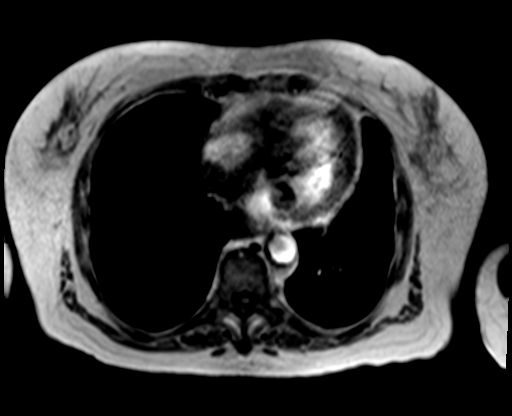

[Series 13: T2 · axial · 5.0mm · 0.94mm/px · 1 of 36 slices shown (4 of 4)]
[im 1/36]
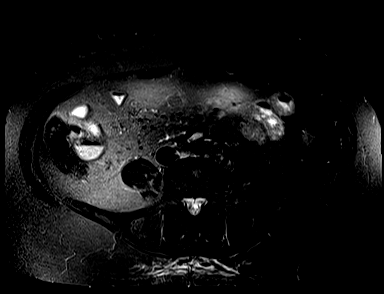

[Series 16: ep2d_diff_b50_500_800_p2_trig · axial · 5.0mm · 1.98mm/px · z∈[-108,+120]mm · 4 of 108 slices shown]
[im 1/108]
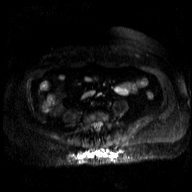
[im 36/108]
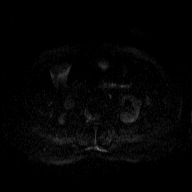
[im 72/108]
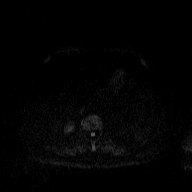
[im 108/108]
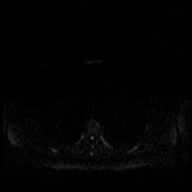

[Series 17: ep2d_diff_b50_500_800_p2_trig_adc · axial · 5.0mm · 1.98mm/px · 1 of 36 slices shown]
[im 1/36]
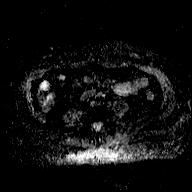

[Series 18: T1 dynamic · axial · non-contrast · 2.8mm · 0.78mm/px · z∈[-107,+88]mm · 3 of 72 slices shown]
[im 1/72]
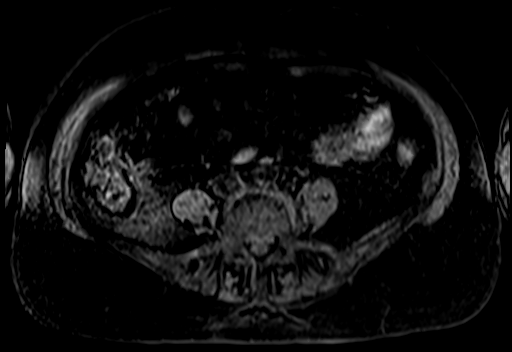
[im 36/72]
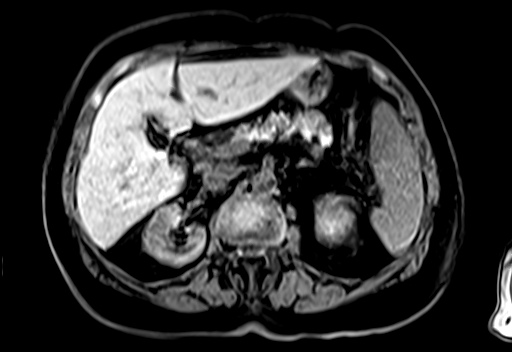
[im 72/72]
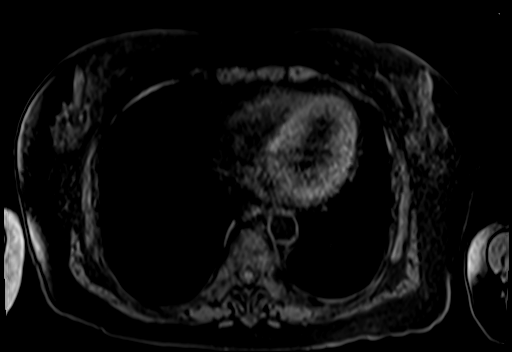

[Series 19: T1 dynamic post-contrast · axial · 2.8mm · 0.78mm/px · z∈[-107,+88]mm · 3 of 72 slices shown (1 of 4)]
[im 1/72]
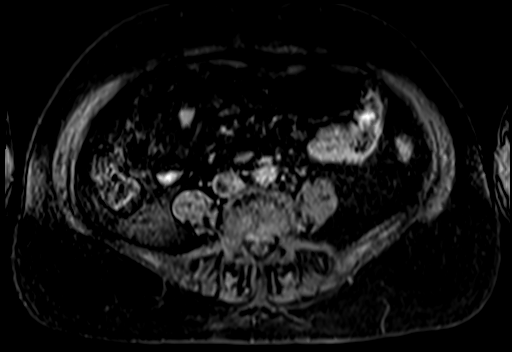
[im 36/72]
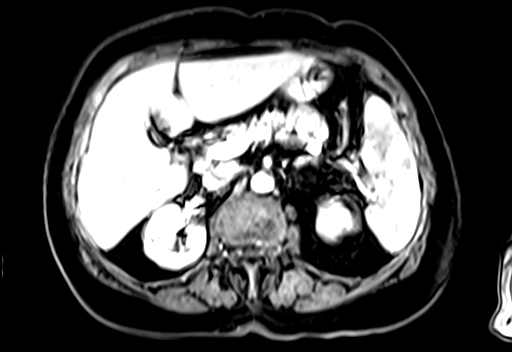
[im 72/72]
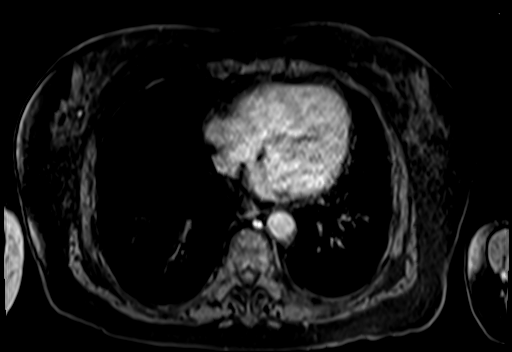

[Series 20: T1 dynamic post-contrast · axial · 2.8mm · 0.78mm/px · z∈[-107,+88]mm · 3 of 72 slices shown (2 of 4)]
[im 1/72]
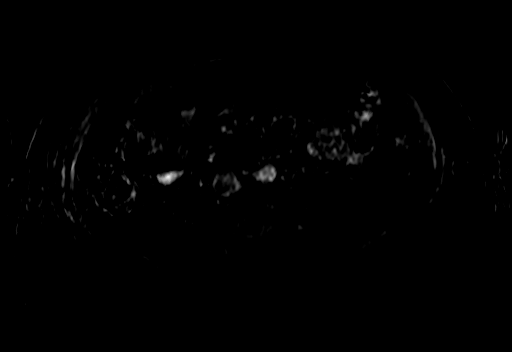
[im 36/72]
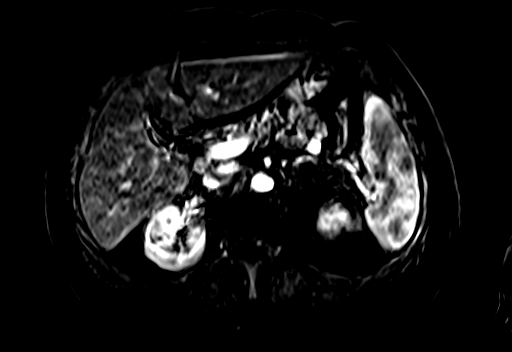
[im 72/72]
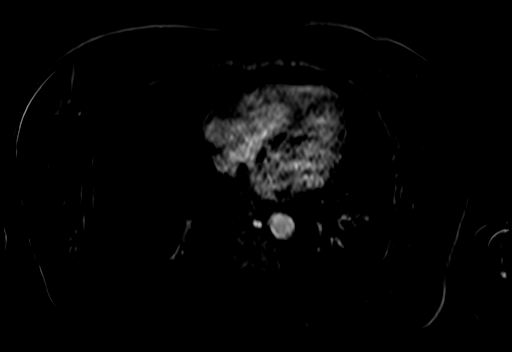

[Series 21: T1 dynamic post-contrast · axial · 2.8mm · 0.78mm/px · z∈[-107,+88]mm · 3 of 72 slices shown (3 of 4)]
[im 1/72]
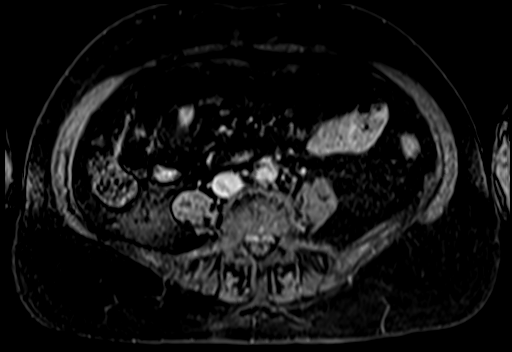
[im 36/72]
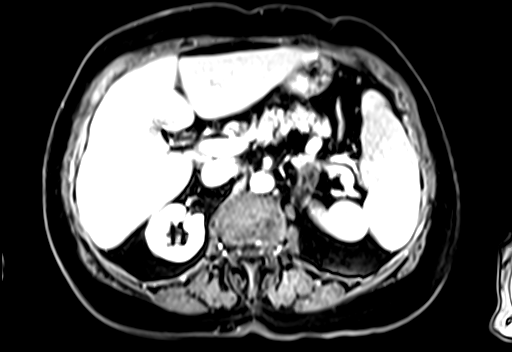
[im 72/72]
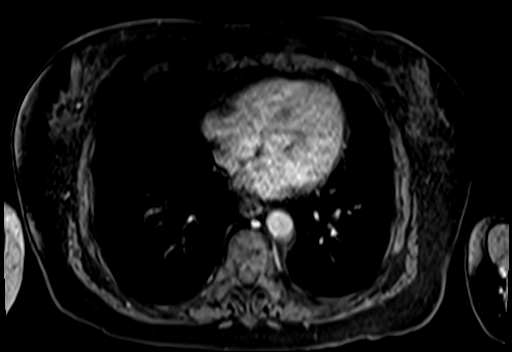

[Series 22: T1 dynamic post-contrast · axial · 2.8mm · 0.78mm/px · 1 of 72 slices shown (4 of 4)]
[im 1/72]
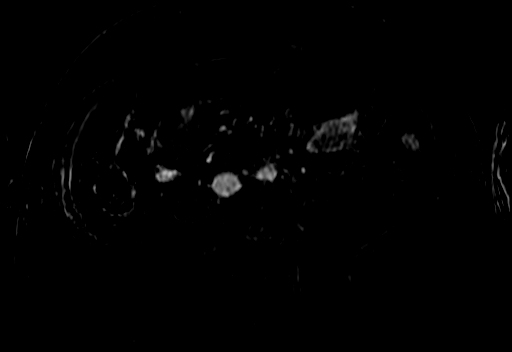

[28 of 48 positions shown; findings below may reference images not displayed]

FINDINGS: Lower chest: Unremarkable.

Hepatobiliary: No focal abnormality within the liver parenchyma.
Patient is status post cholecystectomy. Extrahepatic common duct
measures 8 mm diameter. Common bile duct in the head of the pancreas
measures 5-6 mm diameter. 9 x 3 mm filling defect in the common duct
has a geometric rectangular shape, but is most likely a ductal stone
(axial image 18 of T2 haste sequence 9). Tiny round filling defects
are seen in the distal common bile duct (images 23 and 24 of axial
haste sequence 9).

Pancreas: No focal mass lesion. No dilatation of the main duct. No
intraparenchymal cyst. No peripancreatic edema.

Spleen:  No splenomegaly. No focal mass lesion.

Adrenals/Urinary Tract: No right adrenal nodule or mass. 9 mm left
adrenal shows loss of signal medially, compatible with adenoma.

Stomach/Bowel: Stomach is nondistended. No gastric wall thickening.
No evidence of outlet obstruction. Duodenum is normally positioned
as is the ligament of Treitz. No small bowel or colonic dilatation
within the visualized abdomen.

Vascular/Lymphatic: No abdominal aortic aneurysm. There is no
gastrohepatic or hepatoduodenal ligament lymphadenopathy. No
intraperitoneal or retroperitoneal lymphadenopathy..

Other:  No intraperitoneal free fluid.

Musculoskeletal: Insert no abnormal marrow signal within the
visualized bony anatomy.
IMPRESSION: 1. 9 x 3 mm geometric filling defect in the common duct compatible
with a stone. There are probably very tiny stones in the distal
common bile duct just proximal to the ampulla.
2. Status post cholecystectomy.

## 2019-07-02 ENCOUNTER — Other Ambulatory Visit: Payer: Self-pay

## 2019-07-02 ENCOUNTER — Ambulatory Visit (HOSPITAL_COMMUNITY)
Admission: RE | Admit: 2019-07-02 | Discharge: 2019-07-02 | Disposition: A | Discharge status: Home / Self Care | Payer: Medicare Other | Source: Ambulatory Visit | Attending: Pulmonary Disease | Admitting: Pulmonary Disease

## 2019-07-02 DIAGNOSIS — I700 Atherosclerosis of aorta: Secondary | ICD-10-CM | POA: Diagnosis not present

## 2019-07-02 DIAGNOSIS — J90 Pleural effusion, not elsewhere classified: Secondary | ICD-10-CM | POA: Insufficient documentation

## 2019-07-02 DIAGNOSIS — Z79899 Other long term (current) drug therapy: Secondary | ICD-10-CM | POA: Insufficient documentation

## 2019-07-02 DIAGNOSIS — R911 Solitary pulmonary nodule: Secondary | ICD-10-CM | POA: Diagnosis not present

## 2019-07-02 LAB — GLUCOSE, CAPILLARY: Glucose-Capillary: 115 mg/dL — ABNORMAL HIGH (ref 70–99)

## 2019-07-02 MED ORDER — FLUDEOXYGLUCOSE F - 18 (FDG) INJECTION
8.5800 | Freq: Once | INTRAVENOUS | Status: AC | PRN
  Administered 2019-07-02: 10:00:00 8.58 via INTRAVENOUS

## 2019-07-06 ENCOUNTER — Telehealth: Payer: Self-pay | Admitting: Pulmonary Disease

## 2019-07-06 DIAGNOSIS — R911 Solitary pulmonary nodule: Secondary | ICD-10-CM

## 2019-07-06 NOTE — Telephone Encounter (Signed)
PET scan does not show uptake in the nodule I called and discussed with patient.  Reviewed options including repeating CT scan or going for biopsy.  Patient prefers to follow-up with CT scan  Please order CT scan without contrast in 6 months for lung nodule follow-up.

## 2019-07-06 NOTE — Telephone Encounter (Signed)
Dr. Vaughan Browner, [name] is requesting the results of her PET Scan performed on 07/02/2019. Please advise, thank you.

## 2019-07-06 NOTE — Telephone Encounter (Signed)
Called and spoke with pt letting her know that I was going to place order for the CT to be performed in 6 months and pt verbalized understanding. Order has been placed. Nothing further needed.

## 2019-07-07 NOTE — H&P (Signed)
TOTAL KNEE ADMISSION H&P  Patient is being admitted for left total knee arthroplasty.  Subjective:  Chief Complaint:left knee pain.  HPI: Claudia Lara, 85 y.o. female, has a history of pain and functional disability in the left knee due to arthritis and has failed non-surgical conservative treatments for greater than 12 weeks to includecorticosteriod injections, viscosupplementation injections and activity modification.  Onset of symptoms was gradual, starting 1 years ago with gradually worsening course since that time. The patient noted no past surgery on the left knee(s).  Patient currently rates pain in the left knee(s) at 10 out of 10 with activity. Patient has worsening of pain with activity and weight bearing and pain that interferes with activities of daily living.  Patient has evidence of bone on bone degenerative changes in the medial and patellofemoral compartments. by imaging studies.There is no active infection.  Patient Active Problem List   Diagnosis Date Noted  . Lung nodule 06/24/2019  . Bilateral leg edema 06/08/2019  . Zinc deficiency 10/03/2017  . Spondylosis, lumbar, with myelopathy 04/09/2017  . Hyperglycemia 03/05/2016  . GERD (gastroesophageal reflux disease) 08/03/2015  . Hemolytic anemia ([place]) 07/12/2015  . Dysuria 05/15/2015  . Chronic venous insufficiency 08/10/2014  . Aortic stenosis, mild 06/29/2014  . Left ventricular diastolic dysfunction, NYHA class 1 06/29/2014  . Senile osteopenia 04/21/2014  . Hyperlipidemia with target LDL less than 130 04/21/2014  . Kidney disease, chronic, stage III (GFR 30-59 ml/min) ([place]) 04/21/2014  . Routine health maintenance 07/16/2012  . Carotid artery stenosis without cerebral infarction 01/16/2010  . Essential hypertension 10/19/2007   Past Medical History:  Diagnosis Date  . ANEMIA 08/22/2009  . CAROTID ARTERY STENOSIS 01/16/2010  . Complication of anesthesia    very hard to wake up from surgery  . DYSPNEA ON  EXERTION 11/16/2007  . Gallstones   . GEN OSTEOARTHROSIS INVOLVING MULTIPLE SITES 11/11/2008  . GERD (gastroesophageal reflux disease)   . HEMORRHOIDS, INTERNAL    surgery was in the 90's (per patient)  . NECK PAIN, ACUTE 12/28/2009  . PERIPHERAL EDEMA 10/06/2007  . PVD 10/06/2007  . Unspecified essential hypertension 10/19/2007    Past Surgical History:  Procedure Laterality Date  . ABDOMINAL HYSTERECTOMY    . BUNIONECTOMY     bilat  . CARPAL TUNNEL RELEASE    . CHOLECYSTECTOMY  01/29/2016   at [facility]  . CHOLECYSTECTOMY    . ENDOSCOPIC RETROGRADE CHOLANGIOPANCREATOGRAPHY (ERCP) WITH PROPOFOL N/A 12/01/2015   Procedure: ENDOSCOPIC RETROGRADE CHOLANGIOPANCREATOGRAPHY (ERCP) WITH PROPOFOL;  Surgeon: [name], MD;  Location: [facility];  Service: Endoscopy;  Laterality: N/A;  . ENDOSCOPIC RETROGRADE CHOLANGIOPANCREATOGRAPHY (ERCP) WITH PROPOFOL N/A 02/01/2016   Procedure: ENDOSCOPIC RETROGRADE CHOLANGIOPANCREATOGRAPHY (ERCP) WITH PROPOFOL;  Surgeon: [name], MD;  Location: [facility];  Service: Endoscopy;  Laterality: N/A;  . ENDOSCOPIC RETROGRADE CHOLANGIOPANCREATOGRAPHY (ERCP) WITH PROPOFOL N/A 04/07/2018   Procedure: ENDOSCOPIC RETROGRADE CHOLANGIOPANCREATOGRAPHY (ERCP) WITH PROPOFOL;  Surgeon: [name], MD;  Location: [facility];  Service: Endoscopy;  Laterality: N/A;  . HAMMER TOE SURGERY    . HEMORRHOID SURGERY    . Hyperplastic colon polyps, removed  2007   By Dr. Penelope Coop  . JOINT REPLACEMENT  2011   right knee  . OOPHORECTOMY    . REMOVAL OF STONES  04/07/2018   Procedure: REMOVAL OF STONES;  Surgeon: [name], MD;  Location: [facility];  Service: Endoscopy;;  . right hip replacement    . ROTATOR CUFF REPAIR  2004  left (Dr. Durward Fortes)  . SHOULDER ARTHROSCOPY    . SPHINCTEROTOMY  04/07/2018   Procedure: SPHINCTEROTOMY;  Surgeon: [name], MD;  Location: [facility];  Service: Endoscopy;;  . TOTAL HIP ARTHROPLASTY  Right 11/26/2016   Procedure: RIGHT TOTAL HIP ARTHROPLASTY;  Surgeon: Garald Balding, MD;  Location: [place];  Service: Orthopedics;  Laterality: Right;  . TOTAL KNEE ARTHROPLASTY Right     No current facility-administered medications for this encounter.    Current Outpatient Medications  Medication Sig Dispense Refill Last Dose  . Calcium Carb-Cholecalciferol (CALCIUM CARBONATE-VITAMIN D3 PO) Take by mouth.   Taking  . carvedilol (COREG) 12.5 MG tablet TAKE 1 TABLET (12.5 MG TOTAL) BY MOUTH 2 (TWO) TIMES DAILY WITH A MEAL. 180 tablet 1 Taking  . cholecalciferol (VITAMIN D) 1000 units tablet Take 1,000 Units by mouth daily.   Taking  . IRON PO Take 65 mg by mouth daily.    Taking  . meloxicam (MOBIC) 7.5 MG tablet TAKE 1 TABLET BY MOUTH DAILY 90 tablet 0 Taking  . pantoprazole (PROTONIX) 40 MG tablet Take 1 tablet (40 mg total) by mouth daily. 90 tablet 1 Taking  . torsemide (DEMADEX) 20 MG tablet Take 1 tablet (20 mg total) by mouth daily. 90 tablet 1 Taking  . vitamin C (ASCORBIC ACID) 500 MG tablet Take 500 mg by mouth daily.   Taking  . zinc gluconate 50 MG tablet Take 1 tablet (50 mg total) daily by mouth. 90 tablet 1 Taking   Allergies  Allergen Reactions  . Sulfonamide Derivatives Rash    Childhood reaction    Social History   Tobacco Use  . Smoking status: Never Smoker  . Smokeless tobacco: Never Used  Substance Use Topics  . Alcohol use: No    Family History  Problem Relation Age of Onset  . Ovarian cancer Mother   . Cancer Mother   . Leukemia Father   . Lung cancer Father   . Cancer Father   . Cancer Brother   . Diabetes Neg Hx   . Heart disease Neg Hx   . Hypertension Neg Hx      Review of Systems  Constitutional: Negative for chills and fever.  Respiratory: Negative for cough.   Cardiovascular: Negative for chest pain and palpitations.  Gastrointestinal: Negative for nausea and vomiting.  Musculoskeletal: Positive for joint pain.  Neurological: Negative  for tingling and sensory change.   Objective:  Physical Exam Patient is an 85 year old female.  Well nourished and well developed. General: Alert and oriented x3, cooperative and pleasant, no acute distress. Head: normocephalic, atraumatic, neck supple. Eyes: EOMI. Respiratory: breath sounds clear in all fields, no wheezing, rales, or rhonchi. Cardiovascular: Regular rate and rhythm, no murmurs, gallops or rubs. Abdomen: non-tender to palpation and soft, normoactive bowel sounds.  Musculoskeletal: Her left knee shows no effusion. There is periarticular soft tissue swelling Range 5-115 with moderate crepitus There is medial greater than lateral tenderness There is no instability  Calves soft and nontender. Motor function intact in LE. Strength 5/5 LE bilaterally. Neuro: Distal pulses 2+. Sensation to light touch intact in LE. Vital signs in last 24 hours: Labs:  Estimated body mass index is 30.54 kg/m as calculated from the following:   Height as of 06/24/19: 5\' 3"  (1.6 m).   Weight as of 06/24/19: 78.2 kg.   Imaging Review Plain radiographs demonstrate severe degenerative joint disease of the left knee(s). The overall alignment isneutral. The bone quality appears to be  adequate for age and reported activity level.  Assessment/Plan:  End stage arthritis, left knee   The patient history, physical examination, clinical judgment of the provider and imaging studies are consistent with end stage degenerative joint disease of the left knee(s) and total knee arthroplasty is deemed medically necessary. The treatment options including medical management, injection therapy arthroscopy and arthroplasty were discussed at length. The risks and benefits of total knee arthroplasty were presented and reviewed. The risks due to aseptic loosening, infection, stiffness, patella tracking problems, thromboembolic complications and other imponderables were discussed. The patient acknowledged the  explanation, agreed to proceed with the plan and consent was signed. Patient is being admitted for inpatient treatment for surgery, pain control, PT, OT, prophylactic antibiotics, VTE prophylaxis, progressive ambulation and ADL's and discharge planning. The patient is planning to be discharged home.  Therapy Plans: Patient wishes to do HHPT then outpatient therapy at [facility] at [facility] Disposition: Home alone, but friends checking in on her. Planned DVT Prophylaxis: aspirin 325mg  BID DME needed: none PCP: Dr. Ronnald Ramp Cardiologist: Dr. Harrington Challenger Pulmonologist: Dr. Vaughan Browner TXA: IV Allergies: NKDA Anesthesia Concerns: nausea/vomiting, slow to wake up BMI: 29.4  Other: Hx of aortic stenosis and chronic dyspnea. Reports history of blood transfusions with previous THA and TKA, history of anemia. Recent evaluation for enlarging pulmonary nodules, plan to repeat CT in 6 months. Chronic lower extremity edema L>R.  Anticipated LOS equal to or greater than 2 midnights due to - Age 72 and older with one or more of the following:  - Obesity  - Expected need for hospital services (PT, OT, Nursing) required for safe  discharge  - Anticipated need for postoperative skilled nursing care or inpatient rehab  - Active co-morbidities: Coronary Artery Disease and Anemia OR   - Unanticipated findings during/Post Surgery: None  - Patient is a high risk of re-admission due to: None   - Patient was instructed on what medications to stop prior to surgery. - Follow-up visit in 2 weeks with Dr. Wynelle Link - Begin physical therapy following surgery - Pre-operative lab work as pre-surgical testing - Prescriptions will be provided in hospital at time of discharge  Griffith Citron, PA-C Orthopedic Surgery [facility] [place]

## 2019-07-15 ENCOUNTER — Encounter: Payer: Self-pay | Admitting: Pulmonary Disease

## 2019-07-15 ENCOUNTER — Other Ambulatory Visit: Payer: Self-pay

## 2019-07-15 ENCOUNTER — Ambulatory Visit (INDEPENDENT_AMBULATORY_CARE_PROVIDER_SITE_OTHER): Payer: Medicare Other | Admitting: Pulmonary Disease

## 2019-07-15 VITALS — BP 130/68 | HR 68 | Temp 96.5°F | Ht 63.0 in | Wt 172.2 lb

## 2019-07-15 DIAGNOSIS — R911 Solitary pulmonary nodule: Secondary | ICD-10-CM

## 2019-07-15 DIAGNOSIS — R0602 Shortness of breath: Secondary | ICD-10-CM | POA: Diagnosis not present

## 2019-07-15 NOTE — Patient Instructions (Signed)
Glad you are doing well with regard to your breathing.  I have reviewed the PET scan with you which does not show any activity. Per our discussion we will follow-up with CT scan in 6 months time for reevaluation of the lung nodule We will get some blood test today as well for further information with a lung nodule  Follow-up in clinic in 6 months.

## 2019-07-15 NOTE — Progress Notes (Addendum)
Claudia Lara    NL:[id number]    1932/06/22  Primary Care Physician:Jones, Arvid Right, MD  Referring Physician: Janith Lima, MD [address],  [place] [address]   Chief complaint: Follow-up for dyspnea, lung nodule  HPI: 85 year old with history of aortic stenosis, exertional dyspnea, arthritis. Complains of progressive dyspnea for the past 10 years.  Symptoms are mostly on exertion.  She does not have symptoms at rest, no cough, wheezing, mucus production. She follows with Dr. Harrington Challenger for mild aortic stenosis. Echo shows stable aortic stenosis with diastolic dysfunction, elevated filling pressures.  She had PFTs which showed isolated reduction in diffusion capacity and is referred to pulmonary for further evaluation  She has chronic arthritis of the DIP, IP and MP joints.  Denies morning stiffness.  No raynauds, rash, dysphagia, choking on food. She has had recurrent issues with gallstones, biliary obstruction status post cholecystectomy in 2017 and recently underwent ERCP on 3/9 and 04/07/18.  She reports that her symptoms of dyspnea improve whenever the gallstones are removed.  She has been on nitrofurantoin recently for cystitis but not on a prolonged basis.  Pets: No pets, birds, farm animals Occupation: Retired Network engineer Exposures: No relevant exposures.  No leak, hot tub, Jacuzzi.  No down pillows or comforters Smoking history: Never smoker.  Exposed to secondhand smoke as a child no significant travel Travel history: No significant travel Relevant family history: No significant family history of lung issues.  Interim history: States that breathing is stable.  Has chronic dyspnea on exertion Here for discussion of recent CT and PET scan.  Outpatient Encounter Medications as of 07/15/2019  Medication Sig  . Calcium Carb-Cholecalciferol (CALCIUM CARBONATE-VITAMIN D3 PO) Take by mouth.  . carvedilol (COREG) 12.5 MG tablet TAKE 1 TABLET (12.5 MG  TOTAL) BY MOUTH 2 (TWO) TIMES DAILY WITH A MEAL.  . cholecalciferol (VITAMIN D) 1000 units tablet Take 1,000 Units by mouth daily.  . IRON PO Take 65 mg by mouth daily.   . meloxicam (MOBIC) 7.5 MG tablet TAKE 1 TABLET BY MOUTH DAILY  . pantoprazole (PROTONIX) 40 MG tablet Take 1 tablet (40 mg total) by mouth daily.  [name] torsemide (DEMADEX) 20 MG tablet Take 1 tablet (20 mg total) by mouth daily.  . vitamin C (ASCORBIC ACID) 500 MG tablet Take 500 mg by mouth daily.  [name] zinc gluconate 50 MG tablet Take 1 tablet (50 mg total) daily by mouth.   No facility-administered encounter medications on file as of 07/15/2019.    Physical Exam:   Data Reviewed: Imaging: CT chest 03/17/2018-mild atelectasis at the bases. Small left pleural effusion. High-resolution CT 05/08/2018- 7 mm nodule in the right upper lobe.  Minimal basilar groundglass attenuation, septal thickening.  High-resolution CT 06/18/2019- stable interstitial changes at the bases.  Enlarging right upper lobe pulmonary nodule now measuring 9 mm  PET scan 07/02/2019- no uptake in the right upper lobe nodule SUV 0.9 or anywhere else I have reviewed the images personally.  PFTs 04/03/2018 FVC 2.29 [95%], FEV1 1.79 [101%], F/F 78, TLC 86%, DLCO 51%, DLCO/VA 60% Isolated reduction in diffusion capacity.  Echocardiogram 02/18/2018- LVEF 65-70%, elevated LV filling pressure PA peak pressure 45, grade 1 diastolic dysfunction  Labs ANA, rheumatoid factor, CCP 04/27/2018-negative  Assessment:  Evaluation for dyspnea, abnormal PFTs PFTs reviewed which show isolated reduction in diffusion capacity.  There is no restriction. I have reviewed the high-resolution CT which shows very minimal  changes at the bases.  There is no convincing evidence of interstitial lung disease.  Serologies for ANA, rheumatoid factor, CCP are negative We will continue to monitor  Pulmonary nodule CT scan reviewed with slightly enlarged pulmonary nodule PET scan is  negative though this may be limited by the small size of the nodule Discussed in detail with patient about going for biopsy or watchful waiting.  She would prefer to get a repeat CT and not go for the biopsy We will get the Biodesix nodify XL2 study to further stratify risk of malignancy Follow-up CT without contrast in 6 months  More then 1/2 the time of the 40 min visit was spent in counseling and/or coordination of care with the patient and family.  Plan/Recommendations: - CT without contrast in 6 months - Biodesix XLT2 test   Marshell Garfinkel MD [facility] 07/15/2019, 10:37 AM  CC:  Janith Lima, MD   Dorris Carnes V MD  Addendum: Sampson Goon XL2 Test 07/15/2019 Pre test-17% risk of malignancy Posttest 5%, reduced risk of malignancy  Marshell Garfinkel MD [facility] 01/12/2020, 1:04 PM

## 2019-07-16 ENCOUNTER — Telehealth: Payer: Self-pay | Admitting: Pulmonary Disease

## 2019-07-16 NOTE — Telephone Encounter (Signed)
Returned call to [facility] regarding lung nodule measurement.  Read Chest nodule findings off PET scan from 07/02/19. Claudia Lara states the measurement meets criteria and nothing further needed.

## 2019-07-17 ENCOUNTER — Other Ambulatory Visit: Payer: Self-pay | Admitting: Internal Medicine

## 2019-07-20 ENCOUNTER — Encounter (HOSPITAL_COMMUNITY): Payer: Self-pay

## 2019-07-20 NOTE — Patient Instructions (Addendum)
DUE TO COVID-19 ONLY ONE VISITOR IS ALLOWED TO COME WITH YOU AND STAY IN THE WAITING ROOM ONLY DURING PRE OP AND PROCEDURE. THE ONE VISITOR MAY VISIT WITH YOU IN YOUR PRIVATE ROOM DURING VISITING HOURS ONLY!!   COVID SWAB TESTING MUST BE COMPLETED SE:[id number], Aug. 27, 2020 at 1:05PM.     [address] [place] [facility] enter pre surgical testing line (Must self quarantine after testing. Follow instructions on handout.)             Your procedure is scheduled on: Monday, Aug. 31, 2020   Report to [facility] Main  Entrance    Report to admitting at 6:45 AM   Call this number if you have problems the morning of surgery [phone]   Do not eat food:After Midnight.   May have liquids until 6:15 AM day of surgery   CLEAR LIQUID DIET  Foods Allowed                                                                     Foods Excluded  Water, Black Coffee and tea, regular and decaf                             liquids that you cannot  Plain Jell-O in any flavor  (No red)                                           see through such as: Fruit ices (not with fruit pulp)                                     milk, soups, orange juice  Iced Popsicles (No red)                                    All solid food Carbonated beverages, regular and diet                                    Apple juices Sports drinks like Gatorade (No red) Lightly seasoned clear broth or consume(fat free) Sugar, honey syrup  Sample Menu Breakfast                                Lunch                                     Supper Cranberry juice                    Beef broth                            Chicken broth Jell-O  Grape juice                           Apple juice Coffee or tea                        Jell-O                                      Popsicle                                                Coffee or tea                        Coffee or  tea   Complete one Ensure drink the morning of surgery at  6:15AM the day of surgery.   Brush your teeth the morning of surgery.   Do NOT smoke after Midnight   Take these medicines the morning of surgery with A SIP OF WATER: Carvedilol, Pantoprazole   May use eye drops day of surgery                               You may not have any metal on your body including hair pins, jewelry, and body piercings             Do not wear make-up, lotions, powders, perfumes/cologne, or deodorant             Do not wear nail polish.  Do not shave  48 hours prior to surgery.              Do not bring valuables to the hospital. [place].   Contacts, dentures or bridgework may not be worn into surgery.   Bring small overnight bag day of surgery.    Special Instructions: Bring a copy of your healthcare power of attorney and living will documents         the day of surgery if you haven't scanned them in before.              Please read over the following fact sheets you were given:  [facility] - Preparing for Surgery Before surgery, you can play an important role.  Because skin is not sterile, your skin needs to be as free of germs as possible.  You can reduce the number of germs on your skin by washing with CHG (chlorahexidine gluconate) soap before surgery.  CHG is an antiseptic cleaner which kills germs and bonds with the skin to continue killing germs even after washing. Please DO NOT use if you have an allergy to CHG or antibacterial soaps.  If your skin becomes reddened/irritated stop using the CHG and inform your nurse when you arrive at Short Stay. Do not shave (including legs and underarms) for at least 48 hours prior to the first CHG shower.  You may shave your face/neck.  Please follow these instructions carefully:  1.  Shower with CHG Soap the night before surgery and the  morning of surgery.  2.  If you choose to wash your hair, wash your  hair first as usual with your normal  shampoo.  3.  After you shampoo, rinse your hair and body thoroughly to remove the shampoo.                             4.  Use CHG as you would any other liquid soap.  You can apply chg directly to the skin and wash.  Gently with a scrungie or clean washcloth.  5.  Apply the CHG Soap to your body ONLY FROM THE NECK DOWN.   Do   not use on face/ open                           Wound or open sores. Avoid contact with eyes, ears mouth and   genitals (private parts).                       Wash face,  Genitals (private parts) with your normal soap.             6.  Wash thoroughly, paying special attention to the area where your    surgery  will be performed.  7.  Thoroughly rinse your body with warm water from the neck down.  8.  DO NOT shower/wash with your normal soap after using and rinsing off the CHG Soap.                9.  Pat yourself dry with a clean towel.            10.  Wear clean pajamas.            11.  Place clean sheets on your bed the night of your first shower and do not  sleep with pets. Day of Surgery : Do not apply any lotions/deodorants the morning of surgery.  Please wear clean clothes to the hospital/surgery center.  FAILURE TO FOLLOW THESE INSTRUCTIONS MAY RESULT IN THE CANCELLATION OF YOUR SURGERY  PATIENT SIGNATURE_________________________________  NURSE SIGNATURE__________________________________  ________________________________________________________________________   Adam Phenix  An incentive spirometer is a tool that can help keep your lungs clear and active. This tool measures how well you are filling your lungs with each breath. Taking long deep breaths may help reverse or decrease the chance of developing breathing (pulmonary) problems (especially infection) following:  A long period of time when you are unable to move or be active. BEFORE THE PROCEDURE   If the spirometer includes an indicator to show your  best effort, your nurse or respiratory therapist will set it to a desired goal.  If possible, sit up straight or lean slightly forward. Try not to slouch.  Hold the incentive spirometer in an upright position. INSTRUCTIONS FOR USE  1. Sit on the edge of your bed if possible, or sit up as far as you can in bed or on a chair. 2. Hold the incentive spirometer in an upright position. 3. Breathe out normally. 4. Place the mouthpiece in your mouth and seal your lips tightly around it. 5. Breathe in slowly and as deeply as possible, raising the piston or the ball toward the top of the column. 6. Hold your breath for 3-5 seconds or for as long as possible. Allow the piston or ball to fall to the bottom of the column. 7. Remove the mouthpiece from  your mouth and breathe out normally. 8. Rest for a few seconds and repeat Steps 1 through 7 at least 10 times every 1-2 hours when you are awake. Take your time and take a few normal breaths between deep breaths. 9. The spirometer may include an indicator to show your best effort. Use the indicator as a goal to work toward during each repetition. 10. After each set of 10 deep breaths, practice coughing to be sure your lungs are clear. If you have an incision (the cut made at the time of surgery), support your incision when coughing by placing a pillow or rolled up towels firmly against it. Once you are able to get out of bed, walk around indoors and cough well. You may stop using the incentive spirometer when instructed by your caregiver.  RISKS AND COMPLICATIONS  Take your time so you do not get dizzy or light-headed.  If you are in pain, you may need to take or ask for pain medication before doing incentive spirometry. It is harder to take a deep breath if you are having pain. AFTER USE  Rest and breathe slowly and easily.  It can be helpful to keep track of a log of your progress. Your caregiver can provide you with a simple table to help with this. If  you are using the spirometer at home, follow these instructions: [place] IF:   You are having difficultly using the spirometer.  You have trouble using the spirometer as often as instructed.  Your pain medication is not giving enough relief while using the spirometer.  You develop fever of 100.5 F (38.1 C) or higher. SEEK IMMEDIATE MEDICAL CARE IF:   You cough up bloody sputum that had not been present before.  You develop fever of 102 F (38.9 C) or greater.  You develop worsening pain at or near the incision site. MAKE SURE YOU:   Understand these instructions.  Will watch your condition.  Will get help right away if you are not doing well or get worse. Document Released: 03/24/2007 Document Revised: 02/03/2012 Document Reviewed: 05/25/2007 ExitCare Patient Information 2014 ExitCare, [place].   ________________________________________________________________________  WHAT IS A BLOOD TRANSFUSION? Blood Transfusion Information  A transfusion is the replacement of blood or some of its parts. Blood is made up of multiple cells which provide different functions.  Red blood cells carry oxygen and are used for blood loss replacement.  White blood cells fight against infection.  Platelets control bleeding.  Plasma helps clot blood.  Other blood products are available for specialized needs, such as hemophilia or other clotting disorders. BEFORE THE TRANSFUSION  Who gives blood for transfusions?   Healthy volunteers who are fully evaluated to make sure their blood is safe. This is blood bank blood. Transfusion therapy is the safest it has ever been in the practice of medicine. Before blood is taken from a donor, a complete history is taken to make sure that person has no history of diseases nor engages in risky social behavior (examples are intravenous drug use or sexual activity with multiple partners). The donor's travel history is screened to minimize risk of  transmitting infections, such as malaria. The donated blood is tested for signs of infectious diseases, such as HIV and hepatitis. The blood is then tested to be sure it is compatible with you in order to minimize the chance of a transfusion reaction. If you or a relative donates blood, this is often done in anticipation of surgery and is  not appropriate for emergency situations. It takes many days to process the donated blood. RISKS AND COMPLICATIONS Although transfusion therapy is very safe and saves many lives, the main dangers of transfusion include:   Getting an infectious disease.  Developing a transfusion reaction. This is an allergic reaction to something in the blood you were given. Every precaution is taken to prevent this. The decision to have a blood transfusion has been considered carefully by your caregiver before blood is given. Blood is not given unless the benefits outweigh the risks. AFTER THE TRANSFUSION  Right after receiving a blood transfusion, you will usually feel much better and more energetic. This is especially true if your red blood cells have gotten low (anemic). The transfusion raises the level of the red blood cells which carry oxygen, and this usually causes an energy increase.  The nurse administering the transfusion will monitor you carefully for complications. HOME CARE INSTRUCTIONS  No special instructions are needed after a transfusion. You may find your energy is better. Speak with your caregiver about any limitations on activity for underlying diseases you may have. SEEK MEDICAL CARE IF:   Your condition is not improving after your transfusion.  You develop redness or irritation at the intravenous (IV) site. SEEK IMMEDIATE MEDICAL CARE IF:  Any of the following symptoms occur over the next 12 hours:  Shaking chills.  You have a temperature by mouth above 102 F (38.9 C), not controlled by medicine.  Chest, back, or muscle pain.  People around you  feel you are not acting correctly or are confused.  Shortness of breath or difficulty breathing.  Dizziness and fainting.  You get a rash or develop hives.  You have a decrease in urine output.  Your urine turns a dark color or changes to pink, red, or brown. Any of the following symptoms occur over the next 10 days:  You have a temperature by mouth above 102 F (38.9 C), not controlled by medicine.  Shortness of breath.  Weakness after normal activity.  The white part of the eye turns yellow (jaundice).  You have a decrease in the amount of urine or are urinating less often.  Your urine turns a dark color or changes to pink, red, or brown. Document Released: 11/08/2000 Document Revised: 02/03/2012 Document Reviewed: 06/27/2008 [facility] Patient Information 2014 [place], [place].  _______________________________________________________________________

## 2019-07-21 ENCOUNTER — Other Ambulatory Visit: Payer: Self-pay

## 2019-07-21 ENCOUNTER — Encounter (HOSPITAL_COMMUNITY): Payer: Self-pay

## 2019-07-21 ENCOUNTER — Encounter (HOSPITAL_COMMUNITY)
Admission: RE | Admit: 2019-07-21 | Discharge: 2019-07-21 | Disposition: A | Discharge status: Home / Self Care | Payer: Medicare Other | Source: Ambulatory Visit | Attending: Orthopedic Surgery | Admitting: Orthopedic Surgery

## 2019-07-21 DIAGNOSIS — Z20828 Contact with and (suspected) exposure to other viral communicable diseases: Secondary | ICD-10-CM | POA: Diagnosis not present

## 2019-07-21 DIAGNOSIS — Z01812 Encounter for preprocedural laboratory examination: Secondary | ICD-10-CM | POA: Diagnosis not present

## 2019-07-21 HISTORY — DX: Personal history of other medical treatment: Z92.89

## 2019-07-21 HISTORY — DX: Nonrheumatic aortic (valve) stenosis: I35.0

## 2019-07-21 HISTORY — DX: Nausea with vomiting, unspecified: R11.2

## 2019-07-21 HISTORY — DX: Other specified postprocedural states: Z98.890

## 2019-07-21 HISTORY — DX: Other specified postprocedural states: R11.2

## 2019-07-21 HISTORY — DX: Cardiac murmur, unspecified: R01.1

## 2019-07-21 LAB — COMPREHENSIVE METABOLIC PANEL
ALT: 16 U/L (ref 0–44)
AST: 19 U/L (ref 15–41)
Albumin: 4.4 g/dL (ref 3.5–5.0)
Alkaline Phosphatase: 69 U/L (ref 38–126)
Anion gap: 10 (ref 5–15)
BUN: 35 mg/dL — ABNORMAL HIGH (ref 8–23)
CO2: 24 mmol/L (ref 22–32)
Calcium: 9 mg/dL (ref 8.9–10.3)
Chloride: 106 mmol/L (ref 98–111)
Creatinine, Ser: 1.56 mg/dL — ABNORMAL HIGH (ref 0.44–1.00)
GFR calc Af Amer: 35 mL/min — ABNORMAL LOW (ref >60)
GFR calc non Af Amer: 30 mL/min — ABNORMAL LOW (ref >60)
Glucose, Bld: 106 mg/dL — ABNORMAL HIGH (ref 70–99)
Potassium: 4.6 mmol/L (ref 3.5–5.1)
Sodium: 140 mmol/L (ref 135–145)
Total Bilirubin: 3.2 mg/dL — ABNORMAL HIGH (ref 0.3–1.2)
Total Protein: 7 g/dL (ref 6.5–8.1)

## 2019-07-21 LAB — CBC
HCT: 28.1 % — ABNORMAL LOW (ref 36.0–46.0)
Hemoglobin: 9 g/dL — ABNORMAL LOW (ref 12.0–15.0)
MCH: 31.3 pg (ref 26.0–34.0)
MCHC: 32 g/dL (ref 30.0–36.0)
MCV: 97.6 fL (ref 80.0–100.0)
Platelets: 222 10*3/uL (ref 150–400)
RBC: 2.88 MIL/uL — ABNORMAL LOW (ref 3.87–5.11)
RDW: 20 % — ABNORMAL HIGH (ref 11.5–15.5)
WBC: 7.5 10*3/uL (ref 4.0–10.5)
nRBC: 0 % (ref 0.0–0.2)

## 2019-07-21 LAB — SURGICAL PCR SCREEN
MRSA, PCR: NEGATIVE
Staphylococcus aureus: NEGATIVE

## 2019-07-21 LAB — PROTIME-INR
INR: 1.1 (ref 0.8–1.2)
Prothrombin Time: 14.1 seconds (ref 11.4–15.2)

## 2019-07-21 LAB — APTT: aPTT: 33 seconds (ref 24–36)

## 2019-07-21 NOTE — Progress Notes (Addendum)
PCP - Dr. [name] Cardiologist - Dr. Dorris Carnes  Chest x-ray - 06/18/2019 in epic EKG - 06/21/2019 in peic Stress Test -N/A  ECHO - 01/2018 in epic Cardiac Cath - N/A  Sleep Study -  N/A CPAP -  N/A  Fasting Blood Sugar -  N/A Checks Blood Sugar N/A  times a day  Blood Thinner Instructions: Aspirin Instructions: N/A Last Dose: N/A  Anesthesia review: abnormal labs 07/21/2019 BUN 35, Creat 1.56, CBC  Patient denies shortness of breath, fever, cough and chest pain at PAT appointment   Patient verbalized understanding of instructions that were given to them at the PAT appointment. Patient was also instructed that they will need to review over the PAT instructions again at home before surgery.

## 2019-07-21 NOTE — Progress Notes (Signed)
SPOKE W/  Claudia Lara     SCREENING SYMPTOMS OF COVID 19:   COUGH--NO  RUNNY NOSE--- NO  SORE THROAT---NO  NASAL CONGESTION----NO  SNEEZING----NO  SHORTNESS OF BREATH---NO  DIFFICULTY BREATHING---NO  TEMP >100.0 -----NO  UNEXPLAINED BODY ACHES------NO  CHILLS --------NO   HEADACHES ---------NO  LOSS OF SMELL/ TASTE --------NO    HAVE YOU OR ANY FAMILY MEMBER TRAVELLED PAST 14 DAYS OUT OF THE   COUNTY---NO STATE----NO COUNTRY----NO  HAVE YOU OR ANY FAMILY MEMBER BEEN EXPOSED TO ANYONE WITH COVID 19? NO

## 2019-07-22 ENCOUNTER — Other Ambulatory Visit (HOSPITAL_COMMUNITY)
Admission: RE | Admit: 2019-07-22 | Discharge: 2019-07-22 | Disposition: A | Discharge status: Home / Self Care | Payer: Medicare Other | Source: Ambulatory Visit | Attending: Orthopedic Surgery | Admitting: Orthopedic Surgery

## 2019-07-22 DIAGNOSIS — Z01812 Encounter for preprocedural laboratory examination: Secondary | ICD-10-CM | POA: Diagnosis not present

## 2019-07-22 DIAGNOSIS — R911 Solitary pulmonary nodule: Secondary | ICD-10-CM | POA: Diagnosis not present

## 2019-07-22 LAB — SARS CORONAVIRUS 2 (TAT 6-24 HRS): SARS Coronavirus 2: NEGATIVE

## 2019-07-23 NOTE — Progress Notes (Signed)
Anesthesia Chart Review   Case: [id number] Date/Time: 07/26/19 [id number]   Procedure: TOTAL KNEE ARTHROPLASTY (Left ) - 53min   Anesthesia type: Choice   Pre-op diagnosis: left knee osteoarthritis   Location: [facility] ROOM 09 / [facility]   Surgeon: Gaynelle Arabian, MD      DISCUSSION:85 y.o. never smoker with h/o PONV, HTN, carotid artery stenosis, mild-moderate mitral valve regurgitation, GERD, left knee OA scheduled for above procedure 07/26/2019 with Dr. Gaynelle Arabian.   Per cardiologist, Dr. Dorris Carnes, 06/21/2019, "With no angina (I am not convinced chronic dyspnea represents anginal equivalent) I think the patient does not need any further cardiac testing prior to planned surgery   Rel low risk for major cardiac event.   (normal myovue in 2014).  With coronary calcifications I would check place on statin and follow lipids GOal LDL 70"  Anticipate pt can proceed with planned procedure barring acute status change.   VS: BP (!) 142/42   Pulse 82   Temp 36.7 C (Oral)   Resp 16   Ht 5\' 3"  (1.6 m)   Wt 77.2 kg   SpO2 99%   BMI 30.15 kg/m   PROVIDERS: Janith Lima, MD is PCP  Dorris Carnes, MD is Cardiologist   Marshell Garfinkel, MD is Pulmonologist last seen 07/15/2019, stable LABS: Labs reviewed: Acceptable for surgery. (all labs ordered are listed, but only abnormal results are displayed)  Labs Reviewed  CBC - Abnormal; Notable for the following components:      Result Value   RBC 2.88 (*)    Hemoglobin 9.0 (*)    HCT 28.1 (*)    RDW 20.0 (*)    All other components within normal limits  COMPREHENSIVE METABOLIC PANEL - Abnormal; Notable for the following components:   Glucose, Bld 106 (*)    BUN 35 (*)    Creatinine, Ser 1.56 (*)    Total Bilirubin 3.2 (*)    GFR calc non Af Amer 30 (*)    GFR calc Af Amer 35 (*)    All other components within normal limits  SURGICAL PCR SCREEN  APTT  PROTIME-INR  TYPE AND SCREEN     IMAGES: Carotid US 08/29/2017 Final Interpretation: Right  Carotid: There is evidence in the right ICA of a less than 40% stenosis.                Non-hemodynamically significant plaque <50% noted in the CCA. The                extracranial vessels were near-normal with only minimal wall                thickening or plaque.  Left Carotid: There is evidence in the left ICA of a less than 40% stenosis.               Non-hemodynamically significant plaque noted in the CCA.  EKG: 06/21/2019 Rate 78 bpm Normal sinus rhythm   CV: Echo 02/18/18 Study Conclusions  - Left ventricle: The cavity size was normal. Wall thickness was   increased in a pattern of moderate LVH. Systolic function was   vigorous. The estimated ejection fraction was in the range of 65%   to 70%. Wall motion was normal; there were no regional wall   motion abnormalities. Doppler parameters are consistent with   abnormal left ventricular relaxation (grade 1 diastolic   dysfunction). The E/e&' ratio is >15, suggesting elevated LV   filling pressure. - Aortic valve:  Trileaflet. Sclerosis without stenosis.   Transvalvular velocity was minimally increased. Mean gradient   (S): 12 mm Hg. - Mitral valve: Mildly thickened leaflets . There was mild to   moderate regurgitation. - Left atrium: The atrium was mildly dilated. - Tricuspid valve: There was mild regurgitation. - Pulmonary arteries: PA peak pressure: 45 mm Hg (S). - Inferior vena cava: The vessel was normal in size. The   respirophasic diameter changes were in the normal range (= 50%),   consistent with normal central venous pressure. - Pericardium, extracardiac: Features were not consistent with   tamponade physiology.  Impressions:  - Compared to a prior study in 2017, the LVEF is higher at 65-70%.   The aortic valve gradient is stable - the valve is sclerotic   without obvious stenosis.  Myocardial Perfusion Imaging 07/02/2013 Please report. The stress test was normal. No evidence of ischemia or blockage or  scar. Normal ejection fraction. Past Medical History:  Diagnosis Date  . ANEMIA 08/22/2009  . Aortic stenosis   . AR (aortic regurgitation) 01/2019   Mild to Moderate, Noted on ECHO  . CAROTID ARTERY STENOSIS 01/16/2010  . Complication of anesthesia    very hard to wake up from surgery  . DYSPNEA ON EXERTION 11/16/2007  . Gallstones   . GEN OSTEOARTHROSIS INVOLVING MULTIPLE SITES 11/11/2008  . GERD (gastroesophageal reflux disease)   . Grade I diastolic dysfunction [id number]   Noted on ECHO  . Heart murmur   . HEMORRHOIDS, INTERNAL    surgery was in the 90's (per patient)  . History of blood transfusion    after hip and knee replacements  . NECK PAIN, ACUTE 12/28/2009  . PERIPHERAL EDEMA 10/06/2007  . PONV (postoperative nausea and vomiting)   . Pulmonary nodule 06/18/2019   noted on CT Chest   . PVD 10/06/2007  . Unspecified essential hypertension 10/19/2007    Past Surgical History:  Procedure Laterality Date  . ABDOMINAL HYSTERECTOMY    . BUNIONECTOMY     bilat  . CARPAL TUNNEL RELEASE    . CHOLECYSTECTOMY  01/29/2016   at [facility]  . CHOLECYSTECTOMY    . ENDOSCOPIC RETROGRADE CHOLANGIOPANCREATOGRAPHY (ERCP) WITH PROPOFOL N/A 12/01/2015   Procedure: ENDOSCOPIC RETROGRADE CHOLANGIOPANCREATOGRAPHY (ERCP) WITH PROPOFOL;  Surgeon: [name], MD;  Location: [facility];  Service: Endoscopy;  Laterality: N/A;  . ENDOSCOPIC RETROGRADE CHOLANGIOPANCREATOGRAPHY (ERCP) WITH PROPOFOL N/A 02/01/2016   Procedure: ENDOSCOPIC RETROGRADE CHOLANGIOPANCREATOGRAPHY (ERCP) WITH PROPOFOL;  Surgeon: [name], MD;  Location[facility];  Service: Endoscopy;  Laterality: N/A;  . ENDOSCOPIC RETROGRADE CHOLANGIOPANCREATOGRAPHY (ERCP) WITH PROPOFOL N/A 04/07/2018   Procedure: ENDOSCOPIC RETROGRADE CHOLANGIOPANCREATOGRAPHY (ERCP) WITH PROPOFOL;  Surgeon: [name], MD;  Location[facility];  Service: Endoscopy;  Laterality: N/A;  . HAMMER TOE SURGERY    .  HEMORRHOID SURGERY    . Hyperplastic colon polyps, removed  2007   By Dr. Penelope Coop  . JOINT REPLACEMENT  2011   right knee  . OOPHORECTOMY    . REMOVAL OF STONES  04/07/2018   Procedure: REMOVAL OF STONES;  Surgeon: [name], MD;  Location: [facility];  Service: Endoscopy;;  . ROTATOR CUFF REPAIR  2004   left (Dr. Durward Fortes)  . SPHINCTEROTOMY  04/07/2018   Procedure: SPHINCTEROTOMY;  Surgeon: [name], MD;  Location[facility];  Service: Endoscopy;;  . TOTAL HIP ARTHROPLASTY Right 11/26/2016   Procedure: RIGHT TOTAL HIP ARTHROPLASTY;  Surgeon: Garald Balding, MD;  Location: [place];  Service: Orthopedics;  Laterality: Right;  . TOTAL KNEE ARTHROPLASTY Right     MEDICATIONS: . Artificial Tear Solution (SOOTHE XP OP)  . Calcium Carb-Cholecalciferol (CALCIUM CARBONATE-VITAMIN D3 PO)  . carvedilol (COREG) 12.5 MG tablet  . cholecalciferol (VITAMIN D) 1000 units tablet  . ferrous sulfate 325 (65 FE) MG tablet  . meloxicam (MOBIC) 7.5 MG tablet  . Multiple Vitamin (MULTIVITAMIN WITH MINERALS) TABS tablet  . OVER THE COUNTER MEDICATION  . pantoprazole (PROTONIX) 40 MG tablet  . torsemide (DEMADEX) 20 MG tablet  . vitamin C (ASCORBIC ACID) 500 MG tablet  . zinc gluconate 50 MG tablet   No current facility-administered medications for this encounter.      Maia Plan [facility] Pre-Surgical Testing [phone] 07/23/19  9:22 AM

## 2019-07-23 NOTE — Anesthesia Preprocedure Evaluation (Addendum)
Anesthesia Evaluation  Patient identified by MRN, date of birth, ID band Patient awake    Reviewed: Allergy & Precautions, H&P , NPO status , Patient's Chart, lab work & pertinent test results  History of Anesthesia Complications (+) PONV and history of anesthetic complications  Airway Mallampati: II   Neck ROM: full    Dental   Pulmonary neg pulmonary ROS,    breath sounds clear to auscultation       Cardiovascular hypertension, + Peripheral Vascular Disease  + Valvular Problems/Murmurs MR  Rhythm:regular Rate:Normal  TTE (01/2018): EF 70%, mild-mod MR   Neuro/Psych    GI/Hepatic GERD  ,  Endo/Other    Renal/GU Renal InsufficiencyRenal disease     Musculoskeletal  (+) Arthritis ,   Abdominal   Peds  Hematology  (+) Blood dyscrasia, anemia ,   Anesthesia Other Findings   Reproductive/Obstetrics                            Anesthesia Physical Anesthesia Plan  ASA: III  Anesthesia Plan: Spinal   Post-op Pain Management:  Regional for Post-op pain   Induction: Intravenous  PONV Risk Score and Plan: 3 and Ondansetron, Dexamethasone, Propofol infusion and Treatment may vary due to age or medical condition  Airway Management Planned: Simple Face Mask  Additional Equipment:   Intra-op Plan:   Post-operative Plan:   Informed Consent: I have reviewed the patients History and Physical, chart, labs and discussed the procedure including the risks, benefits and alternatives for the proposed anesthesia with the patient or authorized representative who has indicated his/her understanding and acceptance.       Plan Discussed with: CRNA, Anesthesiologist and Surgeon  Anesthesia Plan Comments: (See PAT note 07/21/2019, Konrad Felix, PA-C)       Anesthesia Quick Evaluation

## 2019-07-25 MED ORDER — BUPIVACAINE LIPOSOME 1.3 % IJ SUSP
20.0000 mL | Freq: Once | INTRAMUSCULAR | Status: DC
  Filled 2019-07-25: qty 20

## 2019-07-26 ENCOUNTER — Inpatient Hospital Stay (HOSPITAL_COMMUNITY)
Admission: AD | Admit: 2019-07-26 | Discharge: 2019-07-28 | DRG: 470 | Disposition: A | Discharge status: Home Health | Payer: Medicare Other | Attending: Orthopedic Surgery | Admitting: Orthopedic Surgery

## 2019-07-26 ENCOUNTER — Encounter (HOSPITAL_COMMUNITY)
Admission: AD | Disposition: A | Discharge status: Home Health | Payer: Self-pay | Source: Home / Self Care | Attending: Orthopedic Surgery

## 2019-07-26 ENCOUNTER — Ambulatory Visit (HOSPITAL_COMMUNITY): Payer: Medicare Other | Admitting: Anesthesiology

## 2019-07-26 ENCOUNTER — Other Ambulatory Visit: Payer: Self-pay

## 2019-07-26 ENCOUNTER — Encounter (HOSPITAL_COMMUNITY): Payer: Self-pay | Admitting: Anesthesiology

## 2019-07-26 ENCOUNTER — Ambulatory Visit (HOSPITAL_COMMUNITY): Payer: Medicare Other | Admitting: Physician Assistant

## 2019-07-26 DIAGNOSIS — E785 Hyperlipidemia, unspecified: Secondary | ICD-10-CM | POA: Diagnosis present

## 2019-07-26 DIAGNOSIS — K219 Gastro-esophageal reflux disease without esophagitis: Secondary | ICD-10-CM | POA: Diagnosis present

## 2019-07-26 DIAGNOSIS — M1712 Unilateral primary osteoarthritis, left knee: Secondary | ICD-10-CM | POA: Diagnosis not present

## 2019-07-26 DIAGNOSIS — E1151 Type 2 diabetes mellitus with diabetic peripheral angiopathy without gangrene: Secondary | ICD-10-CM | POA: Diagnosis present

## 2019-07-26 DIAGNOSIS — Z96651 Presence of right artificial knee joint: Secondary | ICD-10-CM | POA: Diagnosis not present

## 2019-07-26 DIAGNOSIS — G8918 Other acute postprocedural pain: Secondary | ICD-10-CM | POA: Diagnosis not present

## 2019-07-26 DIAGNOSIS — M179 Osteoarthritis of knee, unspecified: Secondary | ICD-10-CM | POA: Diagnosis present

## 2019-07-26 DIAGNOSIS — I2510 Atherosclerotic heart disease of native coronary artery without angina pectoris: Secondary | ICD-10-CM | POA: Diagnosis not present

## 2019-07-26 DIAGNOSIS — M1710 Unilateral primary osteoarthritis, unspecified knee: Secondary | ICD-10-CM

## 2019-07-26 DIAGNOSIS — I10 Essential (primary) hypertension: Secondary | ICD-10-CM | POA: Diagnosis present

## 2019-07-26 DIAGNOSIS — Z90710 Acquired absence of both cervix and uterus: Secondary | ICD-10-CM

## 2019-07-26 DIAGNOSIS — I129 Hypertensive chronic kidney disease with stage 1 through stage 4 chronic kidney disease, or unspecified chronic kidney disease: Secondary | ICD-10-CM | POA: Diagnosis not present

## 2019-07-26 DIAGNOSIS — N183 Chronic kidney disease, stage 3 (moderate): Secondary | ICD-10-CM | POA: Diagnosis not present

## 2019-07-26 HISTORY — PX: TOTAL KNEE ARTHROPLASTY: SHX125

## 2019-07-26 LAB — TYPE AND SCREEN
ABO/RH(D): A POS
Antibody Screen: NEGATIVE

## 2019-07-26 SURGERY — ARTHROPLASTY, KNEE, TOTAL
Anesthesia: Spinal | Site: Knee | Laterality: Left

## 2019-07-26 MED ORDER — BUPIVACAINE LIPOSOME 1.3 % IJ SUSP
INTRAMUSCULAR | Status: DC | PRN
  Administered 2019-07-26: 20 mL

## 2019-07-26 MED ORDER — PANTOPRAZOLE SODIUM 40 MG PO TBEC
40.0000 mg | DELAYED_RELEASE_TABLET | Freq: Every day | ORAL | Status: DC
  Administered 2019-07-27 – 2019-07-28 (×2): 40 mg via ORAL
  Filled 2019-07-26 (×2): qty 1

## 2019-07-26 MED ORDER — PHENYLEPHRINE HCL (PRESSORS) 10 MG/ML IV SOLN
INTRAVENOUS | Status: AC
  Filled 2019-07-26: qty 1

## 2019-07-26 MED ORDER — POVIDONE-IODINE 10 % EX SWAB
2.0000 "application " | Freq: Once | CUTANEOUS | Status: AC
  Administered 2019-07-26: 2 via TOPICAL

## 2019-07-26 MED ORDER — TRANEXAMIC ACID-NACL 1000-0.7 MG/100ML-% IV SOLN
1000.0000 mg | INTRAVENOUS | Status: AC
  Administered 2019-07-26: 1000 mg via INTRAVENOUS
  Filled 2019-07-26: qty 100

## 2019-07-26 MED ORDER — CARVEDILOL 12.5 MG PO TABS
12.5000 mg | ORAL_TABLET | Freq: Two times a day (BID) | ORAL | Status: DC
  Administered 2019-07-27 – 2019-07-28 (×3): 12.5 mg via ORAL
  Filled 2019-07-26 (×3): qty 1

## 2019-07-26 MED ORDER — FLEET ENEMA 7-19 GM/118ML RE ENEM: 1.0000 | ENEMA | Freq: Once | RECTAL | Status: DC | PRN

## 2019-07-26 MED ORDER — METHOCARBAMOL 500 MG IVPB - SIMPLE MED
500.0000 mg | Freq: Four times a day (QID) | INTRAVENOUS | Status: DC | PRN
  Administered 2019-07-26: 500 mg via INTRAVENOUS
  Filled 2019-07-26: qty 50
  Filled 2019-07-26: qty 500

## 2019-07-26 MED ORDER — ACETAMINOPHEN 10 MG/ML IV SOLN
1000.0000 mg | Freq: Four times a day (QID) | INTRAVENOUS | Status: DC
  Administered 2019-07-26: 1000 mg via INTRAVENOUS
  Filled 2019-07-26: qty 100

## 2019-07-26 MED ORDER — ASPIRIN EC 325 MG PO TBEC
325.0000 mg | DELAYED_RELEASE_TABLET | Freq: Two times a day (BID) | ORAL | Status: DC
  Administered 2019-07-27 – 2019-07-28 (×3): 325 mg via ORAL
  Filled 2019-07-26 (×3): qty 1

## 2019-07-26 MED ORDER — PHENOL 1.4 % MT LIQD
1.0000 | OROMUCOSAL | Status: DC | PRN
  Filled 2019-07-26: qty 177

## 2019-07-26 MED ORDER — OXYCODONE HCL 5 MG PO TABS
5.0000 mg | ORAL_TABLET | ORAL | Status: DC | PRN
  Administered 2019-07-26: 10 mg via ORAL
  Administered 2019-07-26 (×2): 5 mg via ORAL
  Filled 2019-07-26: qty 2
  Filled 2019-07-26 (×2): qty 1

## 2019-07-26 MED ORDER — SODIUM CHLORIDE (PF) 0.9 % IJ SOLN
INTRAMUSCULAR | Status: DC | PRN
  Administered 2019-07-26: 60 mL

## 2019-07-26 MED ORDER — PROPOFOL 10 MG/ML IV BOLUS
INTRAVENOUS | Status: AC
  Filled 2019-07-26: qty 40

## 2019-07-26 MED ORDER — POLYETHYLENE GLYCOL 3350 17 G PO PACK
17.0000 g | PACK | Freq: Every day | ORAL | Status: DC | PRN
  Administered 2019-07-28: 11:00:00 17 g via ORAL
  Filled 2019-07-26: qty 1

## 2019-07-26 MED ORDER — OXYCODONE HCL 5 MG/5ML PO SOLN: 5.0000 mg | Freq: Once | ORAL | Status: DC | PRN

## 2019-07-26 MED ORDER — METOCLOPRAMIDE HCL 5 MG PO TABS: 5.0000 mg | ORAL_TABLET | Freq: Three times a day (TID) | ORAL | Status: DC | PRN

## 2019-07-26 MED ORDER — DOCUSATE SODIUM 100 MG PO CAPS
100.0000 mg | ORAL_CAPSULE | Freq: Two times a day (BID) | ORAL | Status: DC
  Administered 2019-07-26 – 2019-07-28 (×4): 100 mg via ORAL
  Filled 2019-07-26 (×4): qty 1

## 2019-07-26 MED ORDER — PROPOFOL 500 MG/50ML IV EMUL
INTRAVENOUS | Status: DC | PRN
  Administered 2019-07-26: 65 ug/kg/min via INTRAVENOUS

## 2019-07-26 MED ORDER — PROPOFOL 10 MG/ML IV BOLUS
INTRAVENOUS | Status: AC
  Filled 2019-07-26: qty 20

## 2019-07-26 MED ORDER — OXYCODONE HCL 5 MG PO TABS: 5.0000 mg | ORAL_TABLET | Freq: Once | ORAL | Status: DC | PRN

## 2019-07-26 MED ORDER — FENTANYL CITRATE (PF) 100 MCG/2ML IJ SOLN
50.0000 ug | INTRAMUSCULAR | Status: DC
  Administered 2019-07-26: 50 ug via INTRAVENOUS
  Filled 2019-07-26: qty 2

## 2019-07-26 MED ORDER — ACETAMINOPHEN 500 MG PO TABS
1000.0000 mg | ORAL_TABLET | Freq: Four times a day (QID) | ORAL | Status: AC
  Administered 2019-07-26 – 2019-07-27 (×4): 1000 mg via ORAL
  Filled 2019-07-26 (×4): qty 2

## 2019-07-26 MED ORDER — BUPIVACAINE IN DEXTROSE 0.75-8.25 % IT SOLN
INTRATHECAL | Status: DC | PRN
  Administered 2019-07-26: 1.6 mL via INTRATHECAL

## 2019-07-26 MED ORDER — SODIUM CHLORIDE (PF) 0.9 % IJ SOLN
INTRAMUSCULAR | Status: AC
  Filled 2019-07-26: qty 50

## 2019-07-26 MED ORDER — SODIUM CHLORIDE 0.9 % IR SOLN
Status: DC | PRN
  Administered 2019-07-26: 1000 mL

## 2019-07-26 MED ORDER — MORPHINE SULFATE (PF) 2 MG/ML IV SOLN: 1.0000 mg | INTRAVENOUS | Status: DC | PRN

## 2019-07-26 MED ORDER — SODIUM CHLORIDE (PF) 0.9 % IJ SOLN
INTRAMUSCULAR | Status: AC
  Filled 2019-07-26: qty 10

## 2019-07-26 MED ORDER — GABAPENTIN 100 MG PO CAPS
100.0000 mg | ORAL_CAPSULE | Freq: Three times a day (TID) | ORAL | Status: DC
  Administered 2019-07-26 – 2019-07-28 (×6): 100 mg via ORAL
  Filled 2019-07-26 (×6): qty 1

## 2019-07-26 MED ORDER — EPHEDRINE SULFATE-NACL 50-0.9 MG/10ML-% IV SOSY
PREFILLED_SYRINGE | INTRAVENOUS | Status: DC | PRN
  Administered 2019-07-26: 10 mg via INTRAVENOUS

## 2019-07-26 MED ORDER — FENTANYL CITRATE (PF) 100 MCG/2ML IJ SOLN: 25.0000 ug | INTRAMUSCULAR | Status: DC | PRN

## 2019-07-26 MED ORDER — DIPHENHYDRAMINE HCL 12.5 MG/5ML PO ELIX: 12.5000 mg | ORAL_SOLUTION | ORAL | Status: DC | PRN

## 2019-07-26 MED ORDER — ONDANSETRON HCL 4 MG PO TABS
4.0000 mg | ORAL_TABLET | Freq: Four times a day (QID) | ORAL | Status: DC | PRN
  Administered 2019-07-26: 4 mg via ORAL
  Filled 2019-07-26: qty 1

## 2019-07-26 MED ORDER — ONDANSETRON HCL 4 MG/2ML IJ SOLN: 4.0000 mg | Freq: Four times a day (QID) | INTRAMUSCULAR | Status: DC | PRN

## 2019-07-26 MED ORDER — FERROUS SULFATE 325 (65 FE) MG PO TABS
325.0000 mg | ORAL_TABLET | Freq: Every day | ORAL | Status: DC
  Administered 2019-07-27 – 2019-07-28 (×2): 325 mg via ORAL
  Filled 2019-07-26 (×2): qty 1

## 2019-07-26 MED ORDER — BISACODYL 10 MG RE SUPP
10.0000 mg | Freq: Every day | RECTAL | Status: DC | PRN
  Administered 2019-07-28: 10 mg via RECTAL
  Filled 2019-07-26: qty 1

## 2019-07-26 MED ORDER — SODIUM CHLORIDE 0.9 % IV SOLN
INTRAVENOUS | Status: DC | PRN
  Administered 2019-07-26: 10:00:00 25 ug/min via INTRAVENOUS

## 2019-07-26 MED ORDER — DEXAMETHASONE SODIUM PHOSPHATE 10 MG/ML IJ SOLN
10.0000 mg | Freq: Once | INTRAMUSCULAR | Status: AC
  Administered 2019-07-27: 10 mg via INTRAVENOUS
  Filled 2019-07-26: qty 1

## 2019-07-26 MED ORDER — ROPIVACAINE HCL 5 MG/ML IJ SOLN
INTRAMUSCULAR | Status: DC | PRN
  Administered 2019-07-26: 20 mL via PERINEURAL

## 2019-07-26 MED ORDER — LACTATED RINGERS IV SOLN
INTRAVENOUS | Status: DC
  Administered 2019-07-26 (×2): via INTRAVENOUS

## 2019-07-26 MED ORDER — ONDANSETRON HCL 4 MG/2ML IJ SOLN
INTRAMUSCULAR | Status: DC | PRN
  Administered 2019-07-26: 4 mg via INTRAVENOUS

## 2019-07-26 MED ORDER — METHOCARBAMOL 500 MG PO TABS: 500.0000 mg | ORAL_TABLET | Freq: Four times a day (QID) | ORAL | Status: DC | PRN

## 2019-07-26 MED ORDER — CHLORHEXIDINE GLUCONATE 4 % EX LIQD: 60.0000 mL | Freq: Once | CUTANEOUS | Status: DC

## 2019-07-26 MED ORDER — SODIUM CHLORIDE 0.9 % IV SOLN
INTRAVENOUS | Status: DC
  Administered 2019-07-26 – 2019-07-27 (×2): via INTRAVENOUS

## 2019-07-26 MED ORDER — LIDOCAINE 2% (20 MG/ML) 5 ML SYRINGE
INTRAMUSCULAR | Status: DC | PRN
  Administered 2019-07-26: 60 mg via INTRAVENOUS

## 2019-07-26 MED ORDER — CEFAZOLIN SODIUM-DEXTROSE 1-4 GM/50ML-% IV SOLN
1.0000 g | Freq: Four times a day (QID) | INTRAVENOUS | Status: AC
  Administered 2019-07-26 (×2): 1 g via INTRAVENOUS
  Filled 2019-07-26 (×3): qty 50

## 2019-07-26 MED ORDER — DEXAMETHASONE SODIUM PHOSPHATE 10 MG/ML IJ SOLN
8.0000 mg | Freq: Once | INTRAMUSCULAR | Status: AC
  Administered 2019-07-26: 8 mg via INTRAVENOUS

## 2019-07-26 MED ORDER — CEFAZOLIN SODIUM-DEXTROSE 2-4 GM/100ML-% IV SOLN
2.0000 g | INTRAVENOUS | Status: AC
  Administered 2019-07-26: 2 g via INTRAVENOUS
  Filled 2019-07-26: qty 100

## 2019-07-26 MED ORDER — PROPOFOL 10 MG/ML IV BOLUS
INTRAVENOUS | Status: DC | PRN
  Administered 2019-07-26 (×6): 10 mg via INTRAVENOUS

## 2019-07-26 MED ORDER — METOCLOPRAMIDE HCL 5 MG/ML IJ SOLN
5.0000 mg | Freq: Three times a day (TID) | INTRAMUSCULAR | Status: DC | PRN
  Administered 2019-07-26: 10 mg via INTRAVENOUS
  Filled 2019-07-26: qty 2

## 2019-07-26 MED ORDER — TORSEMIDE 20 MG PO TABS
20.0000 mg | ORAL_TABLET | Freq: Every day | ORAL | Status: DC
  Administered 2019-07-27: 20 mg via ORAL
  Filled 2019-07-26 (×2): qty 1

## 2019-07-26 MED ORDER — MENTHOL 3 MG MT LOZG: 1.0000 | LOZENGE | OROMUCOSAL | Status: DC | PRN

## 2019-07-26 MED ORDER — TRAMADOL HCL 50 MG PO TABS: 50.0000 mg | ORAL_TABLET | Freq: Two times a day (BID) | ORAL | Status: DC | PRN

## 2019-07-26 SURGICAL SUPPLY — 53 items
BAG ZIPLOCK 12X15 (MISCELLANEOUS) ×2 IMPLANT
BLADE SAG 18X100X1.27 (BLADE) ×1 IMPLANT
BLADE SAW SGTL 11.0X1.19X90.0M (BLADE) ×2 IMPLANT
BNDG ELASTIC 6X5.8 VLCR STR LF (GAUZE/BANDAGES/DRESSINGS) ×2 IMPLANT
BOWL SMART MIX CTS (DISPOSABLE) ×2 IMPLANT
CEMENT HV SMART SET (Cement) ×4 IMPLANT
CEMENT TIBIA MBT SIZE 2.5 (Knees) IMPLANT
COVER SURGICAL LIGHT HANDLE (MISCELLANEOUS) ×2 IMPLANT
COVER WAND RF STERILE (DRAPES) IMPLANT
CUFF TOURN SGL QUICK 34 (TOURNIQUET CUFF) ×1
CUFF TRNQT CYL 34X4.125X (TOURNIQUET CUFF) ×1 IMPLANT
DECANTER SPIKE VIAL GLASS SM (MISCELLANEOUS) ×3 IMPLANT
DRAPE U-SHAPE 47X51 STRL (DRAPES) ×2 IMPLANT
DRSG ADAPTIC 3X8 NADH LF (GAUZE/BANDAGES/DRESSINGS) ×2 IMPLANT
DRSG PAD ABDOMINAL 8X10 ST (GAUZE/BANDAGES/DRESSINGS) ×2 IMPLANT
DURAPREP 26ML APPLICATOR (WOUND CARE) ×2 IMPLANT
ELECT REM PT RETURN 15FT ADLT (MISCELLANEOUS) ×2 IMPLANT
EVACUATOR 1/8 PVC DRAIN (DRAIN) ×2 IMPLANT
GAUZE SPONGE 4X4 12PLY STRL (GAUZE/BANDAGES/DRESSINGS) ×2 IMPLANT
GLOVE BIO SURGEON STRL SZ8 (GLOVE) ×2 IMPLANT
GLOVE BIOGEL M STRL SZ7.5 (GLOVE) ×1 IMPLANT
GLOVE BIOGEL PI IND STRL 7.5 (GLOVE) IMPLANT
GLOVE BIOGEL PI IND STRL 8 (GLOVE) ×1 IMPLANT
GLOVE BIOGEL PI INDICATOR 7.5 (GLOVE) ×1
GLOVE BIOGEL PI INDICATOR 8 (GLOVE) ×1
GOWN STRL REUS W/TWL LRG LVL3 (GOWN DISPOSABLE) ×4 IMPLANT
GOWN STRL REUS W/TWL XL LVL3 (GOWN DISPOSABLE) ×1 IMPLANT
HANDPIECE INTERPULSE COAX TIP (DISPOSABLE) ×1
HOLDER FOLEY CATH W/STRAP (MISCELLANEOUS) ×1 IMPLANT
IMMOBILIZER KNEE 20 (SOFTGOODS) ×2
IMMOBILIZER KNEE 20 THIGH 36 (SOFTGOODS) ×1 IMPLANT
IMPL FEMUR SIGMA LT PS SZ 3 (Knees) IMPLANT
IMPLANT FEMUR SIGMA LT PS SZ 3 (Knees) ×2 IMPLANT
KIT TURNOVER KIT A (KITS) IMPLANT
MANIFOLD NEPTUNE II (INSTRUMENTS) ×2 IMPLANT
PACK TOTAL KNEE CUSTOM (KITS) ×2 IMPLANT
PADDING CAST COTTON 6X4 STRL (CAST SUPPLIES) ×6 IMPLANT
PATELLA DOME PFC 35MM (Knees) ×1 IMPLANT
PIN STEINMAN FIXATION KNEE (PIN) ×1 IMPLANT
PLATE ROT INSERT 12.5MM SIZE 3 (Plate) ×1 IMPLANT
PROTECTOR NERVE ULNAR (MISCELLANEOUS) ×2 IMPLANT
SET HNDPC FAN SPRY TIP SCT (DISPOSABLE) ×1 IMPLANT
STRIP CLOSURE SKIN 1/2X4 (GAUZE/BANDAGES/DRESSINGS) ×4 IMPLANT
SUT MNCRL AB 4-0 PS2 18 (SUTURE) ×2 IMPLANT
SUT STRATAFIX 0 PDS 27 VIOLET (SUTURE) ×2
SUT VIC AB 2-0 CT1 27 (SUTURE) ×3
SUT VIC AB 2-0 CT1 TAPERPNT 27 (SUTURE) ×3 IMPLANT
SUTURE STRATFX 0 PDS 27 VIOLET (SUTURE) ×1 IMPLANT
TIBIA MBT CEMENT SIZE 2.5 (Knees) ×2 IMPLANT
TRAY FOLEY MTR SLVR 14FR STAT (SET/KITS/TRAYS/PACK) ×1 IMPLANT
WATER STERILE IRR 1000ML POUR (IV SOLUTION) ×4 IMPLANT
WRAP KNEE MAXI GEL POST OP (GAUZE/BANDAGES/DRESSINGS) ×2 IMPLANT
YANKAUER SUCT BULB TIP 10FT TU (MISCELLANEOUS) ×2 IMPLANT

## 2019-07-26 NOTE — Plan of Care (Signed)
  Problem: Education: Goal: Knowledge of General Education information will improve Description: Including pain rating scale, medication(s)/side effects and non-pharmacologic comfort measures 07/26/2019 1403 by Hubert Azure, RN Outcome: Progressing 07/26/2019 1136 by Hubert Azure, RN Outcome: Progressing   Problem: Health Behavior/Discharge Planning: Goal: Ability to manage health-related needs will improve 07/26/2019 1403 by Hubert Azure, RN Outcome: Progressing 07/26/2019 1136 by Hubert Azure, RN Outcome: Progressing   Problem: Clinical Measurements: Goal: Ability to maintain clinical measurements within normal limits will improve 07/26/2019 1403 by Hubert Azure, RN Outcome: Progressing 07/26/2019 1136 by Hubert Azure, RN Outcome: Progressing Goal: Will remain free from infection Outcome: Progressing Goal: Diagnostic test results will improve Outcome: Progressing   Problem: Activity: Goal: Risk for activity intolerance will decrease Outcome: Progressing   Problem: Nutrition: Goal: Adequate nutrition will be maintained Outcome: Progressing

## 2019-07-26 NOTE — Op Note (Signed)
OPERATIVE REPORT-TOTAL KNEE ARTHROPLASTY   Pre-operative diagnosis- Osteoarthritis  Left knee(s)  Post-operative diagnosis- Osteoarthritis Left knee(s)  Procedure-  Left  Total Knee Arthroplasty  Surgeon- Dione Plover. Achilles Neville, MD  Assistant- Molli Barrows, PA-C   Anesthesia-  Adductor canal block and spinal  EBL-50 mL   Drains Hemovac  Tourniquet time-  Total Tourniquet Time Documented: Thigh (Left) - 36 minutes Total: Thigh (Left) - 36 minutes     Complications- None  Condition-PACU - hemodynamically stable.   Brief Clinical Note  Claudia Lara is a 85 y.o. year old female with end stage OA of her left knee with progressively worsening pain and dysfunction. She has constant pain, with activity and at rest and significant functional deficits with difficulties even with ADLs. She has had extensive non-op management including analgesics, injections of cortisone and viscosupplements, and home exercise program, but remains in significant pain with significant dysfunction. Radiographs show bone on bone arthritis medial and patellofemoral. She presents now for left Total Knee Arthroplasty.    Procedure in detail---   The patient is brought into the operating room and positioned supine on the operating table. After successful administration of  Adductor canal block and spinal,   a tourniquet is placed high on the  Left thigh(s) and the lower extremity is prepped and draped in the usual sterile fashion. Time out is performed by the operating team and then the  Left lower extremity is wrapped in Esmarch, knee flexed and the tourniquet inflated to 300 mmHg.       A midline incision is made with a ten blade through the subcutaneous tissue to the level of the extensor mechanism. A fresh blade is used to make a medial parapatellar arthrotomy. Soft tissue over the proximal medial tibia is subperiosteally elevated to the joint line with a knife and into the semimembranosus bursa with a Cobb  elevator. Soft tissue over the proximal lateral tibia is elevated with attention being paid to avoiding the patellar tendon on the tibial tubercle. The patella is everted, knee flexed 90 degrees and the ACL and PCL are removed. Findings are bone on bone medial and patellofemoral with large global osteophytes        The drill is used to create a starting hole in the distal femur and the canal is thoroughly irrigated with sterile saline to remove the fatty contents. The 5 degree Left  valgus alignment guide is placed into the femoral canal and the distal femoral cutting block is pinned to remove 10 mm off the distal femur. Resection is made with an oscillating saw.      The tibia is subluxed forward and the menisci are removed. The extramedullary alignment guide is placed referencing proximally at the medial aspect of the tibial tubercle and distally along the second metatarsal axis and tibial crest. The block is pinned to remove 62mm off the more deficient medial  side. Resection is made with an oscillating saw. Size 2.5is the most appropriate size for the tibia and the proximal tibia is prepared with the modular drill and keel punch for that size.      The femoral sizing guide is placed and size 3 is most appropriate. Rotation is marked off the epicondylar axis and confirmed by creating a rectangular flexion gap at 90 degrees. The size 3 cutting block is pinned in this rotation and the anterior, posterior and chamfer cuts are made with the oscillating saw. The intercondylar block is then placed and that cut is made.  Trial size 2.5 tibial component, trial size 3 posterior stabilized femur and a 12.5  mm posterior stabilized rotating platform insert trial is placed. Full extension is achieved with excellent varus/valgus and anterior/posterior balance throughout full range of motion. The patella is everted and thickness measured to be 22  mm. Free hand resection is taken to 12 mm, a 35 template is placed, lug  holes are drilled, trial patella is placed, and it tracks normally. Osteophytes are removed off the posterior femur with the trial in place. All trials are removed and the cut bone surfaces prepared with pulsatile lavage. Cement is mixed and once ready for implantation, the size 2.5 tibial implant, size  3 posterior stabilized femoral component, and the size 35 patella are cemented in place and the patella is held with the clamp. The trial insert is placed and the knee held in full extension. The Exparel (20 ml mixed with 60 ml saline) is injected into the extensor mechanism, posterior capsule, medial and lateral gutters and subcutaneous tissues.  All extruded cement is removed and once the cement is hard the permanent 12.5 mm posterior stabilized rotating platform insert is placed into the tibial tray.      The wound is copiously irrigated with saline solution and the extensor mechanism closed over a hemovac drain with #1 V-loc suture. The tourniquet is released for a total tourniquet time of 36  minutes. Flexion against gravity is 140 degrees and the patella tracks normally. Subcutaneous tissue is closed with 2.0 vicryl and subcuticular with running 4.0 Monocryl. The incision is cleaned and dried and steri-strips and a bulky sterile dressing are applied. The limb is placed into a knee immobilizer and the patient is awakened and transported to recovery in stable condition.      Please note that a surgical assistant was a medical necessity for this procedure in order to perform it in a safe and expeditious manner. Surgical assistant was necessary to retract the ligaments and vital neurovascular structures to prevent injury to them and also necessary for proper positioning of the limb to allow for anatomic placement of the prosthesis.   Dione Plover Alondra Sahni, MD    07/26/2019, 10:36 AM

## 2019-07-26 NOTE — Evaluation (Signed)
Physical Therapy Evaluation Patient Details Name: Claudia Lara MRN: [id number] DOB: August 26, 1932 Today's Date: 07/26/2019   History of Present Illness  85 yo female s/p L TKA; PMH: R TKA, HTN, PVD, R posterior THA  Clinical Impression  Pt is s/p TKA resulting in the deficits listed below (see PT Problem List).  Pt amb 40' with RW and min assist. Anticipate steady progress in acute setting. Pt lives alone and concerned about d/c tomorrow Pt will benefit from skilled PT to increase their independence and safety with mobility to allow discharge to the venue listed below.      Follow Up Recommendations Follow surgeon's recommendation for DC plan and follow-up therapies    Equipment Recommendations  None recommended by PT    Recommendations for Other Services       Precautions / Restrictions Precautions Precautions: Fall;Knee Required Braces or Orthoses: Knee Immobilizer - Left Knee Immobilizer - Left: Discontinue once straight leg raise with < 10 degree lag Restrictions Weight Bearing Restrictions: No Other Position/Activity Restrictions: WBAT      Mobility  Bed Mobility Overal bed mobility: Needs Assistance Bed Mobility: Supine to Sit     Supine to sit: Min guard     General bed mobility comments: for safety  Transfers Overall transfer level: Needs assistance Equipment used: Rolling walker (2 wheeled) Transfers: Sit to/from Stand Sit to Stand: Min guard         General transfer comment: cues for hand placement and LLE positioning  Ambulation/Gait Ambulation/Gait assistance: Min guard;Min assist Gait Distance (Feet): 40 Feet   Gait Pattern/deviations: Step-to pattern;Decreased weight shift to left     General Gait Details: cues for sequence and RW position  Stairs            Wheelchair Mobility    Modified Rankin (Stroke Patients Only)       Balance                                             Pertinent Vitals/Pain Pain  Assessment: 0-10 Pain Score: 3  Pain Location: left knee Pain Descriptors / Indicators: Grimacing;Sore Pain Intervention(s): Monitored during session;Limited activity within patient's tolerance;Premedicated before session;Repositioned    Home Living Family/patient expects to be discharged to:: Private residence Living Arrangements: Alone   Type of Home: House Home Access: Stairs to enter Entrance Stairs-Rails: Can reach both Entrance Stairs-Number of Steps: 2 Home Layout: One level Home Equipment: Walker - 2 wheels;Bedside commode;Shower seat      Prior Function Level of Independence: Independent               Hand Dominance        Extremity/Trunk Assessment   Upper Extremity Assessment Upper Extremity Assessment: Overall WFL for tasks assessed    Lower Extremity Assessment Lower Extremity Assessment: LLE deficits/detail LLE Deficits / Details: ankle WFL; knee extension and hip flexion 3/5; knee flexion grossly 6 to 65 degrees flexion       Communication   Communication: No difficulties  Cognition Arousal/Alertness: Awake/alert Behavior During Therapy: WFL for tasks assessed/performed Overall Cognitive Status: Within Functional Limits for tasks assessed                                        General Comments  Exercises Total Joint Exercises Ankle Circles/Pumps: AROM;10 reps;Both Quad Sets: Both;5 reps;AROM   Assessment/Plan    PT Assessment Patient needs continued PT services  PT Problem List Decreased strength;Decreased range of motion;Decreased activity tolerance;Decreased mobility;Pain;Decreased knowledge of use of DME;Decreased knowledge of precautions       PT Treatment Interventions DME instruction;Gait training;Functional mobility training;Therapeutic activities;Patient/family education;Therapeutic exercise;Stair training    PT Goals (Current goals can be found in the Care Plan section)  Acute Rehab PT Goals PT Goal  Formulation: With patient Time For Goal Achievement: 08/02/19 Potential to Achieve Goals: Good    Frequency 7X/week   Barriers to discharge        Co-evaluation               AM-PAC PT "6 Clicks" Mobility  Outcome Measure Help needed turning from your back to your side while in a flat bed without using bedrails?: A Little Help needed moving from lying on your back to sitting on the side of a flat bed without using bedrails?: A Little Help needed moving to and from a bed to a chair (including a wheelchair)?: A Little Help needed standing up from a chair using your arms (e.g., wheelchair or bedside chair)?: A Little Help needed to walk in hospital room?: A Little Help needed climbing 3-5 steps with a railing? : A Little 6 Click Score: 18    End of Session Equipment Utilized During Treatment: Gait belt Activity Tolerance: Patient tolerated treatment well Patient left: in chair;with call bell/phone within reach;with chair alarm set   PT Visit Diagnosis: Difficulty in walking, not elsewhere classified (R26.2)    Time: [id number] PT Time Calculation (min) (ACUTE ONLY): 26 min   Charges:   PT Evaluation $PT Eval Low Complexity: 1 Low PT Treatments $Gait Training: 8-22 mins        Kenyon Ana, PT  Pager: [phone] Acute Rehab Dept [facility]): [phone]   07/26/2019   [facility] 07/26/2019, 4:03 PM

## 2019-07-26 NOTE — Progress Notes (Signed)
Assisted Dr. [name] with left, ultrasound guided, adductor canal block. Side rails up, monitors on throughout procedure. See vital signs in flow sheet. Tolerated Procedure well.

## 2019-07-26 NOTE — Transfer of Care (Signed)
Immediate Anesthesia Transfer of Care Note  Patient: Claudia Lara  Procedure(s) Performed: Procedure(s) with comments: TOTAL KNEE ARTHROPLASTY (Left) - 41min  Patient Location: PACU  Anesthesia Type:Spinal  Level of Consciousness:  sedated, patient cooperative and responds to stimulation  Airway & Oxygen Therapy:Patient Spontanous Breathing and Patient connected to face mask oxgen  Post-op Assessment:  Report given to PACU RN and Post -op Vital signs reviewed and stable  Post vital signs:  Reviewed and stable  Last Vitals:  Vitals:   07/26/19 0858 07/26/19 0903  BP: (!) 146/75 (!) 139/108  Pulse: 74 79  Resp: 15 16  Temp:    SpO2: [id number]    Complications: No apparent anesthesia complications

## 2019-07-26 NOTE — Discharge Instructions (Signed)
° °Dr. Frank Aluisio °Total Joint Specialist °[facility] °[address] °, [place] [address] °[phone] ° °TOTAL KNEE REPLACEMENT POSTOPERATIVE DIRECTIONS ° °Knee Rehabilitation, Guidelines Following Surgery  °Results after knee surgery are often greatly improved when you follow the exercise, range of motion and muscle strengthening exercises prescribed by your doctor. Safety measures are also important to protect the knee from further injury. Any time any of these exercises cause you to have increased pain or swelling in your knee joint, decrease the amount until you are comfortable again and slowly increase them. If you have problems or questions, call your caregiver or physical therapist for advice.  ° °HOME CARE INSTRUCTIONS  °• Remove items at home which could result in a fall. This includes throw rugs or furniture in walking pathways.  °· ICE to the affected knee every three hours for 30 minutes at a time and then as needed for pain and swelling.  Continue to use ice on the knee for pain and swelling from surgery. You may notice swelling that will progress down to the foot and ankle.  This is normal after surgery.  Elevate the leg when you are not up walking on it.   °· Continue to use the breathing machine which will help keep your temperature down.  It is common for your temperature to cycle up and down following surgery, especially at night when you are not up moving around and exerting yourself.  The breathing machine keeps your lungs expanded and your temperature down. °· Do not place pillow under knee, focus on keeping the knee straight while resting ° °DIET °You may resume your previous home diet once your are discharged from the hospital. ° °DRESSING / WOUND CARE / SHOWERING °You may shower 3 days after surgery, but keep the wounds dry during showering.  You may use an occlusive plastic wrap (Press'n Seal for example), NO SOAKING/SUBMERGING IN THE BATHTUB.  If the bandage  gets wet, change with a clean dry gauze.  If the incision gets wet, pat the wound dry with a clean towel. °You may start showering once you are discharged home but do not submerge the incision under water. Just pat the incision dry and apply a dry gauze dressing on daily. °Change the surgical dressing daily and reapply a dry dressing each time. ° °ACTIVITY °Walk with your walker as instructed. °Use walker as long as suggested by your caregivers. °Avoid periods of inactivity such as sitting longer than an hour when not asleep. This helps prevent blood clots.  °You may resume a sexual relationship in one month or when given the OK by your doctor.  °You may return to work once you are cleared by your doctor.  °Do not drive a car for 6 weeks or until released by you surgeon.  °Do not drive while taking narcotics. ° °WEIGHT BEARING °Weight bearing as tolerated with assist device (walker, cane, etc) as directed, use it as long as suggested by your surgeon or therapist, typically at least 4-6 weeks. ° °POSTOPERATIVE CONSTIPATION PROTOCOL °Constipation - defined medically as fewer than three stools per week and severe constipation as less than one stool per week. ° °One of the most common issues patients have following surgery is constipation.  Even if you have a regular bowel pattern at home, your normal regimen is likely to be disrupted due to multiple reasons following surgery.  Combination of anesthesia, postoperative narcotics, change in appetite and fluid intake all can affect your bowels.    In order to avoid complications following surgery, here are some recommendations in order to help you during your recovery period. ° °Colace (docusate) - Pick up an over-the-counter form of Colace or another stool softener and take twice a day as long as you are requiring postoperative pain medications.  Take with a full glass of water daily.  If you experience loose stools or diarrhea, hold the colace until you stool forms back  up.  If your symptoms do not get better within 1 week or if they get worse, check with your doctor. ° °Dulcolax (bisacodyl) - Pick up over-the-counter and take as directed by the product packaging as needed to assist with the movement of your bowels.  Take with a full glass of water.  Use this product as needed if not relieved by Colace only.  ° °MiraLax (polyethylene glycol) - Pick up over-the-counter to have on hand.  MiraLax is a solution that will increase the amount of water in your bowels to assist with bowel movements.  Take as directed and can mix with a glass of water, juice, soda, coffee, or tea.  Take if you go more than two days without a movement. °Do not use MiraLax more than once per day. Call your doctor if you are still constipated or irregular after using this medication for 7 days in a row. ° °If you continue to have problems with postoperative constipation, please contact the office for further assistance and recommendations.  If you experience "the worst abdominal pain ever" or develop nausea or vomiting, please contact the office immediatly for further recommendations for treatment. ° °ITCHING ° If you experience itching with your medications, try taking only a single pain pill, or even half a pain pill at a time.  You can also use Benadryl over the counter for itching or also to help with sleep.  ° °TED HOSE STOCKINGS °Wear the elastic stockings on both legs for three weeks following surgery during the day but you may remove then at night for sleeping. ° °MEDICATIONS °See your medication summary on the “After Visit Summary” that the nursing staff will review with you prior to discharge.  You may have some home medications which will be placed on hold until you complete the course of blood thinner medication.  It is important for you to complete the blood thinner medication as prescribed by your surgeon.  Continue your approved medications as instructed at time of discharge. ° °PRECAUTIONS °If  you experience chest pain or shortness of breath - call 911 immediately for transfer to the hospital emergency department.  °If you develop a fever greater that 101 F, purulent drainage from wound, increased redness or drainage from wound, foul odor from the wound/dressing, or calf pain - CONTACT YOUR SURGEON.   °                                                °FOLLOW-UP APPOINTMENTS °Make sure you keep all of your appointments after your operation with your surgeon and caregivers. You should call the office at the above phone number and make an appointment for approximately two weeks after the date of your surgery or on the date instructed by your surgeon outlined in the "After Visit Summary". ° ° °RANGE OF MOTION AND STRENGTHENING EXERCISES  °Rehabilitation of the knee is important following a knee injury or   an operation. After just a few days of immobilization, the muscles of the thigh which control the knee become weakened and shrink (atrophy). Knee exercises are designed to build up the tone and strength of the thigh muscles and to improve knee motion. Often times heat used for twenty to thirty minutes before working out will loosen up your tissues and help with improving the range of motion but do not use heat for the first two weeks following surgery. These exercises can be done on a training (exercise) mat, on the floor, on a table or on a bed. Use what ever works the best and is most comfortable for you Knee exercises include:  °• Leg Lifts - While your knee is still immobilized in a splint or cast, you can do straight leg raises. Lift the leg to 60 degrees, hold for 3 sec, and slowly lower the leg. Repeat 10-20 times 2-3 times daily. Perform this exercise against resistance later as your knee gets better.  °• Quad and Hamstring Sets - Tighten up the muscle on the front of the thigh (Quad) and hold for 5-10 sec. Repeat this 10-20 times hourly. Hamstring sets are done by pushing the foot backward against an  object and holding for 5-10 sec. Repeat as with quad sets.  °· Leg Slides: Lying on your back, slowly slide your foot toward your buttocks, bending your knee up off the floor (only go as far as is comfortable). Then slowly slide your foot back down until your leg is flat on the floor again. °· Angel Wings: Lying on your back spread your legs to the side as far apart as you can without causing discomfort.  °A rehabilitation program following serious knee injuries can speed recovery and prevent re-injury in the future due to weakened muscles. Contact your doctor or a physical therapist for more information on knee rehabilitation.  ° °IF YOU ARE TRANSFERRED TO A SKILLED REHAB FACILITY °If the patient is transferred to a skilled rehab facility following release from the hospital, a list of the current medications will be sent to the facility for the patient to continue.  When discharged from the skilled rehab facility, please have the facility set up the patient's Home Health Physical Therapy prior to being released. Also, the skilled facility will be responsible for providing the patient with their medications at time of release from the facility to include their pain medication, the muscle relaxants, and their blood thinner medication. If the patient is still at the rehab facility at time of the two week follow up appointment, the skilled rehab facility will also need to assist the patient in arranging follow up appointment in our office and any transportation needs. ° °MAKE SURE YOU:  °• Understand these instructions.  °• Get help right away if you are not doing well or get worse.  ° ° °Pick up stool softner and laxative for home use following surgery while on pain medications. °Do not submerge incision under water. °Please use good hand washing techniques while changing dressing each day. °May shower starting three days after surgery. °Please use a clean towel to pat the incision dry following showers. °Continue to  use ice for pain and swelling after surgery. °Do not use any lotions or creams on the incision until instructed by your surgeon. ° °

## 2019-07-26 NOTE — Plan of Care (Signed)
?  Problem: Education: ?Goal: Knowledge of General Education information will improve ?Description: Including pain rating scale, medication(s)/side effects and non-pharmacologic comfort measures ?Outcome: Progressing ?  ?Problem: Health Behavior/Discharge Planning: ?Goal: Ability to manage health-related needs will improve ?Outcome: Progressing ?  ?Problem: Clinical Measurements: ?Goal: Ability to maintain clinical measurements within normal limits will improve ?Outcome: Progressing ?Goal: Diagnostic test results will improve ?Outcome: Progressing ?  ?Problem: Activity: ?Goal: Risk for activity intolerance will decrease ?Outcome: Progressing ?  ?Problem: Nutrition: ?Goal: Adequate nutrition will be maintained ?Outcome: Progressing ?  ?

## 2019-07-26 NOTE — Anesthesia Procedure Notes (Signed)
Spinal  Patient location during procedure: OR Start time: 07/26/2019 9:34 AM End time: 07/26/2019 9:38 AM Staffing Anesthesiologist: Albertha Ghee, MD Performed: anesthesiologist  Preanesthetic Checklist Completed: patient identified, surgical consent, pre-op evaluation, timeout performed, IV checked, risks and benefits discussed and monitors and equipment checked Spinal Block Patient position: sitting Prep: DuraPrep Patient monitoring: cardiac monitor, continuous pulse ox and blood pressure Approach: right paramedian Location: L3-4 Injection technique: single-shot Needle Needle type: Quincke  Needle gauge: 22 G Needle length: 9 cm Assessment Sensory level: T10 Additional Notes Functioning IV was confirmed and monitors were applied. Sterile prep and drape, including hand hygiene and sterile gloves were used. The patient was positioned and the spine was prepped. The skin was anesthetized with lidocaine.  Free flow of clear CSF was obtained prior to injecting local anesthetic into the CSF.  The spinal needle aspirated freely following injection.  The needle was carefully withdrawn.  The patient tolerated the procedure well.

## 2019-07-26 NOTE — Interval H&P Note (Signed)
History and Physical Interval Note:  07/26/2019 7:27 AM  Claudia Lara  has presented today for surgery, with the diagnosis of left knee osteoarthritis.  The various methods of treatment have been discussed with the patient and family. After consideration of risks, benefits and other options for treatment, the patient has consented to  Procedure(s) with comments: TOTAL KNEE ARTHROPLASTY (Left) - 50min as a surgical intervention.  The patient's history has been reviewed, patient examined, no change in status, stable for surgery.  I have reviewed the patient's chart and labs.  Questions were answered to the patient's satisfaction.     Pilar Plate Akaiya Touchette

## 2019-07-26 NOTE — Anesthesia Procedure Notes (Signed)
Anesthesia Regional Block: Adductor canal block   Pre-Anesthetic Checklist: ,, timeout performed, Correct Patient, Correct Site, Correct Laterality, Correct Procedure, Correct Position, site marked, Risks and benefits discussed,  Surgical consent,  Pre-op evaluation,  At surgeon's request and post-op pain management  Laterality: Left  Prep: chloraprep       Needles:  Injection technique: Single-shot  Needle Type: Echogenic Needle     Needle Length: 9cm  Needle Gauge: 21     Additional Needles:   Narrative:  Start time: 07/26/2019 8:57 AM End time: 07/26/2019 9:02 AM Injection made incrementally with aspirations every 5 mL.  Performed by: Personally  Anesthesiologist: Albertha Ghee, MD  Additional Notes: Pt tolerated the procedure well.

## 2019-07-26 NOTE — Anesthesia Postprocedure Evaluation (Signed)
Anesthesia Post Note  Patient: Claudia Lara  Procedure(s) Performed: TOTAL KNEE ARTHROPLASTY (Left Knee)     Patient location during evaluation: PACU Anesthesia Type: Spinal Level of consciousness: oriented and awake and alert Pain management: pain level controlled Vital Signs Assessment: post-procedure vital signs reviewed and stable Respiratory status: spontaneous breathing, respiratory function stable and patient connected to nasal cannula oxygen Cardiovascular status: blood pressure returned to baseline and stable Postop Assessment: no headache, no backache and no apparent nausea or vomiting Anesthetic complications: no    Last Vitals:  Vitals:   07/26/19 1616 07/26/19 2101  BP: 134/70 (!) 154/63  Pulse: 71 77  Resp: 16 14  Temp: 36.6 C 36.4 C  SpO2: 97% 100%    Last Pain:  Vitals:   07/26/19 2101  TempSrc: Oral  PainSc:                  [place]

## 2019-07-27 ENCOUNTER — Encounter (HOSPITAL_COMMUNITY): Payer: Self-pay | Admitting: Orthopedic Surgery

## 2019-07-27 DIAGNOSIS — I10 Essential (primary) hypertension: Secondary | ICD-10-CM | POA: Diagnosis present

## 2019-07-27 DIAGNOSIS — I2510 Atherosclerotic heart disease of native coronary artery without angina pectoris: Secondary | ICD-10-CM | POA: Diagnosis present

## 2019-07-27 DIAGNOSIS — Z96651 Presence of right artificial knee joint: Secondary | ICD-10-CM | POA: Diagnosis present

## 2019-07-27 DIAGNOSIS — M1712 Unilateral primary osteoarthritis, left knee: Secondary | ICD-10-CM | POA: Diagnosis present

## 2019-07-27 DIAGNOSIS — Z90710 Acquired absence of both cervix and uterus: Secondary | ICD-10-CM | POA: Diagnosis not present

## 2019-07-27 DIAGNOSIS — E785 Hyperlipidemia, unspecified: Secondary | ICD-10-CM | POA: Diagnosis present

## 2019-07-27 DIAGNOSIS — E1151 Type 2 diabetes mellitus with diabetic peripheral angiopathy without gangrene: Secondary | ICD-10-CM | POA: Diagnosis present

## 2019-07-27 DIAGNOSIS — K219 Gastro-esophageal reflux disease without esophagitis: Secondary | ICD-10-CM | POA: Diagnosis present

## 2019-07-27 LAB — CBC
HCT: 25.1 % — ABNORMAL LOW (ref 36.0–46.0)
Hemoglobin: 8.2 g/dL — ABNORMAL LOW (ref 12.0–15.0)
MCH: 32 pg (ref 26.0–34.0)
MCHC: 32.7 g/dL (ref 30.0–36.0)
MCV: 98 fL (ref 80.0–100.0)
Platelets: 173 10*3/uL (ref 150–400)
RBC: 2.56 MIL/uL — ABNORMAL LOW (ref 3.87–5.11)
RDW: 19.1 % — ABNORMAL HIGH (ref 11.5–15.5)
WBC: 12.7 10*3/uL — ABNORMAL HIGH (ref 4.0–10.5)
nRBC: 0 % (ref 0.0–0.2)

## 2019-07-27 LAB — BASIC METABOLIC PANEL
Anion gap: 8 (ref 5–15)
BUN: 25 mg/dL — ABNORMAL HIGH (ref 8–23)
CO2: 24 mmol/L (ref 22–32)
Calcium: 8.6 mg/dL — ABNORMAL LOW (ref 8.9–10.3)
Chloride: 109 mmol/L (ref 98–111)
Creatinine, Ser: 1.16 mg/dL — ABNORMAL HIGH (ref 0.44–1.00)
GFR calc Af Amer: 50 mL/min — ABNORMAL LOW (ref >60)
GFR calc non Af Amer: 43 mL/min — ABNORMAL LOW (ref >60)
Glucose, Bld: 124 mg/dL — ABNORMAL HIGH (ref 70–99)
Potassium: 4.4 mmol/L (ref 3.5–5.1)
Sodium: 141 mmol/L (ref 135–145)

## 2019-07-27 NOTE — Progress Notes (Signed)
Physical Therapy Treatment Patient Details Name: Claudia Lara MRN: [id number] DOB: Jul 19, 1932 Today's Date: 07/27/2019    History of Present Illness 85 yo female s/p L TKA; PMH: R TKA, HTN, PVD, R posterior THA    PT Comments    Progressing well this pm. Should be ready for d/c tomorrow. Continue PT POC  Follow Up Recommendations  Follow surgeon's recommendation for DC plan and follow-up therapies     Equipment Recommendations  None recommended by PT    Recommendations for Other Services       Precautions / Restrictions Precautions Precautions: Fall;Knee Required Braces or Orthoses: Knee Immobilizer - Left Knee Immobilizer - Left: Discontinue once straight leg raise with < 10 degree lag Restrictions Weight Bearing Restrictions: No Other Position/Activity Restrictions: WBAT    Mobility  Bed Mobility Overal bed mobility: Needs Assistance Bed Mobility: Sit to Supine       Sit to supine: Supervision   General bed mobility comments: for safety  Transfers Overall transfer level: Needs assistance Equipment used: Rolling walker (2 wheeled) Transfers: Sit to/from Stand Sit to Stand: Min guard;Supervision         General transfer comment: cues for hand placement and LLE positioning  Ambulation/Gait Ambulation/Gait assistance: Min guard;Supervision Gait Distance (Feet): 110 Feet Assistive device: Rolling walker (2 wheeled) Gait Pattern/deviations: Step-to pattern;Decreased weight shift to left     General Gait Details: cues for sequence and RW position, improving wt shift, beginning step through gait    Stairs             Wheelchair Mobility    Modified Rankin (Stroke Patients Only)       Balance                                            Cognition Arousal/Alertness: Awake/alert Behavior During Therapy: WFL for tasks assessed/performed Overall Cognitive Status: Within Functional Limits for tasks assessed                                         Exercises Total Joint Exercises Ankle Circles/Pumps: AROM;10 reps;Both Quad Sets: AROM;Both;10 reps Short Arc Quad: AROM;Left;Strengthening;10 reps Heel Slides: AAROM;Left;10 reps Hip ABduction/ADduction: AROM;AAROM;Left;10 reps Straight Leg Raises: AROM;AAROM;Left;10 reps Goniometric ROM: ~ 10 to 60 degrees    General Comments        Pertinent Vitals/Pain Pain Assessment: 0-10 Pain Score: 4  Pain Location: left knee Pain Descriptors / Indicators: Grimacing;Sore Pain Intervention(s): Limited activity within patient's tolerance;Monitored during session;Repositioned;Ice applied    Home Living                      Prior Function            PT Goals (current goals can now be found in the care plan section) Acute Rehab PT Goals PT Goal Formulation: With patient Time For Goal Achievement: 08/02/19 Potential to Achieve Goals: Good    Frequency    7X/week      PT Plan Current plan remains appropriate    Co-evaluation              AM-PAC PT "6 Clicks" Mobility   Outcome Measure  Help needed turning from your back to your side while in a flat bed without using bedrails?: A  Little Help needed moving from lying on your back to sitting on the side of a flat bed without using bedrails?: A Little Help needed moving to and from a bed to a chair (including a wheelchair)?: A Little Help needed standing up from a chair using your arms (e.g., wheelchair or bedside chair)?: A Little Help needed to walk in hospital room?: A Little Help needed climbing 3-5 steps with a railing? : A Little 6 Click Score: 18    End of Session Equipment Utilized During Treatment: Gait belt Activity Tolerance: Patient tolerated treatment well Patient left: with call bell/phone within reach;in bed;with bed alarm set   PT Visit Diagnosis: Difficulty in walking, not elsewhere classified (R26.2)     Time: 1540-1601 PT Time Calculation (min)  (ACUTE ONLY): 21 min  Charges:  $Therapeutic Exercise: 8-22 mins                     Kenyon Ana, PT  Pager: [phone] Acute Rehab Dept [facility]): [phone]   07/27/2019    [facility] 07/27/2019, 4:06 PM

## 2019-07-27 NOTE — Progress Notes (Signed)
Subjective: 1 Day Post-Op Procedure(s) (LRB): TOTAL KNEE ARTHROPLASTY (Left) Patient reports pain as mild.   Patient seen in rounds by Dr. Wynelle Link. Patient is well, and has had no acute complaints or problems other than discomfort in the left knee. No acute events overnight. Foley catheter removed, positive flatus.  We will continue therapy today.   Objective: Vital signs in last 24 hours: Temp:  [97.6 F (36.4 C)-98.5 F (36.9 C)] 97.7 F (36.5 C) (09/01 0515) Pulse Rate:  [67-86] 86 (09/01 0515) Resp:  [12-19] 16 (09/01 0515) BP: (108-162)/(49-108) 152/62 (09/01 0515) SpO2:  [95 %-100 %] 100 % (09/01 0515)  Intake/Output from previous day:  Intake/Output Summary (Last 24 hours) at 07/27/2019 0728 Last data filed at 07/27/2019 0630 Gross per 24 hour  Intake 3451.6 ml  Output 2165 ml  Net 1286.6 ml     Intake/Output this shift: No intake/output data recorded.  Labs: Recent Labs    07/27/19 0303  HGB 8.2*   Recent Labs    07/27/19 0303  WBC 12.7*  RBC 2.56*  HCT 25.1*  PLT 173   Recent Labs    07/27/19 0303  NA 141  K 4.4  CL 109  CO2 24  BUN 25*  CREATININE 1.16*  GLUCOSE 124*  CALCIUM 8.6*   No results for input(s): LABPT, INR in the last 72 hours.  Exam: General - Patient is Alert and Oriented Extremity - Neurologically intact Sensation intact distally Intact pulses distally Dorsiflexion/Plantar flexion intact Dressing - dressing C/D/I Motor Function - intact, moving foot and toes well on exam.   Past Medical History:  Diagnosis Date  . ANEMIA 08/22/2009  . Aortic stenosis   . AR (aortic regurgitation) 01/2019   Mild to Moderate, Noted on ECHO  . CAROTID ARTERY STENOSIS 01/16/2010  . Complication of anesthesia    very hard to wake up from surgery  . DYSPNEA ON EXERTION 11/16/2007  . Gallstones   . GEN OSTEOARTHROSIS INVOLVING MULTIPLE SITES 11/11/2008  . GERD (gastroesophageal reflux disease)   . Grade I diastolic dysfunction 123456    Noted on ECHO  . Heart murmur   . HEMORRHOIDS, INTERNAL    surgery was in the 90's (per patient)  . History of blood transfusion    after hip and knee replacements  . NECK PAIN, ACUTE 12/28/2009  . PERIPHERAL EDEMA 10/06/2007  . PONV (postoperative nausea and vomiting)   . Pulmonary nodule 06/18/2019   noted on CT Chest   . PVD 10/06/2007  . Unspecified essential hypertension 10/19/2007    Assessment/Plan: 1 Day Post-Op Procedure(s) (LRB): TOTAL KNEE ARTHROPLASTY (Left) Principal Problem:   OA (osteoarthritis) of knee  Estimated body mass index is 30.15 kg/m as calculated from the following:   Height as of this encounter: 5\' 3"  (1.6 m).   Weight as of this encounter: 77.2 kg. Advance diet Up with therapy   Patient's anticipated LOS is less than 2 midnights, meeting these requirements: - Lives within 1 hour of care - Has a competent adult at home to recover with post-op recover - NO history of  - Chronic pain requiring opiods  - Diabetes  - Coronary Artery Disease  - Heart failure  - Heart attack  - Stroke  - DVT/VTE  - Cardiac arrhythmia  - Respiratory Failure/COPD  - Renal failure  - Anemia  - Advanced Liver disease  DVT Prophylaxis - Aspirin Weight bearing as tolerated. D/C O2 and pulse ox and try on room air. Hemovac pulled  without difficulty, will begin therapy today.  Hemoglobin stable at 8.2. Plan is to go Home after hospital stay. Patient has progressed slowly with PT, and would benefit from an additional day of therapy in order to progress to safe discharge home. Likely discharge tomorrow.  Griffith Citron, PA-C Orthopedic Surgery 07/27/2019, 7:28 AM

## 2019-07-27 NOTE — Progress Notes (Signed)
Physical Therapy Treatment Patient Details Name: Claudia Lara MRN: [id number] DOB: [birth year] Today's Date: 07/27/2019    History of Present Illness 85 yo female s/p L TKA; PMH: R TKA, HTN, PVD, R posterior THA    PT Comments    Pt progressing, will benefit from another day of PT to maximize independence   Follow Up Recommendations  Follow surgeon's recommendation for DC plan and follow-up therapies     Equipment Recommendations  None recommended by PT    Recommendations for Other Services       Precautions / Restrictions Precautions Precautions: Fall;Knee Required Braces or Orthoses: Knee Immobilizer - Left Knee Immobilizer - Left: Discontinue once straight leg raise with < 10 degree lag Restrictions Weight Bearing Restrictions: No Other Position/Activity Restrictions: WBAT    Mobility  Bed Mobility Overal bed mobility: Needs Assistance Bed Mobility: Supine to Sit     Supine to sit: Supervision     General bed mobility comments: for safety  Transfers Overall transfer level: Needs assistance Equipment used: Rolling walker (2 wheeled) Transfers: Sit to/from Stand Sit to Stand: Min guard;Supervision         General transfer comment: cues for hand placement and LLE positioning  Ambulation/Gait Ambulation/Gait assistance: Min Gaffer (Feet): 120 Feet Assistive device: Rolling walker (2 wheeled) Gait Pattern/deviations: Step-to pattern;Decreased weight shift to left     General Gait Details: cues for sequence and RW position   Stairs             Wheelchair Mobility    Modified Rankin (Stroke Patients Only)       Balance                                            Cognition Arousal/Alertness: Awake/alert Behavior During Therapy: WFL for tasks assessed/performed Overall Cognitive Status: Within Functional Limits for tasks assessed                                         Exercises Total Joint Exercises Ankle Circles/Pumps: AROM;10 reps;Both    General Comments        Pertinent Vitals/Pain Pain Assessment: 0-10 Pain Score: 3  Pain Location: left knee Pain Descriptors / Indicators: Grimacing;Sore Pain Intervention(s): Limited activity within patient's tolerance;Monitored during session;Premedicated before session    Home Living                      Prior Function            PT Goals (current goals can now be found in the care plan section) Acute Rehab PT Goals PT Goal Formulation: With patient Time For Goal Achievement: 08/02/19 Potential to Achieve Goals: Good Progress towards PT goals: Progressing toward goals    Frequency    7X/week      PT Plan Current plan remains appropriate    Co-evaluation              AM-PAC PT "6 Clicks" Mobility   Outcome Measure  Help needed turning from your back to your side while in a flat bed without using bedrails?: A Little Help needed moving from lying on your back to sitting on the side of a flat bed without using bedrails?: A Little Help needed moving to and from  a bed to a chair (including a wheelchair)?: A Little Help needed standing up from a chair using your arms (e.g., wheelchair or bedside chair)?: A Little Help needed to walk in hospital room?: A Little Help needed climbing 3-5 steps with a railing? : A Little 6 Click Score: 18    End of Session Equipment Utilized During Treatment: Gait belt Activity Tolerance: Patient tolerated treatment well Patient left: in chair;with call bell/phone within reach;with chair alarm set   PT Visit Diagnosis: Difficulty in walking, not elsewhere classified (R26.2)     Time: [id number] PT Time Calculation (min) (ACUTE ONLY): 14 min  Charges:  $Gait Training: 23-37 mins                     Kenyon Ana, PT  Pager: [phone] Acute Rehab Dept [facility]): [phone]   07/27/2019    [facility] 07/27/2019, 12:00 PM

## 2019-07-27 NOTE — Plan of Care (Signed)

## 2019-07-28 LAB — BASIC METABOLIC PANEL
Anion gap: 8 (ref 5–15)
BUN: 31 mg/dL — ABNORMAL HIGH (ref 8–23)
CO2: 25 mmol/L (ref 22–32)
Calcium: 8.9 mg/dL (ref 8.9–10.3)
Chloride: 108 mmol/L (ref 98–111)
Creatinine, Ser: 1.35 mg/dL — ABNORMAL HIGH (ref 0.44–1.00)
GFR calc Af Amer: 41 mL/min — ABNORMAL LOW (ref >60)
GFR calc non Af Amer: 36 mL/min — ABNORMAL LOW (ref >60)
Glucose, Bld: 116 mg/dL — ABNORMAL HIGH (ref 70–99)
Potassium: 4.1 mmol/L (ref 3.5–5.1)
Sodium: 141 mmol/L (ref 135–145)

## 2019-07-28 LAB — CBC
HCT: 23.8 % — ABNORMAL LOW (ref 36.0–46.0)
Hemoglobin: 7.7 g/dL — ABNORMAL LOW (ref 12.0–15.0)
MCH: 31.7 pg (ref 26.0–34.0)
MCHC: 32.4 g/dL (ref 30.0–36.0)
MCV: 97.9 fL (ref 80.0–100.0)
Platelets: 173 10*3/uL (ref 150–400)
RBC: 2.43 MIL/uL — ABNORMAL LOW (ref 3.87–5.11)
RDW: 19.1 % — ABNORMAL HIGH (ref 11.5–15.5)
WBC: 11.3 10*3/uL — ABNORMAL HIGH (ref 4.0–10.5)
nRBC: 0 % (ref 0.0–0.2)

## 2019-07-28 MED ORDER — TRAMADOL HCL 50 MG PO TABS: 50.0000 mg | ORAL_TABLET | Freq: Two times a day (BID) | ORAL | 0 refills | Status: DC | PRN

## 2019-07-28 MED ORDER — GABAPENTIN 100 MG PO CAPS: 100.0000 mg | ORAL_CAPSULE | Freq: Three times a day (TID) | ORAL | 0 refills | Status: DC

## 2019-07-28 MED ORDER — FERROUS SULFATE 325 (65 FE) MG PO TABS: 325.0000 mg | ORAL_TABLET | Freq: Two times a day (BID) | ORAL | 0 refills | Status: DC

## 2019-07-28 MED ORDER — ASPIRIN 325 MG PO TBEC: 325.0000 mg | DELAYED_RELEASE_TABLET | Freq: Two times a day (BID) | ORAL | 0 refills | Status: DC

## 2019-07-28 MED ORDER — OXYCODONE HCL 5 MG PO TABS: 5.0000 mg | ORAL_TABLET | ORAL | 0 refills | Status: DC | PRN

## 2019-07-28 MED ORDER — METHOCARBAMOL 500 MG PO TABS: 500.0000 mg | ORAL_TABLET | Freq: Four times a day (QID) | ORAL | 0 refills | Status: DC | PRN

## 2019-07-28 NOTE — Progress Notes (Signed)
   Subjective: 2 Days Post-Op Procedure(s) (LRB): TOTAL KNEE ARTHROPLASTY (Left) Patient reports pain as mild.   Patient seen in rounds with Dr. Wynelle Link. Patient is well, and has had no acute complaints or problems other than poor sleep. Minimal pain in the left knee. No SOB or chest pain. No dizziness. Voiding well. Reports no BM or flatus yet.  Plan is to go Home after hospital stay.  Objective: Vital signs in last 24 hours: Temp:  [97.5 F (36.4 C)-98.4 F (36.9 C)] 97.5 F (36.4 C) (09/02 0500) Pulse Rate:  [77-86] 86 (09/02 0500) Resp:  [16-18] 17 (09/02 0500) BP: (123-163)/(52-71) 149/59 (09/02 0500) SpO2:  [93 %-99 %] 96 % (09/02 0500) Weight:  [77.2 kg] 77.2 kg (09/01 1829)  Intake/Output from previous day:  Intake/Output Summary (Last 24 hours) at 07/28/2019 0737 Last data filed at 07/28/2019 0600 Gross per 24 hour  Intake 1227.27 ml  Output 2400 ml  Net -1172.73 ml     Labs: Recent Labs    07/27/19 0303 07/28/19 0259  HGB 8.2* 7.7*   Recent Labs    07/27/19 0303 07/28/19 0259  WBC 12.7* 11.3*  RBC 2.56* 2.43*  HCT 25.1* 23.8*  PLT 173 173   Recent Labs    07/27/19 0303 07/28/19 0259  NA 141 141  K 4.4 4.1  CL 109 108  CO2 24 25  BUN 25* 31*  CREATININE 1.16* 1.35*  GLUCOSE 124* 116*  CALCIUM 8.6* 8.9    EXAM General - Patient is Alert and Oriented Extremity - Neurologically intact Intact pulses distally Dorsiflexion/Plantar flexion intact No cellulitis present Compartment soft Dressing/Incision - clean, dry, no drainage Motor Function - intact, moving foot and toes well on exam.   Past Medical History:  Diagnosis Date  . ANEMIA 08/22/2009  . Aortic stenosis   . AR (aortic regurgitation) 01/2019   Mild to Moderate, Noted on ECHO  . CAROTID ARTERY STENOSIS 01/16/2010  . Complication of anesthesia    very hard to wake up from surgery  . DYSPNEA ON EXERTION 11/16/2007  . Gallstones   . GEN OSTEOARTHROSIS INVOLVING MULTIPLE SITES  11/11/2008  . GERD (gastroesophageal reflux disease)   . Grade I diastolic dysfunction 123456   Noted on ECHO  . Heart murmur   . HEMORRHOIDS, INTERNAL    surgery was in the 90's (per patient)  . History of blood transfusion    after hip and knee replacements  . NECK PAIN, ACUTE 12/28/2009  . PERIPHERAL EDEMA 10/06/2007  . PONV (postoperative nausea and vomiting)   . Pulmonary nodule 06/18/2019   noted on CT Chest   . PVD 10/06/2007  . Unspecified essential hypertension 10/19/2007    Assessment/Plan: 2 Days Post-Op Procedure(s) (LRB): TOTAL KNEE ARTHROPLASTY (Left) Principal Problem:   OA (osteoarthritis) of knee  Estimated body mass index is 30.15 kg/m as calculated from the following:   Height as of this encounter: 5\' 3"  (1.6 m).   Weight as of this encounter: 77.2 kg. Advance diet Up with therapy  DVT Prophylaxis - Aspirin Weight-Bearing as tolerated   Continue therapy. Will increase iron for anemia. Suppository to encourage flatus and BM. Plan for DC home today if progressing well.   Ardeen Jourdain, PA-C Orthopaedic Surgery 07/28/2019, 7:37 AM

## 2019-07-28 NOTE — Discharge Summary (Signed)
Physician Discharge Summary   Patient ID: Claudia Lara MRN: [id number] DOB/AGE: 1932/08/03 85 y.o.  Admit date: 07/26/2019 Discharge date: 07/28/2019  Primary Diagnosis: Primary osteoarthritis left knee   Admission Diagnoses:  Past Medical History:  Diagnosis Date  . ANEMIA 08/22/2009  . Aortic stenosis   . AR (aortic regurgitation) 01/2019   Mild to Moderate, Noted on ECHO  . CAROTID ARTERY STENOSIS 01/16/2010  . Complication of anesthesia    very hard to wake up from surgery  . DYSPNEA ON EXERTION 11/16/2007  . Gallstones   . GEN OSTEOARTHROSIS INVOLVING MULTIPLE SITES 11/11/2008  . GERD (gastroesophageal reflux disease)   . Grade I diastolic dysfunction [id number]   Noted on ECHO  . Heart murmur   . HEMORRHOIDS, INTERNAL    surgery was in the 90's (per patient)  . History of blood transfusion    after hip and knee replacements  . NECK PAIN, ACUTE 12/28/2009  . PERIPHERAL EDEMA 10/06/2007  . PONV (postoperative nausea and vomiting)   . Pulmonary nodule 06/18/2019   noted on CT Chest   . PVD 10/06/2007  . Unspecified essential hypertension 10/19/2007   Discharge Diagnoses:   Principal Problem:   OA (osteoarthritis) of knee  Estimated body mass index is 30.15 kg/m as calculated from the following:   Height as of this encounter: 5\' 3"  (1.6 m).   Weight as of this encounter: 77.2 kg.  Procedure(s) (LRB): TOTAL KNEE ARTHROPLASTY (Left)   Consults: None  HPI: Claudia Lara, 85 y.o. female, has a history of pain and functional disability in the left knee due to arthritis and has failed non-surgical conservative treatments for greater than 12 weeks to includecorticosteriod injections, viscosupplementation injections and activity modification.  Onset of symptoms was gradual, starting 1 years ago with gradually worsening course since that time. The patient noted no past surgery on the left knee(s).  Patient currently rates pain in the left knee(s) at 10 out of 10 with activity.  Patient has worsening of pain with activity and weight bearing and pain that interferes with activities of daily living.  Patient has evidence of bone on bone degenerative changes in the medial and patellofemoral compartments. by imaging studies.There is no active infection.  Laboratory Data: Admission on 07/26/2019, Discharged on 07/28/2019  Component Date Value Ref Range Status  . WBC 07/27/2019 12.7* 4.0 - 10.5 K/uL Final  . RBC 07/27/2019 2.56* 3.87 - 5.11 MIL/uL Final  . Hemoglobin 07/27/2019 8.2* 12.0 - 15.0 g/dL Final  . HCT 07/27/2019 25.1* 36.0 - 46.0 % Final  . MCV 07/27/2019 98.0  80.0 - 100.0 fL Final  . MCH 07/27/2019 32.0  26.0 - 34.0 pg Final  . MCHC 07/27/2019 32.7  30.0 - 36.0 g/dL Final  . RDW 07/27/2019 19.1* 11.5 - 15.5 % Final  . Platelets 07/27/2019 173  150 - 400 K/uL Final  . nRBC 07/27/2019 0.0  0.0 - 0.2 % Final   Performed at [facility], [place] [address], [place], [place] [address]  . Sodium 07/27/2019 141  135 - 145 mmol/L Final  . Potassium 07/27/2019 4.4  3.5 - 5.1 mmol/L Final  . Chloride 07/27/2019 109  98 - 111 mmol/L Final  . CO2 07/27/2019 24  22 - 32 mmol/L Final  . Glucose, Bld 07/27/2019 124* 70 - 99 mg/dL Final  . BUN 07/27/2019 25* 8 - 23 mg/dL Final  . Creatinine, Ser 07/27/2019 1.16* 0.44 - 1.00 mg/dL Final  . Calcium 07/27/2019 8.6* 8.9 -  10.3 mg/dL Final  . GFR calc non Af Amer 07/27/2019 43* >60 mL/min Final  . GFR calc Af Amer 07/27/2019 50* >60 mL/min Final  . Anion gap 07/27/2019 8  5 - 15 Final   Performed at [facility], [place] [address] [place], [place] [address]  . WBC 07/28/2019 11.3* 4.0 - 10.5 K/uL Final  . RBC 07/28/2019 2.43* 3.87 - 5.11 MIL/uL Final  . Hemoglobin 07/28/2019 7.7* 12.0 - 15.0 g/dL Final  . HCT 07/28/2019 23.8* 36.0 - 46.0 % Final  . MCV 07/28/2019 97.9  80.0 - 100.0 fL Final  . MCH 07/28/2019 31.7  26.0 - 34.0 pg Final  . MCHC 07/28/2019 32.4  30.0 - 36.0 g/dL Final  . RDW  07/28/2019 19.1* 11.5 - 15.5 % Final  . Platelets 07/28/2019 173  150 - 400 K/uL Final  . nRBC 07/28/2019 0.0  0.0 - 0.2 % Final   Performed at [facility], [place] [address] [place], [place] [address]  . Sodium 07/28/2019 141  135 - 145 mmol/L Final  . Potassium 07/28/2019 4.1  3.5 - 5.1 mmol/L Final  . Chloride 07/28/2019 108  98 - 111 mmol/L Final  . CO2 07/28/2019 25  22 - 32 mmol/L Final  . Glucose, Bld 07/28/2019 116* 70 - 99 mg/dL Final  . BUN 07/28/2019 31* 8 - 23 mg/dL Final  . Creatinine, Ser 07/28/2019 1.35* 0.44 - 1.00 mg/dL Final  . Calcium 07/28/2019 8.9  8.9 - 10.3 mg/dL Final  . GFR calc non Af Amer 07/28/2019 36* >60 mL/min Final  . GFR calc Af Amer 07/28/2019 41* >60 mL/min Final  . Anion gap 07/28/2019 8  5 - 15 Final   Performed at [facility], [place] [address] [place], [place] [address]  Hospital Outpatient Visit on 07/22/2019  Component Date Value Ref Range Status  . SARS Coronavirus 2 07/22/2019 NEGATIVE  NEGATIVE Final   Comment: (NOTE) SARS-CoV-2 target nucleic acids are NOT DETECTED. The SARS-CoV-2 RNA is generally detectable in upper and lower respiratory specimens during the acute phase of infection. Negative results do not preclude SARS-CoV-2 infection, do not rule out co-infections with other pathogens, and should not be used as the sole basis for treatment or other patient management decisions. Negative results must be combined with clinical observations, patient history, and epidemiological information. The expected result is Negative. Fact Sheet for Patients: SugarRoll.be Fact Sheet for Healthcare Providers: https://www.woods-mathews.com/ This test is not yet approved or cleared by the [place] FDA and  has been authorized for detection and/or diagnosis of SARS-CoV-2 by FDA under an Emergency Use Authorization (EUA). This EUA will remain  in effect (meaning this test  can be used) for the duration of the COVID-19 declaration under Section 56                          4(b)(1) of the Act, 21 U.S.C. section 360bbb-3(b)(1), unless the authorization is terminated or revoked sooner. Performed at [facility], [place] [address], [place], [place] [address]   Hospital Outpatient Visit on 07/21/2019  Component Date Value Ref Range Status  . aPTT 07/21/2019 33  24 - 36 seconds Final   Performed at [facility], [place] [address] [place],  [address]  . WBC 07/21/2019 7.5  4.0 - 10.5 K/uL Final  . RBC 07/21/2019 2.88* 3.87 - 5.11 MIL/uL Final  . Hemoglobin 07/21/2019 9.0* 12.0 - 15.0  g/dL Final  . HCT 07/21/2019 28.1* 36.0 - 46.0 % Final  . MCV 07/21/2019 97.6  80.0 - 100.0 fL Final  . MCH 07/21/2019 31.3  26.0 - 34.0 pg Final  . MCHC 07/21/2019 32.0  30.0 - 36.0 g/dL Final  . RDW 07/21/2019 20.0* 11.5 - 15.5 % Final  . Platelets 07/21/2019 222  150 - 400 K/uL Final  . nRBC 07/21/2019 0.0  0.0 - 0.2 % Final   Performed at [facility], [place] [address] [place], [place] [address]  . Sodium 07/21/2019 140  135 - 145 mmol/L Final  . Potassium 07/21/2019 4.6  3.5 - 5.1 mmol/L Final  . Chloride 07/21/2019 106  98 - 111 mmol/L Final  . CO2 07/21/2019 24  22 - 32 mmol/L Final  . Glucose, Bld 07/21/2019 106* 70 - 99 mg/dL Final  . BUN 07/21/2019 35* 8 - 23 mg/dL Final  . Creatinine, Ser 07/21/2019 1.56* 0.44 - 1.00 mg/dL Final  . Calcium 07/21/2019 9.0  8.9 - 10.3 mg/dL Final  . Total Protein 07/21/2019 7.0  6.5 - 8.1 g/dL Final  . Albumin 07/21/2019 4.4  3.5 - 5.0 g/dL Final  . AST 07/21/2019 19  15 - 41 U/L Final  . ALT 07/21/2019 16  0 - 44 U/L Final  . Alkaline Phosphatase 07/21/2019 69  38 - 126 U/L Final  . Total Bilirubin 07/21/2019 3.2* 0.3 - 1.2 mg/dL Final  . GFR calc non Af Amer 07/21/2019 30* >60 mL/min Final  . GFR calc Af Amer 07/21/2019 35* >60 mL/min Final  . Anion gap 07/21/2019 10  5 - 15 Final    Performed at [facility], [place] [address] [place], [place] [address]  . Prothrombin Time 07/21/2019 14.1  11.4 - 15.2 seconds Final  . INR 07/21/2019 1.1  0.8 - 1.2 Final   Comment: (NOTE) INR goal varies based on device and disease states. Performed at [facility], [place] [address], [place], [place] [address]   . ABO/RH(D) 07/21/2019 A POS   Final  . Antibody Screen 07/21/2019 NEG   Final  . Sample Expiration 07/21/2019 07/29/2019,2359   Final  . Extend sample reason 07/21/2019    Final                   Value:NO TRANSFUSIONS OR PREGNANCY IN THE PAST 3 MONTHS Performed at [place] [address], [place], [place] [address]   . MRSA, PCR 07/21/2019 NEGATIVE  NEGATIVE Final  . Staphylococcus aureus 07/21/2019 NEGATIVE  NEGATIVE Final   Comment: (NOTE) The Xpert SA Assay (FDA approved for NASAL specimens in patients 24 years of age and older), is one component of a comprehensive surveillance program. It is not intended to diagnose infection nor to guide or monitor treatment. Performed at [facility], [place] [address] [place], [place] [address]   Hospital Outpatient Visit on 07/02/2019  Component Date Value Ref Range Status  . Glucose-Capillary 07/02/2019 115* 70 - 99 mg/dL Final  Appointment on 06/08/2019  Component Date Value Ref Range Status  . MICRO NUMBER: 06/08/2019 [id number]   Final  . SPECIMEN QUALITY: 06/08/2019 Adequate   Final  . Source 06/08/2019 NOT GIVEN   Final  . STATUS: 06/08/2019 FINAL   Final  . RESULT: 06/08/2019 Three or more organisms present, each greater than 10,000 CFU/mL. May represent normal flora contamination from external genitalia. No further testing is required.   Final  . Color,  Urine 06/08/2019 YELLOW  Yellow;Lt. Yellow;Straw;Dark Yellow;Jaana Brodt;Green;Red;Brown Final  . APPearance 06/08/2019 CLEAR  Clear;Turbid;Slightly Cloudy;Cloudy Final  . Specific Gravity, Urine  06/08/2019 1.010  1.000 - 1.030 Final  . pH 06/08/2019 6.5  5.0 - 8.0 Final  . Total Protein, Urine 06/08/2019 NEGATIVE  Negative Final  . Urine Glucose 06/08/2019 NEGATIVE  Negative Final  . Ketones, ur 06/08/2019 NEGATIVE  Negative Final  . Bilirubin Urine 06/08/2019 NEGATIVE  Negative Final  . Hgb urine dipstick 06/08/2019 NEGATIVE  Negative Final  . Urobilinogen, UA 06/08/2019 0.2  0.0 - 1.0 Final  . Leukocytes,Ua 06/08/2019 TRACE* Negative Final  . Nitrite 06/08/2019 NEGATIVE  Negative Final  . WBC, UA 06/08/2019 0-2/hpf  0-2/hpf Final  . RBC / HPF 06/08/2019 0-2/hpf  0-2/hpf Final  . Squamous Epithelial / LPF 06/08/2019 Rare(0-4/hpf)  Rare(0-4/hpf) Final  . Hgb A1c MFr Bld 06/08/2019 4.8  4.6 - 6.5 % Final   Glycemic Control Guidelines for People with Diabetes:Non Diabetic:  <6%Goal of Therapy: <7%Additional Action Suggested:  >8%   . Zinc 06/08/2019 70  60 - 130 mcg/dL Final   Comment: . This test was developed and its analytical performance characteristics have been determined by [place], [place]. It has not been cleared or approved by the U.S. Food and Drug Administration. This assay has been validated pursuant to the CLIA regulations and is used for clinical purposes. .   . Cholesterol 06/08/2019 120  0 - 200 mg/dL Final   ATP III Classification       Desirable:  < 200 mg/dL               Borderline High:  200 - 239 mg/dL          High:  > = 240 mg/dL  . Triglycerides 06/08/2019 104.0  0.0 - 149.0 mg/dL Final   Normal:  <150 mg/dLBorderline High:  150 - 199 mg/dL  . HDL 06/08/2019 53.90  >39.00 mg/dL Final  . VLDL 06/08/2019 20.8  0.0 - 40.0 mg/dL Final  . LDL Cholesterol 06/08/2019 45  0 - 99 mg/dL Final  . Total CHOL/HDL Ratio 06/08/2019 2   Final                  Men          Women1/2 Average Risk     3.4          3.3Average Risk          5.0          4.42X Average Risk          9.6          7.13X Average Risk          15.0          11.0                       . NonHDL 06/08/2019 65.84   Final   NOTE:  Non-HDL goal should be 30 mg/dL higher than patient's LDL goal (i.e. LDL goal of < 70 mg/dL, would have non-HDL goal of < 100 mg/dL)  . Sodium 06/08/2019 141  135 - 145 mEq/L Final  . Potassium 06/08/2019 4.5  3.5 - 5.1 mEq/L Final  . Chloride 06/08/2019 105  96 - 112 mEq/L Final  . CO2 06/08/2019 30  19 - 32 mEq/L Final  . Glucose, Bld 06/08/2019 97  70 - 99 mg/dL Final  . BUN 06/08/2019  23  6 - 23 mg/dL Final  . Creatinine, Ser 06/08/2019 1.28* 0.40 - 1.20 mg/dL Final  . Calcium 06/08/2019 9.0  8.4 - 10.5 mg/dL Final  . GFR 06/08/2019 39.61* >60.00 mL/min Final  . WBC 06/08/2019 8.7  4.0 - 10.5 K/uL Final  . RBC 06/08/2019 3.10* 3.87 - 5.11 Mil/uL Final  . Hemoglobin 06/08/2019 9.9* 12.0 - 15.0 g/dL Final  . HCT 06/08/2019 28.3* 36.0 - 46.0 % Final  . MCV 06/08/2019 91.4  78.0 - 100.0 fl Final  . MCHC 06/08/2019 34.8  30.0 - 36.0 g/dL Final  . RDW 06/08/2019 19.8* 11.5 - 15.5 % Final  . Platelets 06/08/2019 207.0  150.0 - 400.0 K/uL Final  . Neutrophils Relative % 06/08/2019 70.7  43.0 - 77.0 % Final  . Lymphocytes Relative 06/08/2019 13.2  12.0 - 46.0 % Final  . Monocytes Relative 06/08/2019 8.8  3.0 - 12.0 % Final  . Eosinophils Relative 06/08/2019 6.5* 0.0 - 5.0 % Final  . Basophils Relative 06/08/2019 0.8  0.0 - 3.0 % Final  . Neutro Abs 06/08/2019 6.1  1.4 - 7.7 K/uL Final  . Lymphs Abs 06/08/2019 1.1  0.7 - 4.0 K/uL Final  . Monocytes Absolute 06/08/2019 0.8  0.1 - 1.0 K/uL Final  . Eosinophils Absolute 06/08/2019 0.6  0.0 - 0.7 K/uL Final  . Basophils Absolute 06/08/2019 0.1  0.0 - 0.1 K/uL Final     X-Rays:Nm Pet Image Initial (pi) Skull Base To Thigh  Result Date: 07/02/2019 CLINICAL DATA:  Initial treatment strategy for right upper lobe pulmonary nodule. EXAM: NUCLEAR MEDICINE PET SKULL BASE TO THIGH TECHNIQUE: 8.6 mCi F-18 FDG was injected intravenously. Full-ring PET imaging was performed from the skull  base to thigh after the radiotracer. CT data was obtained and used for attenuation correction and anatomic localization. Fasting blood glucose: 115 mg/dl COMPARISON:  06/18/2019 high-resolution chest CT. FINDINGS: Mediastinal blood pool activity: SUV max 2.9 Liver activity: SUV max NA NECK: No hypermetabolic lymph nodes in the neck. Incidental CT findings: none CHEST: No enlarged or hypermetabolic axillary, mediastinal or hilar lymph nodes. No hypermetabolic pulmonary findings. No significant metabolism (max SUV 0.8) associated with the solid 0.9 cm basilar right upper lobe pulmonary nodule (series 8/image 30). Incidental CT findings: No acute consolidative airspace disease or additional significant pulmonary nodules. Coronary atherosclerosis. Atherosclerotic nonaneurysmal thoracic aorta. Trace dependent bilateral pleural effusions, left greater than right. ABDOMEN/PELVIS: No abnormal hypermetabolic activity within the liver, pancreas, adrenal glands, or spleen. No hypermetabolic lymph nodes in the abdomen or pelvis. Incidental CT findings: Small hiatal hernia. Atherosclerotic nonaneurysmal abdominal aorta. Cholecystectomy. Mild sigmoid diverticulosis. Hysterectomy. SKELETON: No focal hypermetabolic activity to suggest skeletal metastasis. Asymmetric mild generalized uptake throughout the right shoulder joint is most compatible with inflammatory uptake. Incidental CT findings: Right total hip arthroplasty. IMPRESSION: 1. No significant metabolism (max SUV 0.8) associated with the solid 0.9 cm basilar right upper lobe pulmonary nodule. Given slow growth of this nodule compared to 05/08/2018 chest CT, an indolent/low-grade neoplasm cannot be excluded, and follow-up chest CT is suggested in 6-12 months. 2. No hypermetabolic thoracic adenopathy or other worrisome hypermetabolic findings. 3. Trace dependent bilateral pleural effusions, left greater than right. 4. Aortic Atherosclerosis (ICD10-I70.0). Additional chronic  findings as detailed. Electronically Signed   By: Ilona Sorrel M.D.   On: 07/02/2019 14:21    EKG: Orders placed or performed in visit on 06/21/19  . EKG 12-Lead     Hospital Course: Patient was admitted to [facility]  and taken to the OR and underwent the above state procedure without complications.  Patient tolerated the procedure well and was later transferred to the recovery room and then to the orthopaedic floor for postoperative care.  They were given PO and IV analgesics for pain control following their surgery.  They were given 24 hours of postoperative antibiotics of  Anti-infectives (From admission, onward)   Start     Dose/Rate Route Frequency Ordered Stop   07/26/19 1600  ceFAZolin (ANCEF) IVPB 1 g/50 mL premix     1 g 100 mL/hr over 30 Minutes Intravenous Every 6 hours 07/26/19 1323 07/26/19 2131   07/26/19 0715  ceFAZolin (ANCEF) IVPB 2g/100 mL premix     2 g 200 mL/hr over 30 Minutes Intravenous On call to O.R. 07/26/19 [id number] 07/26/19 0929     and started on DVT prophylaxis in the form of Aspirin.   PT and OT were ordered for total hip protocol.  The patient was allowed to be WBAT with therapy. Discharge planning was consulted to help with postop disposition and equipment needs.  Patient had a fair night on the evening of surgery.  They started to get up OOB with therapy on day one.  Hemovac drain was pulled without difficulty.  Continued to work with therapy into day two.  Dressing was changed on day two and the incision was clean and dry.  The patient had progressed with therapy and meeting their goals.  Incision was healing well.  Patient was seen in rounds and was ready to go home.  Diet: Cardiac diet Activity:WBAT Follow-up:in 2 weeks Disposition - Home Discharged Condition: stable   Discharge Instructions    Call MD / Call 911   Complete by: As directed    If you experience chest pain or shortness of breath, CALL 911 and be transported to the hospital  emergency room.  If you develope a fever above 101 F, pus (white drainage) or increased drainage or redness at the wound, or calf pain, call your surgeon's office.   Constipation Prevention   Complete by: As directed    Drink plenty of fluids.  Prune juice may be helpful.  You may use a stool softener, such as Colace (over the counter) 100 mg twice a day.  Use MiraLax (over the counter) for constipation as needed.   Diet - low sodium heart healthy   Complete by: As directed    Discharge instructions   Complete by: As directed    Dr. Gaynelle Arabian Total Joint Specialist [facility] [address], [place], [place] [address] [phone]  TOTAL KNEE REPLACEMENT POSTOPERATIVE DIRECTIONS  Knee Rehabilitation, Guidelines Following Surgery  Results after knee surgery are often greatly improved when you follow the exercise, range of motion and muscle strengthening exercises prescribed by your doctor. Safety measures are also important to protect the knee from further injury. Any time any of these exercises cause you to have increased pain or swelling in your knee joint, decrease the amount until you are comfortable again and slowly increase them. If you have problems or questions, call your caregiver or physical therapist for advice.   HOME CARE INSTRUCTIONS  Remove items at home which could result in a fall. This includes throw rugs or furniture in walking pathways.  ICE to the affected knee every three hours for 30 minutes at a time and then as needed for pain and swelling.  Continue to use ice on the knee for pain and swelling from  surgery. You may notice swelling that will progress down to the foot and ankle.  This is normal after surgery.  Elevate the leg when you are not up walking on it.   Continue to use the breathing machine which will help keep your temperature down.  It is common for your temperature to cycle up and down following surgery, especially at night when you are not  up moving around and exerting yourself.  The breathing machine keeps your lungs expanded and your temperature down. Do not place pillow under knee, focus on keeping the knee straight while resting   DIET You may resume your previous home diet once your are discharged from the hospital.  DRESSING / WOUND CARE / SHOWERING You may shower 3 days after surgery, but keep the wounds dry during showering.  You may use an occlusive plastic wrap (Press'n Seal for example), NO SOAKING/SUBMERGING IN THE BATHTUB.  If the bandage gets wet, change with a clean dry gauze.  If the incision gets wet, pat the wound dry with a clean towel. You may start showering once you are discharged home but do not submerge the incision under water. Just pat the incision dry and apply a dry gauze dressing on daily. Change the surgical dressing daily and reapply a dry dressing each time.  ACTIVITY Walk with your walker as instructed. Use walker as long as suggested by your caregivers. Avoid periods of inactivity such as sitting longer than an hour when not asleep. This helps prevent blood clots.  You may resume a sexual relationship in one month or when given the OK by your doctor.  You may return to work once you are cleared by your doctor.  Do not drive a car for 6 weeks or until released by you surgeon.  Do not drive while taking narcotics.  WEIGHT BEARING Weight bearing as tolerated with assist device (walker, cane, etc) as directed, use it as long as suggested by your surgeon or therapist, typically at least 4-6 weeks.  POSTOPERATIVE CONSTIPATION PROTOCOL Constipation - defined medically as fewer than three stools per week and severe constipation as less than one stool per week.  One of the most common issues patients have following surgery is constipation.  Even if you have a regular bowel pattern at home, your normal regimen is likely to be disrupted due to multiple reasons following surgery.  Combination of  anesthesia, postoperative narcotics, change in appetite and fluid intake all can affect your bowels.  In order to avoid complications following surgery, here are some recommendations in order to help you during your recovery period.  Colace (docusate) - Pick up an over-the-counter form of Colace or another stool softener and take twice a day as long as you are requiring postoperative pain medications.  Take with a full glass of water daily.  If you experience loose stools or diarrhea, hold the colace until you stool forms back up.  If your symptoms do not get better within 1 week or if they get worse, check with your doctor.  Dulcolax (bisacodyl) - Pick up over-the-counter and take as directed by the product packaging as needed to assist with the movement of your bowels.  Take with a full glass of water.  Use this product as needed if not relieved by Colace only.   MiraLax (polyethylene glycol) - Pick up over-the-counter to have on hand.  MiraLax is a solution that will increase the amount of water in your bowels to assist with bowel movements.  Take as directed and can mix with a glass of water, juice, soda, coffee, or tea.  Take if you go more than two days without a movement. Do not use MiraLax more than once per day. Call your doctor if you are still constipated or irregular after using this medication for 7 days in a row.  If you continue to have problems with postoperative constipation, please contact the office for further assistance and recommendations.  If you experience "the worst abdominal pain ever" or develop nausea or vomiting, please contact the office immediatly for further recommendations for treatment.  ITCHING  If you experience itching with your medications, try taking only a single pain pill, or even half a pain pill at a time.  You can also use Benadryl over the counter for itching or also to help with sleep.   TED HOSE STOCKINGS Wear the elastic stockings on both legs for three  weeks following surgery during the day but you may remove then at night for sleeping.  MEDICATIONS See your medication summary on the "After Visit Summary" that the nursing staff will review with you prior to discharge.  You may have some home medications which will be placed on hold until you complete the course of blood thinner medication.  It is important for you to complete the blood thinner medication as prescribed by your surgeon.  Continue your approved medications as instructed at time of discharge.  PRECAUTIONS If you experience chest pain or shortness of breath - call 911 immediately for transfer to the hospital emergency department.  If you develop a fever greater that 101 F, purulent drainage from wound, increased redness or drainage from wound, foul odor from the wound/dressing, or calf pain - CONTACT YOUR SURGEON.                                                   FOLLOW-UP APPOINTMENTS Make sure you keep all of your appointments after your operation with your surgeon and caregivers. You should call the office at the above phone number and make an appointment for approximately two weeks after the date of your surgery or on the date instructed by your surgeon outlined in the "After Visit Summary".   RANGE OF MOTION AND STRENGTHENING EXERCISES  Rehabilitation of the knee is important following a knee injury or an operation. After just a few days of immobilization, the muscles of the thigh which control the knee become weakened and shrink (atrophy). Knee exercises are designed to build up the tone and strength of the thigh muscles and to improve knee motion. Often times heat used for twenty to thirty minutes before working out will loosen up your tissues and help with improving the range of motion but do not use heat for the first two weeks following surgery. These exercises can be done on a training (exercise) mat, on the floor, on a table or on a bed. Use what ever works the best and is  most comfortable for you Knee exercises include:  Leg Lifts - While your knee is still immobilized in a splint or cast, you can do straight leg raises. Lift the leg to 60 degrees, hold for 3 sec, and slowly lower the leg. Repeat 10-20 times 2-3 times daily. Perform this exercise against resistance later as your knee gets better.  Quad and Hamstring Sets -  Tighten up the muscle on the front of the thigh (Quad) and hold for 5-10 sec. Repeat this 10-20 times hourly. Hamstring sets are done by pushing the foot backward against an object and holding for 5-10 sec. Repeat as with quad sets.  Leg Slides: Lying on your back, slowly slide your foot toward your buttocks, bending your knee up off the floor (only go as far as is comfortable). Then slowly slide your foot back down until your leg is flat on the floor again. Angel Wings: Lying on your back spread your legs to the side as far apart as you can without causing discomfort.  A rehabilitation program following serious knee injuries can speed recovery and prevent re-injury in the future due to weakened muscles. Contact your doctor or a physical therapist for more information on knee rehabilitation.   IF YOU ARE TRANSFERRED TO A SKILLED REHAB FACILITY If the patient is transferred to a skilled rehab facility following release from the hospital, a list of the current medications will be sent to the facility for the patient to continue.  When discharged from the skilled rehab facility, please have the facility set up the patient's [place] prior to being released. Also, the skilled facility will be responsible for providing the patient with their medications at time of release from the facility to include their pain medication, the muscle relaxants, and their blood thinner medication. If the patient is still at the rehab facility at time of the two week follow up appointment, the skilled rehab facility will also need to assist the patient in  arranging follow up appointment in our office and any transportation needs.  MAKE SURE YOU:  Understand these instructions.  Get help right away if you are not doing well or get worse.    Pick up stool softner and laxative for home use following surgery while on pain medications. Do not submerge incision under water. Please use good hand washing techniques while changing dressing each day. May shower starting three days after surgery. Please use a clean towel to pat the incision dry following showers. Continue to use ice for pain and swelling after surgery. Do not use any lotions or creams on the incision until instructed by your surgeon.   Increase activity slowly as tolerated   Complete by: As directed      Allergies as of 07/28/2019      Reactions   Sulfonamide Derivatives Rash   Childhood reaction      Medication List    STOP taking these medications   meloxicam 7.5 MG tablet Commonly known as: MOBIC     TAKE these medications   aspirin 325 MG EC tablet Take 1 tablet (325 mg total) by mouth 2 (two) times daily.   CALCIUM CARBONATE-VITAMIN D3 PO Take 1 tablet by mouth daily.   carvedilol 12.5 MG tablet Commonly known as: COREG TAKE 1 TABLET (12.5 MG TOTAL) BY MOUTH 2 (TWO) TIMES DAILY WITH A MEAL.   cholecalciferol 1000 units tablet Commonly known as: VITAMIN D Take 1,000 Units by mouth daily.   ferrous sulfate 325 (65 FE) MG tablet Take 1 tablet (325 mg total) by mouth 2 (two) times daily with a meal. What changed: when to take this   gabapentin 100 MG capsule Commonly known as: NEURONTIN Take 1 capsule (100 mg total) by mouth 3 (three) times daily. 1 tab 3x/day for 2 weeks. 1 tab 2x/day for 2 weeks. 1 tab a day for 2 weeks  methocarbamol 500 MG tablet Commonly known as: ROBAXIN Take 1 tablet (500 mg total) by mouth every 6 (six) hours as needed for muscle spasms.   multivitamin with minerals Tabs tablet Take 1 tablet by mouth daily.   OVER THE COUNTER  MEDICATION Apply 1 application topically daily as needed (knee pain). hemp freeze gel   oxyCODONE 5 MG immediate release tablet Commonly known as: Oxy IR/ROXICODONE Take 1-2 tablets (5-10 mg total) by mouth every 4 (four) hours as needed for severe pain.   pantoprazole 40 MG tablet Commonly known as: PROTONIX Take 1 tablet (40 mg total) by mouth daily.   SOOTHE XP OP Place 1 drop into both eyes 2 (two) times daily.   torsemide 20 MG tablet Commonly known as: Demadex Take 1 tablet (20 mg total) by mouth daily.   traMADol 50 MG tablet Commonly known as: ULTRAM Take 1-2 tablets (50-100 mg total) by mouth every 12 (twelve) hours as needed for moderate pain.   vitamin C 500 MG tablet Commonly known as: ASCORBIC ACID Take 500 mg by mouth daily.   zinc gluconate 50 MG tablet Take 1 tablet (50 mg total) daily by mouth.      Follow-up Information    Gaynelle Arabian, MD. Schedule an appointment as soon as possible for a visit on 08/10/2019.   Specialty: Orthopedic Surgery Contact information: [address] [place] [address] [id number]           Signed: Ardeen Jourdain, PA-C Orthopaedic Surgery 07/28/2019, 12:53 PM

## 2019-07-28 NOTE — Plan of Care (Signed)

## 2019-07-28 NOTE — Progress Notes (Signed)
Physical Therapy Treatment Patient Details Name: Claudia Lara MRN: [id number] DOB: 18-Mar-1932 Today's Date: 07/28/2019    History of Present Illness 85 yo female s/p L TKA; PMH: R TKA, HTN, PVD, R posterior THA    PT Comments    Pt progressing well; ready for d/c home from PT standpoint   Follow Up Recommendations  Follow surgeon's recommendation for DC plan and follow-up therapies     Equipment Recommendations  None recommended by PT    Recommendations for Other Services       Precautions / Restrictions Precautions Precautions: Fall;Knee Required Braces or Orthoses: Knee Immobilizer - Left Knee Immobilizer - Left: Discontinue once straight leg raise with < 10 degree lag Restrictions Weight Bearing Restrictions: No Other Position/Activity Restrictions: WBAT    Mobility  Bed Mobility Overal bed mobility: Needs Assistance Bed Mobility: Supine to Sit     Supine to sit: Supervision Sit to supine: Supervision   General bed mobility comments: for safety  Transfers Overall transfer level: Needs assistance Equipment used: Rolling walker (2 wheeled) Transfers: Sit to/from Stand Sit to Stand: Min guard;Supervision         General transfer comment: cues for hand placement and LLE positioning  Ambulation/Gait Ambulation/Gait assistance: Min guard;Supervision Gait Distance (Feet): 120 Feet Assistive device: Rolling walker (2 wheeled) Gait Pattern/deviations: Step-to pattern;Decreased weight shift to left     General Gait Details: cues for sequence and RW position, improving wt shift, beginning step through gait    Stairs             Wheelchair Mobility    Modified Rankin (Stroke Patients Only)       Balance                                            Cognition Arousal/Alertness: Awake/alert Behavior During Therapy: WFL for tasks assessed/performed Overall Cognitive Status: Within Functional Limits for tasks assessed                                         Exercises Total Joint Exercises Ankle Circles/Pumps: AROM;10 reps;Both Quad Sets: AROM;Both;10 reps Short Arc Quad: AROM;Left;Strengthening;10 reps Heel Slides: AAROM;Left;10 reps Hip ABduction/ADduction: AROM;AAROM;Left;10 reps Straight Leg Raises: AROM;AAROM;Left;10 reps Goniometric ROM: 6 to 65 degrees flexion    General Comments        Pertinent Vitals/Pain Pain Assessment: 0-10 Pain Score: 0-No pain    Home Living Family/patient expects to be discharged to:: Private residence Living Arrangements: Alone                  Prior Function            PT Goals (current goals can now be found in the care plan section) Acute Rehab PT Goals PT Goal Formulation: With patient Time For Goal Achievement: 08/02/19 Potential to Achieve Goals: Good Progress towards PT goals: Progressing toward goals    Frequency    7X/week      PT Plan Current plan remains appropriate    Co-evaluation              AM-PAC PT "6 Clicks" Mobility   Outcome Measure  Help needed turning from your back to your side while in a flat bed without using bedrails?: None Help needed moving from lying  on your back to sitting on the side of a flat bed without using bedrails?: None Help needed moving to and from a bed to a chair (including a wheelchair)?: A Little Help needed standing up from a chair using your arms (e.g., wheelchair or bedside chair)?: A Little Help needed to walk in hospital room?: A Little Help needed climbing 3-5 steps with a railing? : A Little 6 Click Score: 20    End of Session Equipment Utilized During Treatment: Gait belt Activity Tolerance: Patient tolerated treatment well Patient left: with call bell/phone within reach;in bed;with bed alarm set   PT Visit Diagnosis: Difficulty in walking, not elsewhere classified (R26.2)     Time: [id number] PT Time Calculation (min) (ACUTE ONLY): 19 min  Charges:  $Gait  Training: 8-22 mins                     Kenyon Ana, PT  Pager: [phone] Acute Rehab Dept [facility]): [phone]   07/28/2019    [facility] 07/28/2019, 11:08 AM

## 2019-07-29 ENCOUNTER — Telehealth: Payer: Self-pay | Admitting: *Deleted

## 2019-07-29 DIAGNOSIS — Z9181 History of falling: Secondary | ICD-10-CM | POA: Diagnosis not present

## 2019-07-29 DIAGNOSIS — Z7982 Long term (current) use of aspirin: Secondary | ICD-10-CM | POA: Diagnosis not present

## 2019-07-29 DIAGNOSIS — Z96653 Presence of artificial knee joint, bilateral: Secondary | ICD-10-CM | POA: Diagnosis not present

## 2019-07-29 DIAGNOSIS — I129 Hypertensive chronic kidney disease with stage 1 through stage 4 chronic kidney disease, or unspecified chronic kidney disease: Secondary | ICD-10-CM | POA: Diagnosis not present

## 2019-07-29 DIAGNOSIS — R911 Solitary pulmonary nodule: Secondary | ICD-10-CM | POA: Diagnosis not present

## 2019-07-29 DIAGNOSIS — E785 Hyperlipidemia, unspecified: Secondary | ICD-10-CM | POA: Diagnosis not present

## 2019-07-29 DIAGNOSIS — N183 Chronic kidney disease, stage 3 (moderate): Secondary | ICD-10-CM | POA: Diagnosis not present

## 2019-07-29 DIAGNOSIS — Z471 Aftercare following joint replacement surgery: Secondary | ICD-10-CM | POA: Diagnosis not present

## 2019-07-29 DIAGNOSIS — M8580 Other specified disorders of bone density and structure, unspecified site: Secondary | ICD-10-CM | POA: Diagnosis not present

## 2019-07-29 DIAGNOSIS — I6529 Occlusion and stenosis of unspecified carotid artery: Secondary | ICD-10-CM | POA: Diagnosis not present

## 2019-07-29 DIAGNOSIS — I739 Peripheral vascular disease, unspecified: Secondary | ICD-10-CM | POA: Diagnosis not present

## 2019-07-29 DIAGNOSIS — Z96641 Presence of right artificial hip joint: Secondary | ICD-10-CM | POA: Diagnosis not present

## 2019-07-29 DIAGNOSIS — M4716 Other spondylosis with myelopathy, lumbar region: Secondary | ICD-10-CM | POA: Diagnosis not present

## 2019-07-29 DIAGNOSIS — I872 Venous insufficiency (chronic) (peripheral): Secondary | ICD-10-CM | POA: Diagnosis not present

## 2019-07-29 DIAGNOSIS — K219 Gastro-esophageal reflux disease without esophagitis: Secondary | ICD-10-CM | POA: Diagnosis not present

## 2019-07-29 DIAGNOSIS — I350 Nonrheumatic aortic (valve) stenosis: Secondary | ICD-10-CM | POA: Diagnosis not present

## 2019-07-29 NOTE — Telephone Encounter (Signed)
Pt was on TCM report admitted 07/26/19 for Primary osteoarthritis left knee. Pt under went a TOTAL (L) KNEE ARTHROPLASTY. Pt tolerated procedure well and will follow=up w/surgeon in 2 weeks.Marland KitchenJohny Chess

## 2019-07-30 ENCOUNTER — Ambulatory Visit: Payer: Medicare Other | Admitting: Physical Therapy

## 2019-07-30 DIAGNOSIS — Z96653 Presence of artificial knee joint, bilateral: Secondary | ICD-10-CM | POA: Diagnosis not present

## 2019-07-30 DIAGNOSIS — R911 Solitary pulmonary nodule: Secondary | ICD-10-CM | POA: Diagnosis not present

## 2019-07-30 DIAGNOSIS — I739 Peripheral vascular disease, unspecified: Secondary | ICD-10-CM | POA: Diagnosis not present

## 2019-07-30 DIAGNOSIS — I129 Hypertensive chronic kidney disease with stage 1 through stage 4 chronic kidney disease, or unspecified chronic kidney disease: Secondary | ICD-10-CM | POA: Diagnosis not present

## 2019-07-30 DIAGNOSIS — I350 Nonrheumatic aortic (valve) stenosis: Secondary | ICD-10-CM | POA: Diagnosis not present

## 2019-07-30 DIAGNOSIS — M4716 Other spondylosis with myelopathy, lumbar region: Secondary | ICD-10-CM | POA: Diagnosis not present

## 2019-07-30 DIAGNOSIS — K219 Gastro-esophageal reflux disease without esophagitis: Secondary | ICD-10-CM | POA: Diagnosis not present

## 2019-07-30 DIAGNOSIS — Z96641 Presence of right artificial hip joint: Secondary | ICD-10-CM | POA: Diagnosis not present

## 2019-07-30 DIAGNOSIS — Z9181 History of falling: Secondary | ICD-10-CM | POA: Diagnosis not present

## 2019-07-30 DIAGNOSIS — I872 Venous insufficiency (chronic) (peripheral): Secondary | ICD-10-CM | POA: Diagnosis not present

## 2019-07-30 DIAGNOSIS — I6529 Occlusion and stenosis of unspecified carotid artery: Secondary | ICD-10-CM | POA: Diagnosis not present

## 2019-07-30 DIAGNOSIS — Z471 Aftercare following joint replacement surgery: Secondary | ICD-10-CM | POA: Diagnosis not present

## 2019-07-30 DIAGNOSIS — Z7982 Long term (current) use of aspirin: Secondary | ICD-10-CM | POA: Diagnosis not present

## 2019-07-30 DIAGNOSIS — N183 Chronic kidney disease, stage 3 (moderate): Secondary | ICD-10-CM | POA: Diagnosis not present

## 2019-07-30 DIAGNOSIS — M8580 Other specified disorders of bone density and structure, unspecified site: Secondary | ICD-10-CM | POA: Diagnosis not present

## 2019-07-30 DIAGNOSIS — E785 Hyperlipidemia, unspecified: Secondary | ICD-10-CM | POA: Diagnosis not present

## 2019-08-02 DIAGNOSIS — Z471 Aftercare following joint replacement surgery: Secondary | ICD-10-CM | POA: Diagnosis not present

## 2019-08-02 DIAGNOSIS — Z96653 Presence of artificial knee joint, bilateral: Secondary | ICD-10-CM | POA: Diagnosis not present

## 2019-08-02 DIAGNOSIS — M4716 Other spondylosis with myelopathy, lumbar region: Secondary | ICD-10-CM | POA: Diagnosis not present

## 2019-08-02 DIAGNOSIS — E785 Hyperlipidemia, unspecified: Secondary | ICD-10-CM | POA: Diagnosis not present

## 2019-08-02 DIAGNOSIS — R911 Solitary pulmonary nodule: Secondary | ICD-10-CM | POA: Diagnosis not present

## 2019-08-02 DIAGNOSIS — N183 Chronic kidney disease, stage 3 (moderate): Secondary | ICD-10-CM | POA: Diagnosis not present

## 2019-08-02 DIAGNOSIS — Z96641 Presence of right artificial hip joint: Secondary | ICD-10-CM | POA: Diagnosis not present

## 2019-08-02 DIAGNOSIS — I129 Hypertensive chronic kidney disease with stage 1 through stage 4 chronic kidney disease, or unspecified chronic kidney disease: Secondary | ICD-10-CM | POA: Diagnosis not present

## 2019-08-02 DIAGNOSIS — I350 Nonrheumatic aortic (valve) stenosis: Secondary | ICD-10-CM | POA: Diagnosis not present

## 2019-08-02 DIAGNOSIS — I6529 Occlusion and stenosis of unspecified carotid artery: Secondary | ICD-10-CM | POA: Diagnosis not present

## 2019-08-02 DIAGNOSIS — Z9181 History of falling: Secondary | ICD-10-CM | POA: Diagnosis not present

## 2019-08-02 DIAGNOSIS — M8580 Other specified disorders of bone density and structure, unspecified site: Secondary | ICD-10-CM | POA: Diagnosis not present

## 2019-08-02 DIAGNOSIS — I872 Venous insufficiency (chronic) (peripheral): Secondary | ICD-10-CM | POA: Diagnosis not present

## 2019-08-02 DIAGNOSIS — I739 Peripheral vascular disease, unspecified: Secondary | ICD-10-CM | POA: Diagnosis not present

## 2019-08-02 DIAGNOSIS — K219 Gastro-esophageal reflux disease without esophagitis: Secondary | ICD-10-CM | POA: Diagnosis not present

## 2019-08-02 DIAGNOSIS — Z7982 Long term (current) use of aspirin: Secondary | ICD-10-CM | POA: Diagnosis not present

## 2019-08-03 ENCOUNTER — Ambulatory Visit: Payer: Medicare Other | Admitting: Physical Therapy

## 2019-08-04 ENCOUNTER — Ambulatory Visit (INDEPENDENT_AMBULATORY_CARE_PROVIDER_SITE_OTHER): Payer: Medicare Other | Admitting: Internal Medicine

## 2019-08-04 ENCOUNTER — Other Ambulatory Visit: Payer: Self-pay

## 2019-08-04 ENCOUNTER — Other Ambulatory Visit (INDEPENDENT_AMBULATORY_CARE_PROVIDER_SITE_OTHER): Payer: Medicare Other

## 2019-08-04 ENCOUNTER — Encounter: Payer: Self-pay | Admitting: Internal Medicine

## 2019-08-04 ENCOUNTER — Inpatient Hospital Stay (HOSPITAL_COMMUNITY)
Admission: EM | Admit: 2019-08-04 | Discharge: 2019-08-06 | DRG: 378 | Disposition: A | Discharge status: Home Health | Payer: Medicare Other | Attending: Internal Medicine | Admitting: Internal Medicine

## 2019-08-04 ENCOUNTER — Encounter (HOSPITAL_COMMUNITY): Payer: Self-pay

## 2019-08-04 ENCOUNTER — Emergency Department (HOSPITAL_BASED_OUTPATIENT_CLINIC_OR_DEPARTMENT_OTHER): Payer: Medicare Other

## 2019-08-04 VITALS — BP 120/10 | HR 97 | Temp 97.9°F | Resp 16 | Ht 63.0 in | Wt 171.0 lb

## 2019-08-04 DIAGNOSIS — Z86010 Personal history of colonic polyps: Secondary | ICD-10-CM

## 2019-08-04 DIAGNOSIS — D599 Acquired hemolytic anemia, unspecified: Secondary | ICD-10-CM

## 2019-08-04 DIAGNOSIS — M79609 Pain in unspecified limb: Secondary | ICD-10-CM | POA: Diagnosis not present

## 2019-08-04 DIAGNOSIS — N183 Chronic kidney disease, stage 3 unspecified: Secondary | ICD-10-CM

## 2019-08-04 DIAGNOSIS — K2950 Unspecified chronic gastritis without bleeding: Secondary | ICD-10-CM | POA: Diagnosis not present

## 2019-08-04 DIAGNOSIS — Z96641 Presence of right artificial hip joint: Secondary | ICD-10-CM | POA: Diagnosis present

## 2019-08-04 DIAGNOSIS — I82492 Acute embolism and thrombosis of other specified deep vein of left lower extremity: Secondary | ICD-10-CM | POA: Diagnosis not present

## 2019-08-04 DIAGNOSIS — Z79891 Long term (current) use of opiate analgesic: Secondary | ICD-10-CM | POA: Diagnosis not present

## 2019-08-04 DIAGNOSIS — R911 Solitary pulmonary nodule: Secondary | ICD-10-CM | POA: Diagnosis not present

## 2019-08-04 DIAGNOSIS — K219 Gastro-esophageal reflux disease without esophagitis: Secondary | ICD-10-CM | POA: Diagnosis not present

## 2019-08-04 DIAGNOSIS — Z7982 Long term (current) use of aspirin: Secondary | ICD-10-CM

## 2019-08-04 DIAGNOSIS — Z23 Encounter for immunization: Secondary | ICD-10-CM | POA: Diagnosis not present

## 2019-08-04 DIAGNOSIS — Z9049 Acquired absence of other specified parts of digestive tract: Secondary | ICD-10-CM

## 2019-08-04 DIAGNOSIS — D649 Anemia, unspecified: Secondary | ICD-10-CM

## 2019-08-04 DIAGNOSIS — E861 Hypovolemia: Secondary | ICD-10-CM | POA: Diagnosis not present

## 2019-08-04 DIAGNOSIS — Z90710 Acquired absence of both cervix and uterus: Secondary | ICD-10-CM

## 2019-08-04 DIAGNOSIS — K317 Polyp of stomach and duodenum: Secondary | ICD-10-CM

## 2019-08-04 DIAGNOSIS — Z882 Allergy status to sulfonamides status: Secondary | ICD-10-CM | POA: Diagnosis not present

## 2019-08-04 DIAGNOSIS — Z96653 Presence of artificial knee joint, bilateral: Secondary | ICD-10-CM | POA: Diagnosis present

## 2019-08-04 DIAGNOSIS — E669 Obesity, unspecified: Secondary | ICD-10-CM | POA: Diagnosis present

## 2019-08-04 DIAGNOSIS — Z806 Family history of leukemia: Secondary | ICD-10-CM

## 2019-08-04 DIAGNOSIS — D62 Acute posthemorrhagic anemia: Secondary | ICD-10-CM | POA: Diagnosis not present

## 2019-08-04 DIAGNOSIS — M7989 Other specified soft tissue disorders: Secondary | ICD-10-CM | POA: Diagnosis not present

## 2019-08-04 DIAGNOSIS — I5032 Chronic diastolic (congestive) heart failure: Secondary | ICD-10-CM | POA: Diagnosis not present

## 2019-08-04 DIAGNOSIS — R600 Localized edema: Secondary | ICD-10-CM

## 2019-08-04 DIAGNOSIS — Z801 Family history of malignant neoplasm of trachea, bronchus and lung: Secondary | ICD-10-CM | POA: Diagnosis not present

## 2019-08-04 DIAGNOSIS — I82462 Acute embolism and thrombosis of left calf muscular vein: Secondary | ICD-10-CM

## 2019-08-04 DIAGNOSIS — K254 Chronic or unspecified gastric ulcer with hemorrhage: Principal | ICD-10-CM | POA: Diagnosis present

## 2019-08-04 DIAGNOSIS — I6529 Occlusion and stenosis of unspecified carotid artery: Secondary | ICD-10-CM | POA: Diagnosis not present

## 2019-08-04 DIAGNOSIS — Z6830 Body mass index (BMI) 30.0-30.9, adult: Secondary | ICD-10-CM

## 2019-08-04 DIAGNOSIS — K449 Diaphragmatic hernia without obstruction or gangrene: Secondary | ICD-10-CM | POA: Diagnosis present

## 2019-08-04 DIAGNOSIS — D539 Nutritional anemia, unspecified: Secondary | ICD-10-CM | POA: Insufficient documentation

## 2019-08-04 DIAGNOSIS — I9589 Other hypotension: Secondary | ICD-10-CM | POA: Diagnosis not present

## 2019-08-04 DIAGNOSIS — Z79899 Other long term (current) drug therapy: Secondary | ICD-10-CM

## 2019-08-04 DIAGNOSIS — D631 Anemia in chronic kidney disease: Secondary | ICD-10-CM | POA: Diagnosis present

## 2019-08-04 DIAGNOSIS — I352 Nonrheumatic aortic (valve) stenosis with insufficiency: Secondary | ICD-10-CM | POA: Diagnosis present

## 2019-08-04 DIAGNOSIS — K253 Acute gastric ulcer without hemorrhage or perforation: Secondary | ICD-10-CM

## 2019-08-04 DIAGNOSIS — I130 Hypertensive heart and chronic kidney disease with heart failure and stage 1 through stage 4 chronic kidney disease, or unspecified chronic kidney disease: Secondary | ICD-10-CM | POA: Diagnosis not present

## 2019-08-04 DIAGNOSIS — K921 Melena: Secondary | ICD-10-CM

## 2019-08-04 DIAGNOSIS — Z8041 Family history of malignant neoplasm of ovary: Secondary | ICD-10-CM

## 2019-08-04 DIAGNOSIS — Z86718 Personal history of other venous thrombosis and embolism: Secondary | ICD-10-CM

## 2019-08-04 DIAGNOSIS — Z20828 Contact with and (suspected) exposure to other viral communicable diseases: Secondary | ICD-10-CM | POA: Diagnosis present

## 2019-08-04 LAB — CBC WITH DIFFERENTIAL/PLATELET
Abs Immature Granulocytes: 0.25 10*3/uL — ABNORMAL HIGH (ref 0.00–0.07)
Basophils Absolute: 0.1 10*3/uL (ref 0.0–0.1)
Basophils Absolute: 0 10*3/uL (ref 0.0–0.1)
Basophils Relative: 0.7 % (ref 0.0–3.0)
Basophils Relative: 0 %
Eosinophils Absolute: 0.3 10*3/uL (ref 0.0–0.7)
Eosinophils Absolute: 0.3 10*3/uL (ref 0.0–0.5)
Eosinophils Relative: 3.3 % (ref 0.0–5.0)
Eosinophils Relative: 3 %
HCT: 20.9 % — CL (ref 36.0–46.0)
HCT: 21.9 % — ABNORMAL LOW (ref 36.0–46.0)
Hemoglobin: 7.2 g/dL — CL (ref 12.0–15.0)
Hemoglobin: 6.9 g/dL — CL (ref 12.0–15.0)
Immature Granulocytes: 3 %
Lymphocytes Relative: 12.3 % (ref 12.0–46.0)
Lymphocytes Relative: 14 %
Lymphs Abs: 1.2 10*3/uL (ref 0.7–4.0)
Lymphs Abs: 1.3 10*3/uL (ref 0.7–4.0)
MCH: 30.8 pg (ref 26.0–34.0)
MCHC: 34.6 g/dL (ref 30.0–36.0)
MCHC: 31.5 g/dL (ref 30.0–36.0)
MCV: 91.8 fl (ref 78.0–100.0)
MCV: 97.8 fL (ref 80.0–100.0)
Monocytes Absolute: 0.8 10*3/uL (ref 0.1–1.0)
Monocytes Absolute: 0.9 10*3/uL (ref 0.1–1.0)
Monocytes Relative: 8.2 % (ref 3.0–12.0)
Monocytes Relative: 10 %
Neutro Abs: 7.4 10*3/uL (ref 1.4–7.7)
Neutro Abs: 6.5 10*3/uL (ref 1.7–7.7)
Neutrophils Relative %: 75.5 % (ref 43.0–77.0)
Neutrophils Relative %: 70 %
Platelets: 291 10*3/uL (ref 150.0–400.0)
Platelets: 273 10*3/uL (ref 150–400)
RBC: 2.27 Mil/uL — ABNORMAL LOW (ref 3.87–5.11)
RBC: 2.24 MIL/uL — ABNORMAL LOW (ref 3.87–5.11)
RDW: 20.1 % — ABNORMAL HIGH (ref 11.5–15.5)
RDW: 20.6 % — ABNORMAL HIGH (ref 11.5–15.5)
WBC: 9.8 10*3/uL (ref 4.0–10.5)
WBC: 9.3 10*3/uL (ref 4.0–10.5)
nRBC: 0 % (ref 0.0–0.2)

## 2019-08-04 LAB — VITAMIN B12: Vitamin B-12: 349 pg/mL (ref 211–911)

## 2019-08-04 LAB — URINALYSIS, ROUTINE W REFLEX MICROSCOPIC
Bilirubin Urine: NEGATIVE
Glucose, UA: NEGATIVE mg/dL
Hgb urine dipstick: NEGATIVE
Ketones, ur: NEGATIVE mg/dL
Nitrite: NEGATIVE
Protein, ur: NEGATIVE mg/dL
Specific Gravity, Urine: 1.015 (ref 1.005–1.030)
pH: 7 (ref 5.0–8.0)

## 2019-08-04 LAB — BASIC METABOLIC PANEL
BUN: 25 mg/dL — ABNORMAL HIGH (ref 6–23)
CO2: 26 mEq/L (ref 19–32)
Calcium: 8.8 mg/dL (ref 8.4–10.5)
Chloride: 106 mEq/L (ref 96–112)
Creatinine, Ser: 1.51 mg/dL — ABNORMAL HIGH (ref 0.40–1.20)
GFR: 32.72 mL/min — ABNORMAL LOW (ref >60.00)
Glucose, Bld: 140 mg/dL — ABNORMAL HIGH (ref 70–99)
Potassium: 3.8 mEq/L (ref 3.5–5.1)
Sodium: 141 mEq/L (ref 135–145)

## 2019-08-04 LAB — COMPREHENSIVE METABOLIC PANEL
ALT: 13 U/L (ref 0–44)
AST: 15 U/L (ref 15–41)
Albumin: 3.9 g/dL (ref 3.5–5.0)
Alkaline Phosphatase: 66 U/L (ref 38–126)
Anion gap: 11 (ref 5–15)
BUN: 25 mg/dL — ABNORMAL HIGH (ref 8–23)
CO2: 25 mmol/L (ref 22–32)
Calcium: 8.9 mg/dL (ref 8.9–10.3)
Chloride: 105 mmol/L (ref 98–111)
Creatinine, Ser: 1.5 mg/dL — ABNORMAL HIGH (ref 0.44–1.00)
GFR calc Af Amer: 36 mL/min — ABNORMAL LOW (ref >60)
GFR calc non Af Amer: 31 mL/min — ABNORMAL LOW (ref >60)
Glucose, Bld: 100 mg/dL — ABNORMAL HIGH (ref 70–99)
Potassium: 3.8 mmol/L (ref 3.5–5.1)
Sodium: 141 mmol/L (ref 135–145)
Total Bilirubin: 2 mg/dL — ABNORMAL HIGH (ref 0.3–1.2)
Total Protein: 6.4 g/dL — ABNORMAL LOW (ref 6.5–8.1)

## 2019-08-04 LAB — HEPATIC FUNCTION PANEL
ALT: 9 U/L (ref 0–35)
AST: 12 U/L (ref 0–37)
Albumin: 3.8 g/dL (ref 3.5–5.2)
Alkaline Phosphatase: 65 U/L (ref 39–117)
Bilirubin, Direct: 0.4 mg/dL — ABNORMAL HIGH (ref 0.0–0.3)
Total Bilirubin: 2 mg/dL — ABNORMAL HIGH (ref 0.2–1.2)
Total Protein: 6.1 g/dL (ref 6.0–8.3)

## 2019-08-04 LAB — HEMOGLOBIN AND HEMATOCRIT, BLOOD
HCT: 24.1 % — ABNORMAL LOW (ref 36.0–46.0)
Hemoglobin: 7.6 g/dL — ABNORMAL LOW (ref 12.0–15.0)

## 2019-08-04 LAB — FERRITIN: Ferritin: 484.4 ng/mL — ABNORMAL HIGH (ref 10.0–291.0)

## 2019-08-04 LAB — SARS CORONAVIRUS 2 BY RT PCR (HOSPITAL ORDER, PERFORMED IN ~~LOC~~ HOSPITAL LAB): SARS Coronavirus 2: NEGATIVE

## 2019-08-04 LAB — IBC PANEL
Iron: 78 ug/dL (ref 42–145)
Saturation Ratios: 37.1 % (ref 20.0–50.0)
Transferrin: 150 mg/dL — ABNORMAL LOW (ref 212.0–360.0)

## 2019-08-04 LAB — PREPARE RBC (CROSSMATCH)

## 2019-08-04 LAB — BRAIN NATRIURETIC PEPTIDE: Pro B Natriuretic peptide (BNP): 305 pg/mL — ABNORMAL HIGH (ref 0.0–100.0)

## 2019-08-04 LAB — FOLATE: Folate: >24.7 ng/mL (ref >5.9)

## 2019-08-04 LAB — TSH: TSH: 1.12 u[IU]/mL (ref 0.35–4.50)

## 2019-08-04 MED ORDER — CARVEDILOL 12.5 MG PO TABS
12.5000 mg | ORAL_TABLET | Freq: Two times a day (BID) | ORAL | Status: DC
  Administered 2019-08-05 (×2): 12.5 mg via ORAL
  Filled 2019-08-04 (×3): qty 1

## 2019-08-04 MED ORDER — SODIUM CHLORIDE 0.9 % IV SOLN: 10.0000 mL/h | Freq: Once | INTRAVENOUS | Status: DC

## 2019-08-04 MED ORDER — VITAMIN C 500 MG PO TABS
500.0000 mg | ORAL_TABLET | Freq: Every day | ORAL | Status: DC
  Administered 2019-08-05 – 2019-08-06 (×2): 500 mg via ORAL
  Filled 2019-08-04 (×2): qty 1

## 2019-08-04 MED ORDER — METHOCARBAMOL 500 MG PO TABS: 500.0000 mg | ORAL_TABLET | Freq: Three times a day (TID) | ORAL | Status: DC | PRN

## 2019-08-04 MED ORDER — ZINC SULFATE 220 (50 ZN) MG PO CAPS
220.0000 mg | ORAL_CAPSULE | Freq: Every day | ORAL | Status: DC
  Administered 2019-08-05 – 2019-08-06 (×2): 220 mg via ORAL
  Filled 2019-08-04 (×2): qty 1

## 2019-08-04 MED ORDER — OXYCODONE HCL 5 MG PO TABS: 5.0000 mg | ORAL_TABLET | ORAL | Status: DC | PRN

## 2019-08-04 MED ORDER — ADULT MULTIVITAMIN W/MINERALS CH
1.0000 | ORAL_TABLET | Freq: Every day | ORAL | Status: DC
  Administered 2019-08-05 – 2019-08-06 (×2): 1 via ORAL
  Filled 2019-08-04 (×2): qty 1

## 2019-08-04 MED ORDER — VITAMIN D 25 MCG (1000 UNIT) PO TABS
1000.0000 [IU] | ORAL_TABLET | Freq: Every day | ORAL | Status: DC
  Administered 2019-08-05 – 2019-08-06 (×2): 1000 [IU] via ORAL
  Filled 2019-08-04 (×2): qty 1

## 2019-08-04 MED ORDER — TRAMADOL HCL 50 MG PO TABS: 50.0000 mg | ORAL_TABLET | Freq: Two times a day (BID) | ORAL | Status: DC | PRN

## 2019-08-04 MED ORDER — FERROUS SULFATE 325 (65 FE) MG PO TABS
325.0000 mg | ORAL_TABLET | Freq: Two times a day (BID) | ORAL | Status: DC
  Administered 2019-08-05 (×2): 325 mg via ORAL
  Filled 2019-08-04 (×3): qty 1

## 2019-08-04 MED ORDER — TORSEMIDE 20 MG PO TABS
20.0000 mg | ORAL_TABLET | Freq: Every day | ORAL | Status: DC
  Administered 2019-08-05: 20 mg via ORAL
  Filled 2019-08-04 (×2): qty 1

## 2019-08-04 MED ORDER — CALCIUM CARBONATE-VITAMIN D 500-200 MG-UNIT PO TABS
1.0000 | ORAL_TABLET | Freq: Every day | ORAL | Status: DC
  Administered 2019-08-04 – 2019-08-06 (×3): 1 via ORAL
  Filled 2019-08-04 (×3): qty 1

## 2019-08-04 MED ORDER — POLYVINYL ALCOHOL 1.4 % OP SOLN
1.0000 [drp] | Freq: Two times a day (BID) | OPHTHALMIC | Status: DC
  Administered 2019-08-04 – 2019-08-06 (×4): 1 [drp] via OPHTHALMIC
  Filled 2019-08-04: qty 15

## 2019-08-04 MED ORDER — GABAPENTIN 100 MG PO CAPS
100.0000 mg | ORAL_CAPSULE | Freq: Three times a day (TID) | ORAL | Status: DC
  Administered 2019-08-04 – 2019-08-06 (×5): 100 mg via ORAL
  Filled 2019-08-04 (×5): qty 1

## 2019-08-04 MED ORDER — PANTOPRAZOLE SODIUM 40 MG PO TBEC
40.0000 mg | DELAYED_RELEASE_TABLET | Freq: Every day | ORAL | Status: DC
  Administered 2019-08-04 – 2019-08-05 (×2): 40 mg via ORAL
  Filled 2019-08-04 (×2): qty 1

## 2019-08-04 NOTE — Progress Notes (Signed)
Left lower extremity venous duplex completed. Refer to "CV Proc" under chart review to view preliminary results.  Critical results discussed with Dr. Lita Mains and Fara Chute, RN.  08/04/2019 3:33 PM Maudry Mayhew, MHA, RVT, RDCS, RDMS

## 2019-08-04 NOTE — ED Triage Notes (Signed)
Pt presents w/ Hgb 7.2 and slightly elevated kidney levels.  Pt reports she recently had a total knee replacement and she typically needs transfusions after surgeries.  Hx of anemia. C/o weakness.

## 2019-08-04 NOTE — ED Notes (Signed)
Date and time results received: 08/04/19 3:04 PM   Test:Hgb Critical Value: 6.9  Name of Provider Notified: Primary RN  Orders Received? Or Actions Taken?: Awaiting further orders

## 2019-08-04 NOTE — Patient Instructions (Signed)
Anemia  Anemia is a condition in which you do not have enough red blood cells or hemoglobin. Hemoglobin is a substance in red blood cells that carries oxygen. When you do not have enough red blood cells or hemoglobin (are anemic), your body cannot get enough oxygen and your organs may not work properly. As a result, you may feel very tired or have other problems. What are the causes? Common causes of anemia include:  Excessive bleeding. Anemia can be caused by excessive bleeding inside or outside the body, including bleeding from the intestine or from periods in women.  Poor nutrition.  Long-lasting (chronic) kidney, thyroid, and liver disease.  Bone marrow disorders.  Cancer and treatments for cancer.  HIV (human immunodeficiency virus) and AIDS (acquired immunodeficiency syndrome).  Treatments for HIV and AIDS.  Spleen problems.  Blood disorders.  Infections, medicines, and autoimmune disorders that destroy red blood cells. What are the signs or symptoms? Symptoms of this condition include:  Minor weakness.  Dizziness.  Headache.  Feeling heartbeats that are irregular or faster than normal (palpitations).  Shortness of breath, especially with exercise.  Paleness.  Cold sensitivity.  Indigestion.  Nausea.  Difficulty sleeping.  Difficulty concentrating. Symptoms may occur suddenly or develop slowly. If your anemia is mild, you may not have symptoms. How is this diagnosed? This condition is diagnosed based on:  Blood tests.  Your medical history.  A physical exam.  Bone marrow biopsy. Your health care provider may also check your stool (feces) for blood and may do additional testing to look for the cause of your bleeding. You may also have other tests, including:  Imaging tests, such as a CT scan or MRI.  Endoscopy.  Colonoscopy. How is this treated? Treatment for this condition depends on the cause. If you continue to lose a lot of blood, you may  need to be treated at a hospital. Treatment may include:  Taking supplements of iron, vitamin S31, or folic acid.  Taking a hormone medicine (erythropoietin) that can help to stimulate red blood cell growth.  Having a blood transfusion. This may be needed if you lose a lot of blood.  Making changes to your diet.  Having surgery to remove your spleen. Follow these instructions at home:  Take over-the-counter and prescription medicines only as told by your health care provider.  Take supplements only as told by your health care provider.  Follow any diet instructions that you were given.  Keep all follow-up visits as told by your health care provider. This is important. Contact a health care provider if:  You develop new bleeding anywhere in the body. Get help right away if:  You are very weak.  You are short of breath.  You have pain in your abdomen or chest.  You are dizzy or feel faint.  You have trouble concentrating.  You have bloody or black, tarry stools.  You vomit repeatedly or you vomit up blood. Summary  Anemia is a condition in which you do not have enough red blood cells or enough of a substance in your red blood cells that carries oxygen (hemoglobin).  Symptoms may occur suddenly or develop slowly.  If your anemia is mild, you may not have symptoms.  This condition is diagnosed with blood tests as well as a medical history and physical exam. Other tests may be needed.  Treatment for this condition depends on the cause of the anemia. This information is not intended to replace advice given to you by  your health care provider. Make sure you discuss any questions you have with your health care provider. Document Released: 12/19/2004 Document Revised: 10/24/2017 Document Reviewed: 12/13/2016 Elsevier Patient Education  2020 [employer].

## 2019-08-04 NOTE — ED Notes (Addendum)
Hospitalist at bedside 

## 2019-08-04 NOTE — Progress Notes (Signed)
Subjective:  Patient ID: Claudia Lara, female    DOB: 09/04/1932  Age: 85 y.o. MRN: [id number]  CC: Anemia   HPI Claudia Lara presents for f/up - She underwent left total knee replacement about 2 weeks ago.  She tells me over the last few days her physical therapist has told her her blood pressure is low.  She has had intermittent diarrhea and complains of weakness and fatigue.  She denies dizziness, lightheadedness, chest pain, shortness of breath, palpitations, or near syncope.  She has edema in her left lower extremity.  Outpatient Medications Prior to Visit  Medication Sig Dispense Refill  . Artificial Tear Solution (SOOTHE XP OP) Place 1 drop into both eyes 2 (two) times daily.    [name] aspirin EC 325 MG EC tablet Take 1 tablet (325 mg total) by mouth 2 (two) times daily. 40 tablet 0  . Calcium Carb-Cholecalciferol (CALCIUM CARBONATE-VITAMIN D3 PO) Take 1 tablet by mouth daily.     . carvedilol (COREG) 12.5 MG tablet TAKE 1 TABLET (12.5 MG TOTAL) BY MOUTH 2 (TWO) TIMES DAILY WITH A MEAL. 180 tablet 1  . cholecalciferol (VITAMIN D) 1000 units tablet Take 1,000 Units by mouth daily.    . ferrous sulfate 325 (65 FE) MG tablet Take 1 tablet (325 mg total) by mouth 2 (two) times daily with a meal. 40 tablet 0  . gabapentin (NEURONTIN) 100 MG capsule Take 1 capsule (100 mg total) by mouth 3 (three) times daily. 1 tab 3x/day for 2 weeks. 1 tab 2x/day for 2 weeks. 1 tab a day for 2 weeks 84 capsule 0  . methocarbamol (ROBAXIN) 500 MG tablet Take 1 tablet (500 mg total) by mouth every 6 (six) hours as needed for muscle spasms. 40 tablet 0  . Multiple Vitamin (MULTIVITAMIN WITH MINERALS) TABS tablet Take 1 tablet by mouth daily.    [name] OVER THE COUNTER MEDICATION Apply 1 application topically daily as needed (knee pain). hemp freeze gel    . pantoprazole (PROTONIX) 40 MG tablet Take 1 tablet (40 mg total) by mouth daily. 90 tablet 1  . torsemide (DEMADEX) 20 MG tablet Take 1 tablet (20 mg total) by  mouth daily. 90 tablet 1  . traMADol (ULTRAM) 50 MG tablet Take 1-2 tablets (50-100 mg total) by mouth every 12 (twelve) hours as needed for moderate pain. 56 tablet 0  . vitamin C (ASCORBIC ACID) 500 MG tablet Take 500 mg by mouth daily.    [name] zinc gluconate 50 MG tablet Take 1 tablet (50 mg total) daily by mouth. 90 tablet 1  . oxyCODONE (OXY IR/ROXICODONE) 5 MG immediate release tablet Take 1-2 tablets (5-10 mg total) by mouth every 4 (four) hours as needed for severe pain. (Patient not taking: Reported on 08/04/2019) 56 tablet 0   No facility-administered medications prior to visit.     ROS Review of Systems  Constitutional: Positive for fatigue. Negative for appetite change, chills, diaphoresis and unexpected weight change.  HENT: Negative.   Eyes: Negative for visual disturbance.  Respiratory: Negative for cough, chest tightness, shortness of breath and wheezing.   Cardiovascular: Positive for leg swelling. Negative for chest pain and palpitations.  Gastrointestinal: Positive for diarrhea. Negative for abdominal pain, blood in stool, constipation, nausea and vomiting.  Genitourinary: Negative.  Negative for difficulty urinating and hematuria.  Musculoskeletal: Negative.   Skin: Positive for pallor. Negative for color change.  Neurological: Positive for weakness. Negative for dizziness, syncope and light-headedness.  Hematological: Negative for  adenopathy. Does not bruise/bleed easily.  Psychiatric/Behavioral: Negative.     Objective:  BP (!) 120/10 (BP Location: Left Arm, Patient Position: Sitting, Cuff Size: Normal)   Pulse 97   Temp 97.9 F (36.6 C) (Oral)   Resp 16   Ht 5\' 3"  (1.6 m)   Wt 171 lb (77.6 kg)   SpO2 94%   BMI 30.29 kg/m   BP Readings from Last 3 Encounters:  08/04/19 (!) 120/10  07/28/19 (!) 113/42  07/21/19 (!) 142/42    Wt Readings from Last 3 Encounters:  08/04/19 171 lb (77.6 kg)  07/27/19 170 lb 3.1 oz (77.2 kg)  07/21/19 170 lb 3.2 oz (77.2 kg)     Physical Exam Vitals signs reviewed.  Constitutional:      General: She is not in acute distress.    Appearance: She is not ill-appearing, toxic-appearing or diaphoretic.  HENT:     Nose: Nose normal.     Mouth/Throat:     Lips: No lesions.     Mouth: Mucous membranes are moist. Mucous membranes are pale.  Eyes:     General: No scleral icterus.    Conjunctiva/sclera: Conjunctivae normal.  Neck:     Musculoskeletal: Normal range of motion and neck supple. No muscular tenderness.  Cardiovascular:     Rate and Rhythm: Normal rate and regular rhythm.     Heart sounds: Murmur present. Systolic murmur present with a grade of 2/6. No diastolic murmur. No friction rub. No gallop.   Pulmonary:     Effort: Pulmonary effort is normal.     Breath sounds: No stridor. No wheezing, rhonchi or rales.  Abdominal:     General: Abdomen is flat.     Palpations: There is no mass.     Tenderness: There is no abdominal tenderness. There is no guarding.  Musculoskeletal:     Right lower leg: No edema.     Left lower leg: 2+ Edema present.  Lymphadenopathy:     Cervical: No cervical adenopathy.  Skin:    General: Skin is warm.     Coloration: Skin is pale.  Neurological:     General: No focal deficit present.     Mental Status: She is alert.  Psychiatric:        Mood and Affect: Mood normal.        Behavior: Behavior normal.     Lab Results  Component Value Date   WBC 9.8 08/04/2019   HGB 7.2 Repeated and verified X2. (LL) 08/04/2019   HCT 20.9 Repeated and verified X2. (LL) 08/04/2019   PLT 291.0 08/04/2019   GLUCOSE 140 (H) 08/04/2019   CHOL 120 06/08/2019   TRIG 104.0 06/08/2019   HDL 53.90 06/08/2019   LDLDIRECT 56.0 07/08/2018   LDLCALC 45 06/08/2019   ALT 16 07/21/2019   AST 19 07/21/2019   NA 141 08/04/2019   K 3.8 08/04/2019   CL 106 08/04/2019   CREATININE 1.51 (H) 08/04/2019   BUN 25 (H) 08/04/2019   CO2 26 08/04/2019   TSH 1.10 07/08/2018   INR 1.1 07/21/2019    HGBA1C 4.8 06/08/2019    No results found.  Assessment & Plan:   Ferne was seen today for anemia.  Diagnoses and all orders for this visit:  Need for influenza vaccination -     Flu Vaccine QUAD High Dose(Fluad)  Hypotension due to hypovolemia -     Basic metabolic panel; Future -     TSH; Future  Leg edema,  left -     Brain natriuretic peptide; Future -     Folate; Future -     TSH; Future -     D-dimer, quantitative (not at [facility]); Future  Acquired hemolytic anemia (HCC) -     CBC with Differential/Platelet; Future -     Hepatic function panel; Future -     Lactate dehydrogenase; Future  Deficiency anemia- She has a moderately severe anemia and she is symptomatic, has worsening renal function, and anearly undetectable diastolic blood pressure.  I have asked her to go to the emergency department to consider receiving blood transfusions. -     Vitamin B12; Future -     IBC panel; Future -     CBC with Differential/Platelet; Future -     Ferritin; Future -     Vitamin B1; Future -     Reticulocytes; Future  Kidney disease, chronic, stage III (GFR 30-59 ml/min) (HCC)   I am having Jennette Banker maintain her vitamin C, cholecalciferol, zinc gluconate, Calcium Carb-Cholecalciferol (CALCIUM CARBONATE-VITAMIN D3 PO), pantoprazole, torsemide, multivitamin with minerals, Artificial Tear Solution (SOOTHE XP OP), OVER THE COUNTER MEDICATION, carvedilol, aspirin, oxyCODONE, traMADol, methocarbamol, gabapentin, and ferrous sulfate.  No orders of the defined types were placed in this encounter.    Follow-up: Return in about 1 week (around 08/11/2019).  [name], MD

## 2019-08-04 NOTE — ED Notes (Signed)
Pt cannot use restroom at this time, aware urine specimen is needed.  

## 2019-08-04 NOTE — ED Notes (Signed)
Patient given food. Patient eating crackers, soup, and cheese stick with sprite to drink. Patient understands plan of care and reports she is currently okay. Will continue to monitor.

## 2019-08-04 NOTE — ED Provider Notes (Signed)
[facility] DEPT Provider Note   CSN: [id number] Arrival date & time: 08/04/19  1202     History   Chief Complaint Chief Complaint  Patient presents with  . Abnormal Lab    HPI Claudia Lara is a 85 y.o. female.     HPI Patient had left total knee replacement performed by Dr. Reynaldo Minium 10 days ago.  She is had persistent fatigue since that time.  She also notes that she had increased swelling to her left leg.  States pain is well controlled.  No shortness of breath or chest pain.  Patient denies melanotic or grossly bloody stools.  States she always needs a blood transfusion after having the procedure performed.  She was seen by her primary physician this morning and had labs which demonstrated worsening anemia and renal dysfunction.  Advised to come to the emergency department. Past Medical History:  Diagnosis Date  . ANEMIA 08/22/2009  . Aortic stenosis   . AR (aortic regurgitation) 01/2019   Mild to Moderate, Noted on ECHO  . CAROTID ARTERY STENOSIS 01/16/2010  . Complication of anesthesia    very hard to wake up from surgery  . DYSPNEA ON EXERTION 11/16/2007  . Gallstones   . GEN OSTEOARTHROSIS INVOLVING MULTIPLE SITES 11/11/2008  . GERD (gastroesophageal reflux disease)   . Grade I diastolic dysfunction 123456   Noted on ECHO  . Heart murmur   . HEMORRHOIDS, INTERNAL    surgery was in the 90's (per patient)  . History of blood transfusion    after hip and knee replacements  . NECK PAIN, ACUTE 12/28/2009  . PERIPHERAL EDEMA 10/06/2007  . PONV (postoperative nausea and vomiting)   . Pulmonary nodule 06/18/2019   noted on CT Chest   . PVD 10/06/2007  . Unspecified essential hypertension 10/19/2007    Patient Active Problem List   Diagnosis Date Noted  . Hypotension due to hypovolemia 08/04/2019  . Leg edema, left 08/04/2019  . Deficiency anemia 08/04/2019  . OA (osteoarthritis) of knee 07/26/2019  . Lung nodule 06/24/2019  . Bilateral  leg edema 06/08/2019  . Zinc deficiency 10/03/2017  . Spondylosis, lumbar, with myelopathy 04/09/2017  . Hyperglycemia 03/05/2016  . GERD (gastroesophageal reflux disease) 08/03/2015  . Hemolytic anemia ([place]) 07/12/2015  . Dysuria 05/15/2015  . Chronic venous insufficiency 08/10/2014  . Aortic stenosis, mild 06/29/2014  . Left ventricular diastolic dysfunction, NYHA class 1 06/29/2014  . Senile osteopenia 04/21/2014  . Hyperlipidemia with target LDL less than 130 04/21/2014  . Kidney disease, chronic, stage III (GFR 30-59 ml/min) ([place]) 04/21/2014  . Routine health maintenance 07/16/2012  . Carotid artery stenosis without cerebral infarction 01/16/2010  . Essential hypertension 10/19/2007    Past Surgical History:  Procedure Laterality Date  . ABDOMINAL HYSTERECTOMY    . BUNIONECTOMY     bilat  . CARPAL TUNNEL RELEASE    . CHOLECYSTECTOMY  01/29/2016   at [facility]  . CHOLECYSTECTOMY    . ENDOSCOPIC RETROGRADE CHOLANGIOPANCREATOGRAPHY (ERCP) WITH PROPOFOL N/A 12/01/2015   Procedure: ENDOSCOPIC RETROGRADE CHOLANGIOPANCREATOGRAPHY (ERCP) WITH PROPOFOL;  Surgeon: [name], MD;  Location: [facility]COPY;  Service: Endoscopy;  Laterality: N/A;  . ENDOSCOPIC RETROGRADE CHOLANGIOPANCREATOGRAPHY (ERCP) WITH PROPOFOL N/A 02/01/2016   Procedure: ENDOSCOPIC RETROGRADE CHOLANGIOPANCREATOGRAPHY (ERCP) WITH PROPOFOL;  Surgeon: [name], MD;  Location: [facility];  Service: Endoscopy;  Laterality: N/A;  . ENDOSCOPIC RETROGRADE CHOLANGIOPANCREATOGRAPHY (ERCP) WITH PROPOFOL N/A 04/07/2018   Procedure: ENDOSCOPIC RETROGRADE CHOLANGIOPANCREATOGRAPHY (ERCP) WITH PROPOFOL;  Surgeon: [name], MD;  Location: [facility];  Service: Endoscopy;  Laterality: N/A;  . HAMMER TOE SURGERY    . HEMORRHOID SURGERY    . Hyperplastic colon polyps, removed  2007   By Dr. Penelope Coop  . JOINT REPLACEMENT  2011   right knee  . OOPHORECTOMY    . REMOVAL OF STONES  04/07/2018    Procedure: REMOVAL OF STONES;  Surgeon: [name], MD;  Location: [facility];  Service: Endoscopy;;  . ROTATOR CUFF REPAIR  2004   left (Dr. Durward Fortes)  . SPHINCTEROTOMY  04/07/2018   Procedure: SPHINCTEROTOMY;  Surgeon: [name], MD;  Location: [facility];  Service: Endoscopy;;  . TOTAL HIP ARTHROPLASTY Right 11/26/2016   Procedure: RIGHT TOTAL HIP ARTHROPLASTY;  Surgeon: Garald Balding, MD;  Location: [facility];  Service: Orthopedics;  Laterality: Right;  . TOTAL KNEE ARTHROPLASTY Right   . TOTAL KNEE ARTHROPLASTY Left 07/26/2019   Procedure: TOTAL KNEE ARTHROPLASTY;  Surgeon: Gaynelle Arabian, MD;  Location: [facility];  Service: Orthopedics;  Laterality: Left;  83min     OB History   No obstetric history on file.      Home Medications    Prior to Admission medications   Medication Sig Start Date End Date Taking? Authorizing Provider  Artificial Tear Solution (SOOTHE XP OP) Place 1 drop into both eyes 2 (two) times daily.    [provider]  aspirin EC 325 MG EC tablet Take 1 tablet (325 mg total) by mouth 2 (two) times daily. 07/28/19   Constable, Amber, PA-C  Calcium Carb-Cholecalciferol (CALCIUM CARBONATE-VITAMIN D3 PO) Take 1 tablet by mouth daily.     [provider]  carvedilol (COREG) 12.5 MG tablet TAKE 1 TABLET (12.5 MG TOTAL) BY MOUTH 2 (TWO) TIMES DAILY WITH A MEAL. 07/17/19   Janith Lima, MD  cholecalciferol (VITAMIN D) 1000 units tablet Take 1,000 Units by mouth daily.    [provider]  ferrous sulfate 325 (65 FE) MG tablet Take 1 tablet (325 mg total) by mouth 2 (two) times daily with a meal. 07/28/19   Constable, Amber, PA-C  gabapentin (NEURONTIN) 100 MG capsule Take 1 capsule (100 mg total) by mouth 3 (three) times daily. 1 tab 3x/day for 2 weeks. 1 tab 2x/day for 2 weeks. 1 tab a day for 2 weeks 07/28/19   Cecilio Asper, [employer], PA-C  methocarbamol (ROBAXIN) 500 MG tablet Take 1 tablet (500 mg total) by mouth every 6 (six) hours as needed  for muscle spasms. 07/28/19   Constable, Amber, PA-C  Multiple Vitamin (MULTIVITAMIN WITH MINERALS) TABS tablet Take 1 tablet by mouth daily.    [provider]  OVER THE COUNTER MEDICATION Apply 1 application topically daily as needed (knee pain). hemp freeze gel    [provider]  oxyCODONE (OXY IR/ROXICODONE) 5 MG immediate release tablet Take 1-2 tablets (5-10 mg total) by mouth every 4 (four) hours as needed for severe pain. Patient not taking: Reported on 08/04/2019 07/28/19   Ardeen Jourdain, PA-C  pantoprazole (PROTONIX) 40 MG tablet Take 1 tablet (40 mg total) by mouth daily. 06/08/19   Janith Lima, MD  torsemide (DEMADEX) 20 MG tablet Take 1 tablet (20 mg total) by mouth daily. 06/08/19   Janith Lima, MD  traMADol (ULTRAM) 50 MG tablet Take 1-2 tablets (50-100 mg total) by mouth every 12 (twelve) hours as needed for moderate pain. 07/28/19   Constable, Amber, PA-C  vitamin C (ASCORBIC ACID) 500 MG  tablet Take 500 mg by mouth daily.    [provider]  zinc gluconate 50 MG tablet Take 1 tablet (50 mg total) daily by mouth. 10/03/17   Janith Lima, MD    Family History Family History  Problem Relation Age of Onset  . Ovarian cancer Mother   . Cancer Mother   . Leukemia Father   . Lung cancer Father   . Cancer Father   . Cancer Brother   . Diabetes Neg Hx   . Heart disease Neg Hx   . Hypertension Neg Hx     Social History Social History   Tobacco Use  . Smoking status: Never Smoker  . Smokeless tobacco: Never Used  Substance Use Topics  . Alcohol use: No  . Drug use: No     Allergies   Sulfonamide derivatives   Review of Systems Review of Systems  Constitutional: Positive for fatigue. Negative for chills and fever.  HENT: Negative for sore throat and trouble swallowing.   Eyes: Negative for visual disturbance.  Respiratory: Negative for cough and shortness of breath.   Cardiovascular: Positive for leg swelling. Negative for chest  pain and palpitations.  Gastrointestinal: Negative for abdominal pain, blood in stool, constipation, diarrhea, nausea and vomiting.  Genitourinary: Negative for dysuria, flank pain and frequency.  Musculoskeletal: Negative for back pain, myalgias and neck pain.  Skin: Negative for rash and wound.  Neurological: Positive for weakness. Negative for dizziness, light-headedness, numbness and headaches.  All other systems reviewed and are negative.    Physical Exam Updated Vital Signs BP (!) 141/46   Pulse 77   Temp 98.6 F (37 C) (Oral)   Resp 18   SpO2 100%   Physical Exam Vitals signs and nursing note reviewed.  Constitutional:      Appearance: She is well-developed.  HENT:     Head: Normocephalic and atraumatic.     Mouth/Throat:     Mouth: Mucous membranes are moist.  Eyes:     Pupils: Pupils are equal, round, and reactive to light.  Neck:     Musculoskeletal: Normal range of motion and neck supple.  Cardiovascular:     Rate and Rhythm: Normal rate and regular rhythm.     Heart sounds: No murmur. No friction rub. No gallop.   Pulmonary:     Effort: Pulmonary effort is normal. No respiratory distress.     Breath sounds: Normal breath sounds. No stridor. No wheezing, rhonchi or rales.  Chest:     Chest wall: No tenderness.  Abdominal:     General: Bowel sounds are normal.     Palpations: Abdomen is soft.     Tenderness: There is no abdominal tenderness. There is no guarding or rebound.  Musculoskeletal: Normal range of motion.        General: No swelling, tenderness or deformity.     Right lower leg: No edema.     Left lower leg: Edema present.     Comments: Surgical wound appears to be well-healing.  No erythema, warmth or fluctuance.  Patient has 3+ edema extending from the mid thigh to the foot.  Distal pulses intact.  Good distal cap refill.  Skin:    General: Skin is warm and dry.     Findings: No erythema or rash.  Neurological:     General: No focal deficit  present.     Mental Status: She is alert and oriented to person, place, and time.     Comments: Moving  all extremities without focal deficit.  Sensation is grossly intact.  Patient is able to ambulate with assistive device.  Psychiatric:        Behavior: Behavior normal.      ED Treatments / Results  Labs (all labs ordered are listed, but only abnormal results are displayed) Labs Reviewed  CBC WITH DIFFERENTIAL/PLATELET - Abnormal; Notable for the following components:      Result Value   RBC 2.24 (*)    Hemoglobin 6.9 (*)    HCT 21.9 (*)    RDW 20.6 (*)    Abs Immature Granulocytes 0.25 (*)    All other components within normal limits  COMPREHENSIVE METABOLIC PANEL - Abnormal; Notable for the following components:   Glucose, Bld 100 (*)    BUN 25 (*)    Creatinine, Ser 1.50 (*)    Total Protein 6.4 (*)    Total Bilirubin 2.0 (*)    GFR calc non Af Amer 31 (*)    GFR calc Af Amer 36 (*)    All other components within normal limits  SARS CORONAVIRUS 2 (HOSPITAL ORDER, [facility])  URINALYSIS, ROUTINE W REFLEX MICROSCOPIC  PREPARE RBC (CROSSMATCH)  TYPE AND SCREEN    EKG EKG Interpretation  Date/Time:  Wednesday August 04 2019 13:55:26 EDT Ventricular Rate:  77 PR Interval:    QRS Duration: 88 QT Interval:  404 QTC Calculation: 458 R Axis:   53 Text Interpretation:  Sinus rhythm since last tracing no significant change Confirmed by Daleen Bo [phone]) on 08/04/2019 3:06:55 PM   Radiology Vas [place] Lower Extremity Venous (dvt) (only [facility])  Result Date: 08/04/2019  Lower Venous Study Indications: Pain, Swelling, and total left knee replacement 07/26/2019.  Limitations: Body habitus and poor ultrasound/tissue interface. Comparison Study: No recent prior study. Performing Technologist: Maudry Mayhew MHA, RDMS, RVT, RDCS  Examination Guidelines: A complete evaluation includes B-mode imaging, spectral Doppler, color Doppler, and power  Doppler as needed of all accessible portions of each vessel. Bilateral testing is considered an integral part of a complete examination. Limited examinations for reoccurring indications may be performed as noted.  +---------+---------------+---------+-----------+----------+--------------+ LEFT     CompressibilityPhasicitySpontaneityPropertiesThrombus Aging +---------+---------------+---------+-----------+----------+--------------+ CFV      Full           Yes      Yes                                 +---------+---------------+---------+-----------+----------+--------------+ SFJ      Full                                                        +---------+---------------+---------+-----------+----------+--------------+ FV Prox  Full                                                        +---------+---------------+---------+-----------+----------+--------------+ FV Mid   Full                                                        +---------+---------------+---------+-----------+----------+--------------+  FV DistalFull                                                        +---------+---------------+---------+-----------+----------+--------------+ PFV      Full                                                        +---------+---------------+---------+-----------+----------+--------------+ POP      Full           Yes      Yes                                 +---------+---------------+---------+-----------+----------+--------------+ PTV      Full                                                        +---------+---------------+---------+-----------+----------+--------------+ PERO     Full                                                        +---------+---------------+---------+-----------+----------+--------------+ Gastroc  None                    No                   Acute           +---------+---------------+---------+-----------+----------+--------------+  Unable to visualize right common femoral vein due to patient position.  Summary: Left: Findings consistent with acute deep vein thrombosis involving the left gastrocnemius veins. No cystic structure found in the popliteal fossa.  *See table(s) above for measurements and observations.    Preliminary     Procedures Procedures (including critical care time)  Medications Ordered in ED Medications  0.9 %  sodium chloride infusion (has no administration in time range)    CRITICAL CARE Performed by: Julianne Rice Total critical care time: 25 minutes Critical care time was exclusive of separately billable procedures and treating other patients. Critical care was necessary to treat or prevent imminent or life-threatening deterioration. Critical care was time spent personally by me on the following activities: development of treatment plan with patient and/or surrogate as well as nursing, discussions with consultants, evaluation of patient's response to treatment, examination of patient, obtaining history from patient or surrogate, ordering and performing treatments and interventions, ordering and review of laboratory studies, ordering and review of radiographic studies, pulse oximetry and re-evaluation of patient's condition. Initial Impression / Assessment and Plan / ED Course  I have reviewed the triage vital signs and the nursing notes.  Pertinent labs & imaging results that were available during my care of the patient were reviewed by me and considered in my medical decision making (see chart for details).       Initiated blood transfusion for  symptomatic anemia.  Patient remains hemodynamically stable.  She does have a isolated DVT in the gastrocnemius vein.  Discussed with hospitalist who will admit.   Final Clinical Impressions(s) / ED Diagnoses   Final diagnoses:  Symptomatic anemia  Acute deep vein  thrombosis (DVT) of calf muscle vein of left lower extremity    ED Discharge Orders    None       Julianne Rice, MD 08/04/19 [phone]

## 2019-08-04 NOTE — H&P (Addendum)
HPI  Claudia Lara [id number] DOB: 1932-10-01 DOA: 08/04/2019  PCP: Janith Lima, MD   Chief Complaint: Weakness  HPI:  Very pleasant 85 year old white female Recent admit 831 through 07/28/2019 with elective left knee osteoarthritis-this was performed by surgeon Narda Bonds and patient was discharged and underwent therapy at home Patient states that she was feeling swollen and fatigued post surgery and when her therapist came out her blood pressure was found to be low so she called her orthopedic surgeon who prompted her to go to her regular doctor-her regular doctor did blood work and told her to come to the ED because her hemoglobin was found to be low it is unclear what it was Patient has been taking aspirin 2 tablets a day since surgery She has sometimes dark stool because she is on iron and in the past has been seen by gastroenterology Dr. Ardis Hughs and had a polypectomy in the remote past  Review of Systems:   Pertinent +'s: Weakness fatigue Pertinent -"s: Chest pain nausea vomiting fever dysuria chills Reiger  ED Course: Patient was typed and screened and started on anticoagulation  A duplex ultrasound was done on her legs which showed a left gastrocnemius DVT   Past Medical History:  Diagnosis Date  . ANEMIA 08/22/2009  . Aortic stenosis   . AR (aortic regurgitation) 01/2019   Mild to Moderate, Noted on ECHO  . CAROTID ARTERY STENOSIS 01/16/2010  . Complication of anesthesia    very hard to wake up from surgery  . DYSPNEA ON EXERTION 11/16/2007  . Gallstones   . GEN OSTEOARTHROSIS INVOLVING MULTIPLE SITES 11/11/2008  . GERD (gastroesophageal reflux disease)   . Grade I diastolic dysfunction [id number]   Noted on ECHO  . Heart murmur   . HEMORRHOIDS, INTERNAL    surgery was in the 90's (per patient)  . History of blood transfusion    after hip and knee replacements  . NECK PAIN, ACUTE 12/28/2009  . PERIPHERAL EDEMA 10/06/2007  . PONV (postoperative nausea and  vomiting)   . Pulmonary nodule 06/18/2019   noted on CT Chest   . PVD 10/06/2007  . Unspecified essential hypertension 10/19/2007   Past Surgical History:  Procedure Laterality Date  . ABDOMINAL HYSTERECTOMY    . BUNIONECTOMY     bilat  . CARPAL TUNNEL RELEASE    . CHOLECYSTECTOMY  01/29/2016   at [facility]  . CHOLECYSTECTOMY    . ENDOSCOPIC RETROGRADE CHOLANGIOPANCREATOGRAPHY (ERCP) WITH PROPOFOL N/A 12/01/2015   Procedure: ENDOSCOPIC RETROGRADE CHOLANGIOPANCREATOGRAPHY (ERCP) WITH PROPOFOL;  Surgeon: [name], MD;  Location: [facility];  Service: Endoscopy;  Laterality: N/A;  . ENDOSCOPIC RETROGRADE CHOLANGIOPANCREATOGRAPHY (ERCP) WITH PROPOFOL N/A 02/01/2016   Procedure: ENDOSCOPIC RETROGRADE CHOLANGIOPANCREATOGRAPHY (ERCP) WITH PROPOFOL;  Surgeon: [name], MD;  Location: [facility];  Service: Endoscopy;  Laterality: N/A;  . ENDOSCOPIC RETROGRADE CHOLANGIOPANCREATOGRAPHY (ERCP) WITH PROPOFOL N/A 04/07/2018   Procedure: ENDOSCOPIC RETROGRADE CHOLANGIOPANCREATOGRAPHY (ERCP) WITH PROPOFOL;  Surgeon: [name], MD;  Location: [facility];  Service: Endoscopy;  Laterality: N/A;  . HAMMER TOE SURGERY    . HEMORRHOID SURGERY    . Hyperplastic colon polyps, removed  2007   By Dr. Penelope Coop  . JOINT REPLACEMENT  2011   right knee  . OOPHORECTOMY    . REMOVAL OF STONES  04/07/2018   Procedure: REMOVAL OF STONES;  Surgeon: [name], MD;  Location: [facility];  Service: Endoscopy;;  . ROTATOR CUFF REPAIR  2004  left (Dr. Durward Fortes)  . SPHINCTEROTOMY  04/07/2018   Procedure: SPHINCTEROTOMY;  Surgeon: [name], MD;  Location: [facility];  Service: Endoscopy;;  . TOTAL HIP ARTHROPLASTY Right 11/26/2016   Procedure: RIGHT TOTAL HIP ARTHROPLASTY;  Surgeon: Garald Balding, MD;  Location: [facility];  Service: Orthopedics;  Laterality: Right;  . TOTAL KNEE ARTHROPLASTY Right   . TOTAL KNEE ARTHROPLASTY Left 07/26/2019   Procedure: TOTAL KNEE  ARTHROPLASTY;  Surgeon: Gaynelle Arabian, MD;  Location: [facility];  Service: Orthopedics;  Laterality: Left;  32min    reports that she has never smoked. She has never used smokeless tobacco. She reports that she does not drink alcohol or use drugs.  Mobility: Independent at baseline  Allergies  Allergen Reactions  . Sulfonamide Derivatives Rash    Childhood reaction   Family History  Problem Relation Age of Onset  . Ovarian cancer Mother   . Cancer Mother   . Leukemia Father   . Lung cancer Father   . Cancer Father   . Cancer Brother   . Diabetes Neg Hx   . Heart disease Neg Hx   . Hypertension Neg Hx    Prior to Admission medications   Medication Sig Start Date End Date Taking? Authorizing Provider  Artificial Tear Solution (SOOTHE XP OP) Place 1 drop into both eyes 2 (two) times daily.   Yes [provider]  aspirin EC 325 MG EC tablet Take 1 tablet (325 mg total) by mouth 2 (two) times daily. 07/28/19  Yes Constable, Amber, PA-C  Calcium Carb-Cholecalciferol (CALCIUM CARBONATE-VITAMIN D3 PO) Take 1 tablet by mouth daily.    Yes [provider]  carvedilol (COREG) 12.5 MG tablet TAKE 1 TABLET (12.5 MG TOTAL) BY MOUTH 2 (TWO) TIMES DAILY WITH A MEAL. 07/17/19  Yes Janith Lima, MD  cholecalciferol (VITAMIN D) 1000 units tablet Take 1,000 Units by mouth daily.   Yes [provider]  ferrous sulfate 325 (65 FE) MG tablet Take 1 tablet (325 mg total) by mouth 2 (two) times daily with a meal. 07/28/19  Yes Constable, Amber, PA-C  gabapentin (NEURONTIN) 100 MG capsule Take 1 capsule (100 mg total) by mouth 3 (three) times daily. 1 tab 3x/day for 2 weeks. 1 tab 2x/day for 2 weeks. 1 tab a day for 2 weeks 07/28/19  Yes Constable, Amber, PA-C  methocarbamol (ROBAXIN) 500 MG tablet Take 1 tablet (500 mg total) by mouth every 6 (six) hours as needed for muscle spasms. 07/28/19  Yes Constable, Museum/gallery conservator, PA-C  Multiple Vitamin (MULTIVITAMIN WITH MINERALS) TABS tablet Take 1  tablet by mouth daily.   Yes [provider]  OVER THE COUNTER MEDICATION Apply 1 application topically daily as needed (lower back.). hemp freeze gel    Yes [provider]  pantoprazole (PROTONIX) 40 MG tablet Take 1 tablet (40 mg total) by mouth daily. 06/08/19  Yes Janith Lima, MD  torsemide (DEMADEX) 20 MG tablet Take 1 tablet (20 mg total) by mouth daily. 06/08/19  Yes Janith Lima, MD  traMADol (ULTRAM) 50 MG tablet Take 1-2 tablets (50-100 mg total) by mouth every 12 (twelve) hours as needed for moderate pain. 07/28/19  Yes Constable, Museum/gallery conservator, PA-C  vitamin C (ASCORBIC ACID) 500 MG tablet Take 500 mg by mouth daily.   Yes [provider]  zinc gluconate 50 MG tablet Take 1 tablet (50 mg total) daily by mouth. 10/03/17  Yes Janith Lima, MD  oxyCODONE (OXY IR/ROXICODONE) 5 MG immediate  release tablet Take 1-2 tablets (5-10 mg total) by mouth every 4 (four) hours as needed for severe pain. Patient not taking: Reported on 08/04/2019 07/28/19   Ardeen Jourdain, PA-C    Physical Exam:  Vitals:   08/04/19 1645 08/04/19 1700  BP:  (!) 137/43  Pulse: 84 88  Resp: (!) 24 (!) 25  Temp:    SpO2: 98% 94%     Awake alert coherent no distress smiling no distress EOMI NCAT  Chest clinically clear no added sound  S1-S2 impressive holosystolic murmur LLSE accentuated by bearing down  Chest is clinically clear as above  Abdomen soft nondistended  Left leg has Steri-Strips over the front of the anterior aspect of the thigh and the knee covering the incision I did not examine below this  Her left leg is notably larger than the right with increased girth  I have personally reviewed following labs and imaging studies  Labs:   Sodium 141 potassium 3.8 chloride 105 CO2 25   which seems to be her baseline BUN/creatinine 25/1.5  proBNP is 305  Iron is 78 saturation ratio is 37 ferritin is 484 B12 is 3349  Imaging studies:   As above  Medical tests:    EKG independently reviewed: PR interval 0.08 QRS axis 50 degrees no ST-T wave changes across precordium  Test discussed with performing physician:  Discussed in detail with Dr. Lita Mains  Decision to obtain old records:   Briefly reviewed  Review and summation of old records:   Yes  Active Problems:   * No active hospital problems. *   Assessment/Plan Symptomatic anemia Etiology likely secondary to slow blood loss in addition to use of aspirin 325 twice daily for DVT prophylaxis At this time because she is not having dark stools or any complaints of the same I feel it is reasonable to just Hemoccult her if there is a concern further on and hold off on aspirin She is receiving 2 units of PRBC now we will repeat in a.m. Acute gastrocnemius clot of LLE I spoke with Dr. Alen Blew in detail about this on admission and he felt that this is a superficial vein and that patient DOES NOT require any form of anticoagulation at this time Recent knee surgery Continue tramadol 50-100 every 12 as needed and oxycodone for more severe pain-may use gabapentin 100 3 times daily in addition to Robaxin which I will space out from her home regimen of every 6 to every 12 as needed Hypertension?  Heart failure mild aortic stenosis Would continue Coreg 12.5 twice daily and torsemide 20 daily Needs update on echocardiogram CT chest showing pulmonary nodule Outpatient follow-up with Dr. Vaughan Browner Has abnormal PFTs Chronic osteoarthritis Prior biliary surgery with ERCP 3/9 2019 5/14 2019   Severity of Illness: The appropriate patient status for this patient is OBSERVATION. Observation status is judged to be reasonable and necessary in order to provide the required intensity of service to ensure the patient's safety. The patient's presenting symptoms, physical exam findings, and initial radiographic and laboratory data in the context of their medical condition is felt to place them at decreased risk for  further clinical deterioration. Furthermore, it is anticipated that the patient will be medically stable for discharge from the hospital within 2 midnights of admission. The following factors support the patient status of observation.   " The patient's presenting symptoms include weakness. " The physical exam findings include week. " The initial radiographic and laboratory data are anemia.   [email or web address]  DVT prophylaxis: SCDs only Code Status: Full Family Communication: None Consults called: I spoke to Dr. Alen Blew who echoed my thoughts and concerns regarding left gastrocnemius DVT and does not think that this is concerning or severe and does not need any anticoagulation-if however patient's hemoglobin is still low in the morning below the expected number of 8.5 then it may warrant GI input regarding nonemergent colonoscopy to be set up as an outpatient  Time spent: 74 minutes  Verlon Au, MD [days-call my NP partners at night for Care related issues] [facility] --Via amion app OR , [email or web address]; password [facility]  08/04/2019, 5:23 PM

## 2019-08-05 ENCOUNTER — Ambulatory Visit: Payer: Medicare Other | Admitting: Physical Therapy

## 2019-08-05 DIAGNOSIS — Z801 Family history of malignant neoplasm of trachea, bronchus and lung: Secondary | ICD-10-CM | POA: Diagnosis not present

## 2019-08-05 DIAGNOSIS — Z79891 Long term (current) use of opiate analgesic: Secondary | ICD-10-CM | POA: Diagnosis not present

## 2019-08-05 DIAGNOSIS — K921 Melena: Secondary | ICD-10-CM | POA: Diagnosis not present

## 2019-08-05 DIAGNOSIS — I352 Nonrheumatic aortic (valve) stenosis with insufficiency: Secondary | ICD-10-CM | POA: Diagnosis present

## 2019-08-05 DIAGNOSIS — Z882 Allergy status to sulfonamides status: Secondary | ICD-10-CM | POA: Diagnosis not present

## 2019-08-05 DIAGNOSIS — N183 Chronic kidney disease, stage 3 (moderate): Secondary | ICD-10-CM | POA: Diagnosis present

## 2019-08-05 DIAGNOSIS — D631 Anemia in chronic kidney disease: Secondary | ICD-10-CM | POA: Diagnosis present

## 2019-08-05 DIAGNOSIS — Z79899 Other long term (current) drug therapy: Secondary | ICD-10-CM | POA: Diagnosis not present

## 2019-08-05 DIAGNOSIS — K219 Gastro-esophageal reflux disease without esophagitis: Secondary | ICD-10-CM | POA: Diagnosis present

## 2019-08-05 DIAGNOSIS — K2950 Unspecified chronic gastritis without bleeding: Secondary | ICD-10-CM | POA: Diagnosis present

## 2019-08-05 DIAGNOSIS — Z20828 Contact with and (suspected) exposure to other viral communicable diseases: Secondary | ICD-10-CM | POA: Diagnosis present

## 2019-08-05 DIAGNOSIS — Z9049 Acquired absence of other specified parts of digestive tract: Secondary | ICD-10-CM | POA: Diagnosis not present

## 2019-08-05 DIAGNOSIS — K317 Polyp of stomach and duodenum: Secondary | ICD-10-CM | POA: Diagnosis not present

## 2019-08-05 DIAGNOSIS — D649 Anemia, unspecified: Secondary | ICD-10-CM

## 2019-08-05 DIAGNOSIS — Z8041 Family history of malignant neoplasm of ovary: Secondary | ICD-10-CM | POA: Diagnosis not present

## 2019-08-05 DIAGNOSIS — K254 Chronic or unspecified gastric ulcer with hemorrhage: Secondary | ICD-10-CM | POA: Diagnosis not present

## 2019-08-05 DIAGNOSIS — Z90710 Acquired absence of both cervix and uterus: Secondary | ICD-10-CM | POA: Diagnosis not present

## 2019-08-05 DIAGNOSIS — I130 Hypertensive heart and chronic kidney disease with heart failure and stage 1 through stage 4 chronic kidney disease, or unspecified chronic kidney disease: Secondary | ICD-10-CM | POA: Diagnosis not present

## 2019-08-05 DIAGNOSIS — I6529 Occlusion and stenosis of unspecified carotid artery: Secondary | ICD-10-CM | POA: Diagnosis present

## 2019-08-05 DIAGNOSIS — Z7982 Long term (current) use of aspirin: Secondary | ICD-10-CM | POA: Diagnosis not present

## 2019-08-05 DIAGNOSIS — I82462 Acute embolism and thrombosis of left calf muscular vein: Secondary | ICD-10-CM | POA: Diagnosis not present

## 2019-08-05 DIAGNOSIS — R911 Solitary pulmonary nodule: Secondary | ICD-10-CM | POA: Diagnosis present

## 2019-08-05 DIAGNOSIS — K449 Diaphragmatic hernia without obstruction or gangrene: Secondary | ICD-10-CM | POA: Diagnosis not present

## 2019-08-05 DIAGNOSIS — Z96641 Presence of right artificial hip joint: Secondary | ICD-10-CM | POA: Diagnosis present

## 2019-08-05 DIAGNOSIS — Z86010 Personal history of colonic polyps: Secondary | ICD-10-CM | POA: Diagnosis not present

## 2019-08-05 DIAGNOSIS — K253 Acute gastric ulcer without hemorrhage or perforation: Secondary | ICD-10-CM | POA: Diagnosis not present

## 2019-08-05 DIAGNOSIS — I5032 Chronic diastolic (congestive) heart failure: Secondary | ICD-10-CM | POA: Diagnosis not present

## 2019-08-05 DIAGNOSIS — D62 Acute posthemorrhagic anemia: Secondary | ICD-10-CM | POA: Diagnosis not present

## 2019-08-05 DIAGNOSIS — Z96653 Presence of artificial knee joint, bilateral: Secondary | ICD-10-CM | POA: Diagnosis present

## 2019-08-05 DIAGNOSIS — Z806 Family history of leukemia: Secondary | ICD-10-CM | POA: Diagnosis not present

## 2019-08-05 LAB — COMPREHENSIVE METABOLIC PANEL
ALT: 11 U/L (ref 0–44)
AST: 17 U/L (ref 15–41)
Albumin: 3.6 g/dL (ref 3.5–5.0)
Alkaline Phosphatase: 63 U/L (ref 38–126)
Anion gap: 9 (ref 5–15)
BUN: 23 mg/dL (ref 8–23)
CO2: 22 mmol/L (ref 22–32)
Calcium: 8.1 mg/dL — ABNORMAL LOW (ref 8.9–10.3)
Chloride: 108 mmol/L (ref 98–111)
Creatinine, Ser: 1.27 mg/dL — ABNORMAL HIGH (ref 0.44–1.00)
GFR calc Af Amer: 45 mL/min — ABNORMAL LOW (ref >60)
GFR calc non Af Amer: 38 mL/min — ABNORMAL LOW (ref >60)
Glucose, Bld: 108 mg/dL — ABNORMAL HIGH (ref 70–99)
Potassium: 3.9 mmol/L (ref 3.5–5.1)
Sodium: 139 mmol/L (ref 135–145)
Total Bilirubin: 2.6 mg/dL — ABNORMAL HIGH (ref 0.3–1.2)
Total Protein: 5.9 g/dL — ABNORMAL LOW (ref 6.5–8.1)

## 2019-08-05 LAB — CBC
HCT: 23.6 % — ABNORMAL LOW (ref 36.0–46.0)
Hemoglobin: 7.7 g/dL — ABNORMAL LOW (ref 12.0–15.0)
MCH: 31.3 pg (ref 26.0–34.0)
MCHC: 32.6 g/dL (ref 30.0–36.0)
MCV: 95.9 fL (ref 80.0–100.0)
Platelets: 257 10*3/uL (ref 150–400)
RBC: 2.46 MIL/uL — ABNORMAL LOW (ref 3.87–5.11)
RDW: 19.8 % — ABNORMAL HIGH (ref 11.5–15.5)
WBC: 8.4 10*3/uL (ref 4.0–10.5)
nRBC: 0.2 % (ref 0.0–0.2)

## 2019-08-05 LAB — MAGNESIUM: Magnesium: 2.5 mg/dL — ABNORMAL HIGH (ref 1.7–2.4)

## 2019-08-05 LAB — OCCULT BLOOD X 1 CARD TO LAB, STOOL: Fecal Occult Bld: NEGATIVE

## 2019-08-05 LAB — PREPARE RBC (CROSSMATCH)

## 2019-08-05 LAB — PHOSPHORUS: Phosphorus: 4 mg/dL (ref 2.5–4.6)

## 2019-08-05 MED ORDER — SODIUM CHLORIDE 0.9% IV SOLUTION: Freq: Once | INTRAVENOUS | Status: DC

## 2019-08-05 MED ORDER — PANTOPRAZOLE SODIUM 40 MG IV SOLR
40.0000 mg | Freq: Two times a day (BID) | INTRAVENOUS | Status: DC
  Administered 2019-08-05: 40 mg via INTRAVENOUS
  Filled 2019-08-05: qty 40

## 2019-08-05 MED ORDER — ACYCLOVIR 5 % EX OINT
TOPICAL_OINTMENT | CUTANEOUS | Status: DC
  Administered 2019-08-05 – 2019-08-06 (×3): via TOPICAL
  Administered 2019-08-06: 1 via TOPICAL
  Filled 2019-08-05: qty 15

## 2019-08-05 MED ORDER — PANTOPRAZOLE SODIUM 40 MG PO TBEC: 40.0000 mg | DELAYED_RELEASE_TABLET | Freq: Two times a day (BID) | ORAL | Status: DC

## 2019-08-05 NOTE — Consult Note (Addendum)
Referring Provider:  [facility]          Primary Care Physician:  Janith Lima, MD Primary Gastroenterologist:  Owens Loffler, MD        Reason for Consultation:   Anemia                ASSESSMENT /  PLAN    1. 85 yo female with acute on chronic anemia in setting of NSAIDS. Stools chronically dark on iron but now loose.Trival elevation in BUN.  Baseline hgb ~10 , down to 7.7 two days post knee surgery on 07/26/19. Presented yesterday with further decline in hgb to 6.9.  Taking two asa daily since knee surgery. Rule out erosive disease / PUD.  No evidence for iron deficiency. MCV normal. Ferritin in 400's.  -Will need EGD in am. The risks and benefits of EGD were discussed and the patient agrees to proceed.  -Hgb 6.9 >> 7.6 after one uPRBC, getting a second unit now -BID PPI  2.GERD, asymptomatic on daily PPI   3. Hx of colon polyps -sessile serrated adenomas Nov 2016. No longer undergoing surveillance colonoscopies  4. ? DVT of left gastrocnemius vein. Spoke with Hospitalist and no plans for anticoagulation it sounds.   HPI:     Claudia Lara is a 85 y.o. female known to Dr. Ardis Hughs for history of anemia, H.pylori gastritis, sessile serrated adenomas and also hx of CBD stones s/p ERCP x 2 in 2017 and again in May 2019. Patient had a total knee arthroplasty late August. A few days prior to surgery her hgb was 9.6, postoperatively it was 8.2. She has been taking 2 asa daily since knee surgery, no other NSAIDS. She was seen by PCP yesterday, c/o dizziness. Hgb was 7.2. Patient sent to ED and repeat hgb was 6.9. She was admitted for symptomatic anemia.  She received a uPRBC last evening with rise in hgb to only 7.7. She is on chronic iron and so stools are always dark. However, a couple of days ago stools became loose and more frequent. No abdominal pain. No nausea. She feels like dizziness is better but really hasn't been out of bed.   Previous GI studies: Evaluation for anemia,  FOBT+ Colonoscopy  4 polyps  3-5 mm  Path SSA  EGD 4 cm HH and non-specific gastritis  Path chronic active gastritis, H.pylori +  Past Medical History:  Diagnosis Date  . ANEMIA 08/22/2009  . Aortic stenosis   . AR (aortic regurgitation) 01/2019   Mild to Moderate, Noted on ECHO  . CAROTID ARTERY STENOSIS 01/16/2010  . Complication of anesthesia    very hard to wake up from surgery  . DYSPNEA ON EXERTION 11/16/2007  . Gallstones   . GEN OSTEOARTHROSIS INVOLVING MULTIPLE SITES 11/11/2008  . GERD (gastroesophageal reflux disease)   . Grade I diastolic dysfunction [id number]   Noted on ECHO  . Heart murmur   . HEMORRHOIDS, INTERNAL    surgery was in the 90's (per patient)  . History of blood transfusion    after hip and knee replacements  . NECK PAIN, ACUTE 12/28/2009  . PERIPHERAL EDEMA 10/06/2007  . PONV (postoperative nausea and vomiting)   . Pulmonary nodule 06/18/2019   noted on CT Chest   . PVD 10/06/2007  . Unspecified essential hypertension 10/19/2007    Past Surgical History:  Procedure Laterality Date  . ABDOMINAL HYSTERECTOMY    . BUNIONECTOMY     bilat  . CARPAL TUNNEL RELEASE    .  CHOLECYSTECTOMY  01/29/2016   at [facility]  . CHOLECYSTECTOMY    . ENDOSCOPIC RETROGRADE CHOLANGIOPANCREATOGRAPHY (ERCP) WITH PROPOFOL N/A 12/01/2015   Procedure: ENDOSCOPIC RETROGRADE CHOLANGIOPANCREATOGRAPHY (ERCP) WITH PROPOFOL;  Surgeon: [name], MD;  Location: [facility];  Service: Endoscopy;  Laterality: N/A;  . ENDOSCOPIC RETROGRADE CHOLANGIOPANCREATOGRAPHY (ERCP) WITH PROPOFOL N/A 02/01/2016   Procedure: ENDOSCOPIC RETROGRADE CHOLANGIOPANCREATOGRAPHY (ERCP) WITH PROPOFOL;  Surgeon: [name], MD;  Location: [facility];  Service: Endoscopy;  Laterality: N/A;  . ENDOSCOPIC RETROGRADE CHOLANGIOPANCREATOGRAPHY (ERCP) WITH PROPOFOL N/A 04/07/2018   Procedure: ENDOSCOPIC RETROGRADE CHOLANGIOPANCREATOGRAPHY (ERCP) WITH PROPOFOL;  Surgeon: [name],  MD;  Location: [facility];  Service: Endoscopy;  Laterality: N/A;  . HAMMER TOE SURGERY    . HEMORRHOID SURGERY    . Hyperplastic colon polyps, removed  2007   By Dr. Penelope Coop  . JOINT REPLACEMENT  2011   right knee  . OOPHORECTOMY    . REMOVAL OF STONES  04/07/2018   Procedure: REMOVAL OF STONES;  Surgeon: [name], MD;  Location: [facility];  Service: Endoscopy;;  . ROTATOR CUFF REPAIR  2004   left (Dr. Durward Fortes)  . SPHINCTEROTOMY  04/07/2018   Procedure: SPHINCTEROTOMY;  Surgeon: [name], MD;  Location: [facility];  Service: Endoscopy;;  . TOTAL HIP ARTHROPLASTY Right 11/26/2016   Procedure: RIGHT TOTAL HIP ARTHROPLASTY;  Surgeon: Garald Balding, MD;  Location: [place];  Service: Orthopedics;  Laterality: Right;  . TOTAL KNEE ARTHROPLASTY Right   . TOTAL KNEE ARTHROPLASTY Left 07/26/2019   Procedure: TOTAL KNEE ARTHROPLASTY;  Surgeon: Gaynelle Arabian, MD;  Location: [facility];  Service: Orthopedics;  Laterality: Left;  17min    Prior to Admission medications   Medication Sig Start Date End Date Taking? Authorizing Provider  Artificial Tear Solution (SOOTHE XP OP) Place 1 drop into both eyes 2 (two) times daily.   Yes [provider]  aspirin EC 325 MG EC tablet Take 1 tablet (325 mg total) by mouth 2 (two) times daily. 07/28/19  Yes Constable, Amber, PA-C  Calcium Carb-Cholecalciferol (CALCIUM CARBONATE-VITAMIN D3 PO) Take 1 tablet by mouth daily.    Yes [provider]  carvedilol (COREG) 12.5 MG tablet TAKE 1 TABLET (12.5 MG TOTAL) BY MOUTH 2 (TWO) TIMES DAILY WITH A MEAL. 07/17/19  Yes Janith Lima, MD  cholecalciferol (VITAMIN D) 1000 units tablet Take 1,000 Units by mouth daily.   Yes [provider]  ferrous sulfate 325 (65 FE) MG tablet Take 1 tablet (325 mg total) by mouth 2 (two) times daily with a meal. 07/28/19  Yes Constable, Amber, PA-C  gabapentin (NEURONTIN) 100 MG capsule Take 1 capsule (100 mg total) by mouth 3 (three) times  daily. 1 tab 3x/day for 2 weeks. 1 tab 2x/day for 2 weeks. 1 tab a day for 2 weeks 07/28/19  Yes Constable, Amber, PA-C  methocarbamol (ROBAXIN) 500 MG tablet Take 1 tablet (500 mg total) by mouth every 6 (six) hours as needed for muscle spasms. 07/28/19  Yes Constable, Museum/gallery conservator, PA-C  Multiple Vitamin (MULTIVITAMIN WITH MINERALS) TABS tablet Take 1 tablet by mouth daily.   Yes [provider]  OVER THE COUNTER MEDICATION Apply 1 application topically daily as needed (lower back.). hemp freeze gel    Yes [provider]  pantoprazole (PROTONIX) 40 MG tablet Take 1 tablet (40 mg total) by mouth daily. 06/08/19  Yes Janith Lima, MD  torsemide (DEMADEX) 20 MG tablet Take 1 tablet (20 mg  total) by mouth daily. 06/08/19  Yes Janith Lima, MD  traMADol (ULTRAM) 50 MG tablet Take 1-2 tablets (50-100 mg total) by mouth every 12 (twelve) hours as needed for moderate pain. 07/28/19  Yes Constable, Museum/gallery conservator, PA-C  vitamin C (ASCORBIC ACID) 500 MG tablet Take 500 mg by mouth daily.   Yes [provider]  zinc gluconate 50 MG tablet Take 1 tablet (50 mg total) daily by mouth. 10/03/17  Yes Janith Lima, MD  oxyCODONE (OXY IR/ROXICODONE) 5 MG immediate release tablet Take 1-2 tablets (5-10 mg total) by mouth every 4 (four) hours as needed for severe pain. Patient not taking: Reported on 08/04/2019 07/28/19   Ardeen Jourdain, PA-C    Current Facility-Administered Medications  Medication Dose Route Frequency Provider Last Rate Last Dose  . 0.9 %  sodium chloride infusion (Manually program via Guardrails IV Fluids)   Intravenous Once Sheikh, Omair Latif, DO      . 0.9 %  sodium chloride infusion  10 mL/hr Intravenous Once Julianne Rice, MD      . calcium-vitamin D (OSCAL WITH D) 500-200 MG-UNIT per tablet 1 tablet  1 tablet Oral Daily Nita Sells, MD   1 tablet at 08/05/19 1003  . carvedilol (COREG) tablet 12.5 mg  12.5 mg Oral BID WC Nita Sells, MD   12.5 mg at 08/05/19  0755  . cholecalciferol (VITAMIN D3) tablet 1,000 Units  1,000 Units Oral Daily Nita Sells, MD   1,000 Units at 08/05/19 1003  . ferrous sulfate tablet 325 mg  325 mg Oral BID WC Nita Sells, MD   325 mg at 08/05/19 0754  . gabapentin (NEURONTIN) capsule 100 mg  100 mg Oral TID Nita Sells, MD   100 mg at 08/05/19 1002  . methocarbamol (ROBAXIN) tablet 500 mg  500 mg Oral Q8H PRN Nita Sells, MD      . multivitamin with minerals tablet 1 tablet  1 tablet Oral Daily Nita Sells, MD   1 tablet at 08/05/19 0959  . oxyCODONE (Oxy IR/ROXICODONE) immediate release tablet 5-10 mg  5-10 mg Oral Q4H PRN Nita Sells, MD      . pantoprazole (PROTONIX) EC tablet 40 mg  40 mg Oral Daily Nita Sells, MD   40 mg at 08/05/19 0959  . polyvinyl alcohol (LIQUIFILM TEARS) 1.4 % ophthalmic solution 1 drop  1 drop Both Eyes BID Nita Sells, MD   1 drop at 08/05/19 1005  . torsemide (DEMADEX) tablet 20 mg  20 mg Oral Daily Nita Sells, MD   20 mg at 08/05/19 1000  . traMADol (ULTRAM) tablet 50-100 mg  50-100 mg Oral Q12H PRN Nita Sells, MD      . vitamin C (ASCORBIC ACID) tablet 500 mg  500 mg Oral Daily Nita Sells, MD   500 mg at 08/05/19 1003  . zinc sulfate capsule 220 mg  220 mg Oral Daily Nita Sells, MD   220 mg at 08/05/19 1000    Allergies as of 08/04/2019 - Review Complete 08/04/2019  Allergen Reaction Noted  . Sulfonamide derivatives Rash     Family History  Problem Relation Age of Onset  . Ovarian cancer Mother   . Cancer Mother   . Leukemia Father   . Lung cancer Father   . Cancer Father   . Cancer Brother   . Diabetes Neg Hx   . Heart disease Neg Hx   . Hypertension Neg Hx     Social History   Socioeconomic History  .  Marital status: Divorced    Spouse name: Not on file  . Number of children: 1  . Years of education: 14  . Highest education level: Not on file  Occupational  History  . 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 Financial resource strain: Not on file  . Food insecurity    Worry: Not on file    Inability: Not on file  . Transportation needs    Medical: Not on file    Non-medical: Not on file  Tobacco Use  . Smoking status: Never Smoker  . Smokeless tobacco: Never Used  Substance and Sexual Activity  . Alcohol use: No  . Drug use: No  . Sexual activity: Not Currently    Birth control/protection: Surgical  Lifestyle  . Physical activity    Days per week: Not on file    Minutes per session: Not on file  . Stress: Not on file  Relationships  . Social Herbalist on phone: Not on file    Gets together: Not on file    Attends religious service: Not on file    Active member of club or organization: Not on file    Attends meetings of clubs or organizations: Not on file    Relationship status: Not on file  . Intimate partner violence    Fear of current or ex partner: Not on file    Emotionally abused: Not on file    Physically abused: Not on file    Forced sexual activity: Not on file  Other Topics Concern  . Not on file  Social History Narrative   HSG. Married - divorced for many years.  Retired. Lives alone - very active and independent.    Review of Systems: All systems reviewed and negative except where noted in HPI.  Physical Exam: Vital signs in last 24 hours: Temp:  [97.4 F (36.3 C)-98.7 F (37.1 C)] 98.4 F (36.9 C) (09/10 0531) Pulse Rate:  [77-95] 95 (09/10 0531) Resp:  [16-25] 16 (09/10 0531) BP: (128-164)/(42-143) 150/59 (09/10 0531) SpO2:  [94 %-100 %] 94 % (09/10 0531) Weight:  [78.5 kg] 78.5 kg (09/09 2059) Last BM Date: 08/03/19 General:   Alert, well-developed,  female in NAD Psych:  Pleasant, cooperative. Normal mood and affect. Eyes:  Pupils equal, sclera clear, no icterus.   Conjunctiva pink. Ears:  Normal auditory acuity. Nose:  No deformity,  discharge,  or lesions. Neck:  Supple; no masses Lungs:  Clear throughout to auscultation.   No wheezes, crackles, or rhonchi.  Heart:  Regular rate and rhythm;  no lower extremity edema Abdomen:  Soft, non-distended, nontender, BS active, no palp mass Rectal:  Deferred  Msk:  Symmetrical without gross deformities. . Neurologic:  Alert and  oriented x4;  grossly normal neurologically. Skin:  Intact without significant lesions or rashes.   Intake/Output from previous day: 09/09 0701 - 09/10 0700 In: 672 [P.O.:120; Blood:552] Out: -  Intake/Output this shift: Total I/O In: 240 [P.O.:240] Out: -   Lab Results: Recent Labs    08/04/19 1054 08/04/19 1408 08/04/19 2117 08/05/19 0246  WBC 9.8 9.3  --  8.4  HGB 7.2 Repeated and verified X2.* 6.9* 7.6* 7.7*  HCT 20.9 Repeated and verified X2.* 21.9* 24.1* 23.6*  PLT 291.0 273  --  257   BMET Recent Labs    08/04/19 1054 08/04/19 1408 08/05/19 0246  NA 141 141 139  K 3.8  3.8 3.9  CL 106 105 108  CO2 26 25 22   GLUCOSE 140* 100* 108*  BUN 25* 25* 23  CREATININE 1.51* 1.50* 1.27*  CALCIUM 8.8 8.9 8.1*   LFT Recent Labs    08/04/19 1054  08/05/19 0246  PROT 6.1   < > 5.9*  ALBUMIN 3.8   < > 3.6  AST 12   < > 17  ALT 9   < > 11  ALKPHOS 65   < > 63  BILITOT 2.0*   < > 2.6*  BILIDIR 0.4*  --   --    < > = values in this interval not displayed.   PT/INR No results for input(s): LABPROT, INR in the last 72 hours. Hepatitis Panel No results for input(s): HEPBSAG, HCVAB, HEPAIGM, HEPBIGM in the last 72 hours.   . CBC Latest Ref Rng & Units 08/05/2019 08/04/2019 08/04/2019  WBC 4.0 - 10.5 K/uL 8.4 - 9.3  Hemoglobin 12.0 - 15.0 g/dL 7.7(L) 7.6(L) 6.9(LL)  Hematocrit 36.0 - 46.0 % 23.6(L) 24.1(L) 21.9(L)  Platelets 150 - 400 K/uL 257 - 273    . CMP Latest Ref Rng & Units 08/05/2019 08/04/2019 08/04/2019  Glucose 70 - 99 mg/dL 108(H) 100(H) 140(H)  BUN 8 - 23 mg/dL 23 25(H) 25(H)  Creatinine 0.44 - 1.00 mg/dL 1.27(H)  1.50(H) 1.51(H)  Sodium 135 - 145 mmol/L 139 141 141  Potassium 3.5 - 5.1 mmol/L 3.9 3.8 3.8  Chloride 98 - 111 mmol/L 108 105 106  CO2 22 - 32 mmol/L 22 25 26   Calcium 8.9 - 10.3 mg/dL 8.1(L) 8.9 8.8  Total Protein 6.5 - 8.1 g/dL 5.9(L) 6.4(L) 6.1  Total Bilirubin 0.3 - 1.2 mg/dL 2.6(H) 2.0(H) 2.0(H)  Alkaline Phos 38 - 126 U/L 63 66 65  AST 15 - 41 U/L 17 15 12   ALT 0 - 44 U/L 11 13 9    Studies/Results: Vas [place] Lower Extremity Venous (dvt) (only [facility])  Result Date: 08/04/2019  Lower Venous Study Indications: Pain, Swelling, and total left knee replacement 07/26/2019.  Limitations: Body habitus and poor ultrasound/tissue interface. Comparison Study: No recent prior study. Performing Technologist: Maudry Mayhew MHA, RDMS, RVT, RDCS  Examination Guidelines: A complete evaluation includes B-mode imaging, spectral Doppler, color Doppler, and power Doppler as needed of all accessible portions of each vessel. Bilateral testing is considered an integral part of a complete examination. Limited examinations for reoccurring indications may be performed as noted.  +---------+---------------+---------+-----------+----------+--------------+ LEFT     CompressibilityPhasicitySpontaneityPropertiesThrombus Aging +---------+---------------+---------+-----------+----------+--------------+ CFV      Full           Yes      Yes                                 +---------+---------------+---------+-----------+----------+--------------+ SFJ      Full                                                        +---------+---------------+---------+-----------+----------+--------------+ FV Prox  Full                                                        +---------+---------------+---------+-----------+----------+--------------+  FV Mid   Full                                                        +---------+---------------+---------+-----------+----------+--------------+ FV DistalFull                                                         +---------+---------------+---------+-----------+----------+--------------+ PFV      Full                                                        +---------+---------------+---------+-----------+----------+--------------+ POP      Full           Yes      Yes                                 +---------+---------------+---------+-----------+----------+--------------+ PTV      Full                                                        +---------+---------------+---------+-----------+----------+--------------+ PERO     Full                                                        +---------+---------------+---------+-----------+----------+--------------+ Gastroc  None                    No                   Acute          +---------+---------------+---------+-----------+----------+--------------+  Unable to visualize right common femoral vein due to patient position.  Summary: Left: Findings consistent with acute deep vein thrombosis involving the left gastrocnemius veins. No cystic structure found in the popliteal fossa.  *See table(s) above for measurements and observations. Electronically signed by Curt Jews MD on 08/04/2019 at 4:46:26 PM.    Final     Active Problems:   Anemia    Tye Savoy, NP-C @  08/05/2019, 11:15 AM

## 2019-08-05 NOTE — H&P (View-Only) (Signed)
Referring Provider:  [facility]          Primary Care Physician:  Janith Lima, MD Primary Gastroenterologist:  Owens Loffler, MD        Reason for Consultation:   Anemia                ASSESSMENT /  PLAN    1. 85 yo female with acute on chronic anemia in setting of NSAIDS. Stools chronically dark on iron but now loose.Trival elevation in BUN.  Baseline hgb ~10 , down to 7.7 two days post knee surgery on 07/26/19. Presented yesterday with further decline in hgb to 6.9.  Taking two asa daily since knee surgery. Rule out erosive disease / PUD.  No evidence for iron deficiency. MCV normal. Ferritin in 400's.  -Will need EGD in am. The risks and benefits of EGD were discussed and the patient agrees to proceed.  -Hgb 6.9 >> 7.6 after one uPRBC, getting a second unit now -BID PPI  2.GERD, asymptomatic on daily PPI   3. Hx of colon polyps -sessile serrated adenomas Nov 2016. No longer undergoing surveillance colonoscopies  4. ? DVT of left gastrocnemius vein. Spoke with Hospitalist and no plans for anticoagulation it sounds.   HPI:     Claudia Lara is a 85 y.o. female known to Dr. Ardis Hughs for history of anemia, H.pylori gastritis, sessile serrated adenomas and also hx of CBD stones s/p ERCP x 2 in 2017 and again in May 2019. Patient had a total knee arthroplasty late August. A few days prior to surgery her hgb was 9.6, postoperatively it was 8.2. She has been taking 2 asa daily since knee surgery, no other NSAIDS. She was seen by PCP yesterday, c/o dizziness. Hgb was 7.2. Patient sent to ED and repeat hgb was 6.9. She was admitted for symptomatic anemia.  She received a uPRBC last evening with rise in hgb to only 7.7. She is on chronic iron and so stools are always dark. However, a couple of days ago stools became loose and more frequent. No abdominal pain. No nausea. She feels like dizziness is better but really hasn't been out of bed.   Previous GI studies: Evaluation for anemia,  FOBT+ Colonoscopy  4 polyps  3-5 mm  Path SSA  EGD 4 cm HH and non-specific gastritis  Path chronic active gastritis, H.pylori +  Past Medical History:  Diagnosis Date  . ANEMIA 08/22/2009  . Aortic stenosis   . AR (aortic regurgitation) 01/2019   Mild to Moderate, Noted on ECHO  . CAROTID ARTERY STENOSIS 01/16/2010  . Complication of anesthesia    very hard to wake up from surgery  . DYSPNEA ON EXERTION 11/16/2007  . Gallstones   . GEN OSTEOARTHROSIS INVOLVING MULTIPLE SITES 11/11/2008  . GERD (gastroesophageal reflux disease)   . Grade I diastolic dysfunction [id number]   Noted on ECHO  . Heart murmur   . HEMORRHOIDS, INTERNAL    surgery was in the 90's (per patient)  . History of blood transfusion    after hip and knee replacements  . NECK PAIN, ACUTE 12/28/2009  . PERIPHERAL EDEMA 10/06/2007  . PONV (postoperative nausea and vomiting)   . Pulmonary nodule 06/18/2019   noted on CT Chest   . PVD 10/06/2007  . Unspecified essential hypertension 10/19/2007    Past Surgical History:  Procedure Laterality Date  . ABDOMINAL HYSTERECTOMY    . BUNIONECTOMY     bilat  . CARPAL TUNNEL RELEASE    .  CHOLECYSTECTOMY  01/29/2016   at [facility]  . CHOLECYSTECTOMY    . ENDOSCOPIC RETROGRADE CHOLANGIOPANCREATOGRAPHY (ERCP) WITH PROPOFOL N/A 12/01/2015   Procedure: ENDOSCOPIC RETROGRADE CHOLANGIOPANCREATOGRAPHY (ERCP) WITH PROPOFOL;  Surgeon: [name], MD;  Location: [facility];  Service: Endoscopy;  Laterality: N/A;  . ENDOSCOPIC RETROGRADE CHOLANGIOPANCREATOGRAPHY (ERCP) WITH PROPOFOL N/A 02/01/2016   Procedure: ENDOSCOPIC RETROGRADE CHOLANGIOPANCREATOGRAPHY (ERCP) WITH PROPOFOL;  Surgeon: [name], MD;  Location: [facility];  Service: Endoscopy;  Laterality: N/A;  . ENDOSCOPIC RETROGRADE CHOLANGIOPANCREATOGRAPHY (ERCP) WITH PROPOFOL N/A 04/07/2018   Procedure: ENDOSCOPIC RETROGRADE CHOLANGIOPANCREATOGRAPHY (ERCP) WITH PROPOFOL;  Surgeon: [name],  MD;  Location: [facility];  Service: Endoscopy;  Laterality: N/A;  . HAMMER TOE SURGERY    . HEMORRHOID SURGERY    . Hyperplastic colon polyps, removed  2007   By Dr. Penelope Coop  . JOINT REPLACEMENT  2011   right knee  . OOPHORECTOMY    . REMOVAL OF STONES  04/07/2018   Procedure: REMOVAL OF STONES;  Surgeon: [name], MD;  Location: [facility];  Service: Endoscopy;;  . ROTATOR CUFF REPAIR  2004   left (Dr. Durward Fortes)  . SPHINCTEROTOMY  04/07/2018   Procedure: SPHINCTEROTOMY;  Surgeon: [name], MD;  Location: [facility];  Service: Endoscopy;;  . TOTAL HIP ARTHROPLASTY Right 11/26/2016   Procedure: RIGHT TOTAL HIP ARTHROPLASTY;  Surgeon: Garald Balding, MD;  Location: [place];  Service: Orthopedics;  Laterality: Right;  . TOTAL KNEE ARTHROPLASTY Right   . TOTAL KNEE ARTHROPLASTY Left 07/26/2019   Procedure: TOTAL KNEE ARTHROPLASTY;  Surgeon: Gaynelle Arabian, MD;  Location: [facility];  Service: Orthopedics;  Laterality: Left;  59min    Prior to Admission medications   Medication Sig Start Date End Date Taking? Authorizing Provider  Artificial Tear Solution (SOOTHE XP OP) Place 1 drop into both eyes 2 (two) times daily.   Yes [provider]  aspirin EC 325 MG EC tablet Take 1 tablet (325 mg total) by mouth 2 (two) times daily. 07/28/19  Yes Constable, Amber, PA-C  Calcium Carb-Cholecalciferol (CALCIUM CARBONATE-VITAMIN D3 PO) Take 1 tablet by mouth daily.    Yes [provider]  carvedilol (COREG) 12.5 MG tablet TAKE 1 TABLET (12.5 MG TOTAL) BY MOUTH 2 (TWO) TIMES DAILY WITH A MEAL. 07/17/19  Yes Janith Lima, MD  cholecalciferol (VITAMIN D) 1000 units tablet Take 1,000 Units by mouth daily.   Yes [provider]  ferrous sulfate 325 (65 FE) MG tablet Take 1 tablet (325 mg total) by mouth 2 (two) times daily with a meal. 07/28/19  Yes Constable, Amber, PA-C  gabapentin (NEURONTIN) 100 MG capsule Take 1 capsule (100 mg total) by mouth 3 (three) times  daily. 1 tab 3x/day for 2 weeks. 1 tab 2x/day for 2 weeks. 1 tab a day for 2 weeks 07/28/19  Yes Constable, Amber, PA-C  methocarbamol (ROBAXIN) 500 MG tablet Take 1 tablet (500 mg total) by mouth every 6 (six) hours as needed for muscle spasms. 07/28/19  Yes Constable, Museum/gallery conservator, PA-C  Multiple Vitamin (MULTIVITAMIN WITH MINERALS) TABS tablet Take 1 tablet by mouth daily.   Yes [provider]  OVER THE COUNTER MEDICATION Apply 1 application topically daily as needed (lower back.). hemp freeze gel    Yes [provider]  pantoprazole (PROTONIX) 40 MG tablet Take 1 tablet (40 mg total) by mouth daily. 06/08/19  Yes Janith Lima, MD  torsemide (DEMADEX) 20 MG tablet Take 1 tablet (20 mg  total) by mouth daily. 06/08/19  Yes Janith Lima, MD  traMADol (ULTRAM) 50 MG tablet Take 1-2 tablets (50-100 mg total) by mouth every 12 (twelve) hours as needed for moderate pain. 07/28/19  Yes Constable, Museum/gallery conservator, PA-C  vitamin C (ASCORBIC ACID) 500 MG tablet Take 500 mg by mouth daily.   Yes [provider]  zinc gluconate 50 MG tablet Take 1 tablet (50 mg total) daily by mouth. 10/03/17  Yes Janith Lima, MD  oxyCODONE (OXY IR/ROXICODONE) 5 MG immediate release tablet Take 1-2 tablets (5-10 mg total) by mouth every 4 (four) hours as needed for severe pain. Patient not taking: Reported on 08/04/2019 07/28/19   Ardeen Jourdain, PA-C    Current Facility-Administered Medications  Medication Dose Route Frequency Provider Last Rate Last Dose  . 0.9 %  sodium chloride infusion (Manually program via Guardrails IV Fluids)   Intravenous Once Sheikh, Omair Latif, DO      . 0.9 %  sodium chloride infusion  10 mL/hr Intravenous Once Julianne Rice, MD      . calcium-vitamin D (OSCAL WITH D) 500-200 MG-UNIT per tablet 1 tablet  1 tablet Oral Daily Nita Sells, MD   1 tablet at 08/05/19 1003  . carvedilol (COREG) tablet 12.5 mg  12.5 mg Oral BID WC Nita Sells, MD   12.5 mg at 08/05/19  0755  . cholecalciferol (VITAMIN D3) tablet 1,000 Units  1,000 Units Oral Daily Nita Sells, MD   1,000 Units at 08/05/19 1003  . ferrous sulfate tablet 325 mg  325 mg Oral BID WC Nita Sells, MD   325 mg at 08/05/19 0754  . gabapentin (NEURONTIN) capsule 100 mg  100 mg Oral TID Nita Sells, MD   100 mg at 08/05/19 1002  . methocarbamol (ROBAXIN) tablet 500 mg  500 mg Oral Q8H PRN Nita Sells, MD      . multivitamin with minerals tablet 1 tablet  1 tablet Oral Daily Nita Sells, MD   1 tablet at 08/05/19 0959  . oxyCODONE (Oxy IR/ROXICODONE) immediate release tablet 5-10 mg  5-10 mg Oral Q4H PRN Nita Sells, MD      . pantoprazole (PROTONIX) EC tablet 40 mg  40 mg Oral Daily Nita Sells, MD   40 mg at 08/05/19 0959  . polyvinyl alcohol (LIQUIFILM TEARS) 1.4 % ophthalmic solution 1 drop  1 drop Both Eyes BID Nita Sells, MD   1 drop at 08/05/19 1005  . torsemide (DEMADEX) tablet 20 mg  20 mg Oral Daily Nita Sells, MD   20 mg at 08/05/19 1000  . traMADol (ULTRAM) tablet 50-100 mg  50-100 mg Oral Q12H PRN Nita Sells, MD      . vitamin C (ASCORBIC ACID) tablet 500 mg  500 mg Oral Daily Nita Sells, MD   500 mg at 08/05/19 1003  . zinc sulfate capsule 220 mg  220 mg Oral Daily Nita Sells, MD   220 mg at 08/05/19 1000    Allergies as of 08/04/2019 - Review Complete 08/04/2019  Allergen Reaction Noted  . Sulfonamide derivatives Rash     Family History  Problem Relation Age of Onset  . Ovarian cancer Mother   . Cancer Mother   . Leukemia Father   . Lung cancer Father   . Cancer Father   . Cancer Brother   . Diabetes Neg Hx   . Heart disease Neg Hx   . Hypertension Neg Hx     Social History   Socioeconomic History  .  Marital status: Divorced    Spouse name: Not on file  . Number of children: 1  . Years of education: 82  . Highest education level: Not on file  Occupational  History  . 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 Financial resource strain: Not on file  . Food insecurity    Worry: Not on file    Inability: Not on file  . Transportation needs    Medical: Not on file    Non-medical: Not on file  Tobacco Use  . Smoking status: Never Smoker  . Smokeless tobacco: Never Used  Substance and Sexual Activity  . Alcohol use: No  . Drug use: No  . Sexual activity: Not Currently    Birth control/protection: Surgical  Lifestyle  . Physical activity    Days per week: Not on file    Minutes per session: Not on file  . Stress: Not on file  Relationships  . Social Herbalist on phone: Not on file    Gets together: Not on file    Attends religious service: Not on file    Active member of club or organization: Not on file    Attends meetings of clubs or organizations: Not on file    Relationship status: Not on file  . Intimate partner violence    Fear of current or ex partner: Not on file    Emotionally abused: Not on file    Physically abused: Not on file    Forced sexual activity: Not on file  Other Topics Concern  . Not on file  Social History Narrative   HSG. Married - divorced for many years.  Retired. Lives alone - very active and independent.    Review of Systems: All systems reviewed and negative except where noted in HPI.  Physical Exam: Vital signs in last 24 hours: Temp:  [97.4 F (36.3 C)-98.7 F (37.1 C)] 98.4 F (36.9 C) (09/10 0531) Pulse Rate:  [77-95] 95 (09/10 0531) Resp:  [16-25] 16 (09/10 0531) BP: (128-164)/(42-143) 150/59 (09/10 0531) SpO2:  [94 %-100 %] 94 % (09/10 0531) Weight:  [78.5 kg] 78.5 kg (09/09 2059) Last BM Date: 08/03/19 General:   Alert, well-developed,  female in NAD Psych:  Pleasant, cooperative. Normal mood and affect. Eyes:  Pupils equal, sclera clear, no icterus.   Conjunctiva pink. Ears:  Normal auditory acuity. Nose:  No deformity,  discharge,  or lesions. Neck:  Supple; no masses Lungs:  Clear throughout to auscultation.   No wheezes, crackles, or rhonchi.  Heart:  Regular rate and rhythm;  no lower extremity edema Abdomen:  Soft, non-distended, nontender, BS active, no palp mass Rectal:  Deferred  Msk:  Symmetrical without gross deformities. . Neurologic:  Alert and  oriented x4;  grossly normal neurologically. Skin:  Intact without significant lesions or rashes.   Intake/Output from previous day: 09/09 0701 - 09/10 0700 In: 672 [P.O.:120; Blood:552] Out: -  Intake/Output this shift: Total I/O In: 240 [P.O.:240] Out: -   Lab Results: Recent Labs    08/04/19 1054 08/04/19 1408 08/04/19 2117 08/05/19 0246  WBC 9.8 9.3  --  8.4  HGB 7.2 Repeated and verified X2.* 6.9* 7.6* 7.7*  HCT 20.9 Repeated and verified X2.* 21.9* 24.1* 23.6*  PLT 291.0 273  --  257   BMET Recent Labs    08/04/19 1054 08/04/19 1408 08/05/19 0246  NA 141 141 139  K 3.8  3.8 3.9  CL 106 105 108  CO2 26 25 22   GLUCOSE 140* 100* 108*  BUN 25* 25* 23  CREATININE 1.51* 1.50* 1.27*  CALCIUM 8.8 8.9 8.1*   LFT Recent Labs    08/04/19 1054  08/05/19 0246  PROT 6.1   < > 5.9*  ALBUMIN 3.8   < > 3.6  AST 12   < > 17  ALT 9   < > 11  ALKPHOS 65   < > 63  BILITOT 2.0*   < > 2.6*  BILIDIR 0.4*  --   --    < > = values in this interval not displayed.   PT/INR No results for input(s): LABPROT, INR in the last 72 hours. Hepatitis Panel No results for input(s): HEPBSAG, HCVAB, HEPAIGM, HEPBIGM in the last 72 hours.   . CBC Latest Ref Rng & Units 08/05/2019 08/04/2019 08/04/2019  WBC 4.0 - 10.5 K/uL 8.4 - 9.3  Hemoglobin 12.0 - 15.0 g/dL 7.7(L) 7.6(L) 6.9(LL)  Hematocrit 36.0 - 46.0 % 23.6(L) 24.1(L) 21.9(L)  Platelets 150 - 400 K/uL 257 - 273    . CMP Latest Ref Rng & Units 08/05/2019 08/04/2019 08/04/2019  Glucose 70 - 99 mg/dL 108(H) 100(H) 140(H)  BUN 8 - 23 mg/dL 23 25(H) 25(H)  Creatinine 0.44 - 1.00 mg/dL 1.27(H)  1.50(H) 1.51(H)  Sodium 135 - 145 mmol/L 139 141 141  Potassium 3.5 - 5.1 mmol/L 3.9 3.8 3.8  Chloride 98 - 111 mmol/L 108 105 106  CO2 22 - 32 mmol/L 22 25 26   Calcium 8.9 - 10.3 mg/dL 8.1(L) 8.9 8.8  Total Protein 6.5 - 8.1 g/dL 5.9(L) 6.4(L) 6.1  Total Bilirubin 0.3 - 1.2 mg/dL 2.6(H) 2.0(H) 2.0(H)  Alkaline Phos 38 - 126 U/L 63 66 65  AST 15 - 41 U/L 17 15 12   ALT 0 - 44 U/L 11 13 9    Studies/Results: Vas [place] Lower Extremity Venous (dvt) (only [facility])  Result Date: 08/04/2019  Lower Venous Study Indications: Pain, Swelling, and total left knee replacement 07/26/2019.  Limitations: Body habitus and poor ultrasound/tissue interface. Comparison Study: No recent prior study. Performing Technologist: Maudry Mayhew MHA, RDMS, RVT, RDCS  Examination Guidelines: A complete evaluation includes B-mode imaging, spectral Doppler, color Doppler, and power Doppler as needed of all accessible portions of each vessel. Bilateral testing is considered an integral part of a complete examination. Limited examinations for reoccurring indications may be performed as noted.  +---------+---------------+---------+-----------+----------+--------------+ LEFT     CompressibilityPhasicitySpontaneityPropertiesThrombus Aging +---------+---------------+---------+-----------+----------+--------------+ CFV      Full           Yes      Yes                                 +---------+---------------+---------+-----------+----------+--------------+ SFJ      Full                                                        +---------+---------------+---------+-----------+----------+--------------+ FV Prox  Full                                                        +---------+---------------+---------+-----------+----------+--------------+  FV Mid   Full                                                        +---------+---------------+---------+-----------+----------+--------------+ FV DistalFull                                                         +---------+---------------+---------+-----------+----------+--------------+ PFV      Full                                                        +---------+---------------+---------+-----------+----------+--------------+ POP      Full           Yes      Yes                                 +---------+---------------+---------+-----------+----------+--------------+ PTV      Full                                                        +---------+---------------+---------+-----------+----------+--------------+ PERO     Full                                                        +---------+---------------+---------+-----------+----------+--------------+ Gastroc  None                    No                   Acute          +---------+---------------+---------+-----------+----------+--------------+  Unable to visualize right common femoral vein due to patient position.  Summary: Left: Findings consistent with acute deep vein thrombosis involving the left gastrocnemius veins. No cystic structure found in the popliteal fossa.  *See table(s) above for measurements and observations. Electronically signed by Curt Jews MD on 08/04/2019 at 4:46:26 PM.    Final     Active Problems:   Anemia    Tye Savoy, NP-C @  08/05/2019, 11:15 AM

## 2019-08-05 NOTE — Anesthesia Preprocedure Evaluation (Addendum)
Anesthesia Evaluation  Patient identified by MRN, date of birth, ID band Patient awake    Reviewed: Allergy & Precautions, NPO status , Patient's Chart, lab work & pertinent test results  History of Anesthesia Complications (+) PONV and history of anesthetic complications  Airway Mallampati: II  TM Distance: >3 FB Neck ROM: Full    Dental no notable dental hx.    Pulmonary neg pulmonary ROS,    Pulmonary exam normal breath sounds clear to auscultation       Cardiovascular hypertension, Pt. on home beta blockers + Peripheral Vascular Disease  Normal cardiovascular exam+ Valvular Problems/Murmurs  Rhythm:Regular Rate:Normal  ECG: SR, rate 77   Neuro/Psych negative neurological ROS  negative psych ROS   GI/Hepatic Neg liver ROS, GERD  Medicated and Controlled,  Endo/Other  negative endocrine ROS  Renal/GU negative Renal ROS     Musculoskeletal negative musculoskeletal ROS (+)   Abdominal (+) + obese,   Peds  Hematology  (+) anemia ,   Anesthesia Other Findings upper gi bleed  Reproductive/Obstetrics                            Anesthesia Physical Anesthesia Plan  ASA: III  Anesthesia Plan: MAC   Post-op Pain Management:    Induction: Intravenous  PONV Risk Score and Plan: 2 and Propofol infusion  Airway Management Planned: Nasal Cannula  Additional Equipment:   Intra-op Plan:   Post-operative Plan:   Informed Consent: I have reviewed the patients History and Physical, chart, labs and discussed the procedure including the risks, benefits and alternatives for the proposed anesthesia with the patient or authorized representative who has indicated his/her understanding and acceptance.     Dental advisory given  Plan Discussed with: CRNA  Anesthesia Plan Comments:        Anesthesia Quick Evaluation

## 2019-08-05 NOTE — Evaluation (Signed)
Physical Therapy Evaluation Patient Details Name: Claudia Lara MRN: [id number] DOB: 1932-01-09 Today's Date: 08/05/2019   History of Present Illness  85 yo female s/p L TKA on 8/31, d/c 9/2. Pt admitted to ED 9/9 for anemia, DVT L gastrocnemius veins not requiring anticoagulant protocol; PMH: R TKA, HTN, PVD, R posterior THA  Clinical Impression   Pt presents with mild L knee pain and ROM deficits s/p TKA 8/31, increased time and effort to mobilize, and decreased activity tolerance. Pt to benefit from acute PT to address deficits. Pt ambulated hallway distance with RW with supervision level assist, per RN pt has been mobilizing to and from the bathroom multiple times today without difficulty. Pt recommending return to HHPT progressing OPPT plan, pending pt's medical improvement. PT to progress mobility as tolerated, and will continue to follow acutely.      Follow Up Recommendations Follow surgeon's recommendation for DC plan and follow-up therapies(d/c home with HHPT, OPPT scheduled for Monday 9/14)    Equipment Recommendations  None recommended by PT    Recommendations for Other Services       Precautions / Restrictions Precautions Precautions: Fall Precaution Comments: Pt with superficial DVT of gastrocnemius veins. Per chart review in Dr. Weston Settle and Dr. Arlyss Queen notes, DVT is low risk for PE and will not be treated at this time. Dr. Weston Settle note on 9/10 states, "Given the superficial DVT and Dr. Arlyss Queen discussion with Hematology Dr. Alen Blew we will not anticoagulate currently as she is also having a suspected GI bleed; patient is a high risk patient however Dr. Alen Blew believes that this patient has a low risk for embolization and recommends this continued monitoring". Restrictions Weight Bearing Restrictions: No Other Position/Activity Restrictions: WBAT      Mobility  Bed Mobility Overal bed mobility: Needs Assistance Bed Mobility: Supine to Sit     Supine to sit:  Supervision;HOB elevated     General bed mobility comments: supervision for safety  Transfers Overall transfer level: Needs assistance Equipment used: Rolling walker (2 wheeled) Transfers: Sit to/from Stand Sit to Stand: Supervision         General transfer comment: supervision for safety, no VC required  Ambulation/Gait Ambulation/Gait assistance: Supervision Gait Distance (Feet): 200 Feet Assistive device: Rolling walker (2 wheeled) Gait Pattern/deviations: Step-through pattern;Trunk flexed Gait velocity: decr   General Gait Details: supervision for safety, verbal cuing for placement in RW with heel-toe ambulation.  Stairs            Wheelchair Mobility    Modified Rankin (Stroke Patients Only)       Balance Overall balance assessment: Mild deficits observed, not formally tested                                           Pertinent Vitals/Pain Pain Assessment: 0-10 Pain Score: 2  Pain Location: left knee Pain Descriptors / Indicators: Sore Pain Intervention(s): Limited activity within patient's tolerance;Monitored during session;Premedicated before session;Repositioned    Home Living Family/patient expects to be discharged to:: Private residence Living Arrangements: Alone   Type of Home: House Home Access: Stairs to enter Entrance Stairs-Rails: Can reach both Entrance Stairs-Number of Steps: 2 Home Layout: One level Home Equipment: Walker - 2 wheels;Bedside commode;Shower seat      Prior Function Level of Independence: Independent         Comments: Pt has been using Rw since surgery,  HHPT worked with pt x2     Hand Dominance   Dominant Hand: Right    Extremity/Trunk Assessment   Upper Extremity Assessment Upper Extremity Assessment: Overall WFL for tasks assessed    Lower Extremity Assessment Lower Extremity Assessment: Overall WFL for tasks assessed;LLE deficits/detail LLE Deficits / Details: knee AAROM 10-75*     Cervical / Trunk Assessment Cervical / Trunk Assessment: Normal  Communication   Communication: No difficulties;HOH  Cognition Arousal/Alertness: Awake/alert Behavior During Therapy: WFL for tasks assessed/performed Overall Cognitive Status: Within Functional Limits for tasks assessed                                        General Comments      Exercises     Assessment/Plan    PT Assessment Patient needs continued PT services  PT Problem List Decreased strength;Decreased range of motion;Decreased activity tolerance;Decreased mobility;Pain;Decreased knowledge of use of DME;Decreased knowledge of precautions       PT Treatment Interventions DME instruction;Gait training;Functional mobility training;Therapeutic activities;Patient/family education;Therapeutic exercise;Stair training    PT Goals (Current goals can be found in the Care Plan section)  Acute Rehab PT Goals Patient Stated Goal: go home PT Goal Formulation: With patient Time For Goal Achievement: 08/12/19 Potential to Achieve Goals: Good    Frequency Min 3X/week   Barriers to discharge        Co-evaluation               AM-PAC PT "6 Clicks" Mobility  Outcome Measure Help needed turning from your back to your side while in a flat bed without using bedrails?: None Help needed moving from lying on your back to sitting on the side of a flat bed without using bedrails?: None Help needed moving to and from a bed to a chair (including a wheelchair)?: None Help needed standing up from a chair using your arms (e.g., wheelchair or bedside chair)?: None Help needed to walk in hospital room?: A Little Help needed climbing 3-5 steps with a railing? : A Little 6 Click Score: 22    End of Session Equipment Utilized During Treatment: Gait belt Activity Tolerance: Patient tolerated treatment well Patient left: in chair;with chair alarm set;with call bell/phone within reach Nurse Communication:  Mobility status PT Visit Diagnosis: Difficulty in walking, not elsewhere classified (R26.2)    Time: [id number] PT Time Calculation (min) (ACUTE ONLY): 18 min   Charges:   PT Evaluation $PT Eval Low Complexity: 1 Low         Julien Girt, PT Acute Rehabilitation Services Pager [phone]  Office [phone]   Jef Futch D Omarr Hann 08/05/2019, 7:17 PM

## 2019-08-05 NOTE — Progress Notes (Signed)
PROGRESS NOTE    Claudia Lara  [id number] DOB: 1932-09-19 DOA: 08/04/2019 PCP: Janith Lima, MD   Brief Narrative:  HPI per Dr. Nita Sells on 08/04/2019 Very pleasant 85 year old white female Recent admit 831 through 07/28/2019 with elective left knee osteoarthritis-this was performed by surgeon Narda Bonds and patient was discharged and underwent therapy at home Patient states that she was feeling swollen and fatigued post surgery and when her therapist came out her blood pressure was found to be low so she called her orthopedic surgeon who prompted her to go to her regular doctor-her regular doctor did blood work and told her to come to the ED because her hemoglobin was found to be low it is unclear what it was Patient has been taking aspirin 2 tablets a day since surgery She has sometimes dark stool because she is on iron and in the past has been seen by gastroenterology Dr. Ardis Hughs and had a polypectomy in the remote past  Assessment & Plan:   Active Problems:   Anemia   Acute Symptomatic Anemia -Likely secondary to blood loss in addition to use of her aspirin 325 mg p.o. twice daily for DVT prophylaxis in the setting of her recent knee surgery -Patient has been having some darker stools but she is on IV iron -FOBT was Negative  -She was typed and screened and transfused 1 unit of PRBCs yesterday with minimal improvement and will transfuse another 1 unit -Gastroenterology consulted for further evaluation recommendations and recommending an EGD to initially evaluate and recommending IV Protonix 40 mg every 12 hours while inpatient holding all aspirin and NSAIDs currently -Baseline hemoglobin is around 10 -Anemia panel done and showed an iron level of 78, saturation ratios of 37.1, ferritin level 484.4, transferrin 150.0, folate level greater than 24.7, and vitamin B12 level of 349 -On presentation her hemoglobin/hematocrit was 7.2/20.9 and then further dropped to 6.9/21.9  and transfuse 1 unit of PRBCs and improved to 7.7/20.6 and will give additional 1 unit -Monitor for signs and symptoms of bleeding -We will continue with ferrous sulfate 3 2 5  mg p.o. twice daily for now -Repeat CBC in a.m.  Recent Left Knee Surgery  -Done by Dr. Gaynelle Arabian on 07/26/2019 -Continue with tramadol 50-100 mg p.o. every 12 PRN for moderate pain along with oxycodone 5 to 10 mg p.o. every 4 PRN for severe pain -Continue methocarbamol 500 mg p.o. every 8 as needed muscle spasm -Also continue with gabapentin 100 g p.o. 3 times daily   CKD stage III -Patient presented with a BUN/creatinine of 25/1.51 and is now improved to 23/1.27 after 1 unit of pRBC -Avoid nephrotoxic medications, contrast dyes as well as hypotension possible -Will continue Torsemide 20 mg po Daily  -Repeat CMP in a.m.  GERD -Continue with IV pantoprazole 40 mg every 12  Left leg edema in the setting of gastrocnemius DVT in setting of recent total knee arthroplasty on 07/26/2019 -LE Venous Duplex showed "Findings consistent with acute deep vein thrombosis involving the left gastrocnemius veins. No cystic structure found in the popliteal fossa." -Given the superficial DVT and Dr. Arlyss Queen discussion with Hematology Dr. Alen Blew we will not anticoagulate currently as she is also having a suspected GI bleed; patient is a high risk patient however Dr. Alen Blew believes that this patient has a low risk for embolization and recommends this continued monitoring -We will recommend serial venous duplexes in outpatient setting to monitor -Continue to hold aspirin 325 mg p.o. twice daily -May  consider anticoagulation based on GIs finding on her EGD tomorrow now we will defer this  Chronic Diastolic CHF HTN -C/w Carvedilol 12.5 mg po BID and Torsemide 20 mg po Daily -Last ECHO was on 02/18/2018; Needs to have this repeated as an outpatient   Pulmonary Nodule -Noted on CT Scan on 06/18/2019 and has enlarged to 9 mm -Needs  to follow up with Dr. Vaughan Browner as an outpatient   Hypermagnesemia -Mild and was 2.5 -Continue to Monitor and Repeat CMP in AM   Obesity -Estimated body mass index is 30.66 kg/m as calculated from the following:   Height as of this encounter: 5\' 3"  (1.6 m).   Weight as of this encounter: 78.5 kg. -Weight Loss and Dietary Counseling given   DVT prophylaxis: SCDs Code Status: FULL CODE  Family Communication: No family present at bedside  Disposition Plan: Pending further work-up by gastroenterology and EGD  Consultants:   Gastroenterology    Procedures: None but EGD in AM    Antimicrobials:  Anti-infectives (From admission, onward)   None     Subjective: Seen and examined at bedside and she states that she is feeling better.  Denies any nausea or vomiting.  Denies any chest pain, lightheadedness or dizziness.  States that her left leg started swelling after her surgery.  No other concerns or complaints at this time and states that she had a bowel movement this morning but did not save it and states that her bowel movements have been dark but now been looser.  Objective: Vitals:   08/05/19 1123 08/05/19 1145 08/05/19 1300 08/05/19 1431  BP: 137/60 (!) 134/54 (!) 131/52 (!) 135/46  Pulse: 75 76 79 81  Resp: 16 16  16   Temp: 98.7 F (37.1 C) 98.5 F (36.9 C) 98.2 F (36.8 C) 97.7 F (36.5 C)  TempSrc: Oral Oral Oral Oral  SpO2: 98% 98% 98% 98%  Weight:      Height:        Intake/Output Summary (Last 24 hours) at 08/05/2019 1543 Last data filed at 08/05/2019 1431 Gross per 24 hour  Intake 1343.25 ml  Output -  Net 1343.25 ml   Filed Weights   08/04/19 2059  Weight: 78.5 kg   Examination: Physical Exam:  Constitutional: WN/WD obese elderly Caucasian female in NAD and appears calm Eyes: Lids and conjunctivae normal, sclerae anicteric  ENMT: External Ears, Nose appear normal. Grossly normal hearing. Mucous membranes are moist.  Neck: Appears normal, supple, no  cervical masses, normal ROM, no appreciable thyromegaly; no JVD Respiratory: Diminished to auscultation bilaterally, no wheezing, rales, rhonchi or crackles. Normal respiratory effort and patient is not tachypenic. No accessory muscle use.  Cardiovascular: RRR, no murmurs / rubs / gallops. S1 and S2 auscultated. Left Leg is more edematous and swollen than Right leg Abdomen: Soft, non-tender, Distended due to body habitus. No masses palpated. No appreciable hepatosplenomegaly. Bowel sounds positive x4.  GU: Deferred. Musculoskeletal: No clubbing / cyanosis of digits/nails. Normal strength and muscle tone.  Skin: No rashes, lesions, ulcers on a limited skin evaluation. No induration; Warm and dry.  Neurologic: CN 2-12 grossly intact with no focal deficits. Romberg sign and cerebellar reflexes not assessed.  Psychiatric: Normal judgment and insight. Alert and oriented x 3. Normal mood and appropriate affect.   Data Reviewed: I have personally reviewed following labs and imaging studies  CBC: Recent Labs  Lab 08/04/19 1054 08/04/19 1408 08/04/19 2117 08/05/19 0246  WBC 9.8 9.3  --  8.4  NEUTROABS  7.4 6.5  --   --   HGB 7.2 Repeated and verified X2.* 6.9* 7.6* 7.7*  HCT 20.9 Repeated and verified X2.* 21.9* 24.1* 23.6*  MCV 91.8 97.8  --  95.9  PLT 291.0 273  --  [id number]   Basic Metabolic Panel: Recent Labs  Lab 08/04/19 1054 08/04/19 1408 08/05/19 0246  NA 141 141 139  K 3.8 3.8 3.9  CL 106 105 108  CO2 26 25 22   GLUCOSE 140* 100* 108*  BUN 25* 25* 23  CREATININE 1.51* 1.50* 1.27*  CALCIUM 8.8 8.9 8.1*  MG  --   --  2.5*  PHOS  --   --  4.0   GFR: Estimated Creatinine Clearance: 32.1 mL/min (A) (by C-G formula based on SCr of 1.27 mg/dL (H)). Liver Function Tests: Recent Labs  Lab 08/04/19 1054 08/04/19 1408 08/05/19 0246  AST 12 15 17   ALT 9 13 11   ALKPHOS 65 66 63  BILITOT 2.0* 2.0* 2.6*  PROT 6.1 6.4* 5.9*  ALBUMIN 3.8 3.9 3.6   No results for input(s): LIPASE,  AMYLASE in the last 168 hours. No results for input(s): AMMONIA in the last 168 hours. Coagulation Profile: No results for input(s): INR, PROTIME in the last 168 hours. Cardiac Enzymes: No results for input(s): CKTOTAL, CKMB, CKMBINDEX, TROPONINI in the last 168 hours. BNP (last 3 results) Recent Labs    08/04/19 1054  PROBNP 305.0*   HbA1C: No results for input(s): HGBA1C in the last 72 hours. CBG: No results for input(s): GLUCAP in the last 168 hours. Lipid Profile: No results for input(s): CHOL, HDL, LDLCALC, TRIG, CHOLHDL, LDLDIRECT in the last 72 hours. Thyroid Function Tests: Recent Labs    08/04/19 1054  TSH 1.12   Anemia Panel: Recent Labs    08/04/19 1054  VITAMINB12 349  FOLATE >24.7  FERRITIN 484.4*  IRON 78  RETICCTPCT 7.3   Sepsis Labs: No results for input(s): PROCALCITON, LATICACIDVEN in the last 168 hours.  Recent Results (from the past 240 hour(s))  SARS Coronavirus 2 [facility] order, Performed in [facility] hospital lab) Nasopharyngeal Nasopharyngeal Swab     Status: None   Collection Time: 08/04/19  2:09 PM   Specimen: Nasopharyngeal Swab  Result Value Ref Range Status   SARS Coronavirus 2 NEGATIVE NEGATIVE Final    Comment: (NOTE) If result is NEGATIVE SARS-CoV-2 target nucleic acids are NOT DETECTED. The SARS-CoV-2 RNA is generally detectable in upper and lower  respiratory specimens during the acute phase of infection. The lowest  concentration of SARS-CoV-2 viral copies this assay can detect is 250  copies / mL. A negative result does not preclude SARS-CoV-2 infection  and should not be used as the sole basis for treatment or other  patient management decisions.  A negative result may occur with  improper specimen collection / handling, submission of specimen other  than nasopharyngeal swab, presence of viral mutation(s) within the  areas targeted by this assay, and inadequate number of viral copies  (<250 copies / mL). A negative result  must be combined with clinical  observations, patient history, and epidemiological information. If result is POSITIVE SARS-CoV-2 target nucleic acids are DETECTED. The SARS-CoV-2 RNA is generally detectable in upper and lower  respiratory specimens dur ing the acute phase of infection.  Positive  results are indicative of active infection with SARS-CoV-2.  Clinical  correlation with patient history and other diagnostic information is  necessary to determine patient infection status.  Positive results do  not rule out bacterial infection or co-infection with other viruses. If result is PRESUMPTIVE POSTIVE SARS-CoV-2 nucleic acids MAY BE PRESENT.   A presumptive positive result was obtained on the submitted specimen  and confirmed on repeat testing.  While 2019 novel coronavirus  (SARS-CoV-2) nucleic acids may be present in the submitted sample  additional confirmatory testing may be necessary for epidemiological  and / or clinical management purposes  to differentiate between  SARS-CoV-2 and other Sarbecovirus currently known to infect humans.  If clinically indicated additional testing with an alternate test  methodology [phone]) is advised. The SARS-CoV-2 RNA is generally  detectable in upper and lower respiratory sp ecimens during the acute  phase of infection. The expected result is Negative. Fact Sheet for Patients:  StrictlyIdeas.no Fact Sheet for Healthcare Providers: BankingDealers.co.za This test is not yet approved or cleared by the [place] FDA and has been authorized for detection and/or diagnosis of SARS-CoV-2 by FDA under an Emergency Use Authorization (EUA).  This EUA will remain in effect (meaning this test can be used) for the duration of the COVID-19 declaration under Section 564(b)(1) of the Act, 21 U.S.C. section 360bbb-3(b)(1), unless the authorization is terminated or revoked sooner. Performed at [facility], [place] [address] [place], [place] [address]     Radiology Studies: Vas [place] Lower Extremity Venous (dvt) (only [place])  Result Date: 08/04/2019  Lower Venous Study Indications: Pain, Swelling, and total left knee replacement 07/26/2019.  Limitations: Body habitus and poor ultrasound/tissue interface. Comparison Study: No recent prior study. Performing Technologist: Maudry Mayhew MHA, RDMS, RVT, RDCS  Examination Guidelines: A complete evaluation includes B-mode imaging, spectral Doppler, color Doppler, and power Doppler as needed of all accessible portions of each vessel. Bilateral testing is considered an integral part of a complete examination. Limited examinations for reoccurring indications may be performed as noted.  +---------+---------------+---------+-----------+----------+--------------+ LEFT     CompressibilityPhasicitySpontaneityPropertiesThrombus Aging +---------+---------------+---------+-----------+----------+--------------+ CFV      Full           Yes      Yes                                 +---------+---------------+---------+-----------+----------+--------------+ SFJ      Full                                                        +---------+---------------+---------+-----------+----------+--------------+ FV Prox  Full                                                        +---------+---------------+---------+-----------+----------+--------------+ FV Mid   Full                                                        +---------+---------------+---------+-----------+----------+--------------+ FV DistalFull                                                        +---------+---------------+---------+-----------+----------+--------------+  PFV      Full                                                        +---------+---------------+---------+-----------+----------+--------------+ POP      Full           Yes      Yes                                  +---------+---------------+---------+-----------+----------+--------------+ PTV      Full                                                        +---------+---------------+---------+-----------+----------+--------------+ PERO     Full                                                        +---------+---------------+---------+-----------+----------+--------------+ Gastroc  None                    No                   Acute          +---------+---------------+---------+-----------+----------+--------------+  Unable to visualize right common femoral vein due to patient position.  Summary: Left: Findings consistent with acute deep vein thrombosis involving the left gastrocnemius veins. No cystic structure found in the popliteal fossa.  *See table(s) above for measurements and observations. Electronically signed by Curt Jews MD on 08/04/2019 at 4:46:26 PM.    Final    Scheduled Meds: . sodium chloride   Intravenous Once  . calcium-vitamin D  1 tablet Oral Daily  . carvedilol  12.5 mg Oral BID WC  . cholecalciferol  1,000 Units Oral Daily  . ferrous sulfate  325 mg Oral BID WC  . gabapentin  100 mg Oral TID  . multivitamin with minerals  1 tablet Oral Daily  . pantoprazole  40 mg Oral BID  . polyvinyl alcohol  1 drop Both Eyes BID  . torsemide  20 mg Oral Daily  . vitamin C  500 mg Oral Daily  . zinc sulfate  220 mg Oral Daily   Continuous Infusions: . sodium chloride      LOS: 0 days   Kerney Elbe, DO [facility] PAGER is on AMION  If 7PM-7AM, please contact night-coverage [email or web address] Password [facility] 08/05/2019, 3:43 PM

## 2019-08-06 ENCOUNTER — Encounter (HOSPITAL_COMMUNITY): Payer: Self-pay | Admitting: *Deleted

## 2019-08-06 ENCOUNTER — Encounter (HOSPITAL_COMMUNITY)
Admission: EM | Disposition: A | Discharge status: Home Health | Payer: Self-pay | Source: Home / Self Care | Attending: Internal Medicine

## 2019-08-06 ENCOUNTER — Inpatient Hospital Stay (HOSPITAL_COMMUNITY): Payer: Medicare Other | Admitting: Anesthesiology

## 2019-08-06 DIAGNOSIS — K317 Polyp of stomach and duodenum: Secondary | ICD-10-CM

## 2019-08-06 DIAGNOSIS — K254 Chronic or unspecified gastric ulcer with hemorrhage: Principal | ICD-10-CM

## 2019-08-06 DIAGNOSIS — K253 Acute gastric ulcer without hemorrhage or perforation: Secondary | ICD-10-CM

## 2019-08-06 HISTORY — PX: ESOPHAGOGASTRODUODENOSCOPY (EGD) WITH PROPOFOL: SHX5813

## 2019-08-06 HISTORY — PX: BIOPSY: SHX5522

## 2019-08-06 LAB — CBC WITH DIFFERENTIAL/PLATELET
Abs Immature Granulocytes: 0.16 10*3/uL — ABNORMAL HIGH (ref 0.00–0.07)
Basophils Absolute: 0 10*3/uL (ref 0.0–0.1)
Basophils Relative: 1 %
Eosinophils Absolute: 0.3 10*3/uL (ref 0.0–0.5)
Eosinophils Relative: 4 %
HCT: 26.5 % — ABNORMAL LOW (ref 36.0–46.0)
Hemoglobin: 8.8 g/dL — ABNORMAL LOW (ref 12.0–15.0)
Immature Granulocytes: 2 %
Lymphocytes Relative: 17 %
Lymphs Abs: 1.4 10*3/uL (ref 0.7–4.0)
MCH: 30.9 pg (ref 26.0–34.0)
MCHC: 33.2 g/dL (ref 30.0–36.0)
MCV: 93 fL (ref 80.0–100.0)
Monocytes Absolute: 0.7 10*3/uL (ref 0.1–1.0)
Monocytes Relative: 9 %
Neutro Abs: 5.5 10*3/uL (ref 1.7–7.7)
Neutrophils Relative %: 67 %
Platelets: 244 10*3/uL (ref 150–400)
RBC: 2.85 MIL/uL — ABNORMAL LOW (ref 3.87–5.11)
RDW: 19.1 % — ABNORMAL HIGH (ref 11.5–15.5)
WBC: 8.1 10*3/uL (ref 4.0–10.5)
nRBC: 0.4 % — ABNORMAL HIGH (ref 0.0–0.2)

## 2019-08-06 LAB — COMPREHENSIVE METABOLIC PANEL
ALT: 11 U/L (ref 0–44)
AST: 14 U/L — ABNORMAL LOW (ref 15–41)
Albumin: 3.5 g/dL (ref 3.5–5.0)
Alkaline Phosphatase: 60 U/L (ref 38–126)
Anion gap: 12 (ref 5–15)
BUN: 22 mg/dL (ref 8–23)
CO2: 25 mmol/L (ref 22–32)
Calcium: 8.7 mg/dL — ABNORMAL LOW (ref 8.9–10.3)
Chloride: 103 mmol/L (ref 98–111)
Creatinine, Ser: 1.44 mg/dL — ABNORMAL HIGH (ref 0.44–1.00)
GFR calc Af Amer: 38 mL/min — ABNORMAL LOW (ref >60)
GFR calc non Af Amer: 33 mL/min — ABNORMAL LOW (ref >60)
Glucose, Bld: 118 mg/dL — ABNORMAL HIGH (ref 70–99)
Potassium: 3.6 mmol/L (ref 3.5–5.1)
Sodium: 140 mmol/L (ref 135–145)
Total Bilirubin: 2.7 mg/dL — ABNORMAL HIGH (ref 0.3–1.2)
Total Protein: 5.9 g/dL — ABNORMAL LOW (ref 6.5–8.1)

## 2019-08-06 LAB — BPAM RBC
Blood Product Expiration Date: 202009302359
Blood Product Expiration Date: 202009302359
ISSUE DATE / TIME: 202009091549
ISSUE DATE / TIME: 202009101115
Unit Type and Rh: 6200
Unit Type and Rh: 6200

## 2019-08-06 LAB — TYPE AND SCREEN
ABO/RH(D): A POS
Antibody Screen: NEGATIVE
Unit division: 0
Unit division: 0

## 2019-08-06 LAB — PHOSPHORUS: Phosphorus: 4.2 mg/dL (ref 2.5–4.6)

## 2019-08-06 LAB — MAGNESIUM: Magnesium: 2.4 mg/dL (ref 1.7–2.4)

## 2019-08-06 SURGERY — ESOPHAGOGASTRODUODENOSCOPY (EGD) WITH PROPOFOL
Anesthesia: Monitor Anesthesia Care

## 2019-08-06 MED ORDER — PROPOFOL 500 MG/50ML IV EMUL
INTRAVENOUS | Status: DC | PRN
  Administered 2019-08-06: 125 ug/kg/min via INTRAVENOUS

## 2019-08-06 MED ORDER — ONDANSETRON HCL 4 MG/2ML IJ SOLN
INTRAMUSCULAR | Status: DC | PRN
  Administered 2019-08-06: 4 mg via INTRAVENOUS

## 2019-08-06 MED ORDER — LACTATED RINGERS IV SOLN
INTRAVENOUS | Status: DC
  Administered 2019-08-06: 1000 mL via INTRAVENOUS

## 2019-08-06 MED ORDER — PANTOPRAZOLE SODIUM 40 MG PO TBEC: 40.0000 mg | DELAYED_RELEASE_TABLET | Freq: Every day | ORAL | Status: DC

## 2019-08-06 MED ORDER — PROPOFOL 10 MG/ML IV BOLUS
INTRAVENOUS | Status: AC
  Filled 2019-08-06: qty 40

## 2019-08-06 MED ORDER — ASPIRIN 325 MG PO TBEC: 325.0000 mg | DELAYED_RELEASE_TABLET | Freq: Every day | ORAL | 0 refills | Status: DC

## 2019-08-06 MED ORDER — PROPOFOL 10 MG/ML IV BOLUS
INTRAVENOUS | Status: DC | PRN
  Administered 2019-08-06: 30 mg via INTRAVENOUS
  Administered 2019-08-06: 20 mg via INTRAVENOUS

## 2019-08-06 SURGICAL SUPPLY — 15 items

## 2019-08-06 NOTE — Transfer of Care (Signed)
Immediate Anesthesia Transfer of Care Note  Patient: Claudia Lara  Procedure(s) Performed: Procedure(s): ESOPHAGOGASTRODUODENOSCOPY (EGD) WITH PROPOFOL (N/A) BIOPSY  Patient Location: PACU  Anesthesia Type:MAC  Level of Consciousness:  sedated, patient cooperative and responds to stimulation  Airway & Oxygen Therapy:Patient Spontanous Breathing and Patient connected to face mask oxgen  Post-op Assessment:  Report given to PACU RN and Post -op Vital signs reviewed and stable  Post vital signs:  Reviewed and stable  Last Vitals:  Vitals:   08/06/19 0722 08/06/19 0845  BP: (!) 164/51 (!) 112/42  Pulse: 75 77  Resp: 20 20  Temp: 37.1 C 37.2 C  SpO2: 36% 64%    Complications: No apparent anesthesia complications

## 2019-08-06 NOTE — Progress Notes (Signed)
OT Cancellation Note  Patient Details Name: Claudia Lara MRN: [id number] DOB: 1932/07/07   Cancelled Treatment:    Reason Eval/Treat Not Completed: OT screened, no needs identified, will sign off. Pt has been managing on her own since sx and doesn't feel she has any OT needs.  Smriti Barkow 08/06/2019, 12:22 PM  Lesle Chris, OTR/L Acute Rehabilitation Services [phone] [facility] pager [phone] office 08/06/2019

## 2019-08-06 NOTE — Anesthesia Postprocedure Evaluation (Signed)
Anesthesia Post Note  Patient: Claudia Lara  Procedure(s) Performed: ESOPHAGOGASTRODUODENOSCOPY (EGD) WITH PROPOFOL (N/A ) BIOPSY     Patient location during evaluation: PACU Anesthesia Type: MAC Level of consciousness: awake Pain management: pain level controlled Vital Signs Assessment: post-procedure vital signs reviewed and stable Respiratory status: spontaneous breathing, nonlabored ventilation, respiratory function stable and patient connected to nasal cannula oxygen Cardiovascular status: stable and blood pressure returned to baseline Postop Assessment: no apparent nausea or vomiting Anesthetic complications: no    Last Vitals:  Vitals:   08/06/19 0916 08/06/19 0931  BP:  (!) 136/57  Pulse: 73 70  Resp: (!) 22 20  Temp:  36.7 C  SpO2: 94% 94%    Last Pain:  Vitals:   08/06/19 0931  TempSrc: Oral  PainSc:                  Ryan P Ellender

## 2019-08-06 NOTE — Interval H&P Note (Signed)
History and Physical Interval Note:  08/06/2019 8:07 AM  Claudia Lara  has presented today for surgery, with the diagnosis of upper gi bleed.  The various methods of treatment have been discussed with the patient and family. After consideration of risks, benefits and other options for treatment, the patient has consented to  Procedure(s): ESOPHAGOGASTRODUODENOSCOPY (EGD) WITH PROPOFOL (N/A) as a surgical intervention.  The patient's history has been reviewed, patient examined, no change in status, stable for surgery.  I have reviewed the patient's chart and labs.  Questions were answered to the patient's satisfaction.     Silvano Rusk

## 2019-08-06 NOTE — Discharge Summary (Signed)
Physician Discharge Summary  Claudia Lara [id number] DOB: 1931/11/28 DOA: 08/04/2019  PCP: Janith Lima, MD  Admit date: 08/04/2019 Discharge date: 08/06/2019  Admitted From: Home Disposition: Home with [facility] progressting to OPPT plan  Recommendations for Outpatient Follow-up:  1. Follow up with PCP in 1-2 weeks 2. Follow-up with orthopedic surgery within 1 to 2 weeks and she has an appointment scheduled for Tuesday next week 3. Follow-up with Gastroenterology if necessary 4. Follow-up with hematology in outpatient setting and will have PCP refer back to her hematologist for likely anemia of chronic disease and question if EPO would help the patient 5. Follow-up with Pulmonary Dr. Vaughan Browner in the outpatient setting for enlarging pulmonary nodule 6. Please obtain CMP/CBC, Mag, Phos in one week 7. Please follow up on the following pending results: Gastric polyp biopsy results  Home Health: Yes Equipment/Devices: None recommended by PT  Discharge Condition: Stable CODE STATUS: FULL CODE  Diet recommendation: Heart Healthy Diet  Brief/Interim Summary: HPI per Dr. Nita Sells on 08/04/2019 Very pleasant 85 year old white female Recent admit 831 through 07/28/2019 with elective left knee osteoarthritis-this was performed by surgeon Narda Bonds and patient was discharged and underwent therapy at home Patient states that she was feeling swollen and fatigued post surgery and when her therapist came out her blood pressure was found to be low so she called her orthopedic surgeon who prompted her to go to her regular doctor-her regular doctor did blood work and told her to come to the ED because her hemoglobin was found to be low it is unclear what it was Patient has been taking aspirin 2 tablets a day since surgery She has sometimes dark stool because she is on iron and in the past has been seen by gastroenterology Dr. Ardis Hughs and had a polypectomy in the remote past  **Interim  History  Patient was transfused 2 units of PRBCs and underwent EGD by Dr. Arelia Longest which showed a normal esophagus and gastric erosions with stigmata of recent bleeding and is felt to be Cameron erosions caused by diaphragmatic impingement.  She also had a single gastric polyp that was biopsied and a duodenum examination was normal.  Dr. Arelia Longest felt that her anemia is most likely related to her knee replacement rather than this recent gastric bleeding and felt that she may have bled some from her Cameron's erosions but usually a slow bleed and thinks it is in the background of anemia of chronic disease given her iron stores and B12 levels.  GI recommended resuming regular diet and discharging home.  Dr. Arelia Longest felt that she could go back on aspirin twice a day but I spoke with patient's primary orthopedic surgeon Dr. Wynelle Link who recommended 325 mg of aspirin daily and warm compresses for her superficial DVT.  GI recommended pantoprazole 40 g p.o. daily and PRN follow-up.  Patient felt well and was deemed stable for discharge and PT OT recommending home health OT and she was deemed stable for discharge and will need follow-up with PCP, orthopedic surgery as well as gastroenterology if necessary.  Discharge Diagnoses:  Active Problems:   Melena   Symptomatic anemia   Acute deep vein thrombosis (DVT) of calf muscle vein of left lower extremity   Cameron ulcer, acute   Gastric polyp  Acute Symptomatic Anemia, improved -Likely secondary to blood loss in addition to use of her aspirin 325 mg p.o. twice daily for DVT prophylaxis in the setting of her recent knee surgery -Patient has  been having some darker stools but she is on IV iron -FOBT was Negative  -She was typed and screened and transfused 2 units of PRBCs with improvement of hemoglobin -Gastroenterology consulted for further evaluation recommendations and recommending an EGD to initially evaluate and recommending IV Protonix 40 mg every 12 hours  while inpatient holding all aspirin and NSAIDs currently but after EGD was done with Dr. Arelia Longest recommending resuming her aspirin and recommending continue pantoprazole 40 mg p.o. daily -Baseline hemoglobin is around 10 -Anemia panel done and showed an iron level of 78, saturation ratios of 37.1, ferritin level 484.4, transferrin 150.0, folate level greater than 24.7, and vitamin B12 level of 349 -On presentation her hemoglobin/hematocrit was 7.2/20.9 and then further dropped to 6.9/21.9; she was transfused 2 units of PRBCs and hemoglobin/hematocrit improved to 8.4/6.5 -EGD done and showed a normal esophagus but did also show some gastric erosions with stigmata of recent bleeding likely secondary to Cameron's erosions caused by diaphragmatic impingement; patient had a single gastric polyp that was biopsied and the other examined duodenum was normal -Monitor for signs and symptoms of bleeding -We will continue with ferrous sulfate 325 mg p.o. twice daily for now -GI recommending patient going back on her aspirin and continuing pantoprazole 40 mg daily and having a referral to hematology for her chronic anemia -Repeat CBC within 1 week and follow-up with PCP and orthopedic surgery  Recent Left Knee Surgery  -Done by Dr. Gaynelle Arabian on 07/26/2019 -Continue with tramadol 50-100 mg p.o. every 12 PRN for moderate pain along with oxycodone 5 to 10 mg p.o. every 4 PRN for severe pain -Continue methocarbamol 500 mg p.o. every 8 as needed muscle spasm -Also continue with gabapentin 100 g p.o. 3 times daily   CKD stage III, stable -Patient presented with a BUN/creatinine of 25/1.51 and is now improved to 23/1.27 after 1 unit of pRBC and repeat this morning showed BUN/creatinine of 22/1.4. -Avoid nephrotoxic medications, contrast dyes as well as hypotension possible -Will continue Torsemide 20 mg po Daily  -Repeat CMP within 1 week  GERD -Continued with IV pantoprazole 40 mg every 12h and changed to  p.o.  Tonics 40 mg p.o. daily at the recommendation of GI  Left leg edema in the setting of gastrocnemius DVT in setting of recent total knee arthroplasty on 07/26/2019 -D-Dimer is -LE Venous Duplex showed "Findings consistent with acute deep vein thrombosis involving the left gastrocnemius veins. No cystic structure found in the popliteal fossa." -Given the superficial DVT and Dr. Arlyss Queen discussion with Hematology Dr. Alen Blew we will not anticoagulate currently as she is also having a suspected GI bleed; patient is a high risk patient however Dr. Alen Blew believes that this patient has a low risk for embolization and recommends this continued monitoring -We will recommend serial venous duplexes in outpatient setting to monitor -Continued to hold her aspirin but I spoke with her primary orthopedic surgeon Dr. Stephanie Coup recommends the patient go back on 325 mg p.o. daily and apply warm compresses -Patient will see her orthopedic surgeon on Tuesday next week and do not feel that she currently needs full dose anticoagulation but she will be reevaluated at that time at the follow-up appointment  Chronic Diastolic CHF HTN -C/w Carvedilol 12.5 mg po BID and Torsemide 20 mg po Daily -Last ECHO was on 02/18/2018; Needs to have this repeated as an outpatient   Pulmonary Nodule -Noted on CT Scan on 06/18/2019 and has enlarged to 9 mm -Needs to follow up  with Dr. Vaughan Browner as an outpatient for further evaluation and serial monitoring  Hypermagnesemia -Mild and was 2.5 -Continue to Monitor and Repeat CMP in AM   Obesity -Estimated body mass index is 30.66 kg/m as calculated from the following:   Height as of this encounter: 5\' 3"  (1.6 m).   Weight as of this encounter: 78.5 kg. -Weight Loss and Dietary Counseling given   Discharge Instructions Discharge Instructions    Call MD for:  difficulty breathing, headache or visual disturbances   Complete by: As directed    Call MD for:  extreme  fatigue   Complete by: As directed    Call MD for:  hives   Complete by: As directed    Call MD for:  persistant dizziness or light-headedness   Complete by: As directed    Call MD for:  persistant nausea and vomiting   Complete by: As directed    Call MD for:  redness, tenderness, or signs of infection (pain, swelling, redness, odor or green/yellow discharge around incision site)   Complete by: As directed    Call MD for:  severe uncontrolled pain   Complete by: As directed    Call MD for:  temperature >100.4   Complete by: As directed    Diet - low sodium heart healthy   Complete by: As directed    Discharge instructions   Complete by: As directed    You were cared for by a hospitalist during your hospital stay. If you have any questions about your discharge medications or the care you received while you were in the hospital after you are discharged, you can call the unit and ask to speak with the hospitalist on call if the hospitalist that took care of you is not available. Once you are discharged, your primary care physician will handle any further medical issues. Please note that NO REFILLS for any discharge medications will be authorized once you are discharged, as it is imperative that you return to your primary care physician (or establish a relationship with a primary care physician if you do not have one) for your aftercare needs so that they can reassess your need for medications and monitor your lab values.  Follow up with PCP, Orthopedic Surgery, and Gastroenterology if necessary. Take all medications as prescribed. If symptoms change or worsen please return to the ED for evaluation   Increase activity slowly   Complete by: As directed      Allergies as of 08/06/2019      Reactions   Sulfonamide Derivatives Rash   Childhood reaction      Medication List    TAKE these medications   aspirin 325 MG EC tablet Take 1 tablet (325 mg total) by mouth daily. What changed: when  to take this   CALCIUM CARBONATE-VITAMIN D3 PO Take 1 tablet by mouth daily.   carvedilol 12.5 MG tablet Commonly known as: COREG TAKE 1 TABLET (12.5 MG TOTAL) BY MOUTH 2 (TWO) TIMES DAILY WITH A MEAL.   cholecalciferol 1000 units tablet Commonly known as: VITAMIN D Take 1,000 Units by mouth daily.   ferrous sulfate 325 (65 FE) MG tablet Take 1 tablet (325 mg total) by mouth 2 (two) times daily with a meal.   gabapentin 100 MG capsule Commonly known as: NEURONTIN Take 1 capsule (100 mg total) by mouth 3 (three) times daily. 1 tab 3x/day for 2 weeks. 1 tab 2x/day for 2 weeks. 1 tab a day for 2 weeks  methocarbamol 500 MG tablet Commonly known as: ROBAXIN Take 1 tablet (500 mg total) by mouth every 6 (six) hours as needed for muscle spasms.   multivitamin with minerals Tabs tablet Take 1 tablet by mouth daily.   OVER THE COUNTER MEDICATION Apply 1 application topically daily as needed (lower back.). hemp freeze gel   oxyCODONE 5 MG immediate release tablet Commonly known as: Oxy IR/ROXICODONE Take 1-2 tablets (5-10 mg total) by mouth every 4 (four) hours as needed for severe pain.   pantoprazole 40 MG tablet Commonly known as: PROTONIX Take 1 tablet (40 mg total) by mouth daily.   SOOTHE XP OP Place 1 drop into both eyes 2 (two) times daily.   torsemide 20 MG tablet Commonly known as: Demadex Take 1 tablet (20 mg total) by mouth daily.   traMADol 50 MG tablet Commonly known as: ULTRAM Take 1-2 tablets (50-100 mg total) by mouth every 12 (twelve) hours as needed for moderate pain.   vitamin C 500 MG tablet Commonly known as: ASCORBIC ACID Take 500 mg by mouth daily.   zinc gluconate 50 MG tablet Take 1 tablet (50 mg total) daily by mouth.       Allergies  Allergen Reactions  . Sulfonamide Derivatives Rash    Childhood reaction   Consultations:  Gastroenterology  Procedures/Studies: Vas [place] Lower Extremity Venous (dvt) (only [facility])  Result Date:  08/04/2019  Lower Venous Study Indications: Pain, Swelling, and total left knee replacement 07/26/2019.  Limitations: Body habitus and poor ultrasound/tissue interface. Comparison Study: No recent prior study. Performing Technologist: Maudry Mayhew MHA, RDMS, RVT, RDCS  Examination Guidelines: A complete evaluation includes B-mode imaging, spectral Doppler, color Doppler, and power Doppler as needed of all accessible portions of each vessel. Bilateral testing is considered an integral part of a complete examination. Limited examinations for reoccurring indications may be performed as noted.  +---------+---------------+---------+-----------+----------+--------------+ LEFT     CompressibilityPhasicitySpontaneityPropertiesThrombus Aging +---------+---------------+---------+-----------+----------+--------------+ CFV      Full           Yes      Yes                                 +---------+---------------+---------+-----------+----------+--------------+ SFJ      Full                                                        +---------+---------------+---------+-----------+----------+--------------+ FV Prox  Full                                                        +---------+---------------+---------+-----------+----------+--------------+ FV Mid   Full                                                        +---------+---------------+---------+-----------+----------+--------------+ FV DistalFull                                                        +---------+---------------+---------+-----------+----------+--------------+  PFV      Full                                                        +---------+---------------+---------+-----------+----------+--------------+ POP      Full           Yes      Yes                                 +---------+---------------+---------+-----------+----------+--------------+ PTV      Full                                                         +---------+---------------+---------+-----------+----------+--------------+ PERO     Full                                                        +---------+---------------+---------+-----------+----------+--------------+ Gastroc  None                    No                   Acute          +---------+---------------+---------+-----------+----------+--------------+  Unable to visualize right common femoral vein due to patient position.  Summary: Left: Findings consistent with acute deep vein thrombosis involving the left gastrocnemius veins. No cystic structure found in the popliteal fossa.  *See table(s) above for measurements and observations. Electronically signed by Curt Jews MD on 08/04/2019 at 4:46:26 PM.    Final    EGD on 08/06/2019  Findings:      The examined esophagus was normal.      A 5 cm hiatal hernia was present.      Multiple non-bleeding localized erosions were found in the gastric body.       There were stigmata of recent bleeding.      A single diminutive sessile polyp with no bleeding and no stigmata of       recent bleeding was found in the gastric body. Biopsies were taken with       a cold forceps for histology. Verification of patient identification for       the specimen was done. Estimated blood loss was minimal.      The examined duodenum was normal.      The cardia and gastric fundus were normal on retroflexion.      The exam was otherwise without abnormality. Impression:               - Normal esophagus.                           - 5 cm hiatal hernia.                           - Gastric erosions with stigmata of recent  bleeding. CAMERON'S EROSIONS - CAUSED BY                            DIAPHRAGMATIC IMPINGEMENT AND LEAD TO BLEEDING                           - A single gastric polyp. Biopsied.                           - Normal examined duodenum.                           - The examination was otherwise  normal.                           I ACTUALLY THINK ANEMIA MOSTLY RELATED TO KNEE                            REPLACEMENT - HER HGB WAS 8.2 ON D/C FROM THAT AND                            NOT THAT MUCH LOWER ON ADMIT HERE BUT DROPPED W/                            HYDRATION - MAY HAVE BLED SOME FROM CAMERON'S                            EROSIONS ALSO BUT USUALLY SLOW BLEED - THINK                            BACKGROUND OF ANEMIA OF CHRONIC DISEASE - IRON                            STORES AND B12 NL Moderate Sedation:      Not Applicable - Patient had care per Anesthesia. Recommendation:           - Return patient to hospital ward for possible                            discharge same day.                           - Resume regular diet.                           - GI signing off                           1A) I will call her with polyp bx results                           2) I think can go home today                           3) OK to  resume 2 ASA/day                           4) pantoprazole 40 mg qd                           5) F/U PCP as planned                           6) Do not think GI f/u needed                           7) Recommend outpatient hematology evaluation be                            considered re: chronic anemia - ? if EPO would help                            - CORRECTION SHE HAS SEEN DR. Julien Nordmann IN PAST BUT                            NOT SINCE 2016 SO WOULD STILL CONSIDER F/U                           8) I left message with daughter and gave her my #                            to call me back to review results - DID TALK TO HER                            AND EXPLAIN  Subjective: Seen and examined at bedside she is doing well after her EGD.  Denies chest pain, lightheadedness or dizziness.  No nausea or vomiting.  Felt well and improved.  I spoke to her about follow-up with her PCP as well as orthopedic surgery and she understands and agrees and has an appointment with Dr.  Wynelle Link next Tuesday.   Discharge Exam: Vitals:   08/06/19 0916 08/06/19 0931  BP:  (!) 136/57  Pulse: 73 70  Resp: (!) 22 20  Temp:  98 F (36.7 C)  SpO2: 94% 94%   Vitals:   08/06/19 0910 08/06/19 0915 08/06/19 0916 08/06/19 0931  BP: (!) 143/43   (!) 136/57  Pulse: 73 75 73 70  Resp: (!) 22 18 (!) 22 20  Temp:    98 F (36.7 C)  TempSrc:    Oral  SpO2: 92% 91% 94% 94%  Weight:      Height:       General: Pt is alert, awake, not in acute distress Cardiovascular: RRR, S1/S2 +, no rubs, no gallops Respiratory: Diminished bilaterally, no wheezing, no rhonchi Abdominal: Soft, NT, Distended slightly, bowel sounds + Extremities: Trace edema, no cyanosis  The results of significant diagnostics from this hospitalization (including imaging, microbiology, ancillary and laboratory) are listed below for reference.    Microbiology: Recent Results (from the past 240 hour(s))  SARS Coronavirus 2 [facility] order, Performed in [facility] hospital lab) Nasopharyngeal Nasopharyngeal Swab     Status: None  Collection Time: 08/04/19  2:09 PM   Specimen: Nasopharyngeal Swab  Result Value Ref Range Status   SARS Coronavirus 2 NEGATIVE NEGATIVE Final    Comment: (NOTE) If result is NEGATIVE SARS-CoV-2 target nucleic acids are NOT DETECTED. The SARS-CoV-2 RNA is generally detectable in upper and lower  respiratory specimens during the acute phase of infection. The lowest  concentration of SARS-CoV-2 viral copies this assay can detect is 250  copies / mL. A negative result does not preclude SARS-CoV-2 infection  and should not be used as the sole basis for treatment or other  patient management decisions.  A negative result may occur with  improper specimen collection / handling, submission of specimen other  than nasopharyngeal swab, presence of viral mutation(s) within the  areas targeted by this assay, and inadequate number of viral copies  (<250 copies / mL). A negative result must  be combined with clinical  observations, patient history, and epidemiological information. If result is POSITIVE SARS-CoV-2 target nucleic acids are DETECTED. The SARS-CoV-2 RNA is generally detectable in upper and lower  respiratory specimens dur ing the acute phase of infection.  Positive  results are indicative of active infection with SARS-CoV-2.  Clinical  correlation with patient history and other diagnostic information is  necessary to determine patient infection status.  Positive results do  not rule out bacterial infection or co-infection with other viruses. If result is PRESUMPTIVE POSTIVE SARS-CoV-2 nucleic acids MAY BE PRESENT.   A presumptive positive result was obtained on the submitted specimen  and confirmed on repeat testing.  While 2019 novel coronavirus  (SARS-CoV-2) nucleic acids may be present in the submitted sample  additional confirmatory testing may be necessary for epidemiological  and / or clinical management purposes  to differentiate between  SARS-CoV-2 and other Sarbecovirus currently known to infect humans.  If clinically indicated additional testing with an alternate test  methodology [phone]) is advised. The SARS-CoV-2 RNA is generally  detectable in upper and lower respiratory sp ecimens during the acute  phase of infection. The expected result is Negative. Fact Sheet for Patients:  StrictlyIdeas.no Fact Sheet for Healthcare Providers: BankingDealers.co.za This test is not yet approved or cleared by the [place] FDA and has been authorized for detection and/or diagnosis of SARS-CoV-2 by FDA under an Emergency Use Authorization (EUA).  This EUA will remain in effect (meaning this test can be used) for the duration of the COVID-19 declaration under Section 564(b)(1) of the Act, 21 U.S.C. section 360bbb-3(b)(1), unless the authorization is terminated or revoked sooner. Performed at [facility], [place] [address], [place], [place] [address]     Labs: BNP (last 3 results) No results for input(s): BNP in the last 8760 hours. Basic Metabolic Panel: Recent Labs  Lab 08/04/19 1054 08/04/19 1408 08/05/19 0246 08/06/19 0241  NA 141 141 139 140  K 3.8 3.8 3.9 3.6  CL 106 105 108 103  CO2 26 25 22 25   GLUCOSE 140* 100* 108* 118*  BUN 25* 25* 23 22  CREATININE 1.51* 1.50* 1.27* 1.44*  CALCIUM 8.8 8.9 8.1* 8.7*  MG  --   --  2.5* 2.4  PHOS  --   --  4.0 4.2   Liver Function Tests: Recent Labs  Lab 08/04/19 1054 08/04/19 1408 08/05/19 0246 08/06/19 0241  AST 12 15 17  14*  ALT 9 13 11 11   ALKPHOS 65 66 63 60  BILITOT 2.0* 2.0* 2.6* 2.7*  PROT 6.1 6.4* 5.9* 5.9*  ALBUMIN 3.8 3.9 3.6 3.5   No results for input(s): LIPASE, AMYLASE in the last 168 hours. No results for input(s): AMMONIA in the last 168 hours. CBC: Recent Labs  Lab 08/04/19 1054 08/04/19 1408 08/04/19 2117 08/05/19 0246 08/06/19 0241  WBC 9.8 9.3  --  8.4 8.1  NEUTROABS 7.4 6.5  --   --  5.5  HGB 7.2 Repeated and verified X2.* 6.9* 7.6* 7.7* 8.8*  HCT 20.9 Repeated and verified X2.* 21.9* 24.1* 23.6* 26.5*  MCV 91.8 97.8  --  95.9 93.0  PLT 291.0 273  --  257 244   Cardiac Enzymes: No results for input(s): CKTOTAL, CKMB, CKMBINDEX, TROPONINI in the last 168 hours. BNP: Invalid input(s): POCBNP CBG: No results for input(s): GLUCAP in the last 168 hours. D-Dimer Recent Labs    08/04/19 1054  DDIMER 4.21*   Hgb A1c No results for input(s): HGBA1C in the last 72 hours. Lipid Profile No results for input(s): CHOL, HDL, LDLCALC, TRIG, CHOLHDL, LDLDIRECT in the last 72 hours. Thyroid function studies Recent Labs    08/04/19 1054  TSH 1.12   Anemia work up Recent Labs    08/04/19 1054  VITAMINB12 349  FOLATE >24.7  FERRITIN 484.4*  IRON 78  RETICCTPCT 7.3   Urinalysis    Component Value Date/Time   COLORURINE YELLOW 08/04/2019 1340   APPEARANCEUR CLEAR  08/04/2019 1340   LABSPEC 1.015 08/04/2019 1340   PHURINE 7.0 08/04/2019 1340   GLUCOSEU NEGATIVE 08/04/2019 1340   GLUCOSEU NEGATIVE 06/08/2019 1109   [place] 08/04/2019 1340   HGBUR moderate 10/06/2009 1602   BILIRUBINUR NEGATIVE 08/04/2019 1340   BILIRUBINUR 1+ 03/16/2018 1327   KETONESUR NEGATIVE 08/04/2019 1340   PROTEINUR NEGATIVE 08/04/2019 1340   UROBILINOGEN 0.2 06/08/2019 1109   NITRITE NEGATIVE 08/04/2019 1340   LEUKOCYTESUR TRACE (A) 08/04/2019 1340   Sepsis Labs Invalid input(s): PROCALCITONIN,  WBC,  LACTICIDVEN Microbiology Recent Results (from the past 240 hour(s))  SARS Coronavirus 2 [facility] order, Performed in [place] hospital lab) Nasopharyngeal Nasopharyngeal Swab     Status: None   Collection Time: 08/04/19  2:09 PM   Specimen: Nasopharyngeal Swab  Result Value Ref Range Status   SARS Coronavirus 2 NEGATIVE NEGATIVE Final    Comment: (NOTE) If result is NEGATIVE SARS-CoV-2 target nucleic acids are NOT DETECTED. The SARS-CoV-2 RNA is generally detectable in upper and lower  respiratory specimens during the acute phase of infection. The lowest  concentration of SARS-CoV-2 viral copies this assay can detect is 250  copies / mL. A negative result does not preclude SARS-CoV-2 infection  and should not be used as the sole basis for treatment or other  patient management decisions.  A negative result may occur with  improper specimen collection / handling, submission of specimen other  than nasopharyngeal swab, presence of viral mutation(s) within the  areas targeted by this assay, and inadequate number of viral copies  (<250 copies / mL). A negative result must be combined with clinical  observations, patient history, and epidemiological information. If result is POSITIVE SARS-CoV-2 target nucleic acids are DETECTED. The SARS-CoV-2 RNA is generally detectable in upper and lower  respiratory specimens dur ing the acute phase of infection.  Positive   results are indicative of active infection with SARS-CoV-2.  Clinical  correlation with patient history and other diagnostic information is  necessary to determine patient infection status.  Positive results do  not rule out bacterial infection or co-infection with other viruses. If  result is PRESUMPTIVE POSTIVE SARS-CoV-2 nucleic acids MAY BE PRESENT.   A presumptive positive result was obtained on the submitted specimen  and confirmed on repeat testing.  While 2019 novel coronavirus  (SARS-CoV-2) nucleic acids may be present in the submitted sample  additional confirmatory testing may be necessary for epidemiological  and / or clinical management purposes  to differentiate between  SARS-CoV-2 and other Sarbecovirus currently known to infect humans.  If clinically indicated additional testing with an alternate test  methodology [phone]) is advised. The SARS-CoV-2 RNA is generally  detectable in upper and lower respiratory sp ecimens during the acute  phase of infection. The expected result is Negative. Fact Sheet for Patients:  StrictlyIdeas.no Fact Sheet for Healthcare Providers: BankingDealers.co.za This test is not yet approved or cleared by the [place] FDA and has been authorized for detection and/or diagnosis of SARS-CoV-2 by FDA under an Emergency Use Authorization (EUA).  This EUA will remain in effect (meaning this test can be used) for the duration of the COVID-19 declaration under Section 564(b)(1) of the Act, 21 U.S.C. section 360bbb-3(b)(1), unless the authorization is terminated or revoked sooner. Performed at [facility], [place] [address], [place], [place] [address]    Time coordinating discharge: 35 minutes  SIGNED:  Kerney Elbe, DO [facility] 08/06/2019, 6:12 PM Pager is on [place]  If 7PM-7AM, please contact night-coverage [email or web address] Password [email or web address]

## 2019-08-06 NOTE — Op Note (Addendum)
[facility] Patient Name: Claudia Lara Procedure Date: 08/06/2019 MRN: [id number] Attending MD: Gatha Mayer , MD Date of Birth: 06-18-1932 CSN: [id number] Age: 85 Admit Type: Inpatient Procedure:                Upper GI endoscopy Indications:              Anemia, Dark stools, ? melena Providers:                Gatha Mayer, MD, Cleda Daub, RN, Marguerita Merles, Technician, Anne Fu CRNA, CRNA Referring MD:              Medicines:                Propofol per Anesthesia, Monitored Anesthesia Care Complications:            No immediate complications. Estimated Blood Loss:     Estimated blood loss was minimal. Procedure:                Pre-Anesthesia Assessment:                           - Prior to the procedure, a History and Physical                            was performed, and patient medications and                            allergies were reviewed. The patient's tolerance of                            previous anesthesia was also reviewed. The risks                            and benefits of the procedure and the sedation                            options and risks were discussed with the patient.                            All questions were answered, and informed consent                            was obtained. Prior Anticoagulants: The patient has                            taken no previous anticoagulant or antiplatelet                            agents. ASA Grade Assessment: III - A patient with                            severe systemic disease. After reviewing the risks  and benefits, the patient was deemed in                            satisfactory condition to undergo the procedure.                           After obtaining informed consent, the endoscope was                            passed under direct vision. Throughout the                            procedure, the patient's blood  pressure, pulse, and                            oxygen saturations were monitored continuously. The                            [id number]) Olympus gastroscope was                            introduced through the mouth, and advanced to the                            second part of duodenum. The upper GI endoscopy was                            accomplished without difficulty. The patient                            tolerated the procedure well. Scope In: Scope Out: Findings:      The examined esophagus was normal.      A 5 cm hiatal hernia was present.      Multiple non-bleeding localized erosions were found in the gastric body.       There were stigmata of recent bleeding.      A single diminutive sessile polyp with no bleeding and no stigmata of       recent bleeding was found in the gastric body. Biopsies were taken with       a cold forceps for histology. Verification of patient identification for       the specimen was done. Estimated blood loss was minimal.      The examined duodenum was normal.      The cardia and gastric fundus were normal on retroflexion.      The exam was otherwise without abnormality. Impression:               - Normal esophagus.                           - 5 cm hiatal hernia.                           - Gastric erosions with stigmata of recent  bleeding. CAMERON'S EROSIONS - CAUSED BY                            DIAPHRAGMATIC IMPINGEMENT AND LEAD TO BLEEDING                           - A single gastric polyp. Biopsied.                           - Normal examined duodenum.                           - The examination was otherwise normal.                           I ACTUALLY THINK ANEMIA MOSTLY RELATED TO KNEE                            REPLACEMENT - HER HGB WAS 8.2 ON D/C FROM THAT AND                            NOT THAT MUCH LOWER ON ADMIT HERE BUT DROPPED W/                            HYDRATION - MAY HAVE BLED SOME FROM  CAMERON'S                            EROSIONS ALSO BUT USUALLY SLOW BLEED - THINK                            BACKGROUND OF ANEMIA OF CHRONIC DISEASE - IRON                            STORES AND B12 NL Moderate Sedation:      Not Applicable - Patient had care per Anesthesia. Recommendation:           - Return patient to hospital ward for possible                            discharge same day.                           - Resume regular diet.                           - GI signing off                           1A) I will call her with polyp bx results                           2) I think can go home today                           3) OK to  resume 2 ASA/day                           4) pantoprazole 40 mg qd                           5) F/U PCP as planned                           6) Do not think GI f/u needed                           7) Recommend outpatient hematology evaluation be                            considered re: chronic anemia - ? if EPO would help                            - CORRECTION SHE HAS SEEN DR. Julien Lara IN PAST BUT                            NOT SINCE 2016 SO WOULD STILL CONSIDER F/U                           8) I left message with daughter and gave her my #                            to call me back to review results - DID TALK TO HER                            AND EXPLAIN Procedure Code(s):        --- Professional ---                           [phone], Esophagogastroduodenoscopy, flexible,                            transoral; with biopsy, single or multiple Diagnosis Code(s):        --- Professional ---                           K44.9, Diaphragmatic hernia without obstruction or                            gangrene                           K25.4, Chronic or unspecified gastric ulcer with                            hemorrhage                           K31.7, Polyp of stomach and duodenum  D64.9, Anemia, unspecified CPT copyright 2019 American  Medical Association. All rights reserved. The codes documented in this report are preliminary and upon coder review may  be revised to meet current compliance requirements. Gatha Mayer, MD 08/06/2019 9:15:38 AM This report has been signed electronically. Number of Addenda: 0

## 2019-08-07 LAB — LACTATE DEHYDROGENASE: LDH: 240 U/L (ref 120–250)

## 2019-08-07 LAB — VITAMIN B1: Vitamin B1 (Thiamine): 25 nmol/L (ref 8–30)

## 2019-08-07 LAB — RETICULOCYTES
ABS Retic: 163520 cells/uL — ABNORMAL HIGH (ref 20000–8000)
Retic Ct Pct: 7.3 %

## 2019-08-07 LAB — D-DIMER, QUANTITATIVE: D-Dimer, Quant: 4.21 mcg/mL FEU — ABNORMAL HIGH (ref <0.50)

## 2019-08-09 ENCOUNTER — Ambulatory Visit: Payer: Medicare Other | Attending: Orthopedic Surgery | Admitting: Physical Therapy

## 2019-08-09 ENCOUNTER — Other Ambulatory Visit: Payer: Self-pay

## 2019-08-09 ENCOUNTER — Encounter (HOSPITAL_COMMUNITY): Payer: Self-pay | Admitting: Internal Medicine

## 2019-08-09 DIAGNOSIS — M25662 Stiffness of left knee, not elsewhere classified: Secondary | ICD-10-CM | POA: Insufficient documentation

## 2019-08-09 DIAGNOSIS — M25562 Pain in left knee: Secondary | ICD-10-CM | POA: Diagnosis not present

## 2019-08-09 DIAGNOSIS — R262 Difficulty in walking, not elsewhere classified: Secondary | ICD-10-CM

## 2019-08-09 DIAGNOSIS — R600 Localized edema: Secondary | ICD-10-CM | POA: Diagnosis not present

## 2019-08-09 NOTE — Therapy (Signed)
[place], [place], [address] Phone: [phone]   Fax:  [phone]  Physical Therapy Evaluation  Patient Details  Name: Claudia Lara MRN: [id number] Date of Birth: Feb 01, 1932 Referring Provider (PT): Alusio   Encounter Date: 08/09/2019  PT End of Session - 08/09/19 1503    Visit Number  1    Date for PT Re-Evaluation  10/09/19    Authorization Type  MCR and KX at visit 10    PT Start Time  1443    PT Stop Time  1522    PT Time Calculation (min)  39 min    Activity Tolerance  Patient tolerated treatment well    Behavior During Therapy  [facility] for tasks assessed/performed       Past Medical History:  Diagnosis Date  . ANEMIA 08/22/2009  . Aortic stenosis   . AR (aortic regurgitation) 01/2019   Mild to Moderate, Noted on ECHO  . CAROTID ARTERY STENOSIS 01/16/2010  . Complication of anesthesia    very hard to wake up from surgery  . DYSPNEA ON EXERTION 11/16/2007  . Gallstones   . GEN OSTEOARTHROSIS INVOLVING MULTIPLE SITES 11/11/2008  . GERD (gastroesophageal reflux disease)   . Grade I diastolic dysfunction [id number]   Noted on ECHO  . Heart murmur   . HEMORRHOIDS, INTERNAL    surgery was in the 90's (per patient)  . History of blood transfusion    after hip and knee replacements  . NECK PAIN, ACUTE 12/28/2009  . PERIPHERAL EDEMA 10/06/2007  . PONV (postoperative nausea and vomiting)   . Pulmonary nodule 06/18/2019   noted on CT Chest   . PVD 10/06/2007  . Unspecified essential hypertension 10/19/2007    Past Surgical History:  Procedure Laterality Date  . ABDOMINAL HYSTERECTOMY    . BIOPSY  08/06/2019   Procedure: BIOPSY;  Surgeon: Gatha Mayer, MD;  Location: [facility];  Service: Gastroenterology;;  . [name]     bilat  . CARPAL TUNNEL RELEASE    . CHOLECYSTECTOMY  01/29/2016   at [facility]  . CHOLECYSTECTOMY    . ENDOSCOPIC RETROGRADE  CHOLANGIOPANCREATOGRAPHY (ERCP) WITH PROPOFOL N/A 12/01/2015   Procedure: ENDOSCOPIC RETROGRADE CHOLANGIOPANCREATOGRAPHY (ERCP) WITH PROPOFOL;  Surgeon: [name], MD;  Location: [facility];  Service: Endoscopy;  Laterality: N/A;  . ENDOSCOPIC RETROGRADE CHOLANGIOPANCREATOGRAPHY (ERCP) WITH PROPOFOL N/A 02/01/2016   Procedure: ENDOSCOPIC RETROGRADE CHOLANGIOPANCREATOGRAPHY (ERCP) WITH PROPOFOL;  Surgeon: [name], MD;  Location: [facility];  Service: Endoscopy;  Laterality: N/A;  . ENDOSCOPIC RETROGRADE CHOLANGIOPANCREATOGRAPHY (ERCP) WITH PROPOFOL N/A 04/07/2018   Procedure: ENDOSCOPIC RETROGRADE CHOLANGIOPANCREATOGRAPHY (ERCP) WITH PROPOFOL;  Surgeon: [name], MD;  Location: [facility];  Service: Endoscopy;  Laterality: N/A;  . ESOPHAGOGASTRODUODENOSCOPY (EGD) WITH PROPOFOL N/A 08/06/2019   Procedure: ESOPHAGOGASTRODUODENOSCOPY (EGD) WITH PROPOFOL;  Surgeon: Gatha Mayer, MD;  Location: [facility];  Service: Gastroenterology;  Laterality: N/A;  . HAMMER TOE SURGERY    . HEMORRHOID SURGERY    . Hyperplastic colon polyps, removed  2007   By Dr. Penelope Coop  . JOINT REPLACEMENT  2011   right knee  . OOPHORECTOMY    . REMOVAL OF STONES  04/07/2018   Procedure: REMOVAL OF STONES;  Surgeon: [name], MD;  Location: [facility];  Service: Endoscopy;;  . ROTATOR CUFF REPAIR  2004   left (Dr. Durward Fortes)  . SPHINCTEROTOMY  04/07/2018   Procedure: SPHINCTEROTOMY;  Surgeon: [name],  MD;  Location: [facility];  Service: Endoscopy;;  . TOTAL HIP ARTHROPLASTY Right 11/26/2016   Procedure: RIGHT TOTAL HIP ARTHROPLASTY;  Surgeon: Garald Balding, MD;  Location: [facility];  Service: Orthopedics;  Laterality: Right;  . TOTAL KNEE ARTHROPLASTY Right   . TOTAL KNEE ARTHROPLASTY Left 07/26/2019   Procedure: TOTAL KNEE ARTHROPLASTY;  Surgeon: Gaynelle Arabian, MD;  Location: [facility];  Service: Orthopedics;  Laterality: Left;  32min    There were no vitals filed for this  visit.   Subjective Assessment - 08/09/19 1444    Subjective  Patient had a left TKR on 07/26/19.  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    Limitations  Lifting;Standing;Walking;House hold activities    Patient Stated Goals  walk better, have normal ROM and strength    Currently in Pain?  Yes    Pain Score  1     Pain Location  Knee    Pain Orientation  Left    Pain Descriptors / Indicators  Aching;Tightness    Pain Type  Acute pain;Surgical pain    Pain Onset  1 to 4 weeks ago    Pain Frequency  Constant    Aggravating Factors   with bending and walking , worse at night pain up to 6/10    Pain Relieving Factors  Tylenol, ice rest pain can be 0/10    Effect of Pain on Daily Activities  walking, housework         [facility] PT Assessment - 08/09/19 0001      Assessment   Medical Diagnosis  s/p left TKR     Referring Provider (PT)  Alusio    Onset Date/Surgical Date  07/26/19      Precautions   Precautions  None      Balance Screen   Has the patient fallen in the past 6 months  No    Has the patient had a decrease in activity level because of a fear of falling?   No    Is the patient reluctant to leave their home because of a fear of falling?   No      Home Environment   Additional Comments  has a few steps into the home, she does the yardwork, does her own housework      Prior Function   Level of Independence  Independent    Vocation  Retired    Leisure  no exercise      Observation/Other Assessments-Edema    Edema  Circumferential      Circumferential Edema   Circumferential - Right  42.5 cm mid patella    Circumferential - Left   47 cm      Posture/Postural Control   Posture Comments  fwd head, rounded shoulders      ROM / Strength   AROM / PROM / Strength  AROM;PROM;Strength      AROM   AROM Assessment  Site  Knee    Right/Left Shoulder  Left    Right/Left Knee  Left    Left Knee Extension  23    Left Knee Flexion  80      PROM   PROM Assessment Site  Knee    Right/Left Knee  Left    Left Knee Extension  15    Left Knee  Flexion  85      Strength   Overall Strength Comments  3+/5 in the available ROM      Palpation   Palpation comment  significant pitting edema in the left lower leg      Ambulation/Gait   Gait Comments  uses a FWW, slow, stooped over, step to pattern, very stiff knee      Standardized Balance Assessment   Standardized Balance Assessment  Timed Up and Go Test      Timed Up and Go Test   Normal TUG (seconds)  23                Objective measurements completed on examination: See above findings.      [facility] PT Treatment/Exercise - 08/09/19 0001      Exercises   Exercises  Knee/Hip      Knee/Hip Exercises: Aerobic   Nustep  level 3 x 5 minutes      Modalities   Modalities  Vasopneumatic      Vasopneumatic   Number Minutes Vasopneumatic   10 minutes    Vasopnuematic Location   Knee    Vasopneumatic Pressure  Medium    Vasopneumatic Temperature   39               PT Short Term Goals - 08/09/19 1510      PT SHORT TERM GOAL #1   Title  independent with initial HEP    Time  2    Period  Weeks    Status  New        PT Long Term Goals - 08/09/19 1511      PT LONG TERM GOAL #1   Title  walk 200 feet with SPC or no device    Time  8    Period  Weeks    Status  New      PT LONG TERM GOAL #2   Title  go up her steps step over step    Time  8    Period  Weeks    Status  New      PT LONG TERM GOAL #3   Title  increase AROM of the left knee to 5-115 degrees flexion    Time  8    Period  Weeks    Status  New      PT LONG TERM GOAL #4   Title  decrease pain 50%    Period  Weeks    Status  New      PT LONG TERM GOAL #5   Title  decrease TUG to 16 seconds    Time  8    Period  Weeks    Status  New              Plan - 08/09/19 1504    Clinical Impression Statement  Patient underwent a left TKR on 07/26/19, she has had some complications with anemia and was readmitted to the hospital, she also 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  She is very stiff and very swollen, she is using a FWW, her TUG was 23 seconds with a stiff legged gait.    Personal Factors and Comorbidities  Age;Comorbidity 3+    Comorbidities  GERD, anemia, THR, TKR, RC repair, SOB    Examination-Activity Limitations  Carry;Lift;Locomotion Level;Bed Mobility;Dressing;Squat;Transfers;Toileting;Stand    Examination-Participation Restrictions  Cleaning;Yard Work;Driving  Stability/Clinical Decision Making  Evolving/Moderate complexity    Clinical Decision Making  Moderate    Rehab Potential  Good    PT Frequency  3x / week    PT Duration  8 weeks    PT Treatment/Interventions  ADLs/Self Care Home Management;Electrical Stimulation;Cryotherapy;Ultrasound;Therapeutic activities;Therapeutic exercise;Patient/family education;Manual techniques;Stair training;Gait training;Functional mobility training;Balance training;Vasopneumatic Device    PT Next Visit Plan  slowly start rehab for TKR, she is very swollen and very stiff    Consulted and Agree with Plan of Care  Patient       Patient will benefit from skilled therapeutic intervention in order to improve the following deficits and impairments:  Abnormal gait, Pain, Decreased scar mobility, Decreased mobility, Cardiopulmonary status limiting activity, Decreased activity tolerance, Decreased endurance, Decreased range of motion, Decreased strength, Increased edema, Difficulty walking, Decreased balance  Visit Diagnosis: Acute pain of left knee - Plan: PT plan of care cert/re-cert  Stiffness of left knee, not elsewhere classified - Plan: PT plan of care cert/re-cert  Difficulty in walking, not elsewhere  classified - Plan: PT plan of care cert/re-cert  Localized edema - Plan: PT plan of care cert/re-cert     Problem List Patient Active Problem List   Diagnosis Date Noted  . Cameron ulcer, acute   . Gastric polyp   . Acute deep vein thrombosis (DVT) of calf muscle vein of left lower extremity   . Hypotension due to hypovolemia 08/04/2019  . Leg edema, left 08/04/2019  . Deficiency anemia 08/04/2019  . Symptomatic anemia 08/04/2019  . OA (osteoarthritis) of knee 07/26/2019  . Lung nodule 06/24/2019  . Bilateral leg edema 06/08/2019  . Zinc deficiency 10/03/2017  . Spondylosis, lumbar, with myelopathy 04/09/2017  . Hyperglycemia 03/05/2016  . Melena 08/03/2015  . GERD (gastroesophageal reflux disease) 08/03/2015  . Hemolytic anemia ([place]) 07/12/2015  . Dysuria 05/15/2015  . Chronic venous insufficiency 08/10/2014  . Aortic stenosis, mild 06/29/2014  . Left ventricular diastolic dysfunction, NYHA class 1 06/29/2014  . Senile osteopenia 04/21/2014  . Hyperlipidemia with target LDL less than 130 04/21/2014  . Kidney disease, chronic, stage III (GFR 30-59 ml/min) ([place]) 04/21/2014  . Routine health maintenance 07/16/2012  . Carotid artery stenosis without cerebral infarction 01/16/2010  . Essential hypertension 10/19/2007    Sumner Boast., PT 08/09/2019, 3:14 PM  [address] [place], [place], [address] Phone: [phone]   Fax:  [phone]  Name: Claudia Lara MRN: [id number] Date of Birth: 1932/09/09

## 2019-08-11 ENCOUNTER — Other Ambulatory Visit: Payer: Self-pay

## 2019-08-11 ENCOUNTER — Ambulatory Visit: Payer: Medicare Other | Admitting: Physical Therapy

## 2019-08-11 DIAGNOSIS — R600 Localized edema: Secondary | ICD-10-CM

## 2019-08-11 DIAGNOSIS — M25562 Pain in left knee: Secondary | ICD-10-CM | POA: Diagnosis not present

## 2019-08-11 DIAGNOSIS — R262 Difficulty in walking, not elsewhere classified: Secondary | ICD-10-CM

## 2019-08-11 DIAGNOSIS — M25662 Stiffness of left knee, not elsewhere classified: Secondary | ICD-10-CM | POA: Diagnosis not present

## 2019-08-11 NOTE — Patient Instructions (Addendum)
Access Code: [email or web address]  URL: [email or web address]  Date: 08/11/2019  Prepared by: Madelyn Flavors   Exercises  Supine Ankle Pumps - 15 reps - 3 sets - 1x daily - 7x weekly  Supine Quadricep Sets - 15 reps - 3 sets - 1x daily - 7x weekly  Supine Short Arc Quad - 15 reps - 3 sets - 1x daily - 7x weekly  Supine Heel Slides - 15 reps - 3 sets - 1x daily - 7x weekly  Seated Long Arc Quad - 15 reps - 3 sets - 1x daily - 7x weekly  Standing Hip Abduction with Counter Support - 10 reps - 3 sets - 1x daily - 7x weekly  Seated Knee Flexion Stretch - 15 reps - 3 sets - 1x daily - 7x weekly  Standing March with Counter Support - 10 reps - 3 sets - 1x daily - 7x weekly

## 2019-08-11 NOTE — Therapy (Signed)
[place], [place], [address] Phone: [phone]   Fax:  [phone]  Physical Therapy Treatment  Patient Details  Name: Claudia Lara MRN: [id number] Date of Birth: 29-Oct-1932 Referring Provider (PT): Alusio   Encounter Date: 08/11/2019  PT End of Session - 08/11/19 1433    Visit Number  2    Date for PT Re-Evaluation  10/09/19    Authorization Type  MCR and KX at visit 10    PT Start Time  1431    PT Stop Time  1519    PT Time Calculation (min)  48 min    Activity Tolerance  Patient tolerated treatment well;Patient limited by pain    Behavior During Therapy  [facility] for tasks assessed/performed       Past Medical History:  Diagnosis Date  . ANEMIA 08/22/2009  . Aortic stenosis   . AR (aortic regurgitation) 01/2019   Mild to Moderate, Noted on ECHO  . CAROTID ARTERY STENOSIS 01/16/2010  . Complication of anesthesia    very hard to wake up from surgery  . DYSPNEA ON EXERTION 11/16/2007  . Gallstones   . GEN OSTEOARTHROSIS INVOLVING MULTIPLE SITES 11/11/2008  . GERD (gastroesophageal reflux disease)   . Grade I diastolic dysfunction 123456   Noted on ECHO  . Heart murmur   . HEMORRHOIDS, INTERNAL    surgery was in the 90's (per patient)  . History of blood transfusion    after hip and knee replacements  . NECK PAIN, ACUTE 12/28/2009  . PERIPHERAL EDEMA 10/06/2007  . PONV (postoperative nausea and vomiting)   . Pulmonary nodule 06/18/2019   noted on CT Chest   . PVD 10/06/2007  . Unspecified essential hypertension 10/19/2007    Past Surgical History:  Procedure Laterality Date  . ABDOMINAL HYSTERECTOMY    . BIOPSY  08/06/2019   Procedure: BIOPSY;  Surgeon: Gatha Mayer, MD;  Location: [facility];  Service: Gastroenterology;;  . [name]     bilat  . CARPAL TUNNEL RELEASE    . CHOLECYSTECTOMY  01/29/2016   at [facility]  . CHOLECYSTECTOMY    . ENDOSCOPIC  RETROGRADE CHOLANGIOPANCREATOGRAPHY (ERCP) WITH PROPOFOL N/A 12/01/2015   Procedure: ENDOSCOPIC RETROGRADE CHOLANGIOPANCREATOGRAPHY (ERCP) WITH PROPOFOL;  Surgeon: [name], MD;  Location: [facility];  Service: Endoscopy;  Laterality: N/A;  . ENDOSCOPIC RETROGRADE CHOLANGIOPANCREATOGRAPHY (ERCP) WITH PROPOFOL N/A 02/01/2016   Procedure: ENDOSCOPIC RETROGRADE CHOLANGIOPANCREATOGRAPHY (ERCP) WITH PROPOFOL;  Surgeon: [name], MD;  [facility];  Service: Endoscopy;  Laterality: N/A;  . ENDOSCOPIC RETROGRADE CHOLANGIOPANCREATOGRAPHY (ERCP) WITH PROPOFOL N/A 04/07/2018   Procedure: ENDOSCOPIC RETROGRADE CHOLANGIOPANCREATOGRAPHY (ERCP) WITH PROPOFOL;  Surgeon: [name], MD;  [facility];  Service: Endoscopy;  Laterality: N/A;  . ESOPHAGOGASTRODUODENOSCOPY (EGD) WITH PROPOFOL N/A 08/06/2019   Procedure: ESOPHAGOGASTRODUODENOSCOPY (EGD) WITH PROPOFOL;  Surgeon: Gatha Mayer, MD;  [facility];  Service: Gastroenterology;  Laterality: N/A;  . HAMMER TOE SURGERY    . HEMORRHOID SURGERY    . Hyperplastic colon polyps, removed  2007   By Dr. Penelope Coop  . JOINT REPLACEMENT  2011   right knee  . OOPHORECTOMY    . REMOVAL OF STONES  04/07/2018   Procedure: REMOVAL OF STONES;  Surgeon: [name], MD;  [facility];  Service: Endoscopy;;  . ROTATOR CUFF REPAIR  2004   left (Dr. Durward Fortes)  . SPHINCTEROTOMY  04/07/2018   Procedure: SPHINCTEROTOMY;  Surgeon:  [name], MD;  Location: [facility];  Service: Endoscopy;;  . TOTAL HIP ARTHROPLASTY Right 11/26/2016   Procedure: RIGHT TOTAL HIP ARTHROPLASTY;  Surgeon: Garald Balding, MD;  Location: [facility];  Service: Orthopedics;  Laterality: Right;  . TOTAL KNEE ARTHROPLASTY Right   . TOTAL KNEE ARTHROPLASTY Left 07/26/2019   Procedure: TOTAL KNEE ARTHROPLASTY;  Surgeon: Gaynelle Arabian, MD;  [facility];  Service: Orthopedics;  Laterality: Left;  90min    There were no vitals filed for  this visit.  Subjective Assessment - 08/11/19 1434    Subjective  Patient presents with no pain today.    Limitations  Lifting;Standing;Walking;House hold activities    Patient Stated Goals  walk better, have normal ROM and strength    Currently in Pain?  No/denies                       [facility] Adult PT Treatment/Exercise - 08/11/19 0001      Knee/Hip Exercises: Aerobic   Nustep  level 4 x 5 minutes      Knee/Hip Exercises: Standing   Heel Raises  Both;10 reps    Hip Flexion  Both;2 sets;10 reps    Terminal Knee Extension  Left;15 reps;Theraband    Theraband Level (Terminal Knee Extension)  Level 3 (Green)    Hip Abduction  Both;2 sets;10 reps      Knee/Hip Exercises: Seated   Long Arc Quad  20 reps;Left      Knee/Hip Exercises: Supine   Quad Sets  Left;10 reps    Quad Sets Limitations  5 sec    Short Arc Quad Sets  Left;2 sets;10 reps    Heel Slides  Left;1 set;5 sets    Heel Slides Limitations  with strap      Manual Therapy   Manual Therapy  Soft tissue mobilization;Taping    Soft tissue mobilization  retrograde massage gently pt could not tolerate    Kinesiotex  Edema      Kinesiotix   Edema  3 strips with 3 legs each on lower left leg/foot              PT Education - 08/11/19 1521    Education Details  HEP; taping precautions and when to remove    Person(s) Educated  Patient    Methods  Explanation;Demonstration;Handout    Comprehension  Verbalized understanding;Returned demonstration       PT Short Term Goals - 08/09/19 1510      PT SHORT TERM GOAL #1   Title  independent with initial HEP    Time  2    Period  Weeks    Status  New        PT Long Term Goals - 08/09/19 1511      PT LONG TERM GOAL #1   Title  walk 200 feet with SPC or no device    Time  8    Period  Weeks    Status  New      PT LONG TERM GOAL #2   Title  go up her steps step over step    Time  8    Period  Weeks    Status  New      PT LONG TERM GOAL #3    Title  increase AROM of the left knee to 5-115 degrees flexion    Time  8    Period  Weeks    Status  New  PT LONG TERM GOAL #4   Title  decrease pain 50%    Period  Weeks    Status  New      PT LONG TERM GOAL #5   Title  decrease TUG to 16 seconds    Time  8    Period  Weeks    Status  New            Plan - 08/11/19 1525    Clinical Impression Statement  Patient tolerated TE well without reports of pain in knee except briefly with standing hip ABD. She also could not tolerate retrograde massage, so tape was applied. She did not like vaso. She will ice at home today.    Comorbidities  GERD, anemia, THR, TKR, RC repair, SOB    PT Treatment/Interventions  ADLs/Self Care Home Management;Electrical Stimulation;Cryotherapy;Ultrasound;Therapeutic activities;Therapeutic exercise;Patient/family education;Manual techniques;Stair training;Gait training;Functional mobility training;Balance training;Vasopneumatic Device    PT Next Visit Plan  assess tape for edema; slowly start rehab for TKR    PT Home Exercise Plan  [id number]    Consulted and Agree with Plan of Care  Patient       Patient will benefit from skilled therapeutic intervention in order to improve the following deficits and impairments:  Abnormal gait, Pain, Decreased scar mobility, Decreased mobility, Cardiopulmonary status limiting activity, Decreased activity tolerance, Decreased endurance, Decreased range of motion, Decreased strength, Increased edema, Difficulty walking, Decreased balance  Visit Diagnosis: Stiffness of left knee, not elsewhere classified  Acute pain of left knee  Difficulty in walking, not elsewhere classified  Localized edema     Problem List Patient Active Problem List   Diagnosis Date Noted  . Cameron ulcer, acute   . Gastric polyp   . Acute deep vein thrombosis (DVT) of calf muscle vein of left lower extremity   . Hypotension due to hypovolemia 08/04/2019  . Leg edema, left  08/04/2019  . Deficiency anemia 08/04/2019  . Symptomatic anemia 08/04/2019  . OA (osteoarthritis) of knee 07/26/2019  . Lung nodule 06/24/2019  . Bilateral leg edema 06/08/2019  . Zinc deficiency 10/03/2017  . Spondylosis, lumbar, with myelopathy 04/09/2017  . Hyperglycemia 03/05/2016  . Melena 08/03/2015  . GERD (gastroesophageal reflux disease) 08/03/2015  . Hemolytic anemia ([place]) 07/12/2015  . Dysuria 05/15/2015  . Chronic venous insufficiency 08/10/2014  . Aortic stenosis, mild 06/29/2014  . Left ventricular diastolic dysfunction, NYHA class 1 06/29/2014  . Senile osteopenia 04/21/2014  . Hyperlipidemia with target LDL less than 130 04/21/2014  . Kidney disease, chronic, stage III (GFR 30-59 ml/min) ([place]) 04/21/2014  . Routine health maintenance 07/16/2012  . Carotid artery stenosis without cerebral infarction 01/16/2010  . Essential hypertension 10/19/2007    Madelyn Flavors PT 08/11/2019, 3:28 PM  [place] [address] [place], [place], [address] Phone: [phone]   Fax:  [phone]  Name: NGAN HEFLIN MRN: [id number] Date of Birth: 08/26/1932

## 2019-08-12 ENCOUNTER — Encounter: Payer: Self-pay | Admitting: Internal Medicine

## 2019-08-12 NOTE — Progress Notes (Signed)
hyperplastic polyp - no recall

## 2019-08-16 ENCOUNTER — Ambulatory Visit: Payer: Medicare Other | Admitting: Physical Therapy

## 2019-08-16 ENCOUNTER — Encounter: Payer: Self-pay | Admitting: Physical Therapy

## 2019-08-16 ENCOUNTER — Other Ambulatory Visit: Payer: Self-pay

## 2019-08-16 DIAGNOSIS — R600 Localized edema: Secondary | ICD-10-CM

## 2019-08-16 DIAGNOSIS — M25562 Pain in left knee: Secondary | ICD-10-CM

## 2019-08-16 DIAGNOSIS — M25662 Stiffness of left knee, not elsewhere classified: Secondary | ICD-10-CM

## 2019-08-16 DIAGNOSIS — R262 Difficulty in walking, not elsewhere classified: Secondary | ICD-10-CM | POA: Diagnosis not present

## 2019-08-16 NOTE — Therapy (Signed)
[place], [place], [address] Phone: [phone]   Fax:  [phone]  Physical Therapy Treatment  Patient Details  Name: Claudia Lara MRN: [id number] Date of Birth: 12-07-1931 Referring Provider (PT): Alusio   Encounter Date: 08/16/2019  PT End of Session - 08/16/19 1507    Visit Number  3    Date for PT Re-Evaluation  10/09/19    PT Start Time  3614    PT Stop Time  1522    PT Time Calculation (min)  51 min    Activity Tolerance  Patient tolerated treatment well;Patient limited by pain    Behavior During Therapy  [facility] for tasks assessed/performed       Past Medical History:  Diagnosis Date  . ANEMIA 08/22/2009  . Aortic stenosis   . AR (aortic regurgitation) 01/2019   Mild to Moderate, Noted on ECHO  . CAROTID ARTERY STENOSIS 01/16/2010  . Complication of anesthesia    very hard to wake up from surgery  . DYSPNEA ON EXERTION 11/16/2007  . Gallstones   . GEN OSTEOARTHROSIS INVOLVING MULTIPLE SITES 11/11/2008  . GERD (gastroesophageal reflux disease)   . Grade I diastolic dysfunction 43/1540   Noted on ECHO  . Heart murmur   . HEMORRHOIDS, INTERNAL    surgery was in the 90's (per patient)  . History of blood transfusion    after hip and knee replacements  . NECK PAIN, ACUTE 12/28/2009  . PERIPHERAL EDEMA 10/06/2007  . PONV (postoperative nausea and vomiting)   . Pulmonary nodule 06/18/2019   noted on CT Chest   . PVD 10/06/2007  . Unspecified essential hypertension 10/19/2007    Past Surgical History:  Procedure Laterality Date  . ABDOMINAL HYSTERECTOMY    . BIOPSY  08/06/2019   Procedure: BIOPSY;  Surgeon: Gatha Mayer, MD;  Location: [facility];  Service: Gastroenterology;;  . [name]     bilat  . CARPAL TUNNEL RELEASE    . CHOLECYSTECTOMY  01/29/2016   at [facility]  . CHOLECYSTECTOMY    . ENDOSCOPIC RETROGRADE CHOLANGIOPANCREATOGRAPHY (ERCP)  WITH PROPOFOL N/A 12/01/2015   Procedure: ENDOSCOPIC RETROGRADE CHOLANGIOPANCREATOGRAPHY (ERCP) WITH PROPOFOL;  Surgeon: [name], MD;  [facility];  Service: Endoscopy;  Laterality: N/A;  . ENDOSCOPIC RETROGRADE CHOLANGIOPANCREATOGRAPHY (ERCP) WITH PROPOFOL N/A 02/01/2016   Procedure: ENDOSCOPIC RETROGRADE CHOLANGIOPANCREATOGRAPHY (ERCP) WITH PROPOFOL;  Surgeon: [name], MD;  [facility];  Service: Endoscopy;  Laterality: N/A;  . ENDOSCOPIC RETROGRADE CHOLANGIOPANCREATOGRAPHY (ERCP) WITH PROPOFOL N/A 04/07/2018   Procedure: ENDOSCOPIC RETROGRADE CHOLANGIOPANCREATOGRAPHY (ERCP) WITH PROPOFOL;  Surgeon: [name], MD;  [facility];  Service: Endoscopy;  Laterality: N/A;  . ESOPHAGOGASTRODUODENOSCOPY (EGD) WITH PROPOFOL N/A 08/06/2019   Procedure: ESOPHAGOGASTRODUODENOSCOPY (EGD) WITH PROPOFOL;  Surgeon: Gatha Mayer, MD;  [facility];  Service: Gastroenterology;  Laterality: N/A;  . HAMMER TOE SURGERY    . HEMORRHOID SURGERY    . Hyperplastic colon polyps, removed  2007   By Dr. Penelope Coop  . JOINT REPLACEMENT  2011   right knee  . OOPHORECTOMY    . REMOVAL OF STONES  04/07/2018   Procedure: REMOVAL OF STONES;  Surgeon: [name], MD;  [facility];  Service: Endoscopy;;  . ROTATOR CUFF REPAIR  2004   left (Dr. Durward Fortes)  . SPHINCTEROTOMY  04/07/2018   Procedure: SPHINCTEROTOMY;  Surgeon: [name], MD;  Location: [facility];  Service: Endoscopy;;  .  TOTAL HIP ARTHROPLASTY Right 11/26/2016   Procedure: RIGHT TOTAL HIP ARTHROPLASTY;  Surgeon: Garald Balding, MD;  Location: [facility];  Service: Orthopedics;  Laterality: Right;  . TOTAL KNEE ARTHROPLASTY Right   . TOTAL KNEE ARTHROPLASTY Left 07/26/2019   Procedure: TOTAL KNEE ARTHROPLASTY;  Surgeon: Gaynelle Arabian, MD;  [facility];  Service: Orthopedics;  Laterality: Left;  110mn    There were no vitals filed for this visit.  Subjective Assessment -  08/16/19 1439    Subjective  I am just doing so much, the swelling is not going down, I know I am moving better    Currently in Pain?  Yes    Pain Score  2     Pain Location  Knee    Pain Orientation  Left    Aggravating Factors   being so active         [facility] PT Assessment - 08/16/19 0001      AROM   Left Knee Extension  15    Left Knee Flexion  93      PROM   Left Knee Flexion  100                   [facility] Adult PT Treatment/Exercise - 08/16/19 0001      Ambulation/Gait   Gait Comments  worked on gait with a SPC, some cues for sequencing, she did well but needed cues for step length , I raised the walker hegiht as it is too low for her and causes poor posture      Knee/Hip Exercises: Aerobic   Nustep  level 4 x 6 minutes      Knee/Hip Exercises: Machines for Strengthening   Cybex Knee Flexion  20# 2x10      Knee/Hip Exercises: Seated   Long Arc Quad  20 reps;Left    Heel Slides  Left;2 sets;10 reps    Heel Slides Limitations  working on more flexion and having her hold 15 sedonds      Knee/Hip Exercises: Supine   Quad Sets  Left;2 sets;10 reps    Short Arc Quad Sets  Left;2 sets;10 reps    Short Arc Quad Sets Limitations  2#      Vasopneumatic   Number Minutes Vasopneumatic   10 minutes    Vasopnuematic Location   Knee    Vasopneumatic Pressure  Medium    Vasopneumatic Temperature   39      Manual Therapy   Manual Therapy  Passive ROM;Soft tissue mobilization    Soft tissue mobilization  scar mobilization    Passive ROM  PROM of the left knee flexion and extnension               PT Short Term Goals - 08/09/19 1510      PT SHORT TERM GOAL #1   Title  independent with initial HEP    Time  2    Period  Weeks    Status  New        PT Long Term Goals - 08/16/19 1514      PT LONG TERM GOAL #1   Title  walk 200 feet with SPC or no device    Status  On-going      PT LONG TERM GOAL #2   Title  go up her steps step over step     Status  On-going      PT LONG TERM GOAL #3   Title  increase AROM  of the left knee to 5-115 degrees flexion    Status  Partially Met            Plan - 08/16/19 1512    Clinical Impression Statement  Patient again with difficulty with the vaso, however she is very swollen, I do feel like she is very active and does not rest, she talks aout using ice but is in a recliner, I talked with her about elevation.  She did well with the cane and I told her she could switch to this in the home    PT Next Visit Plan  continue to work on ROM, and gait, she really seems to no like the vaso    Consulted and Agree with Plan of Care  Patient       Patient will benefit from skilled therapeutic intervention in order to improve the following deficits and impairments:  Abnormal gait, Pain, Decreased scar mobility, Decreased mobility, Cardiopulmonary status limiting activity, Decreased activity tolerance, Decreased endurance, Decreased range of motion, Decreased strength, Increased edema, Difficulty walking, Decreased balance  Visit Diagnosis: Stiffness of left knee, not elsewhere classified  Acute pain of left knee  Difficulty in walking, not elsewhere classified  Localized edema     Problem List Patient Active Problem List   Diagnosis Date Noted  . Cameron ulcer, acute   . Gastric polyp   . Acute deep vein thrombosis (DVT) of calf muscle vein of left lower extremity   . Hypotension due to hypovolemia 08/04/2019  . Leg edema, left 08/04/2019  . Deficiency anemia 08/04/2019  . Symptomatic anemia 08/04/2019  . OA (osteoarthritis) of knee 07/26/2019  . Lung nodule 06/24/2019  . Bilateral leg edema 06/08/2019  . Zinc deficiency 10/03/2017  . Spondylosis, lumbar, with myelopathy 04/09/2017  . Hyperglycemia 03/05/2016  . Melena 08/03/2015  . GERD (gastroesophageal reflux disease) 08/03/2015  . Hemolytic anemia ([place]) 07/12/2015  . Dysuria 05/15/2015  . Chronic venous insufficiency  08/10/2014  . Aortic stenosis, mild 06/29/2014  . Left ventricular diastolic dysfunction, NYHA class 1 06/29/2014  . Senile osteopenia 04/21/2014  . Hyperlipidemia with target LDL less than 130 04/21/2014  . Kidney disease, chronic, stage III (GFR 30-59 ml/min) ([place]) 04/21/2014  . Routine health maintenance 07/16/2012  . Carotid artery stenosis without cerebral infarction 01/16/2010  . Essential hypertension 10/19/2007    Sumner Boast., PT 08/16/2019, 3:15 PM  [place] [address] [place], [place], [address] Phone: [phone]   Fax:  [phone]  Name: Claudia Lara MRN: [id number] Date of Birth: Jun 06, 1932

## 2019-08-18 ENCOUNTER — Other Ambulatory Visit (INDEPENDENT_AMBULATORY_CARE_PROVIDER_SITE_OTHER): Payer: Medicare Other

## 2019-08-18 ENCOUNTER — Encounter: Payer: Self-pay | Admitting: Internal Medicine

## 2019-08-18 ENCOUNTER — Telehealth (HOSPITAL_COMMUNITY): Payer: Self-pay | Admitting: Rehabilitation

## 2019-08-18 ENCOUNTER — Ambulatory Visit: Payer: Medicare Other | Admitting: Physical Therapy

## 2019-08-18 ENCOUNTER — Other Ambulatory Visit: Payer: Self-pay | Admitting: Internal Medicine

## 2019-08-18 ENCOUNTER — Ambulatory Visit (INDEPENDENT_AMBULATORY_CARE_PROVIDER_SITE_OTHER): Payer: Medicare Other | Admitting: Internal Medicine

## 2019-08-18 ENCOUNTER — Encounter: Payer: Self-pay | Admitting: Physical Therapy

## 2019-08-18 ENCOUNTER — Other Ambulatory Visit: Payer: Self-pay

## 2019-08-18 VITALS — BP 130/50 | HR 83 | Temp 98.3°F | Ht 63.0 in | Wt 172.0 lb

## 2019-08-18 DIAGNOSIS — R262 Difficulty in walking, not elsewhere classified: Secondary | ICD-10-CM

## 2019-08-18 DIAGNOSIS — R600 Localized edema: Secondary | ICD-10-CM | POA: Diagnosis not present

## 2019-08-18 DIAGNOSIS — D539 Nutritional anemia, unspecified: Secondary | ICD-10-CM

## 2019-08-18 DIAGNOSIS — I824Z2 Acute embolism and thrombosis of unspecified deep veins of left distal lower extremity: Secondary | ICD-10-CM | POA: Insufficient documentation

## 2019-08-18 DIAGNOSIS — M25662 Stiffness of left knee, not elsewhere classified: Secondary | ICD-10-CM

## 2019-08-18 DIAGNOSIS — M25562 Pain in left knee: Secondary | ICD-10-CM | POA: Diagnosis not present

## 2019-08-18 LAB — CBC WITH DIFFERENTIAL/PLATELET
Basophils Absolute: 0.1 10*3/uL (ref 0.0–0.1)
Basophils Relative: 0.9 % (ref 0.0–3.0)
Eosinophils Absolute: 0.3 10*3/uL (ref 0.0–0.7)
Eosinophils Relative: 3.9 % (ref 0.0–5.0)
HCT: 26.6 % — ABNORMAL LOW (ref 36.0–46.0)
Hemoglobin: 9.2 g/dL — ABNORMAL LOW (ref 12.0–15.0)
Lymphocytes Relative: 12.9 % (ref 12.0–46.0)
Lymphs Abs: 0.9 10*3/uL (ref 0.7–4.0)
MCHC: 34.6 g/dL (ref 30.0–36.0)
MCV: 90.1 fl (ref 78.0–100.0)
Monocytes Absolute: 0.6 10*3/uL (ref 0.1–1.0)
Monocytes Relative: 8.2 % (ref 3.0–12.0)
Neutro Abs: 5.3 10*3/uL (ref 1.4–7.7)
Neutrophils Relative %: 74.1 % (ref 43.0–77.0)
Platelets: 320 10*3/uL (ref 150.0–400.0)
RBC: 2.95 Mil/uL — ABNORMAL LOW (ref 3.87–5.11)
RDW: 17.8 % — ABNORMAL HIGH (ref 11.5–15.5)
WBC: 7.2 10*3/uL (ref 4.0–10.5)

## 2019-08-18 LAB — FERRITIN: Ferritin: 445.9 ng/mL — ABNORMAL HIGH (ref 10.0–291.0)

## 2019-08-18 LAB — D-DIMER, QUANTITATIVE: D-Dimer, Quant: 4.77 mcg/mL FEU — ABNORMAL HIGH (ref <0.50)

## 2019-08-18 LAB — IBC PANEL
Iron: 106 ug/dL (ref 42–145)
Saturation Ratios: 50.5 % — ABNORMAL HIGH (ref 20.0–50.0)
Transferrin: 150 mg/dL — ABNORMAL LOW (ref 212.0–360.0)

## 2019-08-18 NOTE — Patient Instructions (Signed)

## 2019-08-18 NOTE — Telephone Encounter (Signed)
I attempted to contact this Claudia Lara to schedule an appointment for follow-up ultrasound ordered by Dr. [name]. I was unsuccessful reaching the patient, so I left a message requesting a call back to schedule appointment.

## 2019-08-18 NOTE — Therapy (Signed)
[place] [address] [place], [place], [address] Phone: [phone]   Fax:  [phone]  Physical Therapy Treatment  Patient Details  Name: Claudia Lara MRN: [id number] Date of Birth: [birth year] Referring Provider (PT): Alusio   Encounter Date: 08/18/2019  PT End of Session - 08/18/19 1520    Visit Number  4    Date for PT Re-Evaluation  10/09/19    PT Start Time  1430    PT Stop Time  1515    PT Time Calculation (min)  45 min    Activity Tolerance  Patient tolerated treatment well    Behavior During Therapy  [facility] for tasks assessed/performed       Past Medical History:  Diagnosis Date  . ANEMIA 08/22/2009  . Aortic stenosis   . AR (aortic regurgitation) 01/2019   Mild to Moderate, Noted on ECHO  . CAROTID ARTERY STENOSIS 01/16/2010  . Complication of anesthesia    very hard to wake up from surgery  . DYSPNEA ON EXERTION 11/16/2007  . Gallstones   . GEN OSTEOARTHROSIS INVOLVING MULTIPLE SITES 11/11/2008  . GERD (gastroesophageal reflux disease)   . Grade I diastolic dysfunction 123456   Noted on ECHO  . Heart murmur   . HEMORRHOIDS, INTERNAL    surgery was in the 90's (per patient)  . History of blood transfusion    after hip and knee replacements  . NECK PAIN, ACUTE 12/28/2009  . PERIPHERAL EDEMA 10/06/2007  . PONV (postoperative nausea and vomiting)   . Pulmonary nodule 06/18/2019   noted on CT Chest   . PVD 10/06/2007  . Unspecified essential hypertension 10/19/2007    Past Surgical History:  Procedure Laterality Date  . ABDOMINAL HYSTERECTOMY    . BIOPSY  08/06/2019   Procedure: BIOPSY;  Surgeon: Gatha Mayer, MD;  Location: [facility];  Service: Gastroenterology;;  . [name]     bilat  . CARPAL TUNNEL RELEASE    . CHOLECYSTECTOMY  01/29/2016   at [facility]  . CHOLECYSTECTOMY    . ENDOSCOPIC RETROGRADE CHOLANGIOPANCREATOGRAPHY (ERCP) WITH PROPOFOL N/A 12/01/2015    Procedure: ENDOSCOPIC RETROGRADE CHOLANGIOPANCREATOGRAPHY (ERCP) WITH PROPOFOL;  Surgeon: [name], MD;  Location: [facility];  Service: Endoscopy;  Laterality: N/A;  . ENDOSCOPIC RETROGRADE CHOLANGIOPANCREATOGRAPHY (ERCP) WITH PROPOFOL N/A 02/01/2016   Procedure: ENDOSCOPIC RETROGRADE CHOLANGIOPANCREATOGRAPHY (ERCP) WITH PROPOFOL;  Surgeon: [name], MD;  Location: [facility];  Service: Endoscopy;  Laterality: N/A;  . ENDOSCOPIC RETROGRADE CHOLANGIOPANCREATOGRAPHY (ERCP) WITH PROPOFOL N/A 04/07/2018   Procedure: ENDOSCOPIC RETROGRADE CHOLANGIOPANCREATOGRAPHY (ERCP) WITH PROPOFOL;  Surgeon: [name], MD;  Location: [facility];  Service: Endoscopy;  Laterality: N/A;  . ESOPHAGOGASTRODUODENOSCOPY (EGD) WITH PROPOFOL N/A 08/06/2019   Procedure: ESOPHAGOGASTRODUODENOSCOPY (EGD) WITH PROPOFOL;  Surgeon: Gatha Mayer, MD;  Location: [facility];  Service: Gastroenterology;  Laterality: N/A;  . HAMMER TOE SURGERY    . HEMORRHOID SURGERY    . Hyperplastic colon polyps, removed  2007   By Dr. Penelope Coop  . JOINT REPLACEMENT  2011   right knee  . OOPHORECTOMY    . REMOVAL OF STONES  04/07/2018   Procedure: REMOVAL OF STONES;  Surgeon: [name], MD;  Location: [facility];  Service: Endoscopy;;  . ROTATOR CUFF REPAIR  2004   left (Dr. Durward Fortes)  . SPHINCTEROTOMY  04/07/2018   Procedure: SPHINCTEROTOMY;  Surgeon: [name], MD;  Location: [facility];  Service: Endoscopy;;  . TOTAL HIP  ARTHROPLASTY Right 11/26/2016   Procedure: RIGHT TOTAL HIP ARTHROPLASTY;  Surgeon: Garald Balding, MD;  Location: [facility];  Service: Orthopedics;  Laterality: Right;  . TOTAL KNEE ARTHROPLASTY Right   . TOTAL KNEE ARTHROPLASTY Left 07/26/2019   Procedure: TOTAL KNEE ARTHROPLASTY;  Surgeon: Gaynelle Arabian, MD;  Location: [facility];  Service: Orthopedics;  Laterality: Left;  81min    There were no vitals filed for this visit.  Subjective Assessment - 08/18/19 1434    Subjective  "I  am doing just fine"    Limitations  Lifting;Standing;Walking;House hold activities    Currently in Pain?  No/denies    Pain Score  0-No pain         [facility] PT Assessment - 08/18/19 0001      PROM   Left Knee Extension  10    Left Knee Flexion  109                   [facility] Adult PT Treatment/Exercise - 08/18/19 0001      Ambulation/Gait   Gait Comments  gait with SPC 3 x 100' and then needing to rest, at times needs cues for sequencing, stiff leg at times      Knee/Hip Exercises: Aerobic   Recumbent Bike  bike 4 minutes partial revolutions limited by ROM and pain    Nustep  level 4 x 6 minutes      Knee/Hip Exercises: Machines for Strengthening   Cybex Knee Flexion  20# 2x10      Knee/Hip Exercises: Standing   Heel Raises  Both;10 reps    Hip Flexion  Both;2 sets;10 reps    Hip Flexion Limitations  some cues to bend the knee      Knee/Hip Exercises: Seated   Long Arc Quad  20 reps;Left    Long Arc Quad Limitations  3#    Heel Slides  Left;2 sets;10 reps    Heel Slides Limitations  working on more flexion and having her hold 25 sedonds      Knee/Hip Exercises: Supine   Short Arc Quad Sets  Left;2 sets;10 reps    Short Arc Quad Sets Limitations  3#      Manual Therapy   Manual Therapy  Passive ROM;Soft tissue mobilization    Soft tissue mobilization  scar mobilization    Passive ROM  PROM of the left knee flexion and extnension             PT Education - 08/18/19 1519    Education Details  low load long duration stretch with focus on flexion    Person(s) Educated  Patient    Methods  Explanation;Demonstration;Tactile cues;Verbal cues    Comprehension  Verbalized understanding;Returned demonstration;Verbal cues required       PT Short Term Goals - 08/18/19 1524      PT SHORT TERM GOAL #1   Title  independent with initial HEP    Status  Achieved        PT Long Term Goals - 08/18/19 1524      PT LONG TERM GOAL #1   Title  walk 200 feet  with SPC or no device    Status  On-going            Plan - 08/18/19 1521    Clinical Impression Statement  Patient does not like the vaso.  She is now going to try walking exclusively with the cane, she does well , at times does need cues to  bend the knee and she does have to rest.  PROM is improved with less pain, she cannot go around on the bike.    PT Next Visit Plan  work on the ROM and her functional ability    Consulted and Agree with Plan of Care  Patient       Patient will benefit from skilled therapeutic intervention in order to improve the following deficits and impairments:  Abnormal gait, Pain, Decreased scar mobility, Decreased mobility, Cardiopulmonary status limiting activity, Decreased activity tolerance, Decreased endurance, Decreased range of motion, Decreased strength, Increased edema, Difficulty walking, Decreased balance  Visit Diagnosis: Stiffness of left knee, not elsewhere classified  Acute pain of left knee  Difficulty in walking, not elsewhere classified     Problem List Patient Active Problem List   Diagnosis Date Noted  . Lower leg DVT (deep venous thromboembolism), acute, left ([place]) 08/18/2019  . Cameron ulcer, acute   . Gastric polyp   . Acute deep vein thrombosis (DVT) of calf muscle vein of left lower extremity   . Hypotension due to hypovolemia 08/04/2019  . Deficiency anemia 08/04/2019  . OA (osteoarthritis) of knee 07/26/2019  . Lung nodule 06/24/2019  . Bilateral leg edema 06/08/2019  . Zinc deficiency 10/03/2017  . Spondylosis, lumbar, with myelopathy 04/09/2017  . Hyperglycemia 03/05/2016  . Melena 08/03/2015  . GERD (gastroesophageal reflux disease) 08/03/2015  . Hemolytic anemia ([place]) 07/12/2015  . Chronic venous insufficiency 08/10/2014  . Aortic stenosis, mild 06/29/2014  . Left ventricular diastolic dysfunction, NYHA class 1 06/29/2014  . Senile osteopenia 04/21/2014  . Hyperlipidemia with target LDL less than 130  04/21/2014  . Kidney disease, chronic, stage III (GFR 30-59 ml/min) ([place]) 04/21/2014  . Routine health maintenance 07/16/2012  . Carotid artery stenosis without cerebral infarction 01/16/2010  . Essential hypertension 10/19/2007    Sumner Boast., PT 08/18/2019, 3:24 PM  [address] [place], [place], [address] Phone: [phone]   Fax:  [phone]  Name: Claudia Lara MRN: [id number] Date of Birth: [birth year]

## 2019-08-18 NOTE — Progress Notes (Signed)
Subjective:  Patient ID: Claudia Lara, female    DOB: 01/08/1932  Age: 85 y.o. MRN: [id number]  CC: Anemia   HPI Claudia Lara presents for f/up - She was recently admitted for symptomatic anemia status post left total knee replacement.  During the admission she was found to have a DVT in her left calf isolated to the gastrocnemius.  It was decided that she would not be anticoagulated.  She tells me the swelling in the left lower extremity has not improved much.  She denies claudication, chest pain, shortness of breath, or hemoptysis.  Outpatient Medications Prior to Visit  Medication Sig Dispense Refill  . aspirin 325 MG EC tablet Take 1 tablet (325 mg total) by mouth daily. 40 tablet 0  . Calcium Carb-Cholecalciferol (CALCIUM CARBONATE-VITAMIN D3 PO) Take 1 tablet by mouth daily.     . carvedilol (COREG) 12.5 MG tablet TAKE 1 TABLET (12.5 MG TOTAL) BY MOUTH 2 (TWO) TIMES DAILY WITH A MEAL. 180 tablet 1  . cholecalciferol (VITAMIN D) 1000 units tablet Take 1,000 Units by mouth daily.    . ferrous sulfate 325 (65 FE) MG tablet Take 1 tablet (325 mg total) by mouth 2 (two) times daily with a meal. 40 tablet 0  . gabapentin (NEURONTIN) 100 MG capsule Take 1 capsule (100 mg total) by mouth 3 (three) times daily. 1 tab 3x/day for 2 weeks. 1 tab 2x/day for 2 weeks. 1 tab a day for 2 weeks 84 capsule 0  . Multiple Vitamin (MULTIVITAMIN WITH MINERALS) TABS tablet Take 1 tablet by mouth daily.    [name] oxyCODONE (OXY IR/ROXICODONE) 5 MG immediate release tablet Take 1-2 tablets (5-10 mg total) by mouth every 4 (four) hours as needed for severe pain. 56 tablet 0  . pantoprazole (PROTONIX) 40 MG tablet Take 1 tablet (40 mg total) by mouth daily. 90 tablet 1  . torsemide (DEMADEX) 20 MG tablet Take 1 tablet (20 mg total) by mouth daily. 90 tablet 1  . traMADol (ULTRAM) 50 MG tablet Take 1-2 tablets (50-100 mg total) by mouth every 12 (twelve) hours as needed for moderate pain. 56 tablet 0  . vitamin C  (ASCORBIC ACID) 500 MG tablet Take 500 mg by mouth daily.    [name] zinc gluconate 50 MG tablet Take 1 tablet (50 mg total) daily by mouth. 90 tablet 1  . Artificial Tear Solution (SOOTHE XP OP) Place 1 drop into both eyes 2 (two) times daily.    . methocarbamol (ROBAXIN) 500 MG tablet Take 1 tablet (500 mg total) by mouth every 6 (six) hours as needed for muscle spasms. 40 tablet 0  . OVER THE COUNTER MEDICATION Apply 1 application topically daily as needed (lower back.). hemp freeze gel      No facility-administered medications prior to visit.     ROS Review of Systems  Constitutional: Negative for diaphoresis and fatigue.  HENT: Negative.   Eyes: Negative for visual disturbance.  Respiratory: Negative for cough, chest tightness, shortness of breath and wheezing.   Cardiovascular: Positive for leg swelling. Negative for chest pain and palpitations.  Gastrointestinal: Negative for abdominal pain, blood in stool, constipation, diarrhea, nausea and vomiting.  Endocrine: Negative.   Genitourinary: Negative.  Negative for difficulty urinating.  Musculoskeletal: Negative.   Skin: Negative.  Negative for pallor.  Neurological: Negative for dizziness, weakness and light-headedness.  Hematological: Negative for adenopathy. Does not bruise/bleed easily.  Psychiatric/Behavioral: Negative.     Objective:  BP (!) 130/50 (BP Location:  Left Arm, Patient Position: Sitting, Cuff Size: Normal)   Pulse 83   Temp 98.3 F (36.8 C) (Oral)   Ht 5\' 3"  (1.6 m)   Wt 172 lb (78 kg)   SpO2 98%   BMI 30.47 kg/m   BP Readings from Last 3 Encounters:  08/18/19 (!) 130/50  08/06/19 (!) 136/57  08/04/19 (!) 120/10    Wt Readings from Last 3 Encounters:  08/18/19 172 lb (78 kg)  08/06/19 173 lb 1 oz (78.5 kg)  08/04/19 171 lb (77.6 kg)    Physical Exam Vitals signs reviewed.  HENT:     Nose: Nose normal.     Mouth/Throat:     Mouth: Mucous membranes are moist. Mucous membranes are pale.  Eyes:      General: No scleral icterus.    Conjunctiva/sclera: Conjunctivae normal.  Neck:     Musculoskeletal: Normal range of motion and neck supple.  Cardiovascular:     Rate and Rhythm: Normal rate and regular rhythm.     Heart sounds: Murmur present. Systolic murmur present with a grade of 2/6. No gallop.   Pulmonary:     Breath sounds: No stridor. No wheezing, rhonchi or rales.  Abdominal:     General: Abdomen is flat.     Palpations: There is no mass.     Tenderness: There is no abdominal tenderness.  Musculoskeletal:     Right lower leg: No edema.     Left lower leg: 2+ Pitting Edema present.  Skin:    General: Skin is warm and dry.  Neurological:     General: No focal deficit present.     Mental Status: She is alert.  Psychiatric:        Mood and Affect: Mood normal.        Behavior: Behavior normal.     Lab Results  Component Value Date   WBC 7.2 08/18/2019   HGB 9.2 (L) 08/18/2019   HCT 26.6 Repeated and verified X2. (L) 08/18/2019   PLT 320.0 08/18/2019   GLUCOSE 118 (H) 08/06/2019   CHOL 120 06/08/2019   TRIG 104.0 06/08/2019   HDL 53.90 06/08/2019   LDLDIRECT 56.0 07/08/2018   LDLCALC 45 06/08/2019   ALT 11 08/06/2019   AST 14 (L) 08/06/2019   NA 140 08/06/2019   K 3.6 08/06/2019   CL 103 08/06/2019   CREATININE 1.44 (H) 08/06/2019   BUN 22 08/06/2019   CO2 25 08/06/2019   TSH 1.12 08/04/2019   INR 1.1 07/21/2019   HGBA1C 4.8 06/08/2019    Vas [place] Lower Extremity Venous (dvt) (only [facility])  Result Date: 08/04/2019  Lower Venous Study Indications: Pain, Swelling, and total left knee replacement 07/26/2019.  Limitations: Body habitus and poor ultrasound/tissue interface. Comparison Study: No recent prior study. Performing Technologist: Maudry Mayhew MHA, RDMS, RVT, RDCS  Examination Guidelines: A complete evaluation includes B-mode imaging, spectral Doppler, color Doppler, and power Doppler as needed of all accessible portions of each vessel. Bilateral  testing is considered an integral part of a complete examination. Limited examinations for reoccurring indications may be performed as noted.  +---------+---------------+---------+-----------+----------+--------------+ LEFT     CompressibilityPhasicitySpontaneityPropertiesThrombus Aging +---------+---------------+---------+-----------+----------+--------------+ CFV      Full           Yes      Yes                                 +---------+---------------+---------+-----------+----------+--------------+  SFJ      Full                                                        +---------+---------------+---------+-----------+----------+--------------+ FV Prox  Full                                                        +---------+---------------+---------+-----------+----------+--------------+ FV Mid   Full                                                        +---------+---------------+---------+-----------+----------+--------------+ FV DistalFull                                                        +---------+---------------+---------+-----------+----------+--------------+ PFV      Full                                                        +---------+---------------+---------+-----------+----------+--------------+ POP      Full           Yes      Yes                                 +---------+---------------+---------+-----------+----------+--------------+ PTV      Full                                                        +---------+---------------+---------+-----------+----------+--------------+ PERO     Full                                                        +---------+---------------+---------+-----------+----------+--------------+ Gastroc  None                    No                   Acute          +---------+---------------+---------+-----------+----------+--------------+  Unable to visualize right common femoral vein due to  patient position.  Summary: Left: Findings consistent with acute deep vein thrombosis involving the left gastrocnemius veins. No cystic structure found in the popliteal fossa.  *See table(s) above for measurements and observations. Electronically signed by Curt Jews MD on 08/04/2019 at 4:46:26 PM.    Final  Assessment & Plan:   Lousie was seen today for anemia.  Diagnoses and all orders for this visit:  Deficiency anemia- Her H&H have improved.  Her iron level is normal.  She does not need another infusion. -     CBC with Differential/Platelet; Future -     IBC panel; Future -     Ferritin; Future  Lower leg DVT (deep venous thromboembolism), acute, left ([place])- Her d-dimer remains mildly elevated.  I have asked her to undergo another ultrasound to be certain that the DVT is not advancing proximally. -     D-dimer, quantitative (not at [facility]); Future -     VAS [place] LOWER EXTREMITY VENOUS (DVT); Future   I am having Claudia Lara maintain her vitamin C, cholecalciferol, zinc gluconate, Calcium Carb-Cholecalciferol (CALCIUM CARBONATE-VITAMIN D3 PO), pantoprazole, torsemide, multivitamin with minerals, carvedilol, oxyCODONE, traMADol, gabapentin, ferrous sulfate, and aspirin.  No orders of the defined types were placed in this encounter.    Follow-up: Return in about 2 months (around 10/18/2019).  [name], MD

## 2019-08-19 ENCOUNTER — Telehealth (HOSPITAL_COMMUNITY): Payer: Self-pay | Admitting: *Deleted

## 2019-08-19 ENCOUNTER — Ambulatory Visit: Payer: Medicare Other | Admitting: Physical Therapy

## 2019-08-19 DIAGNOSIS — R600 Localized edema: Secondary | ICD-10-CM | POA: Diagnosis not present

## 2019-08-19 DIAGNOSIS — R262 Difficulty in walking, not elsewhere classified: Secondary | ICD-10-CM | POA: Diagnosis not present

## 2019-08-19 DIAGNOSIS — M25562 Pain in left knee: Secondary | ICD-10-CM

## 2019-08-19 DIAGNOSIS — M25662 Stiffness of left knee, not elsewhere classified: Secondary | ICD-10-CM

## 2019-08-19 NOTE — Therapy (Signed)
[place], [place], [address] Phone: [phone]   Fax:  [phone]  Physical Therapy Treatment  Patient Details  Name: Claudia Lara MRN: [id number] Date of Birth: 1932/10/03 Referring Provider (PT): Alusio   Encounter Date: 08/19/2019  PT End of Session - 08/19/19 1614    Visit Number  5    Date for PT Re-Evaluation  10/09/19    PT Start Time  [id number]    PT Stop Time  1612    PT Time Calculation (min)  42 min       Past Medical History:  Diagnosis Date  . ANEMIA 08/22/2009  . Aortic stenosis   . AR (aortic regurgitation) 01/2019   Mild to Moderate, Noted on ECHO  . CAROTID ARTERY STENOSIS 01/16/2010  . Complication of anesthesia    very hard to wake up from surgery  . DYSPNEA ON EXERTION 11/16/2007  . Gallstones   . GEN OSTEOARTHROSIS INVOLVING MULTIPLE SITES 11/11/2008  . GERD (gastroesophageal reflux disease)   . Grade I diastolic dysfunction 123456   Noted on ECHO  . Heart murmur   . HEMORRHOIDS, INTERNAL    surgery was in the 90's (per patient)  . History of blood transfusion    after hip and knee replacements  . NECK PAIN, ACUTE 12/28/2009  . PERIPHERAL EDEMA 10/06/2007  . PONV (postoperative nausea and vomiting)   . Pulmonary nodule 06/18/2019   noted on CT Chest   . PVD 10/06/2007  . Unspecified essential hypertension 10/19/2007    Past Surgical History:  Procedure Laterality Date  . ABDOMINAL HYSTERECTOMY    . BIOPSY  08/06/2019   Procedure: BIOPSY;  Surgeon: Gatha Mayer, MD;  Location: [facility];  Service: Gastroenterology;;  . [name]     bilat  . CARPAL TUNNEL RELEASE    . CHOLECYSTECTOMY  01/29/2016   at [facility]  . CHOLECYSTECTOMY    . ENDOSCOPIC RETROGRADE CHOLANGIOPANCREATOGRAPHY (ERCP) WITH PROPOFOL N/A 12/01/2015   Procedure: ENDOSCOPIC RETROGRADE CHOLANGIOPANCREATOGRAPHY (ERCP) WITH PROPOFOL;  Surgeon: [name], MD;   [facility];  Service: Endoscopy;  Laterality: N/A;  . ENDOSCOPIC RETROGRADE CHOLANGIOPANCREATOGRAPHY (ERCP) WITH PROPOFOL N/A 02/01/2016   Procedure: ENDOSCOPIC RETROGRADE CHOLANGIOPANCREATOGRAPHY (ERCP) WITH PROPOFOL;  Surgeon: [name], MD;  [facility];  Service: Endoscopy;  Laterality: N/A;  . ENDOSCOPIC RETROGRADE CHOLANGIOPANCREATOGRAPHY (ERCP) WITH PROPOFOL N/A 04/07/2018   Procedure: ENDOSCOPIC RETROGRADE CHOLANGIOPANCREATOGRAPHY (ERCP) WITH PROPOFOL;  Surgeon: [name], MD;  [facility];  Service: Endoscopy;  Laterality: N/A;  . ESOPHAGOGASTRODUODENOSCOPY (EGD) WITH PROPOFOL N/A 08/06/2019   Procedure: ESOPHAGOGASTRODUODENOSCOPY (EGD) WITH PROPOFOL;  Surgeon: Gatha Mayer, MD;  [facility];  Service: Gastroenterology;  Laterality: N/A;  . HAMMER TOE SURGERY    . HEMORRHOID SURGERY    . Hyperplastic colon polyps, removed  2007   By Dr. Penelope Coop  . JOINT REPLACEMENT  2011   right knee  . OOPHORECTOMY    . REMOVAL OF STONES  04/07/2018   Procedure: REMOVAL OF STONES;  Surgeon: [name], MD;  [facility];  Service: Endoscopy;;  . ROTATOR CUFF REPAIR  2004   left (Dr. Durward Fortes)  . SPHINCTEROTOMY  04/07/2018   Procedure: SPHINCTEROTOMY;  Surgeon: [name], MD;  Location: [facility];  Service: Endoscopy;;  . TOTAL HIP ARTHROPLASTY Right 11/26/2016   Procedure: RIGHT TOTAL HIP ARTHROPLASTY;  Surgeon: Garald Balding, MD;  Location: [place];  Service: Orthopedics;  Laterality: Right;  . TOTAL KNEE ARTHROPLASTY Right   . TOTAL KNEE ARTHROPLASTY Left 07/26/2019   Procedure: TOTAL KNEE ARTHROPLASTY;  Surgeon: Gaynelle Arabian, MD;  [facility];  Service: Orthopedics;  Laterality: Left;  28min    There were no vitals filed for this visit.  Subjective Assessment - 08/19/19 1530    Subjective  a bit sore - maybe from last workout or weather. seeing vein MD tuesday for swelling    Currently in Pain?  No/denies                        [facility] Adult PT Treatment/Exercise - 08/19/19 0001      Ambulation/Gait   Gait Comments  worked on gait with SPC bending knee with cuing and then self cuing      Knee/Hip Exercises: Aerobic   Recumbent Bike  bike 5 min full rev seat # 5    Nustep  L 4 6 min      Knee/Hip Exercises: Machines for Strengthening   Cybex Knee Extension  5# 2 sets 10 BIL   unable to do SL on left   Cybex Knee Flexion  20# 2x10 BIL   15# 10 reps Left only   Cybex Leg Press  20# 2 sets 10      Knee/Hip Exercises: Standing   Heel Raises  Both;15 reps   black bar   Hip Flexion  AROM;Left;2 sets;10 reps;Knee bent   2# 6 inch box step to increase knee flexion CGA for balance   Terminal Knee Extension  Left;15 reps;Theraband;2 sets   1 set sitting , 1 set standing   Theraband Level (Terminal Knee Extension)  Level 3 (Green)               PT Short Term Goals - 08/18/19 1524      PT SHORT TERM GOAL #1   Title  independent with initial HEP    Status  Achieved        PT Long Term Goals - 08/18/19 1524      PT LONG TERM GOAL #1   Title  walk 200 feet with SPC or no device    Status  On-going            Plan - 08/19/19 1615    Clinical Impression Statement  pt doing very well, able to make full rev on bike and added wt machines today. pt with great flexion but with walking reverts to stiff leg, worked on gait with cuing and self cuing. cues with ext to activate quads and get more TKE    PT Treatment/Interventions  ADLs/Self Care Home Management;Electrical Stimulation;Cryotherapy;Ultrasound;Therapeutic activities;Therapeutic exercise;Patient/family education;Manual techniques;Stair training;Gait training;Functional mobility training;Balance training;Vasopneumatic Device    PT Next Visit Plan  TKE, quad stength and gait       Patient will benefit from skilled therapeutic intervention in order to improve the following deficits and impairments:   Abnormal gait, Pain, Decreased scar mobility, Decreased mobility, Cardiopulmonary status limiting activity, Decreased activity tolerance, Decreased endurance, Decreased range of motion, Decreased strength, Increased edema, Difficulty walking, Decreased balance  Visit Diagnosis: Stiffness of left knee, not elsewhere classified  Acute pain of left knee  Difficulty in walking, not elsewhere classified     Problem List Patient Active Problem List   Diagnosis Date Noted  . Lower leg DVT (deep venous thromboembolism), acute, left ([place]) 08/18/2019  . [place] ulcer, acute   . Gastric polyp   .  Acute deep vein thrombosis (DVT) of calf muscle vein of left lower extremity   . Hypotension due to hypovolemia 08/04/2019  . Deficiency anemia 08/04/2019  . OA (osteoarthritis) of knee 07/26/2019  . Lung nodule 06/24/2019  . Bilateral leg edema 06/08/2019  . Zinc deficiency 10/03/2017  . Spondylosis, lumbar, with myelopathy 04/09/2017  . Hyperglycemia 03/05/2016  . Melena 08/03/2015  . GERD (gastroesophageal reflux disease) 08/03/2015  . Hemolytic anemia ([place]) 07/12/2015  . Chronic venous insufficiency 08/10/2014  . Aortic stenosis, mild 06/29/2014  . Left ventricular diastolic dysfunction, NYHA class 1 06/29/2014  . Senile osteopenia 04/21/2014  . Hyperlipidemia with target LDL less than 130 04/21/2014  . Kidney disease, chronic, stage III (GFR 30-59 ml/min) ([place]) 04/21/2014  . Routine health maintenance 07/16/2012  . Carotid artery stenosis without cerebral infarction 01/16/2010  . Essential hypertension 10/19/2007    Aj Crunkleton,ANGIE  PTA 08/19/2019, 4:17 PM  [place], [place], [address] Phone: [phone]   Fax:  [phone]  Name: Claudia Lara MRN: [id number] Date of Birth: [birth year]

## 2019-08-19 NOTE — Telephone Encounter (Signed)
Scheduled DVT study with patient at her earliest availability.

## 2019-08-23 ENCOUNTER — Encounter: Payer: Self-pay | Admitting: Physical Therapy

## 2019-08-23 ENCOUNTER — Ambulatory Visit: Payer: Medicare Other | Admitting: Physical Therapy

## 2019-08-23 ENCOUNTER — Other Ambulatory Visit: Payer: Self-pay

## 2019-08-23 DIAGNOSIS — R600 Localized edema: Secondary | ICD-10-CM | POA: Diagnosis not present

## 2019-08-23 DIAGNOSIS — R262 Difficulty in walking, not elsewhere classified: Secondary | ICD-10-CM

## 2019-08-23 DIAGNOSIS — M25662 Stiffness of left knee, not elsewhere classified: Secondary | ICD-10-CM

## 2019-08-23 DIAGNOSIS — M25562 Pain in left knee: Secondary | ICD-10-CM

## 2019-08-23 NOTE — Therapy (Signed)
[place], [place], [address] Phone: [phone]   Fax:  [phone]  Physical Therapy Treatment  Patient Details  Name: Claudia Lara MRN: [id number] Date of Birth: [birth year] Referring Provider (PT): Alusio   Encounter Date: 08/23/2019  PT End of Session - 08/23/19 0920    Visit Number  6    Date for PT Re-Evaluation  10/09/19    Authorization Type  MCR and KX at visit 10    PT Start Time  0842    PT Stop Time  0925    PT Time Calculation (min)  43 min    Activity Tolerance  Patient tolerated treatment well    Behavior During Therapy  [facility] for tasks assessed/performed       Past Medical History:  Diagnosis Date  . ANEMIA 08/22/2009  . Aortic stenosis   . AR (aortic regurgitation) 01/2019   Mild to Moderate, Noted on ECHO  . CAROTID ARTERY STENOSIS 01/16/2010  . Complication of anesthesia    very hard to wake up from surgery  . DYSPNEA ON EXERTION 11/16/2007  . Gallstones   . GEN OSTEOARTHROSIS INVOLVING MULTIPLE SITES 11/11/2008  . GERD (gastroesophageal reflux disease)   . Grade I diastolic dysfunction 43/1540   Noted on ECHO  . Heart murmur   . HEMORRHOIDS, INTERNAL    surgery was in the 90's (per patient)  . History of blood transfusion    after hip and knee replacements  . NECK PAIN, ACUTE 12/28/2009  . PERIPHERAL EDEMA 10/06/2007  . PONV (postoperative nausea and vomiting)   . Pulmonary nodule 06/18/2019   noted on CT Chest   . PVD 10/06/2007  . Unspecified essential hypertension 10/19/2007    Past Surgical History:  Procedure Laterality Date  . ABDOMINAL HYSTERECTOMY    . BIOPSY  08/06/2019   Procedure: BIOPSY;  Surgeon: Gatha Mayer, MD;  Location: [facility];  Service: Gastroenterology;;  . [name]     bilat  . CARPAL TUNNEL RELEASE    . CHOLECYSTECTOMY  01/29/2016   at [facility]  . CHOLECYSTECTOMY    . ENDOSCOPIC RETROGRADE  CHOLANGIOPANCREATOGRAPHY (ERCP) WITH PROPOFOL N/A 12/01/2015   Procedure: ENDOSCOPIC RETROGRADE CHOLANGIOPANCREATOGRAPHY (ERCP) WITH PROPOFOL;  Surgeon: [name], MD;  [facility];  Service: Endoscopy;  Laterality: N/A;  . ENDOSCOPIC RETROGRADE CHOLANGIOPANCREATOGRAPHY (ERCP) WITH PROPOFOL N/A 02/01/2016   Procedure: ENDOSCOPIC RETROGRADE CHOLANGIOPANCREATOGRAPHY (ERCP) WITH PROPOFOL;  Surgeon: [name], MD;  [facility];  Service: Endoscopy;  Laterality: N/A;  . ENDOSCOPIC RETROGRADE CHOLANGIOPANCREATOGRAPHY (ERCP) WITH PROPOFOL N/A 04/07/2018   Procedure: ENDOSCOPIC RETROGRADE CHOLANGIOPANCREATOGRAPHY (ERCP) WITH PROPOFOL;  Surgeon: [name], MD;  [facility];  Service: Endoscopy;  Laterality: N/A;  . ESOPHAGOGASTRODUODENOSCOPY (EGD) WITH PROPOFOL N/A 08/06/2019   Procedure: ESOPHAGOGASTRODUODENOSCOPY (EGD) WITH PROPOFOL;  Surgeon: Gatha Mayer, MD;  [facility];  Service: Gastroenterology;  Laterality: N/A;  . HAMMER TOE SURGERY    . HEMORRHOID SURGERY    . Hyperplastic colon polyps, removed  2007   By Dr. Penelope Coop  . JOINT REPLACEMENT  2011   right knee  . OOPHORECTOMY    . REMOVAL OF STONES  04/07/2018   Procedure: REMOVAL OF STONES;  Surgeon: [name], MD;  [facility];  Service: Endoscopy;;  . ROTATOR CUFF REPAIR  2004   left (Dr. Durward Fortes)  . SPHINCTEROTOMY  04/07/2018   Procedure: SPHINCTEROTOMY;  Surgeon: [name],  MD;  [facility];  Service: Endoscopy;;  . TOTAL HIP ARTHROPLASTY Right 11/26/2016   Procedure: RIGHT TOTAL HIP ARTHROPLASTY;  Surgeon: Garald Balding, MD;  Location: [facility];  Service: Orthopedics;  Laterality: Right;  . TOTAL KNEE ARTHROPLASTY Right   . TOTAL KNEE ARTHROPLASTY Left 07/26/2019   Procedure: TOTAL KNEE ARTHROPLASTY;  Surgeon: Gaynelle Arabian, MD;  [facility];  Service: Orthopedics;  Laterality: Left;  16mn    There were no vitals filed for this  visit.  Subjective Assessment - 08/23/19 0844    Subjective  I am stiff in the mornings, need to do something about this swelling.    Currently in Pain?  No/denies    Pain Location  Knee    Pain Orientation  Left    Pain Descriptors / Indicators  Sore    Aggravating Factors   worse in the morning                       [facility] Adult PT Treatment/Exercise - 08/23/19 0001      Ambulation/Gait   Gait Comments  gait with and without SPC, working on step length and bending knee and less limp      Knee/Hip Exercises: Aerobic   Recumbent Bike  bike 5 min full rev seat # 5    Nustep  L 4 6 min      Knee/Hip Exercises: Machines for Strengthening   Cybex Knee Extension  5# 2x10    Cybex Knee Flexion  25# 2x10    Cybex Leg Press  20# 2 sets 10      Knee/Hip Exercises: Standing   Heel Raises  Both;15 reps    Terminal Knee Extension  Left;15 reps;Theraband;2 sets      Manual Therapy   Manual Therapy  Passive ROM;Soft tissue mobilization    Soft tissue mobilization  scar mobilization    Passive ROM  PROM of the left knee flexion and extnension               PT Short Term Goals - 08/18/19 1524      PT SHORT TERM GOAL #1   Title  independent with initial HEP    Status  Achieved        PT Long Term Goals - 08/23/19 01610     PT LONG TERM GOAL #1   Title  walk 200 feet with SPC or no device    Status  On-going      PT LONG TERM GOAL #2   Title  go up her steps step over step    Status  On-going      PT LONG TERM GOAL #3   Title  increase AROM of the left knee to 5-115 degrees flexion    Status  Partially Met            Plan - 08/23/19 0921    Clinical Impression Statement  Patient's biggest issue is swelling, she reports that she will be seeing a vein specialist tomorrow.   She is doing better with her gait the stiffness seems to be worse today but she reports always "worse int he morning"    PT Next Visit Plan  work on ROM and function and  safety    Consulted and Agree with Plan of Care  Patient       Patient will benefit from skilled therapeutic intervention in order to improve the following deficits and impairments:  Abnormal gait,  Pain, Decreased scar mobility, Decreased mobility, Cardiopulmonary status limiting activity, Decreased activity tolerance, Decreased endurance, Decreased range of motion, Decreased strength, Increased edema, Difficulty walking, Decreased balance  Visit Diagnosis: Stiffness of left knee, not elsewhere classified  Acute pain of left knee  Difficulty in walking, not elsewhere classified  Localized edema     Problem List Patient Active Problem List   Diagnosis Date Noted  . Lower leg DVT (deep venous thromboembolism), acute, left ([facility]) 08/18/2019  . [place] ulcer, acute   . Gastric polyp   . Acute deep vein thrombosis (DVT) of calf muscle vein of left lower extremity   . Hypotension due to hypovolemia 08/04/2019  . Deficiency anemia 08/04/2019  . OA (osteoarthritis) of knee 07/26/2019  . Lung nodule 06/24/2019  . Bilateral leg edema 06/08/2019  . Zinc deficiency 10/03/2017  . Spondylosis, lumbar, with myelopathy 04/09/2017  . Hyperglycemia 03/05/2016  . Melena 08/03/2015  . GERD (gastroesophageal reflux disease) 08/03/2015  . Hemolytic anemia ([place]) 07/12/2015  . Chronic venous insufficiency 08/10/2014  . Aortic stenosis, mild 06/29/2014  . Left ventricular diastolic dysfunction, NYHA class 1 06/29/2014  . Senile osteopenia 04/21/2014  . Hyperlipidemia with target LDL less than 130 04/21/2014  . Kidney disease, chronic, stage III (GFR 30-59 ml/min) ([place]) 04/21/2014  . Routine health maintenance 07/16/2012  . Carotid artery stenosis without cerebral infarction 01/16/2010  . Essential hypertension 10/19/2007    Sumner Boast., PT 08/23/2019, 9:23 AM  [place] [address] [place], [place], [address] Phone:  [phone]   Fax:  [phone]  Name: Claudia Lara MRN: [id number] Date of Birth: 03/22/1932

## 2019-08-24 ENCOUNTER — Ambulatory Visit (HOSPITAL_COMMUNITY)
Admission: RE | Admit: 2019-08-24 | Discharge: 2019-08-24 | Disposition: A | Discharge status: Home / Self Care | Payer: Medicare Other | Source: Ambulatory Visit | Attending: Family | Admitting: Family

## 2019-08-24 DIAGNOSIS — I824Z2 Acute embolism and thrombosis of unspecified deep veins of left distal lower extremity: Secondary | ICD-10-CM | POA: Insufficient documentation

## 2019-08-24 NOTE — Progress Notes (Signed)
LLE Venous Duplex performed, preliminary results given to [facility]. Patient instructed to return home and await Dr. Ronnald Ramp' call. Preliminary report in Epic.

## 2019-08-25 ENCOUNTER — Ambulatory Visit: Payer: Medicare Other | Admitting: Physical Therapy

## 2019-08-25 ENCOUNTER — Encounter: Payer: Self-pay | Admitting: Physical Therapy

## 2019-08-25 ENCOUNTER — Other Ambulatory Visit: Payer: Self-pay

## 2019-08-25 DIAGNOSIS — M25562 Pain in left knee: Secondary | ICD-10-CM | POA: Diagnosis not present

## 2019-08-25 DIAGNOSIS — M25662 Stiffness of left knee, not elsewhere classified: Secondary | ICD-10-CM | POA: Diagnosis not present

## 2019-08-25 DIAGNOSIS — R262 Difficulty in walking, not elsewhere classified: Secondary | ICD-10-CM

## 2019-08-25 DIAGNOSIS — R600 Localized edema: Secondary | ICD-10-CM

## 2019-08-25 NOTE — Therapy (Signed)
[place], [place], [address] Phone: [phone]   Fax:  [phone]  Physical Therapy Treatment  Patient Details  Name: Claudia Lara MRN: [id number] Date of Birth: 1932-08-29 Referring Provider (PT): Alusio   Encounter Date: 08/25/2019  PT End of Session - 08/25/19 1010    Visit Number  7    Date for PT Re-Evaluation  10/09/19    PT Start Time  0930    PT Stop Time  1012    PT Time Calculation (min)  42 min    Activity Tolerance  Patient tolerated treatment well    Behavior During Therapy  [facility] for tasks assessed/performed       Past Medical History:  Diagnosis Date  . ANEMIA 08/22/2009  . Aortic stenosis   . AR (aortic regurgitation) 01/2019   Mild to Moderate, Noted on ECHO  . CAROTID ARTERY STENOSIS 01/16/2010  . Complication of anesthesia    very hard to wake up from surgery  . DYSPNEA ON EXERTION 11/16/2007  . Gallstones   . GEN OSTEOARTHROSIS INVOLVING MULTIPLE SITES 11/11/2008  . GERD (gastroesophageal reflux disease)   . Grade I diastolic dysfunction 10/7516   Noted on ECHO  . Heart murmur   . HEMORRHOIDS, INTERNAL    surgery was in the 90's (per patient)  . History of blood transfusion    after hip and knee replacements  . NECK PAIN, ACUTE 12/28/2009  . PERIPHERAL EDEMA 10/06/2007  . PONV (postoperative nausea and vomiting)   . Pulmonary nodule 06/18/2019   noted on CT Chest   . PVD 10/06/2007  . Unspecified essential hypertension 10/19/2007    Past Surgical History:  Procedure Laterality Date  . ABDOMINAL HYSTERECTOMY    . BIOPSY  08/06/2019   Procedure: BIOPSY;  Surgeon: Gatha Mayer, MD;  Location: [facility];  Service: Gastroenterology;;  . [name]     bilat  . CARPAL TUNNEL RELEASE    . CHOLECYSTECTOMY  01/29/2016   at [facility]  . CHOLECYSTECTOMY    . ENDOSCOPIC RETROGRADE CHOLANGIOPANCREATOGRAPHY (ERCP) WITH PROPOFOL N/A 12/01/2015    Procedure: ENDOSCOPIC RETROGRADE CHOLANGIOPANCREATOGRAPHY (ERCP) WITH PROPOFOL;  Surgeon: [name], MD;  [facility];  Service: Endoscopy;  Laterality: N/A;  . ENDOSCOPIC RETROGRADE CHOLANGIOPANCREATOGRAPHY (ERCP) WITH PROPOFOL N/A 02/01/2016   Procedure: ENDOSCOPIC RETROGRADE CHOLANGIOPANCREATOGRAPHY (ERCP) WITH PROPOFOL;  Surgeon: [name], MD;  [facility];  Service: Endoscopy;  Laterality: N/A;  . ENDOSCOPIC RETROGRADE CHOLANGIOPANCREATOGRAPHY (ERCP) WITH PROPOFOL N/A 04/07/2018   Procedure: ENDOSCOPIC RETROGRADE CHOLANGIOPANCREATOGRAPHY (ERCP) WITH PROPOFOL;  Surgeon: [name], MD;  [facility];  Service: Endoscopy;  Laterality: N/A;  . ESOPHAGOGASTRODUODENOSCOPY (EGD) WITH PROPOFOL N/A 08/06/2019   Procedure: ESOPHAGOGASTRODUODENOSCOPY (EGD) WITH PROPOFOL;  Surgeon: Gatha Mayer, MD;  [facility];  Service: Gastroenterology;  Laterality: N/A;  . HAMMER TOE SURGERY    . HEMORRHOID SURGERY    . Hyperplastic colon polyps, removed  2007   By Dr. Penelope Coop  . JOINT REPLACEMENT  2011   right knee  . OOPHORECTOMY    . REMOVAL OF STONES  04/07/2018   Procedure: REMOVAL OF STONES;  Surgeon: [name], MD;  [facility];  Service: Endoscopy;;  . ROTATOR CUFF REPAIR  2004   left (Dr. Durward Fortes)  . SPHINCTEROTOMY  04/07/2018   Procedure: SPHINCTEROTOMY;  Surgeon: [name], MD;  Location: [facility];  Service: Endoscopy;;  . TOTAL HIP  ARTHROPLASTY Right 11/26/2016   Procedure: RIGHT TOTAL HIP ARTHROPLASTY;  Surgeon: Garald Balding, MD;  Location: [facility] [place];  Service: Orthopedics;  Laterality: Right;  . TOTAL KNEE ARTHROPLASTY Right   . TOTAL KNEE ARTHROPLASTY Left 07/26/2019   Procedure: TOTAL KNEE ARTHROPLASTY;  Surgeon: Gaynelle Arabian, MD;  [facility];  Service: Orthopedics;  Laterality: Left;  63mn    There were no vitals filed for this visit.  Subjective Assessment - 08/25/19 0933    Subjective  The  doppler study was the same as in the hospitla a small clot in "an insignifcant vein"    Currently in Pain?  Yes    Pain Score  2     Pain Location  Knee    Pain Orientation  Left    Pain Descriptors / Indicators  Tightness;Sore    Aggravating Factors   bending         [facility] PT Assessment - 08/25/19 0001      AROM   Left Knee Extension  15    Left Knee Flexion  105      PROM   Left Knee Extension  9    Left Knee Flexion  110                   [facility] Adult PT Treatment/Exercise - 08/25/19 0001      Ambulation/Gait   Gait Comments  gait without device 110' x 2 first few steps are antalgic and difficult with walking      Knee/Hip Exercises: Aerobic   Recumbent Bike  bike 5 min full rev seat # 5    Nustep  L 4 6 min      Knee/Hip Exercises: Machines for Strengthening   Cybex Knee Extension  5# 2x10    Cybex Knee Flexion  25# 2x10    Cybex Leg Press  20# 2 sets 10      Knee/Hip Exercises: Standing   Heel Raises  Both;15 reps    Terminal Knee Extension  Left;15 reps;Theraband;2 sets    Terminal Knee Extension Limitations  ball behind knee      Manual Therapy   Manual Therapy  Passive ROM;Soft tissue mobilization    Soft tissue mobilization  scar mobilization    Passive ROM  PROM of the left knee flexion and extnension               PT Short Term Goals - 08/25/19 1011      PT SHORT TERM GOAL #1   Title  independent with initial HEP        PT Long Term Goals - 08/25/19 1011      PT LONG TERM GOAL #1   Title  walk 200 feet with SPC or no device    Status  Partially Met      PT LONG TERM GOAL #2   Title  go up her steps step over step    Status  On-going      PT LONG TERM GOAL #3   Title  increase AROM of the left knee to 5-115 degrees flexion    Status  Partially Met      PT LONG TERM GOAL #4   Title  decrease pain 50%    Status  Partially Met            Plan - 08/25/19 1010    Clinical Impression Statement  Patient is  improving with her gait and her ROm, she is still very  limited in extension due to swelling.  She is tight and her first few steps are very antalgic and then she smooths out    PT Next Visit Plan  write MD note next visit    Consulted and Agree with Plan of Care  Patient       Patient will benefit from skilled therapeutic intervention in order to improve the following deficits and impairments:  Abnormal gait, Pain, Decreased scar mobility, Decreased mobility, Cardiopulmonary status limiting activity, Decreased activity tolerance, Decreased endurance, Decreased range of motion, Decreased strength, Increased edema, Difficulty walking, Decreased balance  Visit Diagnosis: Stiffness of left knee, not elsewhere classified  Acute pain of left knee  Difficulty in walking, not elsewhere classified  Localized edema     Problem List Patient Active Problem List   Diagnosis Date Noted  . Lower leg DVT (deep venous thromboembolism), acute, left ([place]) 08/18/2019  . [place] ulcer, acute   . Gastric polyp   . Acute deep vein thrombosis (DVT) of calf muscle vein of left lower extremity   . Hypotension due to hypovolemia 08/04/2019  . Deficiency anemia 08/04/2019  . OA (osteoarthritis) of knee 07/26/2019  . Lung nodule 06/24/2019  . Bilateral leg edema 06/08/2019  . Zinc deficiency 10/03/2017  . Spondylosis, lumbar, with myelopathy 04/09/2017  . Hyperglycemia 03/05/2016  . Melena 08/03/2015  . GERD (gastroesophageal reflux disease) 08/03/2015  . Hemolytic anemia ([place]) 07/12/2015  . Chronic venous insufficiency 08/10/2014  . Aortic stenosis, mild 06/29/2014  . Left ventricular diastolic dysfunction, NYHA class 1 06/29/2014  . Senile osteopenia 04/21/2014  . Hyperlipidemia with target LDL less than 130 04/21/2014  . Kidney disease, chronic, stage III (GFR 30-59 ml/min) ([place]) 04/21/2014  . Routine health maintenance 07/16/2012  . Carotid artery stenosis without cerebral infarction 01/16/2010   . Essential hypertension 10/19/2007    Sumner Boast., PT 08/25/2019, 10:12 AM  [place] [address] [place], [place], [address] Phone: [phone]   Fax:  [phone]  Name: Claudia Lara MRN: [id number] Date of Birth: 03-04-1932

## 2019-08-30 ENCOUNTER — Ambulatory Visit: Payer: Medicare Other | Admitting: Physical Therapy

## 2019-08-30 ENCOUNTER — Inpatient Hospital Stay (HOSPITAL_COMMUNITY)
Admission: EM | Admit: 2019-08-30 | Discharge: 2019-09-03 | DRG: 871 | Disposition: A | Discharge status: Home Health | Payer: Medicare Other | Attending: Student | Admitting: Student

## 2019-08-30 ENCOUNTER — Encounter (HOSPITAL_COMMUNITY): Payer: Self-pay | Admitting: Emergency Medicine

## 2019-08-30 ENCOUNTER — Other Ambulatory Visit: Payer: Self-pay

## 2019-08-30 ENCOUNTER — Emergency Department (HOSPITAL_COMMUNITY): Payer: Medicare Other

## 2019-08-30 DIAGNOSIS — Z66 Do not resuscitate: Secondary | ICD-10-CM | POA: Diagnosis present

## 2019-08-30 DIAGNOSIS — N183 Chronic kidney disease, stage 3 unspecified: Secondary | ICD-10-CM | POA: Diagnosis present

## 2019-08-30 DIAGNOSIS — Z96653 Presence of artificial knee joint, bilateral: Secondary | ICD-10-CM | POA: Diagnosis present

## 2019-08-30 DIAGNOSIS — I352 Nonrheumatic aortic (valve) stenosis with insufficiency: Secondary | ICD-10-CM | POA: Diagnosis present

## 2019-08-30 DIAGNOSIS — N179 Acute kidney failure, unspecified: Secondary | ICD-10-CM | POA: Diagnosis not present

## 2019-08-30 DIAGNOSIS — E785 Hyperlipidemia, unspecified: Secondary | ICD-10-CM | POA: Diagnosis present

## 2019-08-30 DIAGNOSIS — Z79891 Long term (current) use of opiate analgesic: Secondary | ICD-10-CM

## 2019-08-30 DIAGNOSIS — I129 Hypertensive chronic kidney disease with stage 1 through stage 4 chronic kidney disease, or unspecified chronic kidney disease: Secondary | ICD-10-CM | POA: Diagnosis present

## 2019-08-30 DIAGNOSIS — K219 Gastro-esophageal reflux disease without esophagitis: Secondary | ICD-10-CM | POA: Diagnosis not present

## 2019-08-30 DIAGNOSIS — Z806 Family history of leukemia: Secondary | ICD-10-CM | POA: Diagnosis not present

## 2019-08-30 DIAGNOSIS — R17 Unspecified jaundice: Secondary | ICD-10-CM | POA: Diagnosis not present

## 2019-08-30 DIAGNOSIS — Z20828 Contact with and (suspected) exposure to other viral communicable diseases: Secondary | ICD-10-CM | POA: Diagnosis present

## 2019-08-30 DIAGNOSIS — Z7982 Long term (current) use of aspirin: Secondary | ICD-10-CM

## 2019-08-30 DIAGNOSIS — J90 Pleural effusion, not elsewhere classified: Secondary | ICD-10-CM | POA: Diagnosis not present

## 2019-08-30 DIAGNOSIS — Z882 Allergy status to sulfonamides status: Secondary | ICD-10-CM

## 2019-08-30 DIAGNOSIS — R0682 Tachypnea, not elsewhere classified: Secondary | ICD-10-CM

## 2019-08-30 DIAGNOSIS — Z9049 Acquired absence of other specified parts of digestive tract: Secondary | ICD-10-CM | POA: Diagnosis not present

## 2019-08-30 DIAGNOSIS — A419 Sepsis, unspecified organism: Secondary | ICD-10-CM | POA: Diagnosis present

## 2019-08-30 DIAGNOSIS — Z96641 Presence of right artificial hip joint: Secondary | ICD-10-CM | POA: Diagnosis not present

## 2019-08-30 DIAGNOSIS — Z79899 Other long term (current) drug therapy: Secondary | ICD-10-CM

## 2019-08-30 DIAGNOSIS — Z801 Family history of malignant neoplasm of trachea, bronchus and lung: Secondary | ICD-10-CM

## 2019-08-30 DIAGNOSIS — Z90710 Acquired absence of both cervix and uterus: Secondary | ICD-10-CM

## 2019-08-30 DIAGNOSIS — D649 Anemia, unspecified: Secondary | ICD-10-CM

## 2019-08-30 DIAGNOSIS — R600 Localized edema: Secondary | ICD-10-CM | POA: Diagnosis present

## 2019-08-30 DIAGNOSIS — R509 Fever, unspecified: Secondary | ICD-10-CM | POA: Diagnosis not present

## 2019-08-30 DIAGNOSIS — Z8711 Personal history of peptic ulcer disease: Secondary | ICD-10-CM

## 2019-08-30 DIAGNOSIS — R000 Tachycardia, unspecified: Secondary | ICD-10-CM | POA: Diagnosis not present

## 2019-08-30 DIAGNOSIS — D696 Thrombocytopenia, unspecified: Secondary | ICD-10-CM | POA: Diagnosis present

## 2019-08-30 DIAGNOSIS — R6520 Severe sepsis without septic shock: Secondary | ICD-10-CM | POA: Diagnosis not present

## 2019-08-30 DIAGNOSIS — Z86010 Personal history of colonic polyps: Secondary | ICD-10-CM

## 2019-08-30 DIAGNOSIS — N390 Urinary tract infection, site not specified: Secondary | ICD-10-CM | POA: Diagnosis present

## 2019-08-30 DIAGNOSIS — D509 Iron deficiency anemia, unspecified: Secondary | ICD-10-CM | POA: Diagnosis not present

## 2019-08-30 DIAGNOSIS — I6529 Occlusion and stenosis of unspecified carotid artery: Secondary | ICD-10-CM | POA: Diagnosis present

## 2019-08-30 DIAGNOSIS — I739 Peripheral vascular disease, unspecified: Secondary | ICD-10-CM | POA: Diagnosis not present

## 2019-08-30 DIAGNOSIS — G9341 Metabolic encephalopathy: Secondary | ICD-10-CM

## 2019-08-30 DIAGNOSIS — Z8041 Family history of malignant neoplasm of ovary: Secondary | ICD-10-CM

## 2019-08-30 DIAGNOSIS — Z86718 Personal history of other venous thrombosis and embolism: Secondary | ICD-10-CM

## 2019-08-30 DIAGNOSIS — N3000 Acute cystitis without hematuria: Secondary | ICD-10-CM | POA: Diagnosis not present

## 2019-08-30 DIAGNOSIS — R0902 Hypoxemia: Secondary | ICD-10-CM

## 2019-08-30 LAB — URINALYSIS, ROUTINE W REFLEX MICROSCOPIC
Bilirubin Urine: NEGATIVE
Glucose, UA: NEGATIVE mg/dL
Ketones, ur: NEGATIVE mg/dL
Nitrite: NEGATIVE
Protein, ur: 100 mg/dL — AB
Specific Gravity, Urine: 1.014 (ref 1.005–1.030)
pH: 6 (ref 5.0–8.0)

## 2019-08-30 LAB — COMPREHENSIVE METABOLIC PANEL
ALT: 16 U/L (ref 0–44)
AST: 22 U/L (ref 15–41)
Albumin: 4 g/dL (ref 3.5–5.0)
Alkaline Phosphatase: 62 U/L (ref 38–126)
Anion gap: 9 (ref 5–15)
BUN: 42 mg/dL — ABNORMAL HIGH (ref 8–23)
CO2: 21 mmol/L — ABNORMAL LOW (ref 22–32)
Calcium: 8.4 mg/dL — ABNORMAL LOW (ref 8.9–10.3)
Chloride: 106 mmol/L (ref 98–111)
Creatinine, Ser: 1.62 mg/dL — ABNORMAL HIGH (ref 0.44–1.00)
GFR calc Af Amer: 33 mL/min — ABNORMAL LOW (ref >60)
GFR calc non Af Amer: 29 mL/min — ABNORMAL LOW (ref >60)
Glucose, Bld: 138 mg/dL — ABNORMAL HIGH (ref 70–99)
Potassium: 4.3 mmol/L (ref 3.5–5.1)
Sodium: 136 mmol/L (ref 135–145)
Total Bilirubin: 2.9 mg/dL — ABNORMAL HIGH (ref 0.3–1.2)
Total Protein: 6.6 g/dL (ref 6.5–8.1)

## 2019-08-30 LAB — CBC WITH DIFFERENTIAL/PLATELET
Abs Immature Granulocytes: 0.03 10*3/uL (ref 0.00–0.07)
Basophils Absolute: 0 10*3/uL (ref 0.0–0.1)
Basophils Relative: 0 %
Eosinophils Absolute: 0 10*3/uL (ref 0.0–0.5)
Eosinophils Relative: 0 %
HCT: 21.5 % — ABNORMAL LOW (ref 36.0–46.0)
Hemoglobin: 6.9 g/dL — CL (ref 12.0–15.0)
Immature Granulocytes: 1 %
Lymphocytes Relative: 10 %
Lymphs Abs: 0.6 10*3/uL — ABNORMAL LOW (ref 0.7–4.0)
MCH: 30.3 pg (ref 26.0–34.0)
MCHC: 32.1 g/dL (ref 30.0–36.0)
MCV: 94.3 fL (ref 80.0–100.0)
Monocytes Absolute: 0.7 10*3/uL (ref 0.1–1.0)
Monocytes Relative: 12 %
Neutro Abs: 4.9 10*3/uL (ref 1.7–7.7)
Neutrophils Relative %: 77 %
Platelets: 174 10*3/uL (ref 150–400)
RBC: 2.28 MIL/uL — ABNORMAL LOW (ref 3.87–5.11)
RDW: 18.6 % — ABNORMAL HIGH (ref 11.5–15.5)
WBC: 6.3 10*3/uL (ref 4.0–10.5)
nRBC: 0 % (ref 0.0–0.2)

## 2019-08-30 LAB — PREPARE RBC (CROSSMATCH)

## 2019-08-30 LAB — RETICULOCYTES
Immature Retic Fract: 15.2 % (ref 2.3–15.9)
RBC.: 2.31 MIL/uL — ABNORMAL LOW (ref 3.87–5.11)
Retic Count, Absolute: 78.8 10*3/uL (ref 19.0–186.0)
Retic Ct Pct: 3.4 % — ABNORMAL HIGH (ref 0.4–3.1)

## 2019-08-30 LAB — LACTIC ACID, PLASMA: Lactic Acid, Venous: 1.2 mmol/L (ref 0.5–1.9)

## 2019-08-30 LAB — OCCULT BLOOD, POC DEVICE: Fecal Occult Bld: NEGATIVE

## 2019-08-30 LAB — SARS CORONAVIRUS 2 BY RT PCR (HOSPITAL ORDER, PERFORMED IN ~~LOC~~ HOSPITAL LAB): SARS Coronavirus 2: NEGATIVE

## 2019-08-30 MED ORDER — ADULT MULTIVITAMIN W/MINERALS CH
1.0000 | ORAL_TABLET | Freq: Every day | ORAL | Status: DC
  Administered 2019-08-31 – 2019-09-03 (×4): 1 via ORAL
  Filled 2019-08-30 (×4): qty 1

## 2019-08-30 MED ORDER — ZINC SULFATE 220 (50 ZN) MG PO CAPS
220.0000 mg | ORAL_CAPSULE | Freq: Every day | ORAL | Status: DC
  Administered 2019-08-31 – 2019-09-03 (×4): 220 mg via ORAL
  Filled 2019-08-30 (×4): qty 1

## 2019-08-30 MED ORDER — SODIUM CHLORIDE 0.9 % IV SOLN
1.0000 g | INTRAVENOUS | Status: DC
  Filled 2019-08-30: qty 10

## 2019-08-30 MED ORDER — SODIUM CHLORIDE 0.9 % IV SOLN: 10.0000 mL/h | Freq: Once | INTRAVENOUS | Status: DC

## 2019-08-30 MED ORDER — ACETAMINOPHEN 325 MG PO TABS
650.0000 mg | ORAL_TABLET | Freq: Four times a day (QID) | ORAL | Status: DC | PRN
  Administered 2019-08-31 – 2019-09-01 (×3): 650 mg via ORAL
  Filled 2019-08-30 (×4): qty 2

## 2019-08-30 MED ORDER — SODIUM CHLORIDE 0.9 % IV SOLN
1.0000 g | Freq: Once | INTRAVENOUS | Status: AC
  Administered 2019-08-30: 23:00:00 1 g via INTRAVENOUS
  Filled 2019-08-30: qty 10

## 2019-08-30 MED ORDER — VITAMIN D3 25 MCG (1000 UNIT) PO TABS
1000.0000 [IU] | ORAL_TABLET | Freq: Every day | ORAL | Status: DC
  Administered 2019-08-31 – 2019-09-03 (×4): 1000 [IU] via ORAL
  Filled 2019-08-30 (×4): qty 1

## 2019-08-30 MED ORDER — CARVEDILOL 12.5 MG PO TABS
12.5000 mg | ORAL_TABLET | Freq: Two times a day (BID) | ORAL | Status: DC
  Filled 2019-08-30: qty 1

## 2019-08-30 MED ORDER — SODIUM CHLORIDE 0.9% IV SOLUTION
Freq: Once | INTRAVENOUS | Status: AC
  Administered 2019-08-31: 07:00:00 via INTRAVENOUS

## 2019-08-30 MED ORDER — ACETAMINOPHEN 650 MG RE SUPP
650.0000 mg | Freq: Four times a day (QID) | RECTAL | Status: DC | PRN
  Administered 2019-08-31: 650 mg via RECTAL
  Filled 2019-08-30: qty 1

## 2019-08-30 MED ORDER — PANTOPRAZOLE SODIUM 40 MG PO TBEC
40.0000 mg | DELAYED_RELEASE_TABLET | Freq: Every day | ORAL | Status: DC
  Administered 2019-08-31 – 2019-09-03 (×4): 40 mg via ORAL
  Filled 2019-08-30 (×4): qty 1

## 2019-08-30 MED ORDER — VITAMIN C 500 MG PO TABS
500.0000 mg | ORAL_TABLET | Freq: Every day | ORAL | Status: DC
  Administered 2019-08-31 – 2019-09-03 (×4): 500 mg via ORAL
  Filled 2019-08-30 (×4): qty 1

## 2019-08-30 MED ORDER — SODIUM CHLORIDE 0.9 % IV BOLUS (SEPSIS)
500.0000 mL | Freq: Once | INTRAVENOUS | Status: AC
  Administered 2019-08-30: 500 mL via INTRAVENOUS

## 2019-08-30 MED ORDER — FERROUS SULFATE 325 (65 FE) MG PO TABS
325.0000 mg | ORAL_TABLET | Freq: Two times a day (BID) | ORAL | Status: DC
  Administered 2019-08-31 – 2019-09-03 (×7): 325 mg via ORAL
  Filled 2019-08-30 (×8): qty 1

## 2019-08-30 MED ORDER — SODIUM CHLORIDE 0.9 % IV SOLN
1000.0000 mL | INTRAVENOUS | Status: DC
  Administered 2019-08-30: 1000 mL via INTRAVENOUS

## 2019-08-30 MED ORDER — GABAPENTIN 100 MG PO CAPS
100.0000 mg | ORAL_CAPSULE | Freq: Three times a day (TID) | ORAL | Status: DC
  Administered 2019-08-31 – 2019-09-03 (×11): 100 mg via ORAL
  Filled 2019-08-30 (×11): qty 1

## 2019-08-30 MED ORDER — SODIUM CHLORIDE 0.9 % IV SOLN
INTRAVENOUS | Status: DC
  Administered 2019-08-31: 05:00:00 via INTRAVENOUS

## 2019-08-30 NOTE — ED Triage Notes (Signed)
Pt reports that her daughter thinks her blood might be low again since she thinks she has been incoherent since yesterday. Pt reports today her daughter said she tried calling her many times before patient answered. Asked patient if she heard the phone ringing, pt replied "well, I answered it when I did hear it ringing". Denies urinary problems or vomiting. Reports taking iron so does have dark stools. Pt knows what month, states year 2000, but knows Trump is president. Thinks today is Tuesday instead of Monday.

## 2019-08-30 NOTE — H&P (Signed)
History and Physical    Claudia Lara [id number] DOB: 10-16-1932 DOA: 08/30/2019  PCP: Janith Lima, MD Patient coming from: Home  Chief Complaint: Confusion  HPI: Claudia Lara is a 85 y.o. female with medical history significant of anemia, carotid artery stenosis, GERD, hemorrhoids, hypertension, PVD presenting to the hospital for evaluation of confusion.  Patient was initially brought to the ED by her daughter because the daughter felt that the patient was confused since yesterday.  Patient's daughter attempted to call the patient at home and she would not answer the phone.  Patient is AAO x4.  Patient states she was brought into the hospital by her daughter as her daughter thought she was incoherent and was anemic as she appeared pale.  Patient's only complaint is having urinary frequency which she states she had discussed with her physician previously.  Denies dysuria.  Denies fevers or chills.  Denies headaches, cough, shortness of breath, nausea, vomiting, abdominal pain, or diarrhea.  States her stool is chronically dark in color as she takes iron pills for anemia.  No other complaints.  ED Course: Did not appear confused upon evaluation done by ED provider but noted to be febrile with temperature 102.2 F.  No leukocytosis.  Hemoglobin 6.9, was 9.2 on 08/18/2019.  Lactic acid normal.  SARS-CoV-2 test pending.  Blood culture x2 pending.  BUN 42, creatinine 1.6.  Baseline creatinine 1.1-1.3.  T bili 2.9, previously elevated as well and no significant change compared to recent labs.  Remainder of LFTs normal.  UA with large amount of leukocytes, 21-50 RBCs, 21-50 WBCs, and many bacteria on microscopic examination.  FOBT negative.  Anemia panel pending. Chest x-ray showing mild blunting of the left costophrenic angle laterally, suspicious for small pleural effusion.  No other acute cardiopulmonary process. Patient received a 500 cc normal saline bolus and was started on normal saline at  125 cc/h.  Review of Systems:  All systems reviewed and apart from history of presenting illness, are negative.  Past Medical History:  Diagnosis Date  . ANEMIA 08/22/2009  . Aortic stenosis   . AR (aortic regurgitation) 01/2019   Mild to Moderate, Noted on ECHO  . CAROTID ARTERY STENOSIS 01/16/2010  . Complication of anesthesia    very hard to wake up from surgery  . DYSPNEA ON EXERTION 11/16/2007  . Gallstones   . GEN OSTEOARTHROSIS INVOLVING MULTIPLE SITES 11/11/2008  . GERD (gastroesophageal reflux disease)   . Grade I diastolic dysfunction [id number]   Noted on ECHO  . Heart murmur   . HEMORRHOIDS, INTERNAL    surgery was in the 90's (per patient)  . History of blood transfusion    after hip and knee replacements  . NECK PAIN, ACUTE 12/28/2009  . PERIPHERAL EDEMA 10/06/2007  . PONV (postoperative nausea and vomiting)   . Pulmonary nodule 06/18/2019   noted on CT Chest   . PVD 10/06/2007  . Unspecified essential hypertension 10/19/2007    Past Surgical History:  Procedure Laterality Date  . ABDOMINAL HYSTERECTOMY    . BIOPSY  08/06/2019   Procedure: BIOPSY;  Surgeon: Gatha Mayer, MD;  Location: [facility];  Service: Gastroenterology;;  . [name]     bilat  . CARPAL TUNNEL RELEASE    . CHOLECYSTECTOMY  01/29/2016   at [facility]  . CHOLECYSTECTOMY    . ENDOSCOPIC RETROGRADE CHOLANGIOPANCREATOGRAPHY (ERCP) WITH PROPOFOL N/A 12/01/2015   Procedure: ENDOSCOPIC RETROGRADE CHOLANGIOPANCREATOGRAPHY (ERCP) WITH PROPOFOL;  Surgeon: [name], MD;  Location: [facility];  Service: Endoscopy;  Laterality: N/A;  . ENDOSCOPIC RETROGRADE CHOLANGIOPANCREATOGRAPHY (ERCP) WITH PROPOFOL N/A 02/01/2016   Procedure: ENDOSCOPIC RETROGRADE CHOLANGIOPANCREATOGRAPHY (ERCP) WITH PROPOFOL;  Surgeon: [name], MD;  Location: [facility];  Service: Endoscopy;  Laterality: N/A;  . ENDOSCOPIC RETROGRADE CHOLANGIOPANCREATOGRAPHY (ERCP) WITH PROPOFOL N/A 04/07/2018    Procedure: ENDOSCOPIC RETROGRADE CHOLANGIOPANCREATOGRAPHY (ERCP) WITH PROPOFOL;  Surgeon: [name], MD;  Location: [facility];  Service: Endoscopy;  Laterality: N/A;  . ESOPHAGOGASTRODUODENOSCOPY (EGD) WITH PROPOFOL N/A 08/06/2019   Procedure: ESOPHAGOGASTRODUODENOSCOPY (EGD) WITH PROPOFOL;  Surgeon: Gatha Mayer, MD;  Location: [facility];  Service: Gastroenterology;  Laterality: N/A;  . HAMMER TOE SURGERY    . HEMORRHOID SURGERY    . Hyperplastic colon polyps, removed  2007   By Dr. Penelope Coop  . JOINT REPLACEMENT  2011   right knee  . OOPHORECTOMY    . REMOVAL OF STONES  04/07/2018   Procedure: REMOVAL OF STONES;  Surgeon: [name], MD;  Location: [facility];  Service: Endoscopy;;  . ROTATOR CUFF REPAIR  2004   left (Dr. Durward Fortes)  . SPHINCTEROTOMY  04/07/2018   Procedure: SPHINCTEROTOMY;  Surgeon: [name], MD;  Location: [facility];  Service: Endoscopy;;  . TOTAL HIP ARTHROPLASTY Right 11/26/2016   Procedure: RIGHT TOTAL HIP ARTHROPLASTY;  Surgeon: Garald Balding, MD;  Location: [facility];  Service: Orthopedics;  Laterality: Right;  . TOTAL KNEE ARTHROPLASTY Right   . TOTAL KNEE ARTHROPLASTY Left 07/26/2019   Procedure: TOTAL KNEE ARTHROPLASTY;  Surgeon: Gaynelle Arabian, MD;  Location: [facility];  Service: Orthopedics;  Laterality: Left;  14min     reports that she has never smoked. She has never used smokeless tobacco. She reports that she does not drink alcohol or use drugs.  Allergies  Allergen Reactions  . Sulfonamide Derivatives Rash    Childhood reaction    Family History  Problem Relation Age of Onset  . Ovarian cancer Mother   . Cancer Mother   . Leukemia Father   . Lung cancer Father   . Cancer Father   . Cancer Brother   . Diabetes Neg Hx   . Heart disease Neg Hx   . Hypertension Neg Hx     Prior to Admission medications   Medication Sig Start Date End Date Taking? Authorizing Provider  aspirin 325 MG EC tablet Take 1 tablet (325 mg  total) by mouth daily. 08/06/19  Yes Sheikh, Omair Latif, DO  Calcium Carb-Cholecalciferol (CALCIUM CARBONATE-VITAMIN D3 PO) Take 1 tablet by mouth daily.    Yes [provider]  carvedilol (COREG) 12.5 MG tablet TAKE 1 TABLET (12.5 MG TOTAL) BY MOUTH 2 (TWO) TIMES DAILY WITH A MEAL. 07/17/19  Yes Janith Lima, MD  cholecalciferol (VITAMIN D) 1000 units tablet Take 1,000 Units by mouth daily.   Yes [provider]  ferrous sulfate 325 (65 FE) MG tablet Take 1 tablet (325 mg total) by mouth 2 (two) times daily with a meal. 07/28/19  Yes Constable, Amber, PA-C  gabapentin (NEURONTIN) 100 MG capsule Take 1 capsule (100 mg total) by mouth 3 (three) times daily. 1 tab 3x/day for 2 weeks. 1 tab 2x/day for 2 weeks. 1 tab a day for 2 weeks 07/28/19  Yes Constable, [employer], PA-C  Multiple Vitamin (MULTIVITAMIN WITH MINERALS) TABS tablet Take 1 tablet by mouth daily.   Yes [provider]  pantoprazole (PROTONIX) 40 MG tablet Take 1 tablet (40 mg total) by mouth daily. 06/08/19  Yes Janith Lima, MD  torsemide (DEMADEX) 20 MG tablet Take 1 tablet (20 mg total) by mouth daily. 06/08/19  Yes Janith Lima, MD  traMADol (ULTRAM) 50 MG tablet Take 1-2 tablets (50-100 mg total) by mouth every 12 (twelve) hours as needed for moderate pain. 07/28/19  Yes Constable, [employer]gallery [employer], PA-C  vitamin C (ASCORBIC ACID) 500 MG tablet Take 500 mg by mouth daily.   Yes [provider]  zinc gluconate 50 MG tablet Take 1 tablet (50 mg total) daily by mouth. 10/03/17  Yes Janith Lima, MD  oxyCODONE (OXY IR/ROXICODONE) 5 MG immediate release tablet Take 1-2 tablets (5-10 mg total) by mouth every 4 (four) hours as needed for severe pain. Patient not taking: Reported on 08/30/2019 07/28/19   Ardeen Jourdain, PA-C    Physical Exam: Vitals:   08/31/19 0231 08/31/19 0300 08/31/19 0400 08/31/19 0519  BP: (!) 150/52 (!) 149/60    Pulse: (!) 119 (!) 117    Resp: 20 (!) 39    Temp: (!) 103.2 F (39.6 C)  (!)  100.6 F (38.1 C) 99.9 F (37.7 C)  TempSrc: Oral  Oral Oral  SpO2: 93% 92%      Physical Exam  Constitutional: She is oriented to person, place, and time. She appears well-developed and well-nourished. No distress.  HENT:  Head: Normocephalic.  Mouth/Throat: Oropharynx is clear and moist.  Eyes: Right eye exhibits no discharge. Left eye exhibits no discharge.  Neck: Neck supple.  Cardiovascular: Regular rhythm and intact distal pulses.  Mildly tachycardic  Pulmonary/Chest: Effort normal and breath sounds normal. No respiratory distress. She has no wheezes. She has no rales.  Abdominal: Soft. Bowel sounds are normal. She exhibits no distension. There is no abdominal tenderness. There is no guarding.  Musculoskeletal:        General: Edema present.     Comments: +2 pitting edema of left lower extremity (chronic per patient).  No tenderness.  Neurological: She is alert and oriented to person, place, and time.  Skin: Skin is warm and dry. She is not diaphoretic.     Labs on Admission: I have personally reviewed following labs and imaging studies  CBC: Recent Labs  Lab 08/30/19 1903  WBC 6.3  NEUTROABS 4.9  HGB 6.9*  HCT 21.5*  MCV 94.3  PLT [id number]   Basic Metabolic Panel: Recent Labs  Lab 08/30/19 1903  NA 136  K 4.3  CL 106  CO2 21*  GLUCOSE 138*  BUN 42*  CREATININE 1.62*  CALCIUM 8.4*   GFR: Estimated Creatinine Clearance: 25.1 mL/min (A) (by C-G formula based on SCr of 1.62 mg/dL (H)). Liver Function Tests: Recent Labs  Lab 08/30/19 1903  AST 22  ALT 16  ALKPHOS 62  BILITOT 2.9*  PROT 6.6  ALBUMIN 4.0   No results for input(s): LIPASE, AMYLASE in the last 168 hours. No results for input(s): AMMONIA in the last 168 hours. Coagulation Profile: No results for input(s): INR, PROTIME in the last 168 hours. Cardiac Enzymes: No results for input(s): CKTOTAL, CKMB, CKMBINDEX, TROPONINI in the last 168 hours. BNP (last 3 results) Recent Labs     08/04/19 1054  PROBNP 305.0*   HbA1C: No results for input(s): HGBA1C in the last 72 hours. CBG: No results for input(s): GLUCAP in the last 168 hours. Lipid Profile: No results for input(s): CHOL, HDL, LDLCALC, TRIG, CHOLHDL, LDLDIRECT in the last 72 hours. Thyroid Function Tests: No results for input(s): TSH, T4TOTAL, FREET4, T3FREE, THYROIDAB  in the last 72 hours. Anemia Panel: Recent Labs    08/30/19 2118  VITAMINB12 357  FOLATE >24.8  FERRITIN 534*  TIBC 179*  IRON 13*  RETICCTPCT 3.4*   Urine analysis:    Component Value Date/Time   COLORURINE AMBER (A) 08/30/2019 1916   APPEARANCEUR CLOUDY (A) 08/30/2019 1916   LABSPEC 1.014 08/30/2019 1916   PHURINE 6.0 08/30/2019 1916   GLUCOSEU NEGATIVE 08/30/2019 1916   GLUCOSEU NEGATIVE 06/08/2019 1109   HGBUR MODERATE (A) 08/30/2019 1916   HGBUR moderate 10/06/2009 1602   BILIRUBINUR NEGATIVE 08/30/2019 1916   BILIRUBINUR 1+ 03/16/2018 1327   KETONESUR NEGATIVE 08/30/2019 1916   PROTEINUR 100 (A) 08/30/2019 1916   UROBILINOGEN 0.2 06/08/2019 1109   NITRITE NEGATIVE 08/30/2019 1916   LEUKOCYTESUR LARGE (A) 08/30/2019 1916    Radiological Exams on Admission: Dg Chest 2 View  Result Date: 08/30/2019 CLINICAL DATA:  Incoherent since yesterday.  Fever. EXAM: CHEST - 2 VIEW COMPARISON:  CT 06/18/2019. Radiographs 11/13/2016. PET-CT 07/02/2019. FINDINGS: The heart size and mediastinal contours are stable. There is aortic atherosclerosis. There is new mild blunting of the left costophrenic angle laterally, suspicious for a small pleural effusion. There is no right-sided pleural effusion, pneumothorax, confluent airspace opacity or edema. Known small right upper lobe pulmonary nodule is not well seen. IMPRESSION: Possible small left pleural effusion. No other acute cardiopulmonary process. Electronically Signed   By: Richardean Sale M.D.   On: 08/30/2019 17:20    EKG: Independently reviewed.  Sinus rhythm, heart rate  91.  Assessment/Plan Principal Problem:   UTI (urinary tract infection) Active Problems:   Sepsis ([place])   Anemia   AKI (acute kidney injury) ([place])   Acute metabolic encephalopathy   Sepsis 2/2 UTI Febrile with temperature 102.2 F.  No leukocytosis.  Lactic acid normal.  Slightly tachycardic.  Not hypotensive. UA with large amount of leukocytes, 21-50 RBCs, 21-50 WBCs, and many bacteria on microscopic examination.  -IV fluid -Ceftriaxone -Urine culture pending -Blood culture x2 pending  Acute on chronic anemia Hemoglobin 6.9, was 9.2 on 08/18/2019.  Baseline in the 8-9 range.  FOBT negative.  Patient is not endorsing any hematemesis or hematochezia.  Endorses dark stools but does take iron supplementation.  Patient had an EGD done on 08/06/2019 and at that time her anemia was thought to be secondary to possible slow bleed from [facility] erosions and blood loss from recent knee replacement surgery.  GI had recommended outpatient hematology follow-up for her chronic anemia.  Patient has not seen Dr. Julien Nordmann since 2016. -Type and screen -1 unit PRBCs -Posttransfusion H&H -Anemia panel -Patient will need hematology follow-up.  Mild AKI BUN 42, creatinine 1.6.  Baseline creatinine 1.1-1.3.  Suspect prerenal from dehydration. -IV fluid hydration -Continue to monitor renal function -Monitor urine output  Small pleural effusion Chest x-ray showing mild blunting of the left costophrenic angle laterally, suspicious for small pleural effusion.  Patient is not hypoxic. -Check TSH, free T4 levels -Continuous pulse ox  Acute metabolic encephalopathy Patient's daughter brought her to the ED due to concern for confusion.  Does have a UTI which could be contributing.  At the time of my evaluation, patient did not appear confused and was AAO x4.  No meningeal signs. -Management of UTI as mentioned above  DVT prophylaxis: SCDs at this time Code Status: Patient wishes to be DNR. Family  Communication: No family available. Disposition Plan: Anticipate discharge after clinical improvement. Consults called: None Admission status: It is my clinical opinion  that referral for OBSERVATION is reasonable and necessary in this patient based on the above information provided. The aforementioned taken together are felt to place the patient at high risk for further clinical deterioration. However it is anticipated that the patient may be medically stable for discharge from the hospital within 24 to 48 hours.  The medical decision making on this patient was of high complexity and the patient is at high risk for clinical deterioration, therefore this is a level 3 visit.  Shela Leff MD [facility] Pager [phone]  If 7PM-7AM, please contact night-coverage [email or web address] Password [email or web address]  08/31/2019, 5:46 AM

## 2019-08-30 NOTE — ED Provider Notes (Signed)
[facility] DEPT Provider Note   CSN: [id number] Arrival date & time: 08/30/19  1606     History   Chief Complaint Chief Complaint  Patient presents with  . Altered Mental Status    confusion  . Fever    HPI Claudia Lara is a 85 y.o. female.     HPI Patient presents to the ED for evaluation of confusion and fever.  Patient was initially brought to the ED by her daughter because the daughter felt that the patient was confused since yesterday.  Patient's daughter attempted to call the patient at home and she would not answer the phone.  Patient denies have any specific complaints.  She states her daughter was concerned about her labs why she is here in the ED.  She denies having any trouble with fevers or cough.  No vomiting or diarrhea.  She has noted urinary frequency but that is a chronic issue for her.  She denies any known ill contacts.  When the patient checked into the emergency room she was noted to have a fever of 102.2.  Patient was not aware that she was having that issue. Past Medical History:  Diagnosis Date  . ANEMIA 08/22/2009  . Aortic stenosis   . AR (aortic regurgitation) 01/2019   Mild to Moderate, Noted on ECHO  . CAROTID ARTERY STENOSIS 01/16/2010  . Complication of anesthesia    very hard to wake up from surgery  . DYSPNEA ON EXERTION 11/16/2007  . Gallstones   . GEN OSTEOARTHROSIS INVOLVING MULTIPLE SITES 11/11/2008  . GERD (gastroesophageal reflux disease)   . Grade I diastolic dysfunction [id number]   Noted on ECHO  . Heart murmur   . HEMORRHOIDS, INTERNAL    surgery was in the 90's (per patient)  . History of blood transfusion    after hip and knee replacements  . NECK PAIN, ACUTE 12/28/2009  . PERIPHERAL EDEMA 10/06/2007  . PONV (postoperative nausea and vomiting)   . Pulmonary nodule 06/18/2019   noted on CT Chest   . PVD 10/06/2007  . Unspecified essential hypertension 10/19/2007    Patient Active Problem List    Diagnosis Date Noted  . Lower leg DVT (deep venous thromboembolism), acute, left ([place]) 08/18/2019  . Cameron ulcer, acute   . Gastric polyp   . Acute deep vein thrombosis (DVT) of calf muscle vein of left lower extremity ([place])   . Hypotension due to hypovolemia 08/04/2019  . Deficiency anemia 08/04/2019  . OA (osteoarthritis) of knee 07/26/2019  . Lung nodule 06/24/2019  . Bilateral leg edema 06/08/2019  . Zinc deficiency 10/03/2017  . Spondylosis, lumbar, with myelopathy 04/09/2017  . Hyperglycemia 03/05/2016  . Melena 08/03/2015  . GERD (gastroesophageal reflux disease) 08/03/2015  . Hemolytic anemia ([place]) 07/12/2015  . Chronic venous insufficiency 08/10/2014  . Aortic stenosis, mild 06/29/2014  . Left ventricular diastolic dysfunction, NYHA class 1 06/29/2014  . Senile osteopenia 04/21/2014  . Hyperlipidemia with target LDL less than 130 04/21/2014  . Kidney disease, chronic, stage III (GFR 30-59 ml/min) ([place]) 04/21/2014  . Routine health maintenance 07/16/2012  . Carotid artery stenosis without cerebral infarction 01/16/2010  . Essential hypertension 10/19/2007    Past Surgical History:  Procedure Laterality Date  . ABDOMINAL HYSTERECTOMY    . BIOPSY  08/06/2019   Procedure: BIOPSY;  Surgeon: Gatha Mayer, MD;  Location: [facility];  Service: Gastroenterology;;  . [name]     bilat  . CARPAL TUNNEL RELEASE    .  CHOLECYSTECTOMY  01/29/2016   at [facility]  . CHOLECYSTECTOMY    . ENDOSCOPIC RETROGRADE CHOLANGIOPANCREATOGRAPHY (ERCP) WITH PROPOFOL N/A 12/01/2015   Procedure: ENDOSCOPIC RETROGRADE CHOLANGIOPANCREATOGRAPHY (ERCP) WITH PROPOFOL;  Surgeon: [name], MD;  Location: [facility];  Service: Endoscopy;  Laterality: N/A;  . ENDOSCOPIC RETROGRADE CHOLANGIOPANCREATOGRAPHY (ERCP) WITH PROPOFOL N/A 02/01/2016   Procedure: ENDOSCOPIC RETROGRADE CHOLANGIOPANCREATOGRAPHY (ERCP) WITH PROPOFOL;  Surgeon: [name], MD;  Location: [facility];  Service: Endoscopy;  Laterality: N/A;  . ENDOSCOPIC RETROGRADE CHOLANGIOPANCREATOGRAPHY (ERCP) WITH PROPOFOL N/A 04/07/2018   Procedure: ENDOSCOPIC RETROGRADE CHOLANGIOPANCREATOGRAPHY (ERCP) WITH PROPOFOL;  Surgeon: [name], MD;  Location: [facility];  Service: Endoscopy;  Laterality: N/A;  . ESOPHAGOGASTRODUODENOSCOPY (EGD) WITH PROPOFOL N/A 08/06/2019   Procedure: ESOPHAGOGASTRODUODENOSCOPY (EGD) WITH PROPOFOL;  Surgeon: Gatha Mayer, MD;  Location: [facility];  Service: Gastroenterology;  Laterality: N/A;  . HAMMER TOE SURGERY    . HEMORRHOID SURGERY    . Hyperplastic colon polyps, removed  2007   By Dr. Penelope Coop  . JOINT REPLACEMENT  2011   right knee  . OOPHORECTOMY    . REMOVAL OF STONES  04/07/2018   Procedure: REMOVAL OF STONES;  Surgeon: [name], MD;  Location: [facility];  Service: Endoscopy;;  . ROTATOR CUFF REPAIR  2004   left (Dr. Durward Fortes)  . SPHINCTEROTOMY  04/07/2018   Procedure: SPHINCTEROTOMY;  Surgeon: [name], MD;  Location: [facility];  Service: Endoscopy;;  . TOTAL HIP ARTHROPLASTY Right 11/26/2016   Procedure: RIGHT TOTAL HIP ARTHROPLASTY;  Surgeon: Garald Balding, MD;  Location: [facility];  Service: Orthopedics;  Laterality: Right;  . TOTAL KNEE ARTHROPLASTY Right   . TOTAL KNEE ARTHROPLASTY Left 07/26/2019   Procedure: TOTAL KNEE ARTHROPLASTY;  Surgeon: Gaynelle Arabian, MD;  Location: [facility];  Service: Orthopedics;  Laterality: Left;  61min     OB History   No obstetric history on file.      Home Medications    Prior to Admission medications   Medication Sig Start Date End Date Taking? Authorizing Provider  aspirin 325 MG EC tablet Take 1 tablet (325 mg total) by mouth daily. 08/06/19  Yes Sheikh, Omair Latif, DO  Calcium Carb-Cholecalciferol (CALCIUM CARBONATE-VITAMIN D3 PO) Take 1 tablet by mouth daily.    Yes [provider]  carvedilol (COREG) 12.5 MG tablet TAKE 1 TABLET (12.5 MG TOTAL) BY MOUTH 2 (TWO)  TIMES DAILY WITH A MEAL. 07/17/19  Yes Janith Lima, MD  cholecalciferol (VITAMIN D) 1000 units tablet Take 1,000 Units by mouth daily.   Yes [provider]  ferrous sulfate 325 (65 FE) MG tablet Take 1 tablet (325 mg total) by mouth 2 (two) times daily with a meal. 07/28/19  Yes Constable, Amber, PA-C  gabapentin (NEURONTIN) 100 MG capsule Take 1 capsule (100 mg total) by mouth 3 (three) times daily. 1 tab 3x/day for 2 weeks. 1 tab 2x/day for 2 weeks. 1 tab a day for 2 weeks 07/28/19  Yes Constable, [employer], PA-C  Multiple Vitamin (MULTIVITAMIN WITH MINERALS) TABS tablet Take 1 tablet by mouth daily.   Yes [provider]  pantoprazole (PROTONIX) 40 MG tablet Take 1 tablet (40 mg total) by mouth daily. 06/08/19  Yes Janith Lima, MD  torsemide (DEMADEX) 20 MG tablet Take 1 tablet (20 mg total) by mouth daily. 06/08/19  Yes Janith Lima, MD  traMADol (ULTRAM) 50 MG tablet Take 1-2 tablets (50-100 mg total) by mouth every 12 (twelve)  hours as needed for moderate pain. 07/28/19  Yes Constable, Museum/gallery conservator, PA-C  vitamin C (ASCORBIC ACID) 500 MG tablet Take 500 mg by mouth daily.   Yes [provider]  zinc gluconate 50 MG tablet Take 1 tablet (50 mg total) daily by mouth. 10/03/17  Yes Janith Lima, MD  oxyCODONE (OXY IR/ROXICODONE) 5 MG immediate release tablet Take 1-2 tablets (5-10 mg total) by mouth every 4 (four) hours as needed for severe pain. Patient not taking: Reported on 08/30/2019 07/28/19   Ardeen Jourdain, PA-C    Family History Family History  Problem Relation Age of Onset  . Ovarian cancer Mother   . Cancer Mother   . Leukemia Father   . Lung cancer Father   . Cancer Father   . Cancer Brother   . Diabetes Neg Hx   . Heart disease Neg Hx   . Hypertension Neg Hx     Social History Social History   Tobacco Use  . Smoking status: Never Smoker  . Smokeless tobacco: Never Used  Substance Use Topics  . Alcohol use: No  . Drug use: No     Allergies    Sulfonamide derivatives   Review of Systems Review of Systems  All other systems reviewed and are negative.    Physical Exam Updated Vital Signs BP (!) 117/45 (BP Location: Left Arm)   Pulse 92   Temp 98.2 F (36.8 C) (Oral)   Resp 19   SpO2 96%   Physical Exam Vitals signs and nursing note reviewed.  Constitutional:      General: She is not in acute distress.    Appearance: She is well-developed.  HENT:     Head: Normocephalic and atraumatic.     Right Ear: External ear normal.     Left Ear: External ear normal.  Eyes:     General: No scleral icterus.       Right eye: No discharge.        Left eye: No discharge.     Conjunctiva/sclera: Conjunctivae normal.  Neck:     Musculoskeletal: Normal range of motion and neck supple. No neck rigidity or muscular tenderness.     Trachea: No tracheal deviation.  Cardiovascular:     Rate and Rhythm: Normal rate and regular rhythm.  Pulmonary:     Effort: Pulmonary effort is normal. No respiratory distress.     Breath sounds: Normal breath sounds. No stridor. No wheezing or rales.  Abdominal:     General: Bowel sounds are normal. There is no distension.     Palpations: Abdomen is soft.     Tenderness: There is no abdominal tenderness. There is no guarding or rebound.  Genitourinary:    Comments: Brown stool Musculoskeletal:        General: No tenderness.  Skin:    General: Skin is warm and dry.     Findings: No rash.  Neurological:     General: No focal deficit present.     Mental Status: She is alert and oriented to person, place, and time.     Cranial Nerves: No cranial nerve deficit (no facial droop, extraocular movements intact, no slurred speech).     Sensory: No sensory deficit.     Motor: No abnormal muscle tone or seizure activity.     Coordination: Coordination normal.     Comments: Patient is alert and awake, she is answering questions appropriately      ED Treatments / Results  Labs (all labs ordered  are listed, but only abnormal results are displayed) Labs Reviewed  COMPREHENSIVE METABOLIC PANEL - Abnormal; Notable for the following components:      Result Value   CO2 21 (*)    Glucose, Bld 138 (*)    BUN 42 (*)    Creatinine, Ser 1.62 (*)    Calcium 8.4 (*)    Total Bilirubin 2.9 (*)    GFR calc non Af Amer 29 (*)    GFR calc Af Amer 33 (*)    All other components within normal limits  CBC WITH DIFFERENTIAL/PLATELET - Abnormal; Notable for the following components:   RBC 2.28 (*)    Hemoglobin 6.9 (*)    HCT 21.5 (*)    RDW 18.6 (*)    Lymphs Abs 0.6 (*)    All other components within normal limits  URINALYSIS, ROUTINE W REFLEX MICROSCOPIC - Abnormal; Notable for the following components:   Color, Urine AMBER (*)    APPearance CLOUDY (*)    Hgb urine dipstick MODERATE (*)    Protein, ur 100 (*)    Leukocytes,Ua LARGE (*)    Bacteria, UA MANY (*)    All other components within normal limits  CULTURE, BLOOD (ROUTINE X 2)  CULTURE, BLOOD (ROUTINE X 2)  SARS CORONAVIRUS 2 (HOSPITAL ORDER, [facility])  LACTIC ACID, PLASMA  POC OCCULT BLOOD, ED  TYPE AND SCREEN    EKG EKG Interpretation  Date/Time:  Monday August 30 2019 16:25:08 EDT Ventricular Rate:  91 PR Interval:    QRS Duration: 80 QT Interval:  348 QTC Calculation: 429 R Axis:   70 Text Interpretation:  Sinus rhythm Baseline wander in lead(s) V5 No significant change since last tracing Confirmed by Dorie Rank [phone]) on 08/30/2019 7:02:09 PM   Radiology Dg Chest 2 View  Result Date: 08/30/2019 CLINICAL DATA:  Incoherent since yesterday.  Fever. EXAM: CHEST - 2 VIEW COMPARISON:  CT 06/18/2019. Radiographs 11/13/2016. PET-CT 07/02/2019. FINDINGS: The heart size and mediastinal contours are stable. There is aortic atherosclerosis. There is new mild blunting of the left costophrenic angle laterally, suspicious for a small pleural effusion. There is no right-sided pleural effusion,  pneumothorax, confluent airspace opacity or edema. Known small right upper lobe pulmonary nodule is not well seen. IMPRESSION: Possible small left pleural effusion. No other acute cardiopulmonary process. Electronically Signed   By: Richardean Sale M.D.   On: 08/30/2019 17:20    Procedures .Critical Care Performed by: Dorie Rank, MD Authorized by: Dorie Rank, MD   Critical care provider statement:    Critical care time (minutes):  35   Critical care was time spent personally by me on the following activities:  Discussions with consultants, evaluation of patient's response to treatment, examination of patient, ordering and performing treatments and interventions, ordering and review of laboratory studies, ordering and review of radiographic studies, pulse oximetry, re-evaluation of patient's condition, obtaining history from patient or surrogate and review of old charts   (including critical care time)  Medications Ordered in ED Medications  sodium chloride 0.9 % bolus 500 mL (500 mLs Intravenous New Bag/Given 08/30/19 2102)    Followed by  0.9 %  sodium chloride infusion (1,000 mLs Intravenous New Bag/Given 08/30/19 2103)     Initial Impression / Assessment and Plan / ED Course  I have reviewed the triage vital signs and the nursing notes.  Pertinent labs & imaging results that were available during my care of the patient were reviewed by  me and considered in my medical decision making (see chart for details).  Clinical Course as of Aug 29 2105  Mon Aug 30, 2019  2023 Urinalysis is consistent with urinary tract infection.   [JK]  2023 Elevated BUN and creatinine   [JK]  2024 Hemoglobin is down to 6.9   [JK]  2024 Decreased from 9.2   [JK]    Clinical Course User Index [JK] Dorie Rank, MD     Patient presented to the emergency room for evaluation of confusion.  In the ED, the patient was alert without displaying any signs of confusion.  Patient did have a fever up to 102.  I  suspect that was causing her confusion earlier.  She is not having a headache and no signs of meningismus.  I suspect the etiology is related to her urinary tract infection.  Patient was also notably anemic.  She has noticed dark stools but has been taking iron since her knee surgery.  Hemoccult test is negative.  I will send off anemia panel and order a blood transfusion.  Final Clinical Impressions(s) / ED Diagnoses   Final diagnoses:  Anemia, unspecified type  Acute cystitis without hematuria      Dorie Rank, MD 08/30/19 2119

## 2019-08-31 DIAGNOSIS — Z8041 Family history of malignant neoplasm of ovary: Secondary | ICD-10-CM | POA: Diagnosis not present

## 2019-08-31 DIAGNOSIS — Z96641 Presence of right artificial hip joint: Secondary | ICD-10-CM | POA: Diagnosis present

## 2019-08-31 DIAGNOSIS — Z882 Allergy status to sulfonamides status: Secondary | ICD-10-CM | POA: Diagnosis not present

## 2019-08-31 DIAGNOSIS — D649 Anemia, unspecified: Secondary | ICD-10-CM | POA: Diagnosis not present

## 2019-08-31 DIAGNOSIS — J90 Pleural effusion, not elsewhere classified: Secondary | ICD-10-CM | POA: Diagnosis present

## 2019-08-31 DIAGNOSIS — I129 Hypertensive chronic kidney disease with stage 1 through stage 4 chronic kidney disease, or unspecified chronic kidney disease: Secondary | ICD-10-CM | POA: Diagnosis present

## 2019-08-31 DIAGNOSIS — I739 Peripheral vascular disease, unspecified: Secondary | ICD-10-CM | POA: Diagnosis present

## 2019-08-31 DIAGNOSIS — R600 Localized edema: Secondary | ICD-10-CM | POA: Diagnosis present

## 2019-08-31 DIAGNOSIS — Z66 Do not resuscitate: Secondary | ICD-10-CM | POA: Diagnosis present

## 2019-08-31 DIAGNOSIS — R000 Tachycardia, unspecified: Secondary | ICD-10-CM | POA: Diagnosis not present

## 2019-08-31 DIAGNOSIS — Z9049 Acquired absence of other specified parts of digestive tract: Secondary | ICD-10-CM | POA: Diagnosis not present

## 2019-08-31 DIAGNOSIS — Z86010 Personal history of colonic polyps: Secondary | ICD-10-CM | POA: Diagnosis not present

## 2019-08-31 DIAGNOSIS — A419 Sepsis, unspecified organism: Secondary | ICD-10-CM

## 2019-08-31 DIAGNOSIS — N3000 Acute cystitis without hematuria: Secondary | ICD-10-CM

## 2019-08-31 DIAGNOSIS — N183 Chronic kidney disease, stage 3 unspecified: Secondary | ICD-10-CM | POA: Diagnosis present

## 2019-08-31 DIAGNOSIS — K219 Gastro-esophageal reflux disease without esophagitis: Secondary | ICD-10-CM | POA: Diagnosis present

## 2019-08-31 DIAGNOSIS — I352 Nonrheumatic aortic (valve) stenosis with insufficiency: Secondary | ICD-10-CM | POA: Diagnosis present

## 2019-08-31 DIAGNOSIS — D696 Thrombocytopenia, unspecified: Secondary | ICD-10-CM | POA: Diagnosis present

## 2019-08-31 DIAGNOSIS — D509 Iron deficiency anemia, unspecified: Secondary | ICD-10-CM | POA: Diagnosis present

## 2019-08-31 DIAGNOSIS — N179 Acute kidney failure, unspecified: Secondary | ICD-10-CM

## 2019-08-31 DIAGNOSIS — R17 Unspecified jaundice: Secondary | ICD-10-CM | POA: Diagnosis present

## 2019-08-31 DIAGNOSIS — G9341 Metabolic encephalopathy: Secondary | ICD-10-CM | POA: Diagnosis present

## 2019-08-31 DIAGNOSIS — Z806 Family history of leukemia: Secondary | ICD-10-CM | POA: Diagnosis not present

## 2019-08-31 DIAGNOSIS — N390 Urinary tract infection, site not specified: Secondary | ICD-10-CM | POA: Diagnosis not present

## 2019-08-31 DIAGNOSIS — Z90710 Acquired absence of both cervix and uterus: Secondary | ICD-10-CM | POA: Diagnosis not present

## 2019-08-31 DIAGNOSIS — J9811 Atelectasis: Secondary | ICD-10-CM | POA: Diagnosis not present

## 2019-08-31 DIAGNOSIS — Z20828 Contact with and (suspected) exposure to other viral communicable diseases: Secondary | ICD-10-CM | POA: Diagnosis present

## 2019-08-31 DIAGNOSIS — Z96653 Presence of artificial knee joint, bilateral: Secondary | ICD-10-CM | POA: Diagnosis present

## 2019-08-31 LAB — CBC
HCT: 23.3 % — ABNORMAL LOW (ref 36.0–46.0)
HCT: 26.5 % — ABNORMAL LOW (ref 36.0–46.0)
Hemoglobin: 7.3 g/dL — ABNORMAL LOW (ref 12.0–15.0)
Hemoglobin: 8.1 g/dL — ABNORMAL LOW (ref 12.0–15.0)
MCH: 30.3 pg (ref 26.0–34.0)
MCH: 29.6 pg (ref 26.0–34.0)
MCHC: 31.3 g/dL (ref 30.0–36.0)
MCHC: 30.6 g/dL (ref 30.0–36.0)
MCV: 96.7 fL (ref 80.0–100.0)
MCV: 96.7 fL (ref 80.0–100.0)
Platelets: 90 10*3/uL — ABNORMAL LOW (ref 150–400)
Platelets: 139 10*3/uL — ABNORMAL LOW (ref 150–400)
RBC: 2.41 MIL/uL — ABNORMAL LOW (ref 3.87–5.11)
RBC: 2.74 MIL/uL — ABNORMAL LOW (ref 3.87–5.11)
RDW: 17 % — ABNORMAL HIGH (ref 11.5–15.5)
RDW: 17.4 % — ABNORMAL HIGH (ref 11.5–15.5)
WBC: 4.9 10*3/uL (ref 4.0–10.5)
WBC: 6.1 10*3/uL (ref 4.0–10.5)
nRBC: 0 % (ref 0.0–0.2)
nRBC: 0 % (ref 0.0–0.2)

## 2019-08-31 LAB — IRON AND TIBC
Iron: 13 ug/dL — ABNORMAL LOW (ref 28–170)
Saturation Ratios: 7 % — ABNORMAL LOW (ref 10.4–31.8)
TIBC: 179 ug/dL — ABNORMAL LOW (ref 250–450)
UIBC: 166 ug/dL

## 2019-08-31 LAB — BASIC METABOLIC PANEL
Anion gap: 3 — ABNORMAL LOW (ref 5–15)
BUN: 36 mg/dL — ABNORMAL HIGH (ref 8–23)
CO2: 25 mmol/L (ref 22–32)
Calcium: 7.1 mg/dL — ABNORMAL LOW (ref 8.9–10.3)
Chloride: 110 mmol/L (ref 98–111)
Creatinine, Ser: 1.34 mg/dL — ABNORMAL HIGH (ref 0.44–1.00)
GFR calc Af Amer: 42 mL/min — ABNORMAL LOW (ref >60)
GFR calc non Af Amer: 36 mL/min — ABNORMAL LOW (ref >60)
Glucose, Bld: 136 mg/dL — ABNORMAL HIGH (ref 70–99)
Potassium: 3.5 mmol/L (ref 3.5–5.1)
Sodium: 138 mmol/L (ref 135–145)

## 2019-08-31 LAB — FOLATE: Folate: >24.8 ng/mL (ref >5.9)

## 2019-08-31 LAB — VITAMIN B12: Vitamin B-12: 357 pg/mL (ref 180–914)

## 2019-08-31 LAB — HEMOGLOBIN AND HEMATOCRIT, BLOOD
HCT: 18 % — ABNORMAL LOW (ref 36.0–46.0)
Hemoglobin: 5.8 g/dL — CL (ref 12.0–15.0)

## 2019-08-31 LAB — FERRITIN: Ferritin: 534 ng/mL — ABNORMAL HIGH (ref 11–307)

## 2019-08-31 LAB — TSH: TSH: 0.797 u[IU]/mL (ref 0.350–4.500)

## 2019-08-31 LAB — T4, FREE: Free T4: 1.17 ng/dL — ABNORMAL HIGH (ref 0.61–1.12)

## 2019-08-31 MED ORDER — SODIUM CHLORIDE 0.9 % IV SOLN
2.0000 g | INTRAVENOUS | Status: DC
  Administered 2019-08-31 – 2019-09-02 (×3): 2 g via INTRAVENOUS
  Filled 2019-08-31 (×3): qty 2

## 2019-08-31 MED ORDER — METOPROLOL TARTRATE 25 MG PO TABS: 25.0000 mg | ORAL_TABLET | Freq: Two times a day (BID) | ORAL | Status: DC

## 2019-08-31 MED ORDER — ONDANSETRON HCL 4 MG/2ML IJ SOLN
4.0000 mg | Freq: Four times a day (QID) | INTRAMUSCULAR | Status: DC | PRN
  Administered 2019-08-31: 13:00:00 4 mg via INTRAVENOUS
  Filled 2019-08-31: qty 2

## 2019-08-31 MED ORDER — CARVEDILOL 6.25 MG PO TABS
6.2500 mg | ORAL_TABLET | Freq: Two times a day (BID) | ORAL | Status: DC
  Administered 2019-08-31 – 2019-09-01 (×2): 6.25 mg via ORAL
  Filled 2019-08-31 (×2): qty 1

## 2019-08-31 MED ORDER — SODIUM CHLORIDE 0.9 % IV BOLUS
500.0000 mL | Freq: Once | INTRAVENOUS | Status: AC
  Administered 2019-08-31: 500 mL via INTRAVENOUS

## 2019-08-31 MED ORDER — SODIUM CHLORIDE 0.9 % IV SOLN
INTRAVENOUS | Status: DC
  Administered 2019-08-31 – 2019-09-02 (×4): via INTRAVENOUS

## 2019-08-31 NOTE — Progress Notes (Signed)
PROGRESS NOTE  Claudia Lara [id number] DOB: 05-03-1932   PCP: Janith Lima, MD  Patient is from: Home  DOA: 08/30/2019 LOS: 0  Brief Narrative / Interim history: 85 y.o. female with medical history significant of anemia, carotid artery stenosis, GERD, hemorrhoids, hypertension, PVD presenting to the hospital for evaluation of confusion.  Admitted with sepsis due to UTI, anemia and AKI.  In ED, AAO x4.  Febrile to 102.2.  Hgb 6.9 (from 9.2 on 9/23).  Lactic acid normal.  COVID-19 negative.  Creatinine 1.6 (baseline 1.1-1.3).  BUN 42.  Total bili 2.9.  LFT normal.  Lactic acid negative.  Urinalysis concerning for UTI.  FOBT negative.  CXR revealed mild blunting of left costophrenic angle suspicious for small pleural effusion.  Started on IV ceftriaxone.  One unit of pRBC ordered.  Admitted for sepsis due to UTI, anemia and AKI.  Subjective: No major events overnight of this morning.  Hemoglobin dropped further to 5.8 but back up to 7.3 after recheck.  Has no complaints other than urinary frequency and urgency.  Reports left lower extremity swelling which is chronic for her.  She says she takes torsemide for this.  Denies chest pain, dyspnea, cough, nausea, vomiting or abdominal pain.  Objective: Vitals:   08/31/19 0700 08/31/19 0800 08/31/19 0900 08/31/19 1100  BP: (!) 119/43 (!) 111/44 108/66 (!) 133/47  Pulse: 94 95 88 94  Resp: (!) 28 (!) 21 (!) 27 (!) 24  Temp:      TempSrc:      SpO2: 93% 95% 93% 93%    Intake/Output Summary (Last 24 hours) at 08/31/2019 1240 Last data filed at 08/31/2019 0909 Gross per 24 hour  Intake 1175 ml  Output -  Net 1175 ml   There were no vitals filed for this visit.  Examination:  GENERAL: No acute distress.  Appears well.  HEENT: MMM.  Vision and hearing grossly intact.  NECK: Supple.  No apparent JVD.  RESP:  No IWOB.  Bibasilar crackles. CVS:  RRR. Heart sounds normal.  ABD/GI/GU: Bowel sounds present. Soft. Non tender.  MSK/EXT:   Moves extremities.  Trace edema in left lower extremity.  Healed surgical wound over left knee.  No calf tenderness. SKIN: no apparent skin lesion or wound NEURO: Awake, alert and oriented appropriately.  No gross deficit.  PSYCH: Calm. Normal affect.   Assessment & Plan: Sepsis likely due to UTI: Febrile, tachycardic and tachypneic again today.  Lactic acid normal.  Has UTI symptoms.  Urinalysis concerning.  No history of ESBL or MDR organisms on previous cultures.  Blood cultures negative so far. -We will start IV normal saline at 100 cc an hour -Continue IV ceftriaxone -Follow urine cultures and narrow antibiotics as appropriate.  Iron deficiency anemia: Hgb 6.9 (baseline 8-9) and dropped further to 5.8 likely erroneous lab.  Repeat Hgb 7.3.  Iron saturation 7%. FOBT is negative.  She is  asymptomatic from this.  History of hemolytic anemia in a chart. -Monitor H&H. -We will give IV iron once infection under good control. -Check haptoglobin.  AKI on CKD-3 with mild azotemia: suspect some prerenal etiology.  Not on nephrotoxic meds.  Improved with hydration. -IV fluid as above -Continue monitoring. -Avoid nephrotoxic meds.  Mild hyperbilirubinemia: Appears chronic. -Recheck  Chronic lower extremity edema -We will resume home torsemide once hemodynamically stable.  Essential hypertension//tachycardia -Continue home Coreg at reduced dose.  Acute metabolic encephalopathy: Could be due to sepsis from UTI.  Resolved. -Treat UTI as  above.  Thrombocytopenia: Dilutional? -Recheck CBC in the afternoon. -Check haptoglobin.  DVT prophylaxis: SCD Code Status: DNR/DNI Family Communication: Patient and/or RN. Available if any question. Disposition Plan: Remains inpatient.  Patient is hemodynamically unstable.  She is febrile, tachycardic and tachypneic.  Also with emesis.  Hgb marginal.  Will change status to inpatient. Consultants: None  Procedures:  None  Microbiology  summarized: COVID-19 screen negative. Blood cultures negative so far. Urine culture pending.  Antimicrobials: Anti-infectives (From admission, onward)   Start     Dose/Rate Route Frequency Ordered Stop   08/31/19 2100  cefTRIAXone (ROCEPHIN) 1 g in sodium chloride 0.9 % 100 mL IVPB     1 g 200 mL/hr over 30 Minutes Intravenous Every 24 hours 08/30/19 2329     08/30/19 2145  cefTRIAXone (ROCEPHIN) 1 g in sodium chloride 0.9 % 100 mL IVPB     1 g 200 mL/hr over 30 Minutes Intravenous  Once 08/30/19 2134 08/31/19 0016      Sch Meds:  Scheduled Meds: . cholecalciferol  1,000 Units Oral Daily  . ferrous sulfate  325 mg Oral BID WC  . gabapentin  100 mg Oral TID  . multivitamin with minerals  1 tablet Oral Daily  . pantoprazole  40 mg Oral Daily  . vitamin C  500 mg Oral Daily  . zinc sulfate  220 mg Oral Daily   Continuous Infusions: . sodium chloride Stopped (08/31/19 0532)  . cefTRIAXone (ROCEPHIN)  IV     PRN Meds:.acetaminophen **OR** acetaminophen   I have personally reviewed the following labs and images: CBC: Recent Labs  Lab 08/30/19 1903 08/31/19 0536 08/31/19 0750  WBC 6.3  --  4.9  NEUTROABS 4.9  --   --   HGB 6.9* 5.8* 7.3*  HCT 21.5* 18.0* 23.3*  MCV 94.3  --  96.7  PLT 174  --  90*   BMP &GFR Recent Labs  Lab 08/30/19 1903 08/31/19 0536  NA 136 138  K 4.3 3.5  CL 106 110  CO2 21* 25  GLUCOSE 138* 136*  BUN 42* 36*  CREATININE 1.62* 1.34*  CALCIUM 8.4* 7.1*   Estimated Creatinine Clearance: 30.3 mL/min (A) (by C-G formula based on SCr of 1.34 mg/dL (H)). Liver & Pancreas: Recent Labs  Lab 08/30/19 1903  AST 22  ALT 16  ALKPHOS 62  BILITOT 2.9*  PROT 6.6  ALBUMIN 4.0   No results for input(s): LIPASE, AMYLASE in the last 168 hours. No results for input(s): AMMONIA in the last 168 hours. Diabetic: No results for input(s): HGBA1C in the last 72 hours. No results for input(s): GLUCAP in the last 168 hours. Cardiac Enzymes: No results  for input(s): CKTOTAL, CKMB, CKMBINDEX, TROPONINI in the last 168 hours. Recent Labs    08/04/19 1054  PROBNP 305.0*   Coagulation Profile: No results for input(s): INR, PROTIME in the last 168 hours. Thyroid Function Tests: Recent Labs    08/31/19 0536  TSH 0.797  FREET4 1.17*   Lipid Profile: No results for input(s): CHOL, HDL, LDLCALC, TRIG, CHOLHDL, LDLDIRECT in the last 72 hours. Anemia Panel: Recent Labs    08/30/19 2118  VITAMINB12 357  FOLATE >24.8  FERRITIN 534*  TIBC 179*  IRON 13*  RETICCTPCT 3.4*   Urine analysis:    Component Value Date/Time   COLORURINE AMBER (A) 08/30/2019 1916   APPEARANCEUR CLOUDY (A) 08/30/2019 1916   LABSPEC 1.014 08/30/2019 1916   PHURINE 6.0 08/30/2019 1916   GLUCOSEU NEGATIVE 08/30/2019  [place] 06/08/2019 1109   HGBUR MODERATE (A) 08/30/2019 1916   HGBUR moderate 10/06/2009 [place] 08/30/2019 1916   BILIRUBINUR 1+ 03/16/2018 [place] 08/30/2019 1916   PROTEINUR 100 (A) 08/30/2019 1916   UROBILINOGEN 0.2 06/08/2019 1109   NITRITE NEGATIVE 08/30/2019 1916   [place] (A) 08/30/2019 1916   Sepsis Labs: Invalid input(s): PROCALCITONIN, [place]  Microbiology: Recent Results (from the past 240 hour(s))  Blood culture (routine x 2)     Status: None (Preliminary result)   Collection Time: 08/30/19  7:03 PM   Specimen: BLOOD  Result Value Ref Range Status   Specimen Description   Final    BLOOD RIGHT WRIST Performed at [facility] [address] [place], [place] [address]    Special Requests   Final    BOTTLES DRAWN AEROBIC AND ANAEROBIC Blood Culture adequate volume Performed at [facility] [address], [place], [place] [address]    Culture   Final    NO GROWTH < 24 HOURS Performed at [facility] [address], [place], [place] [address]    Report Status PENDING  Incomplete  Blood culture (routine  x 2)     Status: None (Preliminary result)   Collection Time: 08/30/19  7:07 PM   Specimen: BLOOD  Result Value Ref Range Status   Specimen Description   Final    BLOOD LEFT ANTECUBITAL Performed at [facility] [address] [place], [place] [address]    Special Requests   Final    BOTTLES DRAWN AEROBIC AND ANAEROBIC Blood Culture adequate volume Performed at [facility] [address], [place], [place] [address]    Culture   Final    NO GROWTH < 24 HOURS Performed at [facility] [address], [place],  [address]    Report Status PENDING  Incomplete  SARS Coronavirus 2 [facility] order, Performed in [facility] hospital lab) Nasopharyngeal Nasopharyngeal Swab     Status: None   Collection Time: 08/30/19  9:00 PM   Specimen: Nasopharyngeal Swab  Result Value Ref Range Status   SARS Coronavirus 2 NEGATIVE NEGATIVE Final    Comment: (NOTE) If result is NEGATIVE SARS-CoV-2 target nucleic acids are NOT DETECTED. The SARS-CoV-2 RNA is generally detectable in upper and lower  respiratory specimens during the acute phase of infection. The lowest  concentration of SARS-CoV-2 viral copies this assay can detect is 250  copies / mL. A negative result does not preclude SARS-CoV-2 infection  and should not be used as the sole basis for treatment or other  patient management decisions.  A negative result may occur with  improper specimen collection / handling, submission of specimen other  than nasopharyngeal swab, presence of viral mutation(s) within the  areas targeted by this assay, and inadequate number of viral copies  (<250 copies / mL). A negative result must be combined with clinical  observations, patient history, and epidemiological information. If result is POSITIVE SARS-CoV-2 target nucleic acids are DETECTED. The SARS-CoV-2 RNA is generally detectable in upper and lower  respiratory specimens dur ing the acute phase of  infection.  Positive  results are indicative of active infection with SARS-CoV-2.  Clinical  correlation with patient history and other diagnostic information is  necessary to determine patient infection status.  Positive results do  not rule out bacterial infection or co-infection with other viruses. If result  is PRESUMPTIVE POSTIVE SARS-CoV-2 nucleic acids MAY BE PRESENT.   A presumptive positive result was obtained on the submitted specimen  and confirmed on repeat testing.  While 2019 novel coronavirus  (SARS-CoV-2) nucleic acids may be present in the submitted sample  additional confirmatory testing may be necessary for epidemiological  and / or clinical management purposes  to differentiate between  SARS-CoV-2 and other Sarbecovirus currently known to infect humans.  If clinically indicated additional testing with an alternate test  methodology [phone]) is advised. The SARS-CoV-2 RNA is generally  detectable in upper and lower respiratory sp ecimens during the acute  phase of infection. The expected result is Negative. Fact Sheet for Patients:  StrictlyIdeas.no Fact Sheet for Healthcare Providers: BankingDealers.co.za This test is not yet approved or cleared by the [place] FDA and has been authorized for detection and/or diagnosis of SARS-CoV-2 by FDA under an Emergency Use Authorization (EUA).  This EUA will remain in effect (meaning this test can be used) for the duration of the COVID-19 declaration under Section 564(b)(1) of the Act, 21 U.S.C. section 360bbb-3(b)(1), unless the authorization is terminated or revoked sooner. Performed at [facility], [place] [address] [place], [place] [address]     Radiology Studies: Dg Chest 2 View  Result Date: 08/30/2019 CLINICAL DATA:  Incoherent since yesterday.  Fever. EXAM: CHEST - 2 VIEW COMPARISON:  CT 06/18/2019. Radiographs 11/13/2016. PET-CT 07/02/2019.  FINDINGS: The heart size and mediastinal contours are stable. There is aortic atherosclerosis. There is new mild blunting of the left costophrenic angle laterally, suspicious for a small pleural effusion. There is no right-sided pleural effusion, pneumothorax, confluent airspace opacity or edema. Known small right upper lobe pulmonary nodule is not well seen. IMPRESSION: Possible small left pleural effusion. No other acute cardiopulmonary process. Electronically Signed   By: Richardean Sale M.D.   On: 08/30/2019 17:20   35 minutes with more than 50% spent in reviewing records, counseling patient and coordinating care.  Yovanna Cogan T. [place]  If 7PM-7AM, please contact night-coverage [email or web address] Password [email or web address] 08/31/2019, 12:40 PM

## 2019-08-31 NOTE — ED Notes (Signed)
Will collect CBC once blood administration is complete.

## 2019-08-31 NOTE — ED Notes (Signed)
IV team consult placed for second IV access to start blood transfusion.

## 2019-09-01 ENCOUNTER — Inpatient Hospital Stay (HOSPITAL_COMMUNITY): Payer: Medicare Other

## 2019-09-01 DIAGNOSIS — R000 Tachycardia, unspecified: Secondary | ICD-10-CM

## 2019-09-01 DIAGNOSIS — R6520 Severe sepsis without septic shock: Secondary | ICD-10-CM

## 2019-09-01 DIAGNOSIS — R0682 Tachypnea, not elsewhere classified: Secondary | ICD-10-CM

## 2019-09-01 LAB — CBC
HCT: 23.2 % — ABNORMAL LOW (ref 36.0–46.0)
Hemoglobin: 7.2 g/dL — ABNORMAL LOW (ref 12.0–15.0)
MCH: 29.4 pg (ref 26.0–34.0)
MCHC: 31 g/dL (ref 30.0–36.0)
MCV: 94.7 fL (ref 80.0–100.0)
Platelets: 147 10*3/uL — ABNORMAL LOW (ref 150–400)
RBC: 2.45 MIL/uL — ABNORMAL LOW (ref 3.87–5.11)
RDW: 17.4 % — ABNORMAL HIGH (ref 11.5–15.5)
WBC: 5.5 10*3/uL (ref 4.0–10.5)
nRBC: 0 % (ref 0.0–0.2)

## 2019-09-01 LAB — COMPREHENSIVE METABOLIC PANEL
ALT: 19 U/L (ref 0–44)
AST: 24 U/L (ref 15–41)
Albumin: 2.9 g/dL — ABNORMAL LOW (ref 3.5–5.0)
Alkaline Phosphatase: 51 U/L (ref 38–126)
Anion gap: 11 (ref 5–15)
BUN: 34 mg/dL — ABNORMAL HIGH (ref 8–23)
CO2: 19 mmol/L — ABNORMAL LOW (ref 22–32)
Calcium: 7.9 mg/dL — ABNORMAL LOW (ref 8.9–10.3)
Chloride: 110 mmol/L (ref 98–111)
Creatinine, Ser: 1.35 mg/dL — ABNORMAL HIGH (ref 0.44–1.00)
GFR calc Af Amer: 41 mL/min — ABNORMAL LOW (ref >60)
GFR calc non Af Amer: 36 mL/min — ABNORMAL LOW (ref >60)
Glucose, Bld: 124 mg/dL — ABNORMAL HIGH (ref 70–99)
Potassium: 4.3 mmol/L (ref 3.5–5.1)
Sodium: 140 mmol/L (ref 135–145)
Total Bilirubin: 1.5 mg/dL — ABNORMAL HIGH (ref 0.3–1.2)
Total Protein: 5.3 g/dL — ABNORMAL LOW (ref 6.5–8.1)

## 2019-09-01 LAB — HAPTOGLOBIN: Haptoglobin: 158 mg/dL (ref 41–333)

## 2019-09-01 LAB — PREPARE RBC (CROSSMATCH)

## 2019-09-01 LAB — MAGNESIUM: Magnesium: 2.3 mg/dL (ref 1.7–2.4)

## 2019-09-01 MED ORDER — SODIUM CHLORIDE 0.9% IV SOLUTION
Freq: Once | INTRAVENOUS | Status: AC
  Administered 2019-09-01: 17:00:00 via INTRAVENOUS

## 2019-09-01 MED ORDER — CARVEDILOL 3.125 MG PO TABS
3.1250 mg | ORAL_TABLET | Freq: Two times a day (BID) | ORAL | Status: DC
  Administered 2019-09-01 – 2019-09-03 (×4): 3.125 mg via ORAL
  Filled 2019-09-01 (×4): qty 1

## 2019-09-01 NOTE — Progress Notes (Addendum)
PROGRESS NOTE  Claudia Lara [id number] DOB: 1932/06/21   PCP: Janith Lima, MD  Patient is from: Home  DOA: 08/30/2019 LOS: 1  Brief Narrative / Interim history: 85 y.o. female with medical history significant of anemia, carotid artery stenosis, GERD, hemorrhoids, hypertension, PVD presenting to the hospital for evaluation of confusion.  Admitted with sepsis due to UTI, anemia and AKI.  In ED, AAO x4.  Febrile to 102.2.  Hgb 6.9 (from 9.2 on 9/23).  Lactic acid normal.  COVID-19 negative.  Creatinine 1.6 (baseline 1.1-1.3).  BUN 42.  Total bili 2.9.  LFT normal.  Lactic acid negative.  Urinalysis concerning for UTI.  FOBT negative.  CXR revealed mild blunting of left costophrenic angle suspicious for small pleural effusion.  Started on IV ceftriaxone.  One unit of pRBC ordered.  Admitted for sepsis due to UTI, anemia and AKI.  Subjective: Reportedly desaturated and put on Ventimask last night.  Also tachycardic 110s and tachypneic to 30s.  Also had a T-max of 100.3 last night.  Fever curve downtrending.  She has no respiratory complaints.  Portable chest x-ray without acute finding.  No complaints this morning.  Objective: Vitals:   09/01/19 0807 09/01/19 0913 09/01/19 0951 09/01/19 1102  BP: (!) 116/49 (!) 111/46 (!) 117/43 (!) 116/42  Pulse: 89 86 86 90  Resp: (!) 32 (!) 34 (!) 28 (!) 28  Temp: 99.3 F (37.4 C) 98.1 F (36.7 C) 98.4 F (36.9 C) 99.7 F (37.6 C)  TempSrc: Oral  Oral Oral  SpO2: 97% 92% 95% 99%    Intake/Output Summary (Last 24 hours) at 09/01/2019 1402 Last data filed at 09/01/2019 0814 Gross per 24 hour  Intake 3065.52 ml  Output 1775 ml  Net 1290.52 ml   There were no vitals filed for this visit.  Examination:  GENERAL: No acute distress.  Appears well.  HEENT: MMM.  Vision and hearing grossly intact.  NECK: Supple.  No apparent JVD.  RESP: On Venturi mask.  No IWOB.  Improved aeration. CVS:  RRR. Heart sounds normal.  ABD/GI/GU: Bowel sounds  present. Soft. Non tender.  MSK/EXT:  Moves extremities.  Trace edema in left lower extremity.  Healed surgical wound over left knee.  SKIN: no apparent skin lesion or wound NEURO: Awake, alert and oriented appropriately.  No gross deficit.  PSYCH: Calm. Normal affect.   Assessment & Plan: Sepsis likely due to UTI: still with tachypnea and tachycardia.  Lactic acid normal. No history of ESBL or MDR organisms on previous cultures.  Blood cultures negative so far.  Unfortunately, urine culture was not sent on admission. -Decrease IV NS to 50 cc an hour. -Continue IV ceftriaxone -We will send urine culture-can help excluding ESBL.  Iron deficiency anemia: FOBT is negative.  History of hemolytic anemia but haptoglobin normal.  Iron saturation 7%.   -Hgb 6.9 (admit)>1u>5.8>1u>7.3>8.1>7.2>1u -Suspect hemoconcentration and falsely elevated Hgb on admit followed by hemodilution due to IV fluid resuscitation here.  -We will transfuse 1 unit. -We will give IV iron once infection under good control.  AKI on CKD-3 with mild azotemia: suspect some prerenal etiology.  Not on nephrotoxic meds.  Improved. -IV fluid as above -Continue monitoring. -Avoid nephrotoxic meds.  Mild hyperbilirubinemia: Appears chronic.  History of hemolytic anemia but hemoglobin normal.  Improved. -Recheck in the morning  Chronic lower extremity edema -Will hold home torsemide given soft blood pressures.  Essential hypertension//tachycardia: Improved. -Continue home Coreg at reduced dose.  Acute metabolic encephalopathy: Could  be due to sepsis from UTI.  Resolved. -Treat UTI as above.  Thrombocytopenia: Likely dilutional. -Monitor  DVT prophylaxis: SCD Code Status: DNR/DNI Family Communication: Patient and/or RN. Available if any question. Disposition Plan: Remains inpatient pending clinical improvement.  Consultants: None  Procedures:  None  Microbiology summarized: COVID-19 screen negative. Blood cultures  negative so far. Urine culture pending.  Antimicrobials: Anti-infectives (From admission, onward)   Start     Dose/Rate Route Frequency Ordered Stop   08/31/19 2100  cefTRIAXone (ROCEPHIN) 1 g in sodium chloride 0.9 % 100 mL IVPB  Status:  Discontinued     1 g 200 mL/hr over 30 Minutes Intravenous Every 24 hours 08/30/19 2329 08/31/19 1334   08/31/19 2100  cefTRIAXone (ROCEPHIN) 2 g in sodium chloride 0.9 % 100 mL IVPB     2 g 200 mL/hr over 30 Minutes Intravenous Every 24 hours 08/31/19 1334     08/30/19 2145  cefTRIAXone (ROCEPHIN) 1 g in sodium chloride 0.9 % 100 mL IVPB     1 g 200 mL/hr over 30 Minutes Intravenous  Once 08/30/19 2134 08/31/19 0016      Sch Meds:  Scheduled Meds: . carvedilol  6.25 mg Oral BID WC  . cholecalciferol  1,000 Units Oral Daily  . ferrous sulfate  325 mg Oral BID WC  . gabapentin  100 mg Oral TID  . multivitamin with minerals  1 tablet Oral Daily  . pantoprazole  40 mg Oral Daily  . vitamin C  500 mg Oral Daily  . zinc sulfate  220 mg Oral Daily   Continuous Infusions: . sodium chloride Stopped (08/31/19 0514)  . sodium chloride 100 mL/hr at 09/01/19 0950  . cefTRIAXone (ROCEPHIN)  IV 2 g (08/31/19 2106)   PRN Meds:.acetaminophen **OR** acetaminophen, ondansetron (ZOFRAN) IV   I have personally reviewed the following labs and images: CBC: Recent Labs  Lab 08/30/19 1903 08/31/19 0536 08/31/19 0750 08/31/19 1341 09/01/19 0537  WBC 6.3  --  4.9 6.1 5.5  NEUTROABS 4.9  --   --   --   --   HGB 6.9* 5.8* 7.3* 8.1* 7.2*  HCT 21.5* 18.0* 23.3* 26.5* 23.2*  MCV 94.3  --  96.7 96.7 94.7  PLT 174  --  90* 139* 147*   BMP &GFR Recent Labs  Lab 08/30/19 1903 08/31/19 0536 09/01/19 0537  NA 136 138 140  K 4.3 3.5 4.3  CL 106 110 110  CO2 21* 25 19*  GLUCOSE 138* 136* 124*  BUN 42* 36* 34*  CREATININE 1.62* 1.34* 1.35*  CALCIUM 8.4* 7.1* 7.9*  MG  --   --  2.3   CrCl cannot be calculated (Unknown ideal weight.). Liver &  Pancreas: Recent Labs  Lab 08/30/19 1903 09/01/19 0537  AST 22 24  ALT 16 19  ALKPHOS 62 51  BILITOT 2.9* 1.5*  PROT 6.6 5.3*  ALBUMIN 4.0 2.9*   No results for input(s): LIPASE, AMYLASE in the last 168 hours. No results for input(s): AMMONIA in the last 168 hours. Diabetic: No results for input(s): HGBA1C in the last 72 hours. No results for input(s): GLUCAP in the last 168 hours. Cardiac Enzymes: No results for input(s): CKTOTAL, CKMB, CKMBINDEX, TROPONINI in the last 168 hours. Recent Labs    08/04/19 1054  PROBNP 305.0*   Coagulation Profile: No results for input(s): INR, PROTIME in the last 168 hours. Thyroid Function Tests: Recent Labs    08/31/19 0536  TSH 0.797  FREET4 1.17*  Lipid Profile: No results for input(s): CHOL, HDL, LDLCALC, TRIG, CHOLHDL, LDLDIRECT in the last 72 hours. Anemia Panel: Recent Labs    08/30/19 2118  VITAMINB12 357  FOLATE >24.8  FERRITIN 534*  TIBC 179*  IRON 13*  RETICCTPCT 3.4*   Urine analysis:    Component Value Date/Time   COLORURINE AMBER (A) 08/30/2019 1916   APPEARANCEUR CLOUDY (A) 08/30/2019 1916   LABSPEC 1.014 08/30/2019 1916   PHURINE 6.0 08/30/2019 1916   GLUCOSEU NEGATIVE 08/30/2019 1916   GLUCOSEU NEGATIVE 06/08/2019 1109   HGBUR MODERATE (A) 08/30/2019 1916   HGBUR moderate 10/06/2009 1602   BILIRUBINUR NEGATIVE 08/30/2019 1916   BILIRUBINUR 1+ 03/16/2018 [place] 08/30/2019 1916   PROTEINUR 100 (A) 08/30/2019 1916   UROBILINOGEN 0.2 06/08/2019 1109   NITRITE NEGATIVE 08/30/2019 1916   LEUKOCYTESUR LARGE (A) 08/30/2019 1916   Sepsis Labs: Invalid input(s): PROCALCITONIN, [place]  Microbiology: Recent Results (from the past 240 hour(s))  Blood culture (routine x 2)     Status: None (Preliminary result)   Collection Time: 08/30/19  7:03 PM   Specimen: BLOOD  Result Value Ref Range Status   Specimen Description   Final    BLOOD RIGHT WRIST Performed at [facility] [address], [place], [place] [address]    Special Requests   Final    BOTTLES DRAWN AEROBIC AND ANAEROBIC Blood Culture adequate volume Performed at [facility] [address], [place], [place] [address]    Culture   Final    NO GROWTH 2 DAYS Performed at [facility] [address] [place], [place] [address]    Report Status PENDING  Incomplete  Blood culture (routine x 2)     Status: None (Preliminary result)   Collection Time: 08/30/19  7:07 PM   Specimen: BLOOD  Result Value Ref Range Status   Specimen Description   Final    BLOOD LEFT ANTECUBITAL Performed at [facility] [address], [place], [place] [address]    Special Requests   Final    BOTTLES DRAWN AEROBIC AND ANAEROBIC Blood Culture adequate volume Performed at [facility] [address], [place], [place] [address]    Culture   Final    NO GROWTH 2 DAYS Performed at [facility] [address] [place], [place] [address]    Report Status PENDING  Incomplete  SARS Coronavirus 2 [facility] order, Performed in [facility] hospital lab) Nasopharyngeal Nasopharyngeal Swab     Status: None   Collection Time: 08/30/19  9:00 PM   Specimen: Nasopharyngeal Swab  Result Value Ref Range Status   SARS Coronavirus 2 NEGATIVE NEGATIVE Final    Comment: (NOTE) If result is NEGATIVE SARS-CoV-2 target nucleic acids are NOT DETECTED. The SARS-CoV-2 RNA is generally detectable in upper and lower  respiratory specimens during the acute phase of infection. The lowest  concentration of SARS-CoV-2 viral copies this assay can detect is 250  copies / mL. A negative result does not preclude SARS-CoV-2 infection  and should not be used as the sole basis for treatment or other  patient management decisions.  A negative result may occur with  improper specimen collection / handling, submission of specimen other  than nasopharyngeal swab, presence  of viral mutation(s) within the  areas targeted by this assay, and inadequate number of viral copies  (<250 copies / mL). A negative result must be combined with clinical  observations, patient history, and epidemiological information. If result is POSITIVE SARS-CoV-2 target nucleic acids are DETECTED. The SARS-CoV-2 RNA is generally detectable in upper and lower  respiratory specimens dur ing the acute phase of infection.  Positive  results are indicative of active infection with SARS-CoV-2.  Clinical  correlation with patient history and other diagnostic information is  necessary to determine patient infection status.  Positive results do  not rule out bacterial infection or co-infection with other viruses. If result is PRESUMPTIVE POSTIVE SARS-CoV-2 nucleic acids MAY BE PRESENT.   A presumptive positive result was obtained on the submitted specimen  and confirmed on repeat testing.  While 2019 novel coronavirus  (SARS-CoV-2) nucleic acids may be present in the submitted sample  additional confirmatory testing may be necessary for epidemiological  and / or clinical management purposes  to differentiate between  SARS-CoV-2 and other Sarbecovirus currently known to infect humans.  If clinically indicated additional testing with an alternate test  methodology [phone]) is advised. The SARS-CoV-2 RNA is generally  detectable in upper and lower respiratory sp ecimens during the acute  phase of infection. The expected result is Negative. Fact Sheet for Patients:  StrictlyIdeas.no Fact Sheet for Healthcare Providers: BankingDealers.co.za This test is not yet approved or cleared by the [place] FDA and has been authorized for detection and/or diagnosis of SARS-CoV-2 by FDA under an Emergency Use Authorization (EUA).  This EUA will remain in effect (meaning this test can be used) for the duration of the COVID-19 declaration under Section  564(b)(1) of the Act, 21 U.S.C. section 360bbb-3(b)(1), unless the authorization is terminated or revoked sooner. Performed at [facility], [place] [address] [place], [place] [address]     Radiology Studies: Dg Chest Port 1 View  Result Date: 09/01/2019 CLINICAL DATA:  Hypoxia. Tachypnea. EXAM: PORTABLE CHEST 1 VIEW COMPARISON:  08/30/2019 FINDINGS: The heart size and pulmonary vascularity are normal. Minimal atelectasis at the lung bases. Aortic atherosclerosis. No acute bone abnormality. IMPRESSION: 1. Minimal bibasilar atelectasis. 2. Aortic atherosclerosis. 3. No other significant abnormalities. Electronically Signed   By: Lorriane Shire M.D.   On: 09/01/2019 07:42   Corbyn Wildey T. Anza  If 7PM-7AM, please contact night-coverage [email or web address] Password [email or web address] 09/01/2019, 2:02 PM

## 2019-09-01 NOTE — Significant Event (Signed)
Rapid Response Event Note  Overview: Time Called: 0630 Arrival Time: [id number] Event Type: Respiratory, Cardiac  Initial Focused Assessment: Patient lying in bed, has no complaints at this time. RR is 32 and oxygen saturation 88% on 2LNC. Patient is noted to be mouth breathing. HR 113. Patient had low grade fever of 99.72F, later rechecked and temperature was 100.47F. Lung sounds are clear.   Interventions: Placed on 8L Venturi mask. Tylenol given for fever of 100.47F. CXR ordered by Tylene Fantasia, NP.  Plan of Care (if not transferred): Per primary RN, patient presented on admission with similar symptoms and when her fever resolved, her respiratory rate and heart rate improved.  Patient does not appear to be in distress and states she has no complaints. RN to call rapid response for further needs and follow-up with provider in regards to results of CXR.    Event Summary:   Casimer Bilis

## 2019-09-01 NOTE — Progress Notes (Signed)
   09/01/19 0617  MEWS Score  Resp (!) 35  Pulse Rate (!) 112  BP (!) 125/54  Temp 98.9 F (37.2 C)  SpO2 96 %  O2 Device Simple Mask (mouth breathing )  O2 Flow Rate (L/min) 3 L/min  MEWS Score  MEWS RR 2  MEWS Pulse 2  MEWS Systolic 0  MEWS LOC 0  MEWS Temp 0  MEWS Score 4  MEWS Score Color Red

## 2019-09-02 ENCOUNTER — Ambulatory Visit: Payer: Medicare Other | Admitting: Physical Therapy

## 2019-09-02 LAB — TYPE AND SCREEN
ABO/RH(D): A POS
Antibody Screen: NEGATIVE
Unit division: 0
Unit division: 0

## 2019-09-02 LAB — BPAM RBC
Blood Product Expiration Date: 202010212359
Blood Product Expiration Date: 202010262359
ISSUE DATE / TIME: 202010060558
ISSUE DATE / TIME: 202010071650
Unit Type and Rh: 6200
Unit Type and Rh: 6200

## 2019-09-02 LAB — BASIC METABOLIC PANEL
Anion gap: 9 (ref 5–15)
BUN: 32 mg/dL — ABNORMAL HIGH (ref 8–23)
CO2: 19 mmol/L — ABNORMAL LOW (ref 22–32)
Calcium: 7.9 mg/dL — ABNORMAL LOW (ref 8.9–10.3)
Chloride: 111 mmol/L (ref 98–111)
Creatinine, Ser: 1.33 mg/dL — ABNORMAL HIGH (ref 0.44–1.00)
GFR calc Af Amer: 42 mL/min — ABNORMAL LOW (ref >60)
GFR calc non Af Amer: 36 mL/min — ABNORMAL LOW (ref >60)
Glucose, Bld: 109 mg/dL — ABNORMAL HIGH (ref 70–99)
Potassium: 4.4 mmol/L (ref 3.5–5.1)
Sodium: 139 mmol/L (ref 135–145)

## 2019-09-02 LAB — HEMOGLOBIN AND HEMATOCRIT, BLOOD
HCT: 26.4 % — ABNORMAL LOW (ref 36.0–46.0)
Hemoglobin: 8.4 g/dL — ABNORMAL LOW (ref 12.0–15.0)

## 2019-09-02 LAB — CBC
HCT: 26.2 % — ABNORMAL LOW (ref 36.0–46.0)
Hemoglobin: 8.2 g/dL — ABNORMAL LOW (ref 12.0–15.0)
MCH: 29.1 pg (ref 26.0–34.0)
MCHC: 31.3 g/dL (ref 30.0–36.0)
MCV: 92.9 fL (ref 80.0–100.0)
Platelets: 163 10*3/uL (ref 150–400)
RBC: 2.82 MIL/uL — ABNORMAL LOW (ref 3.87–5.11)
RDW: 18.6 % — ABNORMAL HIGH (ref 11.5–15.5)
WBC: 5.8 10*3/uL (ref 4.0–10.5)
nRBC: 0 % (ref 0.0–0.2)

## 2019-09-02 LAB — URINE CULTURE: Culture: <10000 — AB

## 2019-09-02 LAB — MAGNESIUM: Magnesium: 2.3 mg/dL (ref 1.7–2.4)

## 2019-09-02 NOTE — Progress Notes (Signed)
PROGRESS NOTE  Claudia Lara [id number] DOB: [birth year]   PCP: Janith Lima, MD  Patient is from: Home  DOA: 08/30/2019 LOS: 2  Brief Narrative / Interim history: 85 y.o. female with medical history significant of anemia, carotid artery stenosis, GERD, hemorrhoids, hypertension, PVD presenting to the hospital for evaluation of confusion.  Admitted with sepsis due to UTI, anemia and AKI.  In ED, AAO x4.  Febrile to 102.2.  Hgb 6.9 (from 9.2 on 9/23).  Lactic acid normal.  COVID-19 negative.  Creatinine 1.6 (baseline 1.1-1.3).  BUN 42.  Total bili 2.9.  LFT normal.  Lactic acid negative.  Urinalysis concerning for UTI.  FOBT negative.  CXR revealed mild blunting of left costophrenic angle suspicious for small pleural effusion.  Started on IV ceftriaxone.  One unit of pRBC ordered.  Admitted for sepsis due to UTI, anemia and AKI.  Subjective: No major events overnight of this morning.  Mild fever to 100.5 at 2000.  Reports sense of bladder fullness this morning.  Denies chest pain, dyspnea, cough, nausea, vomiting or abdominal pain.  Objective: Vitals:   09/02/19 0115 09/02/19 0523 09/02/19 0700 09/02/19 1452  BP:  129/63  134/76  Pulse:  78  79  Resp:  18  (!) 22  Temp: (!) 97.5 F (36.4 C) 97.9 F (36.6 C)  98 F (36.7 C)  TempSrc: Oral Oral  Oral  SpO2:  97%  98%  Weight:   78 kg   Height:   5' 2.99" (1.6 m)     Intake/Output Summary (Last 24 hours) at 09/02/2019 1550 Last data filed at 09/02/2019 1000 Gross per 24 hour  Intake 2545.94 ml  Output 300 ml  Net 2245.94 ml   Filed Weights   09/02/19 0700  Weight: 78 kg    Examination:  GENERAL: No acute distress.  Appears well.  HEENT: MMM.  Vision and hearing grossly intact.  NECK: Supple.  No apparent JVD.  RESP:  No IWOB. Good air movement bilaterally. CVS:  RRR. Heart sounds normal.  ABD/GI/GU: Bowel sounds present. Soft.  Mild suprapubic tenderness. MSK/EXT:  Moves extremities. No apparent deformity or  edema.  SKIN: no apparent skin lesion or wound NEURO: Awake, alert and oriented appropriately.  No gross deficit.  PSYCH: Calm. Normal affect.   Assessment & Plan: Sepsis likely due to UTI: still with tachypnea and tachycardia.  Lactic acid normal. No history of ESBL or MDR organisms on previous cultures.  Blood cultures negative so far.  -Discontinue IV fluid. -Continue IV ceftriaxone pending urine culture. -We will send urine culture-can help excluding ESBL.  -PT/OT eval.  Iron deficiency anemia: FOBT is negative.  History of hemolytic anemia but haptoglobin normal.  Iron sat 7%.   -Hgb 6.9 (admit)>1u>5.8> 3u>8.2 -Suspect hemoconcentration and falsely elevated Hgb on admit followed by hemodilution due to IVF. -We will discharge on p.o. ferrous sulfate.  AKI on CKD-3 with mild azotemia: suspect some prerenal etiology.  Not on nephrotoxic meds.  Improved. -Continue monitoring. -Avoid nephrotoxic meds.  Mild hyperbilirubinemia: Appears chronic.  History of hemolytic anemia but hemoglobin normal.  Improved. -Recheck in the morning  Chronic lower extremity edema -Will hold home torsemide given soft blood pressures.  Essential hypertension//tachycardia: Improved. -Continue home Coreg at reduced dose.  Acute metabolic encephalopathy: Could be due to sepsis from UTI.  Resolved. -Treat UTI as above.  Thrombocytopenia: Likely dilutional.  Resolved. -Monitor  DVT prophylaxis: SCD Code Status: DNR/DNI Family Communication: Patient and/or RN. Available if any question.  Disposition Plan: Discharge on 10/9 if remains stable. Consultants: None  Procedures:  None  Microbiology summarized: COVID-19 screen negative. Blood cultures negative so far. Urine culture pending.  Antimicrobials: Anti-infectives (From admission, onward)   Start     Dose/Rate Route Frequency Ordered Stop   08/31/19 2100  cefTRIAXone (ROCEPHIN) 1 g in sodium chloride 0.9 % 100 mL IVPB  Status:  Discontinued      1 g 200 mL/hr over 30 Minutes Intravenous Every 24 hours 08/30/19 2329 08/31/19 1334   08/31/19 2100  cefTRIAXone (ROCEPHIN) 2 g in sodium chloride 0.9 % 100 mL IVPB     2 g 200 mL/hr over 30 Minutes Intravenous Every 24 hours 08/31/19 1334     08/30/19 2145  cefTRIAXone (ROCEPHIN) 1 g in sodium chloride 0.9 % 100 mL IVPB     1 g 200 mL/hr over 30 Minutes Intravenous  Once 08/30/19 2134 08/31/19 0016      Sch Meds:  Scheduled Meds: . carvedilol  3.125 mg Oral BID WC  . cholecalciferol  1,000 Units Oral Daily  . ferrous sulfate  325 mg Oral BID WC  . gabapentin  100 mg Oral TID  . multivitamin with minerals  1 tablet Oral Daily  . pantoprazole  40 mg Oral Daily  . vitamin C  500 mg Oral Daily  . zinc sulfate  220 mg Oral Daily   Continuous Infusions: . sodium chloride 50 mL/hr at 09/02/19 0145  . cefTRIAXone (ROCEPHIN)  IV Stopped (09/01/19 2315)   PRN Meds:.acetaminophen **OR** acetaminophen, ondansetron (ZOFRAN) IV   I have personally reviewed the following labs and images: CBC: Recent Labs  Lab 08/30/19 1903  08/31/19 0750 08/31/19 1341 09/01/19 0537 09/01/19 2340 09/02/19 0708  WBC 6.3  --  4.9 6.1 5.5  --  5.8  NEUTROABS 4.9  --   --   --   --   --   --   HGB 6.9*   < > 7.3* 8.1* 7.2* 8.4* 8.2*  HCT 21.5*   < > 23.3* 26.5* 23.2* 26.4* 26.2*  MCV 94.3  --  96.7 96.7 94.7  --  92.9  PLT 174  --  90* 139* 147*  --  163   < > = values in this interval not displayed.   BMP &GFR Recent Labs  Lab 08/30/19 1903 08/31/19 0536 09/01/19 0537 09/02/19 0537  NA 136 138 140 139  K 4.3 3.5 4.3 4.4  CL 106 110 110 111  CO2 21* 25 19* 19*  GLUCOSE 138* 136* 124* 109*  BUN 42* 36* 34* 32*  CREATININE 1.62* 1.34* 1.35* 1.33*  CALCIUM 8.4* 7.1* 7.9* 7.9*  MG  --   --  2.3 2.3   Estimated Creatinine Clearance: 30.6 mL/min (A) (by C-G formula based on SCr of 1.33 mg/dL (H)). Liver & Pancreas: Recent Labs  Lab 08/30/19 1903 09/01/19 0537  AST 22 24  ALT 16 19   ALKPHOS 62 51  BILITOT 2.9* 1.5*  PROT 6.6 5.3*  ALBUMIN 4.0 2.9*   No results for input(s): LIPASE, AMYLASE in the last 168 hours. No results for input(s): AMMONIA in the last 168 hours. Diabetic: No results for input(s): HGBA1C in the last 72 hours. No results for input(s): GLUCAP in the last 168 hours. Cardiac Enzymes: No results for input(s): CKTOTAL, CKMB, CKMBINDEX, TROPONINI in the last 168 hours. Recent Labs    08/04/19 1054  PROBNP 305.0*   Coagulation Profile: No results for input(s): INR, PROTIME in  the last 168 hours. Thyroid Function Tests: Recent Labs    08/31/19 0536  TSH 0.797  FREET4 1.17*   Lipid Profile: No results for input(s): CHOL, HDL, LDLCALC, TRIG, CHOLHDL, LDLDIRECT in the last 72 hours. Anemia Panel: Recent Labs    08/30/19 2118  VITAMINB12 357  FOLATE >24.8  FERRITIN 534*  TIBC 179*  IRON 13*  RETICCTPCT 3.4*   Urine analysis:    Component Value Date/Time   COLORURINE AMBER (A) 08/30/2019 1916   APPEARANCEUR CLOUDY (A) 08/30/2019 1916   LABSPEC 1.014 08/30/2019 1916   PHURINE 6.0 08/30/2019 1916   GLUCOSEU NEGATIVE 08/30/2019 1916   GLUCOSEU NEGATIVE 06/08/2019 1109   HGBUR MODERATE (A) 08/30/2019 1916   HGBUR moderate 10/06/2009 1602   BILIRUBINUR NEGATIVE 08/30/2019 1916   BILIRUBINUR 1+ 03/16/2018 [place] 08/30/2019 1916   PROTEINUR 100 (A) 08/30/2019 1916   UROBILINOGEN 0.2 06/08/2019 1109   NITRITE NEGATIVE 08/30/2019 1916   LEUKOCYTESUR LARGE (A) 08/30/2019 1916   Sepsis Labs: Invalid input(s): PROCALCITONIN, [place]  Microbiology: Recent Results (from the past 240 hour(s))  Blood culture (routine x 2)     Status: None (Preliminary result)   Collection Time: 08/30/19  7:03 PM   Specimen: BLOOD  Result Value Ref Range Status   Specimen Description   Final    BLOOD RIGHT WRIST Performed at [place] [address] [place], [place] [address]    Special Requests   Final     BOTTLES DRAWN AEROBIC AND ANAEROBIC Blood Culture adequate volume Performed at [place] [address], [place], [place] [address]    Culture   Final    NO GROWTH 3 DAYS Performed at [facility], [place] [address], [place], [place] [address]    Report Status PENDING  Incomplete  Blood culture (routine x 2)     Status: None (Preliminary result)   Collection Time: 08/30/19  7:07 PM   Specimen: BLOOD  Result Value Ref Range Status   Specimen Description   Final    BLOOD LEFT ANTECUBITAL Performed at [facility] [address], [place], [place] [address]    Special Requests   Final    BOTTLES DRAWN AEROBIC AND ANAEROBIC Blood Culture adequate volume Performed at [facility] [address] [place], [place] [address]    Culture   Final    NO GROWTH 3 DAYS Performed at [facility], [place] [address] [place], [place] [address]    Report Status PENDING  Incomplete  SARS Coronavirus 2 [facility] order, Performed in [facility] hospital lab) Nasopharyngeal Nasopharyngeal Swab     Status: None   Collection Time: 08/30/19  9:00 PM   Specimen: Nasopharyngeal Swab  Result Value Ref Range Status   SARS Coronavirus 2 NEGATIVE NEGATIVE Final    Comment: (NOTE) If result is NEGATIVE SARS-CoV-2 target nucleic acids are NOT DETECTED. The SARS-CoV-2 RNA is generally detectable in upper and lower  respiratory specimens during the acute phase of infection. The lowest  concentration of SARS-CoV-2 viral copies this assay can detect is 250  copies / mL. A negative result does not preclude SARS-CoV-2 infection  and should not be used as the sole basis for treatment or other  patient management decisions.  A negative result may occur with  improper specimen collection / handling, submission of specimen other  than nasopharyngeal swab, presence of viral mutation(s) within the  areas targeted by this  assay, and inadequate number  of viral copies  (<250 copies / mL). A negative result must be combined with clinical  observations, patient history, and epidemiological information. If result is POSITIVE SARS-CoV-2 target nucleic acids are DETECTED. The SARS-CoV-2 RNA is generally detectable in upper and lower  respiratory specimens dur ing the acute phase of infection.  Positive  results are indicative of active infection with SARS-CoV-2.  Clinical  correlation with patient history and other diagnostic information is  necessary to determine patient infection status.  Positive results do  not rule out bacterial infection or co-infection with other viruses. If result is PRESUMPTIVE POSTIVE SARS-CoV-2 nucleic acids MAY BE PRESENT.   A presumptive positive result was obtained on the submitted specimen  and confirmed on repeat testing.  While 2019 novel coronavirus  (SARS-CoV-2) nucleic acids may be present in the submitted sample  additional confirmatory testing may be necessary for epidemiological  and / or clinical management purposes  to differentiate between  SARS-CoV-2 and other Sarbecovirus currently known to infect humans.  If clinically indicated additional testing with an alternate test  methodology [phone]) is advised. The SARS-CoV-2 RNA is generally  detectable in upper and lower respiratory sp ecimens during the acute  phase of infection. The expected result is Negative. Fact Sheet for Patients:  StrictlyIdeas.no Fact Sheet for Healthcare Providers: BankingDealers.co.za This test is not yet approved or cleared by the [place] FDA and has been authorized for detection and/or diagnosis of SARS-CoV-2 by FDA under an Emergency Use Authorization (EUA).  This EUA will remain in effect (meaning this test can be used) for the duration of the COVID-19 declaration under Section 564(b)(1) of the Act, 21 U.S.C. section 360bbb-3(b)(1), unless the authorization is  terminated or revoked sooner. Performed at [facility], [place] [address] [place], [place] [address]   Culture, Urine     Status: Abnormal   Collection Time: 09/01/19  7:46 AM   Specimen: Urine, Clean Catch  Result Value Ref Range Status   Specimen Description   Final    URINE, CLEAN CATCH Performed at [facility], [place] [address] [place], [place] [address]    Special Requests   Final    NONE Performed at [facility], [place] [address] [place], [place] [address]    Culture (A)  Final    <10,000 COLONIES/mL INSIGNIFICANT GROWTH Performed at [place] [facility] [address], [place], [place] [address]    Report Status 09/02/2019 FINAL  Final    Radiology Studies: No results found. Robbert Langlinais T. [place]  If 7PM-7AM, please contact night-coverage [email or web address] Password [email or web address] 09/02/2019, 3:50 PM

## 2019-09-02 NOTE — Evaluation (Signed)
Occupational Therapy Evaluation Patient Details Name: Claudia Lara MRN: [id number] DOB: 10/24/1932 Today's Date: 09/02/2019    History of Present Illness Pt is an 85 yo female admitted with anemia, UTI w sepsis, confusion, and fever.  Pt had blood transfusion and did not get out of bed for 3 days until this eval.  pt with PMH of anemia, B TKAS, THR.    Clinical Impression   Pt admitted with the above diagnosis and has the deficits listed below.  Pt would benefit from cont OT to increase independence and efficiency with basic adls so she can d/c home alone. Pt has a daughter that checks in on her and is leaving for vacation, per pt.  Ideally pt would benefit from 24/7 S just for a couple of days due to spending 3 days in bed and becoming weak.  Rec HHOT at d/c since pt lives alone and pt states friends will check in on her.      Follow Up Recommendations  Home health OT;Supervision - Intermittent(ideally 24 hour S for 48 hours)    Equipment Recommendations  None recommended by OT    Recommendations for Other Services       Precautions / Restrictions Precautions Precautions: Fall Restrictions Weight Bearing Restrictions: No Other Position/Activity Restrictions: had TKR 5 weeks ago      Mobility Bed Mobility Overal bed mobility: Needs Assistance Bed Mobility: Supine to Sit     Supine to sit: Min guard;HOB elevated     General bed mobility comments: Pt moved well in bed.  Pt has flat bed at home. Needs to practice getting up from fully supine. This was first time up since admission.  Transfers Overall transfer level: Needs assistance Equipment used: Rolling walker (2 wheeled) Transfers: Sit to/from [facility] Sit to Stand: Min assist Stand pivot transfers: Min assist       General transfer comment: Used cane on and walker.  Feels better with walker for now.    Balance Overall balance assessment: Needs assistance Sitting-balance support: Feet  supported Sitting balance-Leahy Scale: Good     Standing balance support: Bilateral upper extremity supported;During functional activity Standing balance-Leahy Scale: Poor Standing balance comment: Needs walker for support.                             ADL either performed or assessed with clinical judgement   ADL Overall ADL's : Needs assistance/impaired Eating/Feeding: Independent;Sitting   Grooming: Wash/dry hands;Wash/dry face;Brushing hair;Supervision/safety;Standing   Upper Body Bathing: Set up;Sitting   Lower Body Bathing: Minimal assistance;Sit to/from stand   Upper Body Dressing : Set up;Sitting   Lower Body Dressing: Min guard;Sit to/from stand;With adaptive equipment Lower Body Dressing Details (indicate cue type and reason): pt uses sock aid Toilet Transfer: Minimal assistance;Ambulation Toilet Transfer Details (indicate cue type and reason): Pt walked to bathroom with cane and min assist.  Did well for first time up in 3 days. Toileting- Clothing Manipulation and Hygiene: Supervision/safety;Sitting/lateral lean       Functional mobility during ADLs: Minimal assistance;Rolling walker General ADL Comments: Pt did well with adls considering she has been in bed for 3 days.  Daughter checks on pt at home but is leaving for vacation soon.  Will have friends check in on her after she returns home.     Vision Baseline Vision/History: Wears glasses Wears Glasses: Reading only Patient Visual Report: No change from baseline Vision Assessment?: No apparent visual deficits  Perception Perception Perception Tested?: No   Praxis Praxis Praxis tested?: Within functional limits    Pertinent Vitals/Pain Pain Assessment: No/denies pain     Hand Dominance Right   Extremity/Trunk Assessment Upper Extremity Assessment Upper Extremity Assessment: Overall WFL for tasks assessed   Lower Extremity Assessment Lower Extremity Assessment: Defer to PT evaluation    Cervical / Trunk Assessment Cervical / Trunk Assessment: Normal   Communication Communication Communication: No difficulties;HOH   Cognition Arousal/Alertness: Awake/alert Behavior During Therapy: WFL for tasks assessed/performed Overall Cognitive Status: Within Functional Limits for tasks assessed                                     General Comments  Pt overall doing well.  Unfortunate she has in bed the last 3 days. Pt appears weak from that but should recover well.    Exercises     Shoulder Instructions      Home Living Family/patient expects to be discharged to:: Private residence Living Arrangements: Alone Available Help at Discharge: Friend(s);Available PRN/intermittently Type of Home: House Home Access: Stairs to enter [employer] of Steps: 2 Entrance Stairs-Rails: Can reach both Home Layout: One level     Bathroom Shower/Tub: Tub/shower unit;Curtain   Bathroom Toilet: Handicapped height     Home Equipment: Environmental consultant - 2 wheels;Bedside commode;Shower seat;Cane - single point   Additional Comments: Pt had progressed to using cane but now back to using walker bc of weakness from being in bed.      Prior Functioning/Environment Level of Independence: Independent with assistive device(s)        Comments: Pt has transitioned to cane since surgery.        OT Problem List: Decreased activity tolerance;Impaired balance (sitting and/or standing);Decreased knowledge of use of DME or AE      OT Treatment/Interventions:      OT Goals(Current goals can be found in the care plan section) Acute Rehab OT Goals Patient Stated Goal: to go home when I feel stronger OT Goal Formulation: With patient Time For Goal Achievement: 09/16/19 Potential to Achieve Goals: Good ADL Goals Pt Will Perform Grooming: with modified independence;standing Pt Will Perform Lower Body Dressing: with modified independence;with adaptive equipment;sit to/from  stand Pt Will Perform Tub/Shower Transfer: Tub transfer;with min guard assist;rolling walker;ambulating;shower seat Additional ADL Goal #1: Pt will walk to bathroom with walker and do all toileting tasks on 3:1 over commode with  mod I.  OT Frequency: Min 2X/week   Barriers to D/C: Decreased caregiver support  Pt lives alone but can have friends check in.  Daughter leaving for vacation.       Co-evaluation              AM-PAC OT "6 Clicks" Daily Activity     Outcome Measure Help from another person eating meals?: None Help from another person taking care of personal grooming?: None Help from another person toileting, which includes using toliet, bedpan, or urinal?: None Help from another person bathing (including washing, rinsing, drying)?: A Little Help from another person to put on and taking off regular upper body clothing?: None Help from another person to put on and taking off regular lower body clothing?: A Little 6 Click Score: 22   End of Session Equipment Utilized During Treatment: Rolling walker;Gait belt Nurse Communication: Mobility status  Activity Tolerance: Patient limited by fatigue Patient left: in chair;with call bell/phone within reach;with chair  alarm set  OT Visit Diagnosis: Other abnormalities of gait and mobility (R26.89)                Time: [id number] OT Time Calculation (min): 34 min Charges:  OT General Charges $OT Visit: 1 Visit OT Evaluation $OT Eval Moderate Complexity: 1 Mod   Glenford Peers 09/02/2019, 12:44 PM

## 2019-09-02 NOTE — Evaluation (Signed)
Physical Therapy Evaluation Patient Details Name: Claudia Lara MRN: [id number] DOB: 08-26-1932 Today's Date: 09/02/2019   History of Present Illness  Pt is an 85 yo female admitted with anemia, UTI w sepsis, confusion, and fever.  Pt had blood transfusion and did not get out of bed for 3 days until this eval.  pt with PMH of anemia, B TKAS, THR.     Clinical Impression  Claudia Lara is 85 y.o. female admitted with above HPI and diagnosis. She is currently limited by functional impairments below (see PT problem list). Patient lives alone and is independent at baseline with SPC. Her daughter lives close and is typically available intermittently however is leaving for vacation. Patient would benefit from 24/7 supervision when initially returning home due to weakness and impaired balance and would likely be able to transition to intermittent supervision as mobility improves. Patient reporting she has several friends who can check in on her periodically if she returns home. She will benefit from continued skilled PT interventions to address impairments and progress independence with mobility, recommending HHPT. Acute PT will follow and progress as able.    Follow Up Recommendations Home health PT;Supervision - Intermittent    Equipment Recommendations  None recommended by PT    Recommendations for Other Services       Precautions / Restrictions Precautions Precautions: Fall Restrictions Weight Bearing Restrictions: No Other Position/Activity Restrictions: had TKR 5 weeks ago      Mobility  Bed Mobility Overal bed mobility: Needs Assistance Bed Mobility: Supine to Sit     Supine to sit: Min guard;HOB elevated     General bed mobility comments: Pt moved well in bed.  Pt has flat bed at home. Needs to practice getting up from fully supine. This was first time up since admission.  Transfers Overall transfer level: Needs assistance Equipment used: Rolling walker (2  wheeled);Straight cane Transfers: Sit to/from [facility] Sit to Stand: Min assist Stand pivot transfers: Min assist       General transfer comment: Used cane and walker.  Pt is steadier with RW for suppport. No cues required for safe hand placement and pt able to initiate power up from EOB adn toilet with physical assist, min assist provided to steady upon rising.  Ambulation/Gait Ambulation/Gait assistance: Min assist Gait Distance (Feet): 80 Feet Assistive device: Rolling walker (2 wheeled);Straight cane Gait Pattern/deviations: Step-through pattern;Decreased stride length Gait velocity: decreased   General Gait Details: pt unsteady with SPC to amb to bathroom, requiring min assist to prevent LOB. Stability improved with bil UE support using RW, cues intermittenyl to keep walker on ground for safety when trying to negotiate obstacles in hallway. Patient SpO2 fluctuating as low as 80% with poor wavelength, pt never symptomatic and as she ambulated on RA SpO2 increased to 98%.  Stairs            Wheelchair Mobility    Modified Rankin (Stroke Patients Only)       Balance Overall balance assessment: Needs assistance Sitting-balance support: Feet supported;No upper extremity supported Sitting balance-Leahy Scale: Good     Standing balance support: Bilateral upper extremity supported;During functional activity;Single extremity supported Standing balance-Leahy Scale: Fair Standing balance comment: Needs walker for support with dynamic standing and gait.                  Pertinent Vitals/Pain Pain Assessment: No/denies pain    Home Living Family/patient expects to be discharged to:: Private residence Living Arrangements: Alone Available  Help at Discharge: Friend(s);Available PRN/intermittently Type of Home: House Home Access: Stairs to enter Entrance Stairs-Rails: Can reach both Entrance Stairs-Number of Steps: 2 Home Layout: One level Home  Equipment: Walker - 2 wheels;Bedside commode;Shower seat;Cane - single point Additional Comments: Pt had progressed to using cane but now back to using walker bc of weakness from being in bed.    Prior Function Level of Independence: Independent with assistive device(s)         Comments: Pt has transitioned to cane since surgery.     Hand Dominance   Dominant Hand: Right    Extremity/Trunk Assessment   Upper Extremity Assessment Upper Extremity Assessment: Defer to OT evaluation    Lower Extremity Assessment Lower Extremity Assessment: Generalized weakness    Cervical / Trunk Assessment Cervical / Trunk Assessment: Normal  Communication   Communication: No difficulties;HOH  Cognition Arousal/Alertness: Awake/alert Behavior During Therapy: WFL for tasks assessed/performed Overall Cognitive Status: Within Functional Limits for tasks assessed                 General Comments General comments (skin integrity, edema, etc.): Pt overall doing well.  Unfortunate she has in bed the last 3 days. Pt appears weak from that but should recover well.    Exercises     Assessment/Plan    PT Assessment Patient needs continued PT services  PT Problem List Decreased strength;Decreased balance;Decreased mobility;Decreased activity tolerance       PT Treatment Interventions DME instruction;Functional mobility training;Balance training;Patient/family education;Gait training;Therapeutic activities;Stair training;Therapeutic exercise    PT Goals (Current goals can be found in the Care Plan section)  Acute Rehab PT Goals Patient Stated Goal: to go home when I feel stronger PT Goal Formulation: With patient Time For Goal Achievement: 09/16/19 Potential to Achieve Goals: Good    Frequency Min 3X/week    AM-PAC PT "6 Clicks" Mobility  Outcome Measure Help needed turning from your back to your side while in a flat bed without using bedrails?: A Little Help needed moving from  lying on your back to sitting on the side of a flat bed without using bedrails?: A Little Help needed moving to and from a bed to a chair (including a wheelchair)?: A Little Help needed standing up from a chair using your arms (e.g., wheelchair or bedside chair)?: A Little Help needed to walk in hospital room?: A Little Help needed climbing 3-5 steps with a railing? : A Little 6 Click Score: 18    End of Session Equipment Utilized During Treatment: Gait belt Activity Tolerance: Patient tolerated treatment well Patient left: in chair;with call bell/phone within reach;with chair alarm set Nurse Communication: Mobility status PT Visit Diagnosis: Muscle weakness (generalized) (M62.81);Difficulty in walking, not elsewhere classified (R26.2)    Time: 1202-1230 PT Time Calculation (min) (ACUTE ONLY): 28 min   Charges:   PT Evaluation $PT Eval Moderate Complexity: 1 Mod         Kipp Brood, PT, DPT, [facility] Physical Therapist with [facility]  09/02/2019 1:33 PM

## 2019-09-02 NOTE — Progress Notes (Signed)
Pt rounding & noted pt to be diaphoretic, bed linens & gown soaked, pt snores loudly & mouth breaths, upper airway congestion heard, clears with coughing & deep breathing

## 2019-09-03 LAB — BASIC METABOLIC PANEL
Anion gap: 11 (ref 5–15)
BUN: 31 mg/dL — ABNORMAL HIGH (ref 8–23)
CO2: 19 mmol/L — ABNORMAL LOW (ref 22–32)
Calcium: 8.4 mg/dL — ABNORMAL LOW (ref 8.9–10.3)
Chloride: 110 mmol/L (ref 98–111)
Creatinine, Ser: 1.21 mg/dL — ABNORMAL HIGH (ref 0.44–1.00)
GFR calc Af Amer: 47 mL/min — ABNORMAL LOW (ref >60)
GFR calc non Af Amer: 41 mL/min — ABNORMAL LOW (ref >60)
Glucose, Bld: 114 mg/dL — ABNORMAL HIGH (ref 70–99)
Potassium: 3.9 mmol/L (ref 3.5–5.1)
Sodium: 140 mmol/L (ref 135–145)

## 2019-09-03 LAB — HEMOGLOBIN AND HEMATOCRIT, BLOOD
HCT: 26.7 % — ABNORMAL LOW (ref 36.0–46.0)
Hemoglobin: 8.4 g/dL — ABNORMAL LOW (ref 12.0–15.0)

## 2019-09-03 MED ORDER — DOCUSATE SODIUM 100 MG PO CAPS: 100.0000 mg | ORAL_CAPSULE | Freq: Every day | ORAL | 2 refills | Status: DC | PRN

## 2019-09-03 MED ORDER — CEPHALEXIN 250 MG PO CAPS: 250.0000 mg | ORAL_CAPSULE | Freq: Four times a day (QID) | ORAL | 0 refills | Status: AC

## 2019-09-03 NOTE — Discharge Summary (Signed)
Physician Discharge Summary  ELWYN IVERY [id number] DOB: 02/13/1932 DOA: 08/30/2019  PCP: Janith Lima, MD  Admit date: 08/30/2019 Discharge date: 09/03/2019  Admitted From: Home Disposition: Home  Recommendations for Outpatient Follow-up:  1. Follow up with PCP in 1-2 weeks 2. Please obtain CBC/BMP/Mag at follow up 3. Please follow up on the following pending results: None  Home Health: PT/OT Equipment/Devices: None  Discharge Condition: Stable CODE STATUS: Full code  Hospital Course: 85 y.o.femalewith medical history significant ofanemia, carotid artery stenosis, GERD, hemorrhoids, hypertension, PVD presenting to the hospital for evaluation of confusion.  Admitted with sepsis due to UTI, anemia and AKI.  In ED, AAO x4.  Febrile to 102.2.  Hgb 6.9 (from 9.2 on 9/23).  Lactic acid normal.  COVID-19 negative.  Creatinine 1.6 (baseline 1.1-1.3).  BUN 42.  Total bili 2.9.  LFT normal.  Lactic acid negative.  Urinalysis concerning for UTI.  FOBT negative.  CXR revealed mild blunting of left costophrenic angle suspicious for small pleural effusion.  Started on IV ceftriaxone.  One unit of pRBC ordered.  Admitted for sepsis due to UTI, anemia and AKI.  See individual problem list below for more on hospital course.  Discharge Diagnoses:  Sepsis likely due to UTI: Sepsis physiology resolved.  Symptoms resolved.  Urine culture without significant growth but obtained after IV antibiotics.  No history of ESBL or MDR organisms on previous cultures.  Blood cultures negative so far.  -IV ceftriaxone 10/5-10/9.  Discharged on Keflex for 3 more days.  Iron deficiency anemia: FOBT is negative.  History of hemolytic anemia but haptoglobin normal.  Iron sat 7%.   -Hgb 6.9 (admit)>1u>5.8> 3u>8.2> 8.4 -Suspect hemoconcentration and falsely elevated Hgb on admit followed by hemodilution due to IVF. -Discharged on ferrous sulfate and PRN Colace.  AKI on CKD-3 with mild azotemia: suspect  some prerenal etiology.  Was also on high-dose aspirin for DVT prophylaxis after knee replacement.  AKI resolved with hydration. -Off aspirin now -Recheck BMP at follow-up.  Mild hyperbilirubinemia: Appears chronic.  History of hemolytic anemia but hemoglobin normal but haptoglobin and H&H stable. -Recheck CMP at follow-up.  Chronic lower extremity edema -Discharged on home torsemide.  Essential hypertension//tachycardia: Resolved. -Discharged on home Coreg.  Acute metabolic encephalopathy: Could be due to sepsis from UTI.  Resolved.  Thrombocytopenia: Likely dilutional.  Resolved. -Monitor  Patient was evaluated by PT/OT and discharge home with home health PT/OT.  Discharge Instructions  Discharge Instructions    Call MD for:  persistant dizziness or light-headedness   Complete by: As directed    Call MD for:  persistant nausea and vomiting   Complete by: As directed    Call MD for:  severe uncontrolled pain   Complete by: As directed    Call MD for:  temperature >100.4   Complete by: As directed    Diet - low sodium heart healthy   Complete by: As directed    Discharge instructions   Complete by: As directed    It has been a pleasure taking care of you! You were hospitalized with anemia (low blood level) and urinary tract infection.  You were given blood transfusion for anemia.  You were treated with antibiotics for urinary tract infection.  We are discharging you more antibiotics to continue treatment course for urinary tract infection.  It is very important that you take the medication until you complete the whole course. Please review your new medication list and the directions before you take your  medications. Please call your primary care office as soon as possible to schedule hospital follow-up visit in 1 to 2 weeks.  Take care,   Increase activity slowly   Complete by: As directed      Allergies as of 09/03/2019      Reactions   Sulfonamide Derivatives Rash    Childhood reaction      Medication List    STOP taking these medications   aspirin 325 MG EC tablet   oxyCODONE 5 MG immediate release tablet Commonly known as: Oxy IR/ROXICODONE     TAKE these medications   CALCIUM CARBONATE-VITAMIN D3 PO Take 1 tablet by mouth daily.   carvedilol 12.5 MG tablet Commonly known as: COREG TAKE 1 TABLET (12.5 MG TOTAL) BY MOUTH 2 (TWO) TIMES DAILY WITH A MEAL.   cephALEXin 250 MG capsule Commonly known as: KEFLEX Take 1 capsule (250 mg total) by mouth 4 (four) times daily for 3 days.   cholecalciferol 1000 units tablet Commonly known as: VITAMIN D Take 1,000 Units by mouth daily.   ferrous sulfate 325 (65 FE) MG tablet Take 1 tablet (325 mg total) by mouth 2 (two) times daily with a meal.   gabapentin 100 MG capsule Commonly known as: NEURONTIN Take 1 capsule (100 mg total) by mouth 3 (three) times daily. 1 tab 3x/day for 2 weeks. 1 tab 2x/day for 2 weeks. 1 tab a day for 2 weeks   multivitamin with minerals Tabs tablet Take 1 tablet by mouth daily.   pantoprazole 40 MG tablet Commonly known as: PROTONIX Take 1 tablet (40 mg total) by mouth daily.   torsemide 20 MG tablet Commonly known as: Demadex Take 1 tablet (20 mg total) by mouth daily.   traMADol 50 MG tablet Commonly known as: ULTRAM Take 1-2 tablets (50-100 mg total) by mouth every 12 (twelve) hours as needed for moderate pain.   vitamin C 500 MG tablet Commonly known as: ASCORBIC ACID Take 500 mg by mouth daily.   zinc gluconate 50 MG tablet Take 1 tablet (50 mg total) daily by mouth.      Follow-up Information    Janith Lima, MD. Schedule an appointment as soon as possible for a visit in 1 week(s).   Specialty: Internal Medicine Contact information: [address] [address] [phone]           Consultations:  None  Procedures/Studies:  2D Echo: None  Dg Chest 2 View  Result Date: 08/30/2019 CLINICAL DATA:   Incoherent since yesterday.  Fever. EXAM: CHEST - 2 VIEW COMPARISON:  CT 06/18/2019. Radiographs 11/13/2016. PET-CT 07/02/2019. FINDINGS: The heart size and mediastinal contours are stable. There is aortic atherosclerosis. There is new mild blunting of the left costophrenic angle laterally, suspicious for a small pleural effusion. There is no right-sided pleural effusion, pneumothorax, confluent airspace opacity or edema. Known small right upper lobe pulmonary nodule is not well seen. IMPRESSION: Possible small left pleural effusion. No other acute cardiopulmonary process. Electronically Signed   By: Richardean Sale M.D.   On: 08/30/2019 17:20   Dg Chest Port 1 View  Result Date: 09/01/2019 CLINICAL DATA:  Hypoxia. Tachypnea. EXAM: PORTABLE CHEST 1 VIEW COMPARISON:  08/30/2019 FINDINGS: The heart size and pulmonary vascularity are normal. Minimal atelectasis at the lung bases. Aortic atherosclerosis. No acute bone abnormality. IMPRESSION: 1. Minimal bibasilar atelectasis. 2. Aortic atherosclerosis. 3. No other significant abnormalities. Electronically Signed   By: Lorriane Shire M.D.   On: 09/01/2019  07:42   Vas [place] Lower Extremity Venous (dvt)  Result Date: 08/24/2019  Lower Venous Study Indications: Pain, and Swelling.  Limitations: Body habitus and poor ultrasound/tissue interface. Performing Technologist: Burley Saver RVT  Examination Guidelines: A complete evaluation includes B-mode imaging, spectral Doppler, color Doppler, and power Doppler as needed of all accessible portions of each vessel. Bilateral testing is considered an integral part of a complete examination. Limited examinations for reoccurring indications may be performed as noted.  +-----+---------------+---------+-----------+----------+--------------+ RIGHTCompressibilityPhasicitySpontaneityPropertiesThrombus Aging +-----+---------------+---------+-----------+----------+--------------+ CFV  Full           Yes      Yes                                  +-----+---------------+---------+-----------+----------+--------------+ SFJ  Full           Yes      Yes                                 +-----+---------------+---------+-----------+----------+--------------+   +---------+---------------+---------+-----------+---------------+--------------+ LEFT     CompressibilityPhasicitySpontaneityProperties     Thrombus Aging +---------+---------------+---------+-----------+---------------+--------------+ CFV      Full           Yes      Yes                                      +---------+---------------+---------+-----------+---------------+--------------+ SFJ      Full           Yes      Yes                                      +---------+---------------+---------+-----------+---------------+--------------+ FV Prox  Full           Yes      Yes                                      +---------+---------------+---------+-----------+---------------+--------------+ FV Mid   Full           Yes      Yes                                      +---------+---------------+---------+-----------+---------------+--------------+ FV DistalFull           Yes      Yes                                      +---------+---------------+---------+-----------+---------------+--------------+ PFV      Full           Yes      Yes                                      +---------+---------------+---------+-----------+---------------+--------------+ POP      Full           Yes      Yes                                      +---------+---------------+---------+-----------+---------------+--------------+  PTV      Full                    No                                       +---------+---------------+---------+-----------+---------------+--------------+ PERO     Full                    No                                       +---------+---------------+---------+-----------+---------------+--------------+ Gastroc   Partial                 Yes        partially      Continued                                                  re-cannalized                 +---------+---------------+---------+-----------+---------------+--------------+ GSV      Full           Yes      Yes                                      +---------+---------------+---------+-----------+---------------+--------------+ SSV      Full                                                             +---------+---------------+---------+-----------+---------------+--------------+    Findings reported to [facility] at 11:35.  Summary: Right: No evidence of common femoral vein obstruction. Left: Findings consistent with continued deep vein thrombosis involving the left gastocnemius veins. No cystic structure found in the popliteal fossa. All other veins visualized appear fully compressible and demonstrate appropriate Doppler characteristics.  *See table(s) above for measurements and observations. Electronically signed by Curt Jews MD on 08/24/2019 at 12:38:23 PM.    Final    Vas [place] Lower Extremity Venous (dvt) (only [place])  Result Date: 08/04/2019  Lower Venous Study Indications: Pain, Swelling, and total left knee replacement 07/26/2019.  Limitations: Body habitus and poor ultrasound/tissue interface. Comparison Study: No recent prior study. Performing Technologist: Maudry Mayhew MHA, RDMS, RVT, RDCS  Examination Guidelines: A complete evaluation includes B-mode imaging, spectral Doppler, color Doppler, and power Doppler as needed of all accessible portions of each vessel. Bilateral testing is considered an integral part of a complete examination. Limited examinations for reoccurring indications may be performed as noted.  +---------+---------------+---------+-----------+----------+--------------+ LEFT     CompressibilityPhasicitySpontaneityPropertiesThrombus Aging  +---------+---------------+---------+-----------+----------+--------------+ CFV      Full           Yes      Yes                                 +---------+---------------+---------+-----------+----------+--------------+  SFJ      Full                                                        +---------+---------------+---------+-----------+----------+--------------+ FV Prox  Full                                                        +---------+---------------+---------+-----------+----------+--------------+ FV Mid   Full                                                        +---------+---------------+---------+-----------+----------+--------------+ FV DistalFull                                                        +---------+---------------+---------+-----------+----------+--------------+ PFV      Full                                                        +---------+---------------+---------+-----------+----------+--------------+ POP      Full           Yes      Yes                                 +---------+---------------+---------+-----------+----------+--------------+ PTV      Full                                                        +---------+---------------+---------+-----------+----------+--------------+ PERO     Full                                                        +---------+---------------+---------+-----------+----------+--------------+ Gastroc  None                    No                   Acute          +---------+---------------+---------+-----------+----------+--------------+  Unable to visualize right common femoral vein due to patient position.  Summary: Left: Findings consistent with acute deep vein thrombosis involving the left gastrocnemius veins. No cystic structure found in the popliteal fossa.  *See table(s) above for measurements and observations. Electronically signed by Curt Jews MD on 08/04/2019 at 4:46:26 PM.     Final  Subjective: No major events overnight of this morning.  Afebrile for about 48 hours.  No complaint this morning.  Feels well and ready to go home.  She denies chest pain, dyspnea, cough, GI or GU symptoms.  Expresses appreciation about her care.   Discharge Exam: Vitals:   09/02/19 2100 09/03/19 0442  BP: (!) 128/52 (!) 159/62  Pulse: 89 91  Resp: 16 16  Temp: 98.6 F (37 C) 98.3 F (36.8 C)  SpO2: 90% (!) 89%    GENERAL: No acute distress.  Appears well.  HEENT: MMM.  Vision and hearing grossly intact.  NECK: Supple.  No JVD.  LUNGS:  No IWOB. Good air movement bilaterally. HEART:  RRR. Heart sounds normal.  ABD: Bowel sounds present. Soft. Non tender.  MSK/EXT:  Moves all extremities. No apparent deformity. No edema bilaterally. SKIN: no apparent skin lesion or wound NEURO: Awake, alert and oriented appropriately.  No gross deficit.  PSYCH: Calm. Normal affect.     The results of significant diagnostics from this hospitalization (including imaging, microbiology, ancillary and laboratory) are listed below for reference.     Microbiology: Recent Results (from the past 240 hour(s))  Blood culture (routine x 2)     Status: None (Preliminary result)   Collection Time: 08/30/19  7:03 PM   Specimen: BLOOD  Result Value Ref Range Status   Specimen Description   Final    BLOOD RIGHT WRIST Performed at [facility] [address] [place], [place] [address]    Special Requests   Final    BOTTLES DRAWN AEROBIC AND ANAEROBIC Blood Culture adequate volume Performed at [facility] [address], [place], [place] [address]    Culture   Final    NO GROWTH 3 DAYS Performed at [facility], [place] [address] [place], [place] [address]    Report Status PENDING  Incomplete  Blood culture (routine x 2)     Status: None (Preliminary result)   Collection Time: 08/30/19  7:07 PM   Specimen: BLOOD  Result Value Ref Range  Status   Specimen Description   Final    BLOOD LEFT ANTECUBITAL Performed at [facility] [address] [place], [place] [address]    Special Requests   Final    BOTTLES DRAWN AEROBIC AND ANAEROBIC Blood Culture adequate volume Performed at [facility] [address], [place], [place] [address]    Culture   Final    NO GROWTH 3 DAYS Performed at [facility], [place] [address] [place],  [address]    Report Status PENDING  Incomplete  SARS Coronavirus 2 [facility] order, Performed in [facility] hospital lab) Nasopharyngeal Nasopharyngeal Swab     Status: None   Collection Time: 08/30/19  9:00 PM   Specimen: Nasopharyngeal Swab  Result Value Ref Range Status   SARS Coronavirus 2 NEGATIVE NEGATIVE Final    Comment: (NOTE) If result is NEGATIVE SARS-CoV-2 target nucleic acids are NOT DETECTED. The SARS-CoV-2 RNA is generally detectable in upper and lower  respiratory specimens during the acute phase of infection. The lowest  concentration of SARS-CoV-2 viral copies this assay can detect is 250  copies / mL. A negative result does not preclude SARS-CoV-2 infection  and should not be used as the sole basis for treatment or other  patient management decisions.  A negative result may occur with  improper specimen collection / handling, submission of specimen other  than  nasopharyngeal swab, presence of viral mutation(s) within the  areas targeted by this assay, and inadequate number of viral copies  (<250 copies / mL). A negative result must be combined with clinical  observations, patient history, and epidemiological information. If result is POSITIVE SARS-CoV-2 target nucleic acids are DETECTED. The SARS-CoV-2 RNA is generally detectable in upper and lower  respiratory specimens dur ing the acute phase of infection.  Positive  results are indicative of active infection with SARS-CoV-2.  Clinical  correlation with patient history  and other diagnostic information is  necessary to determine patient infection status.  Positive results do  not rule out bacterial infection or co-infection with other viruses. If result is PRESUMPTIVE POSTIVE SARS-CoV-2 nucleic acids MAY BE PRESENT.   A presumptive positive result was obtained on the submitted specimen  and confirmed on repeat testing.  While 2019 novel coronavirus  (SARS-CoV-2) nucleic acids may be present in the submitted sample  additional confirmatory testing may be necessary for epidemiological  and / or clinical management purposes  to differentiate between  SARS-CoV-2 and other Sarbecovirus currently known to infect humans.  If clinically indicated additional testing with an alternate test  methodology [phone]) is advised. The SARS-CoV-2 RNA is generally  detectable in upper and lower respiratory sp ecimens during the acute  phase of infection. The expected result is Negative. Fact Sheet for Patients:  StrictlyIdeas.no Fact Sheet for Healthcare Providers: BankingDealers.co.za This test is not yet approved or cleared by the [place] FDA and has been authorized for detection and/or diagnosis of SARS-CoV-2 by FDA under an Emergency Use Authorization (EUA).  This EUA will remain in effect (meaning this test can be used) for the duration of the COVID-19 declaration under Section 564(b)(1) of the Act, 21 U.S.C. section 360bbb-3(b)(1), unless the authorization is terminated or revoked sooner. Performed at [facility], [place] [address], [place], [place] [address]   Culture, Urine     Status: Abnormal   Collection Time: 09/01/19  7:46 AM   Specimen: Urine, Clean Catch  Result Value Ref Range Status   Specimen Description   Final    URINE, CLEAN CATCH Performed at [facility], [place] [address] [place], [place] [address]    Special Requests   Final    NONE Performed at [facility], [place] [address] [place], [place] [address]    Culture (A)  Final    <10,000 COLONIES/mL INSIGNIFICANT GROWTH Performed at [facility] [address], [place], [place] [address]    Report Status 09/02/2019 FINAL  Final     Labs: BNP (last 3 results) No results for input(s): BNP in the last 8760 hours. Basic Metabolic Panel: Recent Labs  Lab 08/30/19 1903 08/31/19 0536 09/01/19 0537 09/02/19 0537 09/03/19 0551  NA 136 138 140 139 140  K 4.3 3.5 4.3 4.4 3.9  CL 106 110 110 111 110  CO2 21* 25 19* 19* 19*  GLUCOSE 138* 136* 124* 109* 114*  BUN 42* 36* 34* 32* 31*  CREATININE 1.62* 1.34* 1.35* 1.33* 1.21*  CALCIUM 8.4* 7.1* 7.9* 7.9* 8.4*  MG  --   --  2.3 2.3  --    Liver Function Tests: Recent Labs  Lab 08/30/19 1903 09/01/19 0537  AST 22 24  ALT 16 19  ALKPHOS 62 51  BILITOT 2.9* 1.5*  PROT 6.6 5.3*  ALBUMIN 4.0 2.9*   No results for input(s): LIPASE, AMYLASE in the last 168 hours. No  results for input(s): AMMONIA in the last 168 hours. CBC: Recent Labs  Lab 08/30/19 1903  08/31/19 0750 08/31/19 1341 09/01/19 0537 09/01/19 2340 09/02/19 0708 09/03/19 0551  WBC 6.3  --  4.9 6.1 5.5  --  5.8  --   NEUTROABS 4.9  --   --   --   --   --   --   --   HGB 6.9*   < > 7.3* 8.1* 7.2* 8.4* 8.2* 8.4*  HCT 21.5*   < > 23.3* 26.5* 23.2* 26.4* 26.2* 26.7*  MCV 94.3  --  96.7 96.7 94.7  --  92.9  --   PLT 174  --  90* 139* 147*  --  163  --    < > = values in this interval not displayed.   Cardiac Enzymes: No results for input(s): CKTOTAL, CKMB, CKMBINDEX, TROPONINI in the last 168 hours. BNP: Invalid input(s): POCBNP CBG: No results for input(s): GLUCAP in the last 168 hours. D-Dimer No results for input(s): DDIMER in the last 72 hours. Hgb A1c No results for input(s): HGBA1C in the last 72 hours. Lipid Profile No results for input(s): CHOL, HDL, LDLCALC, TRIG, CHOLHDL, LDLDIRECT in the last 72 hours. Thyroid function  studies No results for input(s): TSH, T4TOTAL, T3FREE, THYROIDAB in the last 72 hours.  Invalid input(s): FREET3 Anemia work up No results for input(s): VITAMINB12, FOLATE, FERRITIN, TIBC, IRON, RETICCTPCT in the last 72 hours. Urinalysis    Component Value Date/Time   COLORURINE AMBER (A) 08/30/2019 1916   APPEARANCEUR CLOUDY (A) 08/30/2019 1916   LABSPEC 1.014 08/30/2019 1916   PHURINE 6.0 08/30/2019 1916   GLUCOSEU NEGATIVE 08/30/2019 1916   GLUCOSEU NEGATIVE 06/08/2019 1109   HGBUR MODERATE (A) 08/30/2019 1916   HGBUR moderate 10/06/2009 1602   BILIRUBINUR NEGATIVE 08/30/2019 1916   BILIRUBINUR 1+ 03/16/2018 1327   KETONESUR NEGATIVE 08/30/2019 1916   PROTEINUR 100 (A) 08/30/2019 1916   UROBILINOGEN 0.2 06/08/2019 1109   NITRITE NEGATIVE 08/30/2019 1916   LEUKOCYTESUR LARGE (A) 08/30/2019 1916   Sepsis Labs Invalid input(s): PROCALCITONIN,  WBC,  LACTICIDVEN   Time coordinating discharge: 35 minutes  SIGNED:  Mercy Riding, MD  [facility] 09/03/2019, 7:46 AM  If 7PM-7AM, please contact night-coverage [email or web address] Password [email or web address]

## 2019-09-03 NOTE — Plan of Care (Signed)
Pt ready for DC home 

## 2019-09-03 NOTE — Progress Notes (Signed)
Occupational Therapy Treatment Patient Details Name: Claudia Lara MRN: [id number] DOB: [birth year] Today's Date: 09/03/2019    History of present illness Pt is an 85 yo female admitted with anemia, UTI w sepsis, confusion, and fever.  Pt had blood transfusion and did not get out of bed for 3 days until this eval.  pt with PMH of anemia, B TKAS, THR.    OT comments  Pt. Seen for skilled OT treatment session.  Able to complete bed mobility, toileting, and grooming tasks in standing with min guard a.  D/c home likely later today. Reports she has a/e for LB adls and has friends and family to assist as needed.   Follow Up Recommendations  Home health OT;Supervision - Intermittent    Equipment Recommendations  None recommended by OT    Recommendations for Other Services      Precautions / Restrictions Precautions Precautions: Fall Restrictions Other Position/Activity Restrictions: had TKR 5 weeks ago       Mobility Bed Mobility Overal bed mobility: Needs Assistance Bed Mobility: Supine to Sit     Supine to sit: Supervision     General bed mobility comments: hob flat, exits from L  side, adj. height of bed to mimic hers at home  Transfers Overall transfer level: Needs assistance Equipment used: Rolling walker (2 wheeled) Transfers: Sit to/from [employer] Transfers   Stand pivot transfers: Min guard            Balance                                           ADL either performed or assessed with clinical judgement   ADL Overall ADL's : Needs assistance/impaired     Grooming: Wash/dry hands;Min guard;Standing               Lower Body Dressing: Minimal assistance Lower Body Dressing Details (indicate cue type and reason): pt uses sock aid, min a from therapist asst. without sock aide today Toilet Transfer: Min Firefighter Details (indicate cue type and reason): 3n1 over commode, same set up  as at home Toileting- Water quality scientist and Hygiene: Supervision/safety;Sit to/from Nurse, children's Details (indicate cue type and reason): simulated to clear ledge of tub in standing with RW. states her LLE "is not bending as good as it was" states she may do sponge bathing initially Functional mobility during ADLs: Min guard;Rolling walker General ADL Comments: states she has a/e at home for LB ADLs, and a friend will be with her to assist as needed     Vision       Perception     Praxis      Cognition Arousal/Alertness: Awake/alert Behavior During Therapy: WFL for tasks assessed/performed Overall Cognitive Status: Within Functional Limits for tasks assessed                                          Exercises     Shoulder Instructions       General Comments      Pertinent Vitals/ Pain       Pain Assessment: No/denies pain  Home Living  Prior Functioning/Environment              Frequency  Min 2X/week        Progress Toward Goals  OT Goals(current goals can now be found in the care plan section)  Progress towards OT goals: Progressing toward goals     Plan      Co-evaluation                 AM-PAC OT "6 Clicks" Daily Activity     Outcome Measure   Help from another person eating meals?: None Help from another person taking care of personal grooming?: None Help from another person toileting, which includes using toliet, bedpan, or urinal?: None Help from another person bathing (including washing, rinsing, drying)?: A Little Help from another person to put on and taking off regular upper body clothing?: None Help from another person to put on and taking off regular lower body clothing?: A Little 6 Click Score: 22    End of Session Equipment Utilized During Treatment: Rolling walker;Gait belt  OT Visit Diagnosis: Other abnormalities of gait and  mobility (R26.89)   Activity Tolerance Patient tolerated treatment well   Patient Left in chair;with call bell/phone within reach   Nurse Communication          Time: [id number] OT Time Calculation (min): 16 min  Charges: OT General Charges $OT Visit: 1 Visit OT Treatments $Self Care/Home Management : 8-22 mins   Janice Coffin, COTA/L 09/03/2019, 9:01 AM

## 2019-09-03 NOTE — Care Management Important Message (Signed)
Important Message  Patient Details IM Letter given to Sharren Bridge SW to present to the Patient Name: Claudia Lara MRN: [id number] Date of Birth: 1932-09-08   Medicare Important Message Given:  Yes     Kerin Salen 09/03/2019, 10:25 AM

## 2019-09-04 LAB — CULTURE, BLOOD (ROUTINE X 2)
Culture: NO GROWTH
Culture: NO GROWTH
Special Requests: ADEQUATE
Special Requests: ADEQUATE

## 2019-09-06 ENCOUNTER — Ambulatory Visit: Payer: Medicare Other | Attending: Orthopedic Surgery | Admitting: Physical Therapy

## 2019-09-06 ENCOUNTER — Telehealth: Payer: Self-pay | Admitting: *Deleted

## 2019-09-06 ENCOUNTER — Encounter: Payer: Self-pay | Admitting: Physical Therapy

## 2019-09-06 ENCOUNTER — Other Ambulatory Visit: Payer: Self-pay

## 2019-09-06 DIAGNOSIS — M25562 Pain in left knee: Secondary | ICD-10-CM | POA: Insufficient documentation

## 2019-09-06 DIAGNOSIS — R262 Difficulty in walking, not elsewhere classified: Secondary | ICD-10-CM | POA: Diagnosis not present

## 2019-09-06 DIAGNOSIS — M25662 Stiffness of left knee, not elsewhere classified: Secondary | ICD-10-CM | POA: Diagnosis not present

## 2019-09-06 DIAGNOSIS — R600 Localized edema: Secondary | ICD-10-CM | POA: Diagnosis not present

## 2019-09-06 NOTE — Telephone Encounter (Signed)
Transition Care Management Follow-up Telephone Call  Date discharged? 09/03/19   How have you been since you were released from the hospital? Pt states she is doing alright   Do you understand why you were in the hospital? YES   Do you understand the discharge instructions? YES   Where were you discharged to? HOME   Items Reviewed:  Medications reviewed: YES, no longer taking ASA, and oxycodone  Allergies reviewed: YES  Dietary changes reviewed: YES  Referrals reviewed: YES, no referral recommeded   Functional Questionnaire:   Activities of Daily Living (ADLs):   She states she are independent in the following: bathing and hygiene, feeding, continence, grooming, toileting and dressing States she require assistance with the following: ambulation she states she was only using her cane, but lying in the hospital bed has mess her knee back up. She is know having to use her walker as well   Any transportation issues/concerns?: NO   Any patient concerns? NO   Confirmed importance and date/time of follow-up visits scheduled YES, appt 09/08/19  Provider Appointment booked with Dr. Ronnald Ramp  Confirmed with patient if condition begins to worsen call PCP or go to the ER.  Patient was given the office number and encouraged to call back with question or concerns.  : YES

## 2019-09-06 NOTE — Therapy (Signed)
[facility] [address] [place], [place], [address] Phone: [phone]   Fax:  [phone]  Physical Therapy Treatment  Patient Details  Name: Claudia Lara MRN: [id number] Date of Birth: December 06, 1931 Referring Provider (PT): Alusio   Encounter Date: 09/06/2019  PT End of Session - 09/06/19 7482    Visit Number  8    Date for PT Re-Evaluation  10/09/19    PT Start Time  1448    PT Stop Time  1530    PT Time Calculation (min)  42 min    Activity Tolerance  Patient tolerated treatment well    Behavior During Therapy  [facility] for tasks assessed/performed       Past Medical History:  Diagnosis Date  . ANEMIA 08/22/2009  . Aortic stenosis   . AR (aortic regurgitation) 01/2019   Mild to Moderate, Noted on ECHO  . CAROTID ARTERY STENOSIS 01/16/2010  . Complication of anesthesia    very hard to wake up from surgery  . DYSPNEA ON EXERTION 11/16/2007  . Gallstones   . GEN OSTEOARTHROSIS INVOLVING MULTIPLE SITES 11/11/2008  . GERD (gastroesophageal reflux disease)   . Grade I diastolic dysfunction 70/7867   Noted on ECHO  . Heart murmur   . HEMORRHOIDS, INTERNAL    surgery was in the 90's (per patient)  . History of blood transfusion    after hip and knee replacements  . NECK PAIN, ACUTE 12/28/2009  . PERIPHERAL EDEMA 10/06/2007  . PONV (postoperative nausea and vomiting)   . Pulmonary nodule 06/18/2019   noted on CT Chest   . PVD 10/06/2007  . Unspecified essential hypertension 10/19/2007    Past Surgical History:  Procedure Laterality Date  . ABDOMINAL HYSTERECTOMY    . BIOPSY  08/06/2019   Procedure: BIOPSY;  Surgeon: Gatha Mayer, MD;  Location: [facility];  Service: Gastroenterology;;  . [name]     bilat  . CARPAL TUNNEL RELEASE    . CHOLECYSTECTOMY  01/29/2016   at [facility]  . CHOLECYSTECTOMY    . ENDOSCOPIC RETROGRADE CHOLANGIOPANCREATOGRAPHY (ERCP) WITH PROPOFOL N/A 12/01/2015    Procedure: ENDOSCOPIC RETROGRADE CHOLANGIOPANCREATOGRAPHY (ERCP) WITH PROPOFOL;  Surgeon: [name], MD;  Location: [facility];  Service: Endoscopy;  Laterality: N/A;  . ENDOSCOPIC RETROGRADE CHOLANGIOPANCREATOGRAPHY (ERCP) WITH PROPOFOL N/A 02/01/2016   Procedure: ENDOSCOPIC RETROGRADE CHOLANGIOPANCREATOGRAPHY (ERCP) WITH PROPOFOL;  Surgeon: [name], MD;  Location: [facility];  Service: Endoscopy;  Laterality: N/A;  . ENDOSCOPIC RETROGRADE CHOLANGIOPANCREATOGRAPHY (ERCP) WITH PROPOFOL N/A 04/07/2018   Procedure: ENDOSCOPIC RETROGRADE CHOLANGIOPANCREATOGRAPHY (ERCP) WITH PROPOFOL;  Surgeon: [name], MD;  Location: [facility];  Service: Endoscopy;  Laterality: N/A;  . ESOPHAGOGASTRODUODENOSCOPY (EGD) WITH PROPOFOL N/A 08/06/2019   Procedure: ESOPHAGOGASTRODUODENOSCOPY (EGD) WITH PROPOFOL;  Surgeon: Gatha Mayer, MD;  Location: [facility];  Service: Gastroenterology;  Laterality: N/A;  . HAMMER TOE SURGERY    . HEMORRHOID SURGERY    . Hyperplastic colon polyps, removed  2007   By Dr. Penelope Coop  . JOINT REPLACEMENT  2011   right knee  . OOPHORECTOMY    . REMOVAL OF STONES  04/07/2018   Procedure: REMOVAL OF STONES;  Surgeon: [name], MD;  Location: [facility];  Service: Endoscopy;;  . ROTATOR CUFF REPAIR  2004   left (Dr. Durward Fortes)  . SPHINCTEROTOMY  04/07/2018   Procedure: SPHINCTEROTOMY;  Surgeon: [name], MD;  Location: [facility];  Service: Endoscopy;;  . TOTAL HIP  ARTHROPLASTY Right 11/26/2016   Procedure: RIGHT TOTAL HIP ARTHROPLASTY;  Surgeon: Garald Balding, MD;  Location: [facility];  Service: Orthopedics;  Laterality: Right;  . TOTAL KNEE ARTHROPLASTY Right   . TOTAL KNEE ARTHROPLASTY Left 07/26/2019   Procedure: TOTAL KNEE ARTHROPLASTY;  Surgeon: Gaynelle Arabian, MD;  Location: [facility];  Service: Orthopedics;  Laterality: Left;  39mn    There were no vitals filed for this visit.  Subjective Assessment - 09/06/19 1457    Subjective   Patient reports that she was admitted to the hospital last Monday and was doischarged on Friday.  She had anemia and a UTI, she report sthat she was on her back all day everyday and is now very weak and stiff, reports that she is back to using the walker.    Currently in Pain?  Yes    Pain Score  3     Pain Location  Knee    Pain Orientation  Right;Left    Pain Descriptors / Indicators  Sore;Tightness    Aggravating Factors   not moving for a week         [facility] PT Assessment - 09/06/19 0001      AROM   Left Knee Extension  20    Left Knee Flexion  95      PROM   Left Knee Extension  12    Left Knee Flexion  105                   [facility] Adult PT Treatment/Exercise - 09/06/19 0001      Ambulation/Gait   Gait Comments  gait in with a wlaker and close CGA, gait out of the building with a SPC CGA, some cues      Knee/Hip Exercises: Aerobic   Recumbent Bike  bike partial revolutions x 6 minutes, was able to go around at the end with seat position #^    Nustep  L 3 7 min      Manual Therapy   Manual Therapy  Passive ROM;Soft tissue mobilization    Soft tissue mobilization  scar mobilization    Passive ROM  PROM of the left knee flexion and extnension               PT Short Term Goals - 08/25/19 1011      PT SHORT TERM GOAL #1   Title  independent with initial HEP        PT Long Term Goals - 09/06/19 1533      PT LONG TERM GOAL #1   Title  walk 200 feet with SPC or no device    Status  On-going      PT LONG TERM GOAL #2   Title  go up her steps step over step    Status  On-going      PT LONG TERM GOAL #3   Title  increase AROM of the left knee to 5-115 degrees flexion    Status  On-going      PT LONG TERM GOAL #4   Title  decrease pain 50%    Status  Partially Met      PT LONG TERM GOAL #5   Title  decrease TUG to 16 seconds    Status  On-going            Plan - 09/06/19 1531    Clinical Impression Statement  Patient has had a  pretty significant set back, she was in the hospital  from Monday to Friday with anemia and an UTI, she comes in today with a FWW, prior to hospital she was using a SPC, her ROM is much decresaed with c/o tightness and soreness , she c/o tightness in both legs, reports that she was "flat on my back for 5 days"    PT Next Visit Plan  she did not get tosee the MD, will resume our treatment and regain the motion    Consulted and Agree with Plan of Care  Patient       Patient will benefit from skilled therapeutic intervention in order to improve the following deficits and impairments:  Abnormal gait, Pain, Decreased scar mobility, Decreased mobility, Cardiopulmonary status limiting activity, Decreased activity tolerance, Decreased endurance, Decreased range of motion, Decreased strength, Increased edema, Difficulty walking, Decreased balance  Visit Diagnosis: Stiffness of left knee, not elsewhere classified  Acute pain of left knee  Difficulty in walking, not elsewhere classified  Localized edema     Problem List Patient Active Problem List   Diagnosis Date Noted  . Sepsis ([place]) 08/31/2019  . Anemia 08/31/2019  . AKI (acute kidney injury) ([place]) 08/31/2019  . Acute metabolic encephalopathy 75/79/7282  . Sepsis due to urinary tract infection ([place]) 08/31/2019  . UTI (urinary tract infection) 08/30/2019  . Lower leg DVT (deep venous thromboembolism), acute, left ([place]) 08/18/2019  . Cameron ulcer, acute   . Gastric polyp   . Acute deep vein thrombosis (DVT) of calf muscle vein of left lower extremity ([place])   . Hypotension due to hypovolemia 08/04/2019  . Deficiency anemia 08/04/2019  . OA (osteoarthritis) of knee 07/26/2019  . Lung nodule 06/24/2019  . Bilateral leg edema 06/08/2019  . Zinc deficiency 10/03/2017  . Spondylosis, lumbar, with myelopathy 04/09/2017  . Hyperglycemia 03/05/2016  . Melena 08/03/2015  . GERD (gastroesophageal reflux disease) 08/03/2015  . Hemolytic anemia  ([place]) 07/12/2015  . Chronic venous insufficiency 08/10/2014  . Aortic stenosis, mild 06/29/2014  . Left ventricular diastolic dysfunction, NYHA class 1 06/29/2014  . Senile osteopenia 04/21/2014  . Hyperlipidemia with target LDL less than 130 04/21/2014  . Kidney disease, chronic, stage III (GFR 30-59 ml/min) ([place]) 04/21/2014  . Routine health maintenance 07/16/2012  . Carotid artery stenosis without cerebral infarction 01/16/2010  . Essential hypertension 10/19/2007    Sumner Boast., PT 09/06/2019, 3:35 PM  [address] [place], [place], [address] Phone: [phone]   Fax:  [phone]  Name: Claudia Lara MRN: [id number] Date of Birth: 16-Dec-1931

## 2019-09-07 DIAGNOSIS — Z471 Aftercare following joint replacement surgery: Secondary | ICD-10-CM | POA: Diagnosis not present

## 2019-09-07 DIAGNOSIS — Z96652 Presence of left artificial knee joint: Secondary | ICD-10-CM | POA: Diagnosis not present

## 2019-09-08 ENCOUNTER — Other Ambulatory Visit (INDEPENDENT_AMBULATORY_CARE_PROVIDER_SITE_OTHER): Payer: Medicare Other

## 2019-09-08 ENCOUNTER — Other Ambulatory Visit: Payer: Self-pay

## 2019-09-08 ENCOUNTER — Encounter: Payer: Self-pay | Admitting: Internal Medicine

## 2019-09-08 ENCOUNTER — Ambulatory Visit (INDEPENDENT_AMBULATORY_CARE_PROVIDER_SITE_OTHER): Payer: Medicare Other | Admitting: Internal Medicine

## 2019-09-08 VITALS — BP 154/62 | HR 76 | Temp 97.9°F | Resp 16 | Ht 62.9 in | Wt 171.0 lb

## 2019-09-08 DIAGNOSIS — N1832 Chronic kidney disease, stage 3b: Secondary | ICD-10-CM

## 2019-09-08 DIAGNOSIS — D599 Acquired hemolytic anemia, unspecified: Secondary | ICD-10-CM

## 2019-09-08 DIAGNOSIS — I82462 Acute embolism and thrombosis of left calf muscular vein: Secondary | ICD-10-CM

## 2019-09-08 DIAGNOSIS — J849 Interstitial pulmonary disease, unspecified: Secondary | ICD-10-CM | POA: Insufficient documentation

## 2019-09-08 DIAGNOSIS — I824Z2 Acute embolism and thrombosis of unspecified deep veins of left distal lower extremity: Secondary | ICD-10-CM

## 2019-09-08 DIAGNOSIS — D500 Iron deficiency anemia secondary to blood loss (chronic): Secondary | ICD-10-CM

## 2019-09-08 LAB — HEPATIC FUNCTION PANEL
ALT: 17 U/L (ref 0–35)
AST: 16 U/L (ref 0–37)
Albumin: 3.8 g/dL (ref 3.5–5.2)
Alkaline Phosphatase: 77 U/L (ref 39–117)
Bilirubin, Direct: 0.4 mg/dL — ABNORMAL HIGH (ref 0.0–0.3)
Total Bilirubin: 1.3 mg/dL — ABNORMAL HIGH (ref 0.2–1.2)
Total Protein: 6.4 g/dL (ref 6.0–8.3)

## 2019-09-08 LAB — BASIC METABOLIC PANEL
BUN: 20 mg/dL (ref 6–23)
CO2: 25 mEq/L (ref 19–32)
Calcium: 9 mg/dL (ref 8.4–10.5)
Chloride: 109 mEq/L (ref 96–112)
Creatinine, Ser: 1.13 mg/dL (ref 0.40–1.20)
GFR: 45.71 mL/min — ABNORMAL LOW (ref >60.00)
Glucose, Bld: 107 mg/dL — ABNORMAL HIGH (ref 70–99)
Potassium: 5 mEq/L (ref 3.5–5.1)
Sodium: 142 mEq/L (ref 135–145)

## 2019-09-08 LAB — CBC WITH DIFFERENTIAL/PLATELET
Basophils Absolute: 0.1 10*3/uL (ref 0.0–0.1)
Basophils Relative: 0.6 % (ref 0.0–3.0)
Eosinophils Absolute: 0.2 10*3/uL (ref 0.0–0.7)
Eosinophils Relative: 1.5 % (ref 0.0–5.0)
HCT: 27.2 % — ABNORMAL LOW (ref 36.0–46.0)
Hemoglobin: 9.2 g/dL — ABNORMAL LOW (ref 12.0–15.0)
Lymphocytes Relative: 10.3 % — ABNORMAL LOW (ref 12.0–46.0)
Lymphs Abs: 1.1 10*3/uL (ref 0.7–4.0)
MCHC: 33.8 g/dL (ref 30.0–36.0)
MCV: 87.5 fl (ref 78.0–100.0)
Monocytes Absolute: 0.7 10*3/uL (ref 0.1–1.0)
Monocytes Relative: 6.9 % (ref 3.0–12.0)
Neutro Abs: 8.4 10*3/uL — ABNORMAL HIGH (ref 1.4–7.7)
Neutrophils Relative %: 80.7 % — ABNORMAL HIGH (ref 43.0–77.0)
Platelets: 327 10*3/uL (ref 150.0–400.0)
RBC: 3.1 Mil/uL — ABNORMAL LOW (ref 3.87–5.11)
RDW: 19 % — ABNORMAL HIGH (ref 11.5–15.5)
WBC: 10.4 10*3/uL (ref 4.0–10.5)

## 2019-09-08 NOTE — Patient Instructions (Signed)
Iron Deficiency Anemia, Adult Iron deficiency anemia is a condition in which the concentration of red blood cells or hemoglobin in the blood is below normal because of too little iron. Hemoglobin is a substance in red blood cells that carries oxygen to the body's tissues. When the concentration of red blood cells or hemoglobin is too low, not enough oxygen reaches these tissues. Iron deficiency anemia is usually long-lasting (chronic) and it develops over time. It may or may not cause symptoms. It is a common type of anemia. What are the causes? This condition may be caused by:  Not enough iron in the diet.  Blood loss caused by bleeding in the intestine.  Blood loss from a gastrointestinal condition like Crohn disease.  Frequent blood draws, such as from blood donation.  Abnormal absorption in the gut.  Heavy menstrual periods in women.  Cancers of the gastrointestinal system, such as colon cancer. What are the signs or symptoms? Symptoms of this condition may include:  Fatigue.  Headache.  Pale skin, lips, and nail beds.  Poor appetite.  Weakness.  Shortness of breath.  Dizziness.  Cold hands and feet.  Fast or irregular heartbeat.  Irritability. This is more common in severe anemia.  Rapid breathing. This is more common in severe anemia. Mild anemia may not cause any symptoms. How is this diagnosed? This condition is diagnosed based on:  Your medical history.  A physical exam.  Blood tests. You may have additional tests to find the underlying cause of your anemia, such as:  Testing for blood in the stool (fecal occult blood test).  A procedure to see inside your colon and rectum (colonoscopy).  A procedure to see inside your esophagus and stomach (endoscopy).  A test in which cells are removed from bone marrow (bone marrow aspiration) or fluid is removed from the bone marrow to be examined (biopsy). This is rarely needed. How is this treated? This  condition is treated by correcting the cause of your iron deficiency. Treatment may involve:  Adding iron-rich foods to your diet.  Taking iron supplements. If you are pregnant or breastfeeding, you may need to take extra iron because your normal diet usually does not provide the amount of iron that you need.  Increasing vitamin C intake. Vitamin C helps your body absorb iron. Your health care provider may recommend that you take iron supplements along with a glass of orange juice or a vitamin C supplement.  Medicines to make heavy menstrual flow lighter.  Surgery. You may need repeat blood tests to determine whether treatment is working. Depending on the underlying cause, the anemia should be corrected within 2 months of starting treatment. If the treatment does not seem to be working, you may need more testing. Follow these instructions at home: Medicines  Take over-the-counter and prescription medicines only as told by your health care provider. This includes iron supplements and vitamins.  If you cannot tolerate taking iron supplements by mouth, talk with your health care provider about taking them through a vein (intravenously) or an injection into a muscle.  For the best iron absorption, you should take iron supplements when your stomach is empty. If you cannot tolerate them on an empty stomach, you may need to take them with food.  Do not drink milk or take antacids at the same time as your iron supplements. Milk and antacids may interfere with iron absorption.  Iron supplements can cause constipation. To prevent constipation, include fiber in your diet as told   by your health care provider. A stool softener may also be recommended. Eating and drinking   Talk with your health care provider before changing your diet. He or she may recommend that you eat foods that contain a lot of iron, such as: ? Liver. ? Low-fat (lean) beef. ? Breads and cereals that have iron added to them (are  fortified). ? Eggs. ? Dried fruit. ? Dark green, leafy vegetables.  To help your body use the iron from iron-rich foods, eat those foods at the same time as fresh fruits and vegetables that are high in vitamin C. Foods that are high in vitamin C include: ? Oranges. ? Peppers. ? Tomatoes. ? Mangoes.  Drinkenoughfluid to keep your urine clear or pale yellow. General instructions  Return to your normal activities as told by your health care provider. Ask your health care provider what activities are safe for you.  Practice good hygiene. Anemia can make you more prone to illness and infection.  Keep all follow-up visits as told by your health care provider. This is important. Contact a health care provider if:  You feel nauseous or you vomit.  You feel weak.  You have unexplained sweating.  You develop symptoms of constipation, such as: ? Having fewer than three bowel movements a week. ? Straining to have a bowel movement. ? Having stools that are hard, dry, or larger than normal. ? Feeling full or bloated. ? Pain in the lower abdomen. ? Not feeling relief after having a bowel movement. Get help right away if:  You faint. If this happens, do not drive yourself to the hospital. Call your local emergency services (911 in the [place]  You have chest pain.  You have shortness of breath that: ? Is severe. ? Gets worse with physical activity.  You have a rapid heartbeat.  You become light-headed when getting up from a sitting or lying down position. This information is not intended to replace advice given to you by your health care provider. Make sure you discuss any questions you have with your health care provider. Document Released: 11/08/2000 Document Revised: 10/24/2017 Document Reviewed: 07/31/2016 Elsevier Patient Education  2020 [employer]  

## 2019-09-08 NOTE — Progress Notes (Signed)
Subjective:  Patient ID: Claudia Lara, female    DOB: [birth year]  Age: 85 y.o. MRN: [id number]  CC: Anemia   HPI TRULEE BATES presents for f/up -- She was recently me admitted and treated for urosepsis.  She has completed the antibiotics.  She complains that her stool has been yellow recently but she denies abdominal pain, nausea, vomiting, diarrhea, loss of appetite, or weight loss.  She continues to complain of shortness of breath.  She received another transfusion during her recent admission.  Her iron level was low.  She is not currently taking an iron supplement.  She tells me her orthopedist says that her left knee is coming along nicely.  She reports that the swelling in her lower extremities is getting better.  Discharge Diagnoses:  Sepsis likely due to UTI: Sepsis physiology resolved.  Symptoms resolved.  Urine culture without significant growth but obtained after IV antibiotics.  No history of ESBL or MDR organisms on previous cultures. Blood cultures negative so far.  -IV ceftriaxone 10/5-10/9.  Discharged on Keflex for 3 more days.  Iron deficiency anemia: FOBT is negative. History of hemolytic anemia but haptoglobin normal. Iron sat 7%.  -Hgb 6.9 (admit)>1u>5.8>3u>8.2> 8.4 -Suspect hemoconcentration and falsely elevated Hgb on admit followed by hemodilution due to IVF. -Discharged on ferrous sulfate and PRN Colace.  AKI on CKD-3 with mild azotemia: suspect some prerenal etiology.  Was also on high-dose aspirin for DVT prophylaxis after knee replacement.  AKI resolved with hydration. -Off aspirin now -Recheck BMP at follow-up.  Mild hyperbilirubinemia: Appears chronic. History of hemolytic anemia but hemoglobin normal but haptoglobin and H&H stable. -Recheck CMP at follow-up.  Chronic lower extremity edema -Discharged on home torsemide.  Essential hypertension//tachycardia: Resolved. -Discharged on home Coreg.  Acute metabolic encephalopathy: Could be due  to sepsis from UTI. Resolved.  Thrombocytopenia: Likely dilutional.Resolved. -Monitor  Patient was evaluated by PT/OT and discharge home with home health PT/OT.   Outpatient Medications Prior to Visit  Medication Sig Dispense Refill  . Calcium Carb-Cholecalciferol (CALCIUM CARBONATE-VITAMIN D3 PO) Take 1 tablet by mouth daily.     . carvedilol (COREG) 12.5 MG tablet TAKE 1 TABLET (12.5 MG TOTAL) BY MOUTH 2 (TWO) TIMES DAILY WITH A MEAL. 180 tablet 1  . cholecalciferol (VITAMIN D) 1000 units tablet Take 1,000 Units by mouth daily.    [name] docusate sodium (COLACE) 100 MG capsule Take 1 capsule (100 mg total) by mouth daily as needed. 30 capsule 2  . ferrous sulfate 325 (65 FE) MG tablet Take 1 tablet (325 mg total) by mouth 2 (two) times daily with a meal. 40 tablet 0  . gabapentin (NEURONTIN) 100 MG capsule Take 1 capsule (100 mg total) by mouth 3 (three) times daily. 1 tab 3x/day for 2 weeks. 1 tab 2x/day for 2 weeks. 1 tab a day for 2 weeks 84 capsule 0  . Multiple Vitamin (MULTIVITAMIN WITH MINERALS) TABS tablet Take 1 tablet by mouth daily.    . pantoprazole (PROTONIX) 40 MG tablet Take 1 tablet (40 mg total) by mouth daily. 90 tablet 1  . torsemide (DEMADEX) 20 MG tablet Take 1 tablet (20 mg total) by mouth daily. 90 tablet 1  . traMADol (ULTRAM) 50 MG tablet Take 1-2 tablets (50-100 mg total) by mouth every 12 (twelve) hours as needed for moderate pain. 56 tablet 0  . vitamin C (ASCORBIC ACID) 500 MG tablet Take 500 mg by mouth daily.    [name] zinc gluconate 50 MG tablet  Take 1 tablet (50 mg total) daily by mouth. 90 tablet 1   No facility-administered medications prior to visit.     ROS Review of Systems  Constitutional: Negative for diaphoresis, fatigue and unexpected weight change.  HENT: Negative.  Negative for trouble swallowing.   Eyes: Negative.   Respiratory: Positive for shortness of breath. Negative for cough and chest tightness.   Cardiovascular: Positive for leg  swelling. Negative for chest pain and palpitations.  Gastrointestinal: Negative for abdominal pain, blood in stool, constipation, diarrhea, nausea and vomiting.  Endocrine: Negative.   Genitourinary: Negative.  Negative for difficulty urinating, dysuria and hematuria.  Musculoskeletal: Positive for arthralgias. Negative for myalgias.  Skin: Positive for pallor. Negative for color change.  Neurological: Negative.  Negative for dizziness, weakness and light-headedness.  Hematological: Negative for adenopathy. Does not bruise/bleed easily.  Psychiatric/Behavioral: Negative.     Objective:  BP (!) 154/62 (BP Location: Left Arm, Patient Position: Sitting, Cuff Size: Normal)   Pulse 76   Temp 97.9 F (36.6 C) (Oral)   Resp 16   Ht 5' 2.9" (1.598 m)   Wt 171 lb (77.6 kg)   SpO2 94%   BMI 30.39 kg/m   BP Readings from Last 3 Encounters:  09/08/19 (!) 154/62  09/03/19 (!) 159/62  08/18/19 (!) 130/50    Wt Readings from Last 3 Encounters:  09/08/19 171 lb (77.6 kg)  09/02/19 171 lb 15.3 oz (78 kg)  08/18/19 172 lb (78 kg)    Physical Exam Vitals signs reviewed.  Constitutional:      Appearance: Normal appearance. She is not ill-appearing or diaphoretic.  HENT:     Nose: Nose normal.     Mouth/Throat:     Mouth: Mucous membranes are moist.  Eyes:     General: No scleral icterus.    Conjunctiva/sclera: Conjunctivae normal.  Neck:     Musculoskeletal: Neck supple.  Cardiovascular:     Rate and Rhythm: Normal rate and regular rhythm.     Heart sounds: Murmur present.  Pulmonary:     Effort: Pulmonary effort is normal.     Breath sounds: No stridor. No wheezing, rhonchi or rales.  Abdominal:     General: Abdomen is protuberant. Bowel sounds are normal.     Palpations: There is no hepatomegaly or splenomegaly.     Tenderness: There is no abdominal tenderness.  Musculoskeletal:     Right lower leg: 2+ Edema present.     Left lower leg: 1+ Edema present.  Lymphadenopathy:      Cervical: No cervical adenopathy.  Skin:    General: Skin is warm and dry.     Coloration: Skin is pale. Skin is not jaundiced.  Neurological:     Mental Status: She is alert.     Lab Results  Component Value Date   WBC 10.4 09/08/2019   HGB 9.2 (L) 09/08/2019   HCT 27.2 (L) 09/08/2019   PLT 327.0 09/08/2019   GLUCOSE 107 (H) 09/08/2019   CHOL 120 06/08/2019   TRIG 104.0 06/08/2019   HDL 53.90 06/08/2019   LDLDIRECT 56.0 07/08/2018   LDLCALC 45 06/08/2019   ALT 17 09/08/2019   AST 16 09/08/2019   NA 142 09/08/2019   K 5.0 09/08/2019   CL 109 09/08/2019   CREATININE 1.13 09/08/2019   BUN 20 09/08/2019   CO2 25 09/08/2019   TSH 0.797 08/31/2019   INR 1.1 07/21/2019   HGBA1C 4.8 06/08/2019    Dg Chest 2 View  Result  Date: 08/30/2019 CLINICAL DATA:  Incoherent since yesterday.  Fever. EXAM: CHEST - 2 VIEW COMPARISON:  CT 06/18/2019. Radiographs 11/13/2016. PET-CT 07/02/2019. FINDINGS: The heart size and mediastinal contours are stable. There is aortic atherosclerosis. There is new mild blunting of the left costophrenic angle laterally, suspicious for a small pleural effusion. There is no right-sided pleural effusion, pneumothorax, confluent airspace opacity or edema. Known small right upper lobe pulmonary nodule is not well seen. IMPRESSION: Possible small left pleural effusion. No other acute cardiopulmonary process. Electronically Signed   By: Richardean Sale M.D.   On: 08/30/2019 17:20    Assessment & Plan:   Jeryn was seen today for anemia.  Diagnoses and all orders for this visit:  Acute deep vein thrombosis (DVT) of calf muscle vein of left lower extremity (HCC)  Lower leg DVT (deep venous thromboembolism), acute, left ([place])- Based on her symptoms and exam this is resolving.  The clot was limited to the gastrocnemius area so anticoagulation was not indicated.  I will monitor her d-dimer and if it is rising I may consider doing another ultrasound of her left lower  extremity.  -     D-dimer, quantitative (not at [facility]); Future  Acquired hemolytic anemia ([place])- Improvement noted. -     CBC with Differential/Platelet; Future -     Hepatic function panel; Future -     Lactate dehydrogenase; Future  Iron deficiency anemia due to chronic blood loss- Her H&H have improved but she is still moderately anemic.  She is symptomatic.  I recommended that she receive a series of iron infusions. -     CBC with Differential/Platelet; Future  Stage 3b chronic kidney disease- Her renal function has improved. -     Basic metabolic panel; Future   I am having Jennette Banker maintain her vitamin C, cholecalciferol, zinc gluconate, Calcium Carb-Cholecalciferol (CALCIUM CARBONATE-VITAMIN D3 PO), pantoprazole, torsemide, multivitamin with minerals, carvedilol, traMADol, gabapentin, ferrous sulfate, and docusate sodium.  No orders of the defined types were placed in this encounter.    Follow-up: Return in about 2 months (around 11/08/2019).  [name], MD

## 2019-09-09 ENCOUNTER — Encounter: Payer: Self-pay | Admitting: Physical Therapy

## 2019-09-09 ENCOUNTER — Ambulatory Visit: Payer: Medicare Other | Admitting: Physical Therapy

## 2019-09-09 DIAGNOSIS — R600 Localized edema: Secondary | ICD-10-CM | POA: Diagnosis not present

## 2019-09-09 DIAGNOSIS — R262 Difficulty in walking, not elsewhere classified: Secondary | ICD-10-CM | POA: Diagnosis not present

## 2019-09-09 DIAGNOSIS — M25662 Stiffness of left knee, not elsewhere classified: Secondary | ICD-10-CM | POA: Diagnosis not present

## 2019-09-09 DIAGNOSIS — M25562 Pain in left knee: Secondary | ICD-10-CM | POA: Diagnosis not present

## 2019-09-09 LAB — D-DIMER, QUANTITATIVE: D-Dimer, Quant: 4.28 mcg/mL FEU — ABNORMAL HIGH (ref <0.50)

## 2019-09-09 LAB — LACTATE DEHYDROGENASE: LDH: 225 U/L (ref 120–250)

## 2019-09-09 NOTE — Therapy (Signed)
[place] [address] [place], [place], [address] Phone: [phone]   Fax:  [phone]  Physical Therapy Treatment  Patient Details  Name: Claudia Lara MRN: [id number] Date of Birth: 03-19-1932 Referring Provider (PT): Alusio   Encounter Date: 09/09/2019  PT End of Session - 09/09/19 1526    Visit Number  9    Date for PT Re-Evaluation  10/09/19    PT Start Time  1440    PT Stop Time  1526    PT Time Calculation (min)  46 min    Activity Tolerance  Patient tolerated treatment well;Patient limited by fatigue    Behavior During Therapy  [facility] for tasks assessed/performed       Past Medical History:  Diagnosis Date  . ANEMIA 08/22/2009  . Aortic stenosis   . AR (aortic regurgitation) 01/2019   Mild to Moderate, Noted on ECHO  . CAROTID ARTERY STENOSIS 01/16/2010  . Complication of anesthesia    very hard to wake up from surgery  . DYSPNEA ON EXERTION 11/16/2007  . Gallstones   . GEN OSTEOARTHROSIS INVOLVING MULTIPLE SITES 11/11/2008  . GERD (gastroesophageal reflux disease)   . Grade I diastolic dysfunction 85/2778   Noted on ECHO  . Heart murmur   . HEMORRHOIDS, INTERNAL    surgery was in the 90's (per patient)  . History of blood transfusion    after hip and knee replacements  . NECK PAIN, ACUTE 12/28/2009  . PERIPHERAL EDEMA 10/06/2007  . PONV (postoperative nausea and vomiting)   . Pulmonary nodule 06/18/2019   noted on CT Chest   . PVD 10/06/2007  . Unspecified essential hypertension 10/19/2007    Past Surgical History:  Procedure Laterality Date  . ABDOMINAL HYSTERECTOMY    . BIOPSY  08/06/2019   Procedure: BIOPSY;  Surgeon: Gatha Mayer, MD;  Location: [facility];  Service: Gastroenterology;;  . [name]     bilat  . CARPAL TUNNEL RELEASE    . CHOLECYSTECTOMY  01/29/2016   at [facility]  . CHOLECYSTECTOMY    . ENDOSCOPIC RETROGRADE CHOLANGIOPANCREATOGRAPHY (ERCP)  WITH PROPOFOL N/A 12/01/2015   Procedure: ENDOSCOPIC RETROGRADE CHOLANGIOPANCREATOGRAPHY (ERCP) WITH PROPOFOL;  Surgeon: [name], MD;  Location: [facility];  Service: Endoscopy;  Laterality: N/A;  . ENDOSCOPIC RETROGRADE CHOLANGIOPANCREATOGRAPHY (ERCP) WITH PROPOFOL N/A 02/01/2016   Procedure: ENDOSCOPIC RETROGRADE CHOLANGIOPANCREATOGRAPHY (ERCP) WITH PROPOFOL;  Surgeon: [name], MD;  Location: [facility];  Service: Endoscopy;  Laterality: N/A;  . ENDOSCOPIC RETROGRADE CHOLANGIOPANCREATOGRAPHY (ERCP) WITH PROPOFOL N/A 04/07/2018   Procedure: ENDOSCOPIC RETROGRADE CHOLANGIOPANCREATOGRAPHY (ERCP) WITH PROPOFOL;  Surgeon: [name], MD;  Location: [facility];  Service: Endoscopy;  Laterality: N/A;  . ESOPHAGOGASTRODUODENOSCOPY (EGD) WITH PROPOFOL N/A 08/06/2019   Procedure: ESOPHAGOGASTRODUODENOSCOPY (EGD) WITH PROPOFOL;  Surgeon: Gatha Mayer, MD;  Location: [facility];  Service: Gastroenterology;  Laterality: N/A;  . HAMMER TOE SURGERY    . HEMORRHOID SURGERY    . Hyperplastic colon polyps, removed  2007   By Dr. Penelope Coop  . JOINT REPLACEMENT  2011   right knee  . OOPHORECTOMY    . REMOVAL OF STONES  04/07/2018   Procedure: REMOVAL OF STONES;  Surgeon: [name], MD;  Location: [facility];  Service: Endoscopy;;  . ROTATOR CUFF REPAIR  2004   left (Dr. Durward Fortes)  . SPHINCTEROTOMY  04/07/2018   Procedure: SPHINCTEROTOMY;  Surgeon: [name], MD;  Location: [facility];  Service: Endoscopy;;  .  TOTAL HIP ARTHROPLASTY Right 11/26/2016   Procedure: RIGHT TOTAL HIP ARTHROPLASTY;  Surgeon: Garald Balding, MD;  Location: [facility];  Service: Orthopedics;  Laterality: Right;  . TOTAL KNEE ARTHROPLASTY Right   . TOTAL KNEE ARTHROPLASTY Left 07/26/2019   Procedure: TOTAL KNEE ARTHROPLASTY;  Surgeon: Gaynelle Arabian, MD;  Location: [facility];  Service: Orthopedics;  Laterality: Left;  50mn    There were no vitals filed for this visit.  Subjective Assessment -  09/09/19 1443    Subjective  Patient comes in on a walker, still c/o slow and stiff    Currently in Pain?  Yes    Pain Score  4     Pain Location  Knee                       [facility] Adult PT Treatment/Exercise - 09/09/19 0001      Ambulation/Gait   Gait Comments  gait in hall 80' x 2 with SPC and slow , cues for sequencing at times, tried to walk out with the cane and she had to rest twice as she became fatigued      Knee/Hip Exercises: Aerobic   Recumbent Bike  bike partial revolutions x 6 minutes, was able to go around at the end with seat position #6    Nustep  L 3 7 min      Knee/Hip Exercises: Machines for Strengthening   Cybex Knee Flexion  20# 3x10      Knee/Hip Exercises: Seated   Long Arc Quad  20 reps;Left    Long Arc Quad Limitations  3#    Heel Slides  Left;2 sets;10 reps    Heel Slides Limitations  working on more flexion and having her hold 25 sedonds      Manual Therapy   Manual Therapy  Passive ROM;Soft tissue mobilization    Passive ROM  PROM of the left knee flexion and extnension               PT Short Term Goals - 08/25/19 1011      PT SHORT TERM GOAL #1   Title  independent with initial HEP        PT Long Term Goals - 09/06/19 1533      PT LONG TERM GOAL #1   Title  walk 200 feet with SPC or no device    Status  On-going      PT LONG TERM GOAL #2   Title  go up her steps step over step    Status  On-going      PT LONG TERM GOAL #3   Title  increase AROM of the left knee to 5-115 degrees flexion    Status  On-going      PT LONG TERM GOAL #4   Title  decrease pain 50%    Status  Partially Met      PT LONG TERM GOAL #5   Title  decrease TUG to 16 seconds    Status  On-going            Plan - 09/09/19 1527    Clinical Impression Statement  Patient with a lot of swellin in the left lower leg, she saw the MD yestreday and the swelling was there prior to the knee surgery.  She is definitely still not as good as  prior to her recent hospitalization with ROM and feeling stable walking,    PT Next Visit Plan  try to  regain her ROM and her stability while walking    Consulted and Agree with Plan of Care  Patient       Patient will benefit from skilled therapeutic intervention in order to improve the following deficits and impairments:  Abnormal gait, Pain, Decreased scar mobility, Decreased mobility, Cardiopulmonary status limiting activity, Decreased activity tolerance, Decreased endurance, Decreased range of motion, Decreased strength, Increased edema, Difficulty walking, Decreased balance  Visit Diagnosis: Stiffness of left knee, not elsewhere classified  Acute pain of left knee  Difficulty in walking, not elsewhere classified  Localized edema     Problem List Patient Active Problem List   Diagnosis Date Noted  . Iron deficiency anemia due to chronic blood loss 09/08/2019  . Interstitial pulmonary disease ([place]) 09/08/2019  . Anemia 08/31/2019  . Lower leg DVT (deep venous thromboembolism), acute, left ([place]) 08/18/2019  . Hypotension due to hypovolemia 08/04/2019  . OA (osteoarthritis) of knee 07/26/2019  . Lung nodule 06/24/2019  . Bilateral leg edema 06/08/2019  . Zinc deficiency 10/03/2017  . Spondylosis, lumbar, with myelopathy 04/09/2017  . Hyperglycemia 03/05/2016  . GERD (gastroesophageal reflux disease) 08/03/2015  . Hemolytic anemia ([place]) 07/12/2015  . Chronic venous insufficiency 08/10/2014  . Aortic stenosis, mild 06/29/2014  . Left ventricular diastolic dysfunction, NYHA class 1 06/29/2014  . Senile osteopenia 04/21/2014  . Hyperlipidemia with target LDL less than 130 04/21/2014  . Kidney disease, chronic, stage III (GFR 30-59 ml/min) ([place]) 04/21/2014  . Routine health maintenance 07/16/2012  . Carotid artery stenosis without cerebral infarction 01/16/2010  . Essential hypertension 10/19/2007    Sumner Boast., PT 09/09/2019, 3:29 PM  [address] [place], [place], [address] Phone: [phone]   Fax:  [phone]  Name: Claudia Lara MRN: [id number] Date of Birth: [birth year]

## 2019-09-13 ENCOUNTER — Ambulatory Visit: Payer: Medicare Other | Admitting: Physical Therapy

## 2019-09-13 ENCOUNTER — Encounter: Payer: Self-pay | Admitting: Physical Therapy

## 2019-09-13 ENCOUNTER — Other Ambulatory Visit: Payer: Self-pay

## 2019-09-13 DIAGNOSIS — M25662 Stiffness of left knee, not elsewhere classified: Secondary | ICD-10-CM

## 2019-09-13 DIAGNOSIS — R600 Localized edema: Secondary | ICD-10-CM | POA: Diagnosis not present

## 2019-09-13 DIAGNOSIS — M25562 Pain in left knee: Secondary | ICD-10-CM | POA: Diagnosis not present

## 2019-09-13 DIAGNOSIS — R262 Difficulty in walking, not elsewhere classified: Secondary | ICD-10-CM

## 2019-09-13 NOTE — Therapy (Signed)
[address] [place], [place], [address] Phone: [phone]   Fax:  [phone] Progress Note Reporting Period 08/09/19 to 09/13/19 for the first 10 visits  See note below for Objective Data and Assessment of Progress/Goals.      Physical Therapy Treatment  Patient Details  Name: Claudia Lara MRN: [id number] Date of Birth: January 19, 1932 Referring Provider (PT): Alusio   Encounter Date: 09/13/2019  PT End of Session - 09/13/19 1515    Visit Number  10    Date for PT Re-Evaluation  10/09/19    PT Start Time  1436    PT Stop Time  1517    PT Time Calculation (min)  41 min    Activity Tolerance  Patient tolerated treatment well;Patient limited by fatigue;Patient limited by pain    Behavior During Therapy  [facility] for tasks assessed/performed       Past Medical History:  Diagnosis Date  . ANEMIA 08/22/2009  . Aortic stenosis   . AR (aortic regurgitation) 01/2019   Mild to Moderate, Noted on ECHO  . CAROTID ARTERY STENOSIS 01/16/2010  . Complication of anesthesia    very hard to wake up from surgery  . DYSPNEA ON EXERTION 11/16/2007  . Gallstones   . GEN OSTEOARTHROSIS INVOLVING MULTIPLE SITES 11/11/2008  . GERD (gastroesophageal reflux disease)   . Grade I diastolic dysfunction 123456   Noted on ECHO  . Heart murmur   . HEMORRHOIDS, INTERNAL    surgery was in the 90's (per patient)  . History of blood transfusion    after hip and knee replacements  . NECK PAIN, ACUTE 12/28/2009  . PERIPHERAL EDEMA 10/06/2007  . PONV (postoperative nausea and vomiting)   . Pulmonary nodule 06/18/2019   noted on CT Chest   . PVD 10/06/2007  . Unspecified essential hypertension 10/19/2007    Past Surgical History:  Procedure Laterality Date  . ABDOMINAL HYSTERECTOMY    . BIOPSY  08/06/2019   Procedure: BIOPSY;  Surgeon: Gatha Mayer, MD;  Location: [facility];  Service: Gastroenterology;;  . [name]      bilat  . CARPAL TUNNEL RELEASE    . CHOLECYSTECTOMY  01/29/2016   at [facility]  . CHOLECYSTECTOMY    . ENDOSCOPIC RETROGRADE CHOLANGIOPANCREATOGRAPHY (ERCP) WITH PROPOFOL N/A 12/01/2015   Procedure: ENDOSCOPIC RETROGRADE CHOLANGIOPANCREATOGRAPHY (ERCP) WITH PROPOFOL;  Surgeon: [name], MD;  [facility];  Service: Endoscopy;  Laterality: N/A;  . ENDOSCOPIC RETROGRADE CHOLANGIOPANCREATOGRAPHY (ERCP) WITH PROPOFOL N/A 02/01/2016   Procedure: ENDOSCOPIC RETROGRADE CHOLANGIOPANCREATOGRAPHY (ERCP) WITH PROPOFOL;  Surgeon: [name], MD;  [facility];  Service: Endoscopy;  Laterality: N/A;  . ENDOSCOPIC RETROGRADE CHOLANGIOPANCREATOGRAPHY (ERCP) WITH PROPOFOL N/A 04/07/2018   Procedure: ENDOSCOPIC RETROGRADE CHOLANGIOPANCREATOGRAPHY (ERCP) WITH PROPOFOL;  Surgeon: [name], MD;  [facility];  Service: Endoscopy;  Laterality: N/A;  . ESOPHAGOGASTRODUODENOSCOPY (EGD) WITH PROPOFOL N/A 08/06/2019   Procedure: ESOPHAGOGASTRODUODENOSCOPY (EGD) WITH PROPOFOL;  Surgeon: Gatha Mayer, MD;  [facility];  Service: Gastroenterology;  Laterality: N/A;  . HAMMER TOE SURGERY    . HEMORRHOID SURGERY    . Hyperplastic colon polyps, removed  2007   By Dr. Penelope Coop  . JOINT REPLACEMENT  2011   right knee  . OOPHORECTOMY    . REMOVAL OF STONES  04/07/2018   Procedure: REMOVAL OF STONES;  Surgeon: [name], MD;  [facility];  Service: Endoscopy;;  . ROTATOR CUFF REPAIR  2004   left (Dr. Durward Fortes)  . SPHINCTEROTOMY  04/07/2018   Procedure: SPHINCTEROTOMY;  Surgeon: [name], MD;  Location: [facility];  Service: Endoscopy;;  . TOTAL HIP ARTHROPLASTY Right 11/26/2016   Procedure: RIGHT TOTAL HIP ARTHROPLASTY;  Surgeon: Garald Balding, MD;  Location: [facility];  Service: Orthopedics;  Laterality: Right;  . TOTAL KNEE ARTHROPLASTY Right   . TOTAL KNEE ARTHROPLASTY Left 07/26/2019   Procedure: TOTAL KNEE  ARTHROPLASTY;  Surgeon: Gaynelle Arabian, MD;  [facility];  Service: Orthopedics;  Laterality: Left;  17min    There were no vitals filed for this visit.  Subjective Assessment - 09/13/19 1442    Subjective  Patient reports that she is using the cane again today but did use the walker over the weekend.  She c/o stiffness and pain    Currently in Pain?  Yes    Pain Score  5     Pain Location  Knee    Pain Orientation  Left    Aggravating Factors   just really stiff after the hospital         [facility] PT Assessment - 09/13/19 0001      AROM   Left Knee Flexion  100      PROM   Left Knee Flexion  108                   [facility] Adult PT Treatment/Exercise - 09/13/19 0001      Ambulation/Gait   Gait Comments  gait in hall no device, gets fatigued and has to rest about every 60 feet - 80 feet      Knee/Hip Exercises: Aerobic   Recumbent Bike  bike partial revolutions x 6 minutes, was able to go around at the end with seat position #6    Nustep  L 3 7 min      Knee/Hip Exercises: Machines for Strengthening   Cybex Knee Flexion  20# 2x10, then no weight working on more flexion      Knee/Hip Exercises: Seated   Long [employer]  20 reps;Left    Long Arc Quad Limitations  3#    Heel Slides  Left;2 sets;10 reps    Heel Slides Limitations  working on more flexion and having her hold 25 sedonds      Manual Therapy   Manual Therapy  Passive ROM;Soft tissue mobilization    Soft tissue mobilization  scar mobilization    Passive ROM  PROM of the left knee flexion and extnension               PT Short Term Goals - 08/25/19 1011      PT SHORT TERM GOAL #1   Title  independent with initial HEP        PT Long Term Goals - 09/13/19 1517      PT LONG TERM GOAL #1   Title  walk 200 feet with SPC or no device    Status  On-going      PT LONG TERM GOAL #2   Title  go up her steps step over step    Status  On-going      PT LONG TERM GOAL #3   Title  increase  AROM of the left knee to 5-115 degrees flexion    Status  On-going            Plan - 09/13/19 1516    Clinical Impression Statement  Patient has had a set  back from the hospitalization, she is stiff and she gets fatigued easily, her ROM is a little better but again still very stiff, the swelling inthe knee is significant but it was like that prior to surgery she says, she is limited in walking 60-80 feet before needing to rest    PT Next Visit Plan  try to regain her ROM and her stability while walking    Consulted and Agree with Plan of Care  Patient       Patient will benefit from skilled therapeutic intervention in order to improve the following deficits and impairments:  Abnormal gait, Pain, Decreased scar mobility, Decreased mobility, Cardiopulmonary status limiting activity, Decreased activity tolerance, Decreased endurance, Decreased range of motion, Decreased strength, Increased edema, Difficulty walking, Decreased balance  Visit Diagnosis: Stiffness of left knee, not elsewhere classified  Acute pain of left knee  Difficulty in walking, not elsewhere classified  Localized edema     Problem List Patient Active Problem List   Diagnosis Date Noted  . Iron deficiency anemia due to chronic blood loss 09/08/2019  . Interstitial pulmonary disease ([place]) 09/08/2019  . Anemia 08/31/2019  . Lower leg DVT (deep venous thromboembolism), acute, left ([place]) 08/18/2019  . Hypotension due to hypovolemia 08/04/2019  . OA (osteoarthritis) of knee 07/26/2019  . Lung nodule 06/24/2019  . Bilateral leg edema 06/08/2019  . Zinc deficiency 10/03/2017  . Spondylosis, lumbar, with myelopathy 04/09/2017  . Hyperglycemia 03/05/2016  . GERD (gastroesophageal reflux disease) 08/03/2015  . Hemolytic anemia ([place]) 07/12/2015  . Chronic venous insufficiency 08/10/2014  . Aortic stenosis, mild 06/29/2014  . Left ventricular diastolic dysfunction, NYHA class 1 06/29/2014  . Senile osteopenia  04/21/2014  . Hyperlipidemia with target LDL less than 130 04/21/2014  . Kidney disease, chronic, stage III (GFR 30-59 ml/min) ([place]) 04/21/2014  . Routine health maintenance 07/16/2012  . Carotid artery stenosis without cerebral infarction 01/16/2010  . Essential hypertension 10/19/2007    Sumner Boast., PT 09/13/2019, 3:18 PM  [address] [place], [place], [address] Phone: [phone]   Fax:  [phone]  Name: HATSUMI TRAUGHBER MRN: [id number] Date of Birth: [birth year]

## 2019-09-16 ENCOUNTER — Other Ambulatory Visit: Payer: Self-pay

## 2019-09-16 ENCOUNTER — Ambulatory Visit: Payer: Medicare Other | Admitting: Physical Therapy

## 2019-09-16 DIAGNOSIS — M25662 Stiffness of left knee, not elsewhere classified: Secondary | ICD-10-CM

## 2019-09-16 DIAGNOSIS — R600 Localized edema: Secondary | ICD-10-CM | POA: Diagnosis not present

## 2019-09-16 DIAGNOSIS — R262 Difficulty in walking, not elsewhere classified: Secondary | ICD-10-CM | POA: Diagnosis not present

## 2019-09-16 DIAGNOSIS — M25562 Pain in left knee: Secondary | ICD-10-CM | POA: Diagnosis not present

## 2019-09-16 NOTE — Therapy (Signed)
[address] [place], [place], [address] Phone: [phone]   Fax:  [phone]  Physical Therapy Treatment  Patient Details  Name: Claudia Lara MRN: [id number] Date of Birth: 04/20/1932 Referring Provider (PT): Alusio   Encounter Date: 09/16/2019  PT End of Session - 09/16/19 1530    Visit Number  11    Date for PT Re-Evaluation  10/09/19    PT Start Time  [id number]    PT Stop Time  [id number]    PT Time Calculation (min)  45 min       Past Medical History:  Diagnosis Date  . ANEMIA 08/22/2009  . Aortic stenosis   . AR (aortic regurgitation) 01/2019   Mild to Moderate, Noted on ECHO  . CAROTID ARTERY STENOSIS 01/16/2010  . Complication of anesthesia    very hard to wake up from surgery  . DYSPNEA ON EXERTION 11/16/2007  . Gallstones   . GEN OSTEOARTHROSIS INVOLVING MULTIPLE SITES 11/11/2008  . GERD (gastroesophageal reflux disease)   . Grade I diastolic dysfunction 123456   Noted on ECHO  . Heart murmur   . HEMORRHOIDS, INTERNAL    surgery was in the 90's (per patient)  . History of blood transfusion    after hip and knee replacements  . NECK PAIN, ACUTE 12/28/2009  . PERIPHERAL EDEMA 10/06/2007  . PONV (postoperative nausea and vomiting)   . Pulmonary nodule 06/18/2019   noted on CT Chest   . PVD 10/06/2007  . Unspecified essential hypertension 10/19/2007    Past Surgical History:  Procedure Laterality Date  . ABDOMINAL HYSTERECTOMY    . BIOPSY  08/06/2019   Procedure: BIOPSY;  Surgeon: Gatha Mayer, MD;  Location: [facility];  Service: Gastroenterology;;  . [name]     bilat  . CARPAL TUNNEL RELEASE    . CHOLECYSTECTOMY  01/29/2016   at [facility]  . CHOLECYSTECTOMY    . ENDOSCOPIC RETROGRADE CHOLANGIOPANCREATOGRAPHY (ERCP) WITH PROPOFOL N/A 12/01/2015   Procedure: ENDOSCOPIC RETROGRADE CHOLANGIOPANCREATOGRAPHY (ERCP) WITH PROPOFOL;  Surgeon: [name], MD;   [facility];  Service: Endoscopy;  Laterality: N/A;  . ENDOSCOPIC RETROGRADE CHOLANGIOPANCREATOGRAPHY (ERCP) WITH PROPOFOL N/A 02/01/2016   Procedure: ENDOSCOPIC RETROGRADE CHOLANGIOPANCREATOGRAPHY (ERCP) WITH PROPOFOL;  Surgeon: [name], MD;  [facility];  Service: Endoscopy;  Laterality: N/A;  . ENDOSCOPIC RETROGRADE CHOLANGIOPANCREATOGRAPHY (ERCP) WITH PROPOFOL N/A 04/07/2018   Procedure: ENDOSCOPIC RETROGRADE CHOLANGIOPANCREATOGRAPHY (ERCP) WITH PROPOFOL;  Surgeon: [name], MD;  [facility];  Service: Endoscopy;  Laterality: N/A;  . ESOPHAGOGASTRODUODENOSCOPY (EGD) WITH PROPOFOL N/A 08/06/2019   Procedure: ESOPHAGOGASTRODUODENOSCOPY (EGD) WITH PROPOFOL;  Surgeon: Gatha Mayer, MD;  [facility];  Service: Gastroenterology;  Laterality: N/A;  . HAMMER TOE SURGERY    . HEMORRHOID SURGERY    . Hyperplastic colon polyps, removed  2007   By Dr. Penelope Coop  . JOINT REPLACEMENT  2011   right knee  . OOPHORECTOMY    . REMOVAL OF STONES  04/07/2018   Procedure: REMOVAL OF STONES;  Surgeon: [name], MD;  [facility];  Service: Endoscopy;;  . ROTATOR CUFF REPAIR  2004   left (Dr. Durward Fortes)  . SPHINCTEROTOMY  04/07/2018   Procedure: SPHINCTEROTOMY;  Surgeon: [name], MD;  Location: [facility];  Service: Endoscopy;;  . TOTAL HIP ARTHROPLASTY Right 11/26/2016   Procedure: RIGHT TOTAL HIP ARTHROPLASTY;  Surgeon: Garald Balding, MD;  Location: [facility];  Service: Orthopedics;  Laterality: Right;  . TOTAL KNEE ARTHROPLASTY Right   . TOTAL KNEE ARTHROPLASTY Left 07/26/2019   Procedure: TOTAL KNEE ARTHROPLASTY;  Surgeon: Gaynelle Arabian, MD;  [facility];  Service: Orthopedics;  Laterality: Left;  53min    There were no vitals filed for this visit.  Subjective Assessment - 09/16/19 1442    Subjective  amb in with [facility] stating she wants to be able ot walk without anything    Pain Score  3     Pain Location  Knee     Pain Orientation  Left         [facility] PT Assessment - 09/16/19 0001      AROM   Left Knee Flexion  105   after MT seated                  [facility] Adult PT Treatment/Exercise - 09/16/19 0001      Ambulation/Gait   Gait Comments  gait without AD 100 feet, SBA with narrow BOS and slight antalgic gait      Knee/Hip Exercises: Aerobic   Recumbent Bike  5 min , seat # 5 full rev    Nustep  L 4 6 min LE only      Knee/Hip Exercises: Machines for Strengthening   Cybex Knee Extension  5# 2x10    Cybex Knee Flexion  20# 2 sets 10    Cybex Leg Press  20# 2 sets 10, then 10 no wt working to increase flexion      Manual Therapy   Manual Therapy  Passive ROM    Passive ROM  PROM of the left knee flexion and extnension               PT Short Term Goals - 08/25/19 1011      PT SHORT TERM GOAL #1   Title  independent with initial HEP        PT Long Term Goals - 09/13/19 1517      PT LONG TERM GOAL #1   Title  walk 200 feet with SPC or no device    Status  On-going      PT LONG TERM GOAL #2   Title  go up her steps step over step    Status  On-going      PT LONG TERM GOAL #3   Title  increase AROM of the left knee to 5-115 degrees flexion    Status  On-going            Plan - 09/16/19 1531    Clinical Impression Statement  pt able to do full rev on bike seat #5. increased AROM after manual stretching. increased wt nmachines for strength. Gait without AD with narrow BOS and slight antalgic gait    PT Treatment/Interventions  ADLs/Self Care Home Management;Electrical Stimulation;Cryotherapy;Ultrasound;Therapeutic activities;Therapeutic exercise;Patient/family education;Manual techniques;Stair training;Gait training;Functional mobility training;Balance training;Vasopneumatic Device    PT Next Visit Plan  try to regain her ROM and her stability while walking ( try gait without AD longer distance and possibly outside)       Patient will benefit from  skilled therapeutic intervention in order to improve the following deficits and impairments:  Abnormal gait, Pain, Decreased scar mobility, Decreased mobility, Cardiopulmonary status limiting activity, Decreased activity tolerance, Decreased endurance, Decreased range of motion, Decreased strength, Increased edema, Difficulty walking, Decreased balance  Visit Diagnosis: Stiffness of left knee, not elsewhere classified  Difficulty in walking, not elsewhere classified  Acute  pain of left knee     Problem List Patient Active Problem List   Diagnosis Date Noted  . Iron deficiency anemia due to chronic blood loss 09/08/2019  . Interstitial pulmonary disease ([place]) 09/08/2019  . Anemia 08/31/2019  . Lower leg DVT (deep venous thromboembolism), acute, left ([place]) 08/18/2019  . Hypotension due to hypovolemia 08/04/2019  . OA (osteoarthritis) of knee 07/26/2019  . Lung nodule 06/24/2019  . Bilateral leg edema 06/08/2019  . Zinc deficiency 10/03/2017  . Spondylosis, lumbar, with myelopathy 04/09/2017  . Hyperglycemia 03/05/2016  . GERD (gastroesophageal reflux disease) 08/03/2015  . Hemolytic anemia ([place]) 07/12/2015  . Chronic venous insufficiency 08/10/2014  . Aortic stenosis, mild 06/29/2014  . Left ventricular diastolic dysfunction, NYHA class 1 06/29/2014  . Senile osteopenia 04/21/2014  . Hyperlipidemia with target LDL less than 130 04/21/2014  . Kidney disease, chronic, stage III (GFR 30-59 ml/min) ([place]) 04/21/2014  . Routine health maintenance 07/16/2012  . Carotid artery stenosis without cerebral infarction 01/16/2010  . Essential hypertension 10/19/2007    David Rodriquez,ANGIE PTA 09/16/2019, 3:33 PM  [address] [place], [place], [address] Phone: [phone]   Fax:  [phone]  Name: Claudia Lara MRN: [id number] Date of Birth: [birth year]

## 2019-09-20 ENCOUNTER — Other Ambulatory Visit: Payer: Self-pay

## 2019-09-20 ENCOUNTER — Encounter: Payer: Self-pay | Admitting: Physical Therapy

## 2019-09-20 ENCOUNTER — Ambulatory Visit: Payer: Medicare Other | Admitting: Physical Therapy

## 2019-09-20 DIAGNOSIS — M25662 Stiffness of left knee, not elsewhere classified: Secondary | ICD-10-CM

## 2019-09-20 DIAGNOSIS — R262 Difficulty in walking, not elsewhere classified: Secondary | ICD-10-CM

## 2019-09-20 DIAGNOSIS — M25562 Pain in left knee: Secondary | ICD-10-CM

## 2019-09-20 DIAGNOSIS — R600 Localized edema: Secondary | ICD-10-CM | POA: Diagnosis not present

## 2019-09-20 NOTE — Therapy (Signed)
[address] [place], [place], [address] Phone: [phone]   Fax:  [phone]  Physical Therapy Treatment  Patient Details  Name: Claudia Lara MRN: [id number] Date of Birth: 1932/06/08 Referring Provider (PT): Alusio   Encounter Date: 09/20/2019  PT End of Session - 09/20/19 1505    Visit Number  12    Date for PT Re-Evaluation  10/09/19    Authorization Type  MCR and KX at visit 10    PT Start Time  1435    PT Stop Time  1520    PT Time Calculation (min)  45 min    Activity Tolerance  Patient tolerated treatment well;Patient limited by fatigue    Behavior During Therapy  [facility] for tasks assessed/performed       Past Medical History:  Diagnosis Date  . ANEMIA 08/22/2009  . Aortic stenosis   . AR (aortic regurgitation) 01/2019   Mild to Moderate, Noted on ECHO  . CAROTID ARTERY STENOSIS 01/16/2010  . Complication of anesthesia    very hard to wake up from surgery  . DYSPNEA ON EXERTION 11/16/2007  . Gallstones   . GEN OSTEOARTHROSIS INVOLVING MULTIPLE SITES 11/11/2008  . GERD (gastroesophageal reflux disease)   . Grade I diastolic dysfunction 53/2992   Noted on ECHO  . Heart murmur   . HEMORRHOIDS, INTERNAL    surgery was in the 90's (per patient)  . History of blood transfusion    after hip and knee replacements  . NECK PAIN, ACUTE 12/28/2009  . PERIPHERAL EDEMA 10/06/2007  . PONV (postoperative nausea and vomiting)   . Pulmonary nodule 06/18/2019   noted on CT Chest   . PVD 10/06/2007  . Unspecified essential hypertension 10/19/2007    Past Surgical History:  Procedure Laterality Date  . ABDOMINAL HYSTERECTOMY    . BIOPSY  08/06/2019   Procedure: BIOPSY;  Surgeon: Gatha Mayer, MD;  Location: [facility];  Service: Gastroenterology;;  . [name]     bilat  . CARPAL TUNNEL RELEASE    . CHOLECYSTECTOMY  01/29/2016   at [facility]  . CHOLECYSTECTOMY    .  ENDOSCOPIC RETROGRADE CHOLANGIOPANCREATOGRAPHY (ERCP) WITH PROPOFOL N/A 12/01/2015   Procedure: ENDOSCOPIC RETROGRADE CHOLANGIOPANCREATOGRAPHY (ERCP) WITH PROPOFOL;  Surgeon: [name], MD;  [facility];  Service: Endoscopy;  Laterality: N/A;  . ENDOSCOPIC RETROGRADE CHOLANGIOPANCREATOGRAPHY (ERCP) WITH PROPOFOL N/A 02/01/2016   Procedure: ENDOSCOPIC RETROGRADE CHOLANGIOPANCREATOGRAPHY (ERCP) WITH PROPOFOL;  Surgeon: [name], MD;  [facility];  Service: Endoscopy;  Laterality: N/A;  . ENDOSCOPIC RETROGRADE CHOLANGIOPANCREATOGRAPHY (ERCP) WITH PROPOFOL N/A 04/07/2018   Procedure: ENDOSCOPIC RETROGRADE CHOLANGIOPANCREATOGRAPHY (ERCP) WITH PROPOFOL;  Surgeon: [name], MD;  [facility];  Service: Endoscopy;  Laterality: N/A;  . ESOPHAGOGASTRODUODENOSCOPY (EGD) WITH PROPOFOL N/A 08/06/2019   Procedure: ESOPHAGOGASTRODUODENOSCOPY (EGD) WITH PROPOFOL;  Surgeon: Gatha Mayer, MD;  [facility];  Service: Gastroenterology;  Laterality: N/A;  . HAMMER TOE SURGERY    . HEMORRHOID SURGERY    . Hyperplastic colon polyps, removed  2007   By Dr. Penelope Coop  . JOINT REPLACEMENT  2011   right knee  . OOPHORECTOMY    . REMOVAL OF STONES  04/07/2018   Procedure: REMOVAL OF STONES;  Surgeon: [name], MD;  [facility];  Service: Endoscopy;;  . ROTATOR CUFF REPAIR  2004   left (Dr. Durward Fortes)  . SPHINCTEROTOMY  04/07/2018   Procedure: SPHINCTEROTOMY;  Surgeon:  [name], MD;  Location[facility];  Service: Endoscopy;;  . TOTAL HIP ARTHROPLASTY Right 11/26/2016   Procedure: RIGHT TOTAL HIP ARTHROPLASTY;  Surgeon: Garald Balding, MD;  Location: [facility];  Service: Orthopedics;  Laterality: Right;  . TOTAL KNEE ARTHROPLASTY Right   . TOTAL KNEE ARTHROPLASTY Left 07/26/2019   Procedure: TOTAL KNEE ARTHROPLASTY;  Surgeon: Gaynelle Arabian, MD;  [facility];  Service: Orthopedics;  Laterality: Left;  60mn    There were no vitals  filed for this visit.  Subjective Assessment - 09/20/19 1443    Subjective  Patient reports that she is still having difficulty with anemia, will have a "blood infusion" next week    Currently in Pain?  Yes    Pain Score  2     Pain Location  Knee    Pain Orientation  Left    Aggravating Factors   bending the knee         [facility] PT Assessment - 09/20/19 0001      PROM   Left Knee Flexion  110                   [facility] Adult PT Treatment/Exercise - 09/20/19 0001      Ambulation/Gait   Gait Comments  gait without device, some curb negotiation and slope      Knee/Hip Exercises: Aerobic   Recumbent Bike  5 min , seat # 5 full rev    Nustep  L 4 6 min LE only      Knee/Hip Exercises: Machines for Strengthening   Cybex Knee Extension  5# 2x10    Cybex Knee Flexion  20# 2 sets 10    Cybex Leg Press  20# 2 sets 10, then 10 no wt working to increase flexion      Manual Therapy   Manual Therapy  Passive ROM    Soft tissue mobilization  scar mobilization    Passive ROM  PROM of the left knee flexion and extnension               PT Short Term Goals - 08/25/19 1011      PT SHORT TERM GOAL #1   Title  independent with initial HEP        PT Long Term Goals - 09/20/19 1508      PT LONG TERM GOAL #1   Title  walk 200 feet with SPC or no device    Status  On-going      PT LONG TERM GOAL #2   Title  go up her steps step over step      PT LONG TERM GOAL #3   Title  increase AROM of the left knee to 5-115 degrees flexion    Status  Partially Met            Plan - 09/20/19 1506    Clinical Impression Statement  Patient with some increased PROM, she is still having issues with anemia and will have an "infusion" next week.  She needs some help with the PROM and getting to end ranges, but when she gets there she will stop me and say that is enough.    PT Next Visit Plan  working on gait without device but at this point I think she is safer with the cane     Consulted and Agree with Plan of Care  Patient       Patient will benefit from skilled therapeutic intervention in order  to improve the following deficits and impairments:  Abnormal gait, Pain, Decreased scar mobility, Decreased mobility, Cardiopulmonary status limiting activity, Decreased activity tolerance, Decreased endurance, Decreased range of motion, Decreased strength, Increased edema, Difficulty walking, Decreased balance  Visit Diagnosis: Stiffness of left knee, not elsewhere classified  Difficulty in walking, not elsewhere classified  Acute pain of left knee  Localized edema     Problem List Patient Active Problem List   Diagnosis Date Noted  . Iron deficiency anemia due to chronic blood loss 09/08/2019  . Interstitial pulmonary disease ([place]) 09/08/2019  . Anemia 08/31/2019  . Lower leg DVT (deep venous thromboembolism), acute, left ([place]) 08/18/2019  . Hypotension due to hypovolemia 08/04/2019  . OA (osteoarthritis) of knee 07/26/2019  . Lung nodule 06/24/2019  . Bilateral leg edema 06/08/2019  . Zinc deficiency 10/03/2017  . Spondylosis, lumbar, with myelopathy 04/09/2017  . Hyperglycemia 03/05/2016  . GERD (gastroesophageal reflux disease) 08/03/2015  . Hemolytic anemia ([place]) 07/12/2015  . Chronic venous insufficiency 08/10/2014  . Aortic stenosis, mild 06/29/2014  . Left ventricular diastolic dysfunction, NYHA class 1 06/29/2014  . Senile osteopenia 04/21/2014  . Hyperlipidemia with target LDL less than 130 04/21/2014  . Kidney disease, chronic, stage III (GFR 30-59 ml/min) ([place]) 04/21/2014  . Routine health maintenance 07/16/2012  . Carotid artery stenosis without cerebral infarction 01/16/2010  . Essential hypertension 10/19/2007    Sumner Boast., PT 09/20/2019, 3:16 PM  [address] [place], [place], [address] Phone: [phone]   Fax:  [phone]  Name: CAPUCINE TRYON MRN: [id number] Date of Birth: 11/30/1931

## 2019-09-21 ENCOUNTER — Encounter: Payer: Self-pay | Admitting: Internal Medicine

## 2019-09-21 ENCOUNTER — Ambulatory Visit (INDEPENDENT_AMBULATORY_CARE_PROVIDER_SITE_OTHER): Payer: Medicare Other | Admitting: Internal Medicine

## 2019-09-21 ENCOUNTER — Other Ambulatory Visit (INDEPENDENT_AMBULATORY_CARE_PROVIDER_SITE_OTHER): Payer: Medicare Other

## 2019-09-21 VITALS — BP 140/86 | HR 90 | Temp 98.5°F | Ht 62.9 in | Wt 163.0 lb

## 2019-09-21 DIAGNOSIS — R399 Unspecified symptoms and signs involving the genitourinary system: Secondary | ICD-10-CM

## 2019-09-21 DIAGNOSIS — R300 Dysuria: Secondary | ICD-10-CM

## 2019-09-21 DIAGNOSIS — R739 Hyperglycemia, unspecified: Secondary | ICD-10-CM

## 2019-09-21 DIAGNOSIS — I10 Essential (primary) hypertension: Secondary | ICD-10-CM | POA: Diagnosis not present

## 2019-09-21 LAB — URINALYSIS, ROUTINE W REFLEX MICROSCOPIC
Bilirubin Urine: NEGATIVE
Hgb urine dipstick: NEGATIVE
Ketones, ur: NEGATIVE
Nitrite: NEGATIVE
RBC / HPF: NONE SEEN (ref 0–?)
Specific Gravity, Urine: 1.015 (ref 1.000–1.030)
Urine Glucose: NEGATIVE
Urobilinogen, UA: 0.2 (ref 0.0–1.0)
pH: 6.5 (ref 5.0–8.0)

## 2019-09-21 MED ORDER — CEPHALEXIN 500 MG PO CAPS: 500.0000 mg | ORAL_CAPSULE | Freq: Three times a day (TID) | ORAL | 0 refills | Status: AC

## 2019-09-21 NOTE — Progress Notes (Signed)
Subjective:    Patient ID: Claudia Lara, female    DOB: 05/29/1932, 85 y.o.   MRN: [id number]  HPI  Here with 2-3 days onset mild dysuria and frequency, but Denies urinary symptoms such as urgency, flank pain, hematuria or n/v, fever, chills.  Pt denies chest pain, increased sob or doe, wheezing, orthopnea, PND, increased LE swelling, palpitations, dizziness or syncope.  Pt denies new neurological symptoms such as new headache, or facial or extremity weakness or numbness   Pt denies polydipsia, polyuria  Had left knee TKR aug 31 with episode symptomatic anemia sept 2020.  Pt denies recent wt loss, night sweats, loss of appetite, or other constitutional symptoms Past Medical History:  Diagnosis Date   ANEMIA 08/22/2009   Aortic stenosis    AR (aortic regurgitation) 01/2019   Mild to Moderate, Noted on ECHO   CAROTID ARTERY STENOSIS 123456   Complication of anesthesia    very hard to wake up from surgery   DYSPNEA ON EXERTION 11/16/2007   Gallstones    GEN OSTEOARTHROSIS INVOLVING MULTIPLE SITES 11/11/2008   GERD (gastroesophageal reflux disease)    Grade I diastolic dysfunction 123456   Noted on ECHO   Heart murmur    HEMORRHOIDS, INTERNAL    surgery was in the 90's (per patient)   History of blood transfusion    after hip and knee replacements   NECK PAIN, ACUTE 12/28/2009   PERIPHERAL EDEMA 10/06/2007   PONV (postoperative nausea and vomiting)    Pulmonary nodule 06/18/2019   noted on CT Chest    PVD 10/06/2007   Unspecified essential hypertension 10/19/2007   Past Surgical History:  Procedure Laterality Date   ABDOMINAL HYSTERECTOMY     BIOPSY  08/06/2019   Procedure: BIOPSY;  Surgeon: Gatha Mayer, MD;  Location: [facility];  Service: Gastroenterology;;   [name]     bilat   CARPAL [place]  01/29/2016   at [place]  (ERCP) WITH PROPOFOL N/A 12/01/2015   Procedure: ENDOSCOPIC RETROGRADE CHOLANGIOPANCREATOGRAPHY (ERCP) WITH PROPOFOL;  Surgeon: [name], MD;  Location: [facility];  Service: Endoscopy;  Laterality: N/A;   ENDOSCOPIC RETROGRADE CHOLANGIOPANCREATOGRAPHY (ERCP) WITH PROPOFOL N/A 02/01/2016   Procedure: ENDOSCOPIC RETROGRADE CHOLANGIOPANCREATOGRAPHY (ERCP) WITH PROPOFOL;  Surgeon: [name], MD;  [facility];  Service: Endoscopy;  Laterality: N/A;   ENDOSCOPIC RETROGRADE CHOLANGIOPANCREATOGRAPHY (ERCP) WITH PROPOFOL N/A 04/07/2018   Procedure: ENDOSCOPIC RETROGRADE CHOLANGIOPANCREATOGRAPHY (ERCP) WITH PROPOFOL;  Surgeon: [name], MD;  [facility];  Service: Endoscopy;  Laterality: N/A;   ESOPHAGOGASTRODUODENOSCOPY (EGD) WITH PROPOFOL N/A 08/06/2019   Procedure: ESOPHAGOGASTRODUODENOSCOPY (EGD) WITH PROPOFOL;  Surgeon: Gatha Mayer, MD;  [facility];  Service: Gastroenterology;  Laterality: N/A;   HAMMER TOE SURGERY     HEMORRHOID SURGERY     Hyperplastic colon polyps, removed  2007   By Dr. Penelope Coop   JOINT REPLACEMENT  2011   right knee   OOPHORECTOMY     REMOVAL OF STONES  04/07/2018   Procedure: REMOVAL OF STONES;  Surgeon: [name], MD;  [facility]: [facility];  Service: Endoscopy;;   ROTATOR CUFF REPAIR  2004   left (Dr. Durward Fortes)   SPHINCTEROTOMY  04/07/2018   Procedure: Joan Mayans;  Surgeon: [name], MD;  [facility]: [facility];  Service: Endoscopy;;   TOTAL HIP ARTHROPLASTY Right 11/26/2016   Procedure: RIGHT TOTAL HIP ARTHROPLASTY;  Surgeon: Garald Balding, MD;  Location: [facility];  Service: Orthopedics;  Laterality: Right;   TOTAL KNEE ARTHROPLASTY Right    TOTAL KNEE ARTHROPLASTY Left 07/26/2019   Procedure: TOTAL KNEE ARTHROPLASTY;  Surgeon: Gaynelle Arabian, MD;  Location: [facility];  Service: Orthopedics;  Laterality: Left;  103min    reports that she has never smoked. She has never used smokeless  tobacco. She reports that she does not drink alcohol or use drugs. family history includes Cancer in her brother, father, and mother; Leukemia in her father; Lung cancer in her father; Ovarian cancer in her mother. Allergies  Allergen Reactions   Sulfonamide Derivatives Rash    Childhood reaction   Current Outpatient Medications on File Prior to Visit  Medication Sig Dispense Refill   Calcium Carb-Cholecalciferol (CALCIUM CARBONATE-VITAMIN D3 PO) Take 1 tablet by mouth daily.      carvedilol (COREG) 12.5 MG tablet TAKE 1 TABLET (12.5 MG TOTAL) BY MOUTH 2 (TWO) TIMES DAILY WITH A MEAL. 180 tablet 1   cholecalciferol (VITAMIN D) 1000 units tablet Take 1,000 Units by mouth daily.     docusate sodium (COLACE) 100 MG capsule Take 1 capsule (100 mg total) by mouth daily as needed. 30 capsule 2   ferrous sulfate 325 (65 FE) MG tablet Take 1 tablet (325 mg total) by mouth 2 (two) times daily with a meal. 40 tablet 0   gabapentin (NEURONTIN) 100 MG capsule Take 1 capsule (100 mg total) by mouth 3 (three) times daily. 1 tab 3x/day for 2 weeks. 1 tab 2x/day for 2 weeks. 1 tab a day for 2 weeks 84 capsule 0   Multiple Vitamin (MULTIVITAMIN WITH MINERALS) TABS tablet Take 1 tablet by mouth daily.     pantoprazole (PROTONIX) 40 MG tablet Take 1 tablet (40 mg total) by mouth daily. 90 tablet 1   torsemide (DEMADEX) 20 MG tablet Take 1 tablet (20 mg total) by mouth daily. 90 tablet 1   traMADol (ULTRAM) 50 MG tablet Take 1-2 tablets (50-100 mg total) by mouth every 12 (twelve) hours as needed for moderate pain. 56 tablet 0   vitamin C (ASCORBIC ACID) 500 MG tablet Take 500 mg by mouth daily.     zinc gluconate 50 MG tablet Take 1 tablet (50 mg total) daily by mouth. 90 tablet 1   No current facility-administered medications on file prior to visit.    Review of Systems  Constitutional: Negative for other unusual diaphoresis or sweats HENT: Negative for ear discharge or swelling Eyes: Negative  for other worsening visual disturbances Respiratory: Negative for stridor or other swelling  Gastrointestinal: Negative for worsening distension or other blood Genitourinary: Negative for retention or other urinary change Musculoskeletal: Negative for other MSK pain or swelling Skin: Negative for color change or other new lesions Neurological: Negative for worsening tremors and other numbness  Psychiatric/Behavioral: Negative for worsening agitation or other fatigue All otherwise neg per pt     Objective:   Physical Exam BP 140/86    Pulse 90    Temp 98.5 F (36.9 C) (Oral)    Ht 5' 2.9" (1.598 m)    Wt 163 lb (73.9 kg)    SpO2 95%    BMI 28.97 kg/m  VS noted,  Constitutional: Pt appears in NAD HENT: Head: NCAT.  Right Ear: External ear normal.  Left Ear: External ear normal.  Eyes: . Pupils are equal, round, and reactive to light. Conjunctivae and EOM are normal Nose: without d/c or deformity Neck:  Neck supple. Gross normal ROM Cardiovascular: Normal rate and regular rhythm.   Pulmonary/Chest: Effort normal and breath sounds without rales or wheezing.  Abd:  Soft, NT, ND, + BS, no organomegaly Neurological: Pt is alert. At baseline orientation, motor grossly intact Skin: Skin is warm. No rashes, other new lesions, no LE edema Psychiatric: Pt behavior is normal without agitation  All otherwise neg per pt  Lab Results  Component Value Date   WBC 10.4 09/08/2019   HGB 9.2 (L) 09/08/2019   HCT 27.2 (L) 09/08/2019   PLT 327.0 09/08/2019   GLUCOSE 107 (H) 09/08/2019   CHOL 120 06/08/2019   TRIG 104.0 06/08/2019   HDL 53.90 06/08/2019   LDLDIRECT 56.0 07/08/2018   LDLCALC 45 06/08/2019   ALT 17 09/08/2019   AST 16 09/08/2019   NA 142 09/08/2019   K 5.0 09/08/2019   CL 109 09/08/2019   CREATININE 1.13 09/08/2019   BUN 20 09/08/2019   CO2 25 09/08/2019   TSH 0.797 08/31/2019   INR 1.1 07/21/2019   HGBA1C 4.8 06/08/2019      Assessment & Plan:

## 2019-09-21 NOTE — Patient Instructions (Signed)
Please take all new medication as prescribed - the antibiotic  Please continue all other medications as before, and refills have been done if requested.  Please have the pharmacy call with any other refills you may need.  Please continue your efforts at being more active, low cholesterol diet, and weight control.  Please keep your appointments with your specialists as you may have planned  The urine studies will take 1-2 days to come back

## 2019-09-23 ENCOUNTER — Ambulatory Visit: Payer: Medicare Other | Admitting: Physical Therapy

## 2019-09-23 ENCOUNTER — Encounter: Payer: Self-pay | Admitting: Internal Medicine

## 2019-09-23 ENCOUNTER — Other Ambulatory Visit: Payer: Self-pay

## 2019-09-23 DIAGNOSIS — R600 Localized edema: Secondary | ICD-10-CM | POA: Diagnosis not present

## 2019-09-23 DIAGNOSIS — R262 Difficulty in walking, not elsewhere classified: Secondary | ICD-10-CM | POA: Diagnosis not present

## 2019-09-23 DIAGNOSIS — M25562 Pain in left knee: Secondary | ICD-10-CM | POA: Diagnosis not present

## 2019-09-23 DIAGNOSIS — M25662 Stiffness of left knee, not elsewhere classified: Secondary | ICD-10-CM

## 2019-09-23 LAB — URINE CULTURE
MICRO NUMBER:: 1034928
SPECIMEN QUALITY:: ADEQUATE

## 2019-09-23 NOTE — Assessment & Plan Note (Signed)
stable overall by history and exam, recent data reviewed with pt, and pt to continue medical treatment as before,  to f/u any worsening symptoms or concerns  

## 2019-09-23 NOTE — Assessment & Plan Note (Signed)
With probable UTI, for urine studies and empiric antibx,  to f/u any worsening symptoms or concerns

## 2019-09-23 NOTE — Therapy (Signed)
[facility] [place] [address] [place], [place], [address] Phone: [phone]   Fax:  [phone]  Physical Therapy Treatment  Patient Details  Name: Claudia Lara MRN: [id number] Date of Birth: 1932-04-15 Referring Provider (PT): Alusio   Encounter Date: 09/23/2019  PT End of Session - 09/23/19 1522    Visit Number  13    Date for PT Re-Evaluation  10/09/19    PT Start Time  6568    PT Stop Time  1528    PT Time Calculation (min)  43 min       Past Medical History:  Diagnosis Date  . ANEMIA 08/22/2009  . Aortic stenosis   . AR (aortic regurgitation) 01/2019   Mild to Moderate, Noted on ECHO  . CAROTID ARTERY STENOSIS 01/16/2010  . Complication of anesthesia    very hard to wake up from surgery  . DYSPNEA ON EXERTION 11/16/2007  . Gallstones   . GEN OSTEOARTHROSIS INVOLVING MULTIPLE SITES 11/11/2008  . GERD (gastroesophageal reflux disease)   . Grade I diastolic dysfunction 10/7516   Noted on ECHO  . Heart murmur   . HEMORRHOIDS, INTERNAL    surgery was in the 90's (per patient)  . History of blood transfusion    after hip and knee replacements  . NECK PAIN, ACUTE 12/28/2009  . PERIPHERAL EDEMA 10/06/2007  . PONV (postoperative nausea and vomiting)   . Pulmonary nodule 06/18/2019   noted on CT Chest   . PVD 10/06/2007  . Unspecified essential hypertension 10/19/2007    Past Surgical History:  Procedure Laterality Date  . ABDOMINAL HYSTERECTOMY    . BIOPSY  08/06/2019   Procedure: BIOPSY;  Surgeon: Gatha Mayer, MD;  Location: [facility];  Service: Gastroenterology;;  . [name]     bilat  . CARPAL TUNNEL RELEASE    . CHOLECYSTECTOMY  01/29/2016   at [facility]  . CHOLECYSTECTOMY    . ENDOSCOPIC RETROGRADE CHOLANGIOPANCREATOGRAPHY (ERCP) WITH PROPOFOL N/A 12/01/2015   Procedure: ENDOSCOPIC RETROGRADE CHOLANGIOPANCREATOGRAPHY (ERCP) WITH PROPOFOL;  Surgeon: [name], MD;   [facility];  Service: Endoscopy;  Laterality: N/A;  . ENDOSCOPIC RETROGRADE CHOLANGIOPANCREATOGRAPHY (ERCP) WITH PROPOFOL N/A 02/01/2016   Procedure: ENDOSCOPIC RETROGRADE CHOLANGIOPANCREATOGRAPHY (ERCP) WITH PROPOFOL;  Surgeon: [name], MD;  [facility];  Service: Endoscopy;  Laterality: N/A;  . ENDOSCOPIC RETROGRADE CHOLANGIOPANCREATOGRAPHY (ERCP) WITH PROPOFOL N/A 04/07/2018   Procedure: ENDOSCOPIC RETROGRADE CHOLANGIOPANCREATOGRAPHY (ERCP) WITH PROPOFOL;  Surgeon: [name], MD;  [facility];  Service: Endoscopy;  Laterality: N/A;  . ESOPHAGOGASTRODUODENOSCOPY (EGD) WITH PROPOFOL N/A 08/06/2019   Procedure: ESOPHAGOGASTRODUODENOSCOPY (EGD) WITH PROPOFOL;  Surgeon: Gatha Mayer, MD;  [facility];  Service: Gastroenterology;  Laterality: N/A;  . HAMMER TOE SURGERY    . HEMORRHOID SURGERY    . Hyperplastic colon polyps, removed  2007   By Dr. Penelope Coop  . JOINT REPLACEMENT  2011   right knee  . OOPHORECTOMY    . REMOVAL OF STONES  04/07/2018   Procedure: REMOVAL OF STONES;  Surgeon: [name], MD;  [facility];  Service: Endoscopy;;  . ROTATOR CUFF REPAIR  2004   left (Dr. Durward Fortes)  . SPHINCTEROTOMY  04/07/2018   Procedure: SPHINCTEROTOMY;  Surgeon: [name], MD;  Location: [facility];  Service: Endoscopy;;  . TOTAL HIP ARTHROPLASTY Right 11/26/2016   Procedure: RIGHT TOTAL HIP ARTHROPLASTY;  Surgeon: Garald Balding, MD;  Location: [facility];  Service: Orthopedics;  Laterality: Right;  . TOTAL KNEE ARTHROPLASTY Right   . TOTAL KNEE ARTHROPLASTY Left 07/26/2019   Procedure: TOTAL KNEE ARTHROPLASTY;  Surgeon: Gaynelle Arabian, MD;  [facility];  Service: Orthopedics;  Laterality: Left;  63mn    There were no vitals filed for this visit.  Subjective Assessment - 09/23/19 1441    Subjective  doing better. having an infusion next week    Currently in Pain?  No/denies         [facility] -  09[facility]23[facility]19 0001      AROM   Right/Left Knee  Left    Left Knee Extension  3    Left Knee Flexion  110                   [facility] Adult PT Treatment/Exercise - 09/23/19 0001      Ambulation/Gait   Gait Comments  gait without device, improved BOS and cadeance on level surface, rec to use spc outside home okay to go without in house      Knee/Hip Exercises: Aerobic   Recumbent Bike  5 min , seat # 5 full rev    Nustep  L 4 6 min LE only      Knee/Hip Exercises: Machines for Strengthening   Cybex Knee Extension  5# 10 x , 10# 10x    Cybex Knee Flexion  25 # 2 sets 10    Cybex Leg Press  30# 2 sets 10   calf raises 30# 2 sets 10     Knee/Hip Exercises: Standing   Forward Step Up  Left;10 reps;Hand Hold: 2;Step Height: 6";2 sets   cuing   Forward Step Up Limitations  decreased TKE and decreased eccentric control      Manual Therapy   Manual Therapy  Passive ROM    Passive ROM  PROM of the left knee flexion and extnension   AROM taken after MT              PT Short Term Goals - 08/25/19 1011      PT SHORT TERM GOAL #1   Title  independent with initial HEP        PT Long Term Goals - 09/23/19 1524      PT LONG TERM GOAL #1   Title  walk 200 feet with SPC or no device    Status  Partially Met      PT LONG TERM GOAL #2   Title  go up her steps step over step    Status  Partially Met      PT LONG TERM GOAL #3   Title  increase AROM of the left knee to 5-115 degrees flexion    Status  Partially Met      PT LONG TERM GOAL #4   Title  decrease pain 50%    Status  Achieved      PT LONG TERM GOAL #5   Title  decrease TUG to 16 seconds    Status  Achieved            Plan - 09/23/19 1523    Clinical Impression Statement  improved AROM after MT. tolerated  increase in wt for strength. increased BOS and cadeance with gait without AD on level surface. progressing with all goals. steps are still an issue fpr pt decreased TKE and eccentric lowering.     PT Treatment/Interventions  ADLs/Self Care Home Management;Electrical Stimulation;Cryotherapy;Ultrasound;Therapeutic activities;Therapeutic exercise;Patient/family education;Manual techniques;Stair training;Gait  training;Functional mobility training;Balance training;Vasopneumatic Device    PT Next Visit Plan  proepare for D?C . pt verb ready to be done. getting infusion 11/3 and MD following week so rec HOLD       Patient will benefit from skilled therapeutic intervention in order to improve the following deficits and impairments:  Abnormal gait, Pain, Decreased scar mobility, Decreased mobility, Cardiopulmonary status limiting activity, Decreased activity tolerance, Decreased endurance, Decreased range of motion, Decreased strength, Increased edema, Difficulty walking, Decreased balance  Visit Diagnosis: Stiffness of left knee, not elsewhere classified  Difficulty in walking, not elsewhere classified     Problem List Patient Active Problem List   Diagnosis Date Noted  . Dysuria 09/21/2019  . Iron deficiency anemia due to chronic blood loss 09/08/2019  . Interstitial pulmonary disease ([place]) 09/08/2019  . Anemia 08/31/2019  . Lower leg DVT (deep venous thromboembolism), acute, left ([place]) 08/18/2019  . Hypotension due to hypovolemia 08/04/2019  . OA (osteoarthritis) of knee 07/26/2019  . Lung nodule 06/24/2019  . Bilateral leg edema 06/08/2019  . Zinc deficiency 10/03/2017  . Spondylosis, lumbar, with myelopathy 04/09/2017  . Hyperglycemia 03/05/2016  . GERD (gastroesophageal reflux disease) 08/03/2015  . Hemolytic anemia ([place]) 07/12/2015  . Chronic venous insufficiency 08/10/2014  . Aortic stenosis, mild 06/29/2014  . Left ventricular diastolic dysfunction, NYHA class 1 06/29/2014  . Senile osteopenia 04/21/2014  . Hyperlipidemia with target LDL less than 130 04/21/2014  . Kidney disease, chronic, stage III (GFR 30-59 ml/min) ([place]) 04/21/2014  . Routine health maintenance  07/16/2012  . Carotid artery stenosis without cerebral infarction 01/16/2010  . Essential hypertension 10/19/2007    ,ANGIE PTA 09/23/2019, 3:28 PM  [place] [address] [place], [place], [address] Phone: [phone]   Fax:  [phone]  Name: DORENDA PFANNENSTIEL MRN: [id number] Date of Birth: October 29, 1932

## 2019-09-27 ENCOUNTER — Ambulatory Visit: Payer: Medicare Other | Attending: Orthopedic Surgery | Admitting: Physical Therapy

## 2019-09-27 ENCOUNTER — Encounter: Payer: Self-pay | Admitting: Physical Therapy

## 2019-09-27 ENCOUNTER — Other Ambulatory Visit: Payer: Self-pay

## 2019-09-27 DIAGNOSIS — R262 Difficulty in walking, not elsewhere classified: Secondary | ICD-10-CM | POA: Insufficient documentation

## 2019-09-27 DIAGNOSIS — M25662 Stiffness of left knee, not elsewhere classified: Secondary | ICD-10-CM | POA: Diagnosis not present

## 2019-09-27 DIAGNOSIS — M25562 Pain in left knee: Secondary | ICD-10-CM | POA: Diagnosis not present

## 2019-09-27 NOTE — Therapy (Signed)
[address] [place], [place], [address] Phone: [phone]   Fax:  [phone]  Physical Therapy Treatment  Patient Details  Name: Claudia Lara MRN: [id number] Date of Birth: [birth year] Referring Provider (PT): Alusio   Encounter Date: 09/27/2019  PT End of Session - 09/27/19 1517    Visit Number  14    Date for PT Re-Evaluation  10/09/19    PT Start Time  1440    PT Stop Time  1525    PT Time Calculation (min)  45 min    Activity Tolerance  Patient tolerated treatment well;Patient limited by fatigue    Behavior During Therapy  [facility] for tasks assessed/performed       Past Medical History:  Diagnosis Date  . ANEMIA 08/22/2009  . Aortic stenosis   . AR (aortic regurgitation) 01/2019   Mild to Moderate, Noted on ECHO  . CAROTID ARTERY STENOSIS 01/16/2010  . Complication of anesthesia    very hard to wake up from surgery  . DYSPNEA ON EXERTION 11/16/2007  . Gallstones   . GEN OSTEOARTHROSIS INVOLVING MULTIPLE SITES 11/11/2008  . GERD (gastroesophageal reflux disease)   . Grade I diastolic dysfunction 69/6295   Noted on ECHO  . Heart murmur   . HEMORRHOIDS, INTERNAL    surgery was in the 90's (per patient)  . History of blood transfusion    after hip and knee replacements  . NECK PAIN, ACUTE 12/28/2009  . PERIPHERAL EDEMA 10/06/2007  . PONV (postoperative nausea and vomiting)   . Pulmonary nodule 06/18/2019   noted on CT Chest   . PVD 10/06/2007  . Unspecified essential hypertension 10/19/2007    Past Surgical History:  Procedure Laterality Date  . ABDOMINAL HYSTERECTOMY    . BIOPSY  08/06/2019   Procedure: BIOPSY;  Surgeon: Gatha Mayer, MD;  Location: [facility];  Service: Gastroenterology;;  . [name]     bilat  . CARPAL TUNNEL RELEASE    . CHOLECYSTECTOMY  01/29/2016   at [facility]  . CHOLECYSTECTOMY    . ENDOSCOPIC RETROGRADE CHOLANGIOPANCREATOGRAPHY (ERCP)  WITH PROPOFOL N/A 12/01/2015   Procedure: ENDOSCOPIC RETROGRADE CHOLANGIOPANCREATOGRAPHY (ERCP) WITH PROPOFOL;  Surgeon: [name], MD;  Location: [facility];  Service: Endoscopy;  Laterality: N/A;  . ENDOSCOPIC RETROGRADE CHOLANGIOPANCREATOGRAPHY (ERCP) WITH PROPOFOL N/A 02/01/2016   Procedure: ENDOSCOPIC RETROGRADE CHOLANGIOPANCREATOGRAPHY (ERCP) WITH PROPOFOL;  Surgeon: [name], MD;  Location: [facility];  Service: Endoscopy;  Laterality: N/A;  . ENDOSCOPIC RETROGRADE CHOLANGIOPANCREATOGRAPHY (ERCP) WITH PROPOFOL N/A 04/07/2018   Procedure: ENDOSCOPIC RETROGRADE CHOLANGIOPANCREATOGRAPHY (ERCP) WITH PROPOFOL;  Surgeon: [name], MD;  Location: [facility];  Service: Endoscopy;  Laterality: N/A;  . ESOPHAGOGASTRODUODENOSCOPY (EGD) WITH PROPOFOL N/A 08/06/2019   Procedure: ESOPHAGOGASTRODUODENOSCOPY (EGD) WITH PROPOFOL;  Surgeon: Gatha Mayer, MD;  Location: [facility];  Service: Gastroenterology;  Laterality: N/A;  . HAMMER TOE SURGERY    . HEMORRHOID SURGERY    . Hyperplastic colon polyps, removed  2007   By Dr. Penelope Coop  . JOINT REPLACEMENT  2011   right knee  . OOPHORECTOMY    . REMOVAL OF STONES  04/07/2018   Procedure: REMOVAL OF STONES;  Surgeon: [name], MD;  Location: [facility];  Service: Endoscopy;;  . ROTATOR CUFF REPAIR  2004   left (Dr. Durward Fortes)  . SPHINCTEROTOMY  04/07/2018   Procedure: SPHINCTEROTOMY;  Surgeon: [name], MD;  Location: [facility];  Service: Endoscopy;;  .  TOTAL HIP ARTHROPLASTY Right 11/26/2016   Procedure: RIGHT TOTAL HIP ARTHROPLASTY;  Surgeon: Garald Balding, MD;  Location: [facility];  Service: Orthopedics;  Laterality: Right;  . TOTAL KNEE ARTHROPLASTY Right   . TOTAL KNEE ARTHROPLASTY Left 07/26/2019   Procedure: TOTAL KNEE ARTHROPLASTY;  Surgeon: Gaynelle Arabian, MD;  Location: [facility];  Service: Orthopedics;  Laterality: Left;  89mn    There were no vitals filed for this visit.  Subjective Assessment -  09/27/19 1442    Subjective  Having blood infusion tomorrow, I am stiff today    Currently in Pain?  Yes    Pain Score  2     Pain Location  Knee    Pain Orientation  Left    Pain Descriptors / Indicators  Tightness;Sore    Aggravating Factors   tight with bending                       [facility] Adult PT Treatment/Exercise - 09/27/19 0001      Ambulation/Gait   Gait Comments  no device in hall, around and over obstacles      High Level Balance   High Level Balance Activities  Negotitating around obstacles;Negotiating over obstacles    High Level Balance Comments  ball kicks, walking ball toss      Knee/Hip Exercises: Aerobic   Recumbent Bike  5 min , seat # 5 full rev    Nustep  L 4 6 min LE only      Knee/Hip Exercises: Machines for Strengthening   Cybex Knee Extension  10# 2x10    Cybex Knee Flexion  25# 2x10      Manual Therapy   Manual Therapy  Passive ROM    Passive ROM  PROM of the left knee flexion and extnension               PT Short Term Goals - 08/25/19 1011      PT SHORT TERM GOAL #1   Title  independent with initial HEP        PT Long Term Goals - 09/27/19 1527      PT LONG TERM GOAL #1   Title  walk 200 feet with SPC or no device    Status  Partially Met      PT LONG TERM GOAL #2   Title  go up her steps step over step    Status  Partially Met      PT LONG TERM GOAL #3   Title  increase AROM of the left knee to 5-115 degrees flexion    Status  Partially Met      PT LONG TERM GOAL #4   Title  decrease pain 50%    Status  Achieved      PT LONG TERM GOAL #5   Title  decrease TUG to 16 seconds    Status  Achieved            Plan - 09/27/19 1518    Clinical Impression Statement  Patient overall doing well, she is having an infusion tomorrow, we will hold treatment, she is going to see the surgeon next week.  She has some issues with balance and stepping over objects.  Continues to be very stiff at times    PT Next  Visit Plan  will hold until after infusion and the MD appointment    Consulted and Agree with Plan of Care  Patient  Patient will benefit from skilled therapeutic intervention in order to improve the following deficits and impairments:  Abnormal gait, Pain, Decreased scar mobility, Decreased mobility, Cardiopulmonary status limiting activity, Decreased activity tolerance, Decreased endurance, Decreased range of motion, Decreased strength, Increased edema, Difficulty walking, Decreased balance  Visit Diagnosis: Stiffness of left knee, not elsewhere classified  Difficulty in walking, not elsewhere classified  Acute pain of left knee     Problem List Patient Active Problem List   Diagnosis Date Noted  . Dysuria 09/21/2019  . Iron deficiency anemia due to chronic blood loss 09/08/2019  . Interstitial pulmonary disease ([place]) 09/08/2019  . Anemia 08/31/2019  . Lower leg DVT (deep venous thromboembolism), acute, left ([place]) 08/18/2019  . Hypotension due to hypovolemia 08/04/2019  . OA (osteoarthritis) of knee 07/26/2019  . Lung nodule 06/24/2019  . Bilateral leg edema 06/08/2019  . Zinc deficiency 10/03/2017  . Spondylosis, lumbar, with myelopathy 04/09/2017  . Hyperglycemia 03/05/2016  . GERD (gastroesophageal reflux disease) 08/03/2015  . Hemolytic anemia ([place]) 07/12/2015  . Chronic venous insufficiency 08/10/2014  . Aortic stenosis, mild 06/29/2014  . Left ventricular diastolic dysfunction, NYHA class 1 06/29/2014  . Senile osteopenia 04/21/2014  . Hyperlipidemia with target LDL less than 130 04/21/2014  . Kidney disease, chronic, stage III (GFR 30-59 ml/min) ([place]) 04/21/2014  . Routine health maintenance 07/16/2012  . Carotid artery stenosis without cerebral infarction 01/16/2010  . Essential hypertension 10/19/2007    Sumner Boast., PT 09/27/2019, 3:28 PM  [address],  [place], [address] Phone: [phone]   Fax:  [phone]  Name: Claudia Lara MRN: [id number] Date of Birth: July 21, 1932

## 2019-09-28 ENCOUNTER — Ambulatory Visit (HOSPITAL_COMMUNITY)
Admission: RE | Admit: 2019-09-28 | Discharge: 2019-09-28 | Disposition: A | Discharge status: Home / Self Care | Payer: Medicare Other | Source: Ambulatory Visit | Attending: Internal Medicine | Admitting: Internal Medicine

## 2019-09-28 DIAGNOSIS — D500 Iron deficiency anemia secondary to blood loss (chronic): Secondary | ICD-10-CM | POA: Diagnosis not present

## 2019-09-28 MED ORDER — SODIUM CHLORIDE 0.9 % IV SOLN
INTRAVENOUS | Status: DC | PRN
  Administered 2019-09-28: 09:00:00 250 mL via INTRAVENOUS

## 2019-09-28 MED ORDER — SODIUM CHLORIDE 0.9 % IV SOLN
750.0000 mg | Freq: Once | INTRAVENOUS | Status: AC
  Administered 2019-09-28: 09:00:00 750 mg via INTRAVENOUS
  Filled 2019-09-28: qty 15

## 2019-09-28 NOTE — Progress Notes (Signed)
PATIENT CARE CENTER NOTE  Diagnosis: Iron deficiency anemia due to chronic blood loss (D50.0)   Provider: Jones, Thomas, MD   Procedure: Injectafer IV   Note: Patient received Injectafer infusion via PIV. Tolerated well with no adverse reaction. Observed patient for 30 minutes post-infusion. Vital signs stable. Discharge instructions given. Patient to come back next week for second infusion. Alert, oriented and ambulatory at discharge.   

## 2019-09-28 NOTE — Discharge Instructions (Signed)

## 2019-09-30 ENCOUNTER — Ambulatory Visit: Payer: Medicare Other | Admitting: Physical Therapy

## 2019-10-06 ENCOUNTER — Ambulatory Visit (HOSPITAL_COMMUNITY)
Admission: RE | Admit: 2019-10-06 | Discharge: 2019-10-06 | Disposition: A | Discharge status: Home / Self Care | Payer: Medicare Other | Source: Ambulatory Visit | Attending: Internal Medicine | Admitting: Internal Medicine

## 2019-10-06 ENCOUNTER — Other Ambulatory Visit: Payer: Self-pay

## 2019-10-06 DIAGNOSIS — B078 Other viral warts: Secondary | ICD-10-CM | POA: Diagnosis not present

## 2019-10-06 DIAGNOSIS — D500 Iron deficiency anemia secondary to blood loss (chronic): Secondary | ICD-10-CM | POA: Diagnosis not present

## 2019-10-06 DIAGNOSIS — L570 Actinic keratosis: Secondary | ICD-10-CM | POA: Diagnosis not present

## 2019-10-06 DIAGNOSIS — X32XXXD Exposure to sunlight, subsequent encounter: Secondary | ICD-10-CM | POA: Diagnosis not present

## 2019-10-06 MED ORDER — SODIUM CHLORIDE 0.9 % IV SOLN
750.0000 mg | Freq: Once | INTRAVENOUS | Status: AC
  Administered 2019-10-06: 750 mg via INTRAVENOUS
  Filled 2019-10-06: qty 15

## 2019-10-06 MED ORDER — SODIUM CHLORIDE 0.9 % IV SOLN
INTRAVENOUS | Status: DC | PRN
  Administered 2019-10-06: 250 mL via INTRAVENOUS

## 2019-10-06 NOTE — Discharge Instructions (Signed)

## 2019-10-06 NOTE — Progress Notes (Signed)
Patient received Injectafer via PIV. Observed for at least 30 minutes post infusion.Tolerated well, vitals stable, discharge instructions given, verbalized understanding. Patient alert, oriented and ambulatory at the time of discharge.  

## 2019-10-14 ENCOUNTER — Telehealth: Payer: Self-pay | Admitting: Physical Medicine and Rehabilitation

## 2019-10-14 NOTE — Telephone Encounter (Signed)
ok 

## 2019-10-15 NOTE — Telephone Encounter (Signed)
Left message #1

## 2019-10-18 NOTE — Telephone Encounter (Signed)
Is auth needed for [phone]? Scheduled for 12/15 with driver and no blood thinner.

## 2019-10-18 NOTE — Telephone Encounter (Signed)
Per [facility] website no pa is needed for [phone] in an office setting.

## 2019-11-09 ENCOUNTER — Ambulatory Visit: Payer: Self-pay

## 2019-11-09 ENCOUNTER — Other Ambulatory Visit: Payer: Self-pay

## 2019-11-09 ENCOUNTER — Encounter: Payer: Self-pay | Admitting: Physical Medicine and Rehabilitation

## 2019-11-09 ENCOUNTER — Ambulatory Visit: Payer: Medicare Other | Admitting: Physical Medicine and Rehabilitation

## 2019-11-09 VITALS — BP 177/69 | HR 79

## 2019-11-09 DIAGNOSIS — M48061 Spinal stenosis, lumbar region without neurogenic claudication: Secondary | ICD-10-CM | POA: Diagnosis not present

## 2019-11-09 DIAGNOSIS — M5416 Radiculopathy, lumbar region: Secondary | ICD-10-CM

## 2019-11-09 MED ORDER — METHYLPREDNISOLONE ACETATE 80 MG/ML IJ SUSP
80.0000 mg | Freq: Once | INTRAMUSCULAR | Status: AC
  Administered 2019-11-09: 16:00:00 80 mg

## 2019-11-09 NOTE — Progress Notes (Signed)
Pt states pain in the lower back and into both buttocks. Pt states pain started years ago. Pt states pain started awhile ago. Walking makes pain worse. Hemp freeze helps with pain.   .Numeric Pain Rating Scale and Functional Assessment Average Pain 5   In the last MONTH (on 0-10 scale) has pain interfered with the following?  1. General activity like being  able to carry out your everyday physical activities such as walking, climbing stairs, carrying groceries, or moving a chair?  Rating(6)   +Driver, -BT, -Dye Allergies.

## 2019-11-28 ENCOUNTER — Other Ambulatory Visit: Payer: Self-pay | Admitting: Internal Medicine

## 2019-11-28 DIAGNOSIS — I10 Essential (primary) hypertension: Secondary | ICD-10-CM

## 2019-11-28 DIAGNOSIS — R600 Localized edema: Secondary | ICD-10-CM

## 2019-12-07 ENCOUNTER — Other Ambulatory Visit: Payer: Self-pay | Admitting: Internal Medicine

## 2019-12-07 DIAGNOSIS — K219 Gastro-esophageal reflux disease without esophagitis: Secondary | ICD-10-CM

## 2019-12-14 ENCOUNTER — Ambulatory Visit (INDEPENDENT_AMBULATORY_CARE_PROVIDER_SITE_OTHER): Payer: Medicare Other | Admitting: Internal Medicine

## 2019-12-14 ENCOUNTER — Other Ambulatory Visit: Payer: Self-pay

## 2019-12-14 ENCOUNTER — Encounter: Payer: Self-pay | Admitting: Internal Medicine

## 2019-12-14 VITALS — BP 152/62 | HR 72 | Temp 97.6°F | Resp 16 | Ht 62.9 in | Wt 164.2 lb

## 2019-12-14 DIAGNOSIS — D500 Iron deficiency anemia secondary to blood loss (chronic): Secondary | ICD-10-CM | POA: Diagnosis not present

## 2019-12-14 DIAGNOSIS — I10 Essential (primary) hypertension: Secondary | ICD-10-CM

## 2019-12-14 DIAGNOSIS — D599 Acquired hemolytic anemia, unspecified: Secondary | ICD-10-CM | POA: Diagnosis not present

## 2019-12-14 DIAGNOSIS — E60 Dietary zinc deficiency: Secondary | ICD-10-CM | POA: Diagnosis not present

## 2019-12-14 LAB — CBC WITH DIFFERENTIAL/PLATELET
Basophils Absolute: 0 10*3/uL (ref 0.0–0.1)
Basophils Relative: 0.6 % (ref 0.0–3.0)
Eosinophils Absolute: 0.2 10*3/uL (ref 0.0–0.7)
Eosinophils Relative: 2.2 % (ref 0.0–5.0)
HCT: 27.7 % — ABNORMAL LOW (ref 36.0–46.0)
Hemoglobin: 9.5 g/dL — ABNORMAL LOW (ref 12.0–15.0)
Lymphocytes Relative: 15 % (ref 12.0–46.0)
Lymphs Abs: 1.2 10*3/uL (ref 0.7–4.0)
MCHC: 34.3 g/dL (ref 30.0–36.0)
MCV: 91.7 fl (ref 78.0–100.0)
Monocytes Absolute: 0.6 10*3/uL (ref 0.1–1.0)
Monocytes Relative: 7.9 % (ref 3.0–12.0)
Neutro Abs: 5.9 10*3/uL (ref 1.4–7.7)
Neutrophils Relative %: 74.3 % (ref 43.0–77.0)
Platelets: 205 10*3/uL (ref 150.0–400.0)
RBC: 3.02 Mil/uL — ABNORMAL LOW (ref 3.87–5.11)
RDW: 20.6 % — ABNORMAL HIGH (ref 11.5–15.5)
WBC: 7.9 10*3/uL (ref 4.0–10.5)

## 2019-12-14 LAB — HEPATIC FUNCTION PANEL
ALT: 9 U/L (ref 0–35)
AST: 15 U/L (ref 0–37)
Albumin: 4.3 g/dL (ref 3.5–5.2)
Alkaline Phosphatase: 66 U/L (ref 39–117)
Bilirubin, Direct: 0.4 mg/dL — ABNORMAL HIGH (ref 0.0–0.3)
Total Bilirubin: 2 mg/dL — ABNORMAL HIGH (ref 0.2–1.2)
Total Protein: 6.7 g/dL (ref 6.0–8.3)

## 2019-12-14 LAB — IBC PANEL
Iron: 89 ug/dL (ref 42–145)
Saturation Ratios: 43.2 % (ref 20.0–50.0)
Transferrin: 147 mg/dL — ABNORMAL LOW (ref 212.0–360.0)

## 2019-12-14 LAB — FERRITIN: Ferritin: 1118.2 ng/mL — ABNORMAL HIGH (ref 10.0–291.0)

## 2019-12-14 MED ORDER — FERROUS SULFATE 325 (65 FE) MG PO TABS: 325.0000 mg | ORAL_TABLET | Freq: Two times a day (BID) | ORAL | 1 refills | Status: DC

## 2019-12-14 NOTE — Patient Instructions (Signed)
Anemia  Anemia is a condition in which you do not have enough red blood cells or hemoglobin. Hemoglobin is a substance in red blood cells that carries oxygen. When you do not have enough red blood cells or hemoglobin (are anemic), your body cannot get enough oxygen and your organs may not work properly. As a result, you may feel very tired or have other problems. What are the causes? Common causes of anemia include:  Excessive bleeding. Anemia can be caused by excessive bleeding inside or outside the body, including bleeding from the intestine or from periods in women.  Poor nutrition.  Long-lasting (chronic) kidney, thyroid, and liver disease.  Bone marrow disorders.  Cancer and treatments for cancer.  HIV (human immunodeficiency virus) and AIDS (acquired immunodeficiency syndrome).  Treatments for HIV and AIDS.  Spleen problems.  Blood disorders.  Infections, medicines, and autoimmune disorders that destroy red blood cells. What are the signs or symptoms? Symptoms of this condition include:  Minor weakness.  Dizziness.  Headache.  Feeling heartbeats that are irregular or faster than normal (palpitations).  Shortness of breath, especially with exercise.  Paleness.  Cold sensitivity.  Indigestion.  Nausea.  Difficulty sleeping.  Difficulty concentrating. Symptoms may occur suddenly or develop slowly. If your anemia is mild, you may not have symptoms. How is this diagnosed? This condition is diagnosed based on:  Blood tests.  Your medical history.  A physical exam.  Bone marrow biopsy. Your health care provider may also check your stool (feces) for blood and may do additional testing to look for the cause of your bleeding. You may also have other tests, including:  Imaging tests, such as a CT scan or MRI.  Endoscopy.  Colonoscopy. How is this treated? Treatment for this condition depends on the cause. If you continue to lose a lot of blood, you may  need to be treated at a hospital. Treatment may include:  Taking supplements of iron, vitamin S31, or folic acid.  Taking a hormone medicine (erythropoietin) that can help to stimulate red blood cell growth.  Having a blood transfusion. This may be needed if you lose a lot of blood.  Making changes to your diet.  Having surgery to remove your spleen. Follow these instructions at home:  Take over-the-counter and prescription medicines only as told by your health care provider.  Take supplements only as told by your health care provider.  Follow any diet instructions that you were given.  Keep all follow-up visits as told by your health care provider. This is important. Contact a health care provider if:  You develop new bleeding anywhere in the body. Get help right away if:  You are very weak.  You are short of breath.  You have pain in your abdomen or chest.  You are dizzy or feel faint.  You have trouble concentrating.  You have bloody or black, tarry stools.  You vomit repeatedly or you vomit up blood. Summary  Anemia is a condition in which you do not have enough red blood cells or enough of a substance in your red blood cells that carries oxygen (hemoglobin).  Symptoms may occur suddenly or develop slowly.  If your anemia is mild, you may not have symptoms.  This condition is diagnosed with blood tests as well as a medical history and physical exam. Other tests may be needed.  Treatment for this condition depends on the cause of the anemia. This information is not intended to replace advice given to you by  your health care provider. Make sure you discuss any questions you have with your health care provider. Document Revised: 10/24/2017 Document Reviewed: 12/13/2016 Elsevier Patient Education  [place].

## 2019-12-14 NOTE — Progress Notes (Signed)
Subjective:  Patient ID: Claudia Lara, female    DOB: 1932-11-23  Age: 86 y.o. MRN: [id number]  CC: Anemia and Hypertension  This visit occurred during the SARS-CoV-2 public health emergency.  Safety protocols were in place, including screening questions prior to the visit, additional usage of staff PPE, and extensive cleaning of exam room while observing appropriate contact time as indicated for disinfecting solutions.    HPI Claudia Lara presents for f/up - She is status post iron infusions.  She complains about how much it cost but she says it did not help her feel any better.  She continues to complain of fatigue and chronic low back pain.  She is not currently taking an iron supplement.  She denies chest pain, palpitations, dizziness, lightheadedness, edema, DOE, or S OB.  Outpatient Medications Prior to Visit  Medication Sig Dispense Refill  . Calcium Carb-Cholecalciferol (CALCIUM CARBONATE-VITAMIN D3 PO) Take 1 tablet by mouth daily.     . carvedilol (COREG) 12.5 MG tablet TAKE 1 TABLET (12.5 MG TOTAL) BY MOUTH 2 (TWO) TIMES DAILY WITH A MEAL. 180 tablet 1  . cholecalciferol (VITAMIN D) 1000 units tablet Take 1,000 Units by mouth daily.    . Multiple Vitamin (MULTIVITAMIN WITH MINERALS) TABS tablet Take 1 tablet by mouth daily.    . pantoprazole (PROTONIX) 40 MG tablet TAKE 1 TABLET BY MOUTH EVERY DAY 90 tablet 1  . torsemide (DEMADEX) 20 MG tablet TAKE 1 TABLET BY MOUTH EVERY DAY 90 tablet 1  . traMADol (ULTRAM) 50 MG tablet Take 1-2 tablets (50-100 mg total) by mouth every 12 (twelve) hours as needed for moderate pain. 56 tablet 0  . docusate sodium (COLACE) 100 MG capsule Take 1 capsule (100 mg total) by mouth daily as needed. 30 capsule 2  . gabapentin (NEURONTIN) 100 MG capsule Take 1 capsule (100 mg total) by mouth 3 (three) times daily. 1 tab 3x/day for 2 weeks. 1 tab 2x/day for 2 weeks. 1 tab a day for 2 weeks (Patient not taking: Reported on 11/09/2019) 84 capsule 0  .  vitamin C (ASCORBIC ACID) 500 MG tablet Take 500 mg by mouth daily.    [name] zinc gluconate 50 MG tablet Take 1 tablet (50 mg total) daily by mouth. (Patient not taking: Reported on 12/14/2019) 90 tablet 1  . ferrous sulfate 325 (65 FE) MG tablet Take 1 tablet (325 mg total) by mouth 2 (two) times daily with a meal. (Patient not taking: Reported on 11/09/2019) 40 tablet 0   No facility-administered medications prior to visit.    ROS Review of Systems  Constitutional: Positive for fatigue. Negative for appetite change, chills, diaphoresis, fever and unexpected weight change.  HENT: Negative.   Eyes: Negative for visual disturbance.  Respiratory: Negative for cough, chest tightness, shortness of breath and wheezing.   Cardiovascular: Negative for chest pain, palpitations and leg swelling.  Gastrointestinal: Negative for abdominal pain, blood in stool, constipation, diarrhea, nausea and vomiting.  Genitourinary: Negative.  Negative for difficulty urinating.  Musculoskeletal: Positive for arthralgias and back pain. Negative for myalgias and neck pain.  Skin: Negative.  Negative for color change and pallor.  Neurological: Negative.  Negative for dizziness, weakness, light-headedness and numbness.  Hematological: Negative.  Negative for adenopathy. Does not bruise/bleed easily.  Psychiatric/Behavioral: Negative.     Objective:  BP (!) 152/62 (BP Location: Right Arm, Patient Position: Sitting, Cuff Size: Normal)   Pulse 72   Temp 97.6 F (36.4 C) (Oral)   Resp  16   Ht 5' 2.9" (1.598 m)   Wt 164 lb 3.2 oz (74.5 kg)   SpO2 96%   BMI 29.18 kg/m   BP Readings from Last 3 Encounters:  12/14/19 (!) 152/62  11/09/19 (!) 177/69  10/06/19 (!) 136/43    Wt Readings from Last 3 Encounters:  12/14/19 164 lb 3.2 oz (74.5 kg)  09/21/19 163 lb (73.9 kg)  09/08/19 171 lb (77.6 kg)    Physical Exam Vitals reviewed.  Constitutional:      General: She is not in acute distress.    Appearance:  Normal appearance. She is not ill-appearing, toxic-appearing or diaphoretic.  HENT:     Nose: Nose normal.     Mouth/Throat:     Lips: Pink.     Mouth: Mucous membranes are moist. Mucous membranes are pale.     Pharynx: No oropharyngeal exudate.  Eyes:     General: No scleral icterus.    Conjunctiva/sclera: Conjunctivae normal.  Cardiovascular:     Rate and Rhythm: Normal rate and regular rhythm.     Heart sounds: Murmur present. Systolic murmur present with a grade of 2/6. No diastolic murmur. No gallop.   Pulmonary:     Effort: Pulmonary effort is normal.     Breath sounds: No stridor. No wheezing, rhonchi or rales.  Abdominal:     General: Abdomen is flat.     Palpations: There is no mass.     Tenderness: There is no abdominal tenderness. There is no guarding.  Musculoskeletal:        General: Normal range of motion.     Cervical back: Neck supple. No tenderness.     Right lower leg: No edema.  Lymphadenopathy:     Cervical: No cervical adenopathy.  Skin:    General: Skin is warm and dry.     Coloration: Skin is pale.  Neurological:     General: No focal deficit present.     Mental Status: She is alert.  Psychiatric:        Mood and Affect: Mood normal.        Behavior: Behavior normal.     Lab Results  Component Value Date   WBC 7.9 12/14/2019   HGB 9.5 (L) 12/14/2019   HCT 27.7 (L) 12/14/2019   PLT 205.0 12/14/2019   GLUCOSE 107 (H) 09/08/2019   CHOL 120 06/08/2019   TRIG 104.0 06/08/2019   HDL 53.90 06/08/2019   LDLDIRECT 56.0 07/08/2018   LDLCALC 45 06/08/2019   ALT 9 12/14/2019   AST 15 12/14/2019   NA 142 09/08/2019   K 5.0 09/08/2019   CL 109 09/08/2019   CREATININE 1.13 09/08/2019   BUN 20 09/08/2019   CO2 25 09/08/2019   TSH 0.797 08/31/2019   INR 1.1 07/21/2019   HGBA1C 4.8 06/08/2019    No results found.  Assessment & Plan:   Claudia Lara was seen today for anemia and hypertension.  Diagnoses and all orders for this visit:  Essential  hypertension- Her blood pressure is adequately well controlled. -     Hepatic function panel  Zinc deficiency- Her zinc notable level is normal now. -     Zinc; Future -     Zinc  Acquired hemolytic anemia ([place])- Her anemia has improved slightly.  She has a normal LDH.  I do not see any evidence of significant hemolysis. -     CBC with Differential/Platelet -     Hepatic function panel -  Lactate dehydrogenase  Iron deficiency anemia due to chronic blood loss- Her H&H have improved some.  Her iron level is normal.  She has an elevated ferritin level so I think she also has the anemia of chronic disease.  I recommended that she take an iron supplement. -     IBC panel -     CBC with Differential/Platelet -     Ferritin -     ferrous sulfate 325 (65 FE) MG tablet; Take 1 tablet (325 mg total) by mouth 2 (two) times daily with a meal.   I am having Claudia Lara maintain her vitamin C, cholecalciferol, zinc gluconate, Calcium Carb-Cholecalciferol (CALCIUM CARBONATE-VITAMIN D3 PO), multivitamin with minerals, carvedilol, traMADol, gabapentin, docusate sodium, torsemide, pantoprazole, and ferrous sulfate.  Meds ordered this encounter  Medications  . ferrous sulfate 325 (65 FE) MG tablet    Sig: Take 1 tablet (325 mg total) by mouth 2 (two) times daily with a meal.    Dispense:  90 tablet    Refill:  1     Follow-up: Return in about 3 months (around 03/13/2020).  Claudia Calico, MD

## 2019-12-15 LAB — LACTATE DEHYDROGENASE: LDH: 170 U/L (ref 120–250)

## 2019-12-15 LAB — ZINC: Zinc: 83 ug/dL (ref 60–130)

## 2019-12-16 ENCOUNTER — Encounter: Payer: Self-pay | Admitting: Internal Medicine

## 2019-12-19 NOTE — Progress Notes (Signed)
Cardiology Office Note   Date:  12/20/2019   ID:  Claudia Lara, Claudia Lara Aug 28, 1932, MRN [id number]  PCP:  Janith Lima, MD  Cardiologist:   Dorris Carnes, MD    F/U of dyspnea     History of Present Illness:    Patient is an 86 yo with hx of dyspnea, Diastolic dysfunction, GERD, aortic sclerosis   I saw her in 2019   Echo showed Gr II diastolic dysfunction with increased filling pressures.  PFTs done that showed decreased DLCO consistent with vascular process.    She has been seen in pulmonary clinic by Dr Wonda Amis  CT showed minimal ground glass as well as a small nodule RUL that is being followed.   She ws last seen in cardiology clinic by B Bhagat.   Since seen she says her breathing may be a tad better.   She doesn't move fast  Due to DJD   Had L knee surgery   Still having problems    She denies CP    Followed by Dr Ronnald Ramp in IM   Hx anemia  Getting infusions    Outpatient Medications Prior to Visit  Medication Sig Dispense Refill  . Calcium Carb-Cholecalciferol (CALCIUM CARBONATE-VITAMIN D3 PO) Take 1 tablet by mouth daily.     . carvedilol (COREG) 12.5 MG tablet TAKE 1 TABLET (12.5 MG TOTAL) BY MOUTH 2 (TWO) TIMES DAILY WITH A MEAL. 180 tablet 1  . cholecalciferol (VITAMIN D) 1000 units tablet Take 1,000 Units by mouth daily.    . ferrous sulfate 325 (65 FE) MG tablet Take 1 tablet (325 mg total) by mouth 2 (two) times daily with a meal. 90 tablet 1  . Multiple Vitamin (MULTIVITAMIN WITH MINERALS) TABS tablet Take 1 tablet by mouth daily.    . pantoprazole (PROTONIX) 40 MG tablet TAKE 1 TABLET BY MOUTH EVERY DAY 90 tablet 1  . torsemide (DEMADEX) 20 MG tablet TAKE 1 TABLET BY MOUTH EVERY DAY 90 tablet 1  . traMADol (ULTRAM) 50 MG tablet Take 1-2 tablets (50-100 mg total) by mouth every 12 (twelve) hours as needed for moderate pain. 56 tablet 0  . vitamin C (ASCORBIC ACID) 500 MG tablet Take 500 mg by mouth daily.    [name] docusate sodium (COLACE) 100 MG capsule Take 1 capsule (100 mg  total) by mouth daily as needed. (Patient not taking: Reported on 12/20/2019) 30 capsule 2  . gabapentin (NEURONTIN) 100 MG capsule Take 1 capsule (100 mg total) by mouth 3 (three) times daily. 1 tab 3x/day for 2 weeks. 1 tab 2x/day for 2 weeks. 1 tab a day for 2 weeks (Patient not taking: Reported on 11/09/2019) 84 capsule 0  . zinc gluconate 50 MG tablet Take 1 tablet (50 mg total) daily by mouth. (Patient not taking: Reported on 12/14/2019) 90 tablet 1   No facility-administered medications prior to visit.     Allergies:   Sulfonamide derivatives   Past Medical History:  Diagnosis Date  . ANEMIA 08/22/2009  . Aortic stenosis   . AR (aortic regurgitation) 01/2019   Mild to Moderate, Noted on ECHO  . CAROTID ARTERY STENOSIS 01/16/2010  . Complication of anesthesia    very hard to wake up from surgery  . DYSPNEA ON EXERTION 11/16/2007  . Gallstones   . GEN OSTEOARTHROSIS INVOLVING MULTIPLE SITES 11/11/2008  . GERD (gastroesophageal reflux disease)   . Grade I diastolic dysfunction 123456   Noted on ECHO  . Heart murmur   .  HEMORRHOIDS, INTERNAL    surgery was in the 90's (per patient)  . History of blood transfusion    after hip and knee replacements  . NECK PAIN, ACUTE 12/28/2009  . PERIPHERAL EDEMA 10/06/2007  . PONV (postoperative nausea and vomiting)   . Pulmonary nodule 06/18/2019   noted on CT Chest   . PVD 10/06/2007  . Unspecified essential hypertension 10/19/2007    Past Surgical History:  Procedure Laterality Date  . ABDOMINAL HYSTERECTOMY    . BIOPSY  08/06/2019   Procedure: BIOPSY;  Surgeon: Gatha Mayer, MD;  Location: [facility];  Service: Gastroenterology;;  . [name]     bilat  . CARPAL TUNNEL RELEASE    . CHOLECYSTECTOMY  01/29/2016   at [facility]  . CHOLECYSTECTOMY    . ENDOSCOPIC RETROGRADE CHOLANGIOPANCREATOGRAPHY (ERCP) WITH PROPOFOL N/A 12/01/2015   Procedure: ENDOSCOPIC RETROGRADE CHOLANGIOPANCREATOGRAPHY (ERCP) WITH  PROPOFOL;  Surgeon: [name], MD;  Location: [facility];  Service: Endoscopy;  Laterality: N/A;  . ENDOSCOPIC RETROGRADE CHOLANGIOPANCREATOGRAPHY (ERCP) WITH PROPOFOL N/A 02/01/2016   Procedure: ENDOSCOPIC RETROGRADE CHOLANGIOPANCREATOGRAPHY (ERCP) WITH PROPOFOL;  Surgeon: [name], MD;  Location: [facility];  Service: Endoscopy;  Laterality: N/A;  . ENDOSCOPIC RETROGRADE CHOLANGIOPANCREATOGRAPHY (ERCP) WITH PROPOFOL N/A 04/07/2018   Procedure: ENDOSCOPIC RETROGRADE CHOLANGIOPANCREATOGRAPHY (ERCP) WITH PROPOFOL;  Surgeon: [name], MD;  Location: [facility];  Service: Endoscopy;  Laterality: N/A;  . ESOPHAGOGASTRODUODENOSCOPY (EGD) WITH PROPOFOL N/A 08/06/2019   Procedure: ESOPHAGOGASTRODUODENOSCOPY (EGD) WITH PROPOFOL;  Surgeon: Gatha Mayer, MD;  Location: [facility];  Service: Gastroenterology;  Laterality: N/A;  . HAMMER TOE SURGERY    . HEMORRHOID SURGERY    . Hyperplastic colon polyps, removed  2007   By Dr. Penelope Coop  . JOINT REPLACEMENT  2011   right knee  . OOPHORECTOMY    . REMOVAL OF STONES  04/07/2018   Procedure: REMOVAL OF STONES;  Surgeon: [name], MD;  Location: [facility];  Service: Endoscopy;;  . ROTATOR CUFF REPAIR  2004   left (Dr. Durward Fortes)  . SPHINCTEROTOMY  04/07/2018   Procedure: SPHINCTEROTOMY;  Surgeon: [name], MD;  Location: [facility];  Service: Endoscopy;;  . TOTAL HIP ARTHROPLASTY Right 11/26/2016   Procedure: RIGHT TOTAL HIP ARTHROPLASTY;  Surgeon: Garald Balding, MD;  Location: [facility];  Service: Orthopedics;  Laterality: Right;  . TOTAL KNEE ARTHROPLASTY Right   . TOTAL KNEE ARTHROPLASTY Left 07/26/2019   Procedure: TOTAL KNEE ARTHROPLASTY;  Surgeon: Gaynelle Arabian, MD;  Location: [facility];  Service: Orthopedics;  Laterality: Left;  37min     Social History:  The patient  reports that she has never smoked. She has never used smokeless tobacco. She reports that she does not drink alcohol or use drugs.   Family  History:  The patient's family history includes Cancer in her brother, father, and mother; Leukemia in her father; Lung cancer in her father; Ovarian cancer in her mother.    ROS:  Please see the history of present illness. All other systems are reviewed and  Negative to the above problem except as noted.    PHYSICAL EXAM: VS:  BP (!) 130/52   Pulse 70   Ht 5' 2.9" (1.598 m)   Wt 164 lb (74.4 kg)   SpO2 97%   BMI 29.14 kg/m   GEN: Overweight 86 yo  in no acute distress  HEENT: normal  Neck: no JVD Cardiac: RRR; Gr I/VI systolic murmur  ,Tr edema  Respiratory:  clear to auscultation bilaterally, normal work of breathing GI: soft, nontender, nondistended, + BS  No hepatomegaly  MS: no deformity Moving all extremities   Skin: warm and dry, no rash Neuro:  Strength and sensation are intact Psych: euthymic mood, full affect?   EKG:  EKG is not  ordered today.  Lipid Panel    Component Value Date/Time   CHOL 120 06/08/2019 1109   CHOL 123 08/14/2017 1143   TRIG 104.0 06/08/2019 1109   HDL 53.90 06/08/2019 1109   HDL 64 08/14/2017 1143   CHOLHDL 2 06/08/2019 1109   VLDL 20.8 06/08/2019 1109   LDLCALC 45 06/08/2019 1109   LDLCALC 45 08/14/2017 1143   LDLDIRECT 56.0 07/08/2018 1518      Wt Readings from Last 3 Encounters:  12/20/19 164 lb (74.4 kg)  12/14/19 164 lb 3.2 oz (74.5 kg)  09/21/19 163 lb (73.9 kg)      ASSESSMENT AND PLAN: 1  Dyspnea  Prob multifactoral   Pt has diastolic dysfunction.  Overall volume status does not appear too bad  Watch salt  She also is anemic which may be contributing.  Continues to follow in pulmonary     2  Aortic valve dz  Mild sclerosis  I do not think there is signif stenosis   3  CV dz  Mild plaquing in 2018  4  LIpids  Excellent despite no statin    LDL 45  HDL 54    5  Anemia  Follows in IM     Plan for f/u at the end of August     Current medicines are reviewed at length with the patient today.  The patient does not have  concerns regarding medicines.  Signed, Dorris Carnes, MD  12/20/2019 1:52 PM    [facility] [address], [place], [place]  [address] Phone: [phone]; Fax: [phone]

## 2019-12-20 ENCOUNTER — Ambulatory Visit: Payer: Medicare Other | Admitting: Internal Medicine

## 2019-12-20 ENCOUNTER — Encounter: Payer: Self-pay | Admitting: Internal Medicine

## 2019-12-20 ENCOUNTER — Other Ambulatory Visit: Payer: Self-pay

## 2019-12-20 VITALS — BP 130/52 | HR 70 | Ht 62.9 in | Wt 164.0 lb

## 2019-12-20 DIAGNOSIS — R0600 Dyspnea, unspecified: Secondary | ICD-10-CM

## 2019-12-20 DIAGNOSIS — E782 Mixed hyperlipidemia: Secondary | ICD-10-CM | POA: Diagnosis not present

## 2019-12-20 DIAGNOSIS — D508 Other iron deficiency anemias: Secondary | ICD-10-CM | POA: Diagnosis not present

## 2019-12-20 NOTE — Patient Instructions (Signed)
Medication Instructions:  No changes *If you need a refill on your cardiac medications before your next appointment, please call your pharmacy*  Lab Work: none If you have labs (blood work) drawn today and your tests are completely normal, you will receive your results only by: [name] MyChart Message (if you have MyChart) OR . A paper copy in the mail If you have any lab test that is abnormal or we need to change your treatment, we will call you to review the results.  Testing/Procedures: none  Follow-Up: At [facility], you and your health needs are our priority.  As part of our continuing mission to provide you with exceptional heart care, we have created designated Provider Care Teams.  These Care Teams include your primary Cardiologist (physician) and Advanced Practice Providers (APPs -  Physician Assistants and Nurse Practitioners) who all work together to provide you with the care you need, when you need it.  Your next appointment:   7 month(s)  The format for your next appointment:   Either In Person or Virtual  Provider:   Dorris Carnes, MD  Other Instructions

## 2019-12-30 ENCOUNTER — Ambulatory Visit (INDEPENDENT_AMBULATORY_CARE_PROVIDER_SITE_OTHER): Payer: Medicare Other | Admitting: Internal Medicine

## 2019-12-30 ENCOUNTER — Other Ambulatory Visit: Payer: Self-pay

## 2019-12-30 ENCOUNTER — Encounter: Payer: Self-pay | Admitting: Internal Medicine

## 2019-12-30 VITALS — BP 160/60 | HR 75 | Temp 98.7°F | Ht 62.9 in | Wt 164.0 lb

## 2019-12-30 DIAGNOSIS — R3989 Other symptoms and signs involving the genitourinary system: Secondary | ICD-10-CM | POA: Diagnosis not present

## 2019-12-30 DIAGNOSIS — N3000 Acute cystitis without hematuria: Secondary | ICD-10-CM | POA: Diagnosis not present

## 2019-12-30 DIAGNOSIS — D599 Acquired hemolytic anemia, unspecified: Secondary | ICD-10-CM | POA: Diagnosis not present

## 2019-12-30 DIAGNOSIS — D500 Iron deficiency anemia secondary to blood loss (chronic): Secondary | ICD-10-CM

## 2019-12-30 DIAGNOSIS — R7989 Other specified abnormal findings of blood chemistry: Secondary | ICD-10-CM | POA: Insufficient documentation

## 2019-12-30 DIAGNOSIS — I10 Essential (primary) hypertension: Secondary | ICD-10-CM | POA: Diagnosis not present

## 2019-12-30 DIAGNOSIS — N1832 Chronic kidney disease, stage 3b: Secondary | ICD-10-CM

## 2019-12-30 LAB — CBC WITH DIFFERENTIAL/PLATELET
Basophils Absolute: 0 10*3/uL (ref 0.0–0.1)
Basophils Relative: 0.4 % (ref 0.0–3.0)
Eosinophils Absolute: 0.2 10*3/uL (ref 0.0–0.7)
Eosinophils Relative: 2.9 % (ref 0.0–5.0)
HCT: 26.1 % — ABNORMAL LOW (ref 36.0–46.0)
Hemoglobin: 9.1 g/dL — ABNORMAL LOW (ref 12.0–15.0)
Lymphocytes Relative: 11.3 % — ABNORMAL LOW (ref 12.0–46.0)
Lymphs Abs: 0.8 10*3/uL (ref 0.7–4.0)
MCHC: 34.8 g/dL (ref 30.0–36.0)
MCV: 90.3 fl (ref 78.0–100.0)
Monocytes Absolute: 0.7 10*3/uL (ref 0.1–1.0)
Monocytes Relative: 10.5 % (ref 3.0–12.0)
Neutro Abs: 5.1 10*3/uL (ref 1.4–7.7)
Neutrophils Relative %: 74.9 % (ref 43.0–77.0)
Platelets: 170 10*3/uL (ref 150.0–400.0)
RBC: 2.89 Mil/uL — ABNORMAL LOW (ref 3.87–5.11)
RDW: 20 % — ABNORMAL HIGH (ref 11.5–15.5)
WBC: 6.8 10*3/uL (ref 4.0–10.5)

## 2019-12-30 LAB — BASIC METABOLIC PANEL
BUN: 30 mg/dL — ABNORMAL HIGH (ref 6–23)
CO2: 27 mEq/L (ref 19–32)
Calcium: 9.1 mg/dL (ref 8.4–10.5)
Chloride: 105 mEq/L (ref 96–112)
Creatinine, Ser: 1.68 mg/dL — ABNORMAL HIGH (ref 0.40–1.20)
GFR: 28.9 mL/min — ABNORMAL LOW (ref >60.00)
Glucose, Bld: 109 mg/dL — ABNORMAL HIGH (ref 70–99)
Potassium: 4 mEq/L (ref 3.5–5.1)
Sodium: 141 mEq/L (ref 135–145)

## 2019-12-30 LAB — POC URINALSYSI DIPSTICK (AUTOMATED)
Bilirubin, UA: NEGATIVE
Blood, UA: NEGATIVE
Glucose, UA: NEGATIVE
Ketones, UA: NEGATIVE
Nitrite, UA: NEGATIVE
Protein, UA: POSITIVE — AB
Spec Grav, UA: 1.015 (ref 1.010–1.025)
Urobilinogen, UA: 0.2 E.U./dL
pH, UA: 6 (ref 5.0–8.0)

## 2019-12-30 LAB — HEPATIC FUNCTION PANEL
ALT: 280 U/L — ABNORMAL HIGH (ref 0–35)
AST: 116 U/L — ABNORMAL HIGH (ref 0–37)
Albumin: 4.2 g/dL (ref 3.5–5.2)
Alkaline Phosphatase: 125 U/L — ABNORMAL HIGH (ref 39–117)
Bilirubin, Direct: 1.3 mg/dL — ABNORMAL HIGH (ref 0.0–0.3)
Total Bilirubin: 3.9 mg/dL — ABNORMAL HIGH (ref 0.2–1.2)
Total Protein: 6.5 g/dL (ref 6.0–8.3)

## 2019-12-30 MED ORDER — NITROFURANTOIN MONOHYD MACRO 100 MG PO CAPS: 100.0000 mg | ORAL_CAPSULE | Freq: Two times a day (BID) | ORAL | 0 refills | Status: AC

## 2019-12-30 NOTE — Progress Notes (Signed)
Subjective:  Patient ID: Claudia Lara, female    DOB: [birth year]  Age: 86 y.o. MRN: [id number]  CC: Urinary Tract Infection  This visit occurred during the SARS-CoV-2 public health emergency.  Safety protocols were in place, including screening questions prior to the visit, additional usage of staff PPE, and extensive cleaning of exam room while observing appropriate contact time as indicated for disinfecting solutions.   Claudia Lara presents for f/up - She complains of a 3-day history of dysuria, urgency, and frequency.  She also complains that she she is turning yellow and her urine is orange.  She has a history of idiopathic hemolytic anemia as well as iron deficiency anemia.  She underwent an iron infusion several months ago.  Her last iron level was 89 about 2 weeks ago.  She denies nausea, vomiting, abdominal pain, fever, chills,  Outpatient Medications Prior to Visit  Medication Sig Dispense Refill  . Calcium Carb-Cholecalciferol (CALCIUM CARBONATE-VITAMIN D3 PO) Take 1 tablet by mouth daily.     . carvedilol (COREG) 12.5 MG tablet TAKE 1 TABLET (12.5 MG TOTAL) BY MOUTH 2 (TWO) TIMES DAILY WITH A MEAL. 180 tablet 1  . cholecalciferol (VITAMIN D) 1000 units tablet Take 1,000 Units by mouth daily.    . ferrous sulfate 325 (65 FE) MG tablet Take 1 tablet (325 mg total) by mouth 2 (two) times daily with a meal. 90 tablet 1  . Multiple Vitamin (MULTIVITAMIN WITH MINERALS) TABS tablet Take 1 tablet by mouth daily.    . pantoprazole (PROTONIX) 40 MG tablet TAKE 1 TABLET BY MOUTH EVERY DAY 90 tablet 1  . torsemide (DEMADEX) 20 MG tablet TAKE 1 TABLET BY MOUTH EVERY DAY 90 tablet 1  . traMADol (ULTRAM) 50 MG tablet Take 1-2 tablets (50-100 mg total) by mouth every 12 (twelve) hours as needed for moderate pain. 56 tablet 0  . vitamin C (ASCORBIC ACID) 500 MG tablet Take 500 mg by mouth daily.     No facility-administered medications prior to visit.    ROS Review of Systems    Constitutional: Negative for appetite change, chills, diaphoresis, fatigue and fever.  HENT: Negative.   Eyes: Negative for visual disturbance.  Respiratory: Negative for cough, chest tightness, shortness of breath and wheezing.   Cardiovascular: Negative for chest pain, palpitations and leg swelling.  Gastrointestinal: Negative for abdominal pain, blood in stool, constipation, diarrhea, nausea and vomiting.  Endocrine: Negative.   Genitourinary: Positive for dysuria, frequency and urgency. Negative for decreased urine volume, difficulty urinating and hematuria.  Musculoskeletal: Negative.  Negative for arthralgias and myalgias.  Skin: Positive for color change. Negative for pallor and rash.  Neurological: Negative.  Negative for dizziness, weakness, light-headedness and headaches.  Hematological: Negative for adenopathy. Does not bruise/bleed easily.  Psychiatric/Behavioral: Negative.     Objective:  BP (!) 160/60 (BP Location: Left Arm, Patient Position: Sitting, Cuff Size: Normal)   Pulse 75   Temp 98.7 F (37.1 C) (Oral)   Ht 5' 2.9" (1.598 m)   Wt 164 lb (74.4 kg)   SpO2 99%   BMI 29.14 kg/m   BP Readings from Last 3 Encounters:  12/30/19 (!) 160/60  12/20/19 (!) 130/52  12/14/19 (!) 152/62    Wt Readings from Last 3 Encounters:  12/30/19 164 lb (74.4 kg)  12/20/19 164 lb (74.4 kg)  12/14/19 164 lb 3.2 oz (74.5 kg)    Physical Exam Constitutional:      Appearance: Normal appearance.  HENT:  Nose: Nose normal.     Mouth/Throat:     Mouth: Mucous membranes are moist.  Eyes:     General: Scleral icterus present.  Cardiovascular:     Rate and Rhythm: Normal rate and regular rhythm.     Heart sounds: Murmur present. Systolic murmur present with a grade of 2/6. No diastolic murmur. No gallop.   Pulmonary:     Effort: Pulmonary effort is normal.     Breath sounds: No stridor. No wheezing, rhonchi or rales.  Abdominal:     General: Abdomen is flat. There is no  distension.     Palpations: There is no mass.     Tenderness: There is no abdominal tenderness. There is no guarding.  Musculoskeletal:     Cervical back: Neck supple.     Right lower leg: No edema.     Left lower leg: No edema.  Lymphadenopathy:     Cervical: No cervical adenopathy.  Skin:    General: Skin is warm and dry.  Neurological:     General: No focal deficit present.     Mental Status: She is alert.  Psychiatric:        Mood and Affect: Mood normal.        Behavior: Behavior normal.     Lab Results  Component Value Date   WBC 6.8 12/30/2019   HGB 9.1 (L) 12/30/2019   HCT 26.1 Repeated and verified X2. (L) 12/30/2019   PLT 170.0 12/30/2019   GLUCOSE 109 (H) 12/30/2019   CHOL 120 06/08/2019   TRIG 104.0 06/08/2019   HDL 53.90 06/08/2019   LDLDIRECT 56.0 07/08/2018   LDLCALC 45 06/08/2019   ALT 280 (H) 12/30/2019   AST 116 (H) 12/30/2019   NA 141 12/30/2019   K 4.0 12/30/2019   CL 105 12/30/2019   CREATININE 1.68 (H) 12/30/2019   BUN 30 (H) 12/30/2019   CO2 27 12/30/2019   TSH 0.797 08/31/2019   INR 1.1 07/21/2019   HGBA1C 4.8 06/08/2019    MR ABDOMEN WO CONTRAST  Result Date: 12/31/2019 CLINICAL DATA:  Hepatitis. Renal insufficiency precludes administration of intravenous gadolinium contrast. EXAM: MRI ABDOMEN WITHOUT CONTRAST TECHNIQUE: Multiplanar multisequence MR imaging was performed without the administration of intravenous contrast. COMPARISON:  04/03/2018 FINDINGS: Study limited by motion artifact. Lower chest: Unremarkable. Hepatobiliary: No focal abnormality identified in the liver parenchyma although motion degradation could obscure small lesions. Signal intensity in the liver parenchyma is decreased on T2 imaging. There is also a diffuse drop of signal intensity in the liver parenchyma on inphase T1 imaging. These features suggest iron deposition within the liver parenchyma as seen in iron overload syndrome. Gallbladder surgically absent. No intra or  extrahepatic biliary duct dilatation. No choledocholithiasis. Pancreas: No focal mass lesion. No dilatation of the main duct. No intraparenchymal cyst. No peripancreatic edema. Spleen: Intensity spleen also demonstrates markedly on low signal T2 and in phase T1 imaging suggesting iron deposition. Adrenals/Urinary Tract: 13 mm left adrenal nodule is stable since CT of 03/17/2018 suggesting benign etiology. Due to motion this cannot be definitively characterized on the in and out of phase T1 imaging. Kidneys unremarkable. Stomach/Bowel: Small hiatal hernia stomach otherwise unremarkable. Duodenum is normally positioned as is the ligament of Treitz. No small bowel or colonic dilatation within the visualized abdomen Vascular/Lymphatic: No abdominal aortic aneurysm. No abdominal lymphadenopathy Other:  No intraperitoneal free fluid. Musculoskeletal: No suspicious marrow signal abnormality with degenerative disc disease noted in the lumbar spine. IMPRESSION: 1. Study limited by  motion and inability to give intravenous gadolinium secondary to renal insufficiency. 2. Diffuse loss of signal intensity in the liver and spleen on T2 imaging and inphase T1 imaging suggests iron deposition consistent with an iron overload syndrome. Involvement of both the liver and spleen suggests a secondary form. 3. 13 mm left adrenal nodule cannot be definitively characterized but is stable since 03/17/2018 suggesting benign etiology. 4. Small hiatal hernia. Electronically Signed   By: Misty Stanley M.D.   On: 12/31/2019 19:58    Assessment & Plan:   Fajr was seen today for urinary tract infection.  Diagnoses and all orders for this visit:  Acquired hemolytic anemia ([place])- Her H&H are stable and her LDH is normal.  I do not think her symptoms are related to hemolytic anemia. -     CBC with Differential/Platelet -     Hepatic function panel -     Lactate dehydrogenase  Iron deficiency anemia due to chronic blood loss-the MRI of  her abdomen is suggestive of iron overload.  I have asked her to stop taking an iron supplement. -     CBC with Differential/Platelet  Abnormal urine color-she has leukocytes in her urine.  I will empirically treat her for acute cystitis.  The urine culture is pending. -     POCT Urinalysis Dipstick (Automated) -     CULTURE, URINE COMPREHENSIVE; Future -     CULTURE, URINE COMPREHENSIVE  Essential hypertension-her blood pressure is adequately well controlled. -     Basic metabolic panel -     Hepatic function panel  Acute cystitis without hematuria -     nitrofurantoin, macrocrystal-monohydrate, (MACROBID) 100 MG capsule; Take 1 capsule (100 mg total) by mouth 2 (two) times daily for 5 days.  Stage 3b chronic kidney disease-her blood pressure is adequately well controlled.  She agrees to avoid nephrotoxic agents.  Elevated LFTs- Her LFTs are mildly elevated.  Her H&H are stable.  MRI is suggestive of iron overload but does not show any retained stones, mass, obstructive lesion, or evidence of biliary cirrhosis.  I have asked her to stop taking the iron supplement and to return in 3 to 5 days to recheck her LFTs and to monitor her iron levels. -     Cancel: MR Abdomen W Wo Contrast; Future -     Cancel: MR Abdomen W Wo Contrast; Future   I am having Claudia Lara start on nitrofurantoin (macrocrystal-monohydrate). I am also having her maintain her vitamin C, cholecalciferol, Calcium Carb-Cholecalciferol (CALCIUM CARBONATE-VITAMIN D3 PO), multivitamin with minerals, carvedilol, traMADol, torsemide, pantoprazole, and ferrous sulfate.  Meds ordered this encounter  Medications  . nitrofurantoin, macrocrystal-monohydrate, (MACROBID) 100 MG capsule    Sig: Take 1 capsule (100 mg total) by mouth 2 (two) times daily for 5 days.    Dispense:  10 capsule    Refill:  0     Follow-up: Return in about 2 weeks (around 01/13/2020).  [name], MD

## 2019-12-30 NOTE — Patient Instructions (Signed)

## 2019-12-31 ENCOUNTER — Ambulatory Visit (HOSPITAL_COMMUNITY)
Admission: RE | Admit: 2019-12-31 | Discharge: 2019-12-31 | Disposition: A | Discharge status: Home / Self Care | Payer: Medicare Other | Source: Ambulatory Visit | Attending: Internal Medicine | Admitting: Internal Medicine

## 2019-12-31 ENCOUNTER — Other Ambulatory Visit: Payer: Self-pay | Admitting: Internal Medicine

## 2019-12-31 DIAGNOSIS — R7989 Other specified abnormal findings of blood chemistry: Secondary | ICD-10-CM

## 2019-12-31 DIAGNOSIS — K449 Diaphragmatic hernia without obstruction or gangrene: Secondary | ICD-10-CM | POA: Diagnosis not present

## 2019-12-31 LAB — LACTATE DEHYDROGENASE: LDH: 164 U/L (ref 120–250)

## 2019-12-31 NOTE — Telephone Encounter (Signed)
Pt contacted and informed of results and stated that she will do the MRI today.

## 2020-01-01 ENCOUNTER — Encounter: Payer: Self-pay | Admitting: Internal Medicine

## 2020-01-02 ENCOUNTER — Encounter: Payer: Self-pay | Admitting: Internal Medicine

## 2020-01-02 LAB — CULTURE, URINE COMPREHENSIVE

## 2020-01-05 ENCOUNTER — Other Ambulatory Visit: Payer: Medicare Other

## 2020-01-05 ENCOUNTER — Ambulatory Visit (INDEPENDENT_AMBULATORY_CARE_PROVIDER_SITE_OTHER): Payer: Medicare Other | Admitting: Internal Medicine

## 2020-01-05 ENCOUNTER — Other Ambulatory Visit: Payer: Self-pay

## 2020-01-05 ENCOUNTER — Encounter: Payer: Self-pay | Admitting: Internal Medicine

## 2020-01-05 VITALS — BP 142/56 | HR 74 | Temp 97.8°F | Resp 16 | Ht 62.9 in | Wt 164.0 lb

## 2020-01-05 DIAGNOSIS — R7989 Other specified abnormal findings of blood chemistry: Secondary | ICD-10-CM | POA: Diagnosis not present

## 2020-01-05 DIAGNOSIS — N1832 Chronic kidney disease, stage 3b: Secondary | ICD-10-CM | POA: Diagnosis not present

## 2020-01-05 DIAGNOSIS — D500 Iron deficiency anemia secondary to blood loss (chronic): Secondary | ICD-10-CM

## 2020-01-05 LAB — CBC WITH DIFFERENTIAL/PLATELET
Basophils Absolute: 0.1 10*3/uL (ref 0.0–0.1)
Basophils Relative: 0.7 % (ref 0.0–3.0)
Eosinophils Absolute: 0.2 10*3/uL (ref 0.0–0.7)
Eosinophils Relative: 2.6 % (ref 0.0–5.0)
HCT: 28 % — ABNORMAL LOW (ref 36.0–46.0)
Hemoglobin: 9.5 g/dL — ABNORMAL LOW (ref 12.0–15.0)
Lymphocytes Relative: 16.4 % (ref 12.0–46.0)
Lymphs Abs: 1.5 10*3/uL (ref 0.7–4.0)
MCHC: 33.8 g/dL (ref 30.0–36.0)
MCV: 93.3 fl (ref 78.0–100.0)
Monocytes Absolute: 0.8 10*3/uL (ref 0.1–1.0)
Monocytes Relative: 9 % (ref 3.0–12.0)
Neutro Abs: 6.3 10*3/uL (ref 1.4–7.7)
Neutrophils Relative %: 71.3 % (ref 43.0–77.0)
Platelets: 249 10*3/uL (ref 150.0–400.0)
RBC: 3 Mil/uL — ABNORMAL LOW (ref 3.87–5.11)
RDW: 20 % — ABNORMAL HIGH (ref 11.5–15.5)
WBC: 8.9 10*3/uL (ref 4.0–10.5)

## 2020-01-05 LAB — HEPATIC FUNCTION PANEL
ALT: 47 U/L — ABNORMAL HIGH (ref 0–35)
AST: 21 U/L (ref 0–37)
Albumin: 4.4 g/dL (ref 3.5–5.2)
Alkaline Phosphatase: 102 U/L (ref 39–117)
Bilirubin, Direct: 0.5 mg/dL — ABNORMAL HIGH (ref 0.0–0.3)
Total Bilirubin: 2 mg/dL — ABNORMAL HIGH (ref 0.2–1.2)
Total Protein: 6.9 g/dL (ref 6.0–8.3)

## 2020-01-05 LAB — PROTIME-INR
INR: 1.1 ratio — ABNORMAL HIGH (ref 0.8–1.0)
Prothrombin Time: 12.5 s (ref 9.6–13.1)

## 2020-01-05 LAB — BASIC METABOLIC PANEL
BUN: 30 mg/dL — ABNORMAL HIGH (ref 6–23)
CO2: 25 mEq/L (ref 19–32)
Calcium: 9.4 mg/dL (ref 8.4–10.5)
Chloride: 108 mEq/L (ref 96–112)
Creatinine, Ser: 1.47 mg/dL — ABNORMAL HIGH (ref 0.40–1.20)
GFR: 33.72 mL/min — ABNORMAL LOW (ref >60.00)
Glucose, Bld: 106 mg/dL — ABNORMAL HIGH (ref 70–99)
Potassium: 5 mEq/L (ref 3.5–5.1)
Sodium: 139 mEq/L (ref 135–145)

## 2020-01-05 LAB — IBC PANEL
Iron: 85 ug/dL (ref 42–145)
Saturation Ratios: 42.5 % (ref 20.0–50.0)
Transferrin: 143 mg/dL — ABNORMAL LOW (ref 212.0–360.0)

## 2020-01-05 LAB — FERRITIN: Ferritin: 1040.7 ng/mL — ABNORMAL HIGH (ref 10.0–291.0)

## 2020-01-05 NOTE — Progress Notes (Signed)
Subjective:  Patient ID: Claudia Lara, female    DOB: 1932/01/21  Age: 86 y.o. MRN: [id number]  CC: Anemia  This visit occurred during the SARS-CoV-2 public health emergency.  Safety protocols were in place, including screening questions prior to the visit, additional usage of staff PPE, and extensive cleaning of exam room while observing appropriate contact time as indicated for disinfecting solutions.    HPI Claudia Lara presents for f/up - She returns after is being seen about 5 days ago.  At that time she complained of dark urine and dysuria.  Her labs showed elevated liver enzymes and a slight decline in her renal function.  Her chronic anemia was stable.  Testing for hemolysis was negative.  She eventually underwent an MRI of the abdomen that showed concerns for iron overload but no other specific findings were noted.  She has been notified to stop taking her iron supplement which she has done.  She tells me she feels much better today.  Her urine is of normal color and she feels well.  Outpatient Medications Prior to Visit  Medication Sig Dispense Refill  . Calcium Carb-Cholecalciferol (CALCIUM CARBONATE-VITAMIN D3 PO) Take 1 tablet by mouth daily.     . carvedilol (COREG) 12.5 MG tablet TAKE 1 TABLET (12.5 MG TOTAL) BY MOUTH 2 (TWO) TIMES DAILY WITH A MEAL. 180 tablet 1  . cholecalciferol (VITAMIN D) 1000 units tablet Take 1,000 Units by mouth daily.    . Multiple Vitamin (MULTIVITAMIN WITH MINERALS) TABS tablet Take 1 tablet by mouth daily.    . pantoprazole (PROTONIX) 40 MG tablet TAKE 1 TABLET BY MOUTH EVERY DAY 90 tablet 1  . torsemide (DEMADEX) 20 MG tablet TAKE 1 TABLET BY MOUTH EVERY DAY 90 tablet 1  . traMADol (ULTRAM) 50 MG tablet Take 1-2 tablets (50-100 mg total) by mouth every 12 (twelve) hours as needed for moderate pain. 56 tablet 0  . vitamin C (ASCORBIC ACID) 500 MG tablet Take 500 mg by mouth daily.    . ferrous sulfate 325 (65 FE) MG tablet Take 1 tablet (325 mg  total) by mouth 2 (two) times daily with a meal. (Patient not taking: Reported on 01/05/2020) 90 tablet 1   No facility-administered medications prior to visit.    ROS Review of Systems  Constitutional: Negative for appetite change, diaphoresis, fatigue, fever and unexpected weight change.  HENT: Negative.   Eyes: Negative for visual disturbance.  Respiratory: Negative for cough, chest tightness, shortness of breath and wheezing.   Cardiovascular: Negative for chest pain.  Gastrointestinal: Negative for abdominal pain, constipation, diarrhea, nausea and vomiting.  Endocrine: Negative.   Genitourinary: Negative.  Negative for difficulty urinating.  Musculoskeletal: Negative.   Skin: Negative.   Neurological: Negative.  Negative for dizziness, weakness and headaches.  Hematological: Negative.  Negative for adenopathy. Does not bruise/bleed easily.  Psychiatric/Behavioral: Negative.     Objective:  BP (!) 142/56 (BP Location: Left Arm, Patient Position: Sitting, Cuff Size: Normal)   Pulse 74   Temp 97.8 F (36.6 C) (Oral)   Resp 16   Ht 5' 2.9" (1.598 m)   Wt 164 lb (74.4 kg)   SpO2 98%   BMI 29.14 kg/m   BP Readings from Last 3 Encounters:  01/05/20 (!) 142/56  12/30/19 (!) 160/60  12/20/19 (!) 130/52    Wt Readings from Last 3 Encounters:  01/05/20 164 lb (74.4 kg)  12/30/19 164 lb (74.4 kg)  12/20/19 164 lb (74.4 kg)  Physical Exam Vitals reviewed.  Constitutional:      Appearance: Normal appearance.  HENT:     Nose: Nose normal.     Mouth/Throat:     Mouth: Mucous membranes are moist.  Eyes:     General: No scleral icterus.    Conjunctiva/sclera: Conjunctivae normal.  Cardiovascular:     Rate and Rhythm: Normal rate and regular rhythm.     Heart sounds: Murmur present.  Pulmonary:     Effort: Pulmonary effort is normal.     Breath sounds: No stridor. No wheezing, rhonchi or rales.  Abdominal:     General: Abdomen is protuberant. Bowel sounds are  normal. There is no distension.     Palpations: Abdomen is soft. There is no hepatomegaly or splenomegaly.     Tenderness: There is no abdominal tenderness.  Musculoskeletal:        General: Normal range of motion.     Cervical back: Neck supple.     Right lower leg: No edema.     Left lower leg: No edema.  Lymphadenopathy:     Cervical: No cervical adenopathy.  Skin:    General: Skin is warm and dry.     Coloration: Skin is not pale.  Neurological:     General: No focal deficit present.     Mental Status: She is alert.     Lab Results  Component Value Date   WBC 8.9 01/05/2020   HGB 9.5 (L) 01/05/2020   HCT 28.0 (L) 01/05/2020   PLT 249.0 01/05/2020   GLUCOSE 106 (H) 01/05/2020   CHOL 120 06/08/2019   TRIG 104.0 06/08/2019   HDL 53.90 06/08/2019   LDLDIRECT 56.0 07/08/2018   LDLCALC 45 06/08/2019   ALT 47 (H) 01/05/2020   AST 21 01/05/2020   NA 139 01/05/2020   K 5.0 01/05/2020   CL 108 01/05/2020   CREATININE 1.47 (H) 01/05/2020   BUN 30 (H) 01/05/2020   CO2 25 01/05/2020   TSH 0.797 08/31/2019   INR 1.1 (H) 01/05/2020   HGBA1C 4.8 06/08/2019    MR ABDOMEN WO CONTRAST  Result Date: 12/31/2019 CLINICAL DATA:  Hepatitis. Renal insufficiency precludes administration of intravenous gadolinium contrast. EXAM: MRI ABDOMEN WITHOUT CONTRAST TECHNIQUE: Multiplanar multisequence MR imaging was performed without the administration of intravenous contrast. COMPARISON:  04/03/2018 FINDINGS: Study limited by motion artifact. Lower chest: Unremarkable. Hepatobiliary: No focal abnormality identified in the liver parenchyma although motion degradation could obscure small lesions. Signal intensity in the liver parenchyma is decreased on T2 imaging. There is also a diffuse drop of signal intensity in the liver parenchyma on inphase T1 imaging. These features suggest iron deposition within the liver parenchyma as seen in iron overload syndrome. Gallbladder surgically absent. No intra or  extrahepatic biliary duct dilatation. No choledocholithiasis. Pancreas: No focal mass lesion. No dilatation of the main duct. No intraparenchymal cyst. No peripancreatic edema. Spleen: Intensity spleen also demonstrates markedly on low signal T2 and in phase T1 imaging suggesting iron deposition. Adrenals/Urinary Tract: 13 mm left adrenal nodule is stable since CT of 03/17/2018 suggesting benign etiology. Due to motion this cannot be definitively characterized on the in and out of phase T1 imaging. Kidneys unremarkable. Stomach/Bowel: Small hiatal hernia stomach otherwise unremarkable. Duodenum is normally positioned as is the ligament of Treitz. No small bowel or colonic dilatation within the visualized abdomen Vascular/Lymphatic: No abdominal aortic aneurysm. No abdominal lymphadenopathy Other:  No intraperitoneal free fluid. Musculoskeletal: No suspicious marrow signal abnormality with degenerative disc disease  noted in the lumbar spine. IMPRESSION: 1. Study limited by motion and inability to give intravenous gadolinium secondary to renal insufficiency. 2. Diffuse loss of signal intensity in the liver and spleen on T2 imaging and inphase T1 imaging suggests iron deposition consistent with an iron overload syndrome. Involvement of both the liver and spleen suggests a secondary form. 3. 13 mm left adrenal nodule cannot be definitively characterized but is stable since 03/17/2018 suggesting benign etiology. 4. Small hiatal hernia. Electronically Signed   By: Misty Stanley M.D.   On: 12/31/2019 19:58    Assessment & Plan:   Yuliza was seen today for anemia.  Diagnoses and all orders for this visit:  Iron deficiency anemia due to chronic blood loss- Her H&H are stable.  Her iron level is normal.  Her ferritin level is elevated.  Her anemia is multifactorial including the anemia of chronic disease, the elderly, and renal insufficiency.  Due to the recent elevation in her LFTs and the abnormal MRI I have asked  her to withhold the iron supplement for the next few months. -     IBC panel; Future  -     Ferritin; Future -     CBC with Differential/Platelet; Future  Elevated LFTs- Her LFTs are better.  Her MELD-Na score is 14 - this is reassuring that she will not have any complications from the liver over the next year.  She will discontinue taking an iron supplement. -     Protime-INR; Future -     Hepatic function panel; Future  Stage 3b chronic kidney disease- Her renal function is stable.  She will avoid nephrotoxic agents. -     Basic metabolic panel   I am having Claudia Lara maintain her vitamin C, cholecalciferol, Calcium Carb-Cholecalciferol (CALCIUM CARBONATE-VITAMIN D3 PO), multivitamin with minerals, carvedilol, traMADol, torsemide, pantoprazole, and ferrous sulfate.  No orders of the defined types were placed in this encounter.    Follow-up: No follow-ups on file.  [name], MD

## 2020-01-06 ENCOUNTER — Encounter: Payer: Self-pay | Admitting: Internal Medicine

## 2020-01-10 ENCOUNTER — Ambulatory Visit (INDEPENDENT_AMBULATORY_CARE_PROVIDER_SITE_OTHER)
Admission: RE | Admit: 2020-01-10 | Discharge: 2020-01-10 | Disposition: A | Discharge status: Home / Self Care | Payer: Medicare Other | Source: Ambulatory Visit | Attending: Pulmonary Disease | Admitting: Pulmonary Disease

## 2020-01-10 ENCOUNTER — Other Ambulatory Visit: Payer: Self-pay

## 2020-01-10 DIAGNOSIS — R911 Solitary pulmonary nodule: Secondary | ICD-10-CM | POA: Diagnosis not present

## 2020-01-11 DIAGNOSIS — Z961 Presence of intraocular lens: Secondary | ICD-10-CM | POA: Diagnosis not present

## 2020-01-17 ENCOUNTER — Telehealth: Payer: Self-pay | Admitting: Internal Medicine

## 2020-01-17 DIAGNOSIS — I10 Essential (primary) hypertension: Secondary | ICD-10-CM

## 2020-01-17 NOTE — Chronic Care Management (AMB) (Signed)
  Chronic Care Management   Note  01/17/2020 Name: Claudia Lara MRN: [id number] DOB: [birth year]  Claudia Lara is a 86 y.o. year old female who is a primary care patient of Janith Lima, MD. I reached out to Jennette Banker by phone today in response to a referral sent by [name]'s PCP, Janith Lima, MD.   [name] was given information about Chronic Care Management services today including:  1. CCM service includes personalized support from designated clinical staff supervised by her physician, including individualized plan of care and coordination with other care providers 2. 24/7 contact phone numbers for assistance for urgent and routine care needs. 3. Service will only be billed when office clinical staff spend 20 minutes or more in a month to coordinate care. 4. Only one practitioner may furnish and bill the service in a calendar month. 5. The patient may stop CCM services at any time (effective at the end of the month) by phone call to the office staff. 6. The patient will be responsible for cost sharing (co-pay) of up to 20% of the service fee (after annual deductible is met).  Patient agreed to services and verbal consent obtained.   Follow up plan:   [name] [employer] Scheduler

## 2020-01-18 ENCOUNTER — Other Ambulatory Visit: Payer: Self-pay | Admitting: Internal Medicine

## 2020-01-18 DIAGNOSIS — I5189 Other ill-defined heart diseases: Secondary | ICD-10-CM

## 2020-01-18 DIAGNOSIS — I10 Essential (primary) hypertension: Secondary | ICD-10-CM

## 2020-01-18 DIAGNOSIS — I519 Heart disease, unspecified: Secondary | ICD-10-CM

## 2020-01-18 MED ORDER — CARVEDILOL 12.5 MG PO TABS: 12.5000 mg | ORAL_TABLET | Freq: Two times a day (BID) | ORAL | 1 refills | Status: DC

## 2020-01-27 ENCOUNTER — Other Ambulatory Visit: Payer: Self-pay

## 2020-01-27 ENCOUNTER — Ambulatory Visit: Payer: Medicare Other | Admitting: Pulmonary Disease

## 2020-01-27 ENCOUNTER — Encounter: Payer: Self-pay | Admitting: Pulmonary Disease

## 2020-01-27 VITALS — BP 128/70 | HR 68 | Temp 98.0°F | Ht 65.0 in | Wt 166.0 lb

## 2020-01-27 DIAGNOSIS — R0602 Shortness of breath: Secondary | ICD-10-CM | POA: Diagnosis not present

## 2020-01-27 NOTE — Progress Notes (Signed)
Claudia Lara    [id number]    Jun 18, 1932  Primary Care Physician:Jones, Arvid Right, MD  Referring Physician: Janith Lima, MD [address] [place],  [place] [address]   Chief complaint: Follow-up for dyspnea, lung nodule  HPI: 86 year old with history of aortic stenosis, exertional dyspnea, arthritis. Complains of progressive dyspnea for the past 10 years.  Symptoms are mostly on exertion.  She does not have symptoms at rest, no cough, wheezing, mucus production. She follows with Dr. Harrington Challenger for mild aortic stenosis. Echo shows stable aortic stenosis with diastolic dysfunction, elevated filling pressures.  She had PFTs which showed isolated reduction in diffusion capacity and is referred to pulmonary for further evaluation  She has chronic arthritis of the DIP, IP and MP joints.  Denies morning stiffness.  No raynauds, rash, dysphagia, choking on food. She has had recurrent issues with gallstones, biliary obstruction status post cholecystectomy in 2017 and recently underwent ERCP on 3/9 and 04/07/18.  She reports that her symptoms of dyspnea improve whenever the gallstones are removed.  She has been on nitrofurantoin recently for cystitis but not on a prolonged basis.  Pets: No pets, birds, farm animals Occupation: Retired Network engineer Exposures: No relevant exposures.  No leak, hot tub, Jacuzzi.  No down pillows or comforters Smoking history: Never smoker.  Exposed to secondhand smoke as a child no significant travel Travel history: No significant travel Relevant family history: No significant family history of lung issues.  Interim history: Continues to have chronic dyspnea on exertion without change. Here for review of CT scan.  Outpatient Encounter Medications as of 01/27/2020  Medication Sig  . Calcium Carb-Cholecalciferol (CALCIUM CARBONATE-VITAMIN D3 PO) Take 1 tablet by mouth daily.   . carvedilol (COREG) 12.5 MG tablet Take 1 tablet (12.5 mg total) by mouth 2  (two) times daily with a meal.  . cholecalciferol (VITAMIN D) 1000 units tablet Take 1,000 Units by mouth daily.  . Multiple Vitamin (MULTIVITAMIN WITH MINERALS) TABS tablet Take 1 tablet by mouth daily.  . pantoprazole (PROTONIX) 40 MG tablet TAKE 1 TABLET BY MOUTH EVERY DAY  . torsemide (DEMADEX) 20 MG tablet TAKE 1 TABLET BY MOUTH EVERY DAY  . traMADol (ULTRAM) 50 MG tablet Take 1-2 tablets (50-100 mg total) by mouth every 12 (twelve) hours as needed for moderate pain.  . vitamin C (ASCORBIC ACID) 500 MG tablet Take 500 mg by mouth daily.  . [DISCONTINUED] ferrous sulfate 325 (65 FE) MG tablet Take 1 tablet (325 mg total) by mouth 2 (two) times daily with a meal.   No facility-administered encounter medications on file as of 01/27/2020.   Physical Exam: Blood pressure 128/70, pulse 68, temperature 98 F (36.7 C), temperature source Temporal, height 5\' 5"  (1.651 m), weight 166 lb (75.3 kg), SpO2 98 %. Gen:      No acute distress HEENT:  EOMI, sclera anicteric Neck:     No masses; no thyromegaly Lungs:    Clear to auscultation bilaterally; normal respiratory effort CV:         Regular rate and rhythm; no murmurs Abd:      + bowel sounds; soft, non-tender; no palpable masses, no distension Ext:    No edema; adequate peripheral perfusion Skin:      Warm and dry; no rash Neuro: alert and oriented x 3 Psych: normal mood and affect  Data Reviewed: Imaging: CT chest 03/17/2018-mild atelectasis at the bases. Small left pleural effusion. High-resolution CT 05/08/2018-  7 mm nodule in the right upper lobe.  Minimal basilar groundglass attenuation, septal thickening.  High-resolution CT 06/18/2019- stable interstitial changes at the bases.  Enlarging right upper lobe pulmonary nodule now measuring 9 mm  PET scan 07/02/2019- no uptake in the right upper lobe nodule SUV 0.9 or anywhere else  CT chest 01/10/2020-minimal increase in right upper lobe nodule.  No evidence of interstitial lung disease I  have reviewed the images personally.  PFTs 04/03/2018 FVC 2.29 [95%], FEV1 1.79 [101%], F/F 78, TLC 86%, DLCO 51%, DLCO/VA 60% Isolated reduction in diffusion capacity.  Echocardiogram 02/18/2018- LVEF 65-70%, elevated LV filling pressure PA peak pressure 45, grade 1 diastolic dysfunction  Labs ANA, rheumatoid factor, CCP 04/27/2018-negative  Biodesix Nodify XL2 Test 07/15/2019 Pre test-17% risk of malignancy Posttest 5%, reduced risk of malignancy  Assessment:  Evaluation for dyspnea, abnormal PFTs PFTs reviewed which show isolated reduction in diffusion capacity.  There is no restriction. I have reviewed the high-resolution CT which shows very minimal changes at the bases.  There is no convincing evidence of interstitial lung disease.  Serologies for ANA, rheumatoid factor, CCP are negative  Since she continues to be symptomatic we will schedule a cardiopulmonary exercise test for evaluation of dyspnea  Pulmonary nodule CT scan reviewed with slightly enlarged pulmonary nodule  This is likely benign with negative biodesix blood test, negative PET scan though this may be limited by the small size of the nodule Discussed in detail with patient about going for biopsy or watchful waiting.  She would prefer to get a repeat CT and not go for the biopsy  Follow-up CT scan in 1 year.  More then 1/2 the time of the 40 min visit was spent in counseling and/or coordination of care with the patient and family.  Plan/Recommendations: - CPX - CT without contrast in 12 months  Marshell Garfinkel MD [facility] 01/27/2020, 11:49 AM  CC:  Janith Lima, MD   Fay Records MD

## 2020-01-27 NOTE — Patient Instructions (Addendum)
I have reviewed your CT which shows a minimal change in the size of the nodule There is no evidence of interstitial lung disease Since you continue to be symptomatic with dyspnea we will schedule you for a cardiopulmonary exercise test.  Follow up in 3 months

## 2020-01-28 ENCOUNTER — Telehealth: Payer: Medicare Other

## 2020-01-29 NOTE — Addendum Note (Signed)
Addended by: Aviva Signs M on: 01/29/2020 10:23 AM   Modules accepted: Orders

## 2020-02-01 ENCOUNTER — Encounter: Payer: Self-pay | Admitting: Internal Medicine

## 2020-02-01 ENCOUNTER — Telehealth: Payer: Self-pay

## 2020-02-01 ENCOUNTER — Other Ambulatory Visit: Payer: Self-pay | Admitting: Internal Medicine

## 2020-02-01 NOTE — Telephone Encounter (Signed)
New message   The patient calling C/o vertigo since Sunday morning.   Wanting to know if there something Dr. Ronnald Ramp can prescribe for her to take, the patient aware she is unable to drive to the office.   Please advise.

## 2020-02-01 NOTE — Telephone Encounter (Signed)
Pt states that she has been dizzy since Sunday. She is request something for vertigo because she is not able to drive in for an appointment. Pt stated that she does not have anyone to bring her in.

## 2020-02-04 ENCOUNTER — Other Ambulatory Visit (HOSPITAL_COMMUNITY)
Admission: RE | Admit: 2020-02-04 | Discharge: 2020-02-04 | Disposition: A | Discharge status: Home / Self Care | Payer: Medicare Other | Source: Ambulatory Visit | Attending: Pulmonary Disease | Admitting: Pulmonary Disease

## 2020-02-04 DIAGNOSIS — Z20822 Contact with and (suspected) exposure to covid-19: Secondary | ICD-10-CM | POA: Insufficient documentation

## 2020-02-04 DIAGNOSIS — Z01812 Encounter for preprocedural laboratory examination: Secondary | ICD-10-CM | POA: Insufficient documentation

## 2020-02-04 LAB — SARS CORONAVIRUS 2 (TAT 6-24 HRS): SARS Coronavirus 2: NEGATIVE

## 2020-02-08 ENCOUNTER — Encounter (HOSPITAL_COMMUNITY): Payer: Medicare Other

## 2020-02-10 ENCOUNTER — Ambulatory Visit (INDEPENDENT_AMBULATORY_CARE_PROVIDER_SITE_OTHER): Payer: Medicare Other | Admitting: Internal Medicine

## 2020-02-10 ENCOUNTER — Encounter: Payer: Self-pay | Admitting: Internal Medicine

## 2020-02-10 ENCOUNTER — Other Ambulatory Visit: Payer: Self-pay

## 2020-02-10 VITALS — BP 156/58 | HR 74 | Temp 98.0°F | Resp 16 | Ht 65.0 in | Wt 166.8 lb

## 2020-02-10 DIAGNOSIS — I10 Essential (primary) hypertension: Secondary | ICD-10-CM | POA: Diagnosis not present

## 2020-02-10 DIAGNOSIS — D500 Iron deficiency anemia secondary to blood loss (chronic): Secondary | ICD-10-CM

## 2020-02-10 DIAGNOSIS — H8113 Benign paroxysmal vertigo, bilateral: Secondary | ICD-10-CM | POA: Diagnosis not present

## 2020-02-10 LAB — BASIC METABOLIC PANEL
BUN: 25 mg/dL — ABNORMAL HIGH (ref 6–23)
CO2: 27 mEq/L (ref 19–32)
Calcium: 9.2 mg/dL (ref 8.4–10.5)
Chloride: 110 mEq/L (ref 96–112)
Creatinine, Ser: 1.15 mg/dL (ref 0.40–1.20)
GFR: 44.75 mL/min — ABNORMAL LOW (ref >60.00)
Glucose, Bld: 104 mg/dL — ABNORMAL HIGH (ref 70–99)
Potassium: 4.6 mEq/L (ref 3.5–5.1)
Sodium: 141 mEq/L (ref 135–145)

## 2020-02-10 LAB — CBC WITH DIFFERENTIAL/PLATELET
Basophils Absolute: 0.1 10*3/uL (ref 0.0–0.1)
Basophils Relative: 0.8 % (ref 0.0–3.0)
Eosinophils Absolute: 0.3 10*3/uL (ref 0.0–0.7)
Eosinophils Relative: 4 % (ref 0.0–5.0)
HCT: 27.7 % — ABNORMAL LOW (ref 36.0–46.0)
Hemoglobin: 9.6 g/dL — ABNORMAL LOW (ref 12.0–15.0)
Lymphocytes Relative: 15.2 % (ref 12.0–46.0)
Lymphs Abs: 1.2 10*3/uL (ref 0.7–4.0)
MCHC: 34.6 g/dL (ref 30.0–36.0)
MCV: 91.3 fl (ref 78.0–100.0)
Monocytes Absolute: 0.7 10*3/uL (ref 0.1–1.0)
Monocytes Relative: 8.8 % (ref 3.0–12.0)
Neutro Abs: 5.7 10*3/uL (ref 1.4–7.7)
Neutrophils Relative %: 71.2 % (ref 43.0–77.0)
Platelets: 224 10*3/uL (ref 150.0–400.0)
RBC: 3.03 Mil/uL — ABNORMAL LOW (ref 3.87–5.11)
RDW: 19.8 % — ABNORMAL HIGH (ref 11.5–15.5)
WBC: 8 10*3/uL (ref 4.0–10.5)

## 2020-02-10 NOTE — Progress Notes (Signed)
Subjective:  Patient ID: Claudia Lara, female    DOB: Jan 27, 1932  Age: 86 y.o. MRN: [id number]  CC: Hypertension and Anemia  This visit occurred during the SARS-CoV-2 public health emergency.  Safety protocols were in place, including screening questions prior to the visit, additional usage of staff PPE, and extensive cleaning of exam room while observing appropriate contact time as indicated for disinfecting solutions.    HPI [name] presents for f/up - She developed another episode of vertigo about 10 days ago.  She had vertigo and dizziness.  Her legs felt wobbly.  She tells me that all of the symptoms have improved.  She also had a sensation that worms were crawling on her legs.  She denies headache, blurred vision, ataxia, nausea, or vomiting.  Outpatient Medications Prior to Visit  Medication Sig Dispense Refill  . Calcium Carb-Cholecalciferol (CALCIUM CARBONATE-VITAMIN D3 PO) Take 1 tablet by mouth daily.     . carvedilol (COREG) 12.5 MG tablet Take 1 tablet (12.5 mg total) by mouth 2 (two) times daily with a meal. 180 tablet 1  . cholecalciferol (VITAMIN D) 1000 units tablet Take 1,000 Units by mouth daily.    . Multiple Vitamin (MULTIVITAMIN WITH MINERALS) TABS tablet Take 1 tablet by mouth daily.    . pantoprazole (PROTONIX) 40 MG tablet TAKE 1 TABLET BY MOUTH EVERY DAY 90 tablet 1  . torsemide (DEMADEX) 20 MG tablet TAKE 1 TABLET BY MOUTH EVERY DAY 90 tablet 1  . traMADol (ULTRAM) 50 MG tablet Take 1-2 tablets (50-100 mg total) by mouth every 12 (twelve) hours as needed for moderate pain. 56 tablet 0  . vitamin C (ASCORBIC ACID) 500 MG tablet Take 500 mg by mouth daily.     No facility-administered medications prior to visit.    ROS Review of Systems  Constitutional: Positive for fatigue. Negative for appetite change, diaphoresis and unexpected weight change.  HENT: Negative.   Eyes: Negative for visual disturbance.  Respiratory: Negative for cough, chest  tightness, shortness of breath and wheezing.   Cardiovascular: Negative for chest pain, palpitations and leg swelling.  Endocrine: Negative.   Genitourinary: Negative.  Negative for difficulty urinating.  Musculoskeletal: Positive for gait problem. Negative for arthralgias, myalgias and neck pain.  Skin: Negative.  Negative for color change, pallor and rash.  Neurological: Positive for dizziness and weakness. Negative for light-headedness, numbness and headaches.  Hematological: Negative for adenopathy. Does not bruise/bleed easily.  Psychiatric/Behavioral: Negative.     Objective:  BP (!) 156/58 (BP Location: Left Arm, Patient Position: Sitting, Cuff Size: Normal)   Pulse 74   Temp 98 F (36.7 C) (Oral)   Resp 16   Ht 5\' 5"  (1.651 m)   Wt 166 lb 12 oz (75.6 kg)   SpO2 96%   BMI 27.75 kg/m   BP Readings from Last 3 Encounters:  02/10/20 (!) 156/58  01/27/20 128/70  01/05/20 (!) 142/56    Wt Readings from Last 3 Encounters:  02/10/20 166 lb 12 oz (75.6 kg)  01/27/20 166 lb (75.3 kg)  01/05/20 164 lb (74.4 kg)    Physical Exam Vitals reviewed.  Constitutional:      Appearance: Normal appearance.  HENT:     Nose: Nose normal.     Mouth/Throat:     Mouth: Mucous membranes are moist.  Eyes:     General: No scleral icterus.    Conjunctiva/sclera: Conjunctivae normal.  Cardiovascular:     Rate and Rhythm: Normal rate and regular  rhythm.     Heart sounds: Murmur present. Systolic murmur present with a grade of 2/6. No diastolic murmur. No gallop.   Pulmonary:     Breath sounds: No stridor. No wheezing, rhonchi or rales.  Abdominal:     General: Abdomen is flat.     Palpations: There is no mass.     Tenderness: There is no abdominal tenderness. There is no guarding.  Musculoskeletal:        General: Normal range of motion.     Cervical back: Neck supple.     Right lower leg: No edema.     Left lower leg: No edema.  Lymphadenopathy:     Cervical: No cervical  adenopathy.  Skin:    General: Skin is warm and dry.     Coloration: Skin is not pale.  Neurological:     General: No focal deficit present.     Mental Status: She is alert.     Cranial Nerves: Cranial nerves are intact.     Sensory: Sensation is intact.     Motor: Weakness present.     Coordination: Coordination is intact. Coordination normal. Rapid alternating movements normal.     Gait: Gait is intact. Gait and tandem walk normal.     Comments: She has mild, bilateral, diffuse weakness in both lower extremities but her gait is normal.  Psychiatric:        Mood and Affect: Mood normal.        Behavior: Behavior normal.     Lab Results  Component Value Date   WBC 8.0 02/10/2020   HGB 9.6 (L) 02/10/2020   HCT 27.7 (L) 02/10/2020   PLT 224.0 02/10/2020   GLUCOSE 104 (H) 02/10/2020   CHOL 120 06/08/2019   TRIG 104.0 06/08/2019   HDL 53.90 06/08/2019   LDLDIRECT 56.0 07/08/2018   LDLCALC 45 06/08/2019   ALT 47 (H) 01/05/2020   AST 21 01/05/2020   NA 141 02/10/2020   K 4.6 02/10/2020   CL 110 02/10/2020   CREATININE 1.15 02/10/2020   BUN 25 (H) 02/10/2020   CO2 27 02/10/2020   TSH 0.797 08/31/2019   INR 1.1 (H) 01/05/2020   HGBA1C 4.8 06/08/2019    No results found.  Assessment & Plan:   Lahni was seen today for hypertension and anemia.  Diagnoses and all orders for this visit:  Essential hypertension- Her BP is adequately well controlled. Lytes and renal function are ok. -     Basic metabolic panel; Future -     Basic metabolic panel  Iron deficiency anemia due to chronic blood loss- Her H/H have improved slighty -     CBC with Differential/Platelet; Future -     CBC with Differential/Platelet  Vertigo, benign paroxysmal, bilateral- This is benign and resolving   I am having Jennette Banker maintain her vitamin C, cholecalciferol, Calcium Carb-Cholecalciferol (CALCIUM CARBONATE-VITAMIN D3 PO), multivitamin with minerals, traMADol, torsemide, pantoprazole,  and carvedilol.  No orders of the defined types were placed in this encounter.    Follow-up: No follow-ups on file.  [name], MD

## 2020-02-14 ENCOUNTER — Encounter: Payer: Self-pay | Admitting: Internal Medicine

## 2020-02-14 DIAGNOSIS — H8113 Benign paroxysmal vertigo, bilateral: Secondary | ICD-10-CM | POA: Insufficient documentation

## 2020-02-14 NOTE — Patient Instructions (Signed)

## 2020-02-15 DIAGNOSIS — L84 Corns and callosities: Secondary | ICD-10-CM | POA: Diagnosis not present

## 2020-02-15 DIAGNOSIS — L570 Actinic keratosis: Secondary | ICD-10-CM | POA: Diagnosis not present

## 2020-02-15 DIAGNOSIS — C44622 Squamous cell carcinoma of skin of right upper limb, including shoulder: Secondary | ICD-10-CM | POA: Diagnosis not present

## 2020-02-15 DIAGNOSIS — X32XXXD Exposure to sunlight, subsequent encounter: Secondary | ICD-10-CM | POA: Diagnosis not present

## 2020-02-15 DIAGNOSIS — L820 Inflamed seborrheic keratosis: Secondary | ICD-10-CM | POA: Diagnosis not present

## 2020-02-26 ENCOUNTER — Ambulatory Visit: Payer: Medicare Other | Attending: Internal Medicine

## 2020-02-26 DIAGNOSIS — Z23 Encounter for immunization: Secondary | ICD-10-CM

## 2020-02-26 NOTE — Progress Notes (Signed)
   Covid-19 Vaccination Clinic  Name:  LEKITA DIBBLE    MRN: [id number] DOB: [birth year]  02/26/2020  Ms. Pinos was observed post Covid-19 immunization for 15 minutes without incident. She was provided with Vaccine Information Sheet and instruction to access the V-Safe system.   Ms. Fekete was instructed to call 911 with any severe reactions post vaccine: [name] Difficulty breathing  . Swelling of face and throat  . A fast heartbeat  . A bad rash all over body  . Dizziness and weakness   Immunizations Administered    Name Date Dose VIS Date Route   Pfizer COVID-19 Vaccine 02/26/2020 10:43 AM 0.3 mL 11/05/2019 Intramuscular   Manufacturer: [employer], [employer]   Lot: [id number]   [place]: [id number]

## 2020-03-21 DIAGNOSIS — X32XXXD Exposure to sunlight, subsequent encounter: Secondary | ICD-10-CM | POA: Diagnosis not present

## 2020-03-21 DIAGNOSIS — L570 Actinic keratosis: Secondary | ICD-10-CM | POA: Diagnosis not present

## 2020-03-21 DIAGNOSIS — T691XXA Chilblains, initial encounter: Secondary | ICD-10-CM | POA: Diagnosis not present

## 2020-03-21 DIAGNOSIS — Z85828 Personal history of other malignant neoplasm of skin: Secondary | ICD-10-CM | POA: Diagnosis not present

## 2020-03-21 DIAGNOSIS — Z08 Encounter for follow-up examination after completed treatment for malignant neoplasm: Secondary | ICD-10-CM | POA: Diagnosis not present

## 2020-03-22 ENCOUNTER — Ambulatory Visit: Payer: Medicare Other | Attending: Internal Medicine

## 2020-03-22 DIAGNOSIS — Z23 Encounter for immunization: Secondary | ICD-10-CM

## 2020-03-22 NOTE — Progress Notes (Signed)
   Covid-19 Vaccination Clinic  Name:  Claudia Lara    MRN: [id number] DOB: 1932-08-05  03/22/2020  Claudia Lara was observed post Covid-19 immunization for 15 minutes without incident. She was provided with Vaccine Information Sheet and instruction to access the V-Safe system.   Claudia Lara was instructed to call 911 with any severe reactions post vaccine: [name] Difficulty breathing  . Swelling of face and throat  . A fast heartbeat  . A bad rash all over body  . Dizziness and weakness   Immunizations Administered    Name Date Dose VIS Date Route   Pfizer COVID-19 Vaccine 03/22/2020 10:24 AM 0.3 mL 01/19/2019 Intramuscular   Manufacturer: [place]   Lot: [id number]   [place]: [id number]

## 2020-04-05 ENCOUNTER — Ambulatory Visit: Payer: Medicare Other | Admitting: Orthopaedic Surgery

## 2020-04-05 ENCOUNTER — Encounter: Payer: Self-pay | Admitting: Orthopaedic Surgery

## 2020-04-05 ENCOUNTER — Other Ambulatory Visit: Payer: Self-pay

## 2020-04-05 DIAGNOSIS — G5602 Carpal tunnel syndrome, left upper limb: Secondary | ICD-10-CM | POA: Diagnosis not present

## 2020-04-05 NOTE — Progress Notes (Signed)
Office Visit Note   Patient: ZUHUR BORGES           Date of Birth: Jan 20, 1932           MRN: [id number] Visit Date: 04/05/2020              Requested by: Janith Lima, MD [address] [place],  [place] [address] PCP: Janith Lima, MD   Assessment & Plan: Visit Diagnoses:  1. Carpal tunnel syndrome, left upper limb     Plan: Carpal tunnel symptoms left upper extremity with positive Phalen's and Tinel's at the wrist.  Will obtain EMGs and nerve conduction studies.  Had successful right carpal tunnel release several years ago and doing well.  Has significant arthritis in her hand and fingers that prevents her from making a full fist  Follow-Up Instructions: Return After EMGs and nerve conduction studies left upper extremity.   Orders:  No orders of the defined types were placed in this encounter.  No orders of the defined types were placed in this encounter.     Procedures: No procedures performed   Clinical Data: No additional findings.   Subjective: Chief Complaint  Patient presents with  . Left Hand - Pain  Patient presents today for left hand pain. She said that it has been hurting for almost a month. No known injury. She states her fingers 2-5 stay numb. She has minimal pain. She has not noticed any weakness. She is right hand dominant. She had had carpal tunnel release on the right side in the past and states that her left hand feels like the right side did before surgery.  Had left total knee replacement by Dr.Alusio in August and still having some trouble but does not use any ambulatory aid. Had EMGs and nerve conduction studies to the right upper extremity several years ago prior to her carpal tunnel release.  They were not performed on the left  HPI  Review of Systems  Constitutional: Positive for fatigue.  HENT: Negative for ear pain.   Eyes: Negative for pain.  Respiratory: Negative for shortness of breath.   Cardiovascular: Negative for leg  swelling.  Gastrointestinal: Negative for constipation and diarrhea.  Endocrine: Negative for cold intolerance and heat intolerance.  Genitourinary: Negative for difficulty urinating.  Musculoskeletal: Positive for joint swelling.  Skin: Negative for rash.  Allergic/Immunologic: Negative for food allergies.  Neurological: Negative for weakness.  Hematological: Does not bruise/bleed easily.  Psychiatric/Behavioral: Negative for sleep disturbance.     Objective: Vital Signs: Ht 5\' 5"  (1.651 m)   Wt 165 lb (74.8 kg)   BMI 27.46 kg/m   Physical Exam Constitutional:      Appearance: She is well-developed.  Eyes:     Pupils: Pupils are equal, round, and reactive to light.  Pulmonary:     Effort: Pulmonary effort is normal.  Skin:    General: Skin is warm and dry.  Neurological:     Mental Status: She is alert and oriented to person, place, and time.  Psychiatric:        Behavior: Behavior normal.     Ortho Exam left hand with positive Tinel's over the median nerve for discomfort into the radial 3 digits.  Has significant degenerative changes in the PIP and DIP joints diffusely and inability to make a full fist.  Has atrophy of the thenar eminence but is able to oppose thumb to little finger no swelling of the fingers and good capillary refill  Specialty Comments:  No specialty comments available.  Imaging: No results found.   PMFS History: Patient Active Problem List   Diagnosis Date Noted  . Carpal tunnel syndrome, left upper limb 04/05/2020  . Vertigo, benign paroxysmal, bilateral 02/14/2020  . Elevated LFTs 12/30/2019  . Iron deficiency anemia due to chronic blood loss 09/08/2019  . Interstitial pulmonary disease ([place]) 09/08/2019  . Lower leg DVT (deep venous thromboembolism), acute, left ([place]) 08/18/2019  . OA (osteoarthritis) of knee 07/26/2019  . Lung nodule 06/24/2019  . Bilateral leg edema 06/08/2019  . DDD (degenerative disc disease), cervical 04/16/2019  .  Zinc deficiency 10/03/2017  . Spondylosis, lumbar, with myelopathy 04/09/2017  . GERD (gastroesophageal reflux disease) 08/03/2015  . Hemolytic anemia ([place]) 07/12/2015  . Chronic venous insufficiency 08/10/2014  . Aortic stenosis, mild 06/29/2014  . Left ventricular diastolic dysfunction, NYHA class 1 06/29/2014  . Senile osteopenia 04/21/2014  . Hyperlipidemia with target LDL less than 130 04/21/2014  . Kidney disease, chronic, stage III (GFR 30-59 ml/min) ([place]) 04/21/2014  . Routine health maintenance 07/16/2012  . Carotid artery stenosis without cerebral infarction 01/16/2010  . Essential hypertension 10/19/2007   Past Medical History:  Diagnosis Date  . ANEMIA 08/22/2009  . Aortic stenosis   . AR (aortic regurgitation) 01/2019   Mild to Moderate, Noted on ECHO  . CAROTID ARTERY STENOSIS 01/16/2010  . Complication of anesthesia    very hard to wake up from surgery  . DYSPNEA ON EXERTION 11/16/2007  . Gallstones   . GEN OSTEOARTHROSIS INVOLVING MULTIPLE SITES 11/11/2008  . GERD (gastroesophageal reflux disease)   . Grade I diastolic dysfunction [id number]   Noted on ECHO  . Heart murmur   . HEMORRHOIDS, INTERNAL    surgery was in the 90's (per patient)  . History of blood transfusion    after hip and knee replacements  . NECK PAIN, ACUTE 12/28/2009  . PERIPHERAL EDEMA 10/06/2007  . PONV (postoperative nausea and vomiting)   . Pulmonary nodule 06/18/2019   noted on CT Chest   . PVD 10/06/2007  . Unspecified essential hypertension 10/19/2007    Family History  Problem Relation Age of Onset  . Ovarian cancer Mother   . Cancer Mother   . Leukemia Father   . Lung cancer Father   . Cancer Father   . Cancer Brother   . Diabetes Neg Hx   . Heart disease Neg Hx   . Hypertension Neg Hx     Past Surgical History:  Procedure Laterality Date  . ABDOMINAL HYSTERECTOMY    . BIOPSY  08/06/2019   Procedure: BIOPSY;  Surgeon: Gatha Mayer, MD;  Location: [facility];  Service:  Gastroenterology;;  . [name]     bilat  . CARPAL TUNNEL RELEASE    . CHOLECYSTECTOMY  01/29/2016   at [facility]  . CHOLECYSTECTOMY    . ENDOSCOPIC RETROGRADE CHOLANGIOPANCREATOGRAPHY (ERCP) WITH PROPOFOL N/A 12/01/2015   Procedure: ENDOSCOPIC RETROGRADE CHOLANGIOPANCREATOGRAPHY (ERCP) WITH PROPOFOL;  Surgeon: [name], MD;  Location: [facility];  Service: Endoscopy;  Laterality: N/A;  . ENDOSCOPIC RETROGRADE CHOLANGIOPANCREATOGRAPHY (ERCP) WITH PROPOFOL N/A 02/01/2016   Procedure: ENDOSCOPIC RETROGRADE CHOLANGIOPANCREATOGRAPHY (ERCP) WITH PROPOFOL;  Surgeon: [name], MD;  Location: [facility];  Service: Endoscopy;  Laterality: N/A;  . ENDOSCOPIC RETROGRADE CHOLANGIOPANCREATOGRAPHY (ERCP) WITH PROPOFOL N/A 04/07/2018   Procedure: ENDOSCOPIC RETROGRADE CHOLANGIOPANCREATOGRAPHY (ERCP) WITH PROPOFOL;  Surgeon: [name], MD;  Location: [facility];  Service: Endoscopy;  Laterality: N/A;  . ESOPHAGOGASTRODUODENOSCOPY (  EGD) WITH PROPOFOL N/A 08/06/2019   Procedure: ESOPHAGOGASTRODUODENOSCOPY (EGD) WITH PROPOFOL;  Surgeon: Gatha Mayer, MD;  Location: [facility];  Service: Gastroenterology;  Laterality: N/A;  . HAMMER TOE SURGERY    . HEMORRHOID SURGERY    . Hyperplastic colon polyps, removed  2007   By Dr. Penelope Coop  . JOINT REPLACEMENT  2011   right knee  . OOPHORECTOMY    . REMOVAL OF STONES  04/07/2018   Procedure: REMOVAL OF STONES;  Surgeon: [name], MD;  Location: [facility];  Service: Endoscopy;;  . ROTATOR CUFF REPAIR  2004   left (Dr. Durward Fortes)  . SPHINCTEROTOMY  04/07/2018   Procedure: SPHINCTEROTOMY;  Surgeon: [name], MD;  Location: [facility];  Service: Endoscopy;;  . TOTAL HIP ARTHROPLASTY Right 11/26/2016   Procedure: RIGHT TOTAL HIP ARTHROPLASTY;  Surgeon: Garald Balding, MD;  Location: [facility];  Service: Orthopedics;  Laterality: Right;  . TOTAL KNEE ARTHROPLASTY Right   . TOTAL KNEE ARTHROPLASTY Left 07/26/2019    Procedure: TOTAL KNEE ARTHROPLASTY;  Surgeon: Gaynelle Arabian, MD;  Location: [facility];  Service: Orthopedics;  Laterality: Left;  52min   Social History   Occupational History  . Occupation: Producer, television/film/video: RETIRED    Comment: retired  Tobacco Use  . Smoking status: Never Smoker  . Smokeless tobacco: Never Used  Substance and Sexual Activity  . Alcohol use: No  . Drug use: No  . Sexual activity: Not Currently    Birth control/protection: Surgical

## 2020-04-05 NOTE — Addendum Note (Signed)
Addended by: Lendon Collar on: 04/05/2020 02:31 PM   Modules accepted: Orders

## 2020-04-18 DIAGNOSIS — L255 Unspecified contact dermatitis due to plants, except food: Secondary | ICD-10-CM | POA: Diagnosis not present

## 2020-04-18 DIAGNOSIS — L821 Other seborrheic keratosis: Secondary | ICD-10-CM | POA: Diagnosis not present

## 2020-05-01 NOTE — Progress Notes (Signed)
Claudia Lara - 86 y.o. female MRN [id number]  Date of birth: 1932/09/06  Office Visit Note: Visit Date: 11/09/2019 PCP: Janith Lima, MD Referred by: Janith Lima, MD  Subjective: Chief Complaint  Patient presents with  . Lower Back - Pain   HPI:  Claudia Lara is a 86 y.o. female who comes in today for planned repeat right L5-S1 lumbar epidural steroid injection with fluoroscopic guidance.  The patient has failed conservative care including home exercise, medications, time and activity modification.  This injection will be diagnostic and hopefully therapeutic.  Please see requesting physician notes for further details and justification.   I have seen the patient on numerous occasions over the years and she does very well with intermittent epidural injection.  The last time we saw her for injection was in March 2020.  She is followed by Dr. Joni Fears from an orthopedic standpoint.  She is getting pain in the back and both buttocks.  Last CT myelogram did not show any high-grade central stenosis she did have disc protrusion at L3-4 with facet arthritis at L5-S1 with some lateral recess narrowing.   ROS Otherwise per HPI.  Assessment & Plan: Visit Diagnoses:  1. Lumbar radiculopathy   2. Stenosis of lateral recess of lumbar spine     Plan: No additional findings.   Meds & Orders:  Meds ordered this encounter  Medications  . methylPREDNISolone acetate (DEPO-MEDROL) injection 80 mg    Orders Placed This Encounter  Procedures  . XR C-ARM NO REPORT  . Epidural Steroid injection    Follow-up: Return if symptoms worsen or fail to improve.   Procedures: No procedures performed  Lumbar Epidural Steroid Injection - Interlaminar Approach with Fluoroscopic Guidance  Patient: Claudia Lara      Date of Birth: [birth year] MRN: [id number] PCP: Janith Lima, MD      Visit Date: 11/09/2019   Universal Protocol:     Consent Given By: the patient  Position:  PRONE  Additional Comments: Vital signs were monitored before and after the procedure. Patient was prepped and draped in the usual sterile fashion. The correct patient, procedure, and site was verified.   Injection Procedure Details:  Procedure Site One Meds Administered:  Meds ordered this encounter  Medications  . methylPREDNISolone acetate (DEPO-MEDROL) injection 80 mg     Laterality: Right to the midline  Location/Site:  L5-S1  Needle size: 20 G  Needle type: Tuohy  Needle Placement: Paramedian epidural  Findings:   -Comments: Excellent flow of contrast into the epidural space.  Procedure Details: Using a paramedian approach from the side mentioned above, the region overlying the inferior lamina was localized under fluoroscopic visualization and the soft tissues overlying this structure were infiltrated with 4 ml. of 1% Lidocaine without Epinephrine. The Tuohy needle was inserted into the epidural space using a paramedian approach.   The epidural space was localized using loss of resistance along with lateral and bi-planar fluoroscopic views.  After negative aspirate for air, blood, and CSF, a 2 ml. volume of Isovue-250 was injected into the epidural space and the flow of contrast was observed. Radiographs were obtained for documentation purposes.    The injectate was administered into the level noted above.   Additional Comments:  The patient tolerated the procedure well Dressing: 2 x 2 sterile gauze and Band-Aid    Post-procedure details: Patient was observed during the procedure. Post-procedure instructions were reviewed.  Patient left the clinic in stable condition.  Clinical History: CT LUMBAR MYELOGRAM FINDINGS:  The lumbar spine is imaged from the midbody of T12 through S2-3.  Slight retrolisthesis is present at L2-3. Slight anterolisthesis is present at L4-5. There is no significant interval change.  Atherosclerotic calcifications are present  in the aorta without aneurysm. Cholecystectomy is noted. No other discrete solid organ lesions are present. There is no significant adenopathy. Multiple subcentimeter para-aortic nodes are present.  L1-2: A broad-based disc protrusion is present. Moderate facet hypertrophy is noted bilaterally. Mild subarticular narrowing is worse on the left. Mild foraminal narrowing is worse on the left. There is no significant change.  L2-3: A broad-based disc protrusion is present. Moderate facet hypertrophy and ligamentum flavum thickening is noted. Moderate subarticular narrowing is worse on the right. This has progressed some. Moderate right and mild left foraminal narrowing is present.  L3-4: A broad-based disc protrusion is present. Advanced facet hypertrophy has progressed. This results in moderate right and mild left subarticular narrowing with progression. Moderate right and mild left foraminal narrowing has progressed some as well.  L4-5: A broad-based disc protrusion is present. Advanced facet hypertrophy is noted. Severe right and moderate left foraminal stenosis is present. Progressive moderate foraminal narrowing is again seen bilaterally, right greater than left.  L5-S1: A prominent central disc protrusion is present. The Moderate facet hypertrophy and spurring has progressed. Moderate subarticular narrowing is worse on the left. Moderate foraminal narrowing is also worse on the left without definite change.  IMPRESSION: 1. Progressive multilevel spondylosis of the lumbar spine. 2. The L3-4 disc protrusion is worse with standing. 3. Other disc disease does not significantly change with standing. There is no abnormal motion with standing. 4.  Aortic Atherosclerosis (ICD10-I70.0). 5. Mild subarticular and foraminal narrowing at L1-2 is stable, worse on the left. 6. Moderate subarticular stenosis at L2-3 has progressed. There is also progressive moderate right and mild left  foraminal narrowing at the same level. 7. Moderate right and mild left subarticular and foraminal stenosis at L3-4 has progressed. 8. The severe right and moderate left foraminal stenosis and moderate bilateral foraminal narrowing at L4-5 has progressed. 9. Progressive facet disease L5 to S for leading to moderate subarticular and foraminal stenosis, left greater than right. Subarticular stenosis has progressed. Foraminal narrowing is stable.   Electronically Signed   By: San Morelle M.D.   On: 12/01/2017 13:23     Objective:  VS:  HT:    WT:   BMI:     BP:(!) 177/69  HR:79bpm  TEMP: ( )  RESP:  Physical Exam Constitutional:      General: She is not in acute distress.    Appearance: Normal appearance. She is not ill-appearing.  HENT:     Head: Normocephalic and atraumatic.     Right Ear: External ear normal.     Left Ear: External ear normal.  Eyes:     Extraocular Movements: Extraocular movements intact.  Cardiovascular:     Rate and Rhythm: Normal rate.     Pulses: Normal pulses.  Musculoskeletal:     Right lower leg: No edema.     Left lower leg: No edema.     Comments: Patient has good distal strength with no pain over the greater trochanters.  No clonus or focal weakness.  Back pain increases with facet loading and extension.  Skin:    Findings: No erythema, lesion or rash.  Neurological:     General: No focal deficit present.     Mental Status: She  is alert and oriented to person, place, and time.     Sensory: No sensory deficit.     Motor: No weakness or abnormal muscle tone.     Coordination: Coordination normal.  Psychiatric:        Mood and Affect: Mood normal.        Behavior: Behavior normal.      Imaging: No results found.

## 2020-05-01 NOTE — Procedures (Signed)
Lumbar Epidural Steroid Injection - Interlaminar Approach with Fluoroscopic Guidance  Patient: Claudia Lara      Date of Birth: 1932-02-12 MRN: [id number] PCP: Janith Lima, MD      Visit Date: 11/09/2019   Universal Protocol:     Consent Given By: the patient  Position: PRONE  Additional Comments: Vital signs were monitored before and after the procedure. Patient was prepped and draped in the usual sterile fashion. The correct patient, procedure, and site was verified.   Injection Procedure Details:  Procedure Site One Meds Administered:  Meds ordered this encounter  Medications  . methylPREDNISolone acetate (DEPO-MEDROL) injection 80 mg     Laterality: Right to the midline  Location/Site:  L5-S1  Needle size: 20 G  Needle type: Tuohy  Needle Placement: Paramedian epidural  Findings:   -Comments: Excellent flow of contrast into the epidural space.  Procedure Details: Using a paramedian approach from the side mentioned above, the region overlying the inferior lamina was localized under fluoroscopic visualization and the soft tissues overlying this structure were infiltrated with 4 ml. of 1% Lidocaine without Epinephrine. The Tuohy needle was inserted into the epidural space using a paramedian approach.   The epidural space was localized using loss of resistance along with lateral and bi-planar fluoroscopic views.  After negative aspirate for air, blood, and CSF, a 2 ml. volume of Isovue-250 was injected into the epidural space and the flow of contrast was observed. Radiographs were obtained for documentation purposes.    The injectate was administered into the level noted above.   Additional Comments:  The patient tolerated the procedure well Dressing: 2 x 2 sterile gauze and Band-Aid    Post-procedure details: Patient was observed during the procedure. Post-procedure instructions were reviewed.  Patient left the clinic in stable condition.

## 2020-05-17 ENCOUNTER — Ambulatory Visit (INDEPENDENT_AMBULATORY_CARE_PROVIDER_SITE_OTHER): Payer: Medicare Other | Admitting: Internal Medicine

## 2020-05-17 ENCOUNTER — Ambulatory Visit: Payer: Medicare Other

## 2020-05-17 ENCOUNTER — Other Ambulatory Visit (INDEPENDENT_AMBULATORY_CARE_PROVIDER_SITE_OTHER): Payer: Medicare Other

## 2020-05-17 ENCOUNTER — Other Ambulatory Visit: Payer: Self-pay

## 2020-05-17 ENCOUNTER — Encounter: Payer: Self-pay | Admitting: Internal Medicine

## 2020-05-17 VITALS — BP 158/70 | HR 89 | Temp 98.0°F | Ht 65.0 in | Wt 164.0 lb

## 2020-05-17 DIAGNOSIS — I10 Essential (primary) hypertension: Secondary | ICD-10-CM

## 2020-05-17 DIAGNOSIS — R7989 Other specified abnormal findings of blood chemistry: Secondary | ICD-10-CM

## 2020-05-17 DIAGNOSIS — E785 Hyperlipidemia, unspecified: Secondary | ICD-10-CM | POA: Diagnosis not present

## 2020-05-17 DIAGNOSIS — N1832 Chronic kidney disease, stage 3b: Secondary | ICD-10-CM

## 2020-05-17 DIAGNOSIS — D500 Iron deficiency anemia secondary to blood loss (chronic): Secondary | ICD-10-CM

## 2020-05-17 DIAGNOSIS — Z0000 Encounter for general adult medical examination without abnormal findings: Secondary | ICD-10-CM

## 2020-05-17 DIAGNOSIS — E60 Dietary zinc deficiency: Secondary | ICD-10-CM | POA: Diagnosis not present

## 2020-05-17 DIAGNOSIS — I5189 Other ill-defined heart diseases: Secondary | ICD-10-CM

## 2020-05-17 DIAGNOSIS — I519 Heart disease, unspecified: Secondary | ICD-10-CM

## 2020-05-17 LAB — HEPATIC FUNCTION PANEL
ALT: 13 U/L (ref 0–35)
AST: 17 U/L (ref 0–37)
Albumin: 4.4 g/dL (ref 3.5–5.2)
Alkaline Phosphatase: 70 U/L (ref 39–117)
Bilirubin, Direct: 0.4 mg/dL — ABNORMAL HIGH (ref 0.0–0.3)
Total Bilirubin: 1.7 mg/dL — ABNORMAL HIGH (ref 0.2–1.2)
Total Protein: 6.5 g/dL (ref 6.0–8.3)

## 2020-05-17 LAB — CBC WITH DIFFERENTIAL/PLATELET
Basophils Absolute: 0.1 10*3/uL (ref 0.0–0.1)
Basophils Relative: 0.9 % (ref 0.0–3.0)
Eosinophils Absolute: 0.3 10*3/uL (ref 0.0–0.7)
Eosinophils Relative: 3.1 % (ref 0.0–5.0)
HCT: 28 % — ABNORMAL LOW (ref 36.0–46.0)
Hemoglobin: 9.9 g/dL — ABNORMAL LOW (ref 12.0–15.0)
Lymphocytes Relative: 13.3 % (ref 12.0–46.0)
Lymphs Abs: 1.1 10*3/uL (ref 0.7–4.0)
MCHC: 35.3 g/dL (ref 30.0–36.0)
MCV: 90.7 fl (ref 78.0–100.0)
Monocytes Absolute: 0.7 10*3/uL (ref 0.1–1.0)
Monocytes Relative: 8.7 % (ref 3.0–12.0)
Neutro Abs: 5.9 10*3/uL (ref 1.4–7.7)
Neutrophils Relative %: 74 % (ref 43.0–77.0)
Platelets: 207 10*3/uL (ref 150.0–400.0)
RBC: 3.09 Mil/uL — ABNORMAL LOW (ref 3.87–5.11)
RDW: 20.1 % — ABNORMAL HIGH (ref 11.5–15.5)
WBC: 8 10*3/uL (ref 4.0–10.5)

## 2020-05-17 LAB — BASIC METABOLIC PANEL
BUN: 31 mg/dL — ABNORMAL HIGH (ref 6–23)
CO2: 26 mEq/L (ref 19–32)
Calcium: 9.3 mg/dL (ref 8.4–10.5)
Chloride: 108 mEq/L (ref 96–112)
Creatinine, Ser: 1.17 mg/dL (ref 0.40–1.20)
GFR: 43.84 mL/min — ABNORMAL LOW (ref >60.00)
Glucose, Bld: 91 mg/dL (ref 70–99)
Potassium: 4.6 mEq/L (ref 3.5–5.1)
Sodium: 139 mEq/L (ref 135–145)

## 2020-05-17 LAB — URINALYSIS, ROUTINE W REFLEX MICROSCOPIC
Bilirubin Urine: NEGATIVE
Hgb urine dipstick: NEGATIVE
Ketones, ur: NEGATIVE
Nitrite: NEGATIVE
RBC / HPF: NONE SEEN (ref 0–?)
Specific Gravity, Urine: 1.015 (ref 1.000–1.030)
Total Protein, Urine: NEGATIVE
Urine Glucose: NEGATIVE
Urobilinogen, UA: 0.2 (ref 0.0–1.0)
pH: 6 (ref 5.0–8.0)

## 2020-05-17 LAB — IBC PANEL
Iron: 106 ug/dL (ref 42–145)
Saturation Ratios: 45.1 % (ref 20.0–50.0)
Transferrin: 168 mg/dL — ABNORMAL LOW (ref 212.0–360.0)

## 2020-05-17 LAB — TSH: TSH: 1.16 u[IU]/mL (ref 0.35–4.50)

## 2020-05-17 LAB — LIPID PANEL
Cholesterol: 108 mg/dL (ref 0–200)
HDL: 51.1 mg/dL (ref >39.00)
LDL Cholesterol: 43 mg/dL (ref 0–99)
NonHDL: 56.81
Total CHOL/HDL Ratio: 2
Triglycerides: 69 mg/dL (ref 0.0–149.0)
VLDL: 13.8 mg/dL (ref 0.0–40.0)

## 2020-05-17 LAB — PROTIME-INR
INR: 1.1 ratio — ABNORMAL HIGH (ref 0.8–1.0)
Prothrombin Time: 12.6 s (ref 9.6–13.1)

## 2020-05-17 LAB — FERRITIN: Ferritin: 1002.3 ng/mL — ABNORMAL HIGH (ref 10.0–291.0)

## 2020-05-17 NOTE — Progress Notes (Signed)
Subjective:  Patient ID: Claudia Lara, female    DOB: 1931/12/19  Age: 86 y.o. MRN: [id number]  CC: Annual Exam, Hypertension, and Anemia  This visit occurred during the SARS-CoV-2 public health emergency.  Safety protocols were in place, including screening questions prior to the visit, additional usage of staff PPE, and extensive cleaning of exam room while observing appropriate contact time as indicated for disinfecting solutions.    HPI [name] presents for a CPX.  She complains of her baseline level of fatigue, shortness of breath, and lower extremity edema.  There has been no recent worsening in any of the symptoms.  She is active and denies chest pain, palpitations, diaphoresis, dizziness, or lightheadedness.  Outpatient Medications Prior to Visit  Medication Sig Dispense Refill  . Calcium Carb-Cholecalciferol (CALCIUM CARBONATE-VITAMIN D3 PO) Take 1 tablet by mouth daily.     . carvedilol (COREG) 12.5 MG tablet Take 1 tablet (12.5 mg total) by mouth 2 (two) times daily with a meal. 180 tablet 1  . cholecalciferol (VITAMIN D) 1000 units tablet Take 1,000 Units by mouth daily.    . Multiple Vitamin (MULTIVITAMIN WITH MINERALS) TABS tablet Take 1 tablet by mouth daily.    . pantoprazole (PROTONIX) 40 MG tablet TAKE 1 TABLET BY MOUTH EVERY DAY 90 tablet 1  . torsemide (DEMADEX) 20 MG tablet TAKE 1 TABLET BY MOUTH EVERY DAY 90 tablet 1  . traMADol (ULTRAM) 50 MG tablet Take 1-2 tablets (50-100 mg total) by mouth every 12 (twelve) hours as needed for moderate pain. 56 tablet 0  . vitamin C (ASCORBIC ACID) 500 MG tablet Take 500 mg by mouth daily.     No facility-administered medications prior to visit.    ROS Review of Systems  Constitutional: Positive for fatigue. Negative for appetite change, diaphoresis and unexpected weight change.  HENT: Negative.   Eyes: Negative for visual disturbance.  Respiratory: Positive for shortness of breath. Negative for chest tightness and  wheezing.   Cardiovascular: Positive for leg swelling. Negative for chest pain and palpitations.  Gastrointestinal: Negative for abdominal pain, blood in stool, diarrhea, nausea and vomiting.  Endocrine: Negative.   Genitourinary: Negative.  Negative for difficulty urinating, dysuria, hematuria and urgency.  Musculoskeletal: Negative.   Skin: Negative.  Negative for color change and pallor.  Neurological: Negative.  Negative for dizziness, weakness, light-headedness, numbness and headaches.  Hematological: Negative for adenopathy. Does not bruise/bleed easily.  Psychiatric/Behavioral: Negative.     Objective:  BP (!) 158/70 (BP Location: Left Arm, Patient Position: Sitting, Cuff Size: Normal)   Pulse 89   Temp 98 F (36.7 C) (Oral)   Ht 5\' 5"  (1.651 m)   Wt 164 lb (74.4 kg)   SpO2 96%   BMI 27.29 kg/m   BP Readings from Last 3 Encounters:  05/17/20 (!) 158/70  02/10/20 (!) 156/58  01/27/20 128/70    Wt Readings from Last 3 Encounters:  05/17/20 164 lb (74.4 kg)  04/05/20 165 lb (74.8 kg)  02/10/20 166 lb 12 oz (75.6 kg)    Physical Exam Vitals reviewed.  Constitutional:      Appearance: Normal appearance. She is not ill-appearing.  HENT:     Nose: Nose normal.     Mouth/Throat:     Mouth: Mucous membranes are moist.     Pharynx: No posterior oropharyngeal erythema.  Eyes:     General: No scleral icterus.    Conjunctiva/sclera: Conjunctivae normal.  Cardiovascular:     Rate and Rhythm:  Normal rate and regular rhythm.     Heart sounds: S1 normal and S2 normal. Murmur heard.  Systolic murmur is present with a grade of 2/6.  No diastolic murmur is present.  No gallop.   Pulmonary:     Effort: Pulmonary effort is normal.     Breath sounds: No stridor. No wheezing, rhonchi or rales.  Abdominal:     General: Abdomen is protuberant. Bowel sounds are normal. There is no distension.     Palpations: There is no hepatomegaly, splenomegaly or mass.     Tenderness: There  is no abdominal tenderness.  Musculoskeletal:        General: Normal range of motion.     Cervical back: Neck supple.     Right lower leg: Edema (trace pitting) present.     Left lower leg: Edema (trace pitting) present.  Lymphadenopathy:     Cervical: No cervical adenopathy.  Skin:    General: Skin is warm and dry.     Coloration: Skin is not pale.  Neurological:     General: No focal deficit present.     Mental Status: She is alert.  Psychiatric:        Mood and Affect: Mood normal.        Behavior: Behavior normal.     Lab Results  Component Value Date   WBC 8.0 05/17/2020   HGB 9.9 (L) 05/17/2020   HCT 28.0 (L) 05/17/2020   PLT 207.0 05/17/2020   GLUCOSE 91 05/17/2020   CHOL 108 05/17/2020   TRIG 69.0 05/17/2020   HDL 51.10 05/17/2020   LDLDIRECT 56.0 07/08/2018   LDLCALC 43 05/17/2020   ALT 13 05/17/2020   AST 17 05/17/2020   NA 139 05/17/2020   K 4.6 05/17/2020   CL 108 05/17/2020   CREATININE 1.17 05/17/2020   BUN 31 (H) 05/17/2020   CO2 26 05/17/2020   TSH 1.16 05/17/2020   INR 1.1 (H) 05/17/2020   HGBA1C 4.8 06/08/2019    No results found.  Assessment & Plan:   Lauralynn was seen today for annual exam, hypertension and anemia.  Diagnoses and all orders for this visit:  Stage 3b chronic kidney disease- Her renal function is stable and her blood pressure is adequately well controlled.  She agrees to avoid nephrotoxic agents. -     Basic metabolic panel; Future -     Urinalysis, Routine w reflex microscopic; Future  Elevated LFTs- Her LFTs are normal now. -     Hepatic function panel; Future -     Protime-INR; Future  Iron deficiency anemia due to chronic blood loss- Her H&H have improved.  Her iron level is normal.  Her ferritin level is high.  I think the chronic anemia is combination of anemia chronic disease and intermittent hemolysis.  No intervention is needed at this time. -     IBC panel; Future -     Ferritin; Future  Essential  hypertension- Based on her age and comorbid illnesses her blood pressure is adequately well controlled. -     CBC with Differential/Platelet; Future -     TSH; Future  Left ventricular diastolic dysfunction, NYHA class 1- She has mild, stable symptoms related to this.  We will continue the current dose of the loop diuretic.  Zinc deficiency-her zinc level is normal now. -     Zinc; Future  Hyperlipidemia with target LDL less than 130- Statin therapy is not indicated. -     Lipid panel; Future  Routine health maintenance- Exam completed, labs reviewed, vaccines reviewed and updated, no cancer screenings are indicated, patient education material was given.   I am having Jennette Banker maintain her vitamin C, cholecalciferol, Calcium Carb-Cholecalciferol (CALCIUM CARBONATE-VITAMIN D3 PO), multivitamin with minerals, traMADol, torsemide, pantoprazole, and carvedilol.  No orders of the defined types were placed in this encounter.  In addition to time spent on CPE, I spent 50 minutes in preparing to see the patient by review of recent labs, imaging and procedures, obtaining and reviewing separately obtained history, communicating with the patient and family or caregiver, ordering medications, tests or procedures, and documenting clinical information in the EHR including the differential Dx, treatment, and any further evaluation and other management of 1. Stage 3b chronic kidney disease 2. Elevated LFTs 3. Iron deficiency anemia due to chronic blood loss 4. Essential hypertension 5. Left ventricular diastolic dysfunction, NYHA class 1 6. Zinc deficiency 7. Hyperlipidemia with target LDL less than 130    Follow-up: Return in about 6 months (around 11/16/2020).  [name], MD

## 2020-05-17 NOTE — Patient Instructions (Signed)
Health Maintenance, Female Adopting a healthy lifestyle and getting preventive care are important in promoting health and wellness. Ask your health care provider about:  The right schedule for you to have regular tests and exams.  Things you can do on your own to prevent diseases and keep yourself healthy. What should I know about diet, weight, and exercise? Eat a healthy diet   Eat a diet that includes plenty of vegetables, fruits, low-fat dairy products, and lean protein.  Do not eat a lot of foods that are high in solid fats, added sugars, or sodium. Maintain a healthy weight Body mass index (BMI) is used to identify weight problems. It estimates body fat based on height and weight. Your health care provider can help determine your BMI and help you achieve or maintain a healthy weight. Get regular exercise Get regular exercise. This is one of the most important things you can do for your health. Most adults should:  Exercise for at least 150 minutes each week. The exercise should increase your heart rate and make you sweat (moderate-intensity exercise).  Do strengthening exercises at least twice a week. This is in addition to the moderate-intensity exercise.  Spend less time sitting. Even light physical activity can be beneficial. Watch cholesterol and blood lipids Have your blood tested for lipids and cholesterol at 84 years of age, then have this test every 5 years. Have your cholesterol levels checked more often if:  Your lipid or cholesterol levels are high.  You are older than 84 years of age.  You are at high risk for heart disease. What should I know about cancer screening? Depending on your health history and family history, you may need to have cancer screening at various ages. This may include screening for:  Breast cancer.  Cervical cancer.  Colorectal cancer.  Skin cancer.  Lung cancer. What should I know about heart disease, diabetes, and high blood  pressure? Blood pressure and heart disease  High blood pressure causes heart disease and increases the risk of stroke. This is more likely to develop in people who have high blood pressure readings, are of African descent, or are overweight.  Have your blood pressure checked: ? Every 3-5 years if you are 18-39 years of age. ? Every year if you are 40 years old or older. Diabetes Have regular diabetes screenings. This checks your fasting blood sugar level. Have the screening done:  Once every three years after age 40 if you are at a normal weight and have a low risk for diabetes.  More often and at a younger age if you are overweight or have a high risk for diabetes. What should I know about preventing infection? Hepatitis B If you have a higher risk for hepatitis B, you should be screened for this virus. Talk with your health care provider to find out if you are at risk for hepatitis B infection. Hepatitis C Testing is recommended for:  Everyone born from 1945 through 1965.  Anyone with known risk factors for hepatitis C. Sexually transmitted infections (STIs)  Get screened for STIs, including gonorrhea and chlamydia, if: ? You are sexually active and are younger than 84 years of age. ? You are older than 84 years of age and your health care provider tells you that you are at risk for this type of infection. ? Your sexual activity has changed since you were last screened, and you are at increased risk for chlamydia or gonorrhea. Ask your health care provider if   you are at risk.  Ask your health care provider about whether you are at high risk for HIV. Your health care provider may recommend a prescription medicine to help prevent HIV infection. If you choose to take medicine to prevent HIV, you should first get tested for HIV. You should then be tested every 3 months for as long as you are taking the medicine. Pregnancy  If you are about to stop having your period (premenopausal) and  you may become pregnant, seek counseling before you get pregnant.  Take 400 to 800 micrograms (mcg) of folic acid every day if you become pregnant.  Ask for birth control (contraception) if you want to prevent pregnancy. Osteoporosis and menopause Osteoporosis is a disease in which the bones lose minerals and strength with aging. This can result in bone fractures. If you are 65 years old or older, or if you are at risk for osteoporosis and fractures, ask your health care provider if you should:  Be screened for bone loss.  Take a calcium or vitamin D supplement to lower your risk of fractures.  Be given hormone replacement therapy (HRT) to treat symptoms of menopause. Follow these instructions at home: Lifestyle  Do not use any products that contain nicotine or tobacco, such as cigarettes, e-cigarettes, and chewing tobacco. If you need help quitting, ask your health care provider.  Do not use street drugs.  Do not share needles.  Ask your health care provider for help if you need support or information about quitting drugs. Alcohol use  Do not drink alcohol if: ? Your health care provider tells you not to drink. ? You are pregnant, may be pregnant, or are planning to become pregnant.  If you drink alcohol: ? Limit how much you use to 0-1 drink a day. ? Limit intake if you are breastfeeding.  Be aware of how much alcohol is in your drink. In the [place] one drink equals one 12 oz bottle of beer (355 mL), one 5 oz glass of wine (148 mL), or one 1 oz glass of hard liquor (44 mL). General instructions  Schedule regular health, dental, and eye exams.  Stay current with your vaccines.  Tell your health care provider if: ? You often feel depressed. ? You have ever been abused or do not feel safe at home. Summary  Adopting a healthy lifestyle and getting preventive care are important in promoting health and wellness.  Follow your health care provider's instructions about healthy  diet, exercising, and getting tested or screened for diseases.  Follow your health care provider's instructions on monitoring your cholesterol and blood pressure. This information is not intended to replace advice given to you by your health care provider. Make sure you discuss any questions you have with your health care provider. Document Revised: 11/04/2018 Document Reviewed: 11/04/2018 Elsevier Patient Education  2020 [employer]  

## 2020-05-19 LAB — ZINC: Zinc: 77 ug/dL (ref 60–130)

## 2020-05-20 ENCOUNTER — Other Ambulatory Visit: Payer: Self-pay | Admitting: Internal Medicine

## 2020-05-20 DIAGNOSIS — K219 Gastro-esophageal reflux disease without esophagitis: Secondary | ICD-10-CM

## 2020-05-26 ENCOUNTER — Other Ambulatory Visit: Payer: Self-pay

## 2020-05-26 ENCOUNTER — Ambulatory Visit: Payer: Medicare Other | Admitting: Physical Medicine and Rehabilitation

## 2020-05-26 ENCOUNTER — Encounter: Payer: Self-pay | Admitting: Physical Medicine and Rehabilitation

## 2020-05-26 DIAGNOSIS — R202 Paresthesia of skin: Secondary | ICD-10-CM | POA: Diagnosis not present

## 2020-05-26 NOTE — Progress Notes (Signed)
Pt is here for a NCS and states numbness and tingling in the left hand (all fingers). Pt states symptoms started weeks ago. Symptoms are there all the time with any activity. Nothing helps with symptoms. Right hand dominant. Pt hands been washed.  .Numeric Pain Rating Scale and Functional Assessment Average Pain 0   In the last MONTH (on 0-10 scale) has pain interfered with the following?  1. General activity like being  able to carry out your everyday physical activities such as walking, climbing stairs, carrying groceries, or moving a chair?  Rating(0)   +Driver, -BT, -Dye Allergies.

## 2020-05-26 NOTE — Procedures (Signed)
EMG & NCV Findings: Evaluation of the left median motor nerve showed prolonged distal onset latency (5.9 ms), reduced amplitude (0.0 mV), and decreased conduction velocity (Elbow-Wrist, 39 m/s).  The left median (across palm) sensory nerve showed no response (Wrist) and no response (Palm).  The left ulnar sensory nerve showed prolonged distal peak latency (3.9 ms), reduced amplitude (6.9 V), and decreased conduction velocity (Wrist-5th Digit, 36 m/s).  All remaining nerves (as indicated in the following tables) were within normal limits.    Needle evaluation of the left abductor pollicis brevis muscle showed decreased insertional activity, widespread spontaneous activity, and diminished recruitment.  All remaining muscles (as indicated in the following table) showed no evidence of electrical instability.    Impression: The above electrodiagnostic study is ABNORMAL and reveals evidence of a severe left median nerve entrapment at the wrist (carpal tunnel syndrome) affecting sensory and motor components. The lesion is characterized by sensory and motor demyelination with evidence of significant axonal injury.  Even with appropriate decompression and maybe some residual permanent symptoms.  There is no other significant evidence of cervical radiculopathy or brachial plexopathy.  Recommendations: 1.  Follow-up with referring physician. 2.  Continue current management of symptoms. 3.  Suggest surgical evaluation.  ___________________________ Laurence Spates FAAPMR Board Certified, American Board of Physical Medicine and Rehabilitation    Nerve Conduction Studies Anti Sensory Summary Table   Stim Site NR Peak (ms) Norm Peak (ms) P-T Amp (V) Norm P-T Amp Site1 Site2 Delta-P (ms) Dist (cm) Vel (m/s) Norm Vel (m/s)  Left Median Acr Palm Anti Sensory (2nd Digit)  32.4C  Wrist *NR  <3.6  >10 Wrist Palm  0.0    Palm *NR  <2.0              11.3  5.4         Left Radial Anti Sensory (Base 1st Digit)   32.1C  Wrist    2.3 <3.1 15.0  Wrist Base 1st Digit 2.3 0.0    Left Ulnar Anti Sensory (5th Digit)  32.4C  Wrist    *3.9 <3.7 *6.9 >15.0 Wrist 5th Digit 3.9 14.0 *36 >38   Motor Summary Table   Stim Site NR Onset (ms) Norm Onset (ms) O-P Amp (mV) Norm O-P Amp Site1 Site2 Delta-0 (ms) Dist (cm) Vel (m/s) Norm Vel (m/s)  Left Median Motor (Abd Poll Brev)  32.6C  Wrist    *5.9 <4.2 *0.0 >5 Elbow Wrist 5.3 20.5 *39 >50  Elbow    11.2  0.0         Left Ulnar Motor (Abd Dig Min)  32.7C  Wrist    3.3 <4.2 3.4 >3 B Elbow Wrist 3.4 19.0 56 >53  B Elbow    6.7  2.5  A Elbow B Elbow 1.0 9.0 90 >53  A Elbow    7.7  2.2          EMG   Side Muscle Nerve Root Ins Act Fibs Psw Amp Dur Poly Recrt Int Fraser Din Comment  Left Abd Poll Brev Median C8-T1 *Decr *4+ *4+ Nml Nml 0 *Reduced Nml + MUAP very few  Left 1stDorInt Ulnar C8-T1 Nml Nml Nml Nml Nml 0 Nml Nml   Left PronatorTeres Median C6-7 Nml Nml Nml Nml Nml 0 Nml Nml     Nerve Conduction Studies Anti Sensory Left/Right Comparison   Stim Site L Lat (ms) R Lat (ms) L-R Lat (ms) L Amp (V) R Amp (V) L-R Amp (%) Site1 Site2 L  Vel (m/s) R Vel (m/s) L-R Vel (m/s)  Median Acr Palm Anti Sensory (2nd Digit)  32.4C  Wrist       Wrist Palm     Palm              11.3   5.4         Radial Anti Sensory (Base 1st Digit)  32.1C  Wrist 2.3   15.0   Wrist Base 1st Digit     Ulnar Anti Sensory (5th Digit)  32.4C  Wrist *3.9   *6.9   Wrist 5th Digit *36     Motor Left/Right Comparison   Stim Site L Lat (ms) R Lat (ms) L-R Lat (ms) L Amp (mV) R Amp (mV) L-R Amp (%) Site1 Site2 L Vel (m/s) R Vel (m/s) L-R Vel (m/s)  Median Motor (Abd Poll Brev)  32.6C  Wrist *5.9   *0.0   Elbow Wrist *39    Elbow 11.2   0.0         Ulnar Motor (Abd Dig Min)  32.7C  Wrist 3.3   3.4   B Elbow Wrist 56    B Elbow 6.7   2.5   A Elbow B Elbow 90    A Elbow 7.7   2.2            Waveforms:

## 2020-05-30 NOTE — Progress Notes (Signed)
Claudia Lara - 86 y.o. female MRN [id number]  Date of birth: 1931/12/03  Office Visit Note: Visit Date: 05/26/2020 PCP: Janith Lima, MD Referred by: Janith Lima, MD  Subjective: Chief Complaint  Patient presents with  . Left Hand - Numbness, Tingling   HPI:  Claudia Lara is a 86 y.o. female who comes in today at the request of Dr. Joni Fears for electrodiagnostic study of the Left upper extremities.  Patient is Right hand dominant.  Patient had prior carpal tunnel release on the right several years ago with prior electrodiagnostic study showing severe median nerve neuropathy at the wrist.  At the time the left hand was not completed because of lack of symptoms.  She has now gone on to develop really severe symptoms on the left.  She reports numbness and tingling in the left hand and reports this globally at this point and constant.  She says it can be there with any activity and worsen with any activity.  She reports nothing really has helped.  She denies much in the way of pain but a lot of numbness and weakness.  She has significant osteoarthritis of all of the joints in the hands bilaterally.  There is atrophy in the APB muscle.  She reports no radicular complaints.  ROS Otherwise per HPI.  Assessment & Plan: Visit Diagnoses:  1. Paresthesia of skin     Plan: Impression: The above electrodiagnostic study is ABNORMAL and reveals evidence of a severe left median nerve entrapment at the wrist (carpal tunnel syndrome) affecting sensory and motor components. The lesion is characterized by sensory and motor demyelination with evidence of significant axonal injury.  Even with appropriate decompression and maybe some residual permanent symptoms.  There is no other significant evidence of cervical radiculopathy or brachial plexopathy.  Recommendations: 1.  Follow-up with referring physician. 2.  Continue current management of symptoms. 3.  Suggest surgical evaluation.  Meds  & Orders: No orders of the defined types were placed in this encounter.   Orders Placed This Encounter  Procedures  . NCV with EMG (electromyography)    Follow-up: Return for Joni Fears, MD.   Procedures: No procedures performed  EMG & NCV Findings: Evaluation of the left median motor nerve showed prolonged distal onset latency (5.9 ms), reduced amplitude (0.0 mV), and decreased conduction velocity (Elbow-Wrist, 39 m/s).  The left median (across palm) sensory nerve showed no response (Wrist) and no response (Palm).  The left ulnar sensory nerve showed prolonged distal peak latency (3.9 ms), reduced amplitude (6.9 V), and decreased conduction velocity (Wrist-5th Digit, 36 m/s).  All remaining nerves (as indicated in the following tables) were within normal limits.    Needle evaluation of the left abductor pollicis brevis muscle showed decreased insertional activity, widespread spontaneous activity, and diminished recruitment.  All remaining muscles (as indicated in the following table) showed no evidence of electrical instability.    Impression: The above electrodiagnostic study is ABNORMAL and reveals evidence of a severe left median nerve entrapment at the wrist (carpal tunnel syndrome) affecting sensory and motor components. The lesion is characterized by sensory and motor demyelination with evidence of significant axonal injury.  Even with appropriate decompression and maybe some residual permanent symptoms.  There is no other significant evidence of cervical radiculopathy or brachial plexopathy.  Recommendations: 1.  Follow-up with referring physician. 2.  Continue current management of symptoms. 3.  Suggest surgical evaluation.  ___________________________ [employer] Board Certified, American Board of  Physical Medicine and Rehabilitation    Nerve Conduction Studies Anti Sensory Summary Table   Stim Site NR Peak (ms) Norm Peak (ms) P-T Amp (V) Norm P-T Amp Site1  Site2 Delta-P (ms) Dist (cm) Vel (m/s) Norm Vel (m/s)  Left Median Acr Palm Anti Sensory (2nd Digit)  32.4C  Wrist *NR  <3.6  >10 Wrist Palm  0.0    Palm *NR  <2.0              11.3  5.4         Left Radial Anti Sensory (Base 1st Digit)  32.1C  Wrist    2.3 <3.1 15.0  Wrist Base 1st Digit 2.3 0.0    Left Ulnar Anti Sensory (5th Digit)  32.4C  Wrist    *3.9 <3.7 *6.9 >15.0 Wrist 5th Digit 3.9 14.0 *36 >38   Motor Summary Table   Stim Site NR Onset (ms) Norm Onset (ms) O-P Amp (mV) Norm O-P Amp Site1 Site2 Delta-0 (ms) Dist (cm) Vel (m/s) Norm Vel (m/s)  Left Median Motor (Abd Poll Brev)  32.6C  Wrist    *5.9 <4.2 *0.0 >5 Elbow Wrist 5.3 20.5 *39 >50  Elbow    11.2  0.0         Left Ulnar Motor (Abd Dig Min)  32.7C  Wrist    3.3 <4.2 3.4 >3 B Elbow Wrist 3.4 19.0 56 >53  B Elbow    6.7  2.5  A Elbow B Elbow 1.0 9.0 90 >53  A Elbow    7.7  2.2          EMG   Side Muscle Nerve Root Ins Act Fibs Psw Amp Dur Poly Recrt Int Fraser Din Comment  Left Abd Poll Brev Median C8-T1 *Decr *4+ *4+ Nml Nml 0 *Reduced Nml + MUAP very few  Left 1stDorInt Ulnar C8-T1 Nml Nml Nml Nml Nml 0 Nml Nml   Left PronatorTeres Median C6-7 Nml Nml Nml Nml Nml 0 Nml Nml     Nerve Conduction Studies Anti Sensory Left/Right Comparison   Stim Site L Lat (ms) R Lat (ms) L-R Lat (ms) L Amp (V) R Amp (V) L-R Amp (%) Site1 Site2 L Vel (m/s) R Vel (m/s) L-R Vel (m/s)  Median Acr Palm Anti Sensory (2nd Digit)  32.4C  Wrist       Wrist Palm     Palm              11.3   5.4         Radial Anti Sensory (Base 1st Digit)  32.1C  Wrist 2.3   15.0   Wrist Base 1st Digit     Ulnar Anti Sensory (5th Digit)  32.4C  Wrist *3.9   *6.9   Wrist 5th Digit *36     Motor Left/Right Comparison   Stim Site L Lat (ms) R Lat (ms) L-R Lat (ms) L Amp (mV) R Amp (mV) L-R Amp (%) Site1 Site2 L Vel (m/s) R Vel (m/s) L-R Vel (m/s)  Median Motor (Abd Poll Brev)  32.6C  Wrist *5.9   *0.0   Elbow Wrist *39    Elbow 11.2   0.0           Ulnar Motor (Abd Dig Min)  32.7C  Wrist 3.3   3.4   B Elbow Wrist 56    B Elbow 6.7   2.5   A Elbow B Elbow 90    A Elbow 7.7   2.2  Waveforms:             Clinical History: CT LUMBAR MYELOGRAM FINDINGS:  The lumbar spine is imaged from the midbody of T12 through S2-3.  Slight retrolisthesis is present at L2-3. Slight anterolisthesis is present at L4-5. There is no significant interval change.  Atherosclerotic calcifications are present in the aorta without aneurysm. Cholecystectomy is noted. No other discrete solid organ lesions are present. There is no significant adenopathy. Multiple subcentimeter para-aortic nodes are present.  L1-2: A broad-based disc protrusion is present. Moderate facet hypertrophy is noted bilaterally. Mild subarticular narrowing is worse on the left. Mild foraminal narrowing is worse on the left. There is no significant change.  L2-3: A broad-based disc protrusion is present. Moderate facet hypertrophy and ligamentum flavum thickening is noted. Moderate subarticular narrowing is worse on the right. This has progressed some. Moderate right and mild left foraminal narrowing is present.  L3-4: A broad-based disc protrusion is present. Advanced facet hypertrophy has progressed. This results in moderate right and mild left subarticular narrowing with progression. Moderate right and mild left foraminal narrowing has progressed some as well.  L4-5: A broad-based disc protrusion is present. Advanced facet hypertrophy is noted. Severe right and moderate left foraminal stenosis is present. Progressive moderate foraminal narrowing is again seen bilaterally, right greater than left.  L5-S1: A prominent central disc protrusion is present. The Moderate facet hypertrophy and spurring has progressed. Moderate subarticular narrowing is worse on the left. Moderate foraminal narrowing is also worse on the left without definite  change.  IMPRESSION: 1. Progressive multilevel spondylosis of the lumbar spine. 2. The L3-4 disc protrusion is worse with standing. 3. Other disc disease does not significantly change with standing. There is no abnormal motion with standing. 4.  Aortic Atherosclerosis (ICD10-I70.0). 5. Mild subarticular and foraminal narrowing at L1-2 is stable, worse on the left. 6. Moderate subarticular stenosis at L2-3 has progressed. There is also progressive moderate right and mild left foraminal narrowing at the same level. 7. Moderate right and mild left subarticular and foraminal stenosis at L3-4 has progressed. 8. The severe right and moderate left foraminal stenosis and moderate bilateral foraminal narrowing at L4-5 has progressed. 9. Progressive facet disease L5 to S for leading to moderate subarticular and foraminal stenosis, left greater than right. Subarticular stenosis has progressed. Foraminal narrowing is stable.   Electronically Signed   By: San Morelle M.D.   On: 12/01/2017 13:23     Objective:  VS:  HT:    WT:   BMI:     BP:   HR: bpm  TEMP: ( )  RESP:  Physical Exam Musculoskeletal:        General: No swelling, tenderness or deformity.     Comments: Inspection reveals atrophy of the left and right APB with prior carpal tunnel release scar on the right wrist but no atrophy of the bilateral FDI or hand intrinsics. There is no swelling, color changes, allodynia or dystrophic changes. There is 5 out of 5 strength in the bilateral wrist extension, finger abduction and long finger flexion. There is impaired sensation to light touch in the median nerve distribution on the left. There is a negative Phalen's test bilaterally. There is a negative Hoffmann's test bilaterally.  Skin:    General: Skin is warm and dry.     Findings: No erythema or rash.  Neurological:     General: No focal deficit present.     Mental Status: She is alert and oriented to  person, place,  and time.     Motor: No weakness or abnormal muscle tone.     Coordination: Coordination normal.  Psychiatric:        Mood and Affect: Mood normal.        Behavior: Behavior normal.      Imaging: No results found.

## 2020-05-31 ENCOUNTER — Ambulatory Visit (INDEPENDENT_AMBULATORY_CARE_PROVIDER_SITE_OTHER): Payer: Medicare Other

## 2020-05-31 ENCOUNTER — Other Ambulatory Visit: Payer: Self-pay

## 2020-05-31 VITALS — Ht 65.0 in | Wt 164.4 lb

## 2020-05-31 DIAGNOSIS — Z0000 Encounter for general adult medical examination without abnormal findings: Secondary | ICD-10-CM

## 2020-05-31 NOTE — Patient Instructions (Signed)
Claudia Lara , Thank you for taking time to come for your Medicare Wellness Visit. I appreciate your ongoing commitment to your health goals. Please review the following plan we discussed and let me know if I can assist you in the future.   Screening recommendations/referrals: Colonoscopy: not a candidate for colon cancer screening Mammogram: 10/28/2016 Bone Density: 04/28/2014 Recommended yearly ophthalmology/optometry visit for glaucoma screening and checkup Recommended yearly dental visit for hygiene and checkup  Vaccinations: Influenza vaccine: 08/04/2019 Pneumococcal vaccine: completed Tdap vaccine: 07/16/2012; due every 10 years Shingles vaccine: not completed (recv'd on 04/19/2020)   Covid-19: completed Pfizer  Advanced directives: Please bring a copy of your health care power of attorney and living will to the office at your convenience.   Conditions/risks identified: Please continue to do your personal lifestyle choices by: daily care of teeth and gums, regular physical activity (goal should be 5 days a week for 30 minutes), eat a healthy diet, avoid tobacco and drug use, limiting any alcohol intake, taking a low-dose aspirin (if not allergic or have been advised by your provider otherwise) and taking vitamins and minerals as recommended by your provider. Continue doing brain stimulating activities (puzzles, reading, adult coloring books, staying active) to keep memory sharp. Continue to eat heart healthy diet (full of fruits, vegetables, whole grains, lean protein, water--limit salt, fat, and sugar intake) and increase physical activity as tolerated.  Next appointment: Please schedule your next Medicare Wellness Visit with your Nurse Health Advisor in 1 year.   Preventive Care 68 Years and Older, Female Preventive care refers to lifestyle choices and visits with your health care provider that can promote health and wellness. What does preventive care include?  A yearly physical exam.  This is also called an annual well check.  Dental exams once or twice a year.  Routine eye exams. Ask your health care provider how often you should have your eyes checked.  Personal lifestyle choices, including:  Daily care of your teeth and gums.  Regular physical activity.  Eating a healthy diet.  Avoiding tobacco and drug use.  Limiting alcohol use.  Practicing safe sex.  Taking low-dose aspirin every day.  Taking vitamin and mineral supplements as recommended by your health care provider. What happens during an annual well check? The services and screenings done by your health care provider during your annual well check will depend on your age, overall health, lifestyle risk factors, and family history of disease. Counseling  Your health care provider may ask you questions about your:  Alcohol use.  Tobacco use.  Drug use.  Emotional well-being.  Home and relationship well-being.  Sexual activity.  Eating habits.  History of falls.  Memory and ability to understand (cognition).  Work and work Statistician.  Reproductive health. Screening  You may have the following tests or measurements:  Height, weight, and BMI.  Blood pressure.  Lipid and cholesterol levels. These may be checked every 5 years, or more frequently if you are over 52 years old.  Skin check.  Lung cancer screening. You may have this screening every year starting at age 54 if you have a 30-pack-year history of smoking and currently smoke or have quit within the past 15 years.  Fecal occult blood test (FOBT) of the stool. You may have this test every year starting at age 49.  Flexible sigmoidoscopy or colonoscopy. You may have a sigmoidoscopy every 5 years or a colonoscopy every 10 years starting at age 103.  Hepatitis C blood test.  Hepatitis B blood test.  Sexually transmitted disease (STD) testing.  Diabetes screening. This is done by checking your blood sugar (glucose) after  you have not eaten for a while (fasting). You may have this done every 1-3 years.  Bone density scan. This is done to screen for osteoporosis. You may have this done starting at age 42.  Mammogram. This may be done every 1-2 years. Talk to your health care provider about how often you should have regular mammograms. Talk with your health care provider about your test results, treatment options, and if necessary, the need for more tests. Vaccines  Your health care provider may recommend certain vaccines, such as:  Influenza vaccine. This is recommended every year.  Tetanus, diphtheria, and acellular pertussis (Tdap, Td) vaccine. You may need a Td booster every 10 years.  Zoster vaccine. You may need this after age 6.  Pneumococcal 13-valent conjugate (PCV13) vaccine. One dose is recommended after age 12.  Pneumococcal polysaccharide (PPSV23) vaccine. One dose is recommended after age 28. Talk to your health care provider about which screenings and vaccines you need and how often you need them. This information is not intended to replace advice given to you by your health care provider. Make sure you discuss any questions you have with your health care provider. Document Released: 12/08/2015 Document Revised: 07/31/2016 Document Reviewed: 09/12/2015 Elsevier Interactive Patient Education  2017 [employer] Prevention in the Home Falls can cause injuries. They can happen to people of all ages. There are many things you can do to make your home safe and to help prevent falls. What can I do on the outside of my home?  Regularly fix the edges of walkways and driveways and fix any cracks.  Remove anything that might make you trip as you walk through a door, such as a raised step or threshold.  Trim any bushes or trees on the path to your home.  Use bright outdoor lighting.  Clear any walking paths of anything that might make someone trip, such as rocks or tools.  Regularly  check to see if handrails are loose or broken. Make sure that both sides of any steps have handrails.  Any raised decks and porches should have guardrails on the edges.  Have any leaves, snow, or ice cleared regularly.  Use sand or salt on walking paths during winter.  Clean up any spills in your garage right away. This includes oil or grease spills. What can I do in the bathroom?  Use night lights.  Install grab bars by the toilet and in the tub and shower. Do not use towel bars as grab bars.  Use non-skid mats or decals in the tub or shower.  If you need to sit down in the shower, use a plastic, non-slip stool.  Keep the floor dry. Clean up any water that spills on the floor as soon as it happens.  Remove soap buildup in the tub or shower regularly.  Attach bath mats securely with double-sided non-slip rug tape.  Do not have throw rugs and other things on the floor that can make you trip. What can I do in the bedroom?  Use night lights.  Make sure that you have a light by your bed that is easy to reach.  Do not use any sheets or blankets that are too big for your bed. They should not hang down onto the floor.  Have a firm chair that has side arms. You can use this  for support while you get dressed.  Do not have throw rugs and other things on the floor that can make you trip. What can I do in the kitchen?  Clean up any spills right away.  Avoid walking on wet floors.  Keep items that you use a lot in easy-to-reach places.  If you need to reach something above you, use a strong step stool that has a grab bar.  Keep electrical cords out of the way.  Do not use floor polish or wax that makes floors slippery. If you must use wax, use non-skid floor wax.  Do not have throw rugs and other things on the floor that can make you trip. What can I do with my stairs?  Do not leave any items on the stairs.  Make sure that there are handrails on both sides of the stairs and  use them. Fix handrails that are broken or loose. Make sure that handrails are as long as the stairways.  Check any carpeting to make sure that it is firmly attached to the stairs. Fix any carpet that is loose or worn.  Avoid having throw rugs at the top or bottom of the stairs. If you do have throw rugs, attach them to the floor with carpet tape.  Make sure that you have a light switch at the top of the stairs and the bottom of the stairs. If you do not have them, ask someone to add them for you. What else can I do to help prevent falls?  Wear shoes that:  Do not have high heels.  Have rubber bottoms.  Are comfortable and fit you well.  Are closed at the toe. Do not wear sandals.  If you use a stepladder:  Make sure that it is fully opened. Do not climb a closed stepladder.  Make sure that both sides of the stepladder are locked into place.  Ask someone to hold it for you, if possible.  Clearly mark and make sure that you can see:  Any grab bars or handrails.  First and last steps.  Where the edge of each step is.  Use tools that help you move around (mobility aids) if they are needed. These include:  Canes.  Walkers.  Scooters.  Crutches.  Turn on the lights when you go into a dark area. Replace any light bulbs as soon as they burn out.  Set up your furniture so you have a clear path. Avoid moving your furniture around.  If any of your floors are uneven, fix them.  If there are any pets around you, be aware of where they are.  Review your medicines with your doctor. Some medicines can make you feel dizzy. This can increase your chance of falling. Ask your doctor what other things that you can do to help prevent falls. This information is not intended to replace advice given to you by your health care provider. Make sure you discuss any questions you have with your health care provider. Document Released: 09/07/2009 Document Revised: 04/18/2016 Document  Reviewed: 12/16/2014 Elsevier Interactive Patient Education  2017 [employer].

## 2020-05-31 NOTE — Progress Notes (Signed)
Subjective:   Claudia Lara is a 86 y.o. female who presents for Medicare Annual (Subsequent) preventive examination.  Review of Systems    No ROS. Medicare Wellness Visit. Additional risk factors are reflected in social history.  Cardiac Risk Factors include: advanced age (>28men, >36 women);dyslipidemia;hypertension     Objective:    Today's Vitals   05/31/20 1024  Weight: 164 lb 6.4 oz (74.6 kg)  Height: 5\' 5"  (1.651 m)   Body mass index is 27.36 kg/m.  Advanced Directives 05/31/2020 08/31/2019 08/30/2019 08/04/2019 08/04/2019 07/28/2019 07/26/2019  Does Patient Have a Medical Advance Directive? Yes Yes Yes Yes Yes Yes Yes  Type of Advance Directive - [place];Living will Living will [place];Living will Living will Living will Living will  Does patient want to make changes to medical advance directive? No - Patient declined No - Patient declined - No - Patient declined - No - Patient declined No - Patient declined  Copy of Healthcare Power of Attorney in Chart? - No - copy requested - No - copy requested - No - copy requested No - copy requested  Would patient like information on creating a medical advance directive? - - - - - - -  Pre-existing out of facility DNR order (yellow form or pink MOST form) - - - - - - -    Current Medications (verified) Outpatient Encounter Medications as of 05/31/2020  Medication Sig  . Calcium Carb-Cholecalciferol (CALCIUM CARBONATE-VITAMIN D3 PO) Take 1 tablet by mouth daily.   . carvedilol (COREG) 12.5 MG tablet Take 1 tablet (12.5 mg total) by mouth 2 (two) times daily with a meal.  . cholecalciferol (VITAMIN D) 1000 units tablet Take 1,000 Units by mouth daily.  . Multiple Vitamin (MULTIVITAMIN WITH MINERALS) TABS tablet Take 1 tablet by mouth daily.  [name] torsemide (DEMADEX) 20 MG tablet TAKE 1 TABLET BY MOUTH EVERY DAY  . traMADol (ULTRAM) 50 MG tablet Take 1-2 tablets (50-100 mg total) by mouth every 12  (twelve) hours as needed for moderate pain.  . vitamin C (ASCORBIC ACID) 500 MG tablet Take 500 mg by mouth daily.  . pantoprazole (PROTONIX) 40 MG tablet TAKE 1 TABLET BY MOUTH EVERY DAY (Patient not taking: Reported on 05/31/2020)  . [DISCONTINUED] pantoprazole (PROTONIX) 40 MG tablet TAKE 1 TABLET BY MOUTH EVERY DAY   No facility-administered encounter medications on file as of 05/31/2020.    Allergies (verified) Sulfonamide derivatives   History: Past Medical History:  Diagnosis Date  . ANEMIA 08/22/2009  . Aortic stenosis   . AR (aortic regurgitation) 01/2019   Mild to Moderate, Noted on ECHO  . CAROTID ARTERY STENOSIS 01/16/2010  . Complication of anesthesia    very hard to wake up from surgery  . DYSPNEA ON EXERTION 11/16/2007  . Gallstones   . GEN OSTEOARTHROSIS INVOLVING MULTIPLE SITES 11/11/2008  . GERD (gastroesophageal reflux disease)   . Grade I diastolic dysfunction 58/5277   Noted on ECHO  . Heart murmur   . HEMORRHOIDS, INTERNAL    surgery was in the 90's (per patient)  . History of blood transfusion    after hip and knee replacements  . NECK PAIN, ACUTE 12/28/2009  . PERIPHERAL EDEMA 10/06/2007  . PONV (postoperative nausea and vomiting)   . Pulmonary nodule 06/18/2019   noted on CT Chest   . PVD 10/06/2007  . Unspecified essential hypertension 10/19/2007   Past Surgical History:  Procedure Laterality Date  . ABDOMINAL HYSTERECTOMY    .  BIOPSY  08/06/2019   Procedure: BIOPSY;  Surgeon: Gatha Mayer, MD;  Location: [facility];  Service: Gastroenterology;;  . [name]     bilat  . CARPAL TUNNEL RELEASE    . CHOLECYSTECTOMY  01/29/2016   at [facility]  . CHOLECYSTECTOMY    . ENDOSCOPIC RETROGRADE CHOLANGIOPANCREATOGRAPHY (ERCP) WITH PROPOFOL N/A 12/01/2015   Procedure: ENDOSCOPIC RETROGRADE CHOLANGIOPANCREATOGRAPHY (ERCP) WITH PROPOFOL;  Surgeon: [name], MD;  [facility];  Service: Endoscopy;  Laterality: N/A;  .  ENDOSCOPIC RETROGRADE CHOLANGIOPANCREATOGRAPHY (ERCP) WITH PROPOFOL N/A 02/01/2016   Procedure: ENDOSCOPIC RETROGRADE CHOLANGIOPANCREATOGRAPHY (ERCP) WITH PROPOFOL;  Surgeon: [name], MD;  [facility];  Service: Endoscopy;  Laterality: N/A;  . ENDOSCOPIC RETROGRADE CHOLANGIOPANCREATOGRAPHY (ERCP) WITH PROPOFOL N/A 04/07/2018   Procedure: ENDOSCOPIC RETROGRADE CHOLANGIOPANCREATOGRAPHY (ERCP) WITH PROPOFOL;  Surgeon: [name], MD;  [facility];  Service: Endoscopy;  Laterality: N/A;  . ESOPHAGOGASTRODUODENOSCOPY (EGD) WITH PROPOFOL N/A 08/06/2019   Procedure: ESOPHAGOGASTRODUODENOSCOPY (EGD) WITH PROPOFOL;  Surgeon: Gatha Mayer, MD;  [facility];  Service: Gastroenterology;  Laterality: N/A;  . HAMMER TOE SURGERY    . HEMORRHOID SURGERY    . Hyperplastic colon polyps, removed  2007   By Dr. Penelope Coop  . JOINT REPLACEMENT  2011   right knee  . OOPHORECTOMY    . REMOVAL OF STONES  04/07/2018   Procedure: REMOVAL OF STONES;  Surgeon: [name], MD;  [facility];  Service: Endoscopy;;  . ROTATOR CUFF REPAIR  2004   left (Dr. Durward Fortes)  . SPHINCTEROTOMY  04/07/2018   Procedure: SPHINCTEROTOMY;  Surgeon: [name], MD;  Location: [facility];  Service: Endoscopy;;  . TOTAL HIP ARTHROPLASTY Right 11/26/2016   Procedure: RIGHT TOTAL HIP ARTHROPLASTY;  Surgeon: Garald Balding, MD;  Location: [place];  Service: Orthopedics;  Laterality: Right;  . TOTAL KNEE ARTHROPLASTY Right   . TOTAL KNEE ARTHROPLASTY Left 07/26/2019   Procedure: TOTAL KNEE ARTHROPLASTY;  Surgeon: Gaynelle Arabian, MD;  [facility];  Service: Orthopedics;  Laterality: Left;  36min   Family History  Problem Relation Age of Onset  . Ovarian cancer Mother   . Cancer Mother   . Leukemia Father   . Lung cancer Father   . Cancer Father   . Cancer Brother   . Diabetes Neg Hx   . Heart disease Neg Hx   . Hypertension Neg Hx    Social History   Socioeconomic  History  . Marital status: Divorced    Spouse name: Not on file  . Number of children: 1  . Years of education: 80  . Highest education level: Not on file  Occupational History  . Occupation: Producer, television/film/video: RETIRED    Comment: retired  Tobacco Use  . Smoking status: Never Smoker  . Smokeless tobacco: Never Used  Vaping Use  . Vaping Use: Never used  Substance and Sexual Activity  . Alcohol use: No  . Drug use: No  . Sexual activity: Not Currently    Birth control/protection: Surgical  Other Topics Concern  . Not on file  Social History Narrative   HSG. Married - divorced for many years.  Retired. Lives alone - very active and independent.   Social Determinants of Health   Financial Resource Strain: Low Risk   . Difficulty of Paying Living Expenses: Not hard at all  Food Insecurity: No Food Insecurity  . Worried About Charity fundraiser in the Last  Year: Never true  . Ran Out of Food in the Last Year: Never true  Transportation Needs: No Transportation Needs  . Lack of Transportation (Medical): No  . Lack of Transportation (Non-Medical): No  Physical Activity: Sufficiently Active  . Days of Exercise per Week: 5 days  . Minutes of Exercise per Session: 30 min  Stress: No Stress Concern Present  . Feeling of Stress : Not at all  Social Connections: Moderately Integrated  . Frequency of Communication with Friends and Family: More than three times a week  . Frequency of Social Gatherings with Friends and Family: More than three times a week  . Attends Religious Services: More than 4 times per year  . Active Member of Clubs or Organizations: Yes  . Attends Archivist Meetings: More than 4 times per year  . Marital Status: Divorced    Tobacco Counseling Counseling given: No   Clinical Intake:  Pre-visit preparation completed: Yes  Pain : No/denies pain     BMI - recorded: 27.36 Nutritional Status: BMI 25 -29 Overweight Nutritional Risks:  None Diabetes: No  How often do you need to have someone help you when you read instructions, pamphlets, or other written materials from your doctor or pharmacy?: 1 - Never What is the last grade level you completed in school?: HSG  Diabetic? no  Interpreter Needed?: No  Information entered by :: Lisette Abu, LPN   Activities of Daily Living In your present state of health, do you have any difficulty performing the following activities: 05/31/2020 08/31/2019  Hearing? N Y  Comment - slight hoh  Vision? N N  Difficulty concentrating or making decisions? N Y  Walking or climbing stairs? N Y  Comment - -  Dressing or bathing? N Y  Doing errands, shopping? N N  Preparing Food and eating ? N -  Using the Toilet? N -  In the past six months, have you accidently leaked urine? N -  Do you have problems with loss of bowel control? N -  Managing your Medications? N -  Managing your Finances? N -  Housekeeping or managing your Housekeeping? N -  Some recent data might be hidden    Patient Care Team: Janith Lima, MD as PCP - General (Internal Medicine) Fay Records, MD as PCP - Cardiology (Cardiology) [name], MD (Gastroenterology) Darlin Coco, MD as Consulting Physician (Cardiology) Garald Balding, MD (Orthopedic Surgery) Charlton Haws, [facility] (Pharmacist)  Indicate any recent Medical Services you may have received from other than [facility] providers in the past year (date may be approximate).     Assessment:   This is a routine wellness examination for [place].  Hearing/Vision screen No exam data present  Dietary issues and exercise activities discussed: Current Exercise Habits: Home exercise routine, Type of exercise: walking (yard word and gardening), Time (Minutes): 30, Frequency (Times/Week): 5, Weekly Exercise (Minutes/Week): 150, Intensity: Moderate, Exercise limited by: orthopedic condition(s)  Goals   None    Depression Screen PHQ 2/9  Scores 05/31/2020 06/08/2019 07/09/2018 07/08/2018 02/17/2017 05/16/2015 05/15/2015  PHQ - 2 Score 0 0 0 0 0 0 0    Fall Risk Fall Risk  05/31/2020 06/08/2019 02/01/2019 07/09/2018 07/08/2018  Falls in the past year? 0 0 0 No No  Number falls in past yr: 0 0 0 - -  Injury with Fall? 0 0 0 - -  Risk for fall due to : Orthopedic patient Impaired mobility;Impaired balance/gait - - -  Follow up Falls evaluation completed Falls evaluation completed;Education provided Falls evaluation completed - -    Any stairs in or around the home? Yes  If so, are there any without handrails? No  Home free of loose throw rugs in walkways, pet beds, electrical cords, etc? Yes  Adequate lighting in your home to reduce risk of falls? Yes   ASSISTIVE DEVICES UTILIZED TO PREVENT FALLS:  Life alert? No  Use of a cane, walker or w/c? No  Grab bars in the bathroom? Yes  Shower chair or bench in shower? Yes  Elevated toilet seat or a handicapped toilet? Yes   TIMED UP AND GO:  Was the test performed? No .  Length of time to ambulate 10 feet: 0 sec.   Gait steady and fast without use of assistive device  Cognitive Function:        Immunizations Immunization History  Administered Date(s) Administered  . Fluad Quad(high Dose 65+) 08/04/2019  . Influenza Whole 08/26/2009, 08/25/2012  . Influenza, High Dose Seasonal PF 08/03/2015, 08/01/2016, 07/31/2017, 09/01/2018  . Influenza-Unspecified 07/26/2013, 07/28/2014  . PFIZER SARS-COV-2 Vaccination 02/26/2020, 03/22/2020  . Pneumococcal Conjugate-13 02/01/2014  . Pneumococcal Polysaccharide-23 05/15/2015  . Td 07/16/2012  . Zoster 01/23/2013  . Zoster Recombinat (Shingrix) 04/19/2020    TDAP status: Up to date Flu Vaccine status: Up to date Pneumococcal vaccine status: Up to date Covid-19 vaccine status: Completed vaccines  Qualifies for Shingles Vaccine? Yes   Zostavax completed Yes   Shingrix Completed?: Yes  Screening Tests Health Maintenance  Topic  Date Due  . INFLUENZA VACCINE  06/25/2020  . TETANUS/TDAP  07/16/2022  . DEXA SCAN  Completed  . COVID-19 Vaccine  Completed  . PNA vac Low Risk Adult  Completed    Health Maintenance  There are no preventive care reminders to display for this patient.  Colorectal cancer screening: No longer required.  Mammogram status: No longer required.  Bone Density status: Completed 04/28/2014. Results reflect: Bone density results: NORMAL. Repeat every 5-10 years.  Lung Cancer Screening: (Low Dose CT Chest recommended if Age 65-80 years, 30 pack-year currently smoking OR have quit w/in 15years.) does not qualify.   Lung Cancer Screening Referral: no  Additional Screening:  Hepatitis C Screening: does not qualify; Completed no  Vision Screening: Recommended annual ophthalmology exams for early detection of glaucoma and other disorders of the eye. Is the patient up to date with their annual eye exam?  Yes  Who is the provider or what is the name of the office in which the patient attends annual eye exams? [facility] If pt is not established with a provider, would they like to be referred to a provider to establish care? No .   Dental Screening: Recommended annual dental exams for proper oral hygiene  Community Resource Referral / Chronic Care Management: CRR required this visit?  No   CCM required this visit?  No      Plan:     I have personally reviewed and noted the following in the patient's chart:   . Medical and social history . Use of alcohol, tobacco or illicit drugs  . Current medications and supplements . Functional ability and status . Nutritional status . Physical activity . Advanced directives . List of other physicians . Hospitalizations, surgeries, and ER visits in previous 12 months . Vitals . Screenings to include cognitive, depression, and falls . Referrals and appointments  In addition, I have reviewed and discussed with patient certain preventive  protocols, quality metrics, and best practice recommendations. A written personalized care plan for preventive services as well as general preventive health recommendations were provided to patient.     Sheral Flow, LPN   05/02/2573   Nurse Notes:  Patient is cogitatively intact. There were no vitals filed for this visit. Patient refused. There is height and weight on file to calculate BMI.

## 2020-06-01 ENCOUNTER — Ambulatory Visit: Payer: Medicare Other | Admitting: Orthopaedic Surgery

## 2020-06-01 ENCOUNTER — Encounter: Payer: Self-pay | Admitting: Orthopaedic Surgery

## 2020-06-01 VITALS — Ht 65.0 in | Wt 164.0 lb

## 2020-06-01 DIAGNOSIS — G5602 Carpal tunnel syndrome, left upper limb: Secondary | ICD-10-CM | POA: Diagnosis not present

## 2020-06-01 NOTE — Progress Notes (Deleted)
Office Visit Note   Patient: Claudia Lara           Date of Birth: 11-06-1932           MRN: [id number] Visit Date: 06/01/2020              Requested by: Janith Lima, MD [address] [place],  [place] [address] PCP: Janith Lima, MD   Assessment & Plan: Visit Diagnoses: No diagnosis found.  Plan: ***  Follow-Up Instructions: No follow-ups on file.   Orders:  No orders of the defined types were placed in this encounter.  No orders of the defined types were placed in this encounter.     Procedures: No procedures performed   Clinical Data: No additional findings.   Subjective: Chief Complaint  Patient presents with  . Left Hand - Follow-up    EMG results  Patient presents today for follow up on her left wrist. She saw Dr.Newton on 05-26-2020 and had an EMG study. She is here today for those results. No changes since her last visit.  HPI  Review of Systems  Constitutional: Negative for fatigue.  HENT: Negative for ear pain.   Eyes: Negative for pain.  Respiratory: Negative for shortness of breath.   Cardiovascular: Negative for leg swelling.  Gastrointestinal: Negative for constipation and diarrhea.  Endocrine: Negative for cold intolerance and heat intolerance.  Genitourinary: Negative for difficulty urinating.  Musculoskeletal: Positive for joint swelling.  Skin: Negative for rash.  Allergic/Immunologic: Negative for food allergies.  Neurological: Negative for weakness.  Psychiatric/Behavioral: Negative for sleep disturbance.     Objective: Vital Signs: There were no vitals taken for this visit.  Physical Exam  Ortho Exam  Specialty Comments:  No specialty comments available.  Imaging: No results found.   PMFS History: Patient Active Problem List   Diagnosis Date Noted  . Carpal tunnel syndrome, left upper limb 04/05/2020  . Elevated LFTs 12/30/2019  . Iron deficiency anemia due to chronic blood loss 09/08/2019  . Interstitial  pulmonary disease ([place]) 09/08/2019  . Lower leg DVT (deep venous thromboembolism), acute, left ([place]) 08/18/2019  . OA (osteoarthritis) of knee 07/26/2019  . Lung nodule 06/24/2019  . DDD (degenerative disc disease), cervical 04/16/2019  . Zinc deficiency 10/03/2017  . Spondylosis, lumbar, with myelopathy 04/09/2017  . GERD (gastroesophageal reflux disease) 08/03/2015  . Hemolytic anemia ([place]) 07/12/2015  . Chronic venous insufficiency 08/10/2014  . Aortic stenosis, mild 06/29/2014  . Left ventricular diastolic dysfunction, NYHA class 1 06/29/2014  . Senile osteopenia 04/21/2014  . Hyperlipidemia with target LDL less than 130 04/21/2014  . Kidney disease, chronic, stage III (GFR 30-59 ml/min) ([place]) 04/21/2014  . Routine health maintenance 07/16/2012  . Carotid artery stenosis without cerebral infarction 01/16/2010  . Essential hypertension 10/19/2007   Past Medical History:  Diagnosis Date  . ANEMIA 08/22/2009  . Aortic stenosis   . AR (aortic regurgitation) 01/2019   Mild to Moderate, Noted on ECHO  . CAROTID ARTERY STENOSIS 01/16/2010  . Complication of anesthesia    very hard to wake up from surgery  . DYSPNEA ON EXERTION 11/16/2007  . Gallstones   . GEN OSTEOARTHROSIS INVOLVING MULTIPLE SITES 11/11/2008  . GERD (gastroesophageal reflux disease)   . Grade I diastolic dysfunction 44/9675   Noted on ECHO  . Heart murmur   . HEMORRHOIDS, INTERNAL    surgery was in the 90's (per patient)  . History of blood transfusion    after hip and  knee replacements  . NECK PAIN, ACUTE 12/28/2009  . PERIPHERAL EDEMA 10/06/2007  . PONV (postoperative nausea and vomiting)   . Pulmonary nodule 06/18/2019   noted on CT Chest   . PVD 10/06/2007  . Unspecified essential hypertension 10/19/2007    Family History  Problem Relation Age of Onset  . Ovarian cancer Mother   . Cancer Mother   . Leukemia Father   . Lung cancer Father   . Cancer Father   . Cancer Brother   . Diabetes Neg Hx   .  Heart disease Neg Hx   . Hypertension Neg Hx     Past Surgical History:  Procedure Laterality Date  . ABDOMINAL HYSTERECTOMY    . BIOPSY  08/06/2019   Procedure: BIOPSY;  Surgeon: Gatha Mayer, MD;  Location: [facility];  Service: Gastroenterology;;  . [name]     bilat  . CARPAL TUNNEL RELEASE    . CHOLECYSTECTOMY  01/29/2016   at [facility]  . CHOLECYSTECTOMY    . ENDOSCOPIC RETROGRADE CHOLANGIOPANCREATOGRAPHY (ERCP) WITH PROPOFOL N/A 12/01/2015   Procedure: ENDOSCOPIC RETROGRADE CHOLANGIOPANCREATOGRAPHY (ERCP) WITH PROPOFOL;  Surgeon: [name], MD;  [facility];  Service: Endoscopy;  Laterality: N/A;  . ENDOSCOPIC RETROGRADE CHOLANGIOPANCREATOGRAPHY (ERCP) WITH PROPOFOL N/A 02/01/2016   Procedure: ENDOSCOPIC RETROGRADE CHOLANGIOPANCREATOGRAPHY (ERCP) WITH PROPOFOL;  Surgeon: [name], MD;  [facility];  Service: Endoscopy;  Laterality: N/A;  . ENDOSCOPIC RETROGRADE CHOLANGIOPANCREATOGRAPHY (ERCP) WITH PROPOFOL N/A 04/07/2018   Procedure: ENDOSCOPIC RETROGRADE CHOLANGIOPANCREATOGRAPHY (ERCP) WITH PROPOFOL;  Surgeon: [name], MD;  [facility];  Service: Endoscopy;  Laterality: N/A;  . ESOPHAGOGASTRODUODENOSCOPY (EGD) WITH PROPOFOL N/A 08/06/2019   Procedure: ESOPHAGOGASTRODUODENOSCOPY (EGD) WITH PROPOFOL;  Surgeon: Gatha Mayer, MD;  [facility];  Service: Gastroenterology;  Laterality: N/A;  . HAMMER TOE SURGERY    . HEMORRHOID SURGERY    . Hyperplastic colon polyps, removed  2007   By Dr. Penelope Coop  . JOINT REPLACEMENT  2011   right knee  . OOPHORECTOMY    . REMOVAL OF STONES  04/07/2018   Procedure: REMOVAL OF STONES;  Surgeon: [name], MD;  [facility];  Service: Endoscopy;;  . ROTATOR CUFF REPAIR  2004   left (Dr. Durward Fortes)  . SPHINCTEROTOMY  04/07/2018   Procedure: SPHINCTEROTOMY;  Surgeon: [name], MD;  Location: [facility];  Service: Endoscopy;;  . TOTAL HIP  ARTHROPLASTY Right 11/26/2016   Procedure: RIGHT TOTAL HIP ARTHROPLASTY;  Surgeon: Garald Balding, MD;  Location: [place];  Service: Orthopedics;  Laterality: Right;  . TOTAL KNEE ARTHROPLASTY Right   . TOTAL KNEE ARTHROPLASTY Left 07/26/2019   Procedure: TOTAL KNEE ARTHROPLASTY;  Surgeon: Gaynelle Arabian, MD;  [facility];  Service: Orthopedics;  Laterality: Left;  14min   Social History   Occupational History  . Occupation: Producer, television/film/video: RETIRED    Comment: retired  Tobacco Use  . Smoking status: Never Smoker  . Smokeless tobacco: Never Used  Vaping Use  . Vaping Use: Never used  Substance and Sexual Activity  . Alcohol use: No  . Drug use: No  . Sexual activity: Not Currently    Birth control/protection: Surgical

## 2020-06-01 NOTE — Progress Notes (Signed)
Office Visit Note   Patient: Claudia Lara           Date of Birth: Sep 10, 1932           MRN: [id number] Visit Date: 06/01/2020              Requested by: Janith Lima, MD [address] [place],  [place] [address] PCP: Janith Lima, MD   Assessment & Plan: Visit Diagnoses:  1. Carpal tunnel syndrome, left upper limb     Plan:  #1: At this time our plan is to perform a left carpal tunnel release with median nerve neurolysis. #2: She will schedule this with therapy in the near future.  Follow-Up Instructions: No follow-ups on file.   Orders:  No orders of the defined types were placed in this encounter.  No orders of the defined types were placed in this encounter.     Procedures: No procedures performed   Clinical Data: No additional findings.   Subjective: Chief Complaint  Patient presents with  . Left Hand - Follow-up    EMG results    HPI  Claudia Lara is a very pleasant 86 year old white female who returns today for follow-up of her left hand numbness.  She was last seen in May of this year with a several month history of pain and numbness in her left hand.  She is also noted weakness with her hand.  At that time EMGs were ordered she returns today for review of her EMGs of the left hand.  Review of Systems Constitutional: Positive for fatigue.  HENT: Negative for ear pain.   Eyes: Negative for pain.  Respiratory: Negative for shortness of breath.   Cardiovascular: Negative for leg swelling.  Gastrointestinal: Negative for constipation and diarrhea.  Endocrine: Negative for cold intolerance and heat intolerance.  Genitourinary: Negative for difficulty urinating.  Musculoskeletal: Positive for joint swelling.  Skin: Negative for rash.  Allergic/Immunologic: Negative for food allergies.  Neurological: Negative for weakness.  Hematological: Does not bruise/bleed easily.  Psychiatric/Behavioral: Negative for sleep disturbance.      Objective: Vital Signs: Ht 5\' 5"  (1.651 m)   Wt 164 lb (74.4 kg)   BMI 27.29 kg/m   Physical Exam Constitutional:      Appearance: Normal appearance. She is well-developed. She is obese.  HENT:     Head: Normocephalic.  Eyes:     Pupils: Pupils are equal, round, and reactive to light.  Pulmonary:     Effort: Pulmonary effort is normal.  Skin:    General: Skin is warm and dry.  Neurological:     General: No focal deficit present.     Mental Status: She is alert and oriented to person, place, and time.  Psychiatric:        Behavior: Behavior normal.     Ortho Exam  Exam today reveals a positive Tinel's sign over the carpal tunnel.  She does have some weakness with opposition of the thumb and index finger.  Decreased sensation in the volar aspect of the thumb index and long finger.  Specialty Comments:  No specialty comments available.  Imaging: No results found.   PMFS History: Current Outpatient Medications  Medication Sig Dispense Refill  . Calcium Carb-Cholecalciferol (CALCIUM CARBONATE-VITAMIN D3 PO) Take 1 tablet by mouth daily.     . carvedilol (COREG) 12.5 MG tablet Take 1 tablet (12.5 mg total) by mouth 2 (two) times daily with a meal. 180 tablet 1  . cholecalciferol (VITAMIN D)  1000 units tablet Take 1,000 Units by mouth daily.    . Multiple Vitamin (MULTIVITAMIN WITH MINERALS) TABS tablet Take 1 tablet by mouth daily.    . pantoprazole (PROTONIX) 40 MG tablet TAKE 1 TABLET BY MOUTH EVERY DAY 90 tablet 1  . torsemide (DEMADEX) 20 MG tablet TAKE 1 TABLET BY MOUTH EVERY DAY 90 tablet 1  . traMADol (ULTRAM) 50 MG tablet Take 1-2 tablets (50-100 mg total) by mouth every 12 (twelve) hours as needed for moderate pain. 56 tablet 0  . vitamin C (ASCORBIC ACID) 500 MG tablet Take 500 mg by mouth daily.     No current facility-administered medications for this visit.   : We will schedule days Patient Active Problem List   Diagnosis Date Noted  . Carpal  tunnel syndrome, left upper limb 04/05/2020  . Elevated LFTs 12/30/2019  . Iron deficiency anemia due to chronic blood loss 09/08/2019  . Interstitial pulmonary disease ([place]) 09/08/2019  . Lower leg DVT (deep venous thromboembolism), acute, left ([place]) 08/18/2019  . OA (osteoarthritis) of knee 07/26/2019  . Lung nodule 06/24/2019  . DDD (degenerative disc disease), cervical 04/16/2019  . Zinc deficiency 10/03/2017  . Spondylosis, lumbar, with myelopathy 04/09/2017  . GERD (gastroesophageal reflux disease) 08/03/2015  . Hemolytic anemia ([place]) 07/12/2015  . Chronic venous insufficiency 08/10/2014  . Aortic stenosis, mild 06/29/2014  . Left ventricular diastolic dysfunction, NYHA class 1 06/29/2014  . Senile osteopenia 04/21/2014  . Hyperlipidemia with target LDL less than 130 04/21/2014  . Kidney disease, chronic, stage III (GFR 30-59 ml/min) ([place]) 04/21/2014  . Routine health maintenance 07/16/2012  . Carotid artery stenosis without cerebral infarction 01/16/2010  . Essential hypertension 10/19/2007   Past Medical History:  Diagnosis Date  . ANEMIA 08/22/2009  . Aortic stenosis   . AR (aortic regurgitation) 01/2019   Mild to Moderate, Noted on ECHO  . CAROTID ARTERY STENOSIS 01/16/2010  . Complication of anesthesia    very hard to wake up from surgery  . DYSPNEA ON EXERTION 11/16/2007  . Gallstones   . GEN OSTEOARTHROSIS INVOLVING MULTIPLE SITES 11/11/2008  . GERD (gastroesophageal reflux disease)   . Grade I diastolic dysfunction 40/9811   Noted on ECHO  . Heart murmur   . HEMORRHOIDS, INTERNAL    surgery was in the 90's (per patient)  . History of blood transfusion    after hip and knee replacements  . NECK PAIN, ACUTE 12/28/2009  . PERIPHERAL EDEMA 10/06/2007  . PONV (postoperative nausea and vomiting)   . Pulmonary nodule 06/18/2019   noted on CT Chest   . PVD 10/06/2007  . Unspecified essential hypertension 10/19/2007    Family History  Problem Relation Age of Onset   . Ovarian cancer Mother   . Cancer Mother   . Leukemia Father   . Lung cancer Father   . Cancer Father   . Cancer Brother   . Diabetes Neg Hx   . Heart disease Neg Hx   . Hypertension Neg Hx     Past Surgical History:  Procedure Laterality Date  . ABDOMINAL HYSTERECTOMY    . BIOPSY  08/06/2019   Procedure: BIOPSY;  Surgeon: Gatha Mayer, MD;  Location: [facility];  Service: Gastroenterology;;  . [name]     bilat  . CARPAL TUNNEL RELEASE    . CHOLECYSTECTOMY  01/29/2016   at [facility]  . CHOLECYSTECTOMY    . ENDOSCOPIC RETROGRADE CHOLANGIOPANCREATOGRAPHY (ERCP) WITH PROPOFOL N/A 12/01/2015   Procedure: ENDOSCOPIC RETROGRADE  CHOLANGIOPANCREATOGRAPHY (ERCP) WITH PROPOFOL;  Surgeon: [name], MD;  Location: [facility];  Service: Endoscopy;  Laterality: N/A;  . ENDOSCOPIC RETROGRADE CHOLANGIOPANCREATOGRAPHY (ERCP) WITH PROPOFOL N/A 02/01/2016   Procedure: ENDOSCOPIC RETROGRADE CHOLANGIOPANCREATOGRAPHY (ERCP) WITH PROPOFOL;  Surgeon: [name], MD;  [facility]: [facility];  Service: Endoscopy;  Laterality: N/A;  . ENDOSCOPIC RETROGRADE CHOLANGIOPANCREATOGRAPHY (ERCP) WITH PROPOFOL N/A 04/07/2018   Procedure: ENDOSCOPIC RETROGRADE CHOLANGIOPANCREATOGRAPHY (ERCP) WITH PROPOFOL;  Surgeon: [name], MD;  [facility];  Service: Endoscopy;  Laterality: N/A;  . ESOPHAGOGASTRODUODENOSCOPY (EGD) WITH PROPOFOL N/A 08/06/2019   Procedure: ESOPHAGOGASTRODUODENOSCOPY (EGD) WITH PROPOFOL;  Surgeon: Gatha Mayer, MD;  [facility];  Service: Gastroenterology;  Laterality: N/A;  . HAMMER TOE SURGERY    . HEMORRHOID SURGERY    . Hyperplastic colon polyps, removed  2007   By Dr. Penelope Coop  . JOINT REPLACEMENT  2011   right knee  . OOPHORECTOMY    . REMOVAL OF STONES  04/07/2018   Procedure: REMOVAL OF STONES;  Surgeon: [name], MD;  [facility]: [facility];  Service: Endoscopy;;  . ROTATOR CUFF REPAIR  2004   left (Dr. Durward Fortes)   . SPHINCTEROTOMY  04/07/2018   Procedure: SPHINCTEROTOMY;  Surgeon: [name], MD;  Location: [facility];  Service: Endoscopy;;  . TOTAL HIP ARTHROPLASTY Right 11/26/2016   Procedure: RIGHT TOTAL HIP ARTHROPLASTY;  Surgeon: Garald Balding, MD;  Location: [facility];  Service: Orthopedics;  Laterality: Right;  . TOTAL KNEE ARTHROPLASTY Right   . TOTAL KNEE ARTHROPLASTY Left 07/26/2019   Procedure: TOTAL KNEE ARTHROPLASTY;  Surgeon: Gaynelle Arabian, MD;  Location: [facility];  Service: Orthopedics;  Laterality: Left;  97min   Social History   Occupational History  . Occupation: Producer, television/film/video: RETIRED    Comment: retired  Tobacco Use  . Smoking status: Never Smoker  . Smokeless tobacco: Never Used  Vaping Use  . Vaping Use: Never used  Substance and Sexual Activity  . Alcohol use: No  . Drug use: No  . Sexual activity: Not Currently    Birth control/protection: Surgical

## 2020-06-22 DIAGNOSIS — G5602 Carpal tunnel syndrome, left upper limb: Secondary | ICD-10-CM | POA: Diagnosis not present

## 2020-06-29 ENCOUNTER — Other Ambulatory Visit: Payer: Self-pay

## 2020-06-29 ENCOUNTER — Ambulatory Visit: Payer: Medicare Other | Admitting: Orthopaedic Surgery

## 2020-06-29 ENCOUNTER — Encounter: Payer: Self-pay | Admitting: Orthopaedic Surgery

## 2020-06-29 ENCOUNTER — Telehealth: Payer: Self-pay | Admitting: Orthopaedic Surgery

## 2020-06-29 VITALS — Ht 65.0 in | Wt 164.0 lb

## 2020-06-29 DIAGNOSIS — G5602 Carpal tunnel syndrome, left upper limb: Secondary | ICD-10-CM

## 2020-06-29 NOTE — Telephone Encounter (Signed)
Patient had appt today. Dr. Durward Fortes stated patient needs to be seen in 1 week. Please contact patient with 1 week appt. Patient phone number is [phone].

## 2020-06-29 NOTE — Progress Notes (Signed)
Office Visit Note   Patient: Claudia Lara           Date of Birth: [birth year]           MRN: [id number] Visit Date: 06/29/2020              Requested by: Claudia Lima, MD [address] [place],  [place] [address] PCP: Claudia Lima, MD   Assessment & Plan: Visit Diagnoses:  1. Carpal tunnel syndrome, left upper limb     Plan: #1: Every other stitch was removed.  The wound is very well healing. #2: Follow back up 1 week for removal of the remaining sutures.  Follow-Up Instructions: Return in about 1 week (around 07/06/2020).   Orders:  No orders of the defined types were placed in this encounter.  No orders of the defined types were placed in this encounter.     Procedures: No procedures performed   Clinical Data: No additional findings.   Subjective: Chief Complaint  Patient presents with  . Left Wrist - Follow-up    Left carpal tunnel release 06/22/2020  Patient presents today for follow up on her left wrist. She had left carpal tunnel release on 06/22/2020. She is now 1 week out from surgery.Patient states that she is doing well. No complaints. She is not needing to take anything for pain.   HPI  Claudia Lara is doing very well at this time with regards of her left carpal tunnel release.  She still has some numbness present she states that that happened with her other hand and slowly improved.  She has a tremendous amount of arthritis in her fingers.   Review of Systems   Objective: Vital Signs: Ht 5\' 5"  (1.651 m)   Wt 164 lb (74.4 kg)   BMI 27.29 kg/m   Physical Exam  Ortho Exam Exam today reveals the wound to be healing per primam with no signs of infection.  Excellent apposition of the skin edges.  Can barely see the incision at this time.  Sensation is intact but decreased but this is consistent with her severity of the carpal tunnel compression.  Is got good capillary refill.  She is able to make a fist and straighten her fingers.   Specialty  Comments:  No specialty comments available.  Imaging: No results found.   PMFS History: Current Outpatient Medications  Medication Sig Dispense Refill  . Calcium Carb-Cholecalciferol (CALCIUM CARBONATE-VITAMIN D3 PO) Take 1 tablet by mouth daily.     . carvedilol (COREG) 12.5 MG tablet Take 1 tablet (12.5 mg total) by mouth 2 (two) times daily with a meal. 180 tablet 1  . cholecalciferol (VITAMIN D) 1000 units tablet Take 1,000 Units by mouth daily.    . Multiple Vitamin (MULTIVITAMIN WITH MINERALS) TABS tablet Take 1 tablet by mouth daily.    . pantoprazole (PROTONIX) 40 MG tablet TAKE 1 TABLET BY MOUTH EVERY DAY 90 tablet 1  . torsemide (DEMADEX) 20 MG tablet TAKE 1 TABLET BY MOUTH EVERY DAY 90 tablet 1  . traMADol (ULTRAM) 50 MG tablet Take 1-2 tablets (50-100 mg total) by mouth every 12 (twelve) hours as needed for moderate pain. 56 tablet 0  . vitamin C (ASCORBIC ACID) 500 MG tablet Take 500 mg by mouth daily.     No current facility-administered medications for this visit.    Patient Active Problem List   Diagnosis Date Noted  . Carpal tunnel syndrome, left upper limb 04/05/2020  . Elevated LFTs  12/30/2019  . Iron deficiency anemia due to chronic blood loss 09/08/2019  . Interstitial pulmonary disease ([place]) 09/08/2019  . Lower leg DVT (deep venous thromboembolism), acute, left ([place]) 08/18/2019  . OA (osteoarthritis) of knee 07/26/2019  . Lung nodule 06/24/2019  . DDD (degenerative disc disease), cervical 04/16/2019  . Zinc deficiency 10/03/2017  . Spondylosis, lumbar, with myelopathy 04/09/2017  . GERD (gastroesophageal reflux disease) 08/03/2015  . Hemolytic anemia ([place]) 07/12/2015  . Chronic venous insufficiency 08/10/2014  . Aortic stenosis, mild 06/29/2014  . Left ventricular diastolic dysfunction, NYHA class 1 06/29/2014  . Senile osteopenia 04/21/2014  . Hyperlipidemia with target LDL less than 130 04/21/2014  . Kidney disease, chronic, stage III (GFR 30-59  ml/min) ([place]) 04/21/2014  . Routine health maintenance 07/16/2012  . Carotid artery stenosis without cerebral infarction 01/16/2010  . Essential hypertension 10/19/2007   Past Medical History:  Diagnosis Date  . ANEMIA 08/22/2009  . Aortic stenosis   . AR (aortic regurgitation) 01/2019   Mild to Moderate, Noted on ECHO  . CAROTID ARTERY STENOSIS 01/16/2010  . Complication of anesthesia    very hard to wake up from surgery  . DYSPNEA ON EXERTION 11/16/2007  . Gallstones   . GEN OSTEOARTHROSIS INVOLVING MULTIPLE SITES 11/11/2008  . GERD (gastroesophageal reflux disease)   . Grade I diastolic dysfunction 69/6295   Noted on ECHO  . Heart murmur   . HEMORRHOIDS, INTERNAL    surgery was in the 90's (per patient)  . History of blood transfusion    after hip and knee replacements  . NECK PAIN, ACUTE 12/28/2009  . PERIPHERAL EDEMA 10/06/2007  . PONV (postoperative nausea and vomiting)   . Pulmonary nodule 06/18/2019   noted on CT Chest   . PVD 10/06/2007  . Unspecified essential hypertension 10/19/2007    Family History  Problem Relation Age of Onset  . Ovarian cancer Mother   . Cancer Mother   . Leukemia Father   . Lung cancer Father   . Cancer Father   . Cancer Brother   . Diabetes Neg Hx   . Heart disease Neg Hx   . Hypertension Neg Hx     Past Surgical History:  Procedure Laterality Date  . ABDOMINAL HYSTERECTOMY    . BIOPSY  08/06/2019   Procedure: BIOPSY;  Surgeon: Gatha Mayer, MD;  Location: [facility];  Service: Gastroenterology;;  . [name]     bilat  . CARPAL TUNNEL RELEASE    . CHOLECYSTECTOMY  01/29/2016   at [facility]  . CHOLECYSTECTOMY    . ENDOSCOPIC RETROGRADE CHOLANGIOPANCREATOGRAPHY (ERCP) WITH PROPOFOL N/A 12/01/2015   Procedure: ENDOSCOPIC RETROGRADE CHOLANGIOPANCREATOGRAPHY (ERCP) WITH PROPOFOL;  Surgeon: [name], MD;  [facility];  Service: Endoscopy;  Laterality: N/A;  . ENDOSCOPIC RETROGRADE  CHOLANGIOPANCREATOGRAPHY (ERCP) WITH PROPOFOL N/A 02/01/2016   Procedure: ENDOSCOPIC RETROGRADE CHOLANGIOPANCREATOGRAPHY (ERCP) WITH PROPOFOL;  Surgeon: [name], MD;  [facility];  Service: Endoscopy;  Laterality: N/A;  . ENDOSCOPIC RETROGRADE CHOLANGIOPANCREATOGRAPHY (ERCP) WITH PROPOFOL N/A 04/07/2018   Procedure: ENDOSCOPIC RETROGRADE CHOLANGIOPANCREATOGRAPHY (ERCP) WITH PROPOFOL;  Surgeon: [name], MD;  [facility]: [facility];  Service: Endoscopy;  Laterality: N/A;  . ESOPHAGOGASTRODUODENOSCOPY (EGD) WITH PROPOFOL N/A 08/06/2019   Procedure: ESOPHAGOGASTRODUODENOSCOPY (EGD) WITH PROPOFOL;  Surgeon: Gatha Mayer, MD;  [facility]: [facility];  Service: Gastroenterology;  Laterality: N/A;  . HAMMER TOE SURGERY    . HEMORRHOID SURGERY    . Hyperplastic colon polyps, removed  2007  By Dr. Penelope Coop  . JOINT REPLACEMENT  2011   right knee  . OOPHORECTOMY    . REMOVAL OF STONES  04/07/2018   Procedure: REMOVAL OF STONES;  Surgeon: [name], MD;  [facility];  Service: Endoscopy;;  . ROTATOR CUFF REPAIR  2004   left (Dr. Durward Fortes)  . SPHINCTEROTOMY  04/07/2018   Procedure: SPHINCTEROTOMY;  Surgeon: [name], MD;  Location: [facility];  Service: Endoscopy;;  . TOTAL HIP ARTHROPLASTY Right 11/26/2016   Procedure: RIGHT TOTAL HIP ARTHROPLASTY;  Surgeon: Garald Balding, MD;  Location: [facility];  Service: Orthopedics;  Laterality: Right;  . TOTAL KNEE ARTHROPLASTY Right   . TOTAL KNEE ARTHROPLASTY Left 07/26/2019   Procedure: TOTAL KNEE ARTHROPLASTY;  Surgeon: Gaynelle Arabian, MD;  Location: [facility];  Service: Orthopedics;  Laterality: Left;  15min   Social History   Occupational History  . Occupation: Producer, television/film/video: RETIRED    Comment: retired  Tobacco Use  . Smoking status: Never Smoker  . Smokeless tobacco: Never Used  Vaping Use  . Vaping Use: Never used  Substance and Sexual Activity  . Alcohol use: No  . Drug use: No  . Sexual  activity: Not Currently    Birth control/protection: Surgical

## 2020-06-29 NOTE — Telephone Encounter (Signed)
Called patient. No answer. Left message with an appointment time for next week. Ask her to call back if that particular time did not work for her.

## 2020-07-04 DIAGNOSIS — L720 Epidermal cyst: Secondary | ICD-10-CM | POA: Diagnosis not present

## 2020-07-04 DIAGNOSIS — L570 Actinic keratosis: Secondary | ICD-10-CM | POA: Diagnosis not present

## 2020-07-04 DIAGNOSIS — X32XXXD Exposure to sunlight, subsequent encounter: Secondary | ICD-10-CM | POA: Diagnosis not present

## 2020-07-06 ENCOUNTER — Ambulatory Visit: Payer: Medicare Other | Admitting: Orthopaedic Surgery

## 2020-07-06 ENCOUNTER — Encounter: Payer: Self-pay | Admitting: Orthopaedic Surgery

## 2020-07-06 ENCOUNTER — Other Ambulatory Visit: Payer: Self-pay

## 2020-07-06 VITALS — Ht 65.0 in | Wt 164.0 lb

## 2020-07-06 DIAGNOSIS — G5602 Carpal tunnel syndrome, left upper limb: Secondary | ICD-10-CM

## 2020-07-06 NOTE — Progress Notes (Signed)
Office Visit Note   Patient: Claudia Lara           Date of Birth: 06-22-1932           MRN: [id number] Visit Date: 07/06/2020              Requested by: Janith Lima, MD [address] [place],  [place] [address] PCP: Janith Lima, MD   Assessment & Plan: Visit Diagnoses:  1. Carpal tunnel syndrome, left upper limb     Plan:  #1: At this time the remaining sutures were removed and Steri-Strips were placed.  She has excellent approximation of the wound.  No opening.    Follow-Up Instructions: Return in about 3 weeks (around 07/27/2020).   Orders:  No orders of the defined types were placed in this encounter.  No orders of the defined types were placed in this encounter.     Procedures: No procedures performed   Clinical Data: No additional findings.   Subjective: Chief Complaint  Patient presents with  . Left Wrist - Follow-up    Left carpal tunnel release 06/22/2020   HPI Patient presents today for follow up on her left wrist. She had carpal tunnel release on 06/22/2020 and is now 2 weeks out from surgery. She states that she has been doing really well, but woke up sore in her wrist this morning. She has been wearing her wrist brace. She is not taking anything for pain.  Still has a little bit of numbness in the hand but that certainly is better than it was prior to surgery.   Review of Systems   Objective: Vital Signs: Ht 5\' 5"  (1.651 m)   Wt 164 lb (74.4 kg)   BMI 27.29 kg/m   Physical Exam  Ortho Exam  Her wound today is healing per primam with no signs of infection.  Sensation is intact to light touch but slightly decreased.  She has a fair amount of arthritis in her hands and cannot make a fist on either hand.  Cap refill is less than 2 seconds.  Specialty Comments:  No specialty comments available.  Imaging: No results found.   PMFS History: Current Outpatient Medications  Medication Sig Dispense Refill  . Calcium  Carb-Cholecalciferol (CALCIUM CARBONATE-VITAMIN D3 PO) Take 1 tablet by mouth daily.     . carvedilol (COREG) 12.5 MG tablet Take 1 tablet (12.5 mg total) by mouth 2 (two) times daily with a meal. 180 tablet 1  . cholecalciferol (VITAMIN D) 1000 units tablet Take 1,000 Units by mouth daily.    . Multiple Vitamin (MULTIVITAMIN WITH MINERALS) TABS tablet Take 1 tablet by mouth daily.    . pantoprazole (PROTONIX) 40 MG tablet TAKE 1 TABLET BY MOUTH EVERY DAY 90 tablet 1  . torsemide (DEMADEX) 20 MG tablet TAKE 1 TABLET BY MOUTH EVERY DAY 90 tablet 1  . traMADol (ULTRAM) 50 MG tablet Take 1-2 tablets (50-100 mg total) by mouth every 12 (twelve) hours as needed for moderate pain. 56 tablet 0  . vitamin C (ASCORBIC ACID) 500 MG tablet Take 500 mg by mouth daily.     No current facility-administered medications for this visit.    Patient Active Problem List   Diagnosis Date Noted  . Carpal tunnel syndrome, left upper limb 04/05/2020  . Elevated LFTs 12/30/2019  . Iron deficiency anemia due to chronic blood loss 09/08/2019  . Interstitial pulmonary disease ([place]) 09/08/2019  . Lower leg DVT (deep venous thromboembolism), acute,  left ([place]) 08/18/2019  . OA (osteoarthritis) of knee 07/26/2019  . Lung nodule 06/24/2019  . DDD (degenerative disc disease), cervical 04/16/2019  . Zinc deficiency 10/03/2017  . Spondylosis, lumbar, with myelopathy 04/09/2017  . GERD (gastroesophageal reflux disease) 08/03/2015  . Hemolytic anemia ([place]) 07/12/2015  . Chronic venous insufficiency 08/10/2014  . Aortic stenosis, mild 06/29/2014  . Left ventricular diastolic dysfunction, NYHA class 1 06/29/2014  . Senile osteopenia 04/21/2014  . Hyperlipidemia with target LDL less than 130 04/21/2014  . Kidney disease, chronic, stage III (GFR 30-59 ml/min) ([place]) 04/21/2014  . Routine health maintenance 07/16/2012  . Carotid artery stenosis without cerebral infarction 01/16/2010  . Essential hypertension 10/19/2007    Past Medical History:  Diagnosis Date  . ANEMIA 08/22/2009  . Aortic stenosis   . AR (aortic regurgitation) 01/2019   Mild to Moderate, Noted on ECHO  . CAROTID ARTERY STENOSIS 01/16/2010  . Complication of anesthesia    very hard to wake up from surgery  . DYSPNEA ON EXERTION 11/16/2007  . Gallstones   . GEN OSTEOARTHROSIS INVOLVING MULTIPLE SITES 11/11/2008  . GERD (gastroesophageal reflux disease)   . Grade I diastolic dysfunction 07/3266   Noted on ECHO  . Heart murmur   . HEMORRHOIDS, INTERNAL    surgery was in the 90's (per patient)  . History of blood transfusion    after hip and knee replacements  . NECK PAIN, ACUTE 12/28/2009  . PERIPHERAL EDEMA 10/06/2007  . PONV (postoperative nausea and vomiting)   . Pulmonary nodule 06/18/2019   noted on CT Chest   . PVD 10/06/2007  . Unspecified essential hypertension 10/19/2007    Family History  Problem Relation Age of Onset  . Ovarian cancer Mother   . Cancer Mother   . Leukemia Father   . Lung cancer Father   . Cancer Father   . Cancer Brother   . Diabetes Neg Hx   . Heart disease Neg Hx   . Hypertension Neg Hx     Past Surgical History:  Procedure Laterality Date  . ABDOMINAL HYSTERECTOMY    . BIOPSY  08/06/2019   Procedure: BIOPSY;  Surgeon: Gatha Mayer, MD;  Location: [facility];  Service: Gastroenterology;;  . [name]     bilat  . CARPAL TUNNEL RELEASE    . CHOLECYSTECTOMY  01/29/2016   at [facility]  . CHOLECYSTECTOMY    . ENDOSCOPIC RETROGRADE CHOLANGIOPANCREATOGRAPHY (ERCP) WITH PROPOFOL N/A 12/01/2015   Procedure: ENDOSCOPIC RETROGRADE CHOLANGIOPANCREATOGRAPHY (ERCP) WITH PROPOFOL;  Surgeon: [name], MD;  [facility];  Service: Endoscopy;  Laterality: N/A;  . ENDOSCOPIC RETROGRADE CHOLANGIOPANCREATOGRAPHY (ERCP) WITH PROPOFOL N/A 02/01/2016   Procedure: ENDOSCOPIC RETROGRADE CHOLANGIOPANCREATOGRAPHY (ERCP) WITH PROPOFOL;  Surgeon: [name], MD;  [facility];  Service: Endoscopy;  Laterality: N/A;  . ENDOSCOPIC RETROGRADE CHOLANGIOPANCREATOGRAPHY (ERCP) WITH PROPOFOL N/A 04/07/2018   Procedure: ENDOSCOPIC RETROGRADE CHOLANGIOPANCREATOGRAPHY (ERCP) WITH PROPOFOL;  Surgeon: [name], MD;  [facility];  Service: Endoscopy;  Laterality: N/A;  . ESOPHAGOGASTRODUODENOSCOPY (EGD) WITH PROPOFOL N/A 08/06/2019   Procedure: ESOPHAGOGASTRODUODENOSCOPY (EGD) WITH PROPOFOL;  Surgeon: Gatha Mayer, MD;  [facility];  Service: Gastroenterology;  Laterality: N/A;  . HAMMER TOE SURGERY    . HEMORRHOID SURGERY    . Hyperplastic colon polyps, removed  2007   By Dr. Penelope Coop  . JOINT REPLACEMENT  2011   right knee  . OOPHORECTOMY    . REMOVAL OF STONES  04/07/2018   Procedure:  REMOVAL OF STONES;  Surgeon: [name], MD;  Location: [facility];  Service: Endoscopy;;  . ROTATOR CUFF REPAIR  2004   left (Dr. Durward Fortes)  . SPHINCTEROTOMY  04/07/2018   Procedure: SPHINCTEROTOMY;  Surgeon: [name], MD;  Location: [facility];  Service: Endoscopy;;  . TOTAL HIP ARTHROPLASTY Right 11/26/2016   Procedure: RIGHT TOTAL HIP ARTHROPLASTY;  Surgeon: Garald Balding, MD;  Location: [facility];  Service: Orthopedics;  Laterality: Right;  . TOTAL KNEE ARTHROPLASTY Right   . TOTAL KNEE ARTHROPLASTY Left 07/26/2019   Procedure: TOTAL KNEE ARTHROPLASTY;  Surgeon: Gaynelle Arabian, MD;  [facility];  Service: Orthopedics;  Laterality: Left;  31min   Social History   Occupational History  . Occupation: Producer, television/film/video: RETIRED    Comment: retired  Tobacco Use  . Smoking status: Never Smoker  . Smokeless tobacco: Never Used  Vaping Use  . Vaping Use: Never used  Substance and Sexual Activity  . Alcohol use: No  . Drug use: No  . Sexual activity: Not Currently    Birth control/protection: Surgical

## 2020-07-27 NOTE — Progress Notes (Signed)
Cardiology Office Note   Date:  07/28/2020   ID:  Angles, Trevizo 04-06-1932, MRN [id number]  PCP:  Janith Lima, MD  Cardiologist:   Dorris Carnes, MD    F/U of dyspnea     History of Present Illness:    Patient is an 86 yo with hx of dyspnea, Diastolic dysfunction, GERD, aortic sclerosis    Echo showed Gr II diastolic dysfunction with increased filling pressures.  PFTs done that showed decreased DLCO consistent with vascular process.    She is followed in pulmonary clinic.  CT showed minimal ground glass as well as a small nodule RUL that is being followed.      I saw the pt in Jan 2021  Since seen the pt says her breathing is stable   She denies CP   BP in 130s to 140s at home      Outpatient Medications Prior to Visit  Medication Sig Dispense Refill  . Calcium Carb-Cholecalciferol (CALCIUM CARBONATE-VITAMIN D3 PO) Take 1 tablet by mouth daily.     . carvedilol (COREG) 12.5 MG tablet Take 1 tablet (12.5 mg total) by mouth 2 (two) times daily with a meal. 180 tablet 1  . cholecalciferol (VITAMIN D) 1000 units tablet Take 1,000 Units by mouth daily.    . Multiple Vitamin (MULTIVITAMIN WITH MINERALS) TABS tablet Take 1 tablet by mouth daily.    [name] torsemide (DEMADEX) 20 MG tablet TAKE 1 TABLET BY MOUTH EVERY DAY 90 tablet 1  . traMADol (ULTRAM) 50 MG tablet Take 1-2 tablets (50-100 mg total) by mouth every 12 (twelve) hours as needed for moderate pain. 56 tablet 0  . vitamin C (ASCORBIC ACID) 500 MG tablet Take 500 mg by mouth daily.    . pantoprazole (PROTONIX) 40 MG tablet TAKE 1 TABLET BY MOUTH EVERY DAY (Patient not taking: Reported on 07/28/2020) 90 tablet 1   No facility-administered medications prior to visit.     Allergies:   Sulfonamide derivatives   Past Medical History:  Diagnosis Date  . ANEMIA 08/22/2009  . Aortic stenosis   . AR (aortic regurgitation) 01/2019   Mild to Moderate, Noted on ECHO  . CAROTID ARTERY STENOSIS 01/16/2010  . Complication of  anesthesia    very hard to wake up from surgery  . DYSPNEA ON EXERTION 11/16/2007  . Gallstones   . GEN OSTEOARTHROSIS INVOLVING MULTIPLE SITES 11/11/2008  . GERD (gastroesophageal reflux disease)   . Grade I diastolic dysfunction 07/4075   Noted on ECHO  . Heart murmur   . HEMORRHOIDS, INTERNAL    surgery was in the 90's (per patient)  . History of blood transfusion    after hip and knee replacements  . NECK PAIN, ACUTE 12/28/2009  . PERIPHERAL EDEMA 10/06/2007  . PONV (postoperative nausea and vomiting)   . Pulmonary nodule 06/18/2019   noted on CT Chest   . PVD 10/06/2007  . Unspecified essential hypertension 10/19/2007    Past Surgical History:  Procedure Laterality Date  . ABDOMINAL HYSTERECTOMY    . BIOPSY  08/06/2019   Procedure: BIOPSY;  Surgeon: Gatha Mayer, MD;  Location: [facility];  Service: Gastroenterology;;  . [name]     bilat  . CARPAL TUNNEL RELEASE    . CHOLECYSTECTOMY  01/29/2016   at [facility]  . CHOLECYSTECTOMY    . ENDOSCOPIC RETROGRADE CHOLANGIOPANCREATOGRAPHY (ERCP) WITH PROPOFOL N/A 12/01/2015   Procedure: ENDOSCOPIC RETROGRADE CHOLANGIOPANCREATOGRAPHY (ERCP) WITH PROPOFOL;  Surgeon: [name], MD;  [facility];  Service: Endoscopy;  Laterality: N/A;  . ENDOSCOPIC RETROGRADE CHOLANGIOPANCREATOGRAPHY (ERCP) WITH PROPOFOL N/A 02/01/2016   Procedure: ENDOSCOPIC RETROGRADE CHOLANGIOPANCREATOGRAPHY (ERCP) WITH PROPOFOL;  Surgeon: [name], MD;  [facility];  Service: Endoscopy;  Laterality: N/A;  . ENDOSCOPIC RETROGRADE CHOLANGIOPANCREATOGRAPHY (ERCP) WITH PROPOFOL N/A 04/07/2018   Procedure: ENDOSCOPIC RETROGRADE CHOLANGIOPANCREATOGRAPHY (ERCP) WITH PROPOFOL;  Surgeon: [name], MD;  [facility];  Service: Endoscopy;  Laterality: N/A;  . ESOPHAGOGASTRODUODENOSCOPY (EGD) WITH PROPOFOL N/A 08/06/2019   Procedure: ESOPHAGOGASTRODUODENOSCOPY (EGD) WITH PROPOFOL;  Surgeon: Gatha Mayer, MD;  [facility];  Service: Gastroenterology;  Laterality: N/A;  . HAMMER TOE SURGERY    . HEMORRHOID SURGERY    . Hyperplastic colon polyps, removed  2007   By Dr. Penelope Coop  . JOINT REPLACEMENT  2011   right knee  . OOPHORECTOMY    . REMOVAL OF STONES  04/07/2018   Procedure: REMOVAL OF STONES;  Surgeon: [name], MD;  [facility];  Service: Endoscopy;;  . ROTATOR CUFF REPAIR  2004   left (Dr. Durward Fortes)  . SPHINCTEROTOMY  04/07/2018   Procedure: SPHINCTEROTOMY;  Surgeon: [name], MD;  Location: [facility];  Service: Endoscopy;;  . TOTAL HIP ARTHROPLASTY Right 11/26/2016   Procedure: RIGHT TOTAL HIP ARTHROPLASTY;  Surgeon: Garald Balding, MD;  Location: [facility];  Service: Orthopedics;  Laterality: Right;  . TOTAL KNEE ARTHROPLASTY Right   . TOTAL KNEE ARTHROPLASTY Left 07/26/2019   Procedure: TOTAL KNEE ARTHROPLASTY;  Surgeon: Gaynelle Arabian, MD;  [facility];  Service: Orthopedics;  Laterality: Left;  56min     Social History:  The patient  reports that she has never smoked. She has never used smokeless tobacco. She reports that she does not drink alcohol and does not use drugs.   Family History:  The patient's family history includes Cancer in her brother, father, and mother; Leukemia in her father; Lung cancer in her father; Ovarian cancer in her mother.    ROS:  Please see the history of present illness. All other systems are reviewed and  Negative to the above problem except as noted.    PHYSICAL EXAM: VS:  BP (!) 160/60   Pulse 72   Ht 5\' 5"  (1.651 m)   Wt 166 lb 9.6 oz (75.6 kg)   SpO2 94%   BMI 27.72 kg/m   GEN: Overweight 86 yo  in no acute distress  HEENT: normal  Neck: no JVD Cardiac: RRR; Gr I/VI systolic murmur; Triv LE  edema  Respiratory:  clear to auscultation bilaterally, normal work of breathing GI: soft, nontender, nondistended, + BS  No hepatomegaly  MS: no deformity Moving all extremities   Skin: warm and  dry, no rash Neuro:  Strength and sensation are intact Psych: euthymic mood, full affect?   EKG:  EKG is ordered today.  SR 72 bpm    Lipid Panel    Component Value Date/Time   CHOL 108 05/17/2020 1102   CHOL 123 08/14/2017 1143   TRIG 69.0 05/17/2020 1102   HDL 51.10 05/17/2020 1102   HDL 64 08/14/2017 1143   CHOLHDL 2 05/17/2020 1102   VLDL 13.8 05/17/2020 1102   LDLCALC 43 05/17/2020 1102   LDLCALC 45 08/14/2017 1143   LDLDIRECT 56.0 07/08/2018 1518      Wt Readings from Last 3 Encounters:  07/28/20 166 lb 9.6 oz (75.6 kg)  07/06/20 164 lb (74.4 kg)  06/29/20 164 lb (74.4  kg)      ASSESSMENT AND PLAN: 1  HTN  BP is high today   Would recomm that the pt keep log of BPs and bring in  She says her BP is better at home   Bring cuff to HTN clinic in 1 month  2Dyspnea  Last echo in 2774 Gr I diastolic dysfunciton     Increased filling pressures  Breathing is stable   Volume status is OK Keep on same meds    3  Aortic valve sclerosis   No stensosis on echo in 2019    4  CV dz  Mild plaquing in 2018  Keep lipids in contrl    5  LIpids  Excellent despite no statin    LDL 43  HDL 51    6  Anemia  Follows in IM  Last hgb 9.9     Plan for f/u at the end of August     Current medicines are reviewed at length with the patient today.  The patient does not have concerns regarding medicines.  Signed, Dorris Carnes, MD  07/28/2020 3:12 PM    [facility] [address], [place], [place]  [address] Phone: [phone]; Fax: [phone]

## 2020-07-28 ENCOUNTER — Ambulatory Visit: Payer: Medicare Other | Admitting: Internal Medicine

## 2020-07-28 ENCOUNTER — Other Ambulatory Visit: Payer: Self-pay

## 2020-07-28 ENCOUNTER — Encounter: Payer: Self-pay | Admitting: Internal Medicine

## 2020-07-28 VITALS — BP 160/60 | HR 72 | Ht 65.0 in | Wt 166.6 lb

## 2020-07-28 DIAGNOSIS — I10 Essential (primary) hypertension: Secondary | ICD-10-CM | POA: Diagnosis not present

## 2020-07-28 NOTE — Patient Instructions (Signed)
Medication Instructions:  No changes *If you need a refill on your cardiac medications before your next appointment, please call your pharmacy*   Lab Work: none If you have labs (blood work) drawn today and your tests are completely normal, you will receive your results only by: [name] MyChart Message (if you have MyChart) OR . A paper copy in the mail If you have any lab test that is abnormal or we need to change your treatment, we will call you to review the results.   Testing/Procedures: none   Follow-Up: Your physician recommends that you schedule a follow-up appointment in: 4 weeks at [facility] with one of our Pharmacists.    Other Instructions Bring cuff and list of blood pressure readings to your next appointment.

## 2020-08-01 ENCOUNTER — Other Ambulatory Visit: Payer: Self-pay

## 2020-08-01 ENCOUNTER — Encounter: Payer: Self-pay | Admitting: Orthopaedic Surgery

## 2020-08-01 ENCOUNTER — Ambulatory Visit: Payer: Medicare Other | Admitting: Orthopaedic Surgery

## 2020-08-01 VITALS — Ht 65.0 in | Wt 166.0 lb

## 2020-08-01 DIAGNOSIS — G5602 Carpal tunnel syndrome, left upper limb: Secondary | ICD-10-CM

## 2020-08-01 NOTE — Progress Notes (Signed)
Office Visit Note   Patient: Claudia Lara           Date of Birth: Jul 03, 1932           MRN: [id number] Visit Date: 08/01/2020              Requested by: Janith Lima, MD [address] [place],  [place] [address] PCP: Janith Lima, MD   Assessment & Plan: Visit Diagnoses:  1. Carpal tunnel syndrome, left upper limb     Plan: 6 weeks status post left carpal tunnel release and doing quite well.  No longer has any significant pain and not dropping objects.  Still having some tingling into her radial 3 digits.  Happy with the results.  Preoperative EMGs and nerve conduction studies were consistent with severe left median nerve entrapment at the wrist. Dr. Ernestina Patches thought there may still be some residual permanent symptoms even after decompression.  Hopefully over time the tingling will resolve but it is not to the point where it compromises her activities of daily living  Follow-Up Instructions: Return in about 1 month (around 08/31/2020).   Orders:  No orders of the defined types were placed in this encounter.  No orders of the defined types were placed in this encounter.     Procedures: No procedures performed   Clinical Data: No additional findings.   Subjective: Chief Complaint  Patient presents with  . Left Wrist - Follow-up    Left carpal tunnel release 06/22/2020  Patient presents today for follow up on her left wrist. She had a left carpal tunnel release on 06/22/2020. She is now 6 weeks out from surgery. No pain. She states that she still has numbness and tingling in her fingers.   HPI  Review of Systems   Objective: Vital Signs: Ht 5\' 5"  (1.651 m)   Wt 166 lb (75.3 kg)   BMI 27.62 kg/m   Physical Exam  Ortho Exam left hand carpal tunnel incision healing without problem.  Just a little residual induration.  No redness.  No Tinel's over the median nerve.  Considerable degenerative changes in all the metacarpal phalangeal, PIP and DIP joints.  Unable  to make a full fist.  Good opposition of thumb the little finger and good capillary refill to all the fingers  Specialty Comments:  No specialty comments available.  Imaging: No results found.   PMFS History: Patient Active Problem List   Diagnosis Date Noted  . Carpal tunnel syndrome, left upper limb 04/05/2020  . Elevated LFTs 12/30/2019  . Iron deficiency anemia due to chronic blood loss 09/08/2019  . Interstitial pulmonary disease ([place]) 09/08/2019  . Lower leg DVT (deep venous thromboembolism), acute, left ([place]) 08/18/2019  . OA (osteoarthritis) of knee 07/26/2019  . Lung nodule 06/24/2019  . DDD (degenerative disc disease), cervical 04/16/2019  . Zinc deficiency 10/03/2017  . Spondylosis, lumbar, with myelopathy 04/09/2017  . GERD (gastroesophageal reflux disease) 08/03/2015  . Hemolytic anemia ([place]) 07/12/2015  . Chronic venous insufficiency 08/10/2014  . Aortic stenosis, mild 06/29/2014  . Left ventricular diastolic dysfunction, NYHA class 1 06/29/2014  . Senile osteopenia 04/21/2014  . Hyperlipidemia with target LDL less than 130 04/21/2014  . Kidney disease, chronic, stage III (GFR 30-59 ml/min) ([place]) 04/21/2014  . Routine health maintenance 07/16/2012  . Carotid artery stenosis without cerebral infarction 01/16/2010  . Essential hypertension 10/19/2007   Past Medical History:  Diagnosis Date  . ANEMIA 08/22/2009  . Aortic stenosis   .  AR (aortic regurgitation) 01/2019   Mild to Moderate, Noted on ECHO  . CAROTID ARTERY STENOSIS 01/16/2010  . Complication of anesthesia    very hard to wake up from surgery  . DYSPNEA ON EXERTION 11/16/2007  . Gallstones   . GEN OSTEOARTHROSIS INVOLVING MULTIPLE SITES 11/11/2008  . GERD (gastroesophageal reflux disease)   . Grade I diastolic dysfunction 19/5093   Noted on ECHO  . Heart murmur   . HEMORRHOIDS, INTERNAL    surgery was in the 90's (per patient)  . History of blood transfusion    after hip and knee replacements    . NECK PAIN, ACUTE 12/28/2009  . PERIPHERAL EDEMA 10/06/2007  . PONV (postoperative nausea and vomiting)   . Pulmonary nodule 06/18/2019   noted on CT Chest   . PVD 10/06/2007  . Unspecified essential hypertension 10/19/2007    Family History  Problem Relation Age of Onset  . Ovarian cancer Mother   . Cancer Mother   . Leukemia Father   . Lung cancer Father   . Cancer Father   . Cancer Brother   . Diabetes Neg Hx   . Heart disease Neg Hx   . Hypertension Neg Hx     Past Surgical History:  Procedure Laterality Date  . ABDOMINAL HYSTERECTOMY    . BIOPSY  08/06/2019   Procedure: BIOPSY;  Surgeon: Gatha Mayer, MD;  Location: [facility];  Service: Gastroenterology;;  . [name]     bilat  . CARPAL TUNNEL RELEASE    . CHOLECYSTECTOMY  01/29/2016   at [facility]  . CHOLECYSTECTOMY    . ENDOSCOPIC RETROGRADE CHOLANGIOPANCREATOGRAPHY (ERCP) WITH PROPOFOL N/A 12/01/2015   Procedure: ENDOSCOPIC RETROGRADE CHOLANGIOPANCREATOGRAPHY (ERCP) WITH PROPOFOL;  Surgeon: [name], MD;  [facility];  Service: Endoscopy;  Laterality: N/A;  . ENDOSCOPIC RETROGRADE CHOLANGIOPANCREATOGRAPHY (ERCP) WITH PROPOFOL N/A 02/01/2016   Procedure: ENDOSCOPIC RETROGRADE CHOLANGIOPANCREATOGRAPHY (ERCP) WITH PROPOFOL;  Surgeon: [name], MD;  [facility];  Service: Endoscopy;  Laterality: N/A;  . ENDOSCOPIC RETROGRADE CHOLANGIOPANCREATOGRAPHY (ERCP) WITH PROPOFOL N/A 04/07/2018   Procedure: ENDOSCOPIC RETROGRADE CHOLANGIOPANCREATOGRAPHY (ERCP) WITH PROPOFOL;  Surgeon: [name], MD;  [facility];  Service: Endoscopy;  Laterality: N/A;  . ESOPHAGOGASTRODUODENOSCOPY (EGD) WITH PROPOFOL N/A 08/06/2019   Procedure: ESOPHAGOGASTRODUODENOSCOPY (EGD) WITH PROPOFOL;  Surgeon: Gatha Mayer, MD;  [facility];  Service: Gastroenterology;  Laterality: N/A;  . HAMMER TOE SURGERY    . HEMORRHOID SURGERY    . Hyperplastic colon polyps,  removed  2007   By Dr. Penelope Coop  . JOINT REPLACEMENT  2011   right knee  . OOPHORECTOMY    . REMOVAL OF STONES  04/07/2018   Procedure: REMOVAL OF STONES;  Surgeon: [name], MD;  [facility];  Service: Endoscopy;;  . ROTATOR CUFF REPAIR  2004   left (Dr. Durward Fortes)  . SPHINCTEROTOMY  04/07/2018   Procedure: SPHINCTEROTOMY;  Surgeon: [name], MD;  Location: [facility];  Service: Endoscopy;;  . TOTAL HIP ARTHROPLASTY Right 11/26/2016   Procedure: RIGHT TOTAL HIP ARTHROPLASTY;  Surgeon: Garald Balding, MD;  Location: [place];  Service: Orthopedics;  Laterality: Right;  . TOTAL KNEE ARTHROPLASTY Right   . TOTAL KNEE ARTHROPLASTY Left 07/26/2019   Procedure: TOTAL KNEE ARTHROPLASTY;  Surgeon: Gaynelle Arabian, MD;  [facility];  Service: Orthopedics;  Laterality: Left;  34min   Social History   Occupational History  . Occupation: Producer, television/film/video: RETIRED  Comment: retired  Tobacco Use  . Smoking status: Never Smoker  . Smokeless tobacco: Never Used  Vaping Use  . Vaping Use: Never used  Substance and Sexual Activity  . Alcohol use: No  . Drug use: No  . Sexual activity: Not Currently    Birth control/protection: Surgical

## 2020-08-03 ENCOUNTER — Ambulatory Visit: Payer: Medicare Other | Admitting: Orthopaedic Surgery

## 2020-08-03 DIAGNOSIS — Z96652 Presence of left artificial knee joint: Secondary | ICD-10-CM | POA: Diagnosis not present

## 2020-08-17 ENCOUNTER — Other Ambulatory Visit: Payer: Self-pay | Admitting: Internal Medicine

## 2020-08-17 DIAGNOSIS — I5189 Other ill-defined heart diseases: Secondary | ICD-10-CM

## 2020-08-17 DIAGNOSIS — I10 Essential (primary) hypertension: Secondary | ICD-10-CM

## 2020-08-24 ENCOUNTER — Ambulatory Visit (INDEPENDENT_AMBULATORY_CARE_PROVIDER_SITE_OTHER): Payer: Medicare Other | Admitting: Pharmacist

## 2020-08-24 ENCOUNTER — Other Ambulatory Visit: Payer: Self-pay

## 2020-08-24 VITALS — BP 126/54 | HR 74

## 2020-08-24 DIAGNOSIS — I10 Essential (primary) hypertension: Secondary | ICD-10-CM | POA: Diagnosis not present

## 2020-08-24 MED ORDER — HYDROCHLOROTHIAZIDE 12.5 MG PO CAPS: 12.5000 mg | ORAL_CAPSULE | Freq: Every day | ORAL | 11 refills | Status: DC

## 2020-08-24 NOTE — Patient Instructions (Addendum)
It was nice to meet you today!  Your blood pressure goal is < 130/84mmHg  STOP taking torsemide  START taking hydrochlorothiazide (HCTZ) 12.5mg  - 1 capsule once a day in the morning  Continue taking your other medications  Monitor your blood pressure at home and keep an eye on the swelling in your ankles  Follow up in clinic in

## 2020-08-24 NOTE — Progress Notes (Signed)
Patient ID: Claudia Lara                 DOB: [birth year]                      MRN: [id number]     HPI: Claudia Lara is a 86 y.o. female referred by Dr. Harrington Challenger to HTN clinic. PMH is significant for diastolic dysfunction, aortic valve sclerosis, PVD, HTN, carotid artery stenosis, DVT, GERD, and dyspnea. PT was seen in clinic a month ago and BP was elevated at 160/60. She reported better readings at home and was advised to monitor her BP.  Pt presents today in good spirits. She has been monitoring her BP for years. No issues tolerating her current medications. Denies dizziness, blurred vision, and headaches. She only takes her torsemide once or twice a week since it makes her go to the bathroom so much. Didn't take it at all last week while she was at the beach. States she only took it 1-2x per week when she was on lower 10mg  dosing a few years ago too. Reports lack of energy but states this has been going on for years.  Doesn't drink any caffeine, occasionally adds salt to food. Eats out about twice a week. Does have arthritis and bone spurs (left leg more swollen from this). Takes Tylenol or tramadol for pain, also rubs hemp salve on her knees and hips which is effective for her.  Measures BP using bicep cuff she has had for years. She brings in her cuff today, reading in clinic was 141/61 which was close to manual reading of 136/56 (improved to 126/54 10 mins later). Most home readings above goal, however ~10 at goal over the past month if I subtract 52mmHg from systolic reading based on comparison with manual cuff today.    Current HTN meds: carvedilol 12.5mg  BID, torsemide 20mg  daily  BP goal: <130/8mmHg  Family History: Cancer in her brother, father, and mother; Leukemia in her father; Lung cancer in her father; Ovarian cancer in her mother.   Social History: The patient  reports that she has never smoked. She has never used smokeless tobacco. She reports that she does not drink alcohol and  does not use drugs.   Diet: Breakfast - grits and muffin, sometimes will have cereal and banana, sometimes bacon and eggs, will have 1-2 cups of decaf coffee and OJ. Lunch - snacks, likes fruit. Dinner - country ham last night. Does like sweets. Eats most meals at home, may go out to eat twice a week with friends. Drinks lemonade, water, occasionally a sprite.  Home BP readings: Has checked daily for the past month at various times of the day - mainly 130-150s/40-60s, 3 readings at goal, ~27 above goal. 10 at goal if subtracting 32mmHg from systolic per clinic comparison today HR 60s-80s.  Wt Readings from Last 3 Encounters:  08/01/20 166 lb (75.3 kg)  07/28/20 166 lb 9.6 oz (75.6 kg)  07/06/20 164 lb (74.4 kg)   BP Readings from Last 3 Encounters:  07/28/20 (!) 160/60  05/17/20 (!) 158/70  02/10/20 (!) 156/58   Pulse Readings from Last 3 Encounters:  07/28/20 72  05/17/20 89  02/10/20 74    Renal function: CrCl cannot be calculated (Patient's most recent lab result is older than the maximum 21 days allowed.).  Past Medical History:  Diagnosis Date  . ANEMIA 08/22/2009  . Aortic stenosis   . AR (aortic regurgitation) 01/2019  Mild to Moderate, Noted on ECHO  . CAROTID ARTERY STENOSIS 01/16/2010  . Complication of anesthesia    very hard to wake up from surgery  . DYSPNEA ON EXERTION 11/16/2007  . Gallstones   . GEN OSTEOARTHROSIS INVOLVING MULTIPLE SITES 11/11/2008  . GERD (gastroesophageal reflux disease)   . Grade I diastolic dysfunction 38/7564   Noted on ECHO  . Heart murmur   . HEMORRHOIDS, INTERNAL    surgery was in the 90's (per patient)  . History of blood transfusion    after hip and knee replacements  . NECK PAIN, ACUTE 12/28/2009  . PERIPHERAL EDEMA 10/06/2007  . PONV (postoperative nausea and vomiting)   . Pulmonary nodule 06/18/2019   noted on CT Chest   . PVD 10/06/2007  . Unspecified essential hypertension 10/19/2007    Current Outpatient Medications  on File Prior to Visit  Medication Sig Dispense Refill  . Calcium Carb-Cholecalciferol (CALCIUM CARBONATE-VITAMIN D3 PO) Take 1 tablet by mouth daily.     . carvedilol (COREG) 12.5 MG tablet TAKE 1 TABLET BY MOUTH 2 TIMES DAILY WITH A MEAL 180 tablet 1  . cholecalciferol (VITAMIN D) 1000 units tablet Take 1,000 Units by mouth daily.    . Multiple Vitamin (MULTIVITAMIN WITH MINERALS) TABS tablet Take 1 tablet by mouth daily.    [name] torsemide (DEMADEX) 20 MG tablet TAKE 1 TABLET BY MOUTH EVERY DAY 90 tablet 1  . traMADol (ULTRAM) 50 MG tablet Take 1-2 tablets (50-100 mg total) by mouth every 12 (twelve) hours as needed for moderate pain. 56 tablet 0  . vitamin C (ASCORBIC ACID) 500 MG tablet Take 500 mg by mouth daily.    . [DISCONTINUED] carvedilol (COREG) 12.5 MG tablet Take 1 tablet (12.5 mg total) by mouth 2 (two) times daily with a meal. 180 tablet 1  . [DISCONTINUED] pantoprazole (PROTONIX) 40 MG tablet TAKE 1 TABLET BY MOUTH EVERY DAY 90 tablet 1   No current facility-administered medications on file prior to visit.    Allergies  Allergen Reactions  . Sulfonamide Derivatives Rash    Childhood reaction     Assessment/Plan:  1. Hypertension - BP slightly elevated in clinic but at goal <130/22mmHg on recheck, however > 50% of readings at home remain elevated. Pt does have some mild LEE in her ankles noted today and had not taken her torsemide at all in the last week or so due to frequent urination. Will try stopping torsemide and start low dose HCTZ 12.5mg  daily for more mild but consistent diuresis since she doesn't tolerate daily loop diuretic dosing. Continue carvedilol 12.5mg  BID. Pt will continue to monitor BP at home. Will follow up in 1 month for BP check and BMET.  Raeleen Winstanley E. Trenda Corliss, PharmD, BCACP, [facility] [address] [place], [place] [address] Phone: [phone]; Fax: [phone] 08/24/2020 2:16 PM

## 2020-08-31 ENCOUNTER — Encounter: Payer: Self-pay | Admitting: Orthopaedic Surgery

## 2020-08-31 ENCOUNTER — Other Ambulatory Visit: Payer: Self-pay

## 2020-08-31 ENCOUNTER — Ambulatory Visit: Payer: Medicare Other | Admitting: Orthopaedic Surgery

## 2020-08-31 VITALS — Ht 65.0 in | Wt 166.0 lb

## 2020-08-31 DIAGNOSIS — G5602 Carpal tunnel syndrome, left upper limb: Secondary | ICD-10-CM

## 2020-08-31 NOTE — Progress Notes (Signed)
Office Visit Note   Patient: Claudia Lara           Date of Birth: 14-Dec-1931           MRN: [id number] Visit Date: 08/31/2020              Requested by: Janith Lima, MD [address] [place],  [place] [address] PCP: Janith Lima, MD   Assessment & Plan: Visit Diagnoses:  1. Carpal tunnel syndrome, left upper limb     Plan: Approximately 9 weeks status post left carpal tunnel release and doing well.  Still has some tingling into the middle 3 digits and specifically "the tips".  But otherwise does not have any other problems.  She feels like she is better.  Has significant arthritis involving both of her hands with incomplete grip.  We will plan to see her back as needed.  She is done well.  She had significant compression of the median nerve and it might very well be months before she reaches maximum improvement.  Hopefully the tingling will resolve  Follow-Up Instructions: Return if symptoms worsen or fail to improve.   Orders:  No orders of the defined types were placed in this encounter.  No orders of the defined types were placed in this encounter.     Procedures: No procedures performed   Clinical Data: No additional findings.   Subjective: Chief Complaint  Patient presents with  . Left Wrist - Follow-up    Left carpal tunnel release 06/22/2020  Patient presents today for follow up on her wrist. She had carpal tunnel release on 06/22/2020 and is now 10weeks out from surgery. Patient states that she has improved some since her last visit. She still tingling in her second, third, and fourth fingers. Her thumb and pinky finger are normal again.   HPI  Review of Systems  Constitutional: Negative for fatigue.  HENT: Negative for ear pain.   Eyes: Negative for pain.  Respiratory: Negative for shortness of breath.   Cardiovascular: Negative for leg swelling.  Gastrointestinal: Negative for diarrhea.  Endocrine: Negative for cold intolerance and heat  intolerance.  Genitourinary: Negative for difficulty urinating.  Musculoskeletal: Negative for joint swelling.  Skin: Negative for rash.  Allergic/Immunologic: Negative for food allergies.  Neurological: Negative for weakness.  Hematological: Does not bruise/bleed easily.  Psychiatric/Behavioral: Negative for sleep disturbance.     Objective: Vital Signs: Ht 5\' 5"  (1.651 m)   Wt 166 lb (75.3 kg)   BMI 27.62 kg/m   Physical Exam  Ortho Exam left carpal tunnel incision is healed without a problem.  No pain.  Just minimal thickening around the scar but not painful.  No Tinel's.  Good opposition of thumb little finger but does have atrophy.  No change from preoperative status.  Significant degenerative changes about the DIP PIP and MP joints of her hand which limits her ability to make a full fist.  Good capillary refill  Specialty Comments:  No specialty comments available.  Imaging: No results found.   PMFS History: Patient Active Problem List   Diagnosis Date Noted  . Carpal tunnel syndrome, left upper limb 04/05/2020  . Elevated LFTs 12/30/2019  . Iron deficiency anemia due to chronic blood loss 09/08/2019  . Interstitial pulmonary disease ([place]) 09/08/2019  . Lower leg DVT (deep venous thromboembolism), acute, left ([place]) 08/18/2019  . OA (osteoarthritis) of knee 07/26/2019  . Lung nodule 06/24/2019  . DDD (degenerative disc disease), cervical 04/16/2019  .  Zinc deficiency 10/03/2017  . Spondylosis, lumbar, with myelopathy 04/09/2017  . GERD (gastroesophageal reflux disease) 08/03/2015  . Hemolytic anemia ([place]) 07/12/2015  . Chronic venous insufficiency 08/10/2014  . Aortic stenosis, mild 06/29/2014  . Left ventricular diastolic dysfunction, NYHA class 1 06/29/2014  . Senile osteopenia 04/21/2014  . Hyperlipidemia with target LDL less than 130 04/21/2014  . Kidney disease, chronic, stage III (GFR 30-59 ml/min) ([place]) 04/21/2014  . Routine health maintenance 07/16/2012    . Carotid artery stenosis without cerebral infarction 01/16/2010  . Essential hypertension 10/19/2007   Past Medical History:  Diagnosis Date  . ANEMIA 08/22/2009  . Aortic stenosis   . AR (aortic regurgitation) 01/2019   Mild to Moderate, Noted on ECHO  . CAROTID ARTERY STENOSIS 01/16/2010  . Complication of anesthesia    very hard to wake up from surgery  . DYSPNEA ON EXERTION 11/16/2007  . Gallstones   . GEN OSTEOARTHROSIS INVOLVING MULTIPLE SITES 11/11/2008  . GERD (gastroesophageal reflux disease)   . Grade I diastolic dysfunction 18/5631   Noted on ECHO  . Heart murmur   . HEMORRHOIDS, INTERNAL    surgery was in the 90's (per patient)  . History of blood transfusion    after hip and knee replacements  . NECK PAIN, ACUTE 12/28/2009  . PERIPHERAL EDEMA 10/06/2007  . PONV (postoperative nausea and vomiting)   . Pulmonary nodule 06/18/2019   noted on CT Chest   . PVD 10/06/2007  . Unspecified essential hypertension 10/19/2007    Family History  Problem Relation Age of Onset  . Ovarian cancer Mother   . Cancer Mother   . Leukemia Father   . Lung cancer Father   . Cancer Father   . Cancer Brother   . Diabetes Neg Hx   . Heart disease Neg Hx   . Hypertension Neg Hx     Past Surgical History:  Procedure Laterality Date  . ABDOMINAL HYSTERECTOMY    . BIOPSY  08/06/2019   Procedure: BIOPSY;  Surgeon: Gatha Mayer, MD;  Location: [facility];  Service: Gastroenterology;;  . [name]     bilat  . CARPAL TUNNEL RELEASE    . CHOLECYSTECTOMY  01/29/2016   at [facility]  . CHOLECYSTECTOMY    . ENDOSCOPIC RETROGRADE CHOLANGIOPANCREATOGRAPHY (ERCP) WITH PROPOFOL N/A 12/01/2015   Procedure: ENDOSCOPIC RETROGRADE CHOLANGIOPANCREATOGRAPHY (ERCP) WITH PROPOFOL;  Surgeon: [name], MD;  Location: [facility];  Service: Endoscopy;  Laterality: N/A;  . ENDOSCOPIC RETROGRADE CHOLANGIOPANCREATOGRAPHY (ERCP) WITH PROPOFOL N/A 02/01/2016   Procedure:  ENDOSCOPIC RETROGRADE CHOLANGIOPANCREATOGRAPHY (ERCP) WITH PROPOFOL;  Surgeon: [name], MD;  Location: [facility];  Service: Endoscopy;  Laterality: N/A;  . ENDOSCOPIC RETROGRADE CHOLANGIOPANCREATOGRAPHY (ERCP) WITH PROPOFOL N/A 04/07/2018   Procedure: ENDOSCOPIC RETROGRADE CHOLANGIOPANCREATOGRAPHY (ERCP) WITH PROPOFOL;  Surgeon: [name], MD;  Location: [facility];  Service: Endoscopy;  Laterality: N/A;  . ESOPHAGOGASTRODUODENOSCOPY (EGD) WITH PROPOFOL N/A 08/06/2019   Procedure: ESOPHAGOGASTRODUODENOSCOPY (EGD) WITH PROPOFOL;  Surgeon: Gatha Mayer, MD;  Location: [facility];  Service: Gastroenterology;  Laterality: N/A;  . HAMMER TOE SURGERY    . HEMORRHOID SURGERY    . Hyperplastic colon polyps, removed  2007   By Dr. Penelope Coop  . JOINT REPLACEMENT  2011   right knee  . OOPHORECTOMY    . REMOVAL OF STONES  04/07/2018   Procedure: REMOVAL OF STONES;  Surgeon: [name], MD;  Location: [facility];  Service: Endoscopy;;  . ROTATOR CUFF REPAIR  2004  left (Dr. Durward Fortes)  . SPHINCTEROTOMY  04/07/2018   Procedure: SPHINCTEROTOMY;  Surgeon: [name], MD;  Location: [facility];  Service: Endoscopy;;  . TOTAL HIP ARTHROPLASTY Right 11/26/2016   Procedure: RIGHT TOTAL HIP ARTHROPLASTY;  Surgeon: Garald Balding, MD;  Location: [facility];  Service: Orthopedics;  Laterality: Right;  . TOTAL KNEE ARTHROPLASTY Right   . TOTAL KNEE ARTHROPLASTY Left 07/26/2019   Procedure: TOTAL KNEE ARTHROPLASTY;  Surgeon: Gaynelle Arabian, MD;  Location: [facility];  Service: Orthopedics;  Laterality: Left;  9min   Social History   Occupational History  . Occupation: Producer, television/film/video: RETIRED    Comment: retired  Tobacco Use  . Smoking status: Never Smoker  . Smokeless tobacco: Never Used  Vaping Use  . Vaping Use: Never used  Substance and Sexual Activity  . Alcohol use: No  . Drug use: No  . Sexual activity: Not Currently    Birth control/protection: Surgical

## 2020-09-21 ENCOUNTER — Other Ambulatory Visit: Payer: Self-pay

## 2020-09-21 ENCOUNTER — Ambulatory Visit (INDEPENDENT_AMBULATORY_CARE_PROVIDER_SITE_OTHER): Payer: Medicare Other | Admitting: Pharmacist

## 2020-09-21 VITALS — BP 126/60 | HR 72

## 2020-09-21 DIAGNOSIS — I10 Essential (primary) hypertension: Secondary | ICD-10-CM | POA: Diagnosis not present

## 2020-09-21 NOTE — Patient Instructions (Addendum)
It was nice to see you today!  Your blood pressure goal is < 130/60mmHg  Continue taking your current medications - carvedilol twice daily for your blood pressure  Use your torsemide as needed for swelling  Monitor your blood pressure at home. Make sure you've been sitting for at least 5-10 minutes and are feeling relaxed before you check your pressure  I'll give you a call in a few weeks to see how your pressures have been looking

## 2020-09-21 NOTE — Progress Notes (Signed)
Patient ID: Claudia Lara                 DOB: [birth year]                      MRN: [id number]     HPI: Claudia Lara is a 86 y.o. female referred by Dr. Harrington Challenger to HTN clinic. PMH is significant for diastolic dysfunction, aortic valve sclerosis, PVD, HTN, carotid artery stenosis, DVT, GERD, and dyspnea. At most recent visit in HTN clinic, BP had improved from 160/60 to 126/54, however most of home readings were elevated above goal and pt noted some swelling in her ankles. She only used her torsemide 1-2 days per week due to inconvenience of potent diuresis. Her torsemide was stopped and she was started on low dose daily HCTZ.  Pt presents today in good spirits. She stopped taking HCTZ after 5 days because it wasn't making her go to the bathroom more. Discussed that HCTZ was to provide less potent but more consistent diuresis than torsemide which she couldn't take regularly due to frequent urination, and that initiation of HCTZ was primarily to help lower her home BP readings. She takes her carvedilol at 8am and 5-6pm. Swelling in her legs is about normal - minimal on the right leg and more notable on the left due to her bone spurs.   Home readings over the past week: 153/55, 134/57, 127/54, 149/55, 153/62, 130/50, 165/60, 133/52, 150/54, HR 68 - 79. Of note, pt does not sit for 5-10 minutes before checking BP. Typically checks it right as she sits down. Home BP cuff also measured ~1JHER higher systolic than manual reading at last visit.  Doesn't drink any caffeine, occasionally adds salt to food. Eats out about twice a week. Does have arthritis and bone spurs (left leg more swollen from this). Takes Tylenol or tramadol for pain, also rubs hemp salve on her knees and hips which is effective for her.   Current HTN meds: carvedilol 12.5mg  BID  BP goal: <130/64mmHg  Family History: Cancer in her brother, father, and mother; Leukemia in her father; Lung cancer in her father; Ovarian cancer in her mother.     Social History: The patient  reports that she has never smoked. She has never used smokeless tobacco. She reports that she does not drink alcohol and does not use drugs.   Diet: Breakfast - grits and muffin, sometimes will have cereal and banana, sometimes bacon and eggs, will have 1-2 cups of decaf coffee and OJ. Lunch - snacks, likes fruit. Dinner - country ham last night. Does like sweets. Eats most meals at home, may go out to eat twice a week with friends. Drinks lemonade, water, occasionally a sprite.  Exercise: stays active with gardening  Wt Readings from Last 3 Encounters:  08/31/20 166 lb (75.3 kg)  08/01/20 166 lb (75.3 kg)  07/28/20 166 lb 9.6 oz (75.6 kg)   BP Readings from Last 3 Encounters:  08/24/20 (!) 126/54  07/28/20 (!) 160/60  05/17/20 (!) 158/70   Pulse Readings from Last 3 Encounters:  08/24/20 74  07/28/20 72  05/17/20 89    Renal function: CrCl cannot be calculated (Patient's most recent lab result is older than the maximum 21 days allowed.).  Past Medical History:  Diagnosis Date  . ANEMIA 08/22/2009  . Aortic stenosis   . AR (aortic regurgitation) 01/2019   Mild to Moderate, Noted on ECHO  . CAROTID ARTERY STENOSIS 01/16/2010  .  Complication of anesthesia    very hard to wake up from surgery  . DYSPNEA ON EXERTION 11/16/2007  . Gallstones   . GEN OSTEOARTHROSIS INVOLVING MULTIPLE SITES 11/11/2008  . GERD (gastroesophageal reflux disease)   . Grade I diastolic dysfunction 60/4540   Noted on ECHO  . Heart murmur   . HEMORRHOIDS, INTERNAL    surgery was in the 90's (per patient)  . History of blood transfusion    after hip and knee replacements  . NECK PAIN, ACUTE 12/28/2009  . PERIPHERAL EDEMA 10/06/2007  . PONV (postoperative nausea and vomiting)   . Pulmonary nodule 06/18/2019   noted on CT Chest   . PVD 10/06/2007  . Unspecified essential hypertension 10/19/2007    Current Outpatient Medications on File Prior to Visit  Medication Sig  Dispense Refill  . Calcium Carb-Cholecalciferol (CALCIUM CARBONATE-VITAMIN D3 PO) Take 1 tablet by mouth daily.     . carvedilol (COREG) 12.5 MG tablet TAKE 1 TABLET BY MOUTH 2 TIMES DAILY WITH A MEAL 180 tablet 1  . cholecalciferol (VITAMIN D) 1000 units tablet Take 1,000 Units by mouth daily.    . hydrochlorothiazide (MICROZIDE) 12.5 MG capsule Take 1 capsule (12.5 mg total) by mouth daily. 30 capsule 11  . Multiple Vitamin (MULTIVITAMIN WITH MINERALS) TABS tablet Take 1 tablet by mouth daily.    . traMADol (ULTRAM) 50 MG tablet Take 1-2 tablets (50-100 mg total) by mouth every 12 (twelve) hours as needed for moderate pain. 56 tablet 0  . vitamin C (ASCORBIC ACID) 500 MG tablet Take 500 mg by mouth daily.    . [DISCONTINUED] carvedilol (COREG) 12.5 MG tablet Take 1 tablet (12.5 mg total) by mouth 2 (two) times daily with a meal. 180 tablet 1  . [DISCONTINUED] pantoprazole (PROTONIX) 40 MG tablet TAKE 1 TABLET BY MOUTH EVERY DAY 90 tablet 1   No current facility-administered medications on file prior to visit.    Allergies  Allergen Reactions  . Sulfonamide Derivatives Rash    Childhood reaction     Assessment/Plan:  1. Hypertension - BP in clinic at goal <130/75mmHg in each arm. Home readings above goal about 50% of time, however pt has been checking BP as soon as she sits down rather than waiting 5-10 minutes. Will continue carvedilol 12.5mg  BID and have pt resume prn torsemide for swelling. She will not restart HCTZ at this time since clinic readings were at goal today. Advised her to wait for 10 minutes after sitting before checking BP at home and to continue recording her readings. I'll give her a call in a few weeks to follow up with home readings.  [name], PharmD, BCACP, [facility] [address] [place], [place] [address] Phone: [phone]; Fax: [phone] 09/21/2020 7:37 AM

## 2020-10-09 ENCOUNTER — Telehealth: Payer: Self-pay | Admitting: Pharmacist

## 2020-10-09 NOTE — Telephone Encounter (Signed)
Pt returned call to clinic. Home BP readings over the past few weeks as follows: 139/58,173/63, 137/56, 141/51,163/67,142/52, 151/56, 173/71,146/60, 164/55, 142/63, 174/66.  Still notes a normal amount of swelling in her legs but is not taking torsemide in the past week since she's been active gardening and doesn't want to take breaks to use restroom.  Will resume low dose HCTZ 12.5mg  daily. Pt aware to monitor BP at home. Will follow up in clinic in 2-3 weeks for lab work and BP check.

## 2020-10-09 NOTE — Telephone Encounter (Signed)
Left message for pt to discuss BP readings.

## 2020-10-25 ENCOUNTER — Ambulatory Visit (INDEPENDENT_AMBULATORY_CARE_PROVIDER_SITE_OTHER): Payer: Medicare Other | Admitting: Pharmacist

## 2020-10-25 ENCOUNTER — Other Ambulatory Visit: Payer: Self-pay

## 2020-10-25 VITALS — BP 126/53 | HR 80

## 2020-10-25 DIAGNOSIS — I10 Essential (primary) hypertension: Secondary | ICD-10-CM

## 2020-10-25 MED ORDER — HYDROCHLOROTHIAZIDE 12.5 MG PO CAPS: 12.5000 mg | ORAL_CAPSULE | Freq: Every day | ORAL | 3 refills | Status: DC

## 2020-10-25 NOTE — Patient Instructions (Addendum)
It was nice to see you today!  Your blood pressure goal is < 130/80mmHg  Continue taking your current medications  Dr Ronnald Ramp can check your labs on Monday to ensure your electrolytes are stable since starting HCTZ for your blood pressure

## 2020-10-25 NOTE — Progress Notes (Signed)
Patient ID: Claudia Lara                 DOB: 06-May-1932                      MRN: [id number]     HPI: Claudia Lara is a 86 y.o. female referred by Dr. Harrington Challenger to HTN clinic. PMH is significant for diastolic dysfunction, aortic valve sclerosis, PVD, HTN, carotid artery stenosis, DVT, GERD, and dyspnea. Recently, most home BP readings were elevated above goal and pt noted some swelling in her ankles. She only used her torsemide 1-2 days per week due to inconvenience of potent diuresis. Her torsemide was stopped and she was started on low dose daily HCTZ.  Pt presents today in good spirits. Reports tolerating HCTZ well. Denies dizziness, balance problems or falls. Reports her main problem remains fatigue/SOB that she has had since she was 72 and no one has been able to determine the cause. She does have anemia and has taken iron supplementation. Still notes some swelling in her ankles (a bit worse in her left ankle due her to bone spurs). Only takes torsemide prn - once a week max.   Doesn't drink any caffeine, occasionally adds salt to food. Eats out about twice a week. Takes Tylenol or tramadol for pain, also rubs hemp salve on her knees and hips which is effective for her.   Home BP readings as follows - cuff brought to last appt and SBP measured 108mmHg higher on her cuff compared to manual reading in clinic: 124/54, 139/52, 140/53, 136/49, 135/55, 123/50, 136/53, 129/51, 125/45, 164/63, 135/53, 138/53, 117/50, 136/51, 145/53, 141/53, HR 68 - 80.  Current HTN meds: carvedilol 12.5mg  BID (8am and 6pm), HCTZ 12.5mg  daily (8am), rarely takes torsemide  BP goal: <130/72mmHg  Family History: Cancer in her brother, father, and mother; Leukemia in her father; Lung cancer in her father; Ovarian cancer in her mother.   Social History: The patient  reports that she has never smoked. She has never used smokeless tobacco. She reports that she does not drink alcohol and does not use drugs.   Diet: Breakfast  - grits and muffin, sometimes will have cereal and banana, sometimes bacon and eggs, will have 1-2 cups of decaf coffee and OJ. Lunch - snacks, likes fruit. Dinner - country ham last night. Does like sweets. Eats most meals at home, may go out to eat twice a week with friends. Drinks lemonade, water, occasionally a sprite.  Exercise: stays active with gardening  Wt Readings from Last 3 Encounters:  08/31/20 166 lb (75.3 kg)  08/01/20 166 lb (75.3 kg)  07/28/20 166 lb 9.6 oz (75.6 kg)   BP Readings from Last 3 Encounters:  09/21/20 126/60  08/24/20 (!) 126/54  07/28/20 (!) 160/60   Pulse Readings from Last 3 Encounters:  09/21/20 72  08/24/20 74  07/28/20 72    Renal function: CrCl cannot be calculated (Patient's most recent lab result is older than the maximum 21 days allowed.).  Past Medical History:  Diagnosis Date  . ANEMIA 08/22/2009  . Aortic stenosis   . AR (aortic regurgitation) 01/2019   Mild to Moderate, Noted on ECHO  . CAROTID ARTERY STENOSIS 01/16/2010  . Complication of anesthesia    very hard to wake up from surgery  . DYSPNEA ON EXERTION 11/16/2007  . Gallstones   . GEN OSTEOARTHROSIS INVOLVING MULTIPLE SITES 11/11/2008  . GERD (gastroesophageal reflux disease)   .  Grade I diastolic dysfunction 32/1224   Noted on ECHO  . Heart murmur   . HEMORRHOIDS, INTERNAL    surgery was in the 90's (per patient)  . History of blood transfusion    after hip and knee replacements  . NECK PAIN, ACUTE 12/28/2009  . PERIPHERAL EDEMA 10/06/2007  . PONV (postoperative nausea and vomiting)   . Pulmonary nodule 06/18/2019   noted on CT Chest   . PVD 10/06/2007  . Unspecified essential hypertension 10/19/2007    Current Outpatient Medications on File Prior to Visit  Medication Sig Dispense Refill  . Calcium Carb-Cholecalciferol (CALCIUM CARBONATE-VITAMIN D3 PO) Take 1 tablet by mouth daily.     . carvedilol (COREG) 12.5 MG tablet TAKE 1 TABLET BY MOUTH 2 TIMES DAILY WITH A  MEAL 180 tablet 1  . cholecalciferol (VITAMIN D) 1000 units tablet Take 1,000 Units by mouth daily.    . Multiple Vitamin (MULTIVITAMIN WITH MINERALS) TABS tablet Take 1 tablet by mouth daily.    [name] torsemide (DEMADEX) 20 MG tablet Take 20 mg by mouth daily as needed.    . traMADol (ULTRAM) 50 MG tablet Take 1-2 tablets (50-100 mg total) by mouth every 12 (twelve) hours as needed for moderate pain. 56 tablet 0  . vitamin C (ASCORBIC ACID) 500 MG tablet Take 500 mg by mouth daily.    . [DISCONTINUED] carvedilol (COREG) 12.5 MG tablet Take 1 tablet (12.5 mg total) by mouth 2 (two) times daily with a meal. 180 tablet 1  . [DISCONTINUED] pantoprazole (PROTONIX) 40 MG tablet TAKE 1 TABLET BY MOUTH EVERY DAY 90 tablet 1   No current facility-administered medications on file prior to visit.    Allergies  Allergen Reactions  . Sulfonamide Derivatives Rash    Childhood reaction     Assessment/Plan:  1. Hypertension - BP in clinic at goal <130/83mmHg in clinic and with most home readings when 29mmHg is subtracted from her home cuff reading (slight discrepancy noted at last visit). Will continue carvedilol 12.5mg  BID and HCTZ 12.5mg  daily. She occasionally takes a torsemide for swelling in her ankles however doesn't like to take it regularly due to frequent urination, therefore ok to remain on daily thiazide and prn loop. Pt needs BMET checked with recent HCTZ start, however she sees Dr Ronnald Ramp next week for follow up. States he will check labs at that appt and she prefers to minimize blood draws. Will forward note to ensure BMET is drawn with labs next week. F/u in HTN clinic as needed.  Kila Godina E. Rick Carruthers, PharmD, BCACP, [facility] [address] [place], [place] [address] Phone: [phone]; Fax: [phone] 10/25/2020 7:22 AM

## 2020-10-30 ENCOUNTER — Ambulatory Visit (INDEPENDENT_AMBULATORY_CARE_PROVIDER_SITE_OTHER): Payer: Medicare Other | Admitting: Internal Medicine

## 2020-10-30 ENCOUNTER — Other Ambulatory Visit: Payer: Self-pay

## 2020-10-30 ENCOUNTER — Encounter: Payer: Self-pay | Admitting: Internal Medicine

## 2020-10-30 VITALS — BP 142/62 | HR 74 | Temp 97.9°F | Ht 65.0 in | Wt 166.0 lb

## 2020-10-30 DIAGNOSIS — Z23 Encounter for immunization: Secondary | ICD-10-CM | POA: Diagnosis not present

## 2020-10-30 DIAGNOSIS — N3281 Overactive bladder: Secondary | ICD-10-CM

## 2020-10-30 DIAGNOSIS — N1832 Chronic kidney disease, stage 3b: Secondary | ICD-10-CM

## 2020-10-30 DIAGNOSIS — I10 Essential (primary) hypertension: Secondary | ICD-10-CM | POA: Diagnosis not present

## 2020-10-30 DIAGNOSIS — D500 Iron deficiency anemia secondary to blood loss (chronic): Secondary | ICD-10-CM | POA: Diagnosis not present

## 2020-10-30 DIAGNOSIS — R350 Frequency of micturition: Secondary | ICD-10-CM

## 2020-10-30 DIAGNOSIS — D599 Acquired hemolytic anemia, unspecified: Secondary | ICD-10-CM | POA: Diagnosis not present

## 2020-10-30 LAB — CBC WITH DIFFERENTIAL/PLATELET
Basophils Absolute: 0 10*3/uL (ref 0.0–0.1)
Basophils Relative: 0.6 % (ref 0.0–3.0)
Eosinophils Absolute: 0.2 10*3/uL (ref 0.0–0.7)
Eosinophils Relative: 3.4 % (ref 0.0–5.0)
HCT: 26.7 % — ABNORMAL LOW (ref 36.0–46.0)
Hemoglobin: 9.5 g/dL — ABNORMAL LOW (ref 12.0–15.0)
Lymphocytes Relative: 13.8 % (ref 12.0–46.0)
Lymphs Abs: 1 10*3/uL (ref 0.7–4.0)
MCHC: 35.7 g/dL (ref 30.0–36.0)
MCV: 89.2 fl (ref 78.0–100.0)
Monocytes Absolute: 0.6 10*3/uL (ref 0.1–1.0)
Monocytes Relative: 7.9 % (ref 3.0–12.0)
Neutro Abs: 5.3 10*3/uL (ref 1.4–7.7)
Neutrophils Relative %: 74.3 % (ref 43.0–77.0)
Platelets: 183 10*3/uL (ref 150.0–400.0)
RBC: 2.99 Mil/uL — ABNORMAL LOW (ref 3.87–5.11)
RDW: 19.6 % — ABNORMAL HIGH (ref 11.5–15.5)
WBC: 7.1 10*3/uL (ref 4.0–10.5)

## 2020-10-30 LAB — URINALYSIS, ROUTINE W REFLEX MICROSCOPIC
Bilirubin Urine: NEGATIVE
Ketones, ur: NEGATIVE
Leukocytes,Ua: NEGATIVE
Nitrite: NEGATIVE
Specific Gravity, Urine: 1.02 (ref 1.000–1.030)
Total Protein, Urine: NEGATIVE
Urine Glucose: NEGATIVE
Urobilinogen, UA: 0.2 (ref 0.0–1.0)
pH: 6 (ref 5.0–8.0)

## 2020-10-30 LAB — BASIC METABOLIC PANEL
BUN: 27 mg/dL — ABNORMAL HIGH (ref 6–23)
CO2: 24 mEq/L (ref 19–32)
Calcium: 8.9 mg/dL (ref 8.4–10.5)
Chloride: 108 mEq/L (ref 96–112)
Creatinine, Ser: 1.16 mg/dL (ref 0.40–1.20)
GFR: 42.66 mL/min — ABNORMAL LOW (ref >60.00)
Glucose, Bld: 95 mg/dL (ref 70–99)
Potassium: 3.9 mEq/L (ref 3.5–5.1)
Sodium: 142 mEq/L (ref 135–145)

## 2020-10-30 LAB — IRON: Iron: 96 ug/dL (ref 42–145)

## 2020-10-30 LAB — FERRITIN: Ferritin: 690.6 ng/mL — ABNORMAL HIGH (ref 10.0–291.0)

## 2020-10-30 NOTE — Progress Notes (Signed)
Subjective:  Patient ID: Claudia Lara, female    DOB: May 08, 1932  Age: 86 y.o. MRN: [id number]  CC: Anemia and Hypertension  This visit occurred during the SARS-CoV-2 public health emergency.  Safety protocols were in place, including screening questions prior to the visit, additional usage of staff PPE, and extensive cleaning of exam room while observing appropriate contact time as indicated for disinfecting solutions.    HPI [name] presents for f/up - She reports a stable degree of shortness of breath.  She denies chest pain, diaphoresis, weakness, dizziness, lightheadedness, or edema.  Outpatient Medications Prior to Visit  Medication Sig Dispense Refill  . Calcium Carb-Cholecalciferol (CALCIUM CARBONATE-VITAMIN D3 PO) Take 1 tablet by mouth daily.     . carvedilol (COREG) 12.5 MG tablet TAKE 1 TABLET BY MOUTH 2 TIMES DAILY WITH A MEAL 180 tablet 1  . cholecalciferol (VITAMIN D) 1000 units tablet Take 1,000 Units by mouth daily.    . hydrochlorothiazide (MICROZIDE) 12.5 MG capsule Take 1 capsule (12.5 mg total) by mouth daily. 90 capsule 3  . Multiple Vitamin (MULTIVITAMIN WITH MINERALS) TABS tablet Take 1 tablet by mouth daily.    [name] torsemide (DEMADEX) 20 MG tablet Take 20 mg by mouth daily as needed.    . traMADol (ULTRAM) 50 MG tablet Take 1-2 tablets (50-100 mg total) by mouth every 12 (twelve) hours as needed for moderate pain. 56 tablet 0  . vitamin C (ASCORBIC ACID) 500 MG tablet Take 500 mg by mouth daily.     No facility-administered medications prior to visit.    ROS Review of Systems  Constitutional: Negative.  Negative for diaphoresis, fatigue and unexpected weight change.  HENT: Negative.   Eyes: Negative for visual disturbance.  Respiratory: Positive for shortness of breath. Negative for cough, chest tightness and wheezing.        Chronic,unchanged SOB. No DOE.  Cardiovascular: Negative for chest pain, palpitations and leg swelling.  Gastrointestinal:  Negative for abdominal pain, constipation, diarrhea, nausea and vomiting.  Endocrine: Positive for polyuria.  Genitourinary: Positive for frequency. Negative for decreased urine volume, difficulty urinating, dysuria, flank pain, hematuria and urgency.       +++nocturia  Musculoskeletal: Negative for arthralgias and myalgias.  Skin: Negative.  Negative for color change and pallor.  Neurological: Negative.  Negative for dizziness, weakness and light-headedness.  Hematological: Negative for adenopathy. Does not bruise/bleed easily.  Psychiatric/Behavioral: Negative.     Objective:  BP (!) 142/62   Pulse 74   Temp 97.9 F (36.6 C) (Oral)   Ht 5\' 5"  (1.651 m)   Wt 166 lb (75.3 kg)   SpO2 97%   BMI 27.62 kg/m   BP Readings from Last 3 Encounters:  10/30/20 (!) 142/62  10/25/20 (!) 126/53  09/21/20 126/60    Wt Readings from Last 3 Encounters:  10/30/20 166 lb (75.3 kg)  08/31/20 166 lb (75.3 kg)  08/01/20 166 lb (75.3 kg)    Physical Exam Vitals reviewed.  Constitutional:      Appearance: Normal appearance.  HENT:     Nose: Nose normal.     Mouth/Throat:     Mouth: Mucous membranes are moist.  Eyes:     General: No scleral icterus.    Conjunctiva/sclera: Conjunctivae normal.  Cardiovascular:     Rate and Rhythm: Normal rate and regular rhythm.     Heart sounds: Murmur heard.  Systolic murmur is present with a grade of 2/6.  No diastolic murmur is present.  No gallop.      Comments: 2/6 SEM RUSB Pulmonary:     Breath sounds: No stridor. No wheezing, rhonchi or rales.  Abdominal:     General: Abdomen is flat. Bowel sounds are normal. There is no distension.     Palpations: Abdomen is soft. There is no hepatomegaly, splenomegaly or mass.     Tenderness: There is no abdominal tenderness.  Musculoskeletal:     Cervical back: Neck supple.     Right lower leg: No edema.     Left lower leg: No edema.  Lymphadenopathy:     Cervical: No cervical adenopathy.  Skin:     General: Skin is warm and dry.  Neurological:     General: No focal deficit present.     Mental Status: She is alert.  Psychiatric:        Mood and Affect: Mood normal.     Lab Results  Component Value Date   WBC 7.1 10/30/2020   HGB 9.5 (L) 10/30/2020   HCT 26.7 Repeated and verified X2. (L) 10/30/2020   PLT 183.0 10/30/2020   GLUCOSE 95 10/30/2020   CHOL 108 05/17/2020   TRIG 69.0 05/17/2020   HDL 51.10 05/17/2020   LDLDIRECT 56.0 07/08/2018   LDLCALC 43 05/17/2020   ALT 13 05/17/2020   AST 17 05/17/2020   NA 142 10/30/2020   K 3.9 10/30/2020   CL 108 10/30/2020   CREATININE 1.16 10/30/2020   BUN 27 (H) 10/30/2020   CO2 24 10/30/2020   TSH 1.16 05/17/2020   INR 1.1 (H) 05/17/2020   HGBA1C 4.8 06/08/2019    No results found.  Assessment & Plan:   [place] was seen today for anemia and hypertension.  Diagnoses and all orders for this visit:  Flu vaccine need -     Flu Vaccine QUAD High Dose(Fluad)  Essential hypertension- Her blood pressure is adequately well controlled.  Electrolytes are normal and renal function is stable. -     Basic metabolic panel; Future -     Urinalysis, Routine w reflex microscopic; Future -     Urinalysis, Routine w reflex microscopic -     Basic metabolic panel  Stage 3b chronic kidney disease ([place])- Her renal function is stable.  Her blood pressure is adequately well controlled.  She will avoid nephrotoxic agents. -     Basic metabolic panel; Future -     Urinalysis, Routine w reflex microscopic; Future -     Urinalysis, Routine w reflex microscopic -     Basic metabolic panel  Acquired hemolytic anemia (HCC)- Her H&H are stable.  There is no evidence of active hemolysis. -     CBC with Differential/Platelet; Future -     CBC with Differential/Platelet  Iron deficiency anemia due to chronic blood loss- Her H&H are stable and her iron level is normal. -     CBC with Differential/Platelet; Future -     Ferritin; Future -      Iron; Future -     Iron -     Ferritin -     CBC with Differential/Platelet  Urinary frequency- Her urine is negative for infection or glucosuria.  I recommended that she start a medication for overactive bladder. -     Urinalysis, Routine w reflex microscopic; Future -     CULTURE, URINE COMPREHENSIVE; Future -     CULTURE, URINE COMPREHENSIVE -     Urinalysis, Routine w reflex microscopic  Need for prophylactic vaccination  and inoculation against influenza  OAB (overactive bladder) -     solifenacin (VESICARE) 5 MG tablet; Take 1 tablet (5 mg total) by mouth daily.   I am having Jennette Banker start on solifenacin. I am also having her maintain her vitamin C, cholecalciferol, Calcium Carb-Cholecalciferol (CALCIUM CARBONATE-VITAMIN D3 PO), multivitamin with minerals, traMADol, carvedilol, torsemide, and hydrochlorothiazide.  Meds ordered this encounter  Medications  . solifenacin (VESICARE) 5 MG tablet    Sig: Take 1 tablet (5 mg total) by mouth daily.    Dispense:  90 tablet    Refill:  1   I spent 50 minutes in preparing to see the patient by review of recent labs, imaging and procedures, obtaining and reviewing separately obtained history, communicating with the patient and family or caregiver, ordering medications, tests or procedures, and documenting clinical information in the EHR including the differential Dx, treatment, and any further evaluation and other management of  1. Essential hypertension 2. Stage 3b chronic kidney disease ([place]) 3. Acquired hemolytic anemia (HCC) 4. Iron deficiency anemia due to chronic blood loss 5. Urinary frequency 6. OAB (overactive bladder)     Follow-up: Return in about 6 months (around 04/30/2021).  [name], MD

## 2020-10-30 NOTE — Patient Instructions (Signed)

## 2020-11-01 DIAGNOSIS — Z23 Encounter for immunization: Secondary | ICD-10-CM | POA: Insufficient documentation

## 2020-11-01 DIAGNOSIS — N3281 Overactive bladder: Secondary | ICD-10-CM | POA: Insufficient documentation

## 2020-11-01 LAB — CULTURE, URINE COMPREHENSIVE

## 2020-11-01 MED ORDER — SOLIFENACIN SUCCINATE 5 MG PO TABS: 5.0000 mg | ORAL_TABLET | Freq: Every day | ORAL | 1 refills | Status: DC

## 2020-11-25 IMAGING — CR DG CHEST 2V
2 series · 2 of 2 positions shown · non-contrast
Comparison: CT 06/18/2019. Radiographs 11/13/2016. PET-CT
07/02/2019.

CLINICAL DATA: Incoherent since yesterday.  Fever.

EXAM:
CHEST - 2 VIEW

[w chest pa]
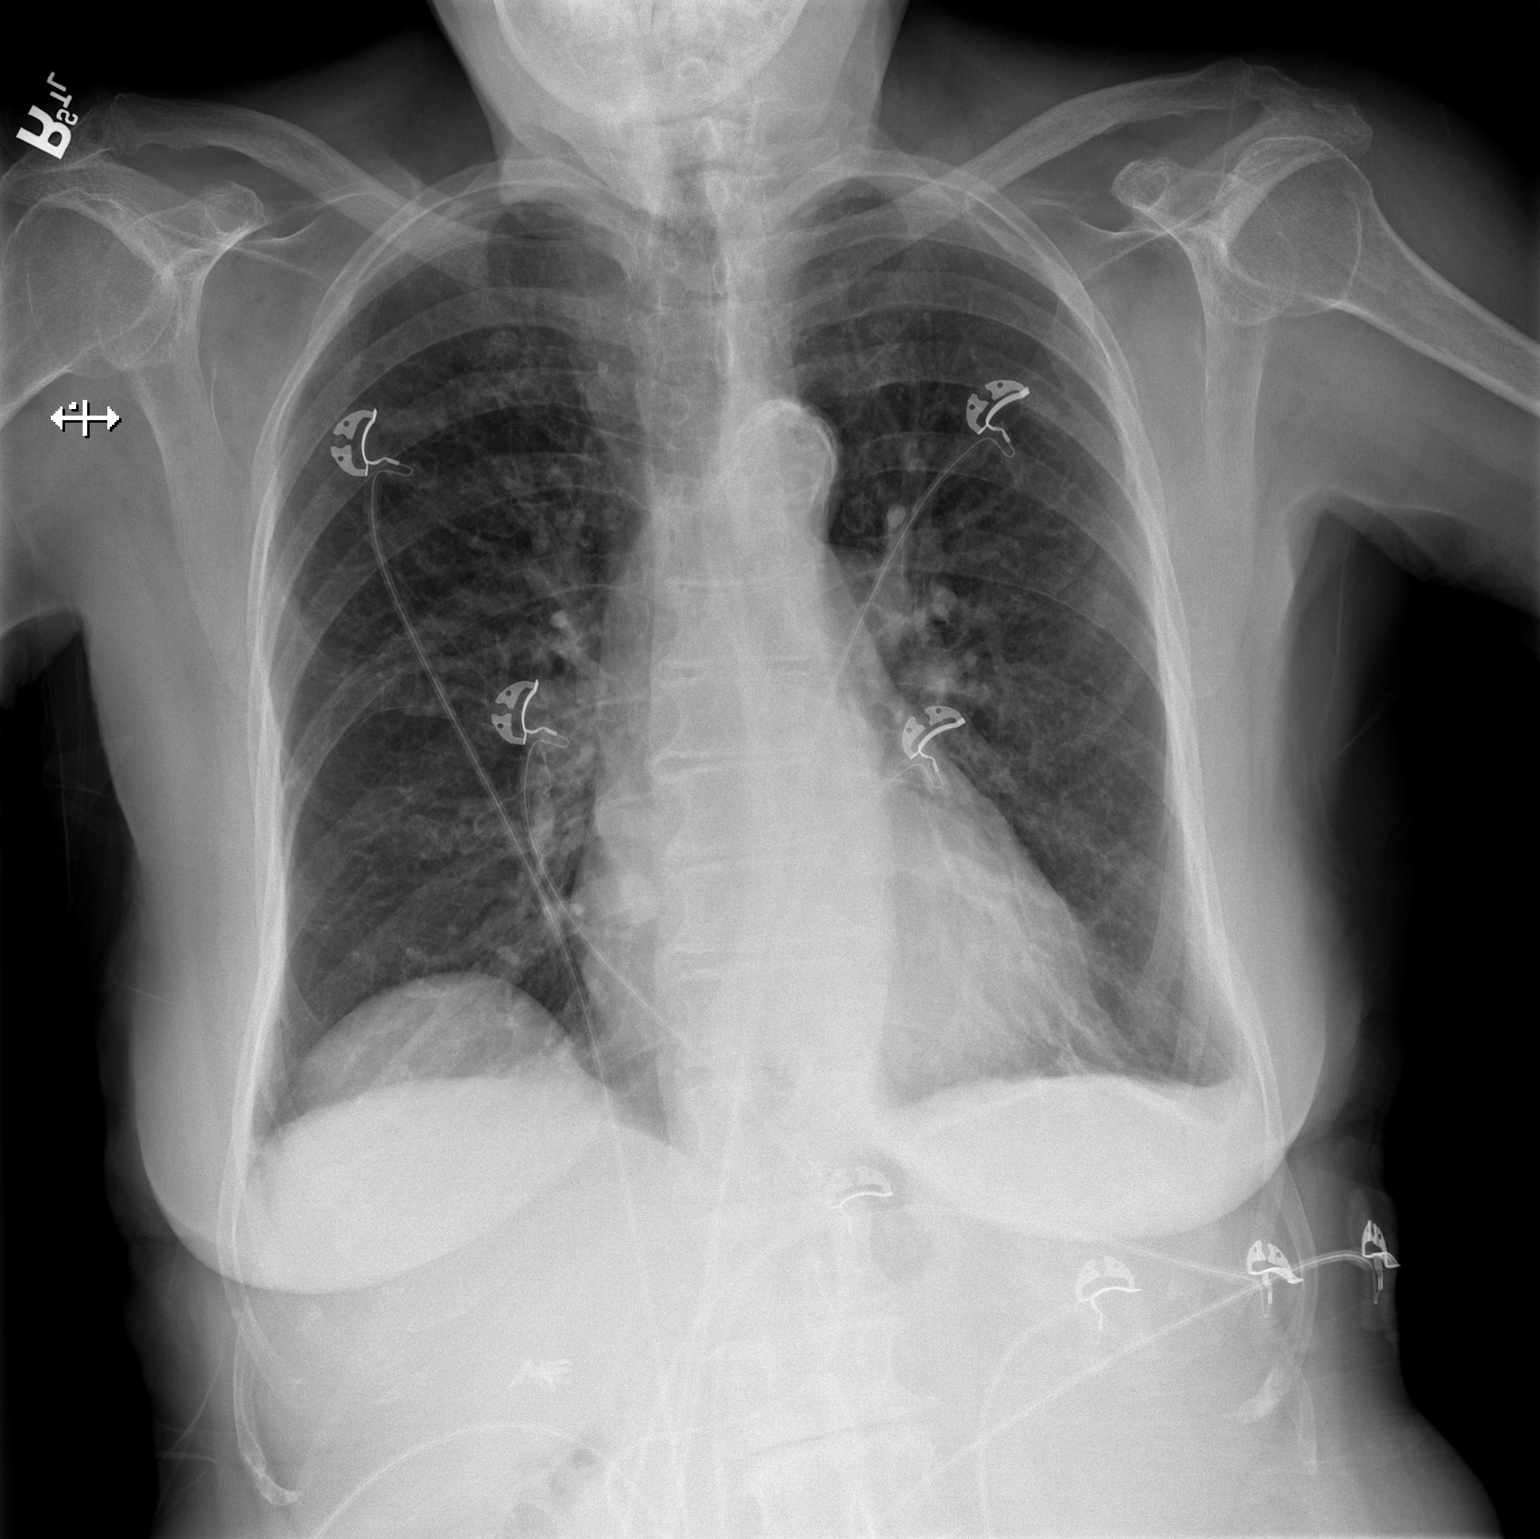

[w chest lat]
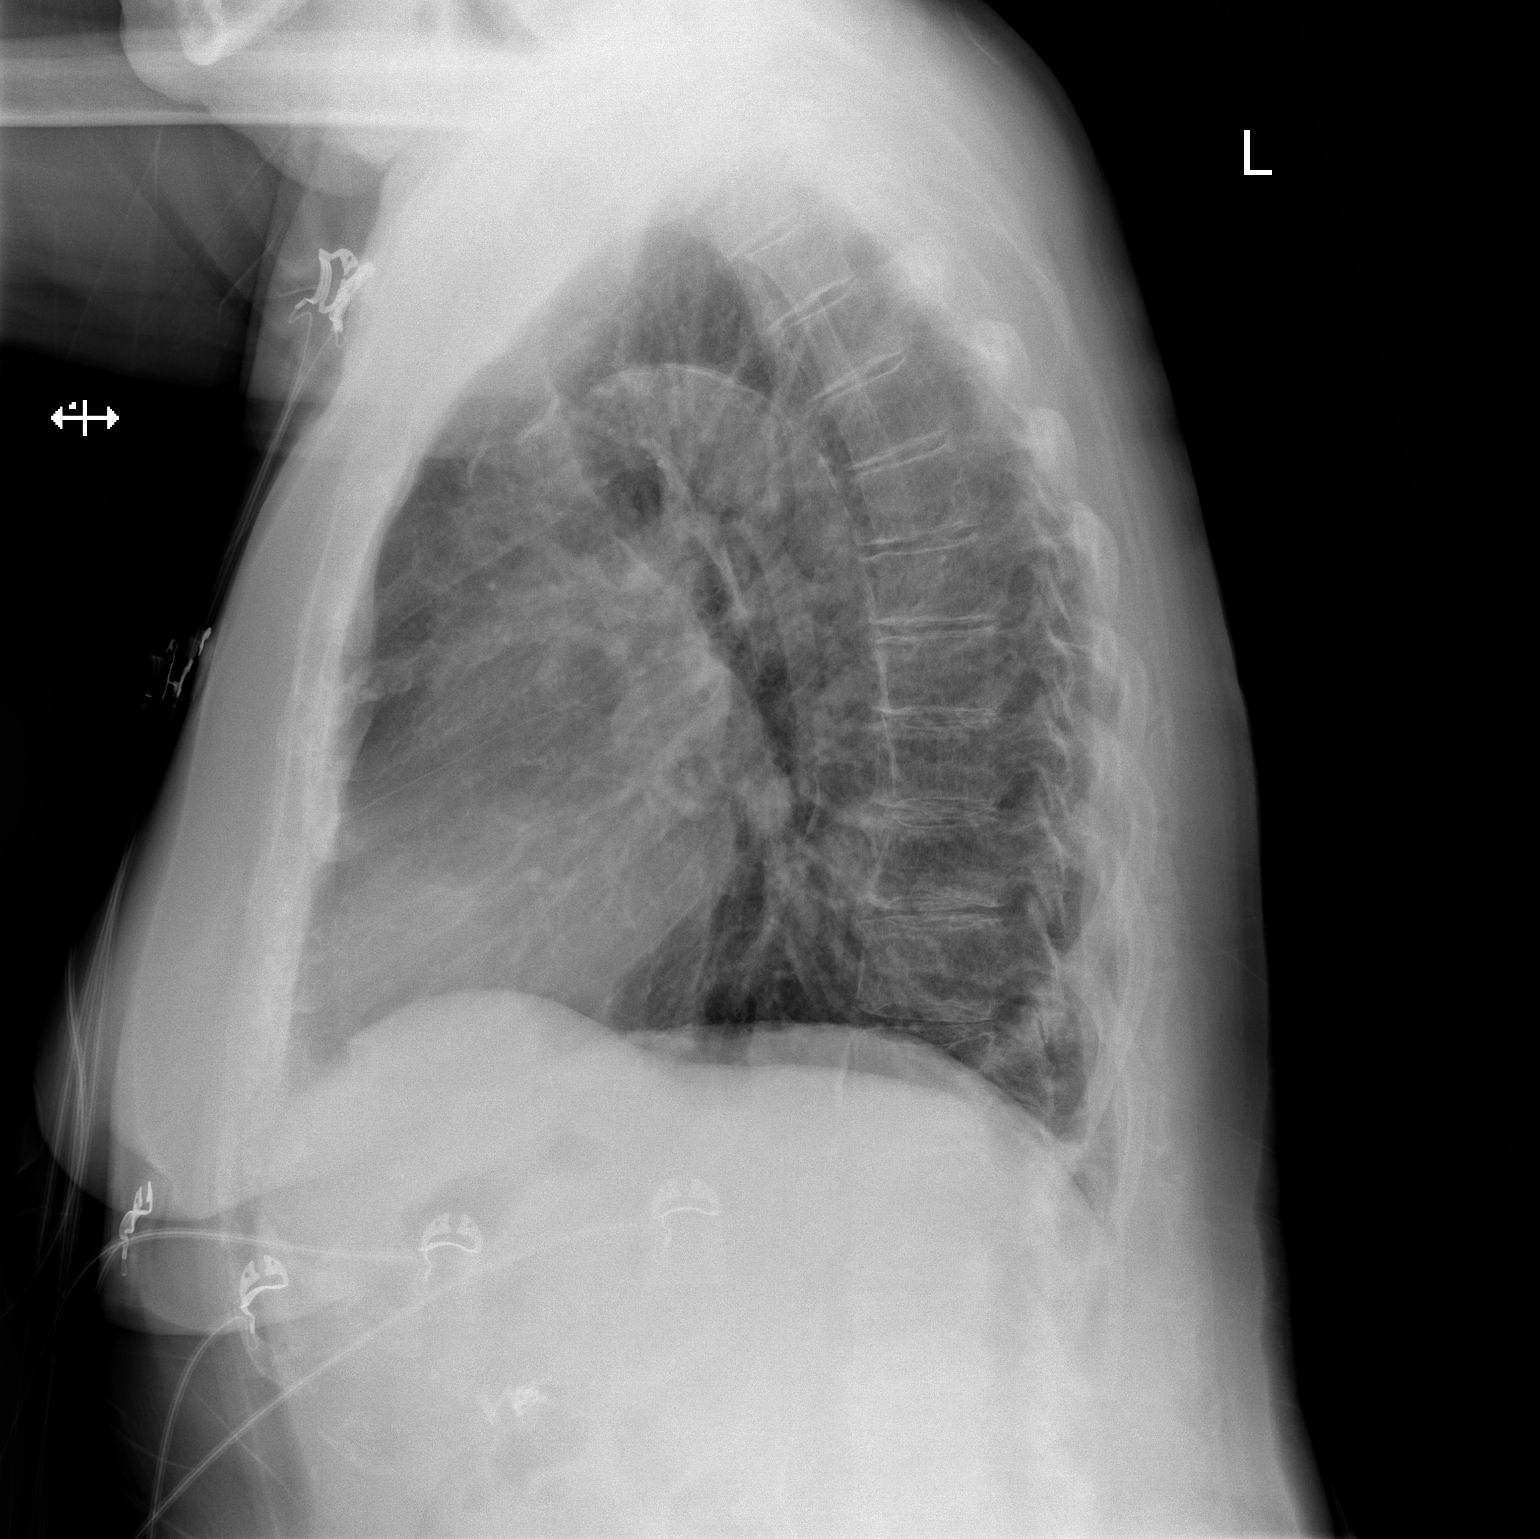

[2 of 2 positions shown; findings below may reference images not displayed]

FINDINGS: The heart size and mediastinal contours are stable. There is aortic
atherosclerosis. There is new mild blunting of the left costophrenic
angle laterally, suspicious for a small pleural effusion. There is
no right-sided pleural effusion, pneumothorax, confluent airspace
opacity or edema. Known small right upper lobe pulmonary nodule is
not well seen.
IMPRESSION: Possible small left pleural effusion. No other acute cardiopulmonary
process.

## 2021-01-11 DIAGNOSIS — Z961 Presence of intraocular lens: Secondary | ICD-10-CM | POA: Diagnosis not present

## 2021-01-11 DIAGNOSIS — H5213 Myopia, bilateral: Secondary | ICD-10-CM | POA: Diagnosis not present

## 2021-01-11 DIAGNOSIS — H524 Presbyopia: Secondary | ICD-10-CM | POA: Diagnosis not present

## 2021-01-11 DIAGNOSIS — H52203 Unspecified astigmatism, bilateral: Secondary | ICD-10-CM | POA: Diagnosis not present

## 2021-01-22 ENCOUNTER — Other Ambulatory Visit: Payer: Self-pay

## 2021-01-22 ENCOUNTER — Ambulatory Visit (INDEPENDENT_AMBULATORY_CARE_PROVIDER_SITE_OTHER): Payer: Medicare Other | Admitting: Internal Medicine

## 2021-01-22 ENCOUNTER — Encounter: Payer: Self-pay | Admitting: Internal Medicine

## 2021-01-22 VITALS — BP 148/82 | HR 93 | Temp 98.5°F | Ht 65.0 in | Wt 169.0 lb

## 2021-01-22 DIAGNOSIS — I5189 Other ill-defined heart diseases: Secondary | ICD-10-CM | POA: Diagnosis not present

## 2021-01-22 DIAGNOSIS — I10 Essential (primary) hypertension: Secondary | ICD-10-CM

## 2021-01-22 DIAGNOSIS — E2839 Other primary ovarian failure: Secondary | ICD-10-CM | POA: Insufficient documentation

## 2021-01-22 NOTE — Progress Notes (Signed)
Subjective:  Patient ID: Claudia Lara, female    DOB: Feb 12, 1932  Age: 87 y.o. MRN: [id number]  CC: Hypertension  This visit occurred during the SARS-CoV-2 public health emergency.  Safety protocols were in place, including screening questions prior to the visit, additional usage of staff PPE, and extensive cleaning of exam room while observing appropriate contact time as indicated for disinfecting solutions.    HPI Claudia Lara presents for f/up - She is very active and though she continues to experience DOE it is no worse than it was previously.  She denies chest pain, palpitations, dizziness, lightheadedness, edema, or fatigue.  Outpatient Medications Prior to Visit  Medication Sig Dispense Refill  . Calcium Carb-Cholecalciferol (CALCIUM CARBONATE-VITAMIN D3 PO) Take 1 tablet by mouth daily.     . carvedilol (COREG) 12.5 MG tablet TAKE 1 TABLET BY MOUTH 2 TIMES DAILY WITH A MEAL 180 tablet 1  . cholecalciferol (VITAMIN D) 1000 units tablet Take 1,000 Units by mouth daily.    . Multiple Vitamin (MULTIVITAMIN WITH MINERALS) TABS tablet Take 1 tablet by mouth daily.    . Multiple Vitamins-Minerals (ZINC PO) Take by mouth.    . solifenacin (VESICARE) 5 MG tablet Take 1 tablet (5 mg total) by mouth daily. 90 tablet 1  . torsemide (DEMADEX) 20 MG tablet Take 20 mg by mouth daily as needed.    . vitamin C (ASCORBIC ACID) 500 MG tablet Take 500 mg by mouth daily.    . hydrochlorothiazide (MICROZIDE) 12.5 MG capsule Take 1 capsule (12.5 mg total) by mouth daily. 90 capsule 3  . traMADol (ULTRAM) 50 MG tablet Take 1-2 tablets (50-100 mg total) by mouth every 12 (twelve) hours as needed for moderate pain. 56 tablet 0   No facility-administered medications prior to visit.    ROS Review of Systems  Constitutional: Negative for diaphoresis, fatigue and unexpected weight change.  HENT: Negative.   Respiratory: Positive for shortness of breath. Negative for cough, chest tightness, wheezing  and stridor.   Cardiovascular: Negative for chest pain, palpitations and leg swelling.  Gastrointestinal: Negative for abdominal pain, constipation, diarrhea, nausea and vomiting.  Genitourinary: Negative.  Negative for difficulty urinating.  Musculoskeletal: Positive for arthralgias.  Skin: Negative.  Negative for color change and pallor.  Neurological: Negative.  Negative for dizziness, weakness and light-headedness.  Hematological: Negative for adenopathy. Does not bruise/bleed easily.  Psychiatric/Behavioral: Negative.     Objective:  BP (!) 148/82   Pulse 93   Temp 98.5 F (36.9 C) (Oral)   Ht 5\' 5"  (1.651 m)   Wt 169 lb (76.7 kg)   SpO2 92%   BMI 28.12 kg/m   BP Readings from Last 3 Encounters:  01/22/21 (!) 148/82  10/30/20 (!) 142/62  10/25/20 (!) 126/53    Wt Readings from Last 3 Encounters:  01/22/21 169 lb (76.7 kg)  10/30/20 166 lb (75.3 kg)  08/31/20 166 lb (75.3 kg)    Physical Exam Vitals reviewed.  Constitutional:      Appearance: Normal appearance.  HENT:     Nose: Nose normal.     Mouth/Throat:     Mouth: Mucous membranes are moist.  Eyes:     General: No scleral icterus.    Conjunctiva/sclera: Conjunctivae normal.  Cardiovascular:     Rate and Rhythm: Normal rate and regular rhythm.     Heart sounds: Murmur heard.   Systolic murmur is present with a grade of 1/6.  No diastolic murmur is present.  Pulmonary:     Effort: Pulmonary effort is normal.     Breath sounds: No wheezing, rhonchi or rales.  Abdominal:     General: Abdomen is flat.     Palpations: There is no mass.     Tenderness: There is no abdominal tenderness. There is no guarding.  Musculoskeletal:     Cervical back: Neck supple.     Right lower leg: No edema.     Left lower leg: No edema.  Lymphadenopathy:     Cervical: No cervical adenopathy.  Skin:    General: Skin is warm and dry.     Coloration: Skin is not pale.  Neurological:     General: No focal deficit  present.     Mental Status: She is alert.     Lab Results  Component Value Date   WBC 7.1 10/30/2020   HGB 9.5 (L) 10/30/2020   HCT 26.7 Repeated and verified X2. (L) 10/30/2020   PLT 183.0 10/30/2020   GLUCOSE 95 10/30/2020   CHOL 108 05/17/2020   TRIG 69.0 05/17/2020   HDL 51.10 05/17/2020   LDLDIRECT 56.0 07/08/2018   LDLCALC 43 05/17/2020   ALT 13 05/17/2020   AST 17 05/17/2020   NA 142 10/30/2020   K 3.9 10/30/2020   CL 108 10/30/2020   CREATININE 1.16 10/30/2020   BUN 27 (H) 10/30/2020   CO2 24 10/30/2020   TSH 1.16 05/17/2020   INR 1.1 (H) 05/17/2020   HGBA1C 4.8 06/08/2019    No results found.  Assessment & Plan:   Claudia Lara was seen today for hypertension.  Diagnoses and all orders for this visit:  Estrogen deficiency -     DG Bone Density; Future  Essential hypertension- Her blood pressure is adequately well controlled.  Left ventricular diastolic dysfunction, NYHA class 1- Her fluid status is normal.  Will continue the combination of carvedilol and a loop diuretic.   I have discontinued Claudia Emory. Lara's traMADol and hydrochlorothiazide. I am also having her maintain her vitamin C, cholecalciferol, Calcium Carb-Cholecalciferol (CALCIUM CARBONATE-VITAMIN D3 PO), multivitamin with minerals, carvedilol, torsemide, solifenacin, and Multiple Vitamins-Minerals (ZINC PO).  No orders of the defined types were placed in this encounter.    Follow-up: Return in about 4 months (around 05/22/2021).  [name], MD

## 2021-01-22 NOTE — Patient Instructions (Signed)

## 2021-01-29 ENCOUNTER — Other Ambulatory Visit: Payer: Self-pay | Admitting: Internal Medicine

## 2021-01-29 DIAGNOSIS — E2839 Other primary ovarian failure: Secondary | ICD-10-CM

## 2021-02-05 ENCOUNTER — Telehealth: Payer: Self-pay | Admitting: Internal Medicine

## 2021-02-05 ENCOUNTER — Other Ambulatory Visit: Payer: Self-pay | Admitting: Internal Medicine

## 2021-02-05 DIAGNOSIS — R300 Dysuria: Secondary | ICD-10-CM

## 2021-02-05 NOTE — Telephone Encounter (Signed)
UA ordered

## 2021-02-05 NOTE — Telephone Encounter (Signed)
Pt has been informed and will come in the morning to do urine test.

## 2021-02-05 NOTE — Telephone Encounter (Signed)
° °  Patient calling to request order for urine test. Patient concerned that she may have UTI again

## 2021-02-06 ENCOUNTER — Other Ambulatory Visit: Payer: Self-pay | Admitting: Internal Medicine

## 2021-02-06 ENCOUNTER — Other Ambulatory Visit (INDEPENDENT_AMBULATORY_CARE_PROVIDER_SITE_OTHER): Payer: Medicare Other

## 2021-02-06 ENCOUNTER — Other Ambulatory Visit: Payer: Self-pay

## 2021-02-06 DIAGNOSIS — N3000 Acute cystitis without hematuria: Secondary | ICD-10-CM

## 2021-02-06 DIAGNOSIS — R300 Dysuria: Secondary | ICD-10-CM | POA: Diagnosis not present

## 2021-02-06 DIAGNOSIS — I5189 Other ill-defined heart diseases: Secondary | ICD-10-CM

## 2021-02-06 DIAGNOSIS — I10 Essential (primary) hypertension: Secondary | ICD-10-CM

## 2021-02-06 LAB — URINALYSIS, ROUTINE W REFLEX MICROSCOPIC
Bilirubin Urine: NEGATIVE
Ketones, ur: NEGATIVE
Nitrite: POSITIVE — AB
Specific Gravity, Urine: 1.015 (ref 1.000–1.030)
Urine Glucose: NEGATIVE
Urobilinogen, UA: 0.2 (ref 0.0–1.0)
pH: 7 (ref 5.0–8.0)

## 2021-02-06 MED ORDER — NITROFURANTOIN MONOHYD MACRO 100 MG PO CAPS: 100.0000 mg | ORAL_CAPSULE | Freq: Two times a day (BID) | ORAL | 0 refills | Status: AC

## 2021-02-07 NOTE — Telephone Encounter (Signed)
Please advise after you have reviewed results.

## 2021-02-07 NOTE — Telephone Encounter (Signed)
Pt has been informed of results and expressed understanding.  °

## 2021-02-07 NOTE — Telephone Encounter (Signed)
Please call patient with urine test reults

## 2021-02-07 NOTE — Telephone Encounter (Signed)
UA was abnormal Antibiotic RX was sent

## 2021-02-08 LAB — CULTURE, URINE COMPREHENSIVE

## 2021-02-14 DIAGNOSIS — X32XXXD Exposure to sunlight, subsequent encounter: Secondary | ICD-10-CM | POA: Diagnosis not present

## 2021-02-14 DIAGNOSIS — L570 Actinic keratosis: Secondary | ICD-10-CM | POA: Diagnosis not present

## 2021-02-14 DIAGNOSIS — L905 Scar conditions and fibrosis of skin: Secondary | ICD-10-CM | POA: Diagnosis not present

## 2021-02-14 DIAGNOSIS — D0472 Carcinoma in situ of skin of left lower limb, including hip: Secondary | ICD-10-CM | POA: Diagnosis not present

## 2021-02-18 ENCOUNTER — Encounter: Payer: Self-pay | Admitting: Internal Medicine

## 2021-02-21 ENCOUNTER — Other Ambulatory Visit: Payer: Self-pay

## 2021-02-21 ENCOUNTER — Ambulatory Visit (INDEPENDENT_AMBULATORY_CARE_PROVIDER_SITE_OTHER): Payer: Medicare Other | Admitting: Internal Medicine

## 2021-02-21 ENCOUNTER — Encounter: Payer: Self-pay | Admitting: Internal Medicine

## 2021-02-21 VITALS — BP 160/60 | HR 93 | Temp 98.1°F | Resp 16 | Ht 65.0 in | Wt 170.0 lb

## 2021-02-21 DIAGNOSIS — I350 Nonrheumatic aortic (valve) stenosis: Secondary | ICD-10-CM

## 2021-02-21 DIAGNOSIS — D500 Iron deficiency anemia secondary to blood loss (chronic): Secondary | ICD-10-CM

## 2021-02-21 DIAGNOSIS — I10 Essential (primary) hypertension: Secondary | ICD-10-CM

## 2021-02-21 DIAGNOSIS — N1832 Chronic kidney disease, stage 3b: Secondary | ICD-10-CM | POA: Diagnosis not present

## 2021-02-21 DIAGNOSIS — D599 Acquired hemolytic anemia, unspecified: Secondary | ICD-10-CM | POA: Diagnosis not present

## 2021-02-21 DIAGNOSIS — R072 Precordial pain: Secondary | ICD-10-CM | POA: Diagnosis not present

## 2021-02-21 LAB — CBC WITH DIFFERENTIAL/PLATELET
Basophils Absolute: 0.1 10*3/uL (ref 0.0–0.1)
Basophils Relative: 0.7 % (ref 0.0–3.0)
Eosinophils Absolute: 0.2 10*3/uL (ref 0.0–0.7)
Eosinophils Relative: 2.2 % (ref 0.0–5.0)
HCT: 26.3 % — ABNORMAL LOW (ref 36.0–46.0)
Hemoglobin: 9.2 g/dL — ABNORMAL LOW (ref 12.0–15.0)
Lymphocytes Relative: 17.4 % (ref 12.0–46.0)
Lymphs Abs: 1.4 10*3/uL (ref 0.7–4.0)
MCHC: 35.1 g/dL (ref 30.0–36.0)
MCV: 89.1 fl (ref 78.0–100.0)
Monocytes Absolute: 0.8 10*3/uL (ref 0.1–1.0)
Monocytes Relative: 9.1 % (ref 3.0–12.0)
Neutro Abs: 5.8 10*3/uL (ref 1.4–7.7)
Neutrophils Relative %: 70.6 % (ref 43.0–77.0)
Platelets: 216 10*3/uL (ref 150.0–400.0)
RBC: 2.95 Mil/uL — ABNORMAL LOW (ref 3.87–5.11)
RDW: 20.8 % — ABNORMAL HIGH (ref 11.5–15.5)
WBC: 8.3 10*3/uL (ref 4.0–10.5)

## 2021-02-21 LAB — BASIC METABOLIC PANEL
BUN: 28 mg/dL — ABNORMAL HIGH (ref 6–23)
CO2: 26 mEq/L (ref 19–32)
Calcium: 9.1 mg/dL (ref 8.4–10.5)
Chloride: 107 mEq/L (ref 96–112)
Creatinine, Ser: 1.2 mg/dL (ref 0.40–1.20)
GFR: 40.87 mL/min — ABNORMAL LOW (ref >60.00)
Glucose, Bld: 107 mg/dL — ABNORMAL HIGH (ref 70–99)
Potassium: 4.9 mEq/L (ref 3.5–5.1)
Sodium: 141 mEq/L (ref 135–145)

## 2021-02-21 LAB — TROPONIN I (HIGH SENSITIVITY): High Sens Troponin I: 6 ng/L (ref 2–17)

## 2021-02-21 LAB — FERRITIN: Ferritin: 1170 ng/mL — ABNORMAL HIGH (ref 10.0–291.0)

## 2021-02-21 LAB — IRON: Iron: 70 ug/dL (ref 42–145)

## 2021-02-21 LAB — BRAIN NATRIURETIC PEPTIDE: Pro B Natriuretic peptide (BNP): 265 pg/mL — ABNORMAL HIGH (ref 0.0–100.0)

## 2021-02-21 NOTE — Progress Notes (Addendum)
Subjective:  Patient ID: Claudia Lara, female    DOB: 01/22/1932  Age: 87 y.o. MRN: [id number]  CC: Hypertension  This visit occurred during the SARS-CoV-2 public health emergency.  Safety protocols were in place, including screening questions prior to the visit, additional usage of staff PPE, and extensive cleaning of exam room while observing appropriate contact time as indicated for disinfecting solutions.    HPI Claudia Lara presents for f/up -she complains of an episode of chest pain about 4 days ago.  She said she was standing in her kitchen when the acute onset of substernal chest discomfort occurred with fatigue and sensation of feeling clammy.  She cannot describe the pain in any specific details.  She did not experience shortness of breath, dizziness, lightheadedness, or near syncope.  Outpatient Medications Prior to Visit  Medication Sig Dispense Refill  . Calcium Carb-Cholecalciferol (CALCIUM CARBONATE-VITAMIN D3 PO) Take 1 tablet by mouth daily.     . carvedilol (COREG) 12.5 MG tablet TAKE 1 TABLET BY MOUTH 2 TIMES DAILY WITH A MEAL 180 tablet 1  . cholecalciferol (VITAMIN D) 1000 units tablet Take 1,000 Units by mouth daily.    . Multiple Vitamin (MULTIVITAMIN WITH MINERALS) TABS tablet Take 1 tablet by mouth daily.    . Multiple Vitamins-Minerals (ZINC PO) Take by mouth.    . solifenacin (VESICARE) 5 MG tablet Take 1 tablet (5 mg total) by mouth daily. 90 tablet 1  . torsemide (DEMADEX) 20 MG tablet Take 20 mg by mouth daily as needed.    . vitamin C (ASCORBIC ACID) 500 MG tablet Take 500 mg by mouth daily.     No facility-administered medications prior to visit.    ROS Review of Systems  Constitutional: Positive for diaphoresis and fatigue. Negative for appetite change, chills, fever and unexpected weight change.  HENT: Negative.   Respiratory: Negative for cough, chest tightness, shortness of breath and wheezing.   Cardiovascular: Positive for chest pain. Negative  for palpitations and leg swelling.  Gastrointestinal: Negative for abdominal pain, blood in stool, constipation, diarrhea, nausea and vomiting.  Endocrine: Negative.   Genitourinary: Negative.  Negative for difficulty urinating.  Musculoskeletal: Negative.  Negative for arthralgias.  Skin: Negative.  Negative for color change and rash.  Neurological: Negative.  Negative for dizziness, weakness and light-headedness.  Hematological: Negative for adenopathy. Does not bruise/bleed easily.  Psychiatric/Behavioral: Negative.     Objective:  BP (!) 160/60 (BP Location: Right Arm, Patient Position: Sitting, Cuff Size: Large)   Pulse 93   Temp 98.1 F (36.7 C) (Oral)   Resp 16   Ht 5\' 5"  (1.651 m)   Wt 170 lb (77.1 kg)   SpO2 93%   BMI 28.29 kg/m   BP Readings from Last 3 Encounters:  02/21/21 (!) 160/60  01/22/21 (!) 148/82  10/30/20 (!) 142/62    Wt Readings from Last 3 Encounters:  02/21/21 170 lb (77.1 kg)  01/22/21 169 lb (76.7 kg)  10/30/20 166 lb (75.3 kg)    Physical Exam Vitals reviewed.  Constitutional:      Appearance: She is obese.  HENT:     Nose: Nose normal.     Mouth/Throat:     Mouth: Mucous membranes are moist.  Eyes:     General: Scleral icterus (very mild) present.     Conjunctiva/sclera: Conjunctivae normal.  Cardiovascular:     Rate and Rhythm: Normal rate and regular rhythm.     Heart sounds: Murmur heard.   Systolic  murmur is present with a grade of 2/6.  No diastolic murmur is present. No friction rub. No gallop.      Comments: EKG- NSR, 87 bpm Normal EKG Pulmonary:     Effort: Pulmonary effort is normal.     Breath sounds: No stridor. No wheezing, rhonchi or rales.  Abdominal:     General: Abdomen is flat. Bowel sounds are normal. There is no distension.     Palpations: Abdomen is soft. There is no hepatomegaly, splenomegaly or mass.     Tenderness: There is no abdominal tenderness.  Musculoskeletal:     Cervical back: Neck supple.      Right lower leg: No edema.     Left lower leg: No edema.  Lymphadenopathy:     Cervical: No cervical adenopathy.  Skin:    General: Skin is warm and dry.     Coloration: Skin is jaundiced. Skin is not pale.     Findings: No rash.  Neurological:     General: No focal deficit present.     Mental Status: She is alert.  Psychiatric:        Mood and Affect: Mood normal.        Behavior: Behavior normal.     Lab Results  Component Value Date   WBC 8.3 02/21/2021   HGB 9.2 (L) 02/21/2021   HCT 26.3 Repeated and verified X2. (L) 02/21/2021   PLT 216.0 02/21/2021   GLUCOSE 107 (H) 02/21/2021   CHOL 108 05/17/2020   TRIG 69.0 05/17/2020   HDL 51.10 05/17/2020   LDLDIRECT 56.0 07/08/2018   LDLCALC 43 05/17/2020   ALT 13 05/17/2020   AST 17 05/17/2020   NA 141 02/21/2021   K 4.9 02/21/2021   CL 107 02/21/2021   CREATININE 1.20 02/21/2021   BUN 28 (H) 02/21/2021   CO2 26 02/21/2021   TSH 1.16 05/17/2020   INR 1.1 (H) 05/17/2020   HGBA1C 4.8 06/08/2019    Myocardial Perfusion Imaging: Result Notes  Older Notes     Notes Recorded by Earvin Hansen on 07/07/2013 at 6:25 PM Patient advised ------  Notes Recorded by Darlin Coco, MD on 07/02/2013 at 9:08 AM Please report. The stress test was normal. No evidence of ischemia or blockage or scar. Normal ejection fraction.     Assessment & Plan:   Shaleah was seen today for hypertension.  Diagnoses and all orders for this visit:  Precordial pain- Her symptoms are suspicious for ischemia but her EKG is normal and her troponin is negative.  She had a normal thallium scan 8 years ago - I recommend we repeat that at this time.   -     Troponin I (High Sensitivity); Future -     Brain natriuretic peptide; Future -     Brain natriuretic peptide -     Troponin I (High Sensitivity)  Essential hypertension- Her blood pressure is adequately well controlled. -     CBC with Differential/Platelet; Future -     Basic metabolic  panel; Future -     EKG 12-Lead -     Basic metabolic panel -     CBC with Differential/Platelet  Stage 3b chronic kidney disease ([place])- Her renal function is stable. -     CBC with Differential/Platelet; Future -     Basic metabolic panel; Future -     Basic metabolic panel -     CBC with Differential/Platelet  Acquired hemolytic anemia ([place])- Her H&H are stable.  There is minimal evidence of active hemolysis.  Will continue to follow. -     CBC with Differential/Platelet; Future -     Haptoglobin; Future -     Haptoglobin -     CBC with Differential/Platelet  Iron deficiency anemia due to chronic blood loss- Her H&H are stable.  Her iron level is normal.  Her ferritin remains elevated from the inflammatory process. -     CBC with Differential/Platelet; Future -     Iron; Future -     Ferritin; Future -     Ferritin -     Iron -     CBC with Differential/Platelet   I am having Jennette Banker maintain her vitamin C, cholecalciferol, Calcium Carb-Cholecalciferol (CALCIUM CARBONATE-VITAMIN D3 PO), multivitamin with minerals, torsemide, solifenacin, Multiple Vitamins-Minerals (ZINC PO), and carvedilol.  No orders of the defined types were placed in this encounter.  I spent 55 minutes in preparing to see the patient by review of recent labs, imaging and procedures, obtaining and reviewing separately obtained history, communicating with the patient, ordering medications, tests or procedures, and documenting clinical information in the EHR including the differential Dx, treatment, and any further evaluation and other management of 1. Precordial pain 2. Essential hypertension 3. Stage 3b chronic kidney disease ([place]) 4. Acquired hemolytic anemia (H[facility]) 5. Iron deficiency anemia due to chronic blood loss    Follow-up: Return in about 4 weeks (around 03/21/2021).  [name], MD

## 2021-02-21 NOTE — Patient Instructions (Signed)

## 2021-02-22 LAB — HAPTOGLOBIN: Haptoglobin: 63 mg/dL (ref 43–212)

## 2021-02-24 ENCOUNTER — Encounter: Payer: Self-pay | Admitting: Internal Medicine

## 2021-03-01 ENCOUNTER — Encounter: Payer: Self-pay | Admitting: Internal Medicine

## 2021-03-04 NOTE — Addendum Note (Signed)
Addended by: Janith Lima on: 03/04/2021 10:48 AM   Modules accepted: Orders

## 2021-03-06 ENCOUNTER — Telehealth (HOSPITAL_COMMUNITY): Payer: Self-pay | Admitting: *Deleted

## 2021-03-06 NOTE — Telephone Encounter (Signed)
Close encounter 

## 2021-03-07 ENCOUNTER — Other Ambulatory Visit: Payer: Self-pay

## 2021-03-07 ENCOUNTER — Ambulatory Visit (HOSPITAL_COMMUNITY)
Admission: RE | Admit: 2021-03-07 | Discharge: 2021-03-07 | Disposition: A | Discharge status: Home / Self Care | Payer: Medicare Other | Source: Ambulatory Visit | Attending: Cardiology | Admitting: Cardiology

## 2021-03-07 DIAGNOSIS — R072 Precordial pain: Secondary | ICD-10-CM | POA: Diagnosis not present

## 2021-03-07 LAB — MYOCARDIAL PERFUSION IMAGING
LV dias vol: 99 mL (ref 46–106)
LV sys vol: 35 mL
Peak HR: 94 {beats}/min
Rest HR: 59 {beats}/min
SDS: 0
SRS: 1
SSS: 1
TID: 1.03

## 2021-03-07 MED ORDER — TECHNETIUM TC 99M TETROFOSMIN IV KIT
29.7000 | PACK | Freq: Once | INTRAVENOUS | Status: AC | PRN
  Administered 2021-03-07: 29.7 via INTRAVENOUS
  Filled 2021-03-07: qty 30

## 2021-03-07 MED ORDER — REGADENOSON 0.4 MG/5ML IV SOLN
0.4000 mg | Freq: Once | INTRAVENOUS | Status: AC
  Administered 2021-03-07: 0.4 mg via INTRAVENOUS

## 2021-03-07 MED ORDER — TECHNETIUM TC 99M TETROFOSMIN IV KIT
10.4000 | PACK | Freq: Once | INTRAVENOUS | Status: AC | PRN
  Administered 2021-03-07: 10.4 via INTRAVENOUS
  Filled 2021-03-07: qty 11

## 2021-03-09 ENCOUNTER — Encounter: Payer: Self-pay | Admitting: Internal Medicine

## 2021-04-18 DIAGNOSIS — Z08 Encounter for follow-up examination after completed treatment for malignant neoplasm: Secondary | ICD-10-CM | POA: Diagnosis not present

## 2021-04-18 DIAGNOSIS — Z85828 Personal history of other malignant neoplasm of skin: Secondary | ICD-10-CM | POA: Diagnosis not present

## 2021-04-24 ENCOUNTER — Encounter: Payer: Self-pay | Admitting: Internal Medicine

## 2021-04-24 NOTE — Telephone Encounter (Signed)
Called pt, but no answer. Sent pt a mychart message to advise pt to come to her scheduled OV tomorrow concerning her blood pressure.

## 2021-04-25 ENCOUNTER — Encounter: Payer: Self-pay | Admitting: Internal Medicine

## 2021-04-25 ENCOUNTER — Ambulatory Visit (INDEPENDENT_AMBULATORY_CARE_PROVIDER_SITE_OTHER): Payer: Medicare Other | Admitting: Internal Medicine

## 2021-04-25 ENCOUNTER — Other Ambulatory Visit: Payer: Self-pay

## 2021-04-25 VITALS — BP 132/50 | HR 78 | Temp 99.2°F | Ht 65.0 in | Wt 167.0 lb

## 2021-04-25 DIAGNOSIS — E785 Hyperlipidemia, unspecified: Secondary | ICD-10-CM

## 2021-04-25 DIAGNOSIS — N1832 Chronic kidney disease, stage 3b: Secondary | ICD-10-CM | POA: Diagnosis not present

## 2021-04-25 DIAGNOSIS — I10 Essential (primary) hypertension: Secondary | ICD-10-CM | POA: Diagnosis not present

## 2021-04-25 NOTE — Patient Instructions (Addendum)
Ok to restart the coreg at 12.5 mg twice per day as before, since your most recent blood pressures have been OK  Please continue to monitor your BP at home, and call in 1 -2 wks if the top number seems most often more than 140   Please continue all other medications as before, and refills have been done if requested.  Please have the pharmacy call with any other refills you may need.  Please continue your efforts at being more active, low cholesterol diet, and weight control.  Please keep your appointments with your specialists as you may have planned   Please follow up with Dr Ronnald Ramp in July 2022 as you have planned

## 2021-04-25 NOTE — Progress Notes (Signed)
Patient ID: Claudia Lara, female   DOB: 1932/06/16, 87 y.o.   MRN: [id number]        Chief Complaint: follow up HTN, HLD and CKD with PCP out of office today       HPI:  Claudia Lara is a 87 y.o. female here with above, Pt denies chest pain, increased sob or doe, wheezing, orthopnea, PND, increased LE swelling, palpitations, dizziness or syncope.   Pt denies polydipsia, polyuria, or new focal neuro s/s.   Has been out of BP med for over 1 wk but wanting to restart.   Pt denies fever, wt loss, night sweats, loss of appetite, or other constitutional symptoms         Wt Readings from Last 3 Encounters:  04/25/21 167 lb (75.8 kg)  03/07/21 170 lb (77.1 kg)  02/21/21 170 lb (77.1 kg)   BP Readings from Last 3 Encounters:  04/25/21 (!) 132/50  02/21/21 (!) 160/60  01/22/21 (!) 148/82         Past Medical History:  Diagnosis Date  . ANEMIA 08/22/2009  . Aortic stenosis   . AR (aortic regurgitation) 01/2019   Mild to Moderate, Noted on ECHO  . CAROTID ARTERY STENOSIS 01/16/2010  . Complication of anesthesia    very hard to wake up from surgery  . DYSPNEA ON EXERTION 11/16/2007  . Gallstones   . GEN OSTEOARTHROSIS INVOLVING MULTIPLE SITES 11/11/2008  . GERD (gastroesophageal reflux disease)   . Grade I diastolic dysfunction 37/8588   Noted on ECHO  . Heart murmur   . HEMORRHOIDS, INTERNAL    surgery was in the 90's (per patient)  . History of blood transfusion    after hip and knee replacements  . NECK PAIN, ACUTE 12/28/2009  . PERIPHERAL EDEMA 10/06/2007  . PONV (postoperative nausea and vomiting)   . Pulmonary nodule 06/18/2019   noted on CT Chest   . PVD 10/06/2007  . Unspecified essential hypertension 10/19/2007   Past Surgical History:  Procedure Laterality Date  . ABDOMINAL HYSTERECTOMY    . BIOPSY  08/06/2019   Procedure: BIOPSY;  Surgeon: Gatha Mayer, MD;  Location: [facility];  Service: Gastroenterology;;  . [name]     bilat  . CARPAL TUNNEL RELEASE     . CHOLECYSTECTOMY  01/29/2016   at [facility]  . CHOLECYSTECTOMY    . ENDOSCOPIC RETROGRADE CHOLANGIOPANCREATOGRAPHY (ERCP) WITH PROPOFOL N/A 12/01/2015   Procedure: ENDOSCOPIC RETROGRADE CHOLANGIOPANCREATOGRAPHY (ERCP) WITH PROPOFOL;  Surgeon: [name], MD;  Location: [facility];  Service: Endoscopy;  Laterality: N/A;  . ENDOSCOPIC RETROGRADE CHOLANGIOPANCREATOGRAPHY (ERCP) WITH PROPOFOL N/A 02/01/2016   Procedure: ENDOSCOPIC RETROGRADE CHOLANGIOPANCREATOGRAPHY (ERCP) WITH PROPOFOL;  Surgeon: [name], MD;  Location: [facility];  Service: Endoscopy;  Laterality: N/A;  . ENDOSCOPIC RETROGRADE CHOLANGIOPANCREATOGRAPHY (ERCP) WITH PROPOFOL N/A 04/07/2018   Procedure: ENDOSCOPIC RETROGRADE CHOLANGIOPANCREATOGRAPHY (ERCP) WITH PROPOFOL;  Surgeon: [name], MD;  Location: [facility];  Service: Endoscopy;  Laterality: N/A;  . ESOPHAGOGASTRODUODENOSCOPY (EGD) WITH PROPOFOL N/A 08/06/2019   Procedure: ESOPHAGOGASTRODUODENOSCOPY (EGD) WITH PROPOFOL;  Surgeon: Gatha Mayer, MD;  Location: [facility];  Service: Gastroenterology;  Laterality: N/A;  . HAMMER TOE SURGERY    . HEMORRHOID SURGERY    . Hyperplastic colon polyps, removed  2007   By Dr. Penelope Coop  . JOINT REPLACEMENT  2011   right knee  . OOPHORECTOMY    . REMOVAL OF STONES  04/07/2018   Procedure: REMOVAL OF STONES;  Surgeon: Ardis Hughs,  Melene Plan, MD;  Location: [facility];  Service: Endoscopy;;  . ROTATOR CUFF REPAIR  2004   left (Dr. Durward Fortes)  . SPHINCTEROTOMY  04/07/2018   Procedure: SPHINCTEROTOMY;  Surgeon: [name], MD;  Location: [facility];  Service: Endoscopy;;  . TOTAL HIP ARTHROPLASTY Right 11/26/2016   Procedure: RIGHT TOTAL HIP ARTHROPLASTY;  Surgeon: Garald Balding, MD;  Location: [facility];  Service: Orthopedics;  Laterality: Right;  . TOTAL KNEE ARTHROPLASTY Right   . TOTAL KNEE ARTHROPLASTY Left 07/26/2019   Procedure: TOTAL KNEE ARTHROPLASTY;  Surgeon: Gaynelle Arabian, MD;   Location: [facility];  Service: Orthopedics;  Laterality: Left;  79min    reports that she has never smoked. She has never used smokeless tobacco. She reports that she does not drink alcohol and does not use drugs. family history includes Cancer in her brother, father, and mother; Leukemia in her father; Lung cancer in her father; Ovarian cancer in her mother. Allergies  Allergen Reactions  . Sulfonamide Derivatives Rash    Childhood reaction   Current Outpatient Medications on File Prior to Visit  Medication Sig Dispense Refill  . Calcium Carb-Cholecalciferol (CALCIUM CARBONATE-VITAMIN D3 PO) Take 1 tablet by mouth daily.     . carvedilol (COREG) 12.5 MG tablet TAKE 1 TABLET BY MOUTH 2 TIMES DAILY WITH A MEAL 180 tablet 1  . cholecalciferol (VITAMIN D) 1000 units tablet Take 1,000 Units by mouth daily.    . Multiple Vitamin (MULTIVITAMIN WITH MINERALS) TABS tablet Take 1 tablet by mouth daily.    . Multiple Vitamins-Minerals (ZINC PO) Take by mouth.    . torsemide (DEMADEX) 20 MG tablet Take 20 mg by mouth daily as needed.    . traMADol (ULTRAM) 50 MG tablet Take 50 mg by mouth as needed.    . vitamin C (ASCORBIC ACID) 500 MG tablet Take 500 mg by mouth daily.     No current facility-administered medications on file prior to visit.        ROS:  All others reviewed and negative.  Objective        PE:  BP (!) 132/50 (BP Location: Right Arm, Patient Position: Sitting, Cuff Size: Large)   Pulse 78   Temp 99.2 F (37.3 C) (Oral)   Ht 5\' 5"  (1.651 m)   Wt 167 lb (75.8 kg)   SpO2 96%   BMI 27.79 kg/m                 Constitutional: Pt appears in NAD               HENT: Head: NCAT.                Right Ear: External ear normal.                 Left Ear: External ear normal.                Eyes: . Pupils are equal, round, and reactive to light. Conjunctivae and EOM are normal               Nose: without d/c or deformity               Neck: Neck supple. Gross normal ROM                Cardiovascular: Normal rate and regular rhythm.                 Pulmonary/Chest: Effort normal and breath sounds  without rales or wheezing.                Abd:  Soft, NT, ND, + BS, no organomegaly               Neurological: Pt is alert. At baseline orientation, motor grossly intact               Skin: Skin is warm. No rashes, no other new lesions, LE edema - none               Psychiatric: Pt behavior is normal without agitation   Micro: none  Cardiac tracings I have personally interpreted today:  none  Pertinent Radiological findings (summarize): none   Lab Results  Component Value Date   WBC 8.3 02/21/2021   HGB 9.2 (L) 02/21/2021   HCT 26.3 Repeated and verified X2. (L) 02/21/2021   PLT 216.0 02/21/2021   GLUCOSE 107 (H) 02/21/2021   CHOL 108 05/17/2020   TRIG 69.0 05/17/2020   HDL 51.10 05/17/2020   LDLDIRECT 56.0 07/08/2018   LDLCALC 43 05/17/2020   ALT 13 05/17/2020   AST 17 05/17/2020   NA 141 02/21/2021   K 4.9 02/21/2021   CL 107 02/21/2021   CREATININE 1.20 02/21/2021   BUN 28 (H) 02/21/2021   CO2 26 02/21/2021   TSH 1.16 05/17/2020   INR 1.1 (H) 05/17/2020   HGBA1C 4.8 06/08/2019   Assessment/Plan:  Claudia Lara is a 87 y.o. White or Caucasian [1] female with  has a past medical history of ANEMIA (08/22/2009), Aortic stenosis, AR (aortic regurgitation) (01/2019), CAROTID ARTERY STENOSIS (4/65/6812), Complication of anesthesia, DYSPNEA ON EXERTION (11/16/2007), Gallstones, GEN OSTEOARTHROSIS INVOLVING MULTIPLE SITES (11/11/2008), GERD (gastroesophageal reflux disease), Grade I diastolic dysfunction (75/1700), Heart murmur, HEMORRHOIDS, INTERNAL, History of blood transfusion, NECK PAIN, ACUTE (12/28/2009), PERIPHERAL EDEMA (10/06/2007), PONV (postoperative nausea and vomiting), Pulmonary nodule (06/18/2019), PVD (10/06/2007), and Unspecified essential hypertension (10/19/2007).  Essential hypertension Mild uncontrolled, for med restart coreg 12.5 bid,,  to f/u any  worsening symptoms or concerns  Kidney disease, chronic, stage III (GFR 30-59 ml/min) (HCC) Lab Results  Component Value Date   CREATININE 1.20 02/21/2021   Stable overall, cont to avoid nephrotoxins  Hyperlipidemia with target LDL less than 130 Lab Results  Component Value Date   LDLCALC 43 05/17/2020   Stable, pt to continue current low chol diet  Followup: Return if symptoms worsen or fail to improve.  Cathlean Cower, MD 04/28/2021 8:26 PM [facility]

## 2021-04-28 ENCOUNTER — Encounter: Payer: Self-pay | Admitting: Internal Medicine

## 2021-04-28 NOTE — Assessment & Plan Note (Addendum)
Mild uncontrolled, for med restart coreg 12.5 bid,,  to f/u any worsening symptoms or concerns

## 2021-04-28 NOTE — Assessment & Plan Note (Signed)
Lab Results  Component Value Date   LDLCALC 43 05/17/2020   Stable, pt to continue current low chol diet

## 2021-04-28 NOTE — Assessment & Plan Note (Signed)
Lab Results  Component Value Date   CREATININE 1.20 02/21/2021   Stable overall, cont to avoid nephrotoxins

## 2021-07-03 ENCOUNTER — Telehealth: Payer: Self-pay | Admitting: Internal Medicine

## 2021-07-03 DIAGNOSIS — X32XXXD Exposure to sunlight, subsequent encounter: Secondary | ICD-10-CM | POA: Diagnosis not present

## 2021-07-03 DIAGNOSIS — L570 Actinic keratosis: Secondary | ICD-10-CM | POA: Diagnosis not present

## 2021-07-03 DIAGNOSIS — L821 Other seborrheic keratosis: Secondary | ICD-10-CM | POA: Diagnosis not present

## 2021-07-03 DIAGNOSIS — Z08 Encounter for follow-up examination after completed treatment for malignant neoplasm: Secondary | ICD-10-CM | POA: Diagnosis not present

## 2021-07-03 DIAGNOSIS — Z85828 Personal history of other malignant neoplasm of skin: Secondary | ICD-10-CM | POA: Diagnosis not present

## 2021-07-03 DIAGNOSIS — B078 Other viral warts: Secondary | ICD-10-CM | POA: Diagnosis not present

## 2021-07-03 NOTE — Telephone Encounter (Signed)
Forms given to PCP to review and sign.

## 2021-07-03 NOTE — Telephone Encounter (Signed)
Type of form received: Accessible Collection Service Application   Additional comments: Included the envelope with the mailing address   Received by: [place]  Form should be Faxed to: n/a  Form should be mailed to: Y- envelope is already address   Is patient requesting call for pickup:n   Form placed in the Provider's box.  *Attach charge sheet.  Provider will determine charge.*  Was patient informed of  7-10 business day turn around (Y/N)? n

## 2021-07-04 NOTE — Telephone Encounter (Signed)
Forms have been signed, a copy sent to scan and original has been mailed to the pt per her request.

## 2021-07-11 ENCOUNTER — Other Ambulatory Visit: Payer: Medicare Other

## 2021-07-12 ENCOUNTER — Telehealth: Payer: Self-pay

## 2021-07-12 ENCOUNTER — Encounter: Payer: Self-pay | Admitting: Internal Medicine

## 2021-07-12 NOTE — Telephone Encounter (Signed)
Patient requesting lab orders for a possible UTI

## 2021-07-13 ENCOUNTER — Other Ambulatory Visit: Payer: Self-pay | Admitting: Internal Medicine

## 2021-07-13 DIAGNOSIS — N390 Urinary tract infection, site not specified: Secondary | ICD-10-CM

## 2021-07-13 DIAGNOSIS — N3000 Acute cystitis without hematuria: Secondary | ICD-10-CM

## 2021-07-13 MED ORDER — METHENAMINE HIPPURATE 1 G PO TABS: 1.0000 g | ORAL_TABLET | Freq: Two times a day (BID) | ORAL | 1 refills | Status: DC

## 2021-07-13 MED ORDER — NITROFURANTOIN MONOHYD MACRO 100 MG PO CAPS: 100.0000 mg | ORAL_CAPSULE | Freq: Two times a day (BID) | ORAL | 1 refills | Status: AC

## 2021-07-19 ENCOUNTER — Other Ambulatory Visit: Payer: Self-pay

## 2021-07-19 ENCOUNTER — Encounter: Payer: Self-pay | Admitting: Internal Medicine

## 2021-07-19 ENCOUNTER — Ambulatory Visit (INDEPENDENT_AMBULATORY_CARE_PROVIDER_SITE_OTHER): Payer: Medicare Other | Admitting: Internal Medicine

## 2021-07-19 VITALS — BP 140/52 | HR 69 | Temp 97.6°F | Ht 65.0 in | Wt 166.0 lb

## 2021-07-19 DIAGNOSIS — I700 Atherosclerosis of aorta: Secondary | ICD-10-CM | POA: Diagnosis not present

## 2021-07-19 DIAGNOSIS — Z23 Encounter for immunization: Secondary | ICD-10-CM

## 2021-07-19 DIAGNOSIS — I10 Essential (primary) hypertension: Secondary | ICD-10-CM | POA: Diagnosis not present

## 2021-07-19 DIAGNOSIS — Z0001 Encounter for general adult medical examination with abnormal findings: Secondary | ICD-10-CM | POA: Diagnosis not present

## 2021-07-19 DIAGNOSIS — E60 Dietary zinc deficiency: Secondary | ICD-10-CM

## 2021-07-19 DIAGNOSIS — D599 Acquired hemolytic anemia, unspecified: Secondary | ICD-10-CM | POA: Diagnosis not present

## 2021-07-19 DIAGNOSIS — N1832 Chronic kidney disease, stage 3b: Secondary | ICD-10-CM | POA: Diagnosis not present

## 2021-07-19 DIAGNOSIS — D500 Iron deficiency anemia secondary to blood loss (chronic): Secondary | ICD-10-CM | POA: Diagnosis not present

## 2021-07-19 LAB — URINALYSIS, ROUTINE W REFLEX MICROSCOPIC
Bilirubin Urine: NEGATIVE
Hgb urine dipstick: NEGATIVE
Ketones, ur: NEGATIVE
Leukocytes,Ua: NEGATIVE
Nitrite: NEGATIVE
RBC / HPF: NONE SEEN (ref 0–?)
Specific Gravity, Urine: 1.01 (ref 1.000–1.030)
Total Protein, Urine: NEGATIVE
Urine Glucose: NEGATIVE
Urobilinogen, UA: 0.2 (ref 0.0–1.0)
pH: 6.5 (ref 5.0–8.0)

## 2021-07-19 LAB — CBC WITH DIFFERENTIAL/PLATELET
Basophils Absolute: 0 10*3/uL (ref 0.0–0.1)
Basophils Relative: 0.5 % (ref 0.0–3.0)
Eosinophils Absolute: 0.3 10*3/uL (ref 0.0–0.7)
Eosinophils Relative: 2.9 % (ref 0.0–5.0)
HCT: 27.6 % — ABNORMAL LOW (ref 36.0–46.0)
Hemoglobin: 9.6 g/dL — ABNORMAL LOW (ref 12.0–15.0)
Lymphocytes Relative: 15.8 % (ref 12.0–46.0)
Lymphs Abs: 1.5 10*3/uL (ref 0.7–4.0)
MCHC: 34.9 g/dL (ref 30.0–36.0)
MCV: 89.1 fl (ref 78.0–100.0)
Monocytes Absolute: 1 10*3/uL (ref 0.1–1.0)
Monocytes Relative: 10 % (ref 3.0–12.0)
Neutro Abs: 6.8 10*3/uL (ref 1.4–7.7)
Neutrophils Relative %: 70.8 % (ref 43.0–77.0)
Platelets: 244 10*3/uL (ref 150.0–400.0)
RBC: 3.1 Mil/uL — ABNORMAL LOW (ref 3.87–5.11)
RDW: 20.5 % — ABNORMAL HIGH (ref 11.5–15.5)
WBC: 9.6 10*3/uL (ref 4.0–10.5)

## 2021-07-19 LAB — HEPATIC FUNCTION PANEL
ALT: 13 U/L (ref 0–35)
AST: 20 U/L (ref 0–37)
Albumin: 4.3 g/dL (ref 3.5–5.2)
Alkaline Phosphatase: 70 U/L (ref 39–117)
Bilirubin, Direct: 0.4 mg/dL — ABNORMAL HIGH (ref 0.0–0.3)
Total Bilirubin: 1.9 mg/dL — ABNORMAL HIGH (ref 0.2–1.2)
Total Protein: 6.5 g/dL (ref 6.0–8.3)

## 2021-07-19 LAB — BASIC METABOLIC PANEL
BUN: 29 mg/dL — ABNORMAL HIGH (ref 6–23)
CO2: 25 mEq/L (ref 19–32)
Calcium: 9.2 mg/dL (ref 8.4–10.5)
Chloride: 106 mEq/L (ref 96–112)
Creatinine, Ser: 1.24 mg/dL — ABNORMAL HIGH (ref 0.40–1.20)
GFR: 39.18 mL/min — ABNORMAL LOW (ref >60.00)
Glucose, Bld: 96 mg/dL (ref 70–99)
Potassium: 4.3 mEq/L (ref 3.5–5.1)
Sodium: 140 mEq/L (ref 135–145)

## 2021-07-19 LAB — IBC + FERRITIN
Ferritin: 952.8 ng/mL — ABNORMAL HIGH (ref 10.0–291.0)
Iron: 72 ug/dL (ref 42–145)
Saturation Ratios: 31.6 % (ref 20.0–50.0)
TIBC: 228.2 ug/dL — ABNORMAL LOW (ref 250.0–450.0)
Transferrin: 163 mg/dL — ABNORMAL LOW (ref 212.0–360.0)

## 2021-07-19 NOTE — Progress Notes (Signed)
Subjective:  Patient ID: Claudia Lara, female    DOB: 08-03-1932  Age: 87 y.o. MRN: [id number]  CC: Annual Exam and Anemia  This visit occurred during the SARS-CoV-2 public health emergency.  Safety protocols were in place, including screening questions prior to the visit, additional usage of staff PPE, and extensive cleaning of exam room while observing appropriate contact time as indicated for disinfecting solutions.    HPI Claudia Lara presents for a CPX and f/up -   Claudia Lara called in a week ago with UTI symptoms.  Claudia Lara has frequent UTIs.  Claudia Lara has completed a 5-day course of nitrofurantoin and tells me her UTI symptoms have resolved.  Claudia Lara is also taking methenamine to prevent recurrent UTIs.  Claudia Lara is active and denies CP,palpitations, or edema -Claudia Lara has stable chronic shortness of breath and fatigue fatigue.  Outpatient Medications Prior to Visit  Medication Sig Dispense Refill   Calcium Carb-Cholecalciferol (CALCIUM CARBONATE-VITAMIN D3 PO) Take 1 tablet by mouth daily.      carvedilol (COREG) 12.5 MG tablet TAKE 1 TABLET BY MOUTH 2 TIMES DAILY WITH A MEAL 180 tablet 1   cholecalciferol (VITAMIN D) 1000 units tablet Take 1,000 Units by mouth daily.     methenamine (HIPREX) 1 g tablet Take 1 tablet (1 g total) by mouth 2 (two) times daily with a meal. 180 tablet 1   Multiple Vitamin (MULTIVITAMIN WITH MINERALS) TABS tablet Take 1 tablet by mouth daily.     Multiple Vitamins-Minerals (ZINC PO) Take by mouth.     torsemide (DEMADEX) 20 MG tablet Take 20 mg by mouth daily as needed.     traMADol (ULTRAM) 50 MG tablet Take 50 mg by mouth as needed.     vitamin C (ASCORBIC ACID) 500 MG tablet Take 500 mg by mouth daily.     No facility-administered medications prior to visit.    ROS Review of Systems  Constitutional:  Positive for fatigue. Negative for diaphoresis and unexpected weight change.  HENT: Negative.    Eyes: Negative.   Respiratory:  Positive for shortness of breath. Negative  for cough, chest tightness and wheezing.   Cardiovascular:  Negative for chest pain, palpitations and leg swelling.  Gastrointestinal:  Negative for abdominal pain, diarrhea, nausea and vomiting.  Endocrine: Negative.   Genitourinary: Negative.  Negative for decreased urine volume, difficulty urinating, dysuria, hematuria and urgency.  Musculoskeletal: Negative.   Skin: Negative.   Neurological:  Negative for dizziness, weakness, light-headedness and headaches.  Hematological:  Negative for adenopathy. Does not bruise/bleed easily.  Psychiatric/Behavioral: Negative.     Objective:  BP (!) 140/52 (BP Location: Left Arm, Patient Position: Sitting)   Pulse 69   Temp 97.6 F (36.4 C) (Oral)   Ht '5\' 5"'$  (1.651 m)   Wt 166 lb (75.3 kg)   SpO2 98%   BMI 27.62 kg/m   BP Readings from Last 3 Encounters:  07/19/21 (!) 140/52  04/25/21 (!) 132/50  02/21/21 (!) 160/60    Wt Readings from Last 3 Encounters:  07/19/21 166 lb (75.3 kg)  04/25/21 167 lb (75.8 kg)  03/07/21 170 lb (77.1 kg)    Physical Exam Vitals reviewed.  Constitutional:      Appearance: Normal appearance.  HENT:     Nose: Nose normal.     Mouth/Throat:     Mouth: Mucous membranes are moist.  Eyes:     Conjunctiva/sclera: Conjunctivae normal.  Cardiovascular:     Rate and Rhythm: Normal rate.  Heart sounds: Murmur heard.  Systolic murmur is present with a grade of 2/6.  No diastolic murmur is present.    No gallop.  Pulmonary:     Breath sounds: No stridor. No wheezing, rhonchi or rales.  Abdominal:     General: Abdomen is flat.     Palpations: There is no mass.     Tenderness: There is no abdominal tenderness. There is no guarding.     Hernia: No hernia is present.  Musculoskeletal:     Cervical back: Neck supple.     Right lower leg: No edema.     Left lower leg: No edema.  Lymphadenopathy:     Cervical: No cervical adenopathy.  Skin:    General: Skin is warm and dry.  Neurological:      General: No focal deficit present.     Mental Status: Claudia Lara is alert.  Psychiatric:        Mood and Affect: Mood normal.        Behavior: Behavior normal.    Lab Results  Component Value Date   WBC 9.6 07/19/2021   HGB 9.6 (L) 07/19/2021   HCT 27.6 (L) 07/19/2021   PLT 244.0 07/19/2021   GLUCOSE 96 07/19/2021   CHOL 108 05/17/2020   TRIG 69.0 05/17/2020   HDL 51.10 05/17/2020   LDLDIRECT 56.0 07/08/2018   LDLCALC 43 05/17/2020   ALT 13 07/19/2021   AST 20 07/19/2021   NA 140 07/19/2021   K 4.3 07/19/2021   CL 106 07/19/2021   CREATININE 1.24 (H) 07/19/2021   BUN 29 (H) 07/19/2021   CO2 25 07/19/2021   TSH 1.16 05/17/2020   INR 1.1 (H) 05/17/2020   HGBA1C 4.8 06/08/2019    MYOCARDIAL PERFUSION IMAGING  Result Date: 03/07/2021  The left ventricular ejection fraction is normal (55-65%).  Nuclear stress EF: 65%.  There was no ST segment deviation noted during stress.  The study is normal.  This is a low risk study.  No ischemia or infarction on perfusion images. Normal wall motion.    Assessment & Plan:   Claudia Lara was seen today for annual exam and anemia.  Diagnoses and all orders for this visit:  Essential hypertension- Her blood pressure is adequately well controlled. -     Basic metabolic panel; Future -     Urinalysis, Routine w reflex microscopic; Future -     Hepatic function panel; Future -     Hepatic function panel -     Basic metabolic panel -     Urinalysis, Routine w reflex microscopic  Stage 3b chronic kidney disease ([place])- Her renal function is stable.  Claudia Lara will avoid nephrotoxic agents. -     Basic metabolic panel; Future -     Urinalysis, Routine w reflex microscopic; Future -     Basic metabolic panel -     Urinalysis, Routine w reflex microscopic  Zinc deficiency- I will monitor her zinc level. -     CBC with Differential/Platelet; Future -     Zinc; Future -     CBC with Differential/Platelet -     Zinc  Iron deficiency anemia due to  chronic blood loss- Her iron level is normal now. -     CBC with Differential/Platelet; Future -     IBC + Ferritin; Future -     IBC + Ferritin -     CBC with Differential/Platelet  Acquired hemolytic anemia ([place])- Her H&H are stable.  There is no's  of active hemolysis. -     CBC with Differential/Platelet; Future -     CBC with Differential/Platelet  Atherosclerosis of aorta ([place])- Risk factor modifications have been addressed.  Need for vaccination -     Pneumococcal polysaccharide vaccine 23-valent greater than or equal to 2yo subcutaneous/IM  I am having Claudia Lara maintain her vitamin C, cholecalciferol, Calcium Carb-Cholecalciferol (CALCIUM CARBONATE-VITAMIN D3 PO), multivitamin with minerals, torsemide, Multiple Vitamins-Minerals (ZINC PO), carvedilol, traMADol, and methenamine.  No orders of the defined types were placed in this encounter.    Follow-up: Return in about 6 months (around 01/19/2022).  [name], MD

## 2021-07-19 NOTE — Patient Instructions (Signed)
Health Maintenance, Female Adopting a healthy lifestyle and getting preventive care are important in promoting health and wellness. Ask your health care provider about: The right schedule for you to have regular tests and exams. Things you can do on your own to prevent diseases and keep yourself healthy. What should I know about diet, weight, and exercise? Eat a healthy diet  Eat a diet that includes plenty of vegetables, fruits, low-fat dairy products, and lean protein. Do not eat a lot of foods that are high in solid fats, added sugars, or sodium.  Maintain a healthy weight Body mass index (BMI) is used to identify weight problems. It estimates body fat based on height and weight. Your health care provider can help determineyour BMI and help you achieve or maintain a healthy weight. Get regular exercise Get regular exercise. This is one of the most important things you can do for your health. Most adults should: Exercise for at least 150 minutes each week. The exercise should increase your heart rate and make you sweat (moderate-intensity exercise). Do strengthening exercises at least twice a week. This is in addition to the moderate-intensity exercise. Spend less time sitting. Even light physical activity can be beneficial. Watch cholesterol and blood lipids Have your blood tested for lipids and cholesterol at 85 years of age, then havethis test every 5 years. Have your cholesterol levels checked more often if: Your lipid or cholesterol levels are high. You are older than 85 years of age. You are at high risk for heart disease. What should I know about cancer screening? Depending on your health history and family history, you may need to have cancer screening at various ages. This may include screening for: Breast cancer. Cervical cancer. Colorectal cancer. Skin cancer. Lung cancer. What should I know about heart disease, diabetes, and high blood pressure? Blood pressure and heart  disease High blood pressure causes heart disease and increases the risk of stroke. This is more likely to develop in people who have high blood pressure readings, are of African descent, or are overweight. Have your blood pressure checked: Every 3-5 years if you are 18-39 years of age. Every year if you are 40 years old or older. Diabetes Have regular diabetes screenings. This checks your fasting blood sugar level. Have the screening done: Once every three years after age 40 if you are at a normal weight and have a low risk for diabetes. More often and at a younger age if you are overweight or have a high risk for diabetes. What should I know about preventing infection? Hepatitis B If you have a higher risk for hepatitis B, you should be screened for this virus. Talk with your health care provider to find out if you are at risk forhepatitis B infection. Hepatitis C Testing is recommended for: Everyone born from 1945 through 1965. Anyone with known risk factors for hepatitis C. Sexually transmitted infections (STIs) Get screened for STIs, including gonorrhea and chlamydia, if: You are sexually active and are younger than 85 years of age. You are older than 85 years of age and your health care provider tells you that you are at risk for this type of infection. Your sexual activity has changed since you were last screened, and you are at increased risk for chlamydia or gonorrhea. Ask your health care provider if you are at risk. Ask your health care provider about whether you are at high risk for HIV. Your health care provider may recommend a prescription medicine to help   prevent HIV infection. If you choose to take medicine to prevent HIV, you should first get tested for HIV. You should then be tested every 3 months for as long as you are taking the medicine. Pregnancy If you are about to stop having your period (premenopausal) and you may become pregnant, seek counseling before you get  pregnant. Take 400 to 800 micrograms (mcg) of folic acid every day if you become pregnant. Ask for birth control (contraception) if you want to prevent pregnancy. Osteoporosis and menopause Osteoporosis is a disease in which the bones lose minerals and strength with aging. This can result in bone fractures. If you are 65 years old or older, or if you are at risk for osteoporosis and fractures, ask your health care provider if you should: Be screened for bone loss. Take a calcium or vitamin D supplement to lower your risk of fractures. Be given hormone replacement therapy (HRT) to treat symptoms of menopause. Follow these instructions at home: Lifestyle Do not use any products that contain nicotine or tobacco, such as cigarettes, e-cigarettes, and chewing tobacco. If you need help quitting, ask your health care provider. Do not use street drugs. Do not share needles. Ask your health care provider for help if you need support or information about quitting drugs. Alcohol use Do not drink alcohol if: Your health care provider tells you not to drink. You are pregnant, may be pregnant, or are planning to become pregnant. If you drink alcohol: Limit how much you use to 0-1 drink a day. Limit intake if you are breastfeeding. Be aware of how much alcohol is in your drink. In the [place] one drink equals one 12 oz bottle of beer (355 mL), one 5 oz glass of wine (148 mL), or one 1 oz glass of hard liquor (44 mL). General instructions Schedule regular health, dental, and eye exams. Stay current with your vaccines. Tell your health care provider if: You often feel depressed. You have ever been abused or do not feel safe at home. Summary Adopting a healthy lifestyle and getting preventive care are important in promoting health and wellness. Follow your health care provider's instructions about healthy diet, exercising, and getting tested or screened for diseases. Follow your health care provider's  instructions on monitoring your cholesterol and blood pressure. This information is not intended to replace advice given to you by your health care provider. Make sure you discuss any questions you have with your healthcare provider. Document Revised: 11/04/2018 Document Reviewed: 11/04/2018 Elsevier Patient Education  2022 Elsevier Inc.  

## 2021-07-21 LAB — ZINC: Zinc: 81 ug/dL (ref 60–130)

## 2021-07-22 DIAGNOSIS — Z23 Encounter for immunization: Secondary | ICD-10-CM | POA: Insufficient documentation

## 2021-07-22 DIAGNOSIS — Z0001 Encounter for general adult medical examination with abnormal findings: Secondary | ICD-10-CM | POA: Insufficient documentation

## 2021-07-25 ENCOUNTER — Encounter: Payer: Self-pay | Admitting: Orthopaedic Surgery

## 2021-07-25 ENCOUNTER — Ambulatory Visit: Payer: Self-pay

## 2021-07-25 ENCOUNTER — Other Ambulatory Visit: Payer: Self-pay

## 2021-07-25 ENCOUNTER — Ambulatory Visit: Payer: Medicare Other | Admitting: Orthopaedic Surgery

## 2021-07-25 VITALS — Ht 63.5 in | Wt 166.0 lb

## 2021-07-25 DIAGNOSIS — M25511 Pain in right shoulder: Secondary | ICD-10-CM | POA: Diagnosis not present

## 2021-07-25 DIAGNOSIS — G8929 Other chronic pain: Secondary | ICD-10-CM | POA: Diagnosis not present

## 2021-07-25 NOTE — Progress Notes (Signed)
Office Visit Note   Patient: Claudia Lara           Date of Birth: [birth year]           MRN: [id number] Visit Date: 07/25/2021              Requested by: Janith Lima, MD [address] [place],  [place] [address] PCP: Janith Lima, MD   Assessment & Plan: Visit Diagnoses:  1. Chronic right shoulder pain     Plan: Claudia Lara experienced insidious onset of right shoulder pain "a few months ago".  She denies any history of injury or trauma but does work for hours in the day in her yard tending to her garden.  She has had a lot of trouble with overhead activity and occasional popping and clicking.  She was concerned because she has had rotator cuff tear surgery on the left side.  X-rays were nondiagnostic.  Her exam was consistent with very mild impingement.  She is able to raise her arm fully over her head as she does on the left side.  Certainly there is a possibility of a small rotator cuff tear.  Long discussion regarding different treatment options.  She can try Voltaren gel and continue with her exercises.  She did not want to consider cortisone injection at this point but that certainly should be a consideration in the future.  Might even want to consider an MRI scan if symptoms worsen.  She will let me know  Follow-Up Instructions: Return if symptoms worsen or fail to improve.   Orders:  Orders Placed This Encounter  Procedures   XR Shoulder Right   No orders of the defined types were placed in this encounter.     Procedures: No procedures performed   Clinical Data: No additional findings.   Subjective: Chief Complaint  Patient presents with   Right Shoulder - Pain  Patient presents today for her right shoulder. She said that it pops. No pain. She said that she is right hand dominant. She has no difficulty with range of motion or weakness.   HPI  Review of Systems   Objective: Vital Signs: Ht 5' 3.5" (1.613 m)   Wt 166 lb (75.3 kg)   BMI 28.94 kg/m    Physical Exam Constitutional:      Appearance: She is well-developed.  Eyes:     Pupils: Pupils are equal, round, and reactive to light.  Pulmonary:     Effort: Pulmonary effort is normal.  Skin:    General: Skin is warm and dry.  Neurological:     Mental Status: She is alert and oriented to person, place, and time.  Psychiatric:        Behavior: Behavior normal.    Ortho Exam right shoulder with quick full overhead motion.  There is just a little bit of popping clicking along the anterior subacromial region with internal extra rotation and minimal pain with external rotation of the impingement position.  Negative empty can testing.  Negative Speed sign.  Skin intact.  Multiple degenerative changes throughout the MP, PIP and DIP joints of both hands but quite functional.  No loss of shoulder motion  Specialty Comments:  No specialty comments available.  Imaging: XR Shoulder Right  Result Date: 07/25/2021 Films of the right shoulder obtained in 3 projections.  Humeral head is centered about the glenoid.  Does not appear to have any significant degenerative changes of the glenohumeral joint but there is  a very small inferior humeral head spur that might indicate some very early degenerative change.  Normal space between the humeral head and the acromion.  There are degenerative changes at the [facility] joint with some inferiorly directed spurring at the distal clavicle and acromion which may predispose to impingement.  No ectopic calcification or acute changes    PMFS History: Patient Active Problem List   Diagnosis Date Noted   Pain in right shoulder 07/25/2021   Need for vaccination 07/22/2021   Encounter for general adult medical examination with abnormal findings 07/22/2021   Recurrent UTI (urinary tract infection) 07/13/2021   Estrogen deficiency 01/22/2021   OAB (overactive bladder) 11/01/2020   Carpal tunnel syndrome, left upper limb 04/05/2020   Iron deficiency anemia due to  chronic blood loss 09/08/2019   Interstitial pulmonary disease ([place]) 09/08/2019   Lower leg DVT (deep venous thromboembolism), acute, left ([place]) 08/18/2019   OA (osteoarthritis) of knee 07/26/2019   DDD (degenerative disc disease), cervical 04/16/2019   Zinc deficiency 10/03/2017   Spondylosis, lumbar, with myelopathy 04/09/2017   GERD (gastroesophageal reflux disease) 08/03/2015   Hemolytic anemia ([place]) 07/12/2015   Chronic venous insufficiency 08/10/2014   Aortic stenosis, mild 06/29/2014   Left ventricular diastolic dysfunction, NYHA class 1 06/29/2014   Senile osteopenia 04/21/2014   Hyperlipidemia with target LDL less than 130 04/21/2014   Kidney disease, chronic, stage III (GFR 30-59 ml/min) ([place]) 04/21/2014   Routine health maintenance 07/16/2012   Carotid artery stenosis without cerebral infarction 01/16/2010   Essential hypertension 10/19/2007   Past Medical History:  Diagnosis Date   ANEMIA 08/22/2009   Aortic stenosis    AR (aortic regurgitation) 01/2019   Mild to Moderate, Noted on ECHO   CAROTID ARTERY STENOSIS 123456   Complication of anesthesia    very hard to wake up from surgery   DYSPNEA ON EXERTION 11/16/2007   Gallstones    GEN OSTEOARTHROSIS INVOLVING MULTIPLE SITES 11/11/2008   GERD (gastroesophageal reflux disease)    Grade I diastolic dysfunction 123456   Noted on ECHO   Heart murmur    HEMORRHOIDS, INTERNAL    surgery was in the 90's (per patient)   History of blood transfusion    after hip and knee replacements   NECK PAIN, ACUTE 12/28/2009   PERIPHERAL EDEMA 10/06/2007   PONV (postoperative nausea and vomiting)    Pulmonary nodule 06/18/2019   noted on CT Chest    PVD 10/06/2007   Unspecified essential hypertension 10/19/2007    Family History  Problem Relation Age of Onset   Ovarian cancer Mother    Cancer Mother    Leukemia Father    Lung cancer Father    Cancer Father    Cancer Brother    Diabetes Neg Hx    Heart disease Neg Hx     Hypertension Neg Hx     Past Surgical History:  Procedure Laterality Date   ABDOMINAL HYSTERECTOMY     BIOPSY  08/06/2019   Procedure: BIOPSY;  Surgeon: Gatha Mayer, MD;  Location: [facility];  Service: Gastroenterology;;   [name]     bilat   CARPAL TUNNEL RELEASE     CHOLECYSTECTOMY  01/29/2016   at [place] (ERCP) WITH PROPOFOL N/A 12/01/2015   Procedure: ENDOSCOPIC RETROGRADE CHOLANGIOPANCREATOGRAPHY (ERCP) WITH PROPOFOL;  Surgeon: [name], MD;  Location: [facility];  Service: Endoscopy;  Laterality: N/A;   ENDOSCOPIC RETROGRADE CHOLANGIOPANCREATOGRAPHY (ERCP)  WITH PROPOFOL N/A 02/01/2016   Procedure: ENDOSCOPIC RETROGRADE CHOLANGIOPANCREATOGRAPHY (ERCP) WITH PROPOFOL;  Surgeon: [name], MD;  Location: [facility];  Service: Endoscopy;  Laterality: N/A;   ENDOSCOPIC RETROGRADE CHOLANGIOPANCREATOGRAPHY (ERCP) WITH PROPOFOL N/A 04/07/2018   Procedure: ENDOSCOPIC RETROGRADE CHOLANGIOPANCREATOGRAPHY (ERCP) WITH PROPOFOL;  Surgeon: [name], MD;  Location: [facility];  Service: Endoscopy;  Laterality: N/A;   ESOPHAGOGASTRODUODENOSCOPY (EGD) WITH PROPOFOL N/A 08/06/2019   Procedure: ESOPHAGOGASTRODUODENOSCOPY (EGD) WITH PROPOFOL;  Surgeon: Gatha Mayer, MD;  Location: [facility];  Service: Gastroenterology;  Laterality: N/A;   HAMMER TOE SURGERY     HEMORRHOID SURGERY     Hyperplastic colon polyps, removed  2007   By Dr. Penelope Coop   JOINT REPLACEMENT  2011   right knee   OOPHORECTOMY     REMOVAL OF STONES  04/07/2018   Procedure: REMOVAL OF STONES;  Surgeon: [name], MD;  Location: [facility];  Service: Endoscopy;;   ROTATOR CUFF REPAIR  2004   left (Dr. Durward Fortes)   [place]  04/07/2018   Procedure: Joan Mayans;  Surgeon: [name], MD;  Location: [facility];  Service: Endoscopy;;   TOTAL HIP ARTHROPLASTY Right 11/26/2016   Procedure:  RIGHT TOTAL HIP ARTHROPLASTY;  Surgeon: Garald Balding, MD;  Location: [facility];  Service: Orthopedics;  Laterality: Right;   TOTAL KNEE ARTHROPLASTY Right    TOTAL KNEE ARTHROPLASTY Left 07/26/2019   Procedure: TOTAL KNEE ARTHROPLASTY;  Surgeon: Gaynelle Arabian, MD;  Location: [facility];  Service: Orthopedics;  Laterality: Left;  27mn   Social History   Occupational History   Occupation: sProducer, television/film/video RETIRED    Comment: retired  Tobacco Use   Smoking status: Never   Smokeless tobacco: Never  Vaping Use   Vaping Use: Never used  Substance and Sexual Activity   Alcohol use: No   Drug use: No   Sexual activity: Not Currently    Birth control/protection: Surgical

## 2021-08-11 ENCOUNTER — Other Ambulatory Visit: Payer: Self-pay | Admitting: Internal Medicine

## 2021-08-11 DIAGNOSIS — I10 Essential (primary) hypertension: Secondary | ICD-10-CM

## 2021-08-11 DIAGNOSIS — I5189 Other ill-defined heart diseases: Secondary | ICD-10-CM

## 2021-09-06 ENCOUNTER — Other Ambulatory Visit: Payer: Self-pay

## 2021-09-06 ENCOUNTER — Ambulatory Visit (INDEPENDENT_AMBULATORY_CARE_PROVIDER_SITE_OTHER): Payer: Medicare Other

## 2021-09-06 VITALS — BP 120/60 | HR 86 | Temp 98.3°F | Ht 64.0 in | Wt 166.8 lb

## 2021-09-06 DIAGNOSIS — Z0000 Encounter for general adult medical examination without abnormal findings: Secondary | ICD-10-CM

## 2021-09-06 NOTE — Patient Instructions (Signed)
[name] , Thank you for taking time to come for your Medicare Wellness Visit. I appreciate your ongoing commitment to your health goals. Please review the following plan we discussed and let me know if I can assist you in the future.   Screening recommendations/referrals: Colonoscopy: Not a candidate for screening due to age 87: Not a candidate for screening due to age Bone Density: Not a candidate for screening due to age Recommended yearly ophthalmology/optometry visit for glaucoma screening and checkup Recommended yearly dental visit for hygiene and checkup  Vaccinations: Influenza vaccine: 08/12/2021 Pneumococcal vaccine: 02/01/2014, 07/19/2021 Tdap vaccine: 07/16/2012; due every 10 years Shingles vaccine: 04/19/2020   Covid-19: 02/26/2020, 03/22/2020, 11/15/2020  Advanced directives: Yes; documents on file.  Conditions/risks identified: Yes; Client understands the importance of follow-up with providers by attending scheduled visits and discussed goals to eat healthier, increase physical activity, exercise the brain, socialize more, get enough sleep and make time for laughter.  Next appointment: Please schedule your next Medicare Wellness Visit with your Nurse Health Advisor in 1 year by calling [phone].   Preventive Care 57 Years and Older, Female Preventive care refers to lifestyle choices and visits with your health care provider that can promote health and wellness. What does preventive care include? A yearly physical exam. This is also called an annual well check. Dental exams once or twice a year. Routine eye exams. Ask your health care provider how often you should have your eyes checked. Personal lifestyle choices, including: Daily care of your teeth and gums. Regular physical activity. Eating a healthy diet. Avoiding tobacco and drug use. Limiting alcohol use. Practicing safe sex. Taking low-dose aspirin every day. Taking vitamin and mineral supplements as  recommended by your health care provider. What happens during an annual well check? The services and screenings done by your health care provider during your annual well check will depend on your age, overall health, lifestyle risk factors, and family history of disease. Counseling  Your health care provider may ask you questions about your: Alcohol use. Tobacco use. Drug use. Emotional well-being. Home and relationship well-being. Sexual activity. Eating habits. History of falls. Memory and ability to understand (cognition). Work and work Statistician. Reproductive health. Screening  You may have the following tests or measurements: Height, weight, and BMI. Blood pressure. Lipid and cholesterol levels. These may be checked every 5 years, or more frequently if you are over 38 years old. Skin check. Lung cancer screening. You may have this screening every year starting at age 47 if you have a 30-pack-year history of smoking and currently smoke or have quit within the past 15 years. Fecal occult blood test (FOBT) of the stool. You may have this test every year starting at age 82. Flexible sigmoidoscopy or colonoscopy. You may have a sigmoidoscopy every 5 years or a colonoscopy every 10 years starting at age 25. Hepatitis C blood test. Hepatitis B blood test. Sexually transmitted disease (STD) testing. Diabetes screening. This is done by checking your blood sugar (glucose) after you have not eaten for a while (fasting). You may have this done every 1-3 years. Bone density scan. This is done to screen for osteoporosis. You may have this done starting at age 45. Mammogram. This may be done every 1-2 years. Talk to your health care provider about how often you should have regular mammograms. Talk with your health care provider about your test results, treatment options, and if necessary, the need for more tests. Vaccines  Your health care provider may  recommend certain vaccines, such  as: Influenza vaccine. This is recommended every year. Tetanus, diphtheria, and acellular pertussis (Tdap, Td) vaccine. You may need a Td booster every 10 years. Zoster vaccine. You may need this after age 53. Pneumococcal 13-valent conjugate (PCV13) vaccine. One dose is recommended after age 55. Pneumococcal polysaccharide (PPSV23) vaccine. One dose is recommended after age 31. Talk to your health care provider about which screenings and vaccines you need and how often you need them. This information is not intended to replace advice given to you by your health care provider. Make sure you discuss any questions you have with your health care provider. Document Released: 12/08/2015 Document Revised: 07/31/2016 Document Reviewed: 09/12/2015 Elsevier Interactive Patient Education  2017 [place] Prevention in the Home Falls can cause injuries. They can happen to people of all ages. There are many things you can do to make your home safe and to help prevent falls. What can I do on the outside of my home? Regularly fix the edges of walkways and driveways and fix any cracks. Remove anything that might make you trip as you walk through a door, such as a raised step or threshold. Trim any bushes or trees on the path to your home. Use bright outdoor lighting. Clear any walking paths of anything that might make someone trip, such as rocks or tools. Regularly check to see if handrails are loose or broken. Make sure that both sides of any steps have handrails. Any raised decks and porches should have guardrails on the edges. Have any leaves, snow, or ice cleared regularly. Use sand or salt on walking paths during winter. Clean up any spills in your garage right away. This includes oil or grease spills. What can I do in the bathroom? Use night lights. Install grab bars by the toilet and in the tub and shower. Do not use towel bars as grab bars. Use non-skid mats or decals in the tub or  shower. If you need to sit down in the shower, use a plastic, non-slip stool. Keep the floor dry. Clean up any water that spills on the floor as soon as it happens. Remove soap buildup in the tub or shower regularly. Attach bath mats securely with double-sided non-slip rug tape. Do not have throw rugs and other things on the floor that can make you trip. What can I do in the bedroom? Use night lights. Make sure that you have a light by your bed that is easy to reach. Do not use any sheets or blankets that are too big for your bed. They should not hang down onto the floor. Have a firm chair that has side arms. You can use this for support while you get dressed. Do not have throw rugs and other things on the floor that can make you trip. What can I do in the kitchen? Clean up any spills right away. Avoid walking on wet floors. Keep items that you use a lot in easy-to-reach places. If you need to reach something above you, use a strong step stool that has a grab bar. Keep electrical cords out of the way. Do not use floor polish or wax that makes floors slippery. If you must use wax, use non-skid floor wax. Do not have throw rugs and other things on the floor that can make you trip. What can I do with my stairs? Do not leave any items on the stairs. Make sure that there are handrails on both sides of  the stairs and use them. Fix handrails that are broken or loose. Make sure that handrails are as long as the stairways. Check any carpeting to make sure that it is firmly attached to the stairs. Fix any carpet that is loose or worn. Avoid having throw rugs at the top or bottom of the stairs. If you do have throw rugs, attach them to the floor with carpet tape. Make sure that you have a light switch at the top of the stairs and the bottom of the stairs. If you do not have them, ask someone to add them for you. What else can I do to help prevent falls? Wear shoes that: Do not have high heels. Have  rubber bottoms. Are comfortable and fit you well. Are closed at the toe. Do not wear sandals. If you use a stepladder: Make sure that it is fully opened. Do not climb a closed stepladder. Make sure that both sides of the stepladder are locked into place. Ask someone to hold it for you, if possible. Clearly mark and make sure that you can see: Any grab bars or handrails. First and last steps. Where the edge of each step is. Use tools that help you move around (mobility aids) if they are needed. These include: Canes. Walkers. Scooters. Crutches. Turn on the lights when you go into a dark area. Replace any light bulbs as soon as they burn out. Set up your furniture so you have a clear path. Avoid moving your furniture around. If any of your floors are uneven, fix them. If there are any pets around you, be aware of where they are. Review your medicines with your doctor. Some medicines can make you feel dizzy. This can increase your chance of falling. Ask your doctor what other things that you can do to help prevent falls. This information is not intended to replace advice given to you by your health care provider. Make sure you discuss any questions you have with your health care provider. Document Released: 09/07/2009 Document Revised: 04/18/2016 Document Reviewed: 12/16/2014 Elsevier Interactive Patient Education  2017 [employer].

## 2021-09-06 NOTE — Progress Notes (Signed)
Subjective:   Claudia Lara is a 87 y.o. female who presents for Medicare Annual (Subsequent) preventive examination.  Review of Systems     Cardiac Risk Factors include: advanced age (>77men, >2 women);dyslipidemia;hypertension     Objective:    Today's Vitals   09/06/21 1049  BP: 120/60  Pulse: 86  Temp: 98.3 F (36.8 C)  SpO2: 94%  Weight: 166 lb 12.8 oz (75.7 kg)  Height: 5\' 4"  (1.626 m)  PainSc: 0-No pain   Body mass index is 28.63 kg/m.  Advanced Directives 09/06/2021 05/31/2020 08/31/2019 08/30/2019 08/04/2019 08/04/2019 07/28/2019  Does Patient Have a Medical Advance Directive? Yes Yes Yes Yes Yes Yes Yes  Type of Advance Directive - - [place];Living will Living will [place];Living will Living will Living will  Does patient want to make changes to medical advance directive? No - Patient declined No - Patient declined No - Patient declined - No - Patient declined - No - Patient declined  Copy of [place] in Chart? - - No - copy requested - No - copy requested - No - copy requested  Would patient like information on creating a medical advance directive? - - - - - - -  Pre-existing out of facility DNR order (yellow form or pink MOST form) - - - - - - -    Current Medications (verified) Outpatient Encounter Medications as of 09/06/2021  Medication Sig   Calcium Carb-Cholecalciferol (CALCIUM CARBONATE-VITAMIN D3 PO) Take 1 tablet by mouth daily.    carvedilol (COREG) 12.5 MG tablet TAKE 1 TABLET BY MOUTH 2 TIMES DAILY WITH A MEAL   cholecalciferol (VITAMIN D) 1000 units tablet Take 1,000 Units by mouth daily.   methenamine (HIPREX) 1 g tablet Take 1 tablet (1 g total) by mouth 2 (two) times daily with a meal.   Multiple Vitamin (MULTIVITAMIN WITH MINERALS) TABS tablet Take 1 tablet by mouth daily.   Multiple Vitamins-Minerals (ZINC PO) Take by mouth.   torsemide (DEMADEX) 20 MG tablet Take 20 mg by mouth daily as  needed.   traMADol (ULTRAM) 50 MG tablet Take 50 mg by mouth as needed.   vitamin C (ASCORBIC ACID) 500 MG tablet Take 500 mg by mouth daily.   No facility-administered encounter medications on file as of 09/06/2021.    Allergies (verified) Sulfonamide derivatives   History: Past Medical History:  Diagnosis Date   ANEMIA 08/22/2009   Aortic stenosis    AR (aortic regurgitation) 01/2019   Mild to Moderate, Noted on ECHO   CAROTID ARTERY STENOSIS 2/29/7989   Complication of anesthesia    very hard to wake up from surgery   DYSPNEA ON EXERTION 11/16/2007   Gallstones    GEN OSTEOARTHROSIS INVOLVING MULTIPLE SITES 11/11/2008   GERD (gastroesophageal reflux disease)    Grade I diastolic dysfunction 21/1941   Noted on ECHO   Heart murmur    HEMORRHOIDS, INTERNAL    surgery was in the 90's (per patient)   History of blood transfusion    after hip and knee replacements   NECK PAIN, ACUTE 12/28/2009   PERIPHERAL EDEMA 10/06/2007   PONV (postoperative nausea and vomiting)    Pulmonary nodule 06/18/2019   noted on CT Chest    PVD 10/06/2007   Unspecified essential hypertension 10/19/2007   Past Surgical History:  Procedure Laterality Date   ABDOMINAL HYSTERECTOMY     BIOPSY  08/06/2019   Procedure: BIOPSY;  Surgeon: Gatha Mayer,  MD;  Location: [facility];  Service: Gastroenterology;;   BUNIONECTOMY     bilat   CARPAL TUNNEL RELEASE     CHOLECYSTECTOMY  01/29/2016   at [facility] (ERCP) WITH PROPOFOL N/A 12/01/2015   Procedure: ENDOSCOPIC RETROGRADE CHOLANGIOPANCREATOGRAPHY (ERCP) WITH PROPOFOL;  Surgeon: [name], MD;  Location: [facility];  Service: Endoscopy;  Laterality: N/A;   ENDOSCOPIC RETROGRADE CHOLANGIOPANCREATOGRAPHY (ERCP) WITH PROPOFOL N/A 02/01/2016   Procedure: ENDOSCOPIC RETROGRADE CHOLANGIOPANCREATOGRAPHY (ERCP) WITH PROPOFOL;  Surgeon: [name], MD;  Location:  [facility];  Service: Endoscopy;  Laterality: N/A;   ENDOSCOPIC RETROGRADE CHOLANGIOPANCREATOGRAPHY (ERCP) WITH PROPOFOL N/A 04/07/2018   Procedure: ENDOSCOPIC RETROGRADE CHOLANGIOPANCREATOGRAPHY (ERCP) WITH PROPOFOL;  Surgeon: [name], MD;  [facility];  Service: Endoscopy;  Laterality: N/A;   ESOPHAGOGASTRODUODENOSCOPY (EGD) WITH PROPOFOL N/A 08/06/2019   Procedure: ESOPHAGOGASTRODUODENOSCOPY (EGD) WITH PROPOFOL;  Surgeon: Gatha Mayer, MD;  Location: [facility];  Service: Gastroenterology;  Laterality: N/A;   HAMMER TOE SURGERY     HEMORRHOID SURGERY     Hyperplastic colon polyps, removed  2007   By Dr. Penelope Coop   JOINT REPLACEMENT  2011   right knee   OOPHORECTOMY     REMOVAL OF STONES  04/07/2018   Procedure: REMOVAL OF STONES;  Surgeon: [name], MD;  Location: [facility];  Service: Endoscopy;;   ROTATOR CUFF REPAIR  2004   left (Dr. Durward Fortes)   [place]  04/07/2018   Procedure: Joan Mayans;  Surgeon: [name], MD;  Location: [facility];  Service: Endoscopy;;   TOTAL HIP ARTHROPLASTY Right 11/26/2016   Procedure: RIGHT TOTAL HIP ARTHROPLASTY;  Surgeon: Garald Balding, MD;  Location: [facility];  Service: Orthopedics;  Laterality: Right;   TOTAL KNEE ARTHROPLASTY Right    TOTAL KNEE ARTHROPLASTY Left 07/26/2019   Procedure: TOTAL KNEE ARTHROPLASTY;  Surgeon: Gaynelle Arabian, MD;  Location: [facility];  Service: Orthopedics;  Laterality: Left;  64min   Family History  Problem Relation Age of Onset   Ovarian cancer Mother    Cancer Mother    Leukemia Father    Lung cancer Father    Cancer Father    Cancer Brother    Diabetes Neg Hx    Heart disease Neg Hx    Hypertension Neg Hx    Social History   Socioeconomic History   Marital status: Divorced    Spouse name: Not on file   Number of children: 1   Years of education: 12   Highest education level: Not on file  Occupational History   Occupation: Producer, television/film/video: RETIRED     Comment: retired  Tobacco Use   Smoking status: Never   Smokeless tobacco: Never  Vaping Use   Vaping Use: Never used  Substance and Sexual Activity   Alcohol use: No   Drug use: No   Sexual activity: Not Currently    Birth control/protection: Surgical  Other Topics Concern   Not on file  Social History Narrative   HSG. Married - divorced for many years.  Retired. Lives alone - very active and independent.   Social Determinants of Health   Financial Resource Strain: Low Risk    Difficulty of Paying Living Expenses: Not hard at all  Food Insecurity: No Food Insecurity   Worried About Charity fundraiser in the Last Year: Never true   [place] in the Last Year: Never  true  Transportation Needs: No Transportation Needs   Lack of Transportation (Medical): No   Lack of Transportation (Non-Medical): No  Physical Activity: Sufficiently Active   Days of Exercise per Week: 5 days   Minutes of Exercise per Session: 30 min  Stress: No Stress Concern Present   Feeling of Stress : Not at all  Social Connections: Moderately Integrated   Frequency of Communication with Friends and Family: More than three times a week   Frequency of Social Gatherings with Friends and Family: More than three times a week   Attends Religious Services: More than 4 times per year   Active Member of [employer] or Organizations: Yes   Attends Music therapist: More than 4 times per year   Marital Status: Divorced    Tobacco Counseling Counseling given: Not Answered   Clinical Intake:  Pre-visit preparation completed: Yes  Pain : No/denies pain Pain Score: 0-No pain     BMI - recorded: 28.63 Nutritional Status: BMI 25 -29 Overweight Nutritional Risks: None Diabetes: No  How often do you need to have someone help you when you read instructions, pamphlets, or other written materials from your doctor or pharmacy?: 1 - Never What is the last grade level you completed in school?: High  School Graduate  Diabetic? no  Interpreter Needed?: No  Information entered by :: Lisette Abu, LPN   Activities of Daily Living In your present state of health, do you have any difficulty performing the following activities: 09/06/2021 07/19/2021  Hearing? N N  Vision? N N  Difficulty concentrating or making decisions? N N  Walking or climbing stairs? N N  Dressing or bathing? N N  Doing errands, shopping? N N  Preparing Food and eating ? N -  Using the Toilet? N -  In the past six months, have you accidently leaked urine? N -  Do you have problems with loss of bowel control? N -  Managing your Medications? N -  Managing your Finances? N -  Housekeeping or managing your Housekeeping? N -  Some recent data might be hidden    Patient Care Team: Janith Lima, MD as PCP - General (Internal Medicine) Fay Records, MD as PCP - Cardiology (Cardiology) [name], MD (Gastroenterology) Darlin Coco, MD as Consulting Physician (Cardiology) Garald Balding, MD (Orthopedic Surgery) Charlton Haws, [facility] (Pharmacist)  Indicate any recent Medical Services you may have received from other than [facility] providers in the past year (date may be approximate).     Assessment:   This is a routine wellness examination for Najmah.  Hearing/Vision screen Hearing Screening - Comments:: Patient denied any hearing difficulty.   No hearing aids.  Vision Screening - Comments:: Patient wears corrective glasses/contacts.  Eye exam done annually by: Rutherford Guys, MD.  Dietary issues and exercise activities discussed: Current Exercise Habits: Home exercise routine, Type of exercise: walking, Time (Minutes): 30, Frequency (Times/Week): 5, Weekly Exercise (Minutes/Week): 150, Intensity: Moderate, Exercise limited by: respiratory conditions(s);orthopedic condition(s)   Goals Addressed             This Visit's Progress    Patient Stated       To maintain my current health  status by continuing to eat healthy, stay physically active and socially active.      Depression Screen PHQ 2/9 Scores 09/06/2021 07/19/2021 05/31/2020 06/08/2019 07/09/2018 07/08/2018 02/17/2017  PHQ - 2 Score 0 0 0 0 0 0 0    Fall Risk Fall Risk  09/06/2021 07/19/2021 05/31/2020 06/08/2019 02/01/2019  Falls in the past year? 0 0 0 0 0  Number falls in past yr: 0 - 0 0 0  Injury with Fall? 0 - 0 0 0  Risk for fall due to : - - Orthopedic patient Impaired mobility;Impaired balance/gait -  Follow up Falls evaluation completed - Falls evaluation completed Falls evaluation completed;Education provided Falls evaluation completed    FALL RISK PREVENTION PERTAINING TO THE HOME:  Any stairs in or around the home? No  If so, are there any without handrails? No  Home free of loose throw rugs in walkways, pet beds, electrical cords, etc? Yes  Adequate lighting in your home to reduce risk of falls? Yes   ASSISTIVE DEVICES UTILIZED TO PREVENT FALLS:  Life alert? No  Use of a cane, walker or w/c? No  Grab bars in the bathroom? Yes  Shower chair or bench in shower? Yes  Elevated toilet seat or a handicapped toilet? No   TIMED UP AND GO:  Was the test performed? Yes .  Length of time to ambulate 10 feet: 8 sec.   Gait steady and fast without use of assistive device  Cognitive Function: Normal cognitive status assessed by direct observation by this Nurse Health Advisor. No abnormalities found.          Immunizations Immunization History  Administered Date(s) Administered   Fluad Quad(high Dose 65+) 08/04/2019, 10/30/2020   Influenza Whole 08/26/2009, 08/25/2012   Influenza, High Dose Seasonal PF 08/03/2015, 08/01/2016, 07/31/2017, 09/01/2018   Influenza-Unspecified 07/26/2013, 07/28/2014, 08/12/2021   PFIZER(Purple Top)SARS-COV-2 Vaccination 02/26/2020, 03/22/2020, 11/15/2020   Pneumococcal Conjugate-13 02/01/2014   Pneumococcal Polysaccharide-23 05/15/2015, 07/19/2021   Td 07/16/2012    Zoster Recombinat (Shingrix) 04/19/2020   Zoster, Live 01/23/2013    TDAP status: Up to date  Flu Vaccine status: Up to date  Pneumococcal vaccine status: Up to date  Covid-19 vaccine status: Completed vaccines  Qualifies for Shingles Vaccine? Yes   Zostavax completed Yes   Shingrix Completed?: No.    Education has been provided regarding the importance of this vaccine. Patient has been advised to call insurance company to determine out of pocket expense if they have not yet received this vaccine. Advised may also receive vaccine at local pharmacy or Health Dept. Verbalized acceptance and understanding.  Screening Tests Health Maintenance  Topic Date Due   Zoster Vaccines- Shingrix (2 of 2) 06/14/2020   COVID-19 Vaccine (4 - Booster for Pfizer series) 03/16/2021   TETANUS/TDAP  07/16/2022   INFLUENZA VACCINE  Completed   DEXA SCAN  Completed   HPV VACCINES  Aged Out    Health Maintenance  Health Maintenance Due  Topic Date Due   Zoster Vaccines- Shingrix (2 of 2) 06/14/2020   COVID-19 Vaccine (4 - Booster for Pfizer series) 03/16/2021    Colorectal cancer screening: No longer required.   Mammogram status: No longer required due to patient denied.  Bone density status: no longer required  Lung Cancer Screening: (Low Dose CT Chest recommended if Age 35-80 years, 30 pack-year currently smoking OR have quit w/in 15years.) does not qualify.   Lung Cancer Screening Referral: no  Additional Screening:  Hepatitis C Screening: does not qualify; Completed no  Vision Screening: Recommended annual ophthalmology exams for early detection of glaucoma and other disorders of the eye. Is the patient up to date with their annual eye exam?  Yes  Who is the provider or what is the name of the office in which the  patient attends annual eye exams? Rutherford Guys, MD. If pt is not established with a provider, would they like to be referred to a provider to establish care? No .   Dental  Screening: Recommended annual dental exams for proper oral hygiene  Community Resource Referral / Chronic Care Management: CRR required this visit?  No   CCM required this visit?  No      Plan:     I have personally reviewed and noted the following in the patient's chart:   Medical and social history Use of alcohol, tobacco or illicit drugs  Current medications and supplements including opioid prescriptions.  Functional ability and status Nutritional status Physical activity Advanced directives List of other physicians Hospitalizations, surgeries, and ER visits in previous 12 months Vitals Screenings to include cognitive, depression, and falls Referrals and appointments  In addition, I have reviewed and discussed with patient certain preventive protocols, quality metrics, and best practice recommendations. A written personalized care plan for preventive services as well as general preventive health recommendations were provided to patient.     Sheral Flow, LPN   [id number]   Nurse Notes:  Hearing Screening - Comments:: Patient denied any hearing difficulty.   No hearing aids.  Vision Screening - Comments:: Patient wears corrective glasses/contacts.  Eye exam done annually by: Rutherford Guys, MD.

## 2021-09-19 DIAGNOSIS — H1132 Conjunctival hemorrhage, left eye: Secondary | ICD-10-CM | POA: Diagnosis not present

## 2021-12-14 DIAGNOSIS — L570 Actinic keratosis: Secondary | ICD-10-CM | POA: Diagnosis not present

## 2021-12-14 DIAGNOSIS — Z85828 Personal history of other malignant neoplasm of skin: Secondary | ICD-10-CM | POA: Diagnosis not present

## 2021-12-14 DIAGNOSIS — Z08 Encounter for follow-up examination after completed treatment for malignant neoplasm: Secondary | ICD-10-CM | POA: Diagnosis not present

## 2021-12-14 DIAGNOSIS — B078 Other viral warts: Secondary | ICD-10-CM | POA: Diagnosis not present

## 2021-12-14 DIAGNOSIS — X32XXXD Exposure to sunlight, subsequent encounter: Secondary | ICD-10-CM | POA: Diagnosis not present

## 2022-01-14 DIAGNOSIS — Z961 Presence of intraocular lens: Secondary | ICD-10-CM | POA: Diagnosis not present

## 2022-02-07 ENCOUNTER — Other Ambulatory Visit: Payer: Self-pay | Admitting: Internal Medicine

## 2022-02-09 ENCOUNTER — Encounter: Payer: Self-pay | Admitting: Internal Medicine

## 2022-02-11 ENCOUNTER — Encounter: Payer: Self-pay | Admitting: Internal Medicine

## 2022-02-11 ENCOUNTER — Other Ambulatory Visit: Payer: Self-pay

## 2022-02-11 ENCOUNTER — Other Ambulatory Visit: Payer: Self-pay | Admitting: Internal Medicine

## 2022-02-11 ENCOUNTER — Other Ambulatory Visit (INDEPENDENT_AMBULATORY_CARE_PROVIDER_SITE_OTHER): Payer: Medicare Other

## 2022-02-11 DIAGNOSIS — N390 Urinary tract infection, site not specified: Secondary | ICD-10-CM

## 2022-02-11 DIAGNOSIS — N3000 Acute cystitis without hematuria: Secondary | ICD-10-CM | POA: Insufficient documentation

## 2022-02-11 LAB — URINALYSIS, ROUTINE W REFLEX MICROSCOPIC
Bilirubin Urine: NEGATIVE
Ketones, ur: NEGATIVE
Nitrite: POSITIVE — AB
RBC / HPF: NONE SEEN (ref 0–?)
Specific Gravity, Urine: 1.015 (ref 1.000–1.030)
Total Protein, Urine: 100 — AB
Urine Glucose: NEGATIVE
Urobilinogen, UA: 0.2 (ref 0.0–1.0)
pH: 6 (ref 5.0–8.0)

## 2022-02-11 MED ORDER — NITROFURANTOIN MONOHYD MACRO 100 MG PO CAPS: 100.0000 mg | ORAL_CAPSULE | Freq: Two times a day (BID) | ORAL | 0 refills | Status: AC

## 2022-02-13 LAB — CULTURE, URINE COMPREHENSIVE

## 2022-02-14 ENCOUNTER — Encounter: Payer: Self-pay | Admitting: Internal Medicine

## 2022-02-14 ENCOUNTER — Other Ambulatory Visit: Payer: Self-pay

## 2022-02-14 ENCOUNTER — Ambulatory Visit (INDEPENDENT_AMBULATORY_CARE_PROVIDER_SITE_OTHER): Payer: Medicare Other | Admitting: Internal Medicine

## 2022-02-14 VITALS — BP 136/72 | HR 79 | Temp 98.1°F | Ht 64.0 in | Wt 165.0 lb

## 2022-02-14 DIAGNOSIS — D500 Iron deficiency anemia secondary to blood loss (chronic): Secondary | ICD-10-CM

## 2022-02-14 DIAGNOSIS — N1832 Chronic kidney disease, stage 3b: Secondary | ICD-10-CM

## 2022-02-14 DIAGNOSIS — N3000 Acute cystitis without hematuria: Secondary | ICD-10-CM

## 2022-02-14 DIAGNOSIS — D599 Acquired hemolytic anemia, unspecified: Secondary | ICD-10-CM | POA: Diagnosis not present

## 2022-02-14 DIAGNOSIS — E60 Dietary zinc deficiency: Secondary | ICD-10-CM

## 2022-02-14 LAB — BASIC METABOLIC PANEL
BUN: 39 mg/dL — ABNORMAL HIGH (ref 6–23)
CO2: 27 mEq/L (ref 19–32)
Calcium: 9.3 mg/dL (ref 8.4–10.5)
Chloride: 108 mEq/L (ref 96–112)
Creatinine, Ser: 1.23 mg/dL — ABNORMAL HIGH (ref 0.40–1.20)
GFR: 39.4 mL/min — ABNORMAL LOW (ref >60.00)
Glucose, Bld: 97 mg/dL (ref 70–99)
Potassium: 4.4 mEq/L (ref 3.5–5.1)
Sodium: 142 mEq/L (ref 135–145)

## 2022-02-14 LAB — IBC + FERRITIN
Ferritin: 1064.4 ng/mL — ABNORMAL HIGH (ref 10.0–291.0)
Iron: 76 ug/dL (ref 42–145)
Saturation Ratios: 32.9 % (ref 20.0–50.0)
TIBC: 231 ug/dL — ABNORMAL LOW (ref 250.0–450.0)
Transferrin: 165 mg/dL — ABNORMAL LOW (ref 212.0–360.0)

## 2022-02-14 LAB — CBC WITH DIFFERENTIAL/PLATELET
Basophils Absolute: 0.1 10*3/uL (ref 0.0–0.1)
Basophils Relative: 0.6 % (ref 0.0–3.0)
Eosinophils Absolute: 0.2 10*3/uL (ref 0.0–0.7)
Eosinophils Relative: 2.2 % (ref 0.0–5.0)
HCT: 29.2 % — ABNORMAL LOW (ref 36.0–46.0)
Hemoglobin: 10.2 g/dL — ABNORMAL LOW (ref 12.0–15.0)
Lymphocytes Relative: 15.6 % (ref 12.0–46.0)
Lymphs Abs: 1.3 10*3/uL (ref 0.7–4.0)
MCHC: 34.8 g/dL (ref 30.0–36.0)
MCV: 88.4 fl (ref 78.0–100.0)
Monocytes Absolute: 0.8 10*3/uL (ref 0.1–1.0)
Monocytes Relative: 9 % (ref 3.0–12.0)
Neutro Abs: 6.2 10*3/uL (ref 1.4–7.7)
Neutrophils Relative %: 72.6 % (ref 43.0–77.0)
Platelets: 236 10*3/uL (ref 150.0–400.0)
RBC: 3.3 Mil/uL — ABNORMAL LOW (ref 3.87–5.11)
RDW: 20.1 % — ABNORMAL HIGH (ref 11.5–15.5)
WBC: 8.6 10*3/uL (ref 4.0–10.5)

## 2022-02-14 LAB — HEPATIC FUNCTION PANEL
ALT: 12 U/L (ref 0–35)
AST: 19 U/L (ref 0–37)
Albumin: 4.6 g/dL (ref 3.5–5.2)
Alkaline Phosphatase: 68 U/L (ref 39–117)
Bilirubin, Direct: 0.4 mg/dL — ABNORMAL HIGH (ref 0.0–0.3)
Total Bilirubin: 1.7 mg/dL — ABNORMAL HIGH (ref 0.2–1.2)
Total Protein: 6.7 g/dL (ref 6.0–8.3)

## 2022-02-14 NOTE — Progress Notes (Signed)
? ?Subjective:  ?Patient ID: Claudia Lara, female    DOB: 1932-09-25  Age: 88 y.o. MRN: [id number] ? ?CC: Anemia and Urinary Tract Infection ? ?This visit occurred during the SARS-CoV-2 public health emergency.  Safety protocols were in place, including screening questions prior to the visit, additional usage of staff PPE, and extensive cleaning of exam room while observing appropriate contact time as indicated for disinfecting solutions.   ? ?HPI ?Claudia Lara presents for f/up - ? ?She called the office about 2 days ago complaining of UTI symptoms.  Her UA was abnormal and her urine culture was positive for E. coli.  She has taken nitrofurantoin for 48 hours and says the UTI symptoms have resolved.  She is active and denies chest pain, shortness of breath, diaphoresis, dizziness, lightheadedness, edema, or fatigue. ? ?Outpatient Medications Prior to Visit  ?Medication Sig Dispense Refill  ? Calcium Carb-Cholecalciferol (CALCIUM CARBONATE-VITAMIN D3 PO) Take 1 tablet by mouth daily.     ? carvedilol (COREG) 12.5 MG tablet TAKE 1 TABLET BY MOUTH 2 TIMES DAILY WITH A MEAL 180 tablet 0  ? cholecalciferol (VITAMIN D) 1000 units tablet Take 1,000 Units by mouth daily.    ? methenamine (HIPREX) 1 g tablet Take 1 tablet (1 g total) by mouth 2 (two) times daily with a meal. 180 tablet 1  ? Multiple Vitamin (MULTIVITAMIN WITH MINERALS) TABS tablet Take 1 tablet by mouth daily.    ? Multiple Vitamins-Minerals (ZINC PO) Take by mouth.    ? nitrofurantoin, macrocrystal-monohydrate, (MACROBID) 100 MG capsule Take 1 capsule (100 mg total) by mouth 2 (two) times daily for 5 days. 10 capsule 0  ? torsemide (DEMADEX) 20 MG tablet Take 20 mg by mouth daily as needed.    ? traMADol (ULTRAM) 50 MG tablet Take 50 mg by mouth as needed.    ? vitamin C (ASCORBIC ACID) 500 MG tablet Take 500 mg by mouth daily.    ? ?No facility-administered medications prior to visit.  ? ? ?ROS ?Review of Systems  ?Constitutional:  Negative for chills,  diaphoresis, fatigue and fever.  ?HENT: Negative.    ?Eyes: Negative.   ?Respiratory:  Negative for chest tightness, shortness of breath and wheezing.   ?Cardiovascular:  Negative for chest pain, palpitations and leg swelling.  ?Gastrointestinal:  Negative for abdominal pain, diarrhea and nausea.  ?Endocrine: Negative.   ?Genitourinary: Negative.  Negative for difficulty urinating, dysuria, flank pain, hematuria and urgency.  ?Musculoskeletal: Negative.   ?Skin: Negative.   ?Neurological:  Negative for dizziness, weakness, light-headedness and headaches.  ?Hematological:  Negative for adenopathy. Does not bruise/bleed easily.  ?Psychiatric/Behavioral: Negative.    ? ?Objective:  ?BP 136/72 (BP Location: Left Arm, Patient Position: Sitting, Cuff Size: Large)   Pulse 79   Temp 98.1 ?F (36.7 ?C) (Oral)   Ht '5\' 4"'$  (1.626 m)   Wt 165 lb (74.8 kg)   SpO2 97%   BMI 28.32 kg/m?  ? ?BP Readings from Last 3 Encounters:  ?02/14/22 136/72  ?09/06/21 120/60  ?07/19/21 (!) 140/52  ? ? ?Wt Readings from Last 3 Encounters:  ?02/14/22 165 lb (74.8 kg)  ?09/06/21 166 lb 12.8 oz (75.7 kg)  ?07/25/21 166 lb (75.3 kg)  ? ? ?Physical Exam ?Vitals reviewed.  ?Constitutional:   ?   Appearance: She is not ill-appearing.  ?HENT:  ?   Nose: Nose normal.  ?   Mouth/Throat:  ?   Mouth: Mucous membranes are moist.  ?Eyes:  ?  General: No scleral icterus. ?   Conjunctiva/sclera: Conjunctivae normal.  ?Cardiovascular:  ?   Rate and Rhythm: Normal rate and regular rhythm.  ?   Heart sounds: Murmur heard.  ?Systolic murmur is present with a grade of 2/6.  ?  No friction rub. No gallop.  ?   Comments: Harsh 2/6 SEM RUSB ?Pulmonary:  ?   Effort: Pulmonary effort is normal.  ?   Breath sounds: No stridor. No wheezing, rhonchi or rales.  ?Abdominal:  ?   General: Abdomen is flat.  ?   Palpations: There is no mass.  ?   Tenderness: There is no abdominal tenderness. There is no guarding.  ?   Hernia: No hernia is present.  ?Musculoskeletal:     ?    General: Normal range of motion.  ?   Cervical back: Neck supple.  ?   Right lower leg: No edema.  ?   Left lower leg: No edema.  ?Lymphadenopathy:  ?   Cervical: No cervical adenopathy.  ?Skin: ?   General: Skin is warm and dry.  ?   Coloration: Skin is pale.  ?Neurological:  ?   General: No focal deficit present.  ?   Mental Status: She is alert.  ? ? ?Lab Results  ?Component Value Date  ? WBC 8.6 02/14/2022  ? HGB 10.2 (L) 02/14/2022  ? HCT 29.2 (L) 02/14/2022  ? PLT 236.0 02/14/2022  ? GLUCOSE 97 02/14/2022  ? CHOL 108 05/17/2020  ? TRIG 69.0 05/17/2020  ? HDL 51.10 05/17/2020  ? LDLDIRECT 56.0 07/08/2018  ? [place] 43 05/17/2020  ? ALT 12 02/14/2022  ? AST 19 02/14/2022  ? NA 142 02/14/2022  ? K 4.4 02/14/2022  ? CL 108 02/14/2022  ? CREATININE 1.23 (H) 02/14/2022  ? BUN 39 (H) 02/14/2022  ? CO2 27 02/14/2022  ? TSH 1.16 05/17/2020  ? INR 1.1 (H) 05/17/2020  ? HGBA1C 4.8 06/08/2019  ? ? ?MYOCARDIAL PERFUSION IMAGING ? ?Result Date: 03/07/2021 ?? The left ventricular ejection fraction is normal (55-65%). ? Nuclear stress EF: 65%. ? There was no ST segment deviation noted during stress. ? The study is normal. ? This is a low risk study.  No ischemia or infarction on perfusion images. Normal wall motion.  ? ? ?Assessment & Plan:  ? ?Claudia Lara was seen today for anemia and urinary tract infection. ? ?Diagnoses and all orders for this visit: ? ?Stage 3b chronic kidney disease ([place])- Her renal function has improved. ?-     Basic metabolic panel; Future ?-     Basic metabolic panel ? ?Zinc deficiency- H/H are improved. ?-     CBC with Differential/Platelet; Future ?-     CBC with Differential/Platelet ? ?Iron deficiency anemia due to chronic blood loss- H/H are improved. Iron is normal. ?-     IBC + Ferritin; Future ?-     CBC with Differential/Platelet; Future ?-     CBC with Differential/Platelet ?-     IBC + Ferritin ? ?Acquired hemolytic anemia ([place])- H/H have improved. Labs are negative for hemolysis. ?-     CBC with  Differential/Platelet; Future ?-     Lactate dehydrogenase; Future ?-     Hepatic function panel; Future ?-     Hepatic function panel ?-     Lactate dehydrogenase ?-     CBC with Differential/Platelet ? ?Acute cystitis without hematuria- Improvement noted ? ? ?I am having Claudia Lara maintain her vitamin C, cholecalciferol,  Calcium Carb-Cholecalciferol (CALCIUM CARBONATE-VITAMIN D3 PO), multivitamin with minerals, torsemide, Multiple Vitamins-Minerals (ZINC PO), traMADol, methenamine, carvedilol, and nitrofurantoin (macrocrystal-monohydrate). ? ?No orders of the defined types were placed in this encounter. ? ? ? ?Follow-up: Return in about 6 months (around 08/17/2022). ? ?[name], MD ?

## 2022-02-14 NOTE — Patient Instructions (Signed)
Iron Deficiency Anemia, Adult Iron deficiency anemia is a condition in which the concentration of red blood cells or hemoglobin in the blood is below normal because of too little iron. Hemoglobin is a substance in red blood cells that carries oxygen to the body's tissues. When the concentration of red blood cells or hemoglobin is too low, not enough oxygen reaches these tissues. Iron deficiency anemia is usually long-lasting, and it develops over time. It may or may not cause symptoms. It is a common type of anemia. What are the causes? This condition may be caused by: Not enough iron in the diet. Abnormal absorption in the gut. Increased need for iron because of pregnancy or heavy menstrual periods, for females. Cancers of the gastrointestinal system, such as colon cancer. Blood loss caused by bleeding in the intestine. This may be from a gastrointestinal condition like Crohn's disease. Frequent blood draws, such as from blood donation. What increases the risk? The following factors may make you more likely to develop this condition: Being pregnant. Being a teenage girl going through a growth spurt. What are the signs or symptoms? Symptoms of this condition may include: Pale skin, lips, and nail beds. Weakness, dizziness, and getting tired easily. Headache. Shortness of breath when moving or exercising. Cold hands and feet. Fast or irregular heartbeat. Irritability or rapid breathing. These are more common in severe anemia. Mild anemia may not cause any symptoms. How is this diagnosed? This condition is diagnosed based on: Your medical history. A physical exam. Blood tests. You may have additional tests to find the underlying cause of your anemia, such as: Testing for blood in the stool (fecal occult blood test). A procedure to see inside your colon and rectum (colonoscopy). A procedure to see inside your esophagus and stomach (endoscopy). A test in which cells are removed from  bone marrow (bone marrow aspiration) or fluid is removed from the bone marrow to be examined. This is rarely needed. How is this treated? This condition is treated by correcting the cause of your iron deficiency. Treatment may involve: Adding iron-rich foods to your diet. Taking iron supplements. If you are pregnant or breastfeeding, you may need to take extra iron because your normal diet usually does not provide the amount of iron that you need. Increasing vitamin C intake. Vitamin C helps your body absorb iron. Your health care provider may recommend that you take iron supplements along with a glass of orange juice or a vitamin C supplement. Medicines to make heavy menstrual flow lighter. Surgery. You may need repeat blood tests to determine whether treatment is working. If the treatment does not seem to be working, you may need more tests. Follow these instructions at home: Medicines Take over-the-counter and prescription medicines only as told by your health care provider. This includes iron supplements and vitamins. For the best iron absorption, you should take iron supplements when your stomach is empty. If you cannot tolerate them on an empty stomach, you may need to take them with food. Do not drink milk or take antacids at the same time as your iron supplements. Milk and antacids may interfere with iron absorption. Iron supplements may turn stool (feces) a darker color and it may appear black. If you cannot tolerate taking iron supplements by mouth, talk with your health care provider about taking them through an IV or through an injection into a muscle. Eating and drinking  Talk with your health care provider before changing your diet. He or she may recommend   that you eat foods that contain a lot of iron, such as: Liver. Low-fat (lean) beef. Breads and cereals that have iron added to them (are fortified). Eggs. Dried fruit. Dark green, leafy vegetables. To help your body use the  iron from iron-rich foods, eat those foods at the same time as fresh fruits and vegetables that are high in vitamin C. Foods that are high in vitamin C include: Oranges. Peppers. Tomatoes. Mangoes. Drink enough fluid to keep your urine pale yellow. Managing constipation If you are taking an iron supplement, it may cause constipation. To prevent or treat constipation, you may need to: Take over-the-counter or prescription medicines. Eat foods that are high in fiber, such as beans, whole grains, and fresh fruits and vegetables. Limit foods that are high in fat and processed sugars, such as fried or sweet foods. General instructions Return to your normal activities as told by your health care provider. Ask your health care provider what activities are safe for you. Practice good hygiene. Anemia can make you more prone to illness and infection. Keep all follow-up visits as told by your health care provider. This is important. Contact a health care provider if you: Feel nauseous or you vomit. Feel weak. Have unexplained sweating. Develop symptoms of constipation, such as: Having fewer than three bowel movements a week. Straining to have a bowel movement. Having stools that are hard, dry, or larger than normal. Feeling full or bloated. Pain in the lower abdomen. Not feeling relief after having a bowel movement. Get help right away if you: Faint. If this happens, do not drive yourself to the hospital. Have chest pain. Have shortness of breath that: Is severe. Gets worse with physical activity. Have an irregular or rapid heartbeat. Become light-headed when getting up from a sitting or lying down position. These symptoms may represent a serious problem that is an emergency. Do not wait to see if the symptoms will go away. Get medical help right away. Call your local emergency services (911 in the [place] Do not drive yourself to the hospital. Summary Iron deficiency anemia is a condition in  which the concentration of red blood cells or hemoglobin in the blood is below normal because of too little iron. This condition is treated by correcting the cause of your iron deficiency. Take over-the-counter and prescription medicines only as told by your health care provider. This includes iron supplements and vitamins. To help your body use the iron from iron-rich foods, eat those foods at the same time as fresh fruits and vegetables that are high in vitamin C. Get help right away if you have shortness of breath that gets worse with physical activity. This information is not intended to replace advice given to you by your health care provider. Make sure you discuss any questions you have with your health care provider. Document Revised: 07/20/2019 Document Reviewed: 07/20/2019 Elsevier Patient Education  2022 Elsevier Inc.  

## 2022-02-15 LAB — LACTATE DEHYDROGENASE: LDH: 170 U/L (ref 120–250)

## 2022-02-17 ENCOUNTER — Encounter: Payer: Self-pay | Admitting: Internal Medicine

## 2022-02-18 ENCOUNTER — Other Ambulatory Visit: Payer: Self-pay | Admitting: Internal Medicine

## 2022-02-18 DIAGNOSIS — N390 Urinary tract infection, site not specified: Secondary | ICD-10-CM

## 2022-02-18 MED ORDER — METHENAMINE HIPPURATE 1 G PO TABS: 1.0000 g | ORAL_TABLET | Freq: Two times a day (BID) | ORAL | 1 refills | Status: DC

## 2022-05-09 ENCOUNTER — Other Ambulatory Visit: Payer: Self-pay | Admitting: Internal Medicine

## 2022-05-09 DIAGNOSIS — I5189 Other ill-defined heart diseases: Secondary | ICD-10-CM

## 2022-05-09 DIAGNOSIS — I10 Essential (primary) hypertension: Secondary | ICD-10-CM

## 2022-06-18 ENCOUNTER — Ambulatory Visit: Payer: Medicare Other | Admitting: Orthopaedic Surgery

## 2022-06-18 ENCOUNTER — Encounter: Payer: Self-pay | Admitting: Orthopaedic Surgery

## 2022-06-18 ENCOUNTER — Ambulatory Visit (INDEPENDENT_AMBULATORY_CARE_PROVIDER_SITE_OTHER): Payer: Medicare Other

## 2022-06-18 DIAGNOSIS — M25551 Pain in right hip: Secondary | ICD-10-CM | POA: Insufficient documentation

## 2022-06-18 DIAGNOSIS — Z96641 Presence of right artificial hip joint: Secondary | ICD-10-CM

## 2022-06-18 NOTE — Progress Notes (Signed)
Office Visit Note   Patient: Claudia Lara           Date of Birth: [birth year]           MRN: [id number] Visit Date: 06/18/2022              Requested by: Janith Lima, MD [address] [place],  [place] [address] PCP: Janith Lima, MD   Assessment & Plan: Visit Diagnoses:  1. History of right hip replacement   2. Pain in right hip     Plan: Claudia Lara is 5 years status post primary right total hip replacement and has done very very well without any problems until just recently when she on occasion would notice some pain along the lateral aspect of her right hip.  She denies any groin or anterior thigh pain.  She has not had any injury or trauma.  She is very active around the house and in her garden.  Pain is localized over the greater trochanter pain was relatively mild.  There is no pain with motion of her hip.  X-rays noted the replacement to be in very good position.  I think she is having greater trochanteric bursitis or pain related to the gluteus muscles.  Have discussed cortisone injection which she would like to avoid and even use of Voltaren gel.  She was reassured and we will plan to see her back as needed.  All questions were answered  Follow-Up Instructions: Return if symptoms worsen or fail to improve.   Orders:  Orders Placed This Encounter  Procedures   XR HIP UNILAT W OR W/O PELVIS 1V RIGHT   No orders of the defined types were placed in this encounter.     Procedures: No procedures performed   Clinical Data: No additional findings.   Subjective: Chief Complaint  Patient presents with   Right Hip - Pain  Claudia Lara is 5 years status post primary right total hip replacement and doing quite well.  She had some limitation of crossing her right leg over her left even before surgery and that has not changed but she notes other than that she has not had any problems.  Recently she has experienced a little bit of pain over the outside of her right hip  but denies any groin or thigh pain.  She had a friend who had a problem with his hip replacement and she is concerned that she may have a problem as well  HPI  Review of Systems   Objective: Vital Signs: There were no vitals taken for this visit.  Physical Exam Constitutional:      Appearance: She is well-developed.  Pulmonary:     Effort: Pulmonary effort is normal.  Skin:    General: Skin is warm and dry.  Neurological:     Mental Status: She is alert and oriented to person, place, and time.  Psychiatric:        Behavior: Behavior normal.     Ortho Exam awake alert and oriented x3.  Comfortable sitting.  No use of ambulatory aid.  Walks without a limp.  Painless range of motion of right compared to left hip and the did not appear to be any loss of motion.  There was tenderness directly over the greater trochanter.  No crepitation.  Pain was relatively mild.  Straight leg raise negative.  No percussible tenderness of lumbar spine  Specialty Comments:  No specialty comments available.  Imaging: XR HIP UNILAT  W OR W/O PELVIS 1V RIGHT  Result Date: 06/18/2022 AP lateral of the left hip were obtained and compared to films that were performed in 2018 after her total hip replacement.  No acute changes.  Femoral head is centered in the acetabulum without evidence of obvious wear.  There is some ectopic calcification within the capsule but stable from old films.  Patient is having mostly pain over the greater trochanter but without any obvious acute changes.    PMFS History: Patient Active Problem List   Diagnosis Date Noted   Pain in right hip 06/18/2022   Acute cystitis without hematuria 02/11/2022   Encounter for general adult medical examination with abnormal findings 07/22/2021   Recurrent UTI (urinary tract infection) 07/13/2021   Estrogen deficiency 01/22/2021   OAB (overactive bladder) 11/01/2020   Carpal tunnel syndrome, left upper limb 04/05/2020   Iron deficiency  anemia due to chronic blood loss 09/08/2019   Interstitial pulmonary disease ([place]) 09/08/2019   Lower leg DVT (deep venous thromboembolism), acute, left ([place]) 08/18/2019   OA (osteoarthritis) of knee 07/26/2019   DDD (degenerative disc disease), cervical 04/16/2019   Zinc deficiency 10/03/2017   Spondylosis, lumbar, with myelopathy 04/09/2017   GERD (gastroesophageal reflux disease) 08/03/2015   Hemolytic anemia ([place]) 07/12/2015   Chronic venous insufficiency 08/10/2014   Aortic stenosis, mild 06/29/2014   Left ventricular diastolic dysfunction, NYHA class 1 06/29/2014   Senile osteopenia 04/21/2014   Hyperlipidemia with target LDL less than 130 04/21/2014   Kidney disease, chronic, stage III (GFR 30-59 ml/min) (HCC) 04/21/2014   Carotid artery stenosis without cerebral infarction 01/16/2010   Essential hypertension 10/19/2007   Past Medical History:  Diagnosis Date   ANEMIA 08/22/2009   Aortic stenosis    AR (aortic regurgitation) 01/2019   Mild to Moderate, Noted on ECHO   CAROTID ARTERY STENOSIS 05/23/4764   Complication of anesthesia    very hard to wake up from surgery   DYSPNEA ON EXERTION 11/16/2007   Gallstones    GEN OSTEOARTHROSIS INVOLVING MULTIPLE SITES 11/11/2008   GERD (gastroesophageal reflux disease)    Grade I diastolic dysfunction 46/5035   Noted on ECHO   Heart murmur    HEMORRHOIDS, INTERNAL    surgery was in the 90's (per patient)   History of blood transfusion    after hip and knee replacements   NECK PAIN, ACUTE 12/28/2009   PERIPHERAL EDEMA 10/06/2007   PONV (postoperative nausea and vomiting)    Pulmonary nodule 06/18/2019   noted on CT Chest    PVD 10/06/2007   Unspecified essential hypertension 10/19/2007    Family History  Problem Relation Age of Onset   Ovarian cancer Mother    Cancer Mother    Leukemia Father    Lung cancer Father    Cancer Father    Cancer Brother    Diabetes Neg Hx    Heart disease Neg Hx    Hypertension Neg Hx      Past Surgical History:  Procedure Laterality Date   ABDOMINAL HYSTERECTOMY     BIOPSY  08/06/2019   Procedure: BIOPSY;  Surgeon: Gatha Mayer, MD;  Location: [facility];  Service: Gastroenterology;;   [name]     bilat   CARPAL TUNNEL RELEASE     CHOLECYSTECTOMY  01/29/2016   at [place] (ERCP) WITH PROPOFOL N/A 12/01/2015   Procedure: ENDOSCOPIC RETROGRADE CHOLANGIOPANCREATOGRAPHY (ERCP) WITH PROPOFOL;  Surgeon: [name],  MD;  Location: [facility];  Service: Endoscopy;  Laterality: N/A;   ENDOSCOPIC RETROGRADE CHOLANGIOPANCREATOGRAPHY (ERCP) WITH PROPOFOL N/A 02/01/2016   Procedure: ENDOSCOPIC RETROGRADE CHOLANGIOPANCREATOGRAPHY (ERCP) WITH PROPOFOL;  Surgeon: [name], MD;  Location: [facility];  Service: Endoscopy;  Laterality: N/A;   ENDOSCOPIC RETROGRADE CHOLANGIOPANCREATOGRAPHY (ERCP) WITH PROPOFOL N/A 04/07/2018   Procedure: ENDOSCOPIC RETROGRADE CHOLANGIOPANCREATOGRAPHY (ERCP) WITH PROPOFOL;  Surgeon: [name], MD;  Location: [facility];  Service: Endoscopy;  Laterality: N/A;   ESOPHAGOGASTRODUODENOSCOPY (EGD) WITH PROPOFOL N/A 08/06/2019   Procedure: ESOPHAGOGASTRODUODENOSCOPY (EGD) WITH PROPOFOL;  Surgeon: Gatha Mayer, MD;  Location: [facility];  Service: Gastroenterology;  Laterality: N/A;   HAMMER TOE SURGERY     HEMORRHOID SURGERY     Hyperplastic colon polyps, removed  2007   By Dr. Penelope Coop   JOINT REPLACEMENT  2011   right knee   OOPHORECTOMY     REMOVAL OF STONES  04/07/2018   Procedure: REMOVAL OF STONES;  Surgeon: [name], MD;  Location: [facility];  Service: Endoscopy;;   ROTATOR CUFF REPAIR  2004   left (Dr. Durward Fortes)   [place]  04/07/2018   Procedure: Joan Mayans;  Surgeon: [name], MD;  Location: [facility];  Service: Endoscopy;;   TOTAL HIP ARTHROPLASTY Right 11/26/2016   Procedure: RIGHT TOTAL HIP  ARTHROPLASTY;  Surgeon: Garald Balding, MD;  Location: [facility];  Service: Orthopedics;  Laterality: Right;   TOTAL KNEE ARTHROPLASTY Right    TOTAL KNEE ARTHROPLASTY Left 07/26/2019   Procedure: TOTAL KNEE ARTHROPLASTY;  Surgeon: Gaynelle Arabian, MD;  Location: [facility];  Service: Orthopedics;  Laterality: Left;  31mn   Social History   Occupational History   Occupation: sProducer, television/film/video RETIRED    Comment: retired  Tobacco Use   Smoking status: Never   Smokeless tobacco: Never  Vaping Use   Vaping Use: Never used  Substance and Sexual Activity   Alcohol use: No   Drug use: No   Sexual activity: Not Currently    Birth control/protection: Surgical     PGarald Balding MD   Note - This record has been created using DEditor, commissioning  Chart creation errors have been sought, but may not always  have been located. Such creation errors do not reflect on  the standard of medical care.

## 2022-06-26 DIAGNOSIS — B078 Other viral warts: Secondary | ICD-10-CM | POA: Diagnosis not present

## 2022-06-26 DIAGNOSIS — L298 Other pruritus: Secondary | ICD-10-CM | POA: Diagnosis not present

## 2022-06-26 DIAGNOSIS — L570 Actinic keratosis: Secondary | ICD-10-CM | POA: Diagnosis not present

## 2022-06-26 DIAGNOSIS — L820 Inflamed seborrheic keratosis: Secondary | ICD-10-CM | POA: Diagnosis not present

## 2022-06-26 DIAGNOSIS — X32XXXD Exposure to sunlight, subsequent encounter: Secondary | ICD-10-CM | POA: Diagnosis not present

## 2022-08-05 ENCOUNTER — Ambulatory Visit: Payer: Medicare Other

## 2022-08-07 ENCOUNTER — Encounter: Payer: Self-pay | Admitting: Internal Medicine

## 2022-08-07 ENCOUNTER — Ambulatory Visit (INDEPENDENT_AMBULATORY_CARE_PROVIDER_SITE_OTHER): Payer: Medicare Other | Admitting: Internal Medicine

## 2022-08-07 VITALS — BP 138/60 | HR 69 | Temp 97.6°F | Ht 64.0 in | Wt 169.0 lb

## 2022-08-07 DIAGNOSIS — R202 Paresthesia of skin: Secondary | ICD-10-CM | POA: Insufficient documentation

## 2022-08-07 DIAGNOSIS — Z0001 Encounter for general adult medical examination with abnormal findings: Secondary | ICD-10-CM

## 2022-08-07 DIAGNOSIS — E60 Dietary zinc deficiency: Secondary | ICD-10-CM

## 2022-08-07 DIAGNOSIS — N1832 Chronic kidney disease, stage 3b: Secondary | ICD-10-CM | POA: Diagnosis not present

## 2022-08-07 DIAGNOSIS — I10 Essential (primary) hypertension: Secondary | ICD-10-CM | POA: Diagnosis not present

## 2022-08-07 DIAGNOSIS — I5189 Other ill-defined heart diseases: Secondary | ICD-10-CM

## 2022-08-07 DIAGNOSIS — Z23 Encounter for immunization: Secondary | ICD-10-CM | POA: Diagnosis not present

## 2022-08-07 DIAGNOSIS — D539 Nutritional anemia, unspecified: Secondary | ICD-10-CM | POA: Insufficient documentation

## 2022-08-07 DIAGNOSIS — D599 Acquired hemolytic anemia, unspecified: Secondary | ICD-10-CM | POA: Diagnosis not present

## 2022-08-07 DIAGNOSIS — D500 Iron deficiency anemia secondary to blood loss (chronic): Secondary | ICD-10-CM

## 2022-08-07 LAB — FOLATE: Folate: >23.9 ng/mL (ref >5.9)

## 2022-08-07 LAB — CBC WITH DIFFERENTIAL/PLATELET
Basophils Absolute: 0.1 10*3/uL (ref 0.0–0.1)
Basophils Relative: 0.8 % (ref 0.0–3.0)
Eosinophils Absolute: 0.2 10*3/uL (ref 0.0–0.7)
Eosinophils Relative: 2.3 % (ref 0.0–5.0)
HCT: 28.4 % — ABNORMAL LOW (ref 36.0–46.0)
Hemoglobin: 10.1 g/dL — ABNORMAL LOW (ref 12.0–15.0)
Lymphocytes Relative: 15.1 % (ref 12.0–46.0)
Lymphs Abs: 1.2 10*3/uL (ref 0.7–4.0)
MCHC: 35.4 g/dL (ref 30.0–36.0)
MCV: 88 fl (ref 78.0–100.0)
Monocytes Absolute: 0.7 10*3/uL (ref 0.1–1.0)
Monocytes Relative: 9.2 % (ref 3.0–12.0)
Neutro Abs: 5.8 10*3/uL (ref 1.4–7.7)
Neutrophils Relative %: 72.6 % (ref 43.0–77.0)
Platelets: 209 10*3/uL (ref 150.0–400.0)
RBC: 3.23 Mil/uL — ABNORMAL LOW (ref 3.87–5.11)
RDW: 20.7 % — ABNORMAL HIGH (ref 11.5–15.5)
WBC: 8.1 10*3/uL (ref 4.0–10.5)

## 2022-08-07 LAB — BASIC METABOLIC PANEL
BUN: 33 mg/dL — ABNORMAL HIGH (ref 6–23)
CO2: 23 mEq/L (ref 19–32)
Calcium: 9.1 mg/dL (ref 8.4–10.5)
Chloride: 107 mEq/L (ref 96–112)
Creatinine, Ser: 1.19 mg/dL (ref 0.40–1.20)
GFR: 40.86 mL/min — ABNORMAL LOW (ref >60.00)
Glucose, Bld: 97 mg/dL (ref 70–99)
Potassium: 4.3 mEq/L (ref 3.5–5.1)
Sodium: 139 mEq/L (ref 135–145)

## 2022-08-07 LAB — IBC + FERRITIN
Ferritin: 876.4 ng/mL — ABNORMAL HIGH (ref 10.0–291.0)
Iron: 92 ug/dL (ref 42–145)
Saturation Ratios: 42.7 % (ref 20.0–50.0)
TIBC: 215.6 ug/dL — ABNORMAL LOW (ref 250.0–450.0)
Transferrin: 154 mg/dL — ABNORMAL LOW (ref 212.0–360.0)

## 2022-08-07 LAB — VITAMIN B12: Vitamin B-12: 295 pg/mL (ref 211–911)

## 2022-08-07 MED ORDER — TORSEMIDE 20 MG PO TABS: 20.0000 mg | ORAL_TABLET | Freq: Every day | ORAL | 0 refills | Status: DC

## 2022-08-07 NOTE — Patient Instructions (Signed)

## 2022-08-07 NOTE — Progress Notes (Signed)
Subjective:  Patient ID: Claudia Lara, female    DOB: 07-27-1932  Age: 88 y.o. MRN: [id number]  CC: Annual Exam, Hypertension, and Congestive Heart Failure   HPI Claudia Lara presents for a CPX and f/up -  She complains of a 6 week hx of tingling in BUE's.  She mows her own grass and does not experience chest pain, shortness of breath, diaphoresis, or edema.  Outpatient Medications Prior to Visit  Medication Sig Dispense Refill   Calcium Carb-Cholecalciferol (CALCIUM CARBONATE-VITAMIN D3 PO) Take 1 tablet by mouth daily.      carvedilol (COREG) 12.5 MG tablet TAKE 1 TABLET BY MOUTH TWICE A DAY WITH MEALS 180 tablet 0   cholecalciferol (VITAMIN D) 1000 units tablet Take 1,000 Units by mouth daily.     methenamine (HIPREX) 1 g tablet Take 1 tablet (1 g total) by mouth 2 (two) times daily with a meal. 180 tablet 1   Multiple Vitamin (MULTIVITAMIN WITH MINERALS) TABS tablet Take 1 tablet by mouth daily.     Multiple Vitamins-Minerals (ZINC PO) Take by mouth.     traMADol (ULTRAM) 50 MG tablet Take 50 mg by mouth as needed.     vitamin C (ASCORBIC ACID) 500 MG tablet Take 500 mg by mouth daily.     torsemide (DEMADEX) 20 MG tablet Take 20 mg by mouth daily as needed.     No facility-administered medications prior to visit.    ROS Review of Systems  Respiratory:  Negative for cough, chest tightness, shortness of breath and wheezing.   Cardiovascular:  Negative for chest pain, palpitations and leg swelling.  Gastrointestinal:  Negative for abdominal pain.  Genitourinary: Negative.  Negative for difficulty urinating.  Musculoskeletal:  Positive for neck pain.  Neurological: Negative.  Negative for weakness, numbness and headaches.  Hematological:  Negative for adenopathy. Does not bruise/bleed easily.    Objective:  BP 138/60 (BP Location: Left Arm, Patient Position: Sitting, Cuff Size: Large)   Pulse 69   Temp 97.6 F (36.4 C) (Oral)   Ht '5\' 4"'$  (1.626 m)   Wt 169 lb (76.7  kg)   SpO2 94%   BMI 29.01 kg/m   BP Readings from Last 3 Encounters:  08/07/22 138/60  02/14/22 136/72  09/06/21 120/60    Wt Readings from Last 3 Encounters:  08/07/22 169 lb (76.7 kg)  02/14/22 165 lb (74.8 kg)  09/06/21 166 lb 12.8 oz (75.7 kg)    Physical Exam Cardiovascular:     Rate and Rhythm: Normal rate and regular rhythm.     Heart sounds: S1 normal and S2 normal. Murmur heard.     Systolic murmur is present with a grade of 3/6.     No diastolic murmur is present.  Musculoskeletal:     Right lower leg: 1+ Pitting Edema present.     Left lower leg: 1+ Pitting Edema present.  Neurological:     General: No focal deficit present.     Cranial Nerves: Cranial nerves 2-12 are intact.     Sensory: Sensation is intact.     Motor: Motor function is intact. No weakness or atrophy.     Coordination: Coordination is intact. Romberg sign negative. Coordination normal.     Gait: Gait is intact. Gait and tandem walk normal.     Deep Tendon Reflexes: Reflexes normal.     Reflex Scores:      Tricep reflexes are 0 on the right side and 0 on the left side.  Bicep reflexes are 0 on the right side and 0 on the left side.      Brachioradialis reflexes are 0 on the right side and 0 on the left side.      Patellar reflexes are 0 on the right side.      Achilles reflexes are 0 on the right side and 0 on the left side.    Lab Results  Component Value Date   WBC 8.1 08/07/2022   HGB 10.1 (L) 08/07/2022   HCT 28.4 (L) 08/07/2022   PLT 209.0 08/07/2022   GLUCOSE 97 08/07/2022   CHOL 108 05/17/2020   TRIG 69.0 05/17/2020   HDL 51.10 05/17/2020   LDLDIRECT 56.0 07/08/2018   LDLCALC 43 05/17/2020   ALT 12 02/14/2022   AST 19 02/14/2022   NA 139 08/07/2022   K 4.3 08/07/2022   CL 107 08/07/2022   CREATININE 1.19 08/07/2022   BUN 33 (H) 08/07/2022   CO2 23 08/07/2022   TSH 1.16 05/17/2020   INR 1.1 (H) 05/17/2020   HGBA1C 4.8 06/08/2019    MYOCARDIAL PERFUSION  IMAGING  Result Date: 03/07/2021  The left ventricular ejection fraction is normal (55-65%).  Nuclear stress EF: 65%.  There was no ST segment deviation noted during stress.  The study is normal.  This is a low risk study.  No ischemia or infarction on perfusion images. Normal wall motion.    Assessment & Plan:   [name] was seen today for annual exam, hypertension and congestive heart failure.  Diagnoses and all orders for this visit:  Flu vaccine need -     Flu Vaccine QUAD High Dose(Fluad)  Essential hypertension- Her blood pressure is adequately well controlled. -     torsemide (DEMADEX) 20 MG tablet; Take 1 tablet (20 mg total) by mouth daily. -     Basic metabolic panel; Future -     Basic metabolic panel  Left ventricular diastolic dysfunction, NYHA class 1- Will continue the loop diuretic. -     torsemide (DEMADEX) 20 MG tablet; Take 1 tablet (20 mg total) by mouth daily.  Deficiency anemia- Her H&H are stable and there are no vitamin deficiencies. -     Vitamin B12; Future -     Vitamin B1; Future -     Zinc; Future -     IBC + Ferritin; Future -     Folate; Future -     CBC with Differential/Platelet; Future -     Reticulocytes; Future -     Reticulocytes -     CBC with Differential/Platelet -     Folate -     IBC + Ferritin -     Zinc -     Vitamin B1 -     Vitamin B12  Tingling of both upper extremities- I do not see a secondary cause for this.  If her symptoms persist then will refer to neurology. -     Vitamin B12; Future -     Vitamin B1; Future -     Folate; Future -     Folate -     Vitamin B1 -     Vitamin B12  Encounter for general adult medical examination with abnormal findings- Exam completed, labs reviewed, vaccines are up-to-date, no cancer screenings indicated, patient education was given.  Stage 3b chronic kidney disease ([place])- Her renal function is stable. -     Basic metabolic panel; Future -     Basic metabolic panel  Zinc  deficiency-her zinc is normal now.  Iron deficiency anemia due to chronic blood loss-her iron level is normal now.  Acquired hemolytic anemia (HCC)-her H&H are stable.   I have changed [name]. Friedlander's torsemide. I am also having her maintain her ascorbic acid, cholecalciferol, Calcium Carb-Cholecalciferol (CALCIUM CARBONATE-VITAMIN D3 PO), multivitamin with minerals, Multiple Vitamins-Minerals (ZINC PO), traMADol, methenamine, and carvedilol.  Meds ordered this encounter  Medications   torsemide (DEMADEX) 20 MG tablet    Sig: Take 1 tablet (20 mg total) by mouth daily.    Dispense:  90 tablet    Refill:  0     Follow-up: Return in about 3 months (around 11/06/2022).  [name], MD

## 2022-08-13 LAB — ZINC: Zinc: 126 ug/dL (ref 60–130)

## 2022-08-13 LAB — RETICULOCYTES
ABS Retic: 277200 cells/uL — ABNORMAL HIGH (ref 20000–80000)
Retic Ct Pct: 8.8 %

## 2022-08-13 LAB — VITAMIN B1: Vitamin B1 (Thiamine): 46 nmol/L — ABNORMAL HIGH (ref 8–30)

## 2022-09-02 ENCOUNTER — Telehealth: Payer: Self-pay | Admitting: Internal Medicine

## 2022-09-02 NOTE — Telephone Encounter (Signed)
LVM for pt to rtn my call to schedule AWV after 09/06/22 with [facility] call [phone]

## 2022-09-04 ENCOUNTER — Other Ambulatory Visit: Payer: Self-pay | Admitting: Internal Medicine

## 2022-09-04 DIAGNOSIS — I10 Essential (primary) hypertension: Secondary | ICD-10-CM

## 2022-09-04 DIAGNOSIS — I5189 Other ill-defined heart diseases: Secondary | ICD-10-CM

## 2022-09-11 ENCOUNTER — Ambulatory Visit: Payer: Medicare Other

## 2022-09-17 ENCOUNTER — Ambulatory Visit: Payer: Medicare Other

## 2022-09-25 ENCOUNTER — Ambulatory Visit: Payer: Medicare Other

## 2022-09-25 ENCOUNTER — Other Ambulatory Visit: Payer: Self-pay | Admitting: Internal Medicine

## 2022-09-25 DIAGNOSIS — I10 Essential (primary) hypertension: Secondary | ICD-10-CM

## 2022-09-25 DIAGNOSIS — I5189 Other ill-defined heart diseases: Secondary | ICD-10-CM

## 2022-09-26 ENCOUNTER — Ambulatory Visit (INDEPENDENT_AMBULATORY_CARE_PROVIDER_SITE_OTHER): Payer: Medicare Other

## 2022-09-26 VITALS — BP 128/60 | HR 75 | Temp 98.3°F | Ht 64.0 in | Wt 167.4 lb

## 2022-09-26 DIAGNOSIS — Z0000 Encounter for general adult medical examination without abnormal findings: Secondary | ICD-10-CM

## 2022-09-26 NOTE — Progress Notes (Signed)
Subjective:   JAHNAVI MURATORE is a 88 y.o. female who presents for Medicare Annual (Subsequent) preventive examination.  Review of Systems     Cardiac Risk Factors include: advanced age (>32mn, >>30women);dyslipidemia;hypertension     Objective:    Today's Vitals   09/26/22 1117  BP: 128/60  Pulse: 75  Temp: 98.3 F (36.8 C)  SpO2: 94%  Weight: 167 lb 6.4 oz (75.9 kg)  Height: '5\' 4"'$  (1.626 m)  PainSc: 0-No pain   Body mass index is 28.73 kg/m.     09/26/2022   11:29 AM 09/06/2021   12:12 PM 05/31/2020   10:59 AM 08/31/2019    9:04 AM 08/30/2019    4:19 PM 08/04/2019    4:09 PM 08/04/2019   12:39 PM  Advanced Directives  Does Patient Have a Medical Advance Directive? Yes Yes Yes Yes Yes Yes Yes  Type of [place]   [place] Living will [place] will Living will  Does patient want to make changes to medical advance directive?  No - Patient declined No - Patient declined No - Patient declined  No - Patient declined   Copy of [place] Chart? No - copy requested   No - copy requested  No - copy requested     Current Medications (verified) Outpatient Encounter Medications as of 09/26/2022  Medication Sig   Calcium Carb-Cholecalciferol (CALCIUM CARBONATE-VITAMIN D3 PO) Take 1 tablet by mouth daily.    carvedilol (COREG) 12.5 MG tablet TAKE 1 TABLET BY MOUTH TWICE A DAY WITH FOOD   cholecalciferol (VITAMIN D) 1000 units tablet Take 1,000 Units by mouth daily.   methenamine (HIPREX) 1 g tablet Take 1 tablet (1 g total) by mouth 2 (two) times daily with a meal.   Multiple Vitamin (MULTIVITAMIN WITH MINERALS) TABS tablet Take 1 tablet by mouth daily.   Multiple Vitamins-Minerals (ZINC PO) Take by mouth.   torsemide (DEMADEX) 20 MG tablet TAKE 1 TABLET BY MOUTH EVERY DAY   vitamin C (ASCORBIC ACID) 500 MG tablet Take 500 mg by mouth daily.   traMADol  (ULTRAM) 50 MG tablet Take 50 mg by mouth as needed. (Patient not taking: Reported on 09/26/2022)   No facility-administered encounter medications on file as of 09/26/2022.    Allergies (verified) Sulfonamide derivatives   History: Past Medical History:  Diagnosis Date   ANEMIA 08/22/2009   Aortic stenosis    AR (aortic regurgitation) 01/2019   Mild to Moderate, Noted on ECHO   CAROTID ARTERY STENOSIS 24/00/8676  Complication of anesthesia    very hard to wake up from surgery   DYSPNEA ON EXERTION 11/16/2007   Gallstones    GEN OSTEOARTHROSIS INVOLVING MULTIPLE SITES 11/11/2008   GERD (gastroesophageal reflux disease)    Grade I diastolic dysfunction 019/5093  Noted on ECHO   Heart murmur    HEMORRHOIDS, INTERNAL    surgery was in the 90's (per patient)   History of blood transfusion    after hip and knee replacements   NECK PAIN, ACUTE 12/28/2009   PERIPHERAL EDEMA 10/06/2007   PONV (postoperative nausea and vomiting)    Pulmonary nodule 06/18/2019   noted on CT Chest    PVD 10/06/2007   Unspecified essential hypertension 10/19/2007   Past Surgical History:  Procedure Laterality Date   ABDOMINAL HYSTERECTOMY     BIOPSY  08/06/2019   Procedure: BIOPSY;  Surgeon: GSilvano Rusk  E, MD;  Location: [facility];  Service: Gastroenterology;;   BUNIONECTOMY     bilat   CARPAL TUNNEL RELEASE     CHOLECYSTECTOMY  01/29/2016   at [place] (ERCP) WITH PROPOFOL N/A 12/01/2015   Procedure: ENDOSCOPIC RETROGRADE CHOLANGIOPANCREATOGRAPHY (ERCP) WITH PROPOFOL;  Surgeon: [name], MD;  Location: [facility];  Service: Endoscopy;  Laterality: N/A;   ENDOSCOPIC RETROGRADE CHOLANGIOPANCREATOGRAPHY (ERCP) WITH PROPOFOL N/A 02/01/2016   Procedure: ENDOSCOPIC RETROGRADE CHOLANGIOPANCREATOGRAPHY (ERCP) WITH PROPOFOL;  Surgeon: [name], MD;  Location: [facility];  Service: Endoscopy;  Laterality:  N/A;   ENDOSCOPIC RETROGRADE CHOLANGIOPANCREATOGRAPHY (ERCP) WITH PROPOFOL N/A 04/07/2018   Procedure: ENDOSCOPIC RETROGRADE CHOLANGIOPANCREATOGRAPHY (ERCP) WITH PROPOFOL;  Surgeon: [name], MD;  [facility]: [facility];  Service: Endoscopy;  Laterality: N/A;   ESOPHAGOGASTRODUODENOSCOPY (EGD) WITH PROPOFOL N/A 08/06/2019   Procedure: ESOPHAGOGASTRODUODENOSCOPY (EGD) WITH PROPOFOL;  Surgeon: Gatha Mayer, MD;  Location: [facility];  Service: Gastroenterology;  Laterality: N/A;   HAMMER TOE SURGERY     HEMORRHOID SURGERY     Hyperplastic colon polyps, removed  2007   By Dr. Penelope Coop   JOINT REPLACEMENT  2011   right knee   OOPHORECTOMY     REMOVAL OF STONES  04/07/2018   Procedure: REMOVAL OF STONES;  Surgeon: [name], MD;  Location: [facility];  Service: Endoscopy;;   ROTATOR CUFF REPAIR  2004   left (Dr. Durward Fortes)   [place]  04/07/2018   Procedure: Joan Mayans;  Surgeon: [name], MD;  Location: [facility];  Service: Endoscopy;;   TOTAL HIP ARTHROPLASTY Right 11/26/2016   Procedure: RIGHT TOTAL HIP ARTHROPLASTY;  Surgeon: Garald Balding, MD;  Location: [facility];  Service: Orthopedics;  Laterality: Right;   TOTAL KNEE ARTHROPLASTY Right    TOTAL KNEE ARTHROPLASTY Left 07/26/2019   Procedure: TOTAL KNEE ARTHROPLASTY;  Surgeon: Gaynelle Arabian, MD;  Location: [facility];  Service: Orthopedics;  Laterality: Left;  39mn   Family History  Problem Relation Age of Onset   Ovarian cancer Mother    Cancer Mother    Leukemia Father    Lung cancer Father    Cancer Father    Cancer Brother    Diabetes Neg Hx    Heart disease Neg Hx    Hypertension Neg Hx    Social History   Socioeconomic History   Marital status: Divorced    Spouse name: Not on file   Number of children: 1   Years of education: 12   Highest education level: Not on file  Occupational History   Occupation: sProducer, television/film/video RETIRED    Comment: retired  Tobacco Use   Smoking  status: Never   Smokeless tobacco: Never  Vaping Use   Vaping Use: Never used  Substance and Sexual Activity   Alcohol use: No   Drug use: No   Sexual activity: Not Currently    Birth control/protection: Surgical  Other Topics Concern   Not on file  Social History Narrative   HSG. Married - divorced for many years.  Retired. Lives alone - very active and independent.   Social Determinants of Health   Financial Resource Strain: Low Risk  (09/26/2022)   Overall Financial Resource Strain (CARDIA)    Difficulty of Paying Living Expenses: Not hard at all  Food Insecurity: No Food Insecurity (09/26/2022)   Hunger Vital Sign    Worried About Running Out of Food  in the Last Year: Never true    [place] in the Last Year: Never true  Transportation Needs: No Transportation Needs (09/26/2022)   PRAPARE - Hydrologist (Medical): No    Lack of Transportation (Non-Medical): No  Physical Activity: Sufficiently Active (09/26/2022)   Exercise Vital Sign    Days of Exercise per Week: 5 days    Minutes of Exercise per Session: 30 min  Stress: No Stress Concern Present (09/26/2022)   [place]    Feeling of Stress : Not at all  Social Connections: Moderately Integrated (09/26/2022)   Social Connection and Isolation Panel [NHANES]    Frequency of Communication with Friends and Family: More than three times a week    Frequency of Social Gatherings with Friends and Family: More than three times a week    Attends Religious Services: More than 4 times per year    Active Member of [employer] or Organizations: Yes    Attends Music therapist: More than 4 times per year    Marital Status: Divorced    Tobacco Counseling Counseling given: Not Answered   Clinical Intake:  Pre-visit preparation completed: Yes  Pain : No/denies pain Pain Score: 0-No pain     BMI - recorded:  28.73 Nutritional Risks: None Diabetes: No  How often do you need to have someone help you when you read instructions, pamphlets, or other written materials from your doctor or pharmacy?: 1 - Never What is the last grade level you completed in school?: HSG  Diabetic? no  Interpreter Needed?: No  Information entered by :: Lisette Abu, LPN.   Activities of Daily Living    09/26/2022   11:52 AM  In your present state of health, do you have any difficulty performing the following activities:  Hearing? 0  Vision? 0  Difficulty concentrating or making decisions? 0  Walking or climbing stairs? 0  Dressing or bathing? 0  Doing errands, shopping? 0  Preparing Food and eating ? N  Using the Toilet? N  In the past six months, have you accidently leaked urine? N  Do you have problems with loss of bowel control? N  Managing your Medications? N  Managing your Finances? N  Housekeeping or managing your Housekeeping? N    Patient Care Team: Janith Lima, MD as PCP - General (Internal Medicine) Fay Records, MD as PCP - Cardiology (Cardiology) [name], MD (Gastroenterology) Darlin Coco, MD as Consulting Physician (Cardiology) Garald Balding, MD (Orthopedic Surgery) Charlton Haws, [facility] (Pharmacist) Allyn Kenner, MD as Consulting Physician (Dermatology)  Indicate any recent Medical Services you may have received from other than [facility] providers in the past year (date may be approximate).     Assessment:   This is a routine wellness examination for Ahley.  Hearing/Vision screen Hearing Screening - Comments:: Denies hearing difficulties   Vision Screening - Comments:: Wears rx glasses - up to date with routine eye exams with Rutherford Guys, MD.   Dietary issues and exercise activities discussed: Current Exercise Habits: Home exercise routine, Type of exercise: walking, Time (Minutes): 30, Frequency (Times/Week): 5, Weekly Exercise (Minutes/Week): 150,  Exercise limited by: orthopedic condition(s)   Goals Addressed             This Visit's Progress    Stay Healthy.        Depression Screen    09/26/2022   11:50 AM  09/06/2021   12:14 PM 07/19/2021    1:23 PM 05/31/2020   10:59 AM 06/08/2019    4:35 PM 07/09/2018    4:24 PM 07/08/2018    2:50 PM  PHQ 2/9 Scores  PHQ - 2 Score 0 0 0 0 0 0 0    Fall Risk    09/26/2022   11:52 AM 09/06/2021   12:14 PM 07/19/2021    1:22 PM 05/31/2020   10:59 AM 06/08/2019    4:35 PM  Fall Risk   Falls in the past year? 1 0 0 0 0  Number falls in past yr: 1 0  0 0  Injury with Fall? 0 0  0 0  Risk for fall due to :    Orthopedic patient Impaired mobility;Impaired balance/gait  Follow up Falls evaluation completed;Falls prevention discussed Falls evaluation completed  Falls evaluation completed Falls evaluation completed;Education provided    FALL RISK PREVENTION PERTAINING TO THE HOME:  Any stairs in or around the home? Yes  If so, are there any without handrails? No  Home free of loose throw rugs in walkways, pet beds, electrical cords, etc? Yes  Adequate lighting in your home to reduce risk of falls? Yes   ASSISTIVE DEVICES UTILIZED TO PREVENT FALLS:  Life alert? No  Use of a cane, walker or w/c? No  Grab bars in the bathroom? Yes  Shower chair or bench in shower? No  Elevated toilet seat or a handicapped toilet? No   TIMED UP AND GO:  Was the test performed? Yes .  Length of time to ambulate 10 feet: 6 sec.   Gait steady and fast without use of assistive device  Cognitive Function:        09/26/2022   11:52 AM  6CIT Screen  What Year? 0 points  What month? 0 points  What time? 0 points  Count back from 20 0 points  Months in reverse 0 points  Repeat phrase 0 points  Total Score 0 points    Immunizations Immunization History  Administered Date(s) Administered   Fluad Quad(high Dose 65+) 08/04/2019, 10/30/2020, 08/07/2022   Influenza Whole 08/26/2009, 08/25/2012    Influenza, High Dose Seasonal PF 08/03/2015, 08/01/2016, 07/31/2017, 09/01/2018   Influenza-Unspecified 07/26/2013, 07/28/2014, 08/12/2021   PFIZER(Purple Top)SARS-COV-2 Vaccination 02/26/2020, 03/22/2020, 11/15/2020   Pneumococcal Conjugate-13 02/01/2014   Pneumococcal Polysaccharide-23 05/15/2015, 07/19/2021   Td 07/16/2012   Zoster Recombinat (Shingrix) 04/19/2020, 10/21/2021   Zoster, Live 01/23/2013    TDAP status: Due, Education has been provided regarding the importance of this vaccine. Advised may receive this vaccine at local pharmacy or Health Dept. Aware to provide a copy of the vaccination record if obtained from local pharmacy or Health Dept. Verbalized acceptance and understanding.  Flu Vaccine status: Up to date  Pneumococcal vaccine status: Up to date  Covid-19 vaccine status: Completed vaccines  Qualifies for Shingles Vaccine? Yes   Zostavax completed Yes   Shingrix Completed?: Yes  Screening Tests Health Maintenance  Topic Date Due   TETANUS/TDAP  07/16/2022   Medicare Annual Wellness (AWV)  09/27/2023   Pneumonia Vaccine 90+ Years old  Completed   INFLUENZA VACCINE  Completed   DEXA SCAN  Completed   Zoster Vaccines- Shingrix  Completed   HPV VACCINES  Aged Out   COVID-19 Vaccine  Discontinued    Health Maintenance  Health Maintenance Due  Topic Date Due   TETANUS/TDAP  07/16/2022    Colorectal cancer screening: No longer required.   Mammogram status:  No longer required due to age.  Bone Density status: Completed 6//02/2014. Results reflect: Bone density results: OSTEOPENIA. Repeat every 0 years. (No longer recommended)  Lung Cancer Screening: (Low Dose CT Chest recommended if Age 2-80 years, 30 pack-year currently smoking OR have quit w/in 15years.) does not qualify.   Lung Cancer Screening Referral: no  Additional Screening:  Hepatitis C Screening: does not qualify; Completed no  Vision Screening: Recommended annual ophthalmology exams for  early detection of glaucoma and other disorders of the eye. Is the patient up to date with their annual eye exam?  Yes  Who is the provider or what is the name of the office in which the patient attends annual eye exams? Rutherford Guys, MD. If pt is not established with a provider, would they like to be referred to a provider to establish care? No .   Dental Screening: Recommended annual dental exams for proper oral hygiene  Community Resource Referral / Chronic Care Management: CRR required this visit?  No   CCM required this visit?  No      Plan:     I have personally reviewed and noted the following in the patient's chart:   Medical and social history Use of alcohol, tobacco or illicit drugs  Current medications and supplements including opioid prescriptions. Patient is not currently taking opioid prescriptions. Functional ability and status Nutritional status Physical activity Advanced directives List of other physicians Hospitalizations, surgeries, and ER visits in previous 12 months Vitals Screenings to include cognitive, depression, and falls Referrals and appointments  In addition, I have reviewed and discussed with patient certain preventive protocols, quality metrics, and best practice recommendations. A written personalized care plan for preventive services as well as general preventive health recommendations were provided to patient.     Sheral Flow, LPN   77/02/1286   Nurse Notes: N/A

## 2022-09-26 NOTE — Patient Instructions (Addendum)
Claudia Lara , Thank you for taking time to come for your Medicare Wellness Visit. I appreciate your ongoing commitment to your health goals. Please review the following plan we discussed and let me know if I can assist you in the future.   These are the goals we discussed:  Goals      Stay Healthy.        This is a list of the screening recommended for you and due dates:  Health Maintenance  Topic Date Due   Tetanus Vaccine  07/16/2022   Medicare Annual Wellness Visit  09/27/2023   Pneumonia Vaccine  Completed   Flu Shot  Completed   DEXA scan (bone density measurement)  Completed   Zoster (Shingles) Vaccine  Completed   HPV Vaccine  Aged Out   COVID-19 Vaccine  Discontinued    Advanced directives: Yes; documents on file.  Conditions/risks identified: Yes  Next appointment: Follow up in one year for your annual wellness visit.   Preventive Care 86 Years and Older, Female Preventive care refers to lifestyle choices and visits with your health care provider that can promote health and wellness. What does preventive care include? A yearly physical exam. This is also called an annual well check. Dental exams once or twice a year. Routine eye exams. Ask your health care provider how often you should have your eyes checked. Personal lifestyle choices, including: Daily care of your teeth and gums. Regular physical activity. Eating a healthy diet. Avoiding tobacco and drug use. Limiting alcohol use. Practicing safe sex. Taking low-dose aspirin every day. Taking vitamin and mineral supplements as recommended by your health care provider. What happens during an annual well check? The services and screenings done by your health care provider during your annual well check will depend on your age, overall health, lifestyle risk factors, and family history of disease. Counseling  Your health care provider may ask you questions about your: Alcohol use. Tobacco use. Drug  use. Emotional well-being. Home and relationship well-being. Sexual activity. Eating habits. History of falls. Memory and ability to understand (cognition). Work and work Statistician. Reproductive health. Screening  You may have the following tests or measurements: Height, weight, and BMI. Blood pressure. Lipid and cholesterol levels. These may be checked every 5 years, or more frequently if you are over 73 years old. Skin check. Lung cancer screening. You may have this screening every year starting at age 72 if you have a 30-pack-year history of smoking and currently smoke or have quit within the past 15 years. Fecal occult blood test (FOBT) of the stool. You may have this test every year starting at age 43. Flexible sigmoidoscopy or colonoscopy. You may have a sigmoidoscopy every 5 years or a colonoscopy every 10 years starting at age 32. Hepatitis C blood test. Hepatitis B blood test. Sexually transmitted disease (STD) testing. Diabetes screening. This is done by checking your blood sugar (glucose) after you have not eaten for a while (fasting). You may have this done every 1-3 years. Bone density scan. This is done to screen for osteoporosis. You may have this done starting at age 15. Mammogram. This may be done every 1-2 years. Talk to your health care provider about how often you should have regular mammograms. Talk with your health care provider about your test results, treatment options, and if necessary, the need for more tests. Vaccines  Your health care provider may recommend certain vaccines, such as: Influenza vaccine. This is recommended every year. Tetanus, diphtheria, and  acellular pertussis (Tdap, Td) vaccine. You may need a Td booster every 10 years. Zoster vaccine. You may need this after age 16. Pneumococcal 13-valent conjugate (PCV13) vaccine. One dose is recommended after age 53. Pneumococcal polysaccharide (PPSV23) vaccine. One dose is recommended after age  31. Talk to your health care provider about which screenings and vaccines you need and how often you need them. This information is not intended to replace advice given to you by your health care provider. Make sure you discuss any questions you have with your health care provider. Document Released: 12/08/2015 Document Revised: 07/31/2016 Document Reviewed: 09/12/2015 Elsevier Interactive Patient Education  2017 [place] Prevention in the Home Falls can cause injuries. They can happen to people of all ages. There are many things you can do to make your home safe and to help prevent falls. What can I do on the outside of my home? Regularly fix the edges of walkways and driveways and fix any cracks. Remove anything that might make you trip as you walk through a door, such as a raised step or threshold. Trim any bushes or trees on the path to your home. Use bright outdoor lighting. Clear any walking paths of anything that might make someone trip, such as rocks or tools. Regularly check to see if handrails are loose or broken. Make sure that both sides of any steps have handrails. Any raised decks and porches should have guardrails on the edges. Have any leaves, snow, or ice cleared regularly. Use sand or salt on walking paths during winter. Clean up any spills in your garage right away. This includes oil or grease spills. What can I do in the bathroom? Use night lights. Install grab bars by the toilet and in the tub and shower. Do not use towel bars as grab bars. Use non-skid mats or decals in the tub or shower. If you need to sit down in the shower, use a plastic, non-slip stool. Keep the floor dry. Clean up any water that spills on the floor as soon as it happens. Remove soap buildup in the tub or shower regularly. Attach bath mats securely with double-sided non-slip rug tape. Do not have throw rugs and other things on the floor that can make you trip. What can I do in the  bedroom? Use night lights. Make sure that you have a light by your bed that is easy to reach. Do not use any sheets or blankets that are too big for your bed. They should not hang down onto the floor. Have a firm chair that has side arms. You can use this for support while you get dressed. Do not have throw rugs and other things on the floor that can make you trip. What can I do in the kitchen? Clean up any spills right away. Avoid walking on wet floors. Keep items that you use a lot in easy-to-reach places. If you need to reach something above you, use a strong step stool that has a grab bar. Keep electrical cords out of the way. Do not use floor polish or wax that makes floors slippery. If you must use wax, use non-skid floor wax. Do not have throw rugs and other things on the floor that can make you trip. What can I do with my stairs? Do not leave any items on the stairs. Make sure that there are handrails on both sides of the stairs and use them. Fix handrails that are broken or loose. Make sure that  handrails are as long as the stairways. Check any carpeting to make sure that it is firmly attached to the stairs. Fix any carpet that is loose or worn. Avoid having throw rugs at the top or bottom of the stairs. If you do have throw rugs, attach them to the floor with carpet tape. Make sure that you have a light switch at the top of the stairs and the bottom of the stairs. If you do not have them, ask someone to add them for you. What else can I do to help prevent falls? Wear shoes that: Do not have high heels. Have rubber bottoms. Are comfortable and fit you well. Are closed at the toe. Do not wear sandals. If you use a stepladder: Make sure that it is fully opened. Do not climb a closed stepladder. Make sure that both sides of the stepladder are locked into place. Ask someone to hold it for you, if possible. Clearly mark and make sure that you can see: Any grab bars or  handrails. First and last steps. Where the edge of each step is. Use tools that help you move around (mobility aids) if they are needed. These include: Canes. Walkers. Scooters. Crutches. Turn on the lights when you go into a dark area. Replace any light bulbs as soon as they burn out. Set up your furniture so you have a clear path. Avoid moving your furniture around. If any of your floors are uneven, fix them. If there are any pets around you, be aware of where they are. Review your medicines with your doctor. Some medicines can make you feel dizzy. This can increase your chance of falling. Ask your doctor what other things that you can do to help prevent falls. This information is not intended to replace advice given to you by your health care provider. Make sure you discuss any questions you have with your health care provider. Document Released: 09/07/2009 Document Revised: 04/18/2016 Document Reviewed: 12/16/2014 Elsevier Interactive Patient Education  2017 [employer].

## 2022-10-09 ENCOUNTER — Other Ambulatory Visit: Payer: Self-pay | Admitting: Internal Medicine

## 2022-10-09 ENCOUNTER — Other Ambulatory Visit (INDEPENDENT_AMBULATORY_CARE_PROVIDER_SITE_OTHER): Payer: Medicare Other

## 2022-10-09 ENCOUNTER — Telehealth: Payer: Self-pay | Admitting: Internal Medicine

## 2022-10-09 DIAGNOSIS — R300 Dysuria: Secondary | ICD-10-CM | POA: Diagnosis not present

## 2022-10-09 NOTE — Telephone Encounter (Signed)
Patient thinks she has a uti and would like to go to lab to get her urine checked - please call patient and let her know

## 2022-10-09 NOTE — Telephone Encounter (Signed)
Pt went to lab and order was put in for it

## 2022-10-10 ENCOUNTER — Other Ambulatory Visit: Payer: Self-pay | Admitting: Internal Medicine

## 2022-10-10 DIAGNOSIS — N3000 Acute cystitis without hematuria: Secondary | ICD-10-CM | POA: Insufficient documentation

## 2022-10-10 LAB — URINALYSIS, ROUTINE W REFLEX MICROSCOPIC
Bilirubin Urine: NEGATIVE
Ketones, ur: NEGATIVE
Nitrite: NEGATIVE
Specific Gravity, Urine: 1.02 (ref 1.000–1.030)
Total Protein, Urine: 100 — AB
Urine Glucose: NEGATIVE
Urobilinogen, UA: 0.2 (ref 0.0–1.0)
pH: 6.5 (ref 5.0–8.0)

## 2022-10-10 MED ORDER — NITROFURANTOIN MONOHYD MACRO 100 MG PO CAPS: 100.0000 mg | ORAL_CAPSULE | Freq: Two times a day (BID) | ORAL | 0 refills | Status: AC

## 2022-10-12 LAB — CULTURE, URINE COMPREHENSIVE

## 2022-11-06 DIAGNOSIS — B078 Other viral warts: Secondary | ICD-10-CM | POA: Diagnosis not present

## 2022-11-06 DIAGNOSIS — L820 Inflamed seborrheic keratosis: Secondary | ICD-10-CM | POA: Diagnosis not present

## 2022-11-06 DIAGNOSIS — L821 Other seborrheic keratosis: Secondary | ICD-10-CM | POA: Diagnosis not present

## 2022-11-12 ENCOUNTER — Encounter: Payer: Self-pay | Admitting: Internal Medicine

## 2022-11-12 ENCOUNTER — Ambulatory Visit (INDEPENDENT_AMBULATORY_CARE_PROVIDER_SITE_OTHER): Payer: Medicare Other | Admitting: Internal Medicine

## 2022-11-12 VITALS — BP 150/60 | HR 79 | Temp 98.5°F | Ht 64.0 in | Wt 166.8 lb

## 2022-11-12 DIAGNOSIS — I5189 Other ill-defined heart diseases: Secondary | ICD-10-CM

## 2022-11-12 DIAGNOSIS — E785 Hyperlipidemia, unspecified: Secondary | ICD-10-CM | POA: Diagnosis not present

## 2022-11-12 DIAGNOSIS — I10 Essential (primary) hypertension: Secondary | ICD-10-CM | POA: Diagnosis not present

## 2022-11-12 DIAGNOSIS — R0602 Shortness of breath: Secondary | ICD-10-CM | POA: Diagnosis not present

## 2022-11-12 DIAGNOSIS — I350 Nonrheumatic aortic (valve) stenosis: Secondary | ICD-10-CM | POA: Diagnosis not present

## 2022-11-12 DIAGNOSIS — D599 Acquired hemolytic anemia, unspecified: Secondary | ICD-10-CM

## 2022-11-12 LAB — CBC WITH DIFFERENTIAL/PLATELET
Basophils Absolute: 0.1 10*3/uL (ref 0.0–0.1)
Basophils Relative: 0.7 % (ref 0.0–3.0)
Eosinophils Absolute: 0.1 10*3/uL (ref 0.0–0.7)
Eosinophils Relative: 1.6 % (ref 0.0–5.0)
HCT: 29.7 % — ABNORMAL LOW (ref 36.0–46.0)
Hemoglobin: 10.6 g/dL — ABNORMAL LOW (ref 12.0–15.0)
Lymphocytes Relative: 16.9 % (ref 12.0–46.0)
Lymphs Abs: 1.6 10*3/uL (ref 0.7–4.0)
MCHC: 35.6 g/dL (ref 30.0–36.0)
MCV: 86.3 fl (ref 78.0–100.0)
Monocytes Absolute: 0.8 10*3/uL (ref 0.1–1.0)
Monocytes Relative: 9.1 % (ref 3.0–12.0)
Neutro Abs: 6.7 10*3/uL (ref 1.4–7.7)
Neutrophils Relative %: 71.7 % (ref 43.0–77.0)
Platelets: 239 10*3/uL (ref 150.0–400.0)
RBC: 3.44 Mil/uL — ABNORMAL LOW (ref 3.87–5.11)
RDW: 21 % — ABNORMAL HIGH (ref 11.5–15.5)
WBC: 9.3 10*3/uL (ref 4.0–10.5)

## 2022-11-12 LAB — LIPID PANEL
Cholesterol: 132 mg/dL (ref 0–200)
HDL: 53.9 mg/dL (ref >39.00)
LDL Cholesterol: 54 mg/dL (ref 0–99)
NonHDL: 78.12
Total CHOL/HDL Ratio: 2
Triglycerides: 120 mg/dL (ref 0.0–149.0)
VLDL: 24 mg/dL (ref 0.0–40.0)

## 2022-11-12 LAB — BASIC METABOLIC PANEL
BUN: 31 mg/dL — ABNORMAL HIGH (ref 6–23)
CO2: 27 mEq/L (ref 19–32)
Calcium: 9.6 mg/dL (ref 8.4–10.5)
Chloride: 103 mEq/L (ref 96–112)
Creatinine, Ser: 1.35 mg/dL — ABNORMAL HIGH (ref 0.40–1.20)
GFR: 35.06 mL/min — ABNORMAL LOW (ref >60.00)
Glucose, Bld: 96 mg/dL (ref 70–99)
Potassium: 4.6 mEq/L (ref 3.5–5.1)
Sodium: 139 mEq/L (ref 135–145)

## 2022-11-12 LAB — BRAIN NATRIURETIC PEPTIDE: Pro B Natriuretic peptide (BNP): 203 pg/mL — ABNORMAL HIGH (ref 0.0–100.0)

## 2022-11-12 LAB — TROPONIN I (HIGH SENSITIVITY): High Sens Troponin I: 7 ng/L (ref 2–17)

## 2022-11-12 NOTE — Progress Notes (Signed)
Subjective:  Patient ID: Claudia Lara, female    DOB: Sep 18, 1932  Age: 88 y.o. MRN: [id number]  CC: Anemia and Hypertension   HPI Claudia Lara presents for f/up -     She complains of shortness of breath/dyspnea on exertion.  She denies chest pain, diaphoresis, edema, or palpitations.  Outpatient Medications Prior to Visit  Medication Sig Dispense Refill   Calcium Carb-Cholecalciferol (CALCIUM CARBONATE-VITAMIN D3 PO) Take 1 tablet by mouth daily.      carvedilol (COREG) 12.5 MG tablet TAKE 1 TABLET BY MOUTH TWICE A DAY WITH FOOD 180 tablet 0   cholecalciferol (VITAMIN D) 1000 units tablet Take 1,000 Units by mouth daily.     methenamine (HIPREX) 1 g tablet Take 1 tablet (1 g total) by mouth 2 (two) times daily with a meal. 180 tablet 1   Multiple Vitamin (MULTIVITAMIN WITH MINERALS) TABS tablet Take 1 tablet by mouth daily.     Multiple Vitamins-Minerals (ZINC PO) Take by mouth.     vitamin C (ASCORBIC ACID) 500 MG tablet Take 500 mg by mouth daily.     torsemide (DEMADEX) 20 MG tablet TAKE 1 TABLET BY MOUTH EVERY DAY (Patient not taking: Reported on 11/12/2022) 90 tablet 0   traMADol (ULTRAM) 50 MG tablet Take 50 mg by mouth as needed. (Patient not taking: Reported on 09/26/2022)     No facility-administered medications prior to visit.    ROS Review of Systems  Constitutional:  Negative for diaphoresis and fatigue.  HENT: Negative.    Eyes: Negative.   Respiratory:  Positive for shortness of breath. Negative for cough, chest tightness and wheezing.   Cardiovascular:  Negative for chest pain, palpitations and leg swelling.  Gastrointestinal:  Negative for abdominal pain, constipation, diarrhea, nausea and vomiting.  Endocrine: Negative.   Genitourinary: Negative.  Negative for difficulty urinating, dysuria and hematuria.  Musculoskeletal: Negative.  Negative for arthralgias and myalgias.  Skin: Negative.   Neurological:  Negative for dizziness, weakness, light-headedness  and headaches.  Hematological:  Negative for adenopathy. Does not bruise/bleed easily.  Psychiatric/Behavioral: Negative.      Objective:  BP (!) 150/60   Pulse 79   Temp 98.5 F (36.9 C) (Oral)   Ht '5\' 4"'$  (1.626 m)   Wt 166 lb 12.8 oz (75.7 kg)   SpO2 97%   BMI 28.63 kg/m   BP Readings from Last 3 Encounters:  11/12/22 (!) 150/60  09/26/22 128/60  08/07/22 138/60    Wt Readings from Last 3 Encounters:  11/12/22 166 lb 12.8 oz (75.7 kg)  09/26/22 167 lb 6.4 oz (75.9 kg)  08/07/22 169 lb (76.7 kg)    Physical Exam Vitals reviewed.  Constitutional:      Appearance: She is not ill-appearing.  HENT:     Nose: Nose normal.     Mouth/Throat:     Mouth: Mucous membranes are moist. Mucous membranes are pale.  Eyes:     General: No scleral icterus.    Pupils: Pupils are equal, round, and reactive to light.  Cardiovascular:     Rate and Rhythm: Normal rate and regular rhythm.     Heart sounds: Murmur heard.     Systolic murmur is present with a grade of 1/6.     No diastolic murmur is present.     No friction rub. No gallop.     Comments: EKG- NSR, 81 bpm NS ST/T wave changes No LVH or Q waves Pulmonary:     Effort: Pulmonary  effort is normal.     Breath sounds: No stridor. No wheezing, rhonchi or rales.  Abdominal:     General: Abdomen is flat.     Palpations: There is no mass.     Tenderness: There is no abdominal tenderness. There is no guarding.     Hernia: No hernia is present.  Musculoskeletal:     Right lower leg: No edema.     Left lower leg: No edema.  Skin:    General: Skin is warm and dry.  Neurological:     General: No focal deficit present.     Mental Status: She is alert. Mental status is at baseline.  Psychiatric:        Mood and Affect: Mood normal.        Behavior: Behavior normal.     Lab Results  Component Value Date   WBC 9.3 11/12/2022   HGB 10.6 (L) 11/12/2022   HCT 29.7 (L) 11/12/2022   PLT 239.0 11/12/2022   GLUCOSE 96  11/12/2022   CHOL 132 11/12/2022   TRIG 120.0 11/12/2022   HDL 53.90 11/12/2022   LDLDIRECT 56.0 07/08/2018   LDLCALC 54 11/12/2022   ALT 12 02/14/2022   AST 19 02/14/2022   NA 139 11/12/2022   K 4.6 11/12/2022   CL 103 11/12/2022   CREATININE 1.35 (H) 11/12/2022   BUN 31 (H) 11/12/2022   CO2 27 11/12/2022   TSH 1.16 05/17/2020   INR 1.1 (H) 05/17/2020   HGBA1C 4.8 06/08/2019    MYOCARDIAL PERFUSION IMAGING  Result Date: 03/07/2021  The left ventricular ejection fraction is normal (55-65%).  Nuclear stress EF: 65%.  There was no ST segment deviation noted during stress.  The study is normal.  This is a low risk study.  No ischemia or infarction on perfusion images. Normal wall motion.    Assessment & Plan:   Claudia Lara was seen today for anemia and hypertension.  Diagnoses and all orders for this visit:  SOB (shortness of breath)- Her EKG and labs are reassuring.  She has a history of HFpEF so I recommended that she start taking an SGLT2 inhibitor. -     EKG 12-Lead -     CBC with Differential/Platelet; Future -     Troponin I (High Sensitivity); Future -     Brain natriuretic peptide; Future -     Brain natriuretic peptide -     Troponin I (High Sensitivity) -     CBC with Differential/Platelet  Essential hypertension- Her blood pressure is adequately well-controlled. -     CBC with Differential/Platelet; Future -     Basic metabolic panel; Future -     Basic metabolic panel -     CBC with Differential/Platelet  Hyperlipidemia with target LDL less than 130 -     Lipid panel; Future -     Lipid panel  Acquired hemolytic anemia (HCC)- Haptoglobin is slightly low again, H&H are stable, LDH is normal.  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 -     CBC with Differential/Platelet; Future -     Lactate dehydrogenase; Future -     Haptoglobin; Future -     Haptoglobin -     Lactate dehydrogenase -     CBC with Differential/Platelet  Aortic stenosis, mild -      Ambulatory referral to Cardiology  Left ventricular diastolic dysfunction, NYHA class 1 -     Ambulatory referral to Cardiology -     empagliflozin (  JARDIANCE) 10 MG TABS tablet; Take 1 tablet (10 mg total) by mouth daily before breakfast.  Other orders -     Extra Specimen   I have discontinued [name]. Lacaze's traMADol. I am also having her start on empagliflozin. Additionally, I am having her maintain her ascorbic acid, cholecalciferol, Calcium Carb-Cholecalciferol (CALCIUM CARBONATE-VITAMIN D3 PO), multivitamin with minerals, Multiple Vitamins-Minerals (ZINC PO), methenamine, carvedilol, and torsemide.  Meds ordered this encounter  Medications   empagliflozin (JARDIANCE) 10 MG TABS tablet    Sig: Take 1 tablet (10 mg total) by mouth daily before breakfast.    Dispense:  90 tablet    Refill:  1     Follow-up: Return in about 3 months (around 02/11/2023).  [name], MD

## 2022-11-12 NOTE — Patient Instructions (Signed)

## 2022-11-13 ENCOUNTER — Encounter: Payer: Self-pay | Admitting: Internal Medicine

## 2022-11-13 LAB — EXTRA SPECIMEN

## 2022-11-13 LAB — HAPTOGLOBIN: Haptoglobin: 24 mg/dL — ABNORMAL LOW (ref 43–212)

## 2022-11-13 LAB — LACTATE DEHYDROGENASE: LDH: 202 U/L (ref 120–250)

## 2022-11-16 MED ORDER — EMPAGLIFLOZIN 10 MG PO TABS: 10.0000 mg | ORAL_TABLET | Freq: Every day | ORAL | 1 refills | Status: DC

## 2022-11-21 ENCOUNTER — Telehealth: Payer: Self-pay | Admitting: Internal Medicine

## 2022-11-21 NOTE — Telephone Encounter (Signed)
Per verbal conversation with PCP Claudia Lara was prescribed to help the SOB she is experiencing due to the type of heart failure that she has.   Pt has been informed and expressed understanding.

## 2022-11-21 NOTE — Telephone Encounter (Signed)
Patient called and is very upset.  She is upset that the pharmacy told her she is diabetic and that you have never discussed this with her.  She is upset about the cost of the jardiance.  Please call patient at [phone]

## 2022-11-21 NOTE — Progress Notes (Signed)
Office Visit    Patient Name: Claudia Lara Date of Encounter: 11/22/2022  Primary Care Provider:  Janith Lima, MD Primary Cardiologist:  Dorris Carnes, MD Primary Electrophysiologist: None  Chief Complaint    Claudia Lara is a 88 y.o. female with PMH of HFpEF, interstitial lung disease, GERD, aortic sclerosis, hypertension, carotid artery stenosis who presents today for complaint of shortness of breath.  Past Medical History    Past Medical History:  Diagnosis Date   ANEMIA 08/22/2009   Aortic stenosis    AR (aortic regurgitation) 01/2019   Mild to Moderate, Noted on ECHO   CAROTID ARTERY STENOSIS 03/27/5464   Complication of anesthesia    very hard to wake up from surgery   DYSPNEA ON EXERTION 11/16/2007   Gallstones    GEN OSTEOARTHROSIS INVOLVING MULTIPLE SITES 11/11/2008   GERD (gastroesophageal reflux disease)    Grade I diastolic dysfunction 68/1275   Noted on ECHO   Heart murmur    HEMORRHOIDS, INTERNAL    surgery was in the 90's (per patient)   History of blood transfusion    after hip and knee replacements   NECK PAIN, ACUTE 12/28/2009   PERIPHERAL EDEMA 10/06/2007   PONV (postoperative nausea and vomiting)    Pulmonary nodule 06/18/2019   noted on CT Chest    PVD 10/06/2007   Unspecified essential hypertension 10/19/2007   Past Surgical History:  Procedure Laterality Date   ABDOMINAL HYSTERECTOMY     BIOPSY  08/06/2019   Procedure: BIOPSY;  Surgeon: Gatha Mayer, MD;  Location: [facility];  Service: Gastroenterology;;   [name]     bilat   CARPAL [place]  01/29/2016   at [place] (ERCP) WITH PROPOFOL N/A 12/01/2015   Procedure: ENDOSCOPIC RETROGRADE CHOLANGIOPANCREATOGRAPHY (ERCP) WITH PROPOFOL;  Surgeon: [name], MD;  Location: [facility];  Service: Endoscopy;  Laterality: N/A;   ENDOSCOPIC RETROGRADE  CHOLANGIOPANCREATOGRAPHY (ERCP) WITH PROPOFOL N/A 02/01/2016   Procedure: ENDOSCOPIC RETROGRADE CHOLANGIOPANCREATOGRAPHY (ERCP) WITH PROPOFOL;  Surgeon: [name], MD;  Location: [facility];  Service: Endoscopy;  Laterality: N/A;   ENDOSCOPIC RETROGRADE CHOLANGIOPANCREATOGRAPHY (ERCP) WITH PROPOFOL N/A 04/07/2018   Procedure: ENDOSCOPIC RETROGRADE CHOLANGIOPANCREATOGRAPHY (ERCP) WITH PROPOFOL;  Surgeon: [name], MD;  Location: [facility];  Service: Endoscopy;  Laterality: N/A;   ESOPHAGOGASTRODUODENOSCOPY (EGD) WITH PROPOFOL N/A 08/06/2019   Procedure: ESOPHAGOGASTRODUODENOSCOPY (EGD) WITH PROPOFOL;  Surgeon: Gatha Mayer, MD;  Location: [facility];  Service: Gastroenterology;  Laterality: N/A;   HAMMER TOE SURGERY     HEMORRHOID SURGERY     Hyperplastic colon polyps, removed  2007   By Dr. Penelope Coop   JOINT REPLACEMENT  2011   right knee   OOPHORECTOMY     REMOVAL OF STONES  04/07/2018   Procedure: REMOVAL OF STONES;  Surgeon: [name], MD;  Location: [facility];  Service: Endoscopy;;   ROTATOR CUFF REPAIR  2004   left (Dr. Durward Fortes)   [place]  04/07/2018   Procedure: Joan Mayans;  Surgeon: [name], MD;  Location: [facility];  Service: Endoscopy;;   TOTAL HIP ARTHROPLASTY Right 11/26/2016   Procedure: RIGHT TOTAL HIP ARTHROPLASTY;  Surgeon: Garald Balding, MD;  Location: [facility];  Service: Orthopedics;  Laterality: Right;   TOTAL KNEE ARTHROPLASTY Right    TOTAL KNEE ARTHROPLASTY Left 07/26/2019   Procedure: TOTAL KNEE ARTHROPLASTY;  Surgeon: Gaynelle Arabian, MD;  Location: [facility];  Service: Orthopedics;  Laterality: Left;  31mn    Allergies  Allergies  Allergen Reactions   Sulfonamide Derivatives Rash    Childhood reaction    History of Present Illness    Claudia Lara is a 88year old female with the above mention past medical history who presents today for follow-up of shortness of breath.  [name] initially followed by Dr.  BMare Ferrariand has been followed by Claudia Lara 2017.  She has a history of mild aortic sclerosis and 2D echo was completed in 2019 showing EF of 65-70%, grade 1 DD with no RWMA and mild MR with aortic valve mean gradient 12 mmHg.  She had a CT of the chest completed 05/2019 showing pulmonary nodules with groundglass opacities. She was last seen by Dr. RHarrington Challengerin 2021 for follow-up of dyspnea.  During her visit patient reported breathing was stable.  She underwent Lexiscan Myoview for complaint of substernal chest discomfort and fatigue in 02/2021 that was negative for ischemia.  She has been followed by hypertension clinic for management of blood pressure.  She was seen by her PCP on 11/12/2022 for complaint of shortness of breath.  She was started on Jardiance 10 mg for management of LV dysfunction.   Claudia Lara today for complaint of shortness of breath with exertion.  Since last being seen in the office patient reports that she is noticed more exertional shortness of breath that resolves with rest.  She reports that her heart rate is not elevated and blood pressure today is 142/60 and was 136/64 on recheck..  She is otherwise euvolemic on exam today and denies any indiscretions with sodium.  She is compliant with her medications and denies any adverse reactions.  She does have occasional swelling in her lower extremities that is managed well with torsemide.  During our visit we discussed the pathophysiology of heart failure and reviewed the medications use for optimize guideline directed therapy.  Patient denies chest pain, palpitations, dyspnea, PND, orthopnea, nausea, vomiting, dizziness, syncope, edema, weight gain, or early satiety.  Home Medications    Current Outpatient Medications  Medication Sig Dispense Refill   Calcium Carb-Cholecalciferol (CALCIUM CARBONATE-VITAMIN D3 PO) Take 1 tablet by mouth daily.      carvedilol (COREG) 12.5 MG tablet TAKE 1 TABLET BY MOUTH TWICE A DAY WITH FOOD  180 tablet 0   cholecalciferol (VITAMIN D) 1000 units tablet Take 1,000 Units by mouth daily.     empagliflozin (JARDIANCE) 10 MG TABS tablet Take 1 tablet (10 mg total) by mouth daily before breakfast. 90 tablet 1   methenamine (HIPREX) 1 g tablet Take 1 tablet (1 g total) by mouth 2 (two) times daily with a meal. 180 tablet 1   Multiple Vitamin (MULTIVITAMIN WITH MINERALS) TABS tablet Take 1 tablet by mouth daily.     Multiple Vitamins-Minerals (ZINC PO) Take by mouth.     torsemide (DEMADEX) 20 MG tablet Take 1 tablet by mouth daily, may take 1 extra tablet by mouth daily only as needed for increased edema or worsening shortness of breath 120 tablet 3   vitamin C (ASCORBIC ACID) 500 MG tablet Take 500 mg by mouth daily.     No current facility-administered medications for this visit.     Review of Systems  Please see the history of present illness.    (+) Shortness of breath with exertion (+) Fatigue  All other systems reviewed and are otherwise negative except  as noted above.  Physical Exam    Wt Readings from Last 3 Encounters:  11/22/22 165 lb 12.8 oz (75.2 kg)  11/12/22 166 lb 12.8 oz (75.7 kg)  09/26/22 167 lb 6.4 oz (75.9 kg)   VS: Vitals:   11/22/22 1356  BP: (!) 142/60  Pulse: 76  SpO2: 95%  ,Body mass index is 28.46 kg/m.  Constitutional:      Appearance: Healthy appearance. Not in distress.  Neck:     Vascular: JVD normal.  Pulmonary:     Effort: Pulmonary effort is normal.     Breath sounds: No wheezing. No rales. Diminished in the bases Cardiovascular:     Normal rate. Regular rhythm. Normal S1. Normal S2.      Murmurs: There is no murmur.  Edema:    Peripheral edema +1 lower extremities Abdominal:     Palpations: Abdomen is soft non tender. There is no hepatomegaly.  Skin:    General: Skin is warm and dry.  Neurological:     General: No focal deficit present.     Mental Status: Alert and oriented to person, place and time.     Cranial Nerves:  Cranial nerves are intact.  EKG/LABS/Other Studies Reviewed    ECG personally reviewed by me today -none completed today   Lab Results  Component Value Date   WBC 9.3 11/12/2022   HGB 10.6 (L) 11/12/2022   HCT 29.7 (L) 11/12/2022   MCV 86.3 11/12/2022   PLT 239.0 11/12/2022   Lab Results  Component Value Date   CREATININE 1.35 (H) 11/12/2022   BUN 31 (H) 11/12/2022   NA 139 11/12/2022   K 4.6 11/12/2022   CL 103 11/12/2022   CO2 27 11/12/2022   Lab Results  Component Value Date   ALT 12 02/14/2022   AST 19 02/14/2022   ALKPHOS 68 02/14/2022   BILITOT 1.7 (H) 02/14/2022   Lab Results  Component Value Date   CHOL 132 11/12/2022   HDL 53.90 11/12/2022   LDLCALC 54 11/12/2022   LDLDIRECT 56.0 07/08/2018   TRIG 120.0 11/12/2022   CHOLHDL 2 11/12/2022    Lab Results  Component Value Date   HGBA1C 4.8 06/08/2019    Assessment & Plan    1.  Dyspnea on exertion: -Patient had myocardial perfusion completed 02/2021 that showed low risk study.  She reports that her shortness of breath occurs with activity and is relieved with rest. -She is currently on torsemide 20 mg daily for increased swelling. -She was advised to take extra 20 mg of torsemide if shortness of breath  -We will refer to pulmonology   2.  Essential hypertension: -Patient's blood pressure today was initially elevated at 142/60 and was 136/64 on recheck -Continue carvedilol 12.5 mg twice daily  3.  Mild aortic stenosis: -2D echo will be updated to evaluate status of aortic stenosis -Continue good blood pressure control with carvedilol 12.5 mg twice daily  4.  HFpEF: -Patient's last 2D echo was completed in 2019 with EF of 65-70% and grade 1 DD.  2D echo to be repeated as noted above -NYHA II-III -Today patient is euvolemic on exam with +1 lower extremity edema. -Continue GDMT with Jardiance 10 mg, carvedilol 12.5 mg twice daily -Continue torsemide 20 mg daily with 20 mg as needed for shortness of  breath and excess swelling  -Low sodium diet, fluid restriction <2L, and daily weights encouraged. Educated to contact our office for weight gain of 2 lbs overnight or 5  lbs in one week.   Medication Adjustments/Labs and Tests Ordered: Current medicines are reviewed at length with the patient today.  Concerns regarding medicines are outlined above.   Signed, Mable Fill, Marissa Nestle, NP 11/22/2022, 3:17 PM [facility]  Note:  This document was prepared using Dragon voice recognition software and may include unintentional dictation errors.

## 2022-11-22 ENCOUNTER — Ambulatory Visit: Payer: Medicare Other | Attending: Nurse Practitioner | Admitting: Nurse Practitioner

## 2022-11-22 ENCOUNTER — Encounter: Payer: Self-pay | Admitting: Nurse Practitioner

## 2022-11-22 VITALS — BP 142/60 | HR 76 | Ht 64.0 in | Wt 165.8 lb

## 2022-11-22 DIAGNOSIS — R0602 Shortness of breath: Secondary | ICD-10-CM

## 2022-11-22 DIAGNOSIS — I10 Essential (primary) hypertension: Secondary | ICD-10-CM

## 2022-11-22 DIAGNOSIS — I5033 Acute on chronic diastolic (congestive) heart failure: Secondary | ICD-10-CM

## 2022-11-22 DIAGNOSIS — I350 Nonrheumatic aortic (valve) stenosis: Secondary | ICD-10-CM | POA: Diagnosis not present

## 2022-11-22 MED ORDER — TORSEMIDE 20 MG PO TABS: ORAL_TABLET | ORAL | 3 refills | Status: DC

## 2022-11-22 NOTE — Patient Instructions (Signed)
Medication Instructions:  Your physician has recommended you make the following change in your medication:   CONTINUE the Torsemide 20 mg daily, you may take 1 extra tablet daily ONLY AS NEEDED FOR INCREASED EDEMA / SWELLING, OR WORSENING SHORTNESS OF BREATH  *If you need a refill on your cardiac medications before your next appointment, please call your pharmacy*   Lab Work: None ordered  If you have labs (blood work) drawn today and your tests are completely normal, you will receive your results only by: [facility] (if you have MyChart) OR A paper copy in the mail If you have any lab test that is abnormal or we need to change your treatment, we will call you to review the results.   Testing/Procedures: Your physician has requested that you have an echocardiogram. Echocardiography is a painless test that uses sound waves to create images of your heart. It provides your doctor with information about the size and shape of your heart and how well your heart's chambers and valves are working. This procedure takes approximately one hour. There are no restrictions for this procedure. Please do NOT wear cologne, perfume, aftershave, or lotions (deodorant is allowed). Please arrive 15 minutes prior to your appointment time.   You have been referred to [facility], they will call you with an appointment.    Follow-Up: At [facility], you and your health needs are our priority.  As part of our continuing mission to provide you with exceptional heart care, we have created designated Provider Care Teams.  These Care Teams include your primary Cardiologist (physician) and Advanced Practice Providers (APPs -  Physician Assistants and Nurse Practitioners) who all work together to provide you with the care you need, when you need it.  We recommend signing up for the patient portal called "MyChart".  Sign up information is provided on this After Visit Summary.  MyChart is used to  connect with patients for Virtual Visits (Telemedicine).  Patients are able to view lab/test results, encounter notes, upcoming appointments, etc.  Non-urgent messages can be sent to your provider as well.   To learn more about what you can do with MyChart, go to [email or web address].    Your next appointment:   2 month(s)  The format for your next appointment:   In Person  Provider:   Ambrose Pancoast, NP         Other Instructions Your physician has requested that you regularly monitor and record your blood pressure readings at home. Please use the same machine at the same time of day to check your readings and record them to bring to your follow-up visit.   Please monitor blood pressures and keep a log of your readings.    Make sure to check 2 hours after your medications.    AVOID these things for 30 minutes before checking your blood pressure: No Drinking caffeine. No Drinking alcohol. No Eating. No Smoking. No Exercising.   Five minutes before checking your blood pressure: Pee. Sit in a dining chair. Avoid sitting in a soft couch or armchair. Be quiet. Do not talk  Blood Pressure Record Sheet To take your blood pressure, you will need a blood pressure machine. You may be prescribed one, or you can buy a blood pressure machine (blood pressure monitor) at your clinic, drug store, or online. When choosing one, look for these features: An automatic monitor that has an arm cuff. A cuff that wraps snugly, but not too tightly, around your upper arm.  You should be able to fit only one finger between your arm and the cuff. A device that stores blood pressure reading results. Do not choose a monitor that measures your blood pressure from your wrist or finger. Follow your health care provider's instructions for how to take your blood pressure. To use this form: Get one reading in the morning (a.m.) before you take any medicines. Get one reading in the evening (p.m.) before  supper. Take at least two readings with each blood pressure check. This makes sure the results are correct. Wait 1-2 minutes between measurements. Write down the results in the spaces on this form. Repeat this once a week, or as told by your health care provider. Make a follow-up appointment with your health care provider to discuss the results. Blood pressure log Date: _______________________ a.m. _____________________(1st reading) _____________________(2nd reading) p.m. _____________________(1st reading) _____________________(2nd reading) Date: _______________________ a.m. _____________________(1st reading) _____________________(2nd reading) p.m. _____________________(1st reading) _____________________(2nd reading) Date: _______________________ a.m. _____________________(1st reading) _____________________(2nd reading) p.m. _____________________(1st reading) _____________________(2nd reading) Date: _______________________ a.m. _____________________(1st reading) _____________________(2nd reading) p.m. _____________________(1st reading) _____________________(2nd reading) Date: _______________________ a.m. _____________________(1st reading) _____________________(2nd reading) p.m. _____________________(1st reading) _____________________(2nd reading) This information is not intended to replace advice given to you by your health care provider. Make sure you discuss any questions you have with your health care provider. Document Revised: 07/26/2021 Document Reviewed: 07/26/2021 Elsevier Patient Education  [place] Eating Plan DASH stands for Dietary Approaches to Stop Hypertension. The DASH eating plan is a healthy eating plan that has been shown to: Reduce high blood pressure (hypertension). Reduce your risk for type 2 diabetes, heart disease, and stroke. Help with weight loss. What are tips for following this plan? Reading food labels Check food labels for the amount of  salt (sodium) per serving. Choose foods with less than 5 percent of the Daily Value of sodium. Generally, foods with less than 300 milligrams (mg) of sodium per serving fit into this eating plan. To find whole grains, look for the word "whole" as the first word in the ingredient list. Shopping Buy products labeled as "low-sodium" or "no salt added." Buy fresh foods. Avoid canned foods and pre-made or frozen meals. Cooking Avoid adding salt when cooking. Use salt-free seasonings or herbs instead of table salt or sea salt. Check with your health care provider or pharmacist before using salt substitutes. Do not fry foods. Cook foods using healthy methods such as baking, boiling, grilling, roasting, and broiling instead. Cook with heart-healthy oils, such as olive, canola, avocado, soybean, or sunflower oil. Meal planning  Eat a balanced diet that includes: 4 or more servings of fruits and 4 or more servings of vegetables each day. Try to fill one-half of your plate with fruits and vegetables. 6-8 servings of whole grains each day. Less than 6 oz (170 g) of lean meat, poultry, or fish each day. A 3-oz (85-g) serving of meat is about the same size as a deck of cards. One egg equals 1 oz (28 g). 2-3 servings of low-fat dairy each day. One serving is 1 cup (237 mL). 1 serving of nuts, seeds, or beans 5 times each week. 2-3 servings of heart-healthy fats. Healthy fats called omega-3 fatty acids are found in foods such as walnuts, flaxseeds, fortified milks, and eggs. These fats are also found in cold-water fish, such as sardines, salmon, and mackerel. Limit how much you eat of: Canned or prepackaged foods. Food that is high in trans fat, such as some fried foods.  Food that is high in saturated fat, such as fatty meat. Desserts and other sweets, sugary drinks, and other foods with added sugar. Full-fat dairy products. Do not salt foods before eating. Do not eat more than 4 egg yolks a week. Try to  eat at least 2 vegetarian meals a week. Eat more home-cooked food and less restaurant, buffet, and fast food. Lifestyle When eating at a restaurant, ask that your food be prepared with less salt or no salt, if possible. If you drink alcohol: Limit how much you use to: 0-1 drink a day for women who are not pregnant. 0-2 drinks a day for men. Be aware of how much alcohol is in your drink. In the [place] one drink equals one 12 oz bottle of beer (355 mL), one 5 oz glass of wine (148 mL), or one 1 oz glass of hard liquor (44 mL). General information Avoid eating more than 2,300 mg of salt a day. If you have hypertension, you may need to reduce your sodium intake to 1,500 mg a day. Work with your health care provider to maintain a healthy body weight or to lose weight. Ask what an ideal weight is for you. Get at least 30 minutes of exercise that causes your heart to beat faster (aerobic exercise) most days of the week. Activities may include walking, swimming, or biking. Work with your health care provider or dietitian to adjust your eating plan to your individual calorie needs. What foods should I eat? Fruits All fresh, dried, or frozen fruit. Canned fruit in natural juice (without added sugar). Vegetables Fresh or frozen vegetables (raw, steamed, roasted, or grilled). Low-sodium or reduced-sodium tomato and vegetable juice. Low-sodium or reduced-sodium tomato sauce and tomato paste. Low-sodium or reduced-sodium canned vegetables. Grains Whole-grain or whole-wheat bread. Whole-grain or whole-wheat pasta. Brown rice. [place]. Bulgur. Whole-grain and low-sodium cereals. Pita bread. Low-fat, low-sodium crackers. Whole-wheat flour tortillas. Meats and other proteins Skinless chicken or [place]. Ground chicken or [place]. Pork with fat trimmed off. Fish and seafood. Egg whites. Dried beans, peas, or lentils. Unsalted nuts, nut butters, and seeds. Unsalted canned beans. Lean cuts of beef with fat  trimmed off. Low-sodium, lean precooked or cured meat, such as sausages or meat loaves. Dairy Low-fat (1%) or fat-free (skim) milk. Reduced-fat, low-fat, or fat-free cheeses. Nonfat, low-sodium ricotta or cottage cheese. Low-fat or nonfat yogurt. Low-fat, low-sodium cheese. Fats and oils Soft margarine without trans fats. Vegetable oil. Reduced-fat, low-fat, or light mayonnaise and salad dressings (reduced-sodium). Canola, safflower, olive, avocado, soybean, and sunflower oils. Avocado. Seasonings and condiments Herbs. Spices. Seasoning mixes without salt. Other foods Unsalted popcorn and pretzels. Fat-free sweets. The items listed above may not be a complete list of foods and beverages you can eat. Contact a dietitian for more information. What foods should I avoid? Fruits Canned fruit in a light or heavy syrup. Fried fruit. Fruit in cream or butter sauce. Vegetables Creamed or fried vegetables. Vegetables in a cheese sauce. Regular canned vegetables (not low-sodium or reduced-sodium). Regular canned tomato sauce and paste (not low-sodium or reduced-sodium). Regular tomato and vegetable juice (not low-sodium or reduced-sodium). Angie Fava. Olives. Grains Baked goods made with fat, such as croissants, muffins, or some breads. Dry pasta or rice meal packs. Meats and other proteins Fatty cuts of meat. Ribs. Fried meat. Berniece Salines. Bologna, salami, and other precooked or cured meats, such as sausages or meat loaves. Fat from the back of a pig (fatback). Bratwurst. Salted nuts and seeds. Canned beans with added salt.  Canned or smoked fish. Whole eggs or egg yolks. Chicken or [place] with skin. Dairy Whole or 2% milk, cream, and half-and-half. Whole or full-fat cream cheese. Whole-fat or sweetened yogurt. Full-fat cheese. Nondairy creamers. Whipped toppings. Processed cheese and cheese spreads. Fats and oils Butter. Stick margarine. Lard. Shortening. Ghee. Bacon fat. Tropical oils, such as coconut, palm  kernel, or palm oil. Seasonings and condiments Onion salt, garlic salt, seasoned salt, table salt, and sea salt. Worcestershire sauce. Tartar sauce. Barbecue sauce. Teriyaki sauce. Soy sauce, including reduced-sodium. Steak sauce. Canned and packaged gravies. Fish sauce. Oyster sauce. Cocktail sauce. Store-bought horseradish. Ketchup. Mustard. Meat flavorings and tenderizers. Bouillon cubes. Hot sauces. Pre-made or packaged marinades. Pre-made or packaged taco seasonings. Relishes. Regular salad dressings. Other foods Salted popcorn and pretzels. The items listed above may not be a complete list of foods and beverages you should avoid. Contact a dietitian for more information. Where to find more information National Heart, Lung, and Blood Institute: https://wilson-eaton.com/ American Heart Association: www.heart.org Academy of Nutrition and Dietetics: www.eatright.[place]: www.kidney.org Summary The DASH eating plan is a healthy eating plan that has been shown to reduce high blood pressure (hypertension). It may also reduce your risk for type 2 diabetes, heart disease, and stroke. When on the DASH eating plan, aim to eat more fresh fruits and vegetables, whole grains, lean proteins, low-fat dairy, and heart-healthy fats. With the DASH eating plan, you should limit salt (sodium) intake to 2,300 mg a day. If you have hypertension, you may need to reduce your sodium intake to 1,500 mg a day. Work with your health care provider or dietitian to adjust your eating plan to your individual calorie needs. This information is not intended to replace advice given to you by your health care provider. Make sure you discuss any questions you have with your health care provider. Document Revised: 10/15/2019 Document Reviewed: 10/15/2019 Elsevier Patient Education  [place]

## 2022-12-02 ENCOUNTER — Other Ambulatory Visit: Payer: Self-pay | Admitting: Internal Medicine

## 2022-12-02 DIAGNOSIS — I5189 Other ill-defined heart diseases: Secondary | ICD-10-CM

## 2022-12-02 DIAGNOSIS — I10 Essential (primary) hypertension: Secondary | ICD-10-CM

## 2022-12-19 ENCOUNTER — Ambulatory Visit (HOSPITAL_COMMUNITY): Payer: Medicare Other | Attending: Cardiology

## 2022-12-19 DIAGNOSIS — I10 Essential (primary) hypertension: Secondary | ICD-10-CM

## 2022-12-19 DIAGNOSIS — I350 Nonrheumatic aortic (valve) stenosis: Secondary | ICD-10-CM | POA: Insufficient documentation

## 2022-12-19 LAB — ECHOCARDIOGRAM COMPLETE
AR max vel: 1.2 cm2
AV Area VTI: 1.22 cm2
AV Area mean vel: 1.2 cm2
AV Mean grad: 12.2 mmHg
AV Peak grad: 21.5 mmHg
Ao pk vel: 2.32 m/s
Area-P 1/2: 3.19 cm2
S' Lateral: 2.9 cm

## 2022-12-20 ENCOUNTER — Telehealth: Payer: Self-pay | Admitting: Internal Medicine

## 2022-12-20 ENCOUNTER — Encounter: Payer: Self-pay | Admitting: Pulmonary Disease

## 2022-12-20 ENCOUNTER — Ambulatory Visit: Payer: Medicare Other | Admitting: Pulmonary Disease

## 2022-12-20 VITALS — BP 134/56 | HR 71 | Temp 97.6°F | Ht 63.5 in | Wt 166.2 lb

## 2022-12-20 DIAGNOSIS — R918 Other nonspecific abnormal finding of lung field: Secondary | ICD-10-CM | POA: Diagnosis not present

## 2022-12-20 NOTE — Patient Instructions (Signed)
Will get a CT chest without contrast for follow-up of lung nodules Return to clinic in 1 to 2 months for review and plan for next steps.

## 2022-12-20 NOTE — Progress Notes (Signed)
Claudia Lara    [id number]    Dec 25, 1931  Primary Care Physician:Jones, Arvid Right, MD  Referring Physician: Janith Lima, MD [address] [place],  [place] [address]   Chief complaint: Follow-up for dyspnea, lung nodule  HPI: 89 y.o.  with history of aortic stenosis, exertional dyspnea, arthritis. Complains of progressive dyspnea for the past 10 years.  Symptoms are mostly on exertion.  She does not have symptoms at rest, no cough, wheezing, mucus production. She follows with Dr. Harrington Challenger for mild aortic stenosis. Echo shows stable aortic stenosis with diastolic dysfunction, elevated filling pressures.  She had PFTs which showed isolated reduction in diffusion capacity and is referred to pulmonary for further evaluation  She has chronic arthritis of the DIP, IP and MP joints.  Denies morning stiffness.  No raynauds, rash, dysphagia, choking on food. She has had recurrent issues with gallstones, biliary obstruction status post cholecystectomy in 2017 and recently underwent ERCP on 3/9 and 04/07/18.  She reports that her symptoms of dyspnea improve whenever the gallstones are removed.  She has been on nitrofurantoin recently for cystitis but not on a prolonged basis.  Pets: No pets, birds, farm animals Occupation: Retired Network engineer Exposures: No relevant exposures.  No leak, hot tub, Jacuzzi.  No down pillows or comforters Smoking history: Never smoker.  Exposed to secondhand smoke as a child no significant travel Travel history: No significant travel Relevant family history: No significant family history of lung issues.  Interim history: She is seen back in clinic after gap of 3 years.  Did not follow-up since she did not want to do anything about the nodule and is doing well in the interim.  Still has some dyspnea on exertion but no other issues.  Referred back by primary care for evaluation of her lung nodule.  Outpatient Encounter Medications as of 12/20/2022   Medication Sig   Calcium Carb-Cholecalciferol (CALCIUM CARBONATE-VITAMIN D3 PO) Take 1 tablet by mouth daily.    carvedilol (COREG) 12.5 MG tablet TAKE 1 TABLET BY MOUTH TWICE A DAY WITH FOOD   cholecalciferol (VITAMIN D) 1000 units tablet Take 1,000 Units by mouth daily.   empagliflozin (JARDIANCE) 10 MG TABS tablet Take 1 tablet (10 mg total) by mouth daily before breakfast.   methenamine (HIPREX) 1 g tablet Take 1 tablet (1 g total) by mouth 2 (two) times daily with a meal.   Multiple Vitamin (MULTIVITAMIN WITH MINERALS) TABS tablet Take 1 tablet by mouth daily.   Multiple Vitamins-Minerals (ZINC PO) Take by mouth.   torsemide (DEMADEX) 20 MG tablet Take 1 tablet by mouth daily, may take 1 extra tablet by mouth daily only as needed for increased edema or worsening shortness of breath   vitamin C (ASCORBIC ACID) 500 MG tablet Take 500 mg by mouth daily.   No facility-administered encounter medications on file as of 12/20/2022.   Physical Exam: Blood pressure 128/70, pulse 68, temperature 98 F (36.7 C), temperature source Temporal, height '5\' 5"'$  (1.651 m), weight 166 lb (75.3 kg), SpO2 98 %. Gen:      No acute distress HEENT:  EOMI, sclera anicteric Neck:     No masses; no thyromegaly Lungs:    Clear to auscultation bilaterally; normal respiratory effort CV:         Regular rate and rhythm; no murmurs Abd:      + bowel sounds; soft, non-tender; no palpable masses, no distension Ext:    No edema;  adequate peripheral perfusion Skin:      Warm and dry; no rash Neuro: alert and oriented x 3 Psych: normal mood and affect  Data Reviewed: Imaging: CT chest 03/17/2018-mild atelectasis at the bases. Small left pleural effusion. High-resolution CT 05/08/2018- 7 mm nodule in the right upper lobe.  Minimal basilar groundglass attenuation, septal thickening.  High-resolution CT 06/18/2019- stable interstitial changes at the bases.  Enlarging right upper lobe pulmonary nodule now measuring 9  mm  PET scan 07/02/2019- no uptake in the right upper lobe nodule SUV 0.9 or anywhere else  CT chest 01/10/2020-minimal increase in right upper lobe nodule.  No evidence of interstitial lung disease I have reviewed the images personally.  PFTs 04/03/2018 FVC 2.29 [95%], FEV1 1.79 [101%], F/F 78, TLC 86%, DLCO 51%, DLCO/VA 60% Isolated reduction in diffusion capacity.  Echocardiogram 02/18/2018- LVEF 65-70%, elevated LV filling pressure PA peak pressure 45, grade 1 diastolic dysfunction  Labs ANA, rheumatoid factor, CCP 04/27/2018-negative  Biodesix Nodify XL2 Test 07/15/2019 Pre test-17% risk of malignancy Posttest 5%, reduced risk of malignancy  Assessment:  Pulmonary nodule Last CT scan in 2021 reviewed with slightly enlarged pulmonary nodule  This is likely benign with negative biodesix blood test, negative PET scan though this may be limited by the small size of the nodule. She is adamant that she would not want to get surgery but may be interested in bronchoscopic biopsies and other therapies if needed Will get a follow-up CT scan to reevaluate.  Plan/Recommendations: - CT without contrast  Marshell Garfinkel MD [facility] 12/20/2022, 10:37 AM  CC:  Janith Lima, MD   Fay Records MD

## 2022-12-20 NOTE — Telephone Encounter (Signed)
Patient returning call for results. Please advise  

## 2022-12-20 NOTE — Telephone Encounter (Signed)
Patient notified of echo results and voiced understanding.

## 2023-01-13 ENCOUNTER — Ambulatory Visit (HOSPITAL_COMMUNITY)
Admission: RE | Admit: 2023-01-13 | Discharge: 2023-01-13 | Disposition: A | Discharge status: Home / Self Care | Payer: Medicare Other | Source: Ambulatory Visit | Attending: Pulmonary Disease | Admitting: Pulmonary Disease

## 2023-01-13 ENCOUNTER — Encounter (HOSPITAL_COMMUNITY): Payer: Self-pay

## 2023-01-13 DIAGNOSIS — R918 Other nonspecific abnormal finding of lung field: Secondary | ICD-10-CM | POA: Insufficient documentation

## 2023-01-14 NOTE — Progress Notes (Unsigned)
    Subjective:    Patient ID: Claudia Lara, female    DOB: 1932-05-14, 89 y.o.   MRN: [id number]      HPI Claudia Lara is here for No chief complaint on file.   ? UTI:  Her symptoms started  *** days ago.  She states dysuria, urinary frequency, urinary urgency, hematuria, abdominal pain, back pain, nausea, fever.  She denies other symptoms.     Ucx 10/09/22: Ecoli - pansensitive  Medications and allergies reviewed with patient and updated if appropriate.  Current Outpatient Medications on File Prior to Visit  Medication Sig Dispense Refill   Calcium Carb-Cholecalciferol (CALCIUM CARBONATE-VITAMIN D3 PO) Take 1 tablet by mouth daily.      carvedilol (COREG) 12.5 MG tablet TAKE 1 TABLET BY MOUTH TWICE A DAY WITH FOOD 180 tablet 0   cholecalciferol (VITAMIN D) 1000 units tablet Take 1,000 Units by mouth daily.     empagliflozin (JARDIANCE) 10 MG TABS tablet Take 1 tablet (10 mg total) by mouth daily before breakfast. 90 tablet 1   methenamine (HIPREX) 1 g tablet Take 1 tablet (1 g total) by mouth 2 (two) times daily with a meal. 180 tablet 1   Multiple Vitamin (MULTIVITAMIN WITH MINERALS) TABS tablet Take 1 tablet by mouth daily.     Multiple Vitamins-Minerals (ZINC PO) Take by mouth.     torsemide (DEMADEX) 20 MG tablet Take 1 tablet by mouth daily, may take 1 extra tablet by mouth daily only as needed for increased edema or worsening shortness of breath 120 tablet 3   vitamin C (ASCORBIC ACID) 500 MG tablet Take 500 mg by mouth daily.     No current facility-administered medications on file prior to visit.    Review of Systems     Objective:  There were no vitals filed for this visit. BP Readings from Last 3 Encounters:  12/20/22 (!) 134/56  11/22/22 (!) 142/60  11/12/22 (!) 150/60   Wt Readings from Last 3 Encounters:  12/20/22 166 lb 3.2 oz (75.4 kg)  11/22/22 165 lb 12.8 oz (75.2 kg)  11/12/22 166 lb 12.8 oz (75.7 kg)   There is no height or weight on file to  calculate BMI.    Physical Exam         Assessment & Plan:    See Problem List for Assessment and Plan of chronic medical problems.

## 2023-01-14 NOTE — Patient Instructions (Addendum)
Medications changes include :   cephalexin 500 mg twice daily for one week   Take the antibiotic as prescribed.  Take tylenol if needed.     Increase your water intake.   Call if no improvement     Urinary Tract Infection, Adult A urinary tract infection (UTI) is an infection of any part of the urinary tract, which includes the kidneys, ureters, bladder, and urethra. These organs make, store, and get rid of urine in the body. UTI can be a bladder infection (cystitis) or kidney infection (pyelonephritis). What are the causes? This infection may be caused by fungi, viruses, or bacteria. Bacteria are the most common cause of UTIs. This condition can also be caused by repeated incomplete emptying of the bladder during urination. What increases the risk? This condition is more likely to develop if: You ignore your need to urinate or hold urine for long periods of time. You do not empty your bladder completely during urination. You wipe back to front after urinating or having a bowel movement, if you are female. You are uncircumcised, if you are female. You are constipated. You have a urinary catheter that stays in place (indwelling). You have a weak defense (immune) system. You have a medical condition that affects your bowels, kidneys, or bladder. You have diabetes. You take antibiotic medicines frequently or for long periods of time, and the antibiotics no longer work well against certain types of infections (antibiotic resistance). You take medicines that irritate your urinary tract. You are exposed to chemicals that irritate your urinary tract. You are female.  What are the signs or symptoms? Symptoms of this condition include: Fever. Frequent urination or passing small amounts of urine frequently. Needing to urinate urgently. Pain or burning with urination. Urine that smells bad or unusual. Cloudy urine. Pain in the lower abdomen or back. Trouble urinating. Blood in the  urine. Vomiting or being less hungry than normal. Diarrhea or abdominal pain. Vaginal discharge, if you are female.  How is this diagnosed? This condition is diagnosed with a medical history and physical exam. You will also need to provide a urine sample to test your urine. Other tests may be done, including: Blood tests. Sexually transmitted disease (STD) testing.  If you have had more than one UTI, a cystoscopy or imaging studies may be done to determine the cause of the infections. How is this treated? Treatment for this condition often includes a combination of two or more of the following: Antibiotic medicine. Other medicines to treat less common causes of UTI. Over-the-counter medicines to treat pain. Drinking enough water to stay hydrated.  Follow these instructions at home: Take over-the-counter and prescription medicines only as told by your health care provider. If you were prescribed an antibiotic, take it as told by your health care provider. Do not stop taking the antibiotic even if you start to feel better. Avoid alcohol, caffeine, tea, and carbonated beverages. They can irritate your bladder. Drink enough fluid to keep your urine clear or pale yellow. Keep all follow-up visits as told by your health care provider. This is important. Make sure to: Empty your bladder often and completely. Do not hold urine for long periods of time. Empty your bladder before and after sex. Wipe from front to back after a bowel movement if you are female. Use each tissue one time when you wipe. Contact a health care provider if: You have back pain. You have a fever. You feel nauseous or vomit. Your  symptoms do not get better after 3 days. Your symptoms go away and then return. Get help right away if: You have severe back pain or lower abdominal pain. You are vomiting and cannot keep down any medicines or water. This information is not intended to replace advice given to you by your  health care provider. Make sure you discuss any questions you have with your health care provider. Document Released: 08/21/2005 Document Revised: 04/24/2016 Document Reviewed: 10/02/2015 Elsevier Interactive Patient Education  [employer].

## 2023-01-15 ENCOUNTER — Encounter: Payer: Self-pay | Admitting: Internal Medicine

## 2023-01-15 ENCOUNTER — Ambulatory Visit (INDEPENDENT_AMBULATORY_CARE_PROVIDER_SITE_OTHER): Payer: Medicare Other | Admitting: Internal Medicine

## 2023-01-15 ENCOUNTER — Telehealth: Payer: Self-pay | Admitting: Pulmonary Disease

## 2023-01-15 VITALS — BP 122/70 | HR 76 | Temp 98.4°F | Ht 63.5 in | Wt 169.0 lb

## 2023-01-15 DIAGNOSIS — N3000 Acute cystitis without hematuria: Secondary | ICD-10-CM

## 2023-01-15 DIAGNOSIS — R300 Dysuria: Secondary | ICD-10-CM | POA: Diagnosis not present

## 2023-01-15 DIAGNOSIS — R918 Other nonspecific abnormal finding of lung field: Secondary | ICD-10-CM

## 2023-01-15 DIAGNOSIS — I10 Essential (primary) hypertension: Secondary | ICD-10-CM

## 2023-01-15 LAB — POCT URINALYSIS DIPSTICK
Bilirubin, UA: NEGATIVE
Blood, UA: NEGATIVE
Glucose, UA: NEGATIVE
Ketones, UA: NEGATIVE
Nitrite, UA: NEGATIVE
Protein, UA: POSITIVE — AB
Spec Grav, UA: 1.02 (ref 1.010–1.025)
Urobilinogen, UA: 0.2 E.U./dL
pH, UA: 6 (ref 5.0–8.0)

## 2023-01-15 MED ORDER — CEPHALEXIN 500 MG PO CAPS: 500.0000 mg | ORAL_CAPSULE | Freq: Two times a day (BID) | ORAL | 0 refills | Status: DC

## 2023-01-15 NOTE — Telephone Encounter (Signed)
Agree with PET scan.  Please order the study.

## 2023-01-15 NOTE — Telephone Encounter (Signed)
PET order placed.  ATC patient with Dr. Matilde Bash recommendations.  Left detailed message with call back number (DPR).  Nothing further at this time.

## 2023-01-15 NOTE — Telephone Encounter (Signed)
Patient requesting to speak with a nurse regarding results of CT scan from 2/19. Recommendations say, "Repeat PET is recommended as malignancy cannot be excluded. " Patient would like to know if she should get this PET scan prior to her appointment with Dr. Vaughan Browner on 01/31/2023.

## 2023-01-15 NOTE — Telephone Encounter (Signed)
Dr. Vaughan Browner can you please advise on patients CT results. Does she need to complete PET scan before ov in march

## 2023-01-15 NOTE — Addendum Note (Signed)
Addended by: Elton Sin on: 01/15/2023 02:52 PM   Modules accepted: Orders

## 2023-01-17 LAB — CULTURE, URINE COMPREHENSIVE

## 2023-01-20 ENCOUNTER — Ambulatory Visit: Payer: Medicare Other | Admitting: Nurse Practitioner

## 2023-01-22 DIAGNOSIS — Z96652 Presence of left artificial knee joint: Secondary | ICD-10-CM | POA: Diagnosis not present

## 2023-01-30 ENCOUNTER — Encounter (HOSPITAL_COMMUNITY)
Admission: RE | Admit: 2023-01-30 | Discharge: 2023-01-30 | Disposition: A | Discharge status: Home / Self Care | Payer: Medicare Other | Source: Ambulatory Visit | Attending: Pulmonary Disease | Admitting: Pulmonary Disease

## 2023-01-30 DIAGNOSIS — R918 Other nonspecific abnormal finding of lung field: Secondary | ICD-10-CM | POA: Insufficient documentation

## 2023-01-30 DIAGNOSIS — R911 Solitary pulmonary nodule: Secondary | ICD-10-CM | POA: Diagnosis not present

## 2023-01-30 LAB — GLUCOSE, CAPILLARY: Glucose-Capillary: 113 mg/dL — ABNORMAL HIGH (ref 70–99)

## 2023-01-30 MED ORDER — FLUDEOXYGLUCOSE F - 18 (FDG) INJECTION
8.3900 | Freq: Once | INTRAVENOUS | Status: AC
  Administered 2023-01-30: 8.39 via INTRAVENOUS

## 2023-01-31 ENCOUNTER — Ambulatory Visit: Payer: Medicare Other | Admitting: Pulmonary Disease

## 2023-01-31 ENCOUNTER — Encounter: Payer: Self-pay | Admitting: Pulmonary Disease

## 2023-01-31 VITALS — BP 132/58 | HR 85 | Temp 97.6°F | Ht 63.5 in | Wt 169.2 lb

## 2023-01-31 DIAGNOSIS — R918 Other nonspecific abnormal finding of lung field: Secondary | ICD-10-CM | POA: Diagnosis not present

## 2023-01-31 DIAGNOSIS — J849 Interstitial pulmonary disease, unspecified: Secondary | ICD-10-CM

## 2023-01-31 NOTE — Patient Instructions (Signed)
We had reviewed his CT scan with a slowly growing nodule which could be a malignancy Based on our discussion we are going to leave this alone and just monitor for now Return to clinic in 1 year

## 2023-01-31 NOTE — Progress Notes (Signed)
Claudia Lara    [id number]    03-10-1932  Primary Care Physician:Jones, Arvid Right, MD  Referring Physician: Janith Lima, MD [address] [place],  [place] [address]   Chief complaint: Follow-up for dyspnea, lung nodule  HPI: 89 y.o.  with history of aortic stenosis, exertional dyspnea, arthritis. Complains of progressive dyspnea for the past 10 years.  Symptoms are mostly on exertion.  She does not have symptoms at rest, no cough, wheezing, mucus production. She follows with Dr. Harrington Challenger for mild aortic stenosis. Echo shows stable aortic stenosis with diastolic dysfunction, elevated filling pressures.  She had PFTs which showed isolated reduction in diffusion capacity and is referred to pulmonary for further evaluation  She has chronic arthritis of the DIP, IP and MP joints.  Denies morning stiffness.  No raynauds, rash, dysphagia, choking on food. She has had recurrent issues with gallstones, biliary obstruction status post cholecystectomy in 2017 and recently underwent ERCP on 3/9 and 04/07/18.  She reports that her symptoms of dyspnea improve whenever the gallstones are removed.  She has been on nitrofurantoin recently for cystitis but not on a prolonged basis.  Pets: No pets, birds, farm animals Occupation: Retired Network engineer Exposures: No relevant exposures.  No leak, hot tub, Jacuzzi.  No down pillows or comforters Smoking history: Never smoker.  Exposed to secondhand smoke as a child no significant travel Travel history: No significant travel Relevant family history: No significant family history of lung issues.  Interim history: She is seen back in clinic after gap of 3 years.  Did not follow-up since she did not want to do anything about the nodule and is doing well in the interim.  Still has some dyspnea on exertion but no other issues.  Referred back by primary care for evaluation of her lung nodule.  She subsequently had a CT which showed enlarging right lung  nodule and a PET scan.  She is here for review  Outpatient Encounter Medications as of 01/31/2023  Medication Sig   Calcium Carb-Cholecalciferol (CALCIUM CARBONATE-VITAMIN D3 PO) Take 1 tablet by mouth daily.    carvedilol (COREG) 12.5 MG tablet TAKE 1 TABLET BY MOUTH TWICE A DAY WITH FOOD   cephALEXin (KEFLEX) 500 MG capsule Take 1 capsule (500 mg total) by mouth 2 (two) times daily.   cholecalciferol (VITAMIN D) 1000 units tablet Take 1,000 Units by mouth daily.   methenamine (HIPREX) 1 g tablet Take 1 tablet (1 g total) by mouth 2 (two) times daily with a meal.   Multiple Vitamin (MULTIVITAMIN WITH MINERALS) TABS tablet Take 1 tablet by mouth daily.   Multiple Vitamins-Minerals (ZINC PO) Take by mouth.   torsemide (DEMADEX) 20 MG tablet Take 1 tablet by mouth daily, may take 1 extra tablet by mouth daily only as needed for increased edema or worsening shortness of breath   vitamin C (ASCORBIC ACID) 500 MG tablet Take 500 mg by mouth daily.   [DISCONTINUED] empagliflozin (JARDIANCE) 10 MG TABS tablet Take 1 tablet (10 mg total) by mouth daily before breakfast. (Patient not taking: Reported on 01/15/2023)   No facility-administered encounter medications on file as of 01/31/2023.   Physical Exam: Blood pressure (!) 132/58, pulse 85, temperature 97.6 F (36.4 C), temperature source Oral, height 5' 3.5" (1.613 m), weight 169 lb 3.2 oz (76.7 kg), SpO2 96 %. Gen:      No acute distress HEENT:  EOMI, sclera anicteric Neck:     No  masses; no thyromegaly Lungs:    Clear to auscultation bilaterally; normal respiratory effort CV:         Regular rate and rhythm; no murmurs Abd:      + bowel sounds; soft, non-tender; no palpable masses, no distension Ext:    No edema; adequate peripheral perfusion Skin:      Warm and dry; no rash Neuro: alert and oriented x 3 Psych: normal mood and affect  Data Reviewed: Imaging: CT chest 03/17/2018-mild atelectasis at the bases. Small left pleural  effusion. High-resolution CT 05/08/2018- 7 mm nodule in the right upper lobe.  Minimal basilar groundglass attenuation, septal thickening.  High-resolution CT 06/18/2019- stable interstitial changes at the bases.  Enlarging right upper lobe pulmonary nodule now measuring 9 mm  PET scan 07/02/2019- no uptake in the right upper lobe nodule SUV 0.9 or anywhere else  CT chest 01/10/2020- minimal increase in right upper lobe nodule.  No evidence of interstitial lung disease  CT chest 01/13/2023- enlargement of right upper lobe nodule, basilar subpleural reticulation groundglass and traction suggestive of possible UIP  PET scan 01/30/2023-final read is pending.  Lung nodule shows mild uptake by my review. I had reviewed the images personally.   PFTs 04/03/2018 FVC 2.29 [95%], FEV1 1.79 [101%], F/F 78, TLC 86%, DLCO 51%, DLCO/VA 60% Isolated reduction in diffusion capacity.  Echocardiogram 02/18/2018- LVEF 65-70%, elevated LV filling pressure PA peak pressure 45, grade 1 diastolic dysfunction  Labs ANA, rheumatoid factor, CCP 04/27/2018-negative  Biodesix Nodify XL2 Test 07/15/2019 Pre test-17% risk of malignancy Posttest 5%, reduced risk of malignancy  Assessment:  Pulmonary nodule Has slow-growing right upper lobe lung nodule  Initially felt to be benign with negative biodesix blood test, negative PET scan in 2020 though this may be limited by the small size of the nodule.  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  We had a long discussion today about further workup with bronchoscopic biopsy.  She is not interested in any treatment with surgery, chemotherapy or radiation and just wants to have conservative management as she feels at her age she does not want any aggressive therapy.  I agree with her that the best approach would be to leave this alone as it is growing slowly and may not bother her for the rest of her  life  Interstitial lung disease Has mild changes of pulmonary fibrosis and probable UIP pattern.  As with the lung nodule I do not think we should treat this aggressively as antifibrotic therapy has significant side effects.  Plan/Recommendations: Follow-up in 1 year  Marshell Garfinkel MD [facility] 01/31/2023, 10:50 AM  CC:  Janith Lima, MD   Fay Records MD

## 2023-02-10 NOTE — Progress Notes (Unsigned)
Office Visit    Patient Name: Claudia Lara Date of Encounter: 02/12/2023  Primary Care Provider:  Janith Lima, MD Primary Cardiologist:  Dorris Carnes, MD Primary Electrophysiologist: None  Chief Complaint    Claudia Lara is a 89 y.o. female with PMH of HFpEF, interstitial lung disease, GERD, aortic sclerosis, hypertension, carotid artery stenosis who presents today for 64-month follow-up of  shortness of breath.   Past Medical History    Past Medical History:  Diagnosis Date   ANEMIA 08/22/2009   Aortic stenosis    AR (aortic regurgitation) 01/2019   Mild to Moderate, Noted on ECHO   CAROTID ARTERY STENOSIS [id number]   Complication of anesthesia    very hard to wake up from surgery   DYSPNEA ON EXERTION 11/16/2007   Gallstones    GEN OSTEOARTHROSIS INVOLVING MULTIPLE SITES 11/11/2008   GERD (gastroesophageal reflux disease)    Grade I diastolic dysfunction 123456   Noted on ECHO   Heart murmur    HEMORRHOIDS, INTERNAL    surgery was in the 90's (per patient)   History of blood transfusion    after hip and knee replacements   NECK PAIN, ACUTE 12/28/2009   PERIPHERAL EDEMA 10/06/2007   PONV (postoperative nausea and vomiting)    Pulmonary nodule 06/18/2019   noted on CT Chest    PVD 10/06/2007   Unspecified essential hypertension 10/19/2007   Past Surgical History:  Procedure Laterality Date   ABDOMINAL HYSTERECTOMY     BIOPSY  08/06/2019   Procedure: BIOPSY;  Surgeon: Gatha Mayer, MD;  Location: [facility];  Service: Gastroenterology;;   [name]     bilat   CARPAL [place]  01/29/2016   at [place] (ERCP) WITH PROPOFOL N/A 12/01/2015   Procedure: ENDOSCOPIC RETROGRADE CHOLANGIOPANCREATOGRAPHY (ERCP) WITH PROPOFOL;  Surgeon: [name], MD;  Location: [facility];  Service: Endoscopy;  Laterality: N/A;   ENDOSCOPIC RETROGRADE  CHOLANGIOPANCREATOGRAPHY (ERCP) WITH PROPOFOL N/A 02/01/2016   Procedure: ENDOSCOPIC RETROGRADE CHOLANGIOPANCREATOGRAPHY (ERCP) WITH PROPOFOL;  Surgeon: [name], MD;  Location: [facility];  Service: Endoscopy;  Laterality: N/A;   ENDOSCOPIC RETROGRADE CHOLANGIOPANCREATOGRAPHY (ERCP) WITH PROPOFOL N/A 04/07/2018   Procedure: ENDOSCOPIC RETROGRADE CHOLANGIOPANCREATOGRAPHY (ERCP) WITH PROPOFOL;  Surgeon: [name], MD;  Location: [facility];  Service: Endoscopy;  Laterality: N/A;   ESOPHAGOGASTRODUODENOSCOPY (EGD) WITH PROPOFOL N/A 08/06/2019   Procedure: ESOPHAGOGASTRODUODENOSCOPY (EGD) WITH PROPOFOL;  Surgeon: Gatha Mayer, MD;  Location: [facility];  Service: Gastroenterology;  Laterality: N/A;   HAMMER TOE SURGERY     HEMORRHOID SURGERY     Hyperplastic colon polyps, removed  2007   By Dr. Penelope Coop   JOINT REPLACEMENT  2011   right knee   OOPHORECTOMY     REMOVAL OF STONES  04/07/2018   Procedure: REMOVAL OF STONES;  Surgeon: [name], MD;  Location: [facility];  Service: Endoscopy;;   ROTATOR CUFF REPAIR  2004   left (Dr. Durward Fortes)   [place]  04/07/2018   Procedure: Joan Mayans;  Surgeon: [name], MD;  Location: [facility];  Service: Endoscopy;;   TOTAL HIP ARTHROPLASTY Right 11/26/2016   Procedure: RIGHT TOTAL HIP ARTHROPLASTY;  Surgeon: Garald Balding, MD;  Location: [facility];  Service: Orthopedics;  Laterality: Right;   TOTAL KNEE ARTHROPLASTY Right    TOTAL KNEE ARTHROPLASTY Left 07/26/2019   Procedure: TOTAL KNEE ARTHROPLASTY;  Surgeon:  Gaynelle Arabian, MD;  Location: [facility];  Service: Orthopedics;  Laterality: Left;  79min    Allergies  Allergies  Allergen Reactions   Sulfonamide Derivatives Rash    Childhood reaction    History of Present Illness    Claudia Lara  is a 89 year old female with the above mention past medical history who presents today for follow-up of shortness of breath.  Claudia Lara was initially followed by Dr.  Mare Ferrari and has been followed by Dr. Harrington Challenger since 2017.  She has a history of mild aortic sclerosis and 2D echo was completed in 2019 showing EF of 65-70%, grade 1 DD with no RWMA and mild MR with aortic valve mean gradient 12 mmHg.  She had a CT of the chest completed 05/2019 showing pulmonary nodules with groundglass opacities. She was last seen by Dr. Harrington Challenger in 2021 for follow-up of dyspnea.  During her visit patient reported breathing was stable.  She underwent Lexiscan Myoview for complaint of substernal chest discomfort and fatigue in 02/2021 that was negative for ischemia.  She has been followed by hypertension clinic for management of blood pressure.  She was seen by her PCP on 11/12/2022 for complaint of shortness of breath.  She was started on Jardiance 10 mg for management of LV dysfunction.  She was seen on 11/22/2022 with complaint of exertional shortness of breath.  Her blood pressures were initially elevated at 142/60 and was 136/64 on recheck.  She was euvolemic on exam and patient was advised to take an extra 20 mg of torsemide for shortness of breath.  She was also referred to pulmonology for further evaluation.  She had a enlarging right lung nodule and completed a PET scan which showed an indolent neoplasm.  She is elected to treat conservatively and declined bronchoscopy biopsy and chemotherapy or radiation.  Claudia Lara presents today for 6-month follow-up.  Since last being seen in the office patient reports that she is doing much better and denies any increase shortness of breath since previous visit.  She is currently being followed by pulmonology as noted above.  She is continuing to stay active in her garden and is also cutting her grass as needed.  She reports some shortness of breath with this activity and takes multiple breaks to rest.  Her blood pressure today is well-controlled at 136/60 and she is tolerating her blood pressure medicines without any adverse reactions.  She is euvolemic on  exam abstains from excess salt in her diet.  Patient denies chest pain, palpitations, dyspnea, PND, orthopnea, nausea, vomiting, dizziness, syncope, edema, weight gain, or early satiety.  Home Medications    Current Outpatient Medications  Medication Sig Dispense Refill   Calcium Carb-Cholecalciferol (CALCIUM CARBONATE-VITAMIN D3 PO) Take 1 tablet by mouth daily.      carvedilol (COREG) 12.5 MG tablet TAKE 1 TABLET BY MOUTH TWICE A DAY WITH FOOD 180 tablet 0   cholecalciferol (VITAMIN D) 1000 units tablet Take 1,000 Units by mouth daily.     ferrous sulfate 325 (65 FE) MG tablet Take 325 mg by mouth daily with breakfast.     methenamine (HIPREX) 1 g tablet Take 1 tablet (1 g total) by mouth 2 (two) times daily with a meal. 180 tablet 1   Multiple Vitamin (MULTIVITAMIN WITH MINERALS) TABS tablet Take 1 tablet by mouth daily.     Multiple Vitamins-Minerals (ZINC PO) Take by mouth.     torsemide (DEMADEX) 20 MG tablet Take 1 tablet by mouth  daily, may take 1 extra tablet by mouth daily only as needed for increased edema or worsening shortness of breath 120 tablet 3   vitamin C (ASCORBIC ACID) 500 MG tablet Take 500 mg by mouth daily.     No current facility-administered medications for this visit.     Review of Systems  Please see the history of present illness.    (+) Shortness of breath  All other systems reviewed and are otherwise negative except as noted above.  Physical Exam    Wt Readings from Last 3 Encounters:  02/12/23 168 lb (76.2 kg)  01/31/23 169 lb 3.2 oz (76.7 kg)  01/15/23 169 lb (76.7 kg)   VS: Vitals:   02/12/23 1522  BP: 136/60  Pulse: 75  SpO2: 97%  ,Body mass index is 29.29 kg/m.  Constitutional:      Appearance: Healthy appearance. Not in distress.  Neck:     Vascular: JVD normal.  Pulmonary:     Effort: Pulmonary effort is normal.     Breath sounds: No wheezing. No rales. Diminished in the bases Cardiovascular:     Normal rate. Regular rhythm.  Normal S1. Normal S2.      Murmurs: There is no murmur.  Edema:    Peripheral edema absent.  Abdominal:     Palpations: Abdomen is soft non tender. There is no hepatomegaly.  Skin:    General: Skin is warm and dry.  Neurological:     General: No focal deficit present.     Mental Status: Alert and oriented to person, place and time.     Cranial Nerves: Cranial nerves are intact.  EKG/LABS/ Recent Cardiac Studies    ECG personally reviewed by me today -none completed today     Lab Results  Component Value Date   WBC 9.3 11/12/2022   HGB 10.6 (L) 11/12/2022   HCT 29.7 (L) 11/12/2022   MCV 86.3 11/12/2022   PLT 239.0 11/12/2022   Lab Results  Component Value Date   CREATININE 1.35 (H) 11/12/2022   BUN 31 (H) 11/12/2022   NA 139 11/12/2022   K 4.6 11/12/2022   CL 103 11/12/2022   CO2 27 11/12/2022   Lab Results  Component Value Date   ALT 12 02/14/2022   AST 19 02/14/2022   ALKPHOS 68 02/14/2022   BILITOT 1.7 (H) 02/14/2022   Lab Results  Component Value Date   CHOL 132 11/12/2022   HDL 53.90 11/12/2022   LDLCALC 54 11/12/2022   LDLDIRECT 56.0 07/08/2018   TRIG 120.0 11/12/2022   CHOLHDL 2 11/12/2022    Lab Results  Component Value Date   HGBA1C 4.8 06/08/2019    Cardiac Studies & Procedures     STRESS TESTS  MYOCARDIAL PERFUSION IMAGING 03/07/2021  Narrative  The left ventricular ejection fraction is normal (55-65%).  Nuclear stress EF: 65%.  There was no ST segment deviation noted during stress.  The study is normal.  This is a low risk study.  No ischemia or infarction on perfusion images. Normal wall motion.   ECHOCARDIOGRAM  ECHOCARDIOGRAM COMPLETE 12/19/2022  Narrative ECHOCARDIOGRAM REPORT    Patient Name:   Claudia Lara Date of Exam: 12/19/2022 Medical Rec #:  [id number]      Height:       64.0 in Accession #:    [id number]     Weight:       165.8 lb Date of Birth:  13-Apr-1932      BSA:  1.806 m Patient Age:    75 years        BP:           142/60 mmHg Patient Gender: F              HR:           76 bpm. Exam Location:  [address]  Procedure: 2D Echo, Cardiac Doppler and Color Doppler  Indications:    I35.0 Aortic Stenosis  History:        Patient has prior history of Echocardiogram examinations, most recent 02/18/2018. Signs/Symptoms:Murmur, Edema and Dyspnea; Risk Factors:Hypertension.  Sonographer:    Cresenciano Lick RDCS Referring Phys: [id number] Junius Creamer Kare Dado  IMPRESSIONS   1. Left ventricular ejection fraction, by estimation, is 60 to 65%. The left ventricle has normal function. The left ventricle has no regional wall motion abnormalities. Left ventricular diastolic parameters are consistent with Grade II diastolic dysfunction (pseudonormalization). Elevated left ventricular end-diastolic pressure. 2. Right ventricular systolic function is normal. The right ventricular size is normal. 3. Left atrial size was mildly dilated. 4. The mitral valve is normal in structure. Trivial mitral valve regurgitation. No evidence of mitral stenosis. 5. AVA is underestimated due to small LVOT diameter measurement. . The aortic valve is tricuspid. Aortic valve regurgitation is not visualized. Aortic valve sclerosis/calcification is present, without any evidence of aortic stenosis. Aortic valve mean gradient measures 12.2 mmHg. Aortic valve Vmax measures 2.32 m/s. 6. The inferior vena cava is normal in size with greater than 50% respiratory variability, suggesting right atrial pressure of 3 mmHg.  FINDINGS Left Ventricle: Left ventricular ejection fraction, by estimation, is 60 to 65%. The left ventricle has normal function. The left ventricle has no regional wall motion abnormalities. The left ventricular internal cavity size was normal in size. There is no left ventricular hypertrophy. Left ventricular diastolic parameters are consistent with Grade II diastolic dysfunction (pseudonormalization). Elevated  left ventricular end-diastolic pressure.  Right Ventricle: The right ventricular size is normal. No increase in right ventricular wall thickness. Right ventricular systolic function is normal.  Left Atrium: Left atrial size was mildly dilated.  Right Atrium: Right atrial size was normal in size.  Pericardium: There is no evidence of pericardial effusion.  Mitral Valve: The mitral valve is normal in structure. Trivial mitral valve regurgitation. No evidence of mitral valve stenosis.  Tricuspid Valve: The tricuspid valve is normal in structure. Tricuspid valve regurgitation is trivial. No evidence of tricuspid stenosis.  Aortic Valve: AVA is underestimated due to small LVOT diameter measurement. The aortic valve is tricuspid. Aortic valve regurgitation is not visualized. Aortic valve sclerosis/calcification is present, without any evidence of aortic stenosis. Aortic valve mean gradient measures 12.2 mmHg. Aortic valve peak gradient measures 21.5 mmHg. Aortic valve area, by VTI measures 1.22 cm.  Pulmonic Valve: The pulmonic valve was normal in structure. Pulmonic valve regurgitation is trivial. No evidence of pulmonic stenosis.  Aorta: The aortic root is normal in size and structure.  Venous: The inferior vena cava is normal in size with greater than 50% respiratory variability, suggesting right atrial pressure of 3 mmHg.  IAS/Shunts: No atrial level shunt detected by color flow Doppler.   LEFT VENTRICLE PLAX 2D LVIDd:         4.10 cm   Diastology LVIDs:         2.90 cm   LV e' medial:    5.38 cm/s LV PW:         1.00 cm  LV E/e' medial:  25.8 LV IVS:        1.00 cm   LV e' lateral:   7.18 cm/s LVOT diam:     1.60 cm   LV E/e' lateral: 19.4 LV SV:         63 LV SV Index:   35 LVOT Area:     2.01 cm   RIGHT VENTRICLE             IVC RV Basal diam:  3.60 cm     IVC diam: 1.20 cm RV S prime:     13.90 cm/s TAPSE (M-mode): 2.1 cm  LEFT ATRIUM             Index        RIGHT  ATRIUM           Index LA diam:        5.20 cm 2.88 cm/m   RA Area:     12.80 cm LA Vol (A2C):   71.2 ml 39.41 ml/m  RA Volume:   32.00 ml  17.71 ml/m LA Vol (A4C):   54.2 ml 30.00 ml/m LA Biplane Vol: 64.3 ml 35.59 ml/m AORTIC VALVE AV Area (Vmax):    1.20 cm AV Area (Vmean):   1.20 cm AV Area (VTI):     1.22 cm AV Vmax:           231.80 cm/s AV Vmean:          161.400 cm/s AV VTI:            0.514 m AV Peak Grad:      21.5 mmHg AV Mean Grad:      12.2 mmHg LVOT Vmax:         138.00 cm/s LVOT Vmean:        96.267 cm/s LVOT VTI:          0.313 m LVOT/AV VTI ratio: 0.61  AORTA Ao Root diam: 2.80 cm Ao Asc diam:  3.40 cm  MITRAL VALVE                TRICUSPID VALVE MV Area (PHT): 3.19 cm     TR Peak grad:   31.1 mmHg MV Decel Time: 238 msec     TR Vmax:        279.00 cm/s MV E velocity: 139.00 cm/s MV A velocity: 145.50 cm/s  SHUNTS MV E/A ratio:  0.96         Systemic VTI:  0.31 m Systemic Diam: 1.60 cm  Fransico Him MD Electronically signed by Fransico Him MD Signature Date/Time: 12/19/2022/3:18:05 PM    Final             Assessment & Plan    1.  Dyspnea on exertion: - She reports that her shortness of breath occurs with activity and is relieved with rest. -She is currently on torsemide 20 mg daily for increased swelling. -She was advised to take extra 20 mg of torsemide if shortness of breath   2.  Essential hypertension: -Patient's blood pressure today was initially elevated at 142/60 and was 136/60 on recheck -Continue carvedilol 12.5 mg twice daily   3.  Mild aortic stenosis: -2D echo will be updated to evaluate status of aortic stenosis -Continue good blood pressure control with carvedilol 12.5 mg twice daily   4.  HFpEF: -Patient's last 2D echo was completed in 2019 with EF of 65-70% and grade 1 DD.  2D echo to be repeated as noted  above -NYHA II-III -Today patient is euvolemic on exam with trace lower extremity edema -Continue GDMT with  Jardiance 10 mg, carvedilol 12.5 mg twice daily -Continue torsemide 20 mg daily with 20 mg as needed for shortness of breath and excess swelling  -Low sodium diet, fluid restriction <2L, and daily weights encouraged. Educated to contact our office for weight gain of 2 lbs overnight or 5 lbs in one week.      5.  Neoplasm of the lung: -Scan of right lung nodule completed and PET scan showed an indolent neoplasm -She is elected to treat conservatively without any treatment at this time.  Disposition: Follow-up with Dorris Carnes, MD or APP in PRN months    Medication Adjustments/Labs and Tests Ordered: Current medicines are reviewed at length with the patient today.  Concerns regarding medicines are outlined above.   Signed, Mable Fill, Marissa Nestle, NP 02/12/2023, 4:00 PM [facility]  Note:  This document was prepared using Dragon voice recognition software and may include unintentional dictation errors.

## 2023-02-12 ENCOUNTER — Ambulatory Visit: Payer: Medicare Other | Attending: Nurse Practitioner | Admitting: Nurse Practitioner

## 2023-02-12 ENCOUNTER — Encounter: Payer: Self-pay | Admitting: Nurse Practitioner

## 2023-02-12 VITALS — BP 136/60 | HR 75 | Ht 63.5 in | Wt 168.0 lb

## 2023-02-12 DIAGNOSIS — I5032 Chronic diastolic (congestive) heart failure: Secondary | ICD-10-CM

## 2023-02-12 DIAGNOSIS — J849 Interstitial pulmonary disease, unspecified: Secondary | ICD-10-CM | POA: Diagnosis not present

## 2023-02-12 DIAGNOSIS — R0609 Other forms of dyspnea: Secondary | ICD-10-CM | POA: Diagnosis not present

## 2023-02-12 DIAGNOSIS — I350 Nonrheumatic aortic (valve) stenosis: Secondary | ICD-10-CM | POA: Diagnosis not present

## 2023-02-12 DIAGNOSIS — I10 Essential (primary) hypertension: Secondary | ICD-10-CM | POA: Diagnosis not present

## 2023-02-12 NOTE — Patient Instructions (Signed)
Medication Instructions:  Your physician recommends that you continue on your current medications as directed. Please refer to the Current Medication list given to you today. *If you need a refill on your cardiac medications before your next appointment, please call your pharmacy*   Lab Work: None ordered If you have labs (blood work) drawn today and your tests are completely normal, you will receive your results only by: [place] (if you have MyChart) OR A paper copy in the mail If you have any lab test that is abnormal or we need to change your treatment, we will call you to review the results.   Testing/Procedures: None ordered   Follow-Up: At [facility], you and your health needs are our priority.  As part of our continuing mission to provide you with exceptional heart care, we have created designated Provider Care Teams.  These Care Teams include your primary Cardiologist (physician) and Advanced Practice Providers (APPs -  Physician Assistants and Nurse Practitioners) who all work together to provide you with the care you need, when you need it.  We recommend signing up for the patient portal called "MyChart".  Sign up information is provided on this After Visit Summary.  MyChart is used to connect with patients for Virtual Visits (Telemedicine).  Patients are able to view lab/test results, encounter notes, upcoming appointments, etc.  Non-urgent messages can be sent to your provider as well.   To learn more about what you can do with MyChart, go to [email or web address].    Your next appointment:   As Needed   Provider:   Dorris Carnes, MD     Other Instructions

## 2023-02-13 ENCOUNTER — Encounter: Payer: Self-pay | Admitting: Internal Medicine

## 2023-02-13 ENCOUNTER — Ambulatory Visit (INDEPENDENT_AMBULATORY_CARE_PROVIDER_SITE_OTHER): Payer: Medicare Other | Admitting: Internal Medicine

## 2023-02-13 VITALS — BP 136/62 | HR 89 | Temp 98.0°F | Ht 63.5 in | Wt 170.0 lb

## 2023-02-13 DIAGNOSIS — D599 Acquired hemolytic anemia, unspecified: Secondary | ICD-10-CM

## 2023-02-13 DIAGNOSIS — N1832 Chronic kidney disease, stage 3b: Secondary | ICD-10-CM

## 2023-02-13 DIAGNOSIS — I10 Essential (primary) hypertension: Secondary | ICD-10-CM | POA: Diagnosis not present

## 2023-02-13 DIAGNOSIS — F5104 Psychophysiologic insomnia: Secondary | ICD-10-CM | POA: Insufficient documentation

## 2023-02-13 LAB — CBC WITH DIFFERENTIAL/PLATELET
Basophils Absolute: 0.1 10*3/uL (ref 0.0–0.1)
Basophils Relative: 0.8 % (ref 0.0–3.0)
Eosinophils Absolute: 0.2 10*3/uL (ref 0.0–0.7)
Eosinophils Relative: 2.5 % (ref 0.0–5.0)
HCT: 28.4 % — ABNORMAL LOW (ref 36.0–46.0)
Hemoglobin: 9.8 g/dL — ABNORMAL LOW (ref 12.0–15.0)
Lymphocytes Relative: 16 % (ref 12.0–46.0)
Lymphs Abs: 1.3 10*3/uL (ref 0.7–4.0)
MCHC: 34.6 g/dL (ref 30.0–36.0)
MCV: 88.8 fl (ref 78.0–100.0)
Monocytes Absolute: 0.6 10*3/uL (ref 0.1–1.0)
Monocytes Relative: 7.8 % (ref 3.0–12.0)
Neutro Abs: 6 10*3/uL (ref 1.4–7.7)
Neutrophils Relative %: 72.9 % (ref 43.0–77.0)
Platelets: 197 10*3/uL (ref 150.0–400.0)
RBC: 3.2 Mil/uL — ABNORMAL LOW (ref 3.87–5.11)
RDW: 20.9 % — ABNORMAL HIGH (ref 11.5–15.5)
WBC: 8.2 10*3/uL (ref 4.0–10.5)

## 2023-02-13 LAB — BASIC METABOLIC PANEL
BUN: 29 mg/dL — ABNORMAL HIGH (ref 6–23)
CO2: 26 mEq/L (ref 19–32)
Calcium: 9.2 mg/dL (ref 8.4–10.5)
Chloride: 108 mEq/L (ref 96–112)
Creatinine, Ser: 1.28 mg/dL — ABNORMAL HIGH (ref 0.40–1.20)
GFR: 37.3 mL/min — ABNORMAL LOW (ref >60.00)
Glucose, Bld: 127 mg/dL — ABNORMAL HIGH (ref 70–99)
Potassium: 4.9 mEq/L (ref 3.5–5.1)
Sodium: 141 mEq/L (ref 135–145)

## 2023-02-13 LAB — TSH: TSH: 0.9 u[IU]/mL (ref 0.35–5.50)

## 2023-02-13 MED ORDER — TRAZODONE HCL 50 MG PO TABS: 50.0000 mg | ORAL_TABLET | Freq: Every evening | ORAL | 1 refills | Status: DC | PRN

## 2023-02-13 NOTE — Patient Instructions (Signed)

## 2023-02-13 NOTE — Progress Notes (Signed)
Subjective:  Patient ID: Claudia Lara, female    DOB: 31-Oct-1932  Age: 89 y.o. MRN: [id number]  CC: Anemia and Hypertension   HPI MARQUIDA Lara presents for f/up --  She has unchanged fatigue, DOE, SOB.  Outpatient Medications Prior to Visit  Medication Sig Dispense Refill   Calcium Carb-Cholecalciferol (CALCIUM CARBONATE-VITAMIN D3 PO) Take 1 tablet by mouth daily.      carvedilol (COREG) 12.5 MG tablet TAKE 1 TABLET BY MOUTH TWICE A DAY WITH FOOD 180 tablet 0   cholecalciferol (VITAMIN D) 1000 units tablet Take 1,000 Units by mouth daily.     ferrous sulfate 325 (65 FE) MG tablet Take 325 mg by mouth daily with breakfast.     methenamine (HIPREX) 1 g tablet Take 1 tablet (1 g total) by mouth 2 (two) times daily with a meal. 180 tablet 1   Multiple Vitamin (MULTIVITAMIN WITH MINERALS) TABS tablet Take 1 tablet by mouth daily.     Multiple Vitamins-Minerals (ZINC PO) Take by mouth.     torsemide (DEMADEX) 20 MG tablet Take 1 tablet by mouth daily, may take 1 extra tablet by mouth daily only as needed for increased edema or worsening shortness of breath 120 tablet 3   vitamin C (ASCORBIC ACID) 500 MG tablet Take 500 mg by mouth daily.     No facility-administered medications prior to visit.    ROS Review of Systems  Constitutional:  Positive for fatigue. Negative for appetite change, chills, diaphoresis, fever and unexpected weight change.  HENT: Negative.    Eyes: Negative.   Respiratory:  Negative for cough, chest tightness and shortness of breath.   Cardiovascular:  Negative for chest pain, palpitations and leg swelling.  Gastrointestinal:  Negative for abdominal pain, diarrhea, nausea and vomiting.  Endocrine: Negative.   Genitourinary: Negative.  Negative for difficulty urinating.  Musculoskeletal: Negative.   Skin: Negative.   Neurological:  Negative for dizziness, weakness and light-headedness.  Hematological:  Negative for adenopathy. Does not bruise/bleed easily.   Psychiatric/Behavioral:  Positive for sleep disturbance. The patient is not nervous/anxious.     Objective:  BP 136/62 (BP Location: Right Arm, Patient Position: Sitting, Cuff Size: Large)   Pulse 89   Temp 98 F (36.7 C) (Oral)   Ht 5' 3.5" (1.613 m)   Wt 170 lb (77.1 kg)   SpO2 96%   BMI 29.64 kg/m   BP Readings from Last 3 Encounters:  02/13/23 136/62  02/12/23 136/60  01/31/23 (!) 132/58    Wt Readings from Last 3 Encounters:  02/13/23 170 lb (77.1 kg)  02/12/23 168 lb (76.2 kg)  01/31/23 169 lb 3.2 oz (76.7 kg)    Physical Exam Vitals reviewed.  Constitutional:      Appearance: Normal appearance.  HENT:     Nose: Nose normal.     Mouth/Throat:     Mouth: Mucous membranes are moist.  Eyes:     General: No scleral icterus.    Conjunctiva/sclera: Conjunctivae normal.  Cardiovascular:     Rate and Rhythm: Normal rate and regular rhythm.     Pulses: Normal pulses.     Heart sounds: No murmur heard.    No gallop.  Pulmonary:     Effort: Pulmonary effort is normal.     Breath sounds: No stridor. No wheezing, rhonchi or rales.  Abdominal:     General: Abdomen is flat.     Palpations: There is no mass.     Tenderness: There is no  abdominal tenderness. There is no guarding.     Hernia: No hernia is present.  Musculoskeletal:     Cervical back: Neck supple.  Skin:    General: Skin is warm and dry.     Findings: No lesion.  Neurological:     General: No focal deficit present.     Mental Status: She is alert. Mental status is at baseline.  Psychiatric:        Mood and Affect: Mood normal.        Behavior: Behavior normal.     Lab Results  Component Value Date   WBC 8.2 02/13/2023   HGB 9.8 (L) 02/13/2023   HCT 28.4 (L) 02/13/2023   PLT 197.0 02/13/2023   GLUCOSE 127 (H) 02/13/2023   CHOL 132 11/12/2022   TRIG 120.0 11/12/2022   HDL 53.90 11/12/2022   LDLDIRECT 56.0 07/08/2018   LDLCALC 54 11/12/2022   ALT 12 02/14/2022   AST 19 02/14/2022   NA  141 02/13/2023   K 4.9 02/13/2023   CL 108 02/13/2023   CREATININE 1.28 (H) 02/13/2023   BUN 29 (H) 02/13/2023   CO2 26 02/13/2023   TSH 0.90 02/13/2023   INR 1.1 (H) 05/17/2020   HGBA1C 4.8 06/08/2019    NM PET Image Initial (PI) Skull Base To Thigh  Result Date: 02/01/2023 CLINICAL DATA:  Initial treatment strategy for lung nodule. EXAM: NUCLEAR MEDICINE PET SKULL BASE TO THIGH TECHNIQUE: 8.39 mCi F-18 FDG was injected intravenously. Full-ring PET imaging was performed from the skull base to thigh after the radiotracer. CT data was obtained and used for attenuation correction and anatomic localization. Fasting blood glucose: 113 mg/dl COMPARISON:  CT chest 01/13/2023 FINDINGS: Mediastinal blood pool activity: SUV max 2.70 Liver activity: SUV max NA NECK: No hypermetabolic lymph nodes in the neck. Incidental CT findings: None. CHEST: No tracer avid supraclavicular, axillary, mediastinal, or hilar lymph nodes. Central right upper lobe lung nodule measures 1.5 cm and has an SUV max of 3.08, image 31/7. On the CT from 07/02/2019 this measured 0.9 cm within SUV max of 0.89. Incidental CT findings: Aortic atherosclerosis with coronary artery calcifications. ABDOMEN/PELVIS: No abnormal hypermetabolic activity within the liver, pancreas, adrenal glands, or spleen. No hypermetabolic lymph nodes in the abdomen or pelvis. Incidental CT findings: Aortic atherosclerosis. Cholecystectomy. Pneumobilia is identified compatible with prior sphincterotomy and biliary patency. Sigmoid diverticulosis without acute diverticulitis. SKELETON: No focal hypermetabolic activity to suggest skeletal metastasis. Incidental CT findings: None. IMPRESSION: 1. There has interval increase in size and FDG uptake associated with the right upper lobe lung nodule when compared with 01/13/2023. This is suspicious for primary bronchogenic carcinoma. 2. No signs of tracer avid nodal metastasis or distant metastatic disease. 3. Coronary artery  calcifications. 4. Status post cholecystectomy with pneumobilia compatible with prior sphincterotomy and biliary patency. 5.  Aortic Atherosclerosis (ICD10-I70.0). Electronically Signed   By: Kerby Moors M.D.   On: 02/01/2023 11:53    Assessment & Plan:   Essential hypertension- Her BP is well controlled. -     Basic metabolic panel; Future -     TSH; Future  Acquired hemolytic anemia ([place])- H/H are stable. -     CBC with Differential/Platelet; Future -     Lactate dehydrogenase; Future -     Haptoglobin; Future  Stage 3b chronic kidney disease ([place])- Her renal function is stable. -     Basic metabolic panel; Future  Psychophysiological insomnia -     traZODone HCl; Take 1 tablet (  50 mg total) by mouth at bedtime as needed for sleep.  Dispense: 90 tablet; Refill: 1     Follow-up: Return in about 4 months (around 06/15/2023).  [name], MD

## 2023-02-14 ENCOUNTER — Encounter: Payer: Self-pay | Admitting: Internal Medicine

## 2023-02-14 LAB — HAPTOGLOBIN: Haptoglobin: 9 mg/dL — ABNORMAL LOW (ref 43–212)

## 2023-02-14 LAB — LACTATE DEHYDROGENASE: LDH: 188 U/L (ref 120–250)

## 2023-02-25 DIAGNOSIS — Z961 Presence of intraocular lens: Secondary | ICD-10-CM | POA: Diagnosis not present

## 2023-03-07 ENCOUNTER — Other Ambulatory Visit: Payer: Self-pay | Admitting: Internal Medicine

## 2023-03-07 DIAGNOSIS — N390 Urinary tract infection, site not specified: Secondary | ICD-10-CM

## 2023-03-07 DIAGNOSIS — I10 Essential (primary) hypertension: Secondary | ICD-10-CM

## 2023-03-07 DIAGNOSIS — I5189 Other ill-defined heart diseases: Secondary | ICD-10-CM

## 2023-04-04 ENCOUNTER — Ambulatory Visit (INDEPENDENT_AMBULATORY_CARE_PROVIDER_SITE_OTHER): Payer: Medicare Other | Admitting: Family Medicine

## 2023-04-04 ENCOUNTER — Encounter: Payer: Self-pay | Admitting: Family Medicine

## 2023-04-04 VITALS — BP 128/50 | HR 82 | Temp 97.3°F | Ht 63.5 in | Wt 166.0 lb

## 2023-04-04 DIAGNOSIS — R350 Frequency of micturition: Secondary | ICD-10-CM

## 2023-04-04 DIAGNOSIS — N3000 Acute cystitis without hematuria: Secondary | ICD-10-CM | POA: Diagnosis not present

## 2023-04-04 DIAGNOSIS — R82998 Other abnormal findings in urine: Secondary | ICD-10-CM

## 2023-04-04 DIAGNOSIS — R300 Dysuria: Secondary | ICD-10-CM | POA: Diagnosis not present

## 2023-04-04 LAB — POC URINALSYSI DIPSTICK (AUTOMATED)
Bilirubin, UA: NEGATIVE
Blood, UA: NEGATIVE
Glucose, UA: NEGATIVE
Ketones, UA: NEGATIVE
Nitrite, UA: NEGATIVE
Protein, UA: POSITIVE — AB
Spec Grav, UA: >=1.03 — AB (ref 1.010–1.025)
Urobilinogen, UA: 0.2 E.U./dL
pH, UA: 6 (ref 5.0–8.0)

## 2023-04-04 MED ORDER — AMOXICILLIN-POT CLAVULANATE 500-125 MG PO TABS: 1.0000 | ORAL_TABLET | Freq: Two times a day (BID) | ORAL | 0 refills | Status: DC

## 2023-04-04 NOTE — Patient Instructions (Signed)
Take the antibiotic as prescribed with food.  Drink plenty of water.

## 2023-04-04 NOTE — Progress Notes (Signed)
Subjective:  Claudia Lara is a 89 y.o. female who complains of possible urinary tract infection.  She has had symptoms for several days.  Symptoms include  urinary frequency and dysuria  . Patient denies fever, chills, abdominal pain, back pain, N/V/D, constipation.   She has stopped taking Jardiance, not sure if she told her PCP.   Patient does have a history of recurrent UTI.     Past Medical History:  Diagnosis Date   ANEMIA 08/22/2009   Aortic stenosis    AR (aortic regurgitation) 01/2019   Mild to Moderate, Noted on ECHO   CAROTID ARTERY STENOSIS 01/16/2010   Complication of anesthesia    very hard to wake up from surgery   DYSPNEA ON EXERTION 11/16/2007   Gallstones    GEN OSTEOARTHROSIS INVOLVING MULTIPLE SITES 11/11/2008   GERD (gastroesophageal reflux disease)    Grade I diastolic dysfunction 01/2018   Noted on ECHO   Heart murmur    HEMORRHOIDS, INTERNAL    surgery was in the 90's (per patient)   History of blood transfusion    after hip and knee replacements   NECK PAIN, ACUTE 12/28/2009   PERIPHERAL EDEMA 10/06/2007   PONV (postoperative nausea and vomiting)    Pulmonary nodule 06/18/2019   noted on CT Chest    PVD 10/06/2007   Unspecified essential hypertension 10/19/2007    ROS as in subjective  Reviewed allergies, medications, past medical, surgical, and social history.    Objective: Vitals:   04/04/23 1334  BP: (!) 128/50  Pulse: 82  Temp: (!) 97.3 F (36.3 C)  SpO2: 100%    General appearance: alert, no distress, WD/WN, female Abdomen: +bs, soft, non tender, non distended Back: no CVA tenderness     Laboratory:  Urine dipstick: sp gravity 1.030 and 1+ for leukocyte esterase.       Assessment: Acute cystitis without hematuria - Plan: amoxicillin-clavulanate (AUGMENTIN) 500-125 MG tablet  Urinary frequency - Plan: POCT Urinalysis Dipstick (Automated), Urine Culture, Urine Culture  Dysuria - Plan: POCT Urinalysis Dipstick (Automated),  Urine Culture, Urine Culture  Leukocytes in urine - Plan: Urine Culture, Urine Culture   Plan: Discussed symptoms, diagnosis, possible complications, and usual course of illness.  Augmentin prescribed. She took cephalexin in February.  Advised increased water intake, can use OTC Tylenol for pain.      Urine culture sent.  Call or return if worse or not improving.

## 2023-04-05 LAB — URINE CULTURE: Result:: NO GROWTH

## 2023-06-13 DIAGNOSIS — X32XXXD Exposure to sunlight, subsequent encounter: Secondary | ICD-10-CM | POA: Diagnosis not present

## 2023-06-13 DIAGNOSIS — L570 Actinic keratosis: Secondary | ICD-10-CM | POA: Diagnosis not present

## 2023-06-13 DIAGNOSIS — C44622 Squamous cell carcinoma of skin of right upper limb, including shoulder: Secondary | ICD-10-CM | POA: Diagnosis not present

## 2023-06-25 DIAGNOSIS — C44622 Squamous cell carcinoma of skin of right upper limb, including shoulder: Secondary | ICD-10-CM | POA: Diagnosis not present

## 2023-07-08 ENCOUNTER — Ambulatory Visit (INDEPENDENT_AMBULATORY_CARE_PROVIDER_SITE_OTHER): Payer: Medicare Other | Admitting: Emergency Medicine

## 2023-07-08 ENCOUNTER — Ambulatory Visit (INDEPENDENT_AMBULATORY_CARE_PROVIDER_SITE_OTHER): Payer: Medicare Other

## 2023-07-08 ENCOUNTER — Encounter: Payer: Self-pay | Admitting: Emergency Medicine

## 2023-07-08 VITALS — BP 152/82 | HR 78 | Temp 97.9°F | Ht 63.5 in | Wt 164.0 lb

## 2023-07-08 DIAGNOSIS — I10 Essential (primary) hypertension: Secondary | ICD-10-CM | POA: Diagnosis not present

## 2023-07-08 DIAGNOSIS — I352 Nonrheumatic aortic (valve) stenosis with insufficiency: Secondary | ICD-10-CM | POA: Diagnosis not present

## 2023-07-08 DIAGNOSIS — R0989 Other specified symptoms and signs involving the circulatory and respiratory systems: Secondary | ICD-10-CM | POA: Diagnosis not present

## 2023-07-08 DIAGNOSIS — I517 Cardiomegaly: Secondary | ICD-10-CM | POA: Diagnosis not present

## 2023-07-08 MED ORDER — LOSARTAN POTASSIUM 25 MG PO TABS: 25.0000 mg | ORAL_TABLET | Freq: Every day | ORAL | 3 refills | Status: DC

## 2023-07-08 NOTE — Patient Instructions (Signed)
Hypertension, Adult High blood pressure (hypertension) is when the force of blood pumping through the arteries is too strong. The arteries are the blood vessels that carry blood from the heart throughout the body. Hypertension forces the heart to work harder to pump blood and may cause arteries to become narrow or stiff. Untreated or uncontrolled hypertension can lead to a heart attack, heart failure, a stroke, kidney disease, and other problems. A blood pressure reading consists of a higher number over a lower number. Ideally, your blood pressure should be below 120/80. The first ("top") number is called the systolic pressure. It is a measure of the pressure in your arteries as your heart beats. The second ("bottom") number is called the diastolic pressure. It is a measure of the pressure in your arteries as the heart relaxes. What are the causes? The exact cause of this condition is not known. There are some conditions that result in high blood pressure. What increases the risk? Certain factors may make you more likely to develop high blood pressure. Some of these risk factors are under your control, including: Smoking. Not getting enough exercise or physical activity. Being overweight. Having too much fat, sugar, calories, or salt (sodium) in your diet. Drinking too much alcohol. Other risk factors include: Having a personal history of heart disease, diabetes, high cholesterol, or kidney disease. Stress. Having a family history of high blood pressure and high cholesterol. Having obstructive sleep apnea. Age. The risk increases with age. What are the signs or symptoms? High blood pressure may not cause symptoms. Very high blood pressure (hypertensive crisis) may cause: Headache. Fast or irregular heartbeats (palpitations). Shortness of breath. Nosebleed. Nausea and vomiting. Vision changes. Severe chest pain, dizziness, and seizures. How is this diagnosed? This condition is diagnosed by  measuring your blood pressure while you are seated, with your arm resting on a flat surface, your legs uncrossed, and your feet flat on the floor. The cuff of the blood pressure monitor will be placed directly against the skin of your upper arm at the level of your heart. Blood pressure should be measured at least twice using the same arm. Certain conditions can cause a difference in blood pressure between your right and left arms. If you have a high blood pressure reading during one visit or you have normal blood pressure with other risk factors, you may be asked to: Return on a different day to have your blood pressure checked again. Monitor your blood pressure at home for 1 week or longer. If you are diagnosed with hypertension, you may have other blood or imaging tests to help your health care provider understand your overall risk for other conditions. How is this treated? This condition is treated by making healthy lifestyle changes, such as eating healthy foods, exercising more, and reducing your alcohol intake. You may be referred for counseling on a healthy diet and physical activity. Your health care provider may prescribe medicine if lifestyle changes are not enough to get your blood pressure under control and if: Your systolic blood pressure is above 130. Your diastolic blood pressure is above 80. Your personal target blood pressure may vary depending on your medical conditions, your age, and other factors. Follow these instructions at home: Eating and drinking  Eat a diet that is high in fiber and potassium, and low in sodium, added sugar, and fat. An example of this eating plan is called the DASH diet. DASH stands for Dietary Approaches to Stop Hypertension. To eat this way: Eat   plenty of fresh fruits and vegetables. Try to fill one half of your plate at each meal with fruits and vegetables. Eat whole grains, such as whole-wheat pasta, brown rice, or whole-grain bread. Fill about one  fourth of your plate with whole grains. Eat or drink low-fat dairy products, such as skim milk or low-fat yogurt. Avoid fatty cuts of meat, processed or cured meats, and poultry with skin. Fill about one fourth of your plate with lean proteins, such as fish, chicken without skin, beans, eggs, or tofu. Avoid pre-made and processed foods. These tend to be higher in sodium, added sugar, and fat. Reduce your daily sodium intake. Many people with hypertension should eat less than 1,500 mg of sodium a day. Do not drink alcohol if: Your health care provider tells you not to drink. You are pregnant, may be pregnant, or are planning to become pregnant. If you drink alcohol: Limit how much you have to: 0-1 drink a day for women. 0-2 drinks a day for men. Know how much alcohol is in your drink. In the [place] one drink equals one 12 oz bottle of beer (355 mL), one 5 oz glass of wine (148 mL), or one 1 oz glass of hard liquor (44 mL). Lifestyle  Work with your health care provider to maintain a healthy body weight or to lose weight. Ask what an ideal weight is for you. Get at least 30 minutes of exercise that causes your heart to beat faster (aerobic exercise) most days of the week. Activities may include walking, swimming, or biking. Include exercise to strengthen your muscles (resistance exercise), such as Pilates or lifting weights, as part of your weekly exercise routine. Try to do these types of exercises for 30 minutes at least 3 days a week. Do not use any products that contain nicotine or tobacco. These products include cigarettes, chewing tobacco, and vaping devices, such as e-cigarettes. If you need help quitting, ask your health care provider. Monitor your blood pressure at home as told by your health care provider. Keep all follow-up visits. This is important. Medicines Take over-the-counter and prescription medicines only as told by your health care provider. Follow directions carefully. Blood  pressure medicines must be taken as prescribed. Do not skip doses of blood pressure medicine. Doing this puts you at risk for problems and can make the medicine less effective. Ask your health care provider about side effects or reactions to medicines that you should watch for. Contact a health care provider if you: Think you are having a reaction to a medicine you are taking. Have headaches that keep coming back (recurring). Feel dizzy. Have swelling in your ankles. Have trouble with your vision. Get help right away if you: Develop a severe headache or confusion. Have unusual weakness or numbness. Feel faint. Have severe pain in your chest or abdomen. Vomit repeatedly. Have trouble breathing. These symptoms may be an emergency. Get help right away. Call 911. Do not wait to see if the symptoms will go away. Do not drive yourself to the hospital. Summary Hypertension is when the force of blood pumping through your arteries is too strong. If this condition is not controlled, it may put you at risk for serious complications. Your personal target blood pressure may vary depending on your medical conditions, your age, and other factors. For most people, a normal blood pressure is less than 120/80. Hypertension is treated with lifestyle changes, medicines, or a combination of both. Lifestyle changes include losing weight, eating a healthy,   low-sodium diet, exercising more, and limiting alcohol. This information is not intended to replace advice given to you by your health care provider. Make sure you discuss any questions you have with your health care provider. Document Revised: 09/18/2021 Document Reviewed: 09/18/2021 Elsevier Patient Education  2024 [employer]  

## 2023-07-08 NOTE — Assessment & Plan Note (Addendum)
History of essential hypertension. Elevated blood pressure readings in the office and at home for the past couple of weeks. Recommend to continue carvedilol 12.5 mg twice a day and start losartan 25 mg daily Cardiovascular risks associated with uncontrolled hypertension discussed Advised to continue monitoring blood pressure readings at home daily for the next several weeks and keep a log.  Advised to contact the office if numbers persistently abnormal.

## 2023-07-08 NOTE — Progress Notes (Signed)
Claudia Lara 89 y.o.   Chief Complaint  Patient presents with   Hypertension    BP concerns, Patient has been having some episodes of elevated BP,     HISTORY OF PRESENT ILLNESS: Acute problem visit today.  Patient of Dr. Sanda Linger. This is a 89 y.o. female concerned about elevated blood pressure readings at home for the last couple of weeks.  Average 150s over 90s. Asymptomatic.  Presently on carvedilol 12.5 mg twice a day and torsemide 20 mg as needed.  No other blood pressure medication Recently had skin biopsy on the right forearm showing cancer.  Dermatologist requesting chest x-ray.  Hypertension Pertinent negatives include no chest pain, headaches, palpitations or shortness of breath.     Prior to Admission medications   Medication Sig Start Date End Date Taking? Authorizing Provider  Calcium Carb-Cholecalciferol (CALCIUM CARBONATE-VITAMIN D3 PO) Take 1 tablet by mouth daily.    Yes [provider]  carvedilol (COREG) 12.5 MG tablet TAKE 1 TABLET BY MOUTH TWICE A DAY WITH FOOD 03/07/23  Yes Etta Grandchild, MD  cholecalciferol (VITAMIN D) 1000 units tablet Take 1,000 Units by mouth daily.   Yes [provider]  ferrous sulfate 325 (65 FE) MG tablet Take 325 mg by mouth daily with breakfast. 01/11/21  Yes [provider]  methenamine (HIPREX) 1 g tablet TAKE 1 TABLET (1 G TOTAL) BY MOUTH 2 (TWO) TIMES DAILY WITH A MEAL. 03/07/23  Yes Etta Grandchild, MD  Multiple Vitamin (MULTIVITAMIN WITH MINERALS) TABS tablet Take 1 tablet by mouth daily.   Yes [provider]  Multiple Vitamins-Minerals (ZINC PO) Take by mouth.   Yes [provider]  torsemide (DEMADEX) 20 MG tablet TAKE 1 TABLET BY MOUTH EVERY DAY 03/07/23  Yes Etta Grandchild, MD  traZODone (DESYREL) 50 MG tablet Take 1 tablet (50 mg total) by mouth at bedtime as needed for sleep. 02/13/23  Yes Etta Grandchild, MD  vitamin C (ASCORBIC ACID) 500 MG tablet Take 500 mg by mouth  daily.   Yes [provider]    Allergies  Allergen Reactions   Sulfonamide Derivatives Rash    Childhood reaction    Patient Active Problem List   Diagnosis Date Noted   Psychophysiological insomnia 02/13/2023   Encounter for general adult medical examination with abnormal findings 07/22/2021   Recurrent UTI (urinary tract infection) 07/13/2021   Estrogen deficiency 01/22/2021   OAB (overactive bladder) 11/01/2020   Carpal tunnel syndrome, left upper limb 04/05/2020   Iron deficiency anemia due to chronic blood loss 09/08/2019   Interstitial pulmonary disease (HCC) 09/08/2019   OA (osteoarthritis) of knee 07/26/2019   Lung nodule 06/24/2019   DDD (degenerative disc disease), cervical 04/16/2019   Zinc deficiency 10/03/2017   Spondylosis, lumbar, with myelopathy 04/09/2017   GERD (gastroesophageal reflux disease) 08/03/2015   Hemolytic anemia (HCC) 07/12/2015   Chronic venous insufficiency 08/10/2014   Aortic stenosis, mild 06/29/2014   Left ventricular diastolic dysfunction, NYHA class 1 06/29/2014   Senile osteopenia 04/21/2014   Hyperlipidemia with target LDL less than 130 04/21/2014   Kidney disease, chronic, stage III (GFR 30-59 ml/min) (HCC) 04/21/2014   Carotid artery stenosis without cerebral infarction 01/16/2010   Essential hypertension 10/19/2007    Past Medical History:  Diagnosis Date   ANEMIA 08/22/2009   Aortic stenosis    AR (aortic regurgitation) 01/2019   Mild to Moderate, Noted on ECHO   CAROTID ARTERY STENOSIS 01/16/2010   Complication of anesthesia  very hard to wake up from surgery   DYSPNEA ON EXERTION 11/16/2007   Gallstones    GEN OSTEOARTHROSIS INVOLVING MULTIPLE SITES 11/11/2008   GERD (gastroesophageal reflux disease)    Grade I diastolic dysfunction 01/2018   Noted on ECHO   Heart murmur    HEMORRHOIDS, INTERNAL    surgery was in the 90's (per patient)   History of blood transfusion    after hip and knee replacements   NECK  PAIN, ACUTE 12/28/2009   PERIPHERAL EDEMA 10/06/2007   PONV (postoperative nausea and vomiting)    Pulmonary nodule 06/18/2019   noted on CT Chest    PVD 10/06/2007   Unspecified essential hypertension 10/19/2007    Past Surgical History:  Procedure Laterality Date   ABDOMINAL HYSTERECTOMY     BIOPSY  08/06/2019   Procedure: BIOPSY;  Surgeon: Iva Boop, MD;  Location: [facility];  Service: Gastroenterology;;   Arbutus Leas     bilat   CARPAL TUNNEL RELEASE     CHOLECYSTECTOMY  01/29/2016   at [facility]   CHOLECYSTECTOMY     ENDOSCOPIC RETROGRADE CHOLANGIOPANCREATOGRAPHY (ERCP) WITH PROPOFOL N/A 12/01/2015   Procedure: ENDOSCOPIC RETROGRADE CHOLANGIOPANCREATOGRAPHY (ERCP) WITH PROPOFOL;  Surgeon: Rachael Fee, MD;  Location: [facility];  Service: Endoscopy;  Laterality: N/A;   ENDOSCOPIC RETROGRADE CHOLANGIOPANCREATOGRAPHY (ERCP) WITH PROPOFOL N/A 02/01/2016   Procedure: ENDOSCOPIC RETROGRADE CHOLANGIOPANCREATOGRAPHY (ERCP) WITH PROPOFOL;  Surgeon: Rachael Fee, MD;  Location: [facility];  Service: Endoscopy;  Laterality: N/A;   ENDOSCOPIC RETROGRADE CHOLANGIOPANCREATOGRAPHY (ERCP) WITH PROPOFOL N/A 04/07/2018   Procedure: ENDOSCOPIC RETROGRADE CHOLANGIOPANCREATOGRAPHY (ERCP) WITH PROPOFOL;  Surgeon: Rachael Fee, MD;  Location: [facility];  Service: Endoscopy;  Laterality: N/A;   ESOPHAGOGASTRODUODENOSCOPY (EGD) WITH PROPOFOL N/A 08/06/2019   Procedure: ESOPHAGOGASTRODUODENOSCOPY (EGD) WITH PROPOFOL;  Surgeon: Iva Boop, MD;  Location: [facility];  Service: Gastroenterology;  Laterality: N/A;   HAMMER TOE SURGERY     HEMORRHOID SURGERY     Hyperplastic colon polyps, removed  2007   By Dr. Evette Cristal   JOINT REPLACEMENT  2011   right knee   OOPHORECTOMY     REMOVAL OF STONES  04/07/2018   Procedure: REMOVAL OF STONES;  Surgeon: Rachael Fee, MD;  Location: [facility];  Service: Endoscopy;;   ROTATOR CUFF REPAIR  2004   left (Dr. Cleophas Dunker)    SPHINCTEROTOMY  04/07/2018   Procedure: Dennison Mascot;  Surgeon: Rachael Fee, MD;  Location: [facility];  Service: Endoscopy;;   TOTAL HIP ARTHROPLASTY Right 11/26/2016   Procedure: RIGHT TOTAL HIP ARTHROPLASTY;  Surgeon: Valeria Batman, MD;  Location: [facility];  Service: Orthopedics;  Laterality: Right;   TOTAL KNEE ARTHROPLASTY Right    TOTAL KNEE ARTHROPLASTY Left 07/26/2019   Procedure: TOTAL KNEE ARTHROPLASTY;  Surgeon: Ollen Gross, MD;  Location: [facility];  Service: Orthopedics;  Laterality: Left;     Social History   Socioeconomic History   Marital status: Divorced    Spouse name: Not on file   Number of children: 1   Years of education: 12   Highest education level: Not on file  Occupational History   Occupation: Producer, television/film/video: RETIRED    Comment: retired  Tobacco Use   Smoking status: Never   Smokeless tobacco: Never  Vaping Use   Vaping status: Never Used  Substance and Sexual Activity   Alcohol use: No   Drug use: No   Sexual activity: Not Currently    Birth control/protection:  Surgical  Other Topics Concern   Not on file  Social History Narrative   HSG. Married - divorced for many years.  Retired. Lives alone - very active and independent.   Social Determinants of Health   Financial Resource Strain: Low Risk  (09/26/2022)   Overall Financial Resource Strain (CARDIA)    Difficulty of Paying Living Expenses: Not hard at all  Food Insecurity: No Food Insecurity (09/26/2022)   Hunger Vital Sign    Worried About Running Out of Food in the Last Year: Never true    Ran Out of Food in the Last Year: Never true  Transportation Needs: No Transportation Needs (09/26/2022)   PRAPARE - Administrator, Civil Service (Medical): No    Lack of Transportation (Non-Medical): No  Physical Activity: Sufficiently Active (09/26/2022)   Exercise Vital Sign    Days of Exercise per Week: 5 days    Minutes of Exercise per Session: 30 min  Stress: No  Stress Concern Present (09/26/2022)   [employer] of Occupational Health - Occupational Stress Questionnaire    Feeling of Stress : Not at all  Social Connections: Moderately Integrated (09/26/2022)   Social Connection and Isolation Panel [NHANES]    Frequency of Communication with Friends and Family: More than three times a week    Frequency of Social Gatherings with Friends and Family: More than three times a week    Attends Religious Services: More than 4 times per year    Active Member of [employer] or Organizations: Yes    Attends Engineer, structural: More than 4 times per year    Marital Status: Divorced  Intimate Partner Violence: Not At Risk (09/26/2022)   Humiliation, Afraid, Rape, and Kick questionnaire    Fear of Current or Ex-Partner: No    Emotionally Abused: No    Physically Abused: No    Sexually Abused: No    Family History  Problem Relation Age of Onset   Ovarian cancer Mother    Cancer Mother    Leukemia Father    Lung cancer Father    Cancer Father    Cancer Brother    Diabetes Neg Hx    Heart disease Neg Hx    Hypertension Neg Hx      Review of Systems  Constitutional: Negative.  Negative for chills and fever.  HENT: Negative.  Negative for congestion and sore throat.   Respiratory: Negative.  Negative for cough and shortness of breath.   Cardiovascular: Negative.  Negative for chest pain and palpitations.  Gastrointestinal:  Negative for abdominal pain, diarrhea, nausea and vomiting.  Genitourinary: Negative.  Negative for dysuria and hematuria.  Skin: Negative.  Negative for rash.  Neurological: Negative.  Negative for dizziness and headaches.  All other systems reviewed and are negative.   Vitals:   07/08/23 1455  BP: (!) 152/82  Pulse: 78  Temp: 97.9 F (36.6 C)  SpO2: 98%    Physical Exam Vitals reviewed.  Constitutional:      Appearance: Normal appearance.  HENT:     Head: Normocephalic.  Eyes:     Extraocular Movements:  Extraocular movements intact.     Pupils: Pupils are equal, round, and reactive to light.  Cardiovascular:     Rate and Rhythm: Normal rate and regular rhythm.     Heart sounds: Murmur (Systolic 3/6 mostly aortic area) heard.  Pulmonary:     Effort: Pulmonary effort is normal.     Breath sounds: Normal breath  sounds.  Musculoskeletal:     Cervical back: No tenderness.     Right lower leg: Edema present.     Left lower leg: Edema present.  Lymphadenopathy:     Cervical: No cervical adenopathy.  Skin:    Capillary Refill: Capillary refill takes less than 2 seconds.  Neurological:     Mental Status: She is alert and oriented to person, place, and time.  Psychiatric:        Mood and Affect: Mood normal.        Behavior: Behavior normal.    DG Chest 2 View  Result Date: 07/08/2023 CLINICAL DATA:  Uncontrolled hypertension. History of aortic stenosis and aortic regurgitation. EXAM: CHEST - 2 VIEW COMPARISON:  09/01/2019 FINDINGS: Cardiac enlargement with mild vascular congestion. No edema or consolidation. No pleural effusions. No pneumothorax. Calcification of the aorta. Surgical clips in the right upper quadrant. Old rib fractures bilaterally. IMPRESSION: Cardiac enlargement with mild pulmonary vascular congestion. No edema, effusion, or consolidation. Electronically Signed   By: Burman Nieves M.D.   On: 07/08/2023 16:28     ASSESSMENT & PLAN: A total of 42 was spent with the patient and counseling/coordination of care regarding preparing for this visit, review of most recent office visit notes, review of chronic medical conditions under management, diagnosis of uncontrolled hypertension and cardiovascular risks associated with this condition, review of all medications and changes made, review of most recent blood work results, prognosis, documentation, and need for follow-up  Problem List Items Addressed This Visit       Cardiovascular and Mediastinum   Uncontrolled hypertension -  Primary    History of essential hypertension. Elevated blood pressure readings in the office and at home for the past couple of weeks. Recommend to continue carvedilol 12.5 mg twice a day and start losartan 25 mg daily Cardiovascular risks associated with uncontrolled hypertension discussed Advised to continue monitoring blood pressure readings at home daily for the next several weeks and keep a log.  Advised to contact the office if numbers persistently abnormal.      Relevant Medications   losartan (COZAAR) 25 MG tablet   Other Relevant Orders   DG Chest 2 View   Patient Instructions  Hypertension, Adult High blood pressure (hypertension) is when the force of blood pumping through the arteries is too strong. The arteries are the blood vessels that carry blood from the heart throughout the body. Hypertension forces the heart to work harder to pump blood and may cause arteries to become narrow or stiff. Untreated or uncontrolled hypertension can lead to a heart attack, heart failure, a stroke, kidney disease, and other problems. A blood pressure reading consists of a higher number over a lower number. Ideally, your blood pressure should be below 120/80. The first ("top") number is called the systolic pressure. It is a measure of the pressure in your arteries as your heart beats. The second ("bottom") number is called the diastolic pressure. It is a measure of the pressure in your arteries as the heart relaxes. What are the causes? The exact cause of this condition is not known. There are some conditions that result in high blood pressure. What increases the risk? Certain factors may make you more likely to develop high blood pressure. Some of these risk factors are under your control, including: Smoking. Not getting enough exercise or physical activity. Being overweight. Having too much fat, sugar, calories, or salt (sodium) in your diet. Drinking too much alcohol. Other risk factors  include:  Having a personal history of heart disease, diabetes, high cholesterol, or kidney disease. Stress. Having a family history of high blood pressure and high cholesterol. Having obstructive sleep apnea. Age. The risk increases with age. What are the signs or symptoms? High blood pressure may not cause symptoms. Very high blood pressure (hypertensive crisis) may cause: Headache. Fast or irregular heartbeats (palpitations). Shortness of breath. Nosebleed. Nausea and vomiting. Vision changes. Severe chest pain, dizziness, and seizures. How is this diagnosed? This condition is diagnosed by measuring your blood pressure while you are seated, with your arm resting on a flat surface, your legs uncrossed, and your feet flat on the floor. The cuff of the blood pressure monitor will be placed directly against the skin of your upper arm at the level of your heart. Blood pressure should be measured at least twice using the same arm. Certain conditions can cause a difference in blood pressure between your right and left arms. If you have a high blood pressure reading during one visit or you have normal blood pressure with other risk factors, you may be asked to: Return on a different day to have your blood pressure checked again. Monitor your blood pressure at home for 1 week or longer. If you are diagnosed with hypertension, you may have other blood or imaging tests to help your health care provider understand your overall risk for other conditions. How is this treated? This condition is treated by making healthy lifestyle changes, such as eating healthy foods, exercising more, and reducing your alcohol intake. You may be referred for counseling on a healthy diet and physical activity. Your health care provider may prescribe medicine if lifestyle changes are not enough to get your blood pressure under control and if: Your systolic blood pressure is above 130. Your diastolic blood pressure is above  80. Your personal target blood pressure may vary depending on your medical conditions, your age, and other factors. Follow these instructions at home: Eating and drinking  Eat a diet that is high in fiber and potassium, and low in sodium, added sugar, and fat. An example of this eating plan is called the DASH diet. DASH stands for Dietary Approaches to Stop Hypertension. To eat this way: Eat plenty of fresh fruits and vegetables. Try to fill one half of your plate at each meal with fruits and vegetables. Eat whole grains, such as whole-wheat pasta, brown rice, or whole-grain bread. Fill about one fourth of your plate with whole grains. Eat or drink low-fat dairy products, such as skim milk or low-fat yogurt. Avoid fatty cuts of meat, processed or cured meats, and poultry with skin. Fill about one fourth of your plate with lean proteins, such as fish, chicken without skin, beans, eggs, or tofu. Avoid pre-made and processed foods. These tend to be higher in sodium, added sugar, and fat. Reduce your daily sodium intake. Many people with hypertension should eat less than 1,500 mg of sodium a day. Do not drink alcohol if: Your health care provider tells you not to drink. You are pregnant, may be pregnant, or are planning to become pregnant. If you drink alcohol: Limit how much you have to: 0-1 drink a day for women. 0-2 drinks a day for men. Know how much alcohol is in your drink. In the [place] one drink equals one 12 oz bottle of beer (355 mL), one 5 oz glass of wine (148 mL), or one 1 oz glass of hard liquor (44 mL). Lifestyle  Work with your health  care provider to maintain a healthy body weight or to lose weight. Ask what an ideal weight is for you. Get at least 30 minutes of exercise that causes your heart to beat faster (aerobic exercise) most days of the week. Activities may include walking, swimming, or biking. Include exercise to strengthen your muscles (resistance exercise), such as  Pilates or lifting weights, as part of your weekly exercise routine. Try to do these types of exercises for 30 minutes at least 3 days a week. Do not use any products that contain nicotine or tobacco. These products include cigarettes, chewing tobacco, and vaping devices, such as e-cigarettes. If you need help quitting, ask your health care provider. Monitor your blood pressure at home as told by your health care provider. Keep all follow-up visits. This is important. Medicines Take over-the-counter and prescription medicines only as told by your health care provider. Follow directions carefully. Blood pressure medicines must be taken as prescribed. Do not skip doses of blood pressure medicine. Doing this puts you at risk for problems and can make the medicine less effective. Ask your health care provider about side effects or reactions to medicines that you should watch for. Contact a health care provider if you: Think you are having a reaction to a medicine you are taking. Have headaches that keep coming back (recurring). Feel dizzy. Have swelling in your ankles. Have trouble with your vision. Get help right away if you: Develop a severe headache or confusion. Have unusual weakness or numbness. Feel faint. Have severe pain in your chest or abdomen. Vomit repeatedly. Have trouble breathing. These symptoms may be an emergency. Get help right away. Call 911. Do not wait to see if the symptoms will go away. Do not drive yourself to the hospital. Summary Hypertension is when the force of blood pumping through your arteries is too strong. If this condition is not controlled, it may put you at risk for serious complications. Your personal target blood pressure may vary depending on your medical conditions, your age, and other factors. For most people, a normal blood pressure is less than 120/80. Hypertension is treated with lifestyle changes, medicines, or a combination of both. Lifestyle  changes include losing weight, eating a healthy, low-sodium diet, exercising more, and limiting alcohol. This information is not intended to replace advice given to you by your health care provider. Make sure you discuss any questions you have with your health care provider. Document Revised: 09/18/2021 Document Reviewed: 09/18/2021 Elsevier Patient Education  2024 Elsevier Inc.     Edwina Barth, MD [facility]

## 2023-07-10 ENCOUNTER — Encounter (INDEPENDENT_AMBULATORY_CARE_PROVIDER_SITE_OTHER): Payer: Self-pay

## 2023-07-10 ENCOUNTER — Telehealth: Payer: Self-pay | Admitting: Internal Medicine

## 2023-07-10 NOTE — Telephone Encounter (Signed)
Patient saw Dr. Alvy Bimler on 07/08/23 and had a chest x-ray. She said she would like for it to be sent to her dermatologist's office. Her dermatologist's name is Dr. Duard Larsen and he is at [address] [place], [place], [place]. The office phone number is [phone]. Best callback for patient is [phone].

## 2023-07-18 NOTE — Telephone Encounter (Signed)
Chest x ray faxed to dermatology office as requested by patient

## 2023-07-22 NOTE — Telephone Encounter (Signed)
Pt called the Stating that the Dermatology was never received the chest xray.  Fax number [phone]

## 2023-07-25 NOTE — Telephone Encounter (Signed)
Patient called and said they never received the chest xray

## 2023-07-29 ENCOUNTER — Other Ambulatory Visit: Payer: Self-pay | Admitting: Internal Medicine

## 2023-07-29 NOTE — Telephone Encounter (Signed)
Please refax - their fax number is working now

## 2023-08-05 ENCOUNTER — Encounter: Payer: Self-pay | Admitting: Internal Medicine

## 2023-08-05 ENCOUNTER — Ambulatory Visit (INDEPENDENT_AMBULATORY_CARE_PROVIDER_SITE_OTHER): Payer: Medicare Other | Admitting: Internal Medicine

## 2023-08-05 VITALS — BP 134/60 | HR 79 | Temp 98.0°F | Ht 63.5 in | Wt 163.0 lb

## 2023-08-05 DIAGNOSIS — I5189 Other ill-defined heart diseases: Secondary | ICD-10-CM

## 2023-08-05 DIAGNOSIS — D500 Iron deficiency anemia secondary to blood loss (chronic): Secondary | ICD-10-CM

## 2023-08-05 DIAGNOSIS — R0609 Other forms of dyspnea: Secondary | ICD-10-CM | POA: Insufficient documentation

## 2023-08-05 DIAGNOSIS — D599 Acquired hemolytic anemia, unspecified: Secondary | ICD-10-CM | POA: Diagnosis not present

## 2023-08-05 DIAGNOSIS — E785 Hyperlipidemia, unspecified: Secondary | ICD-10-CM

## 2023-08-05 DIAGNOSIS — R739 Hyperglycemia, unspecified: Secondary | ICD-10-CM | POA: Insufficient documentation

## 2023-08-05 DIAGNOSIS — I10 Essential (primary) hypertension: Secondary | ICD-10-CM

## 2023-08-05 DIAGNOSIS — N1832 Chronic kidney disease, stage 3b: Secondary | ICD-10-CM | POA: Diagnosis not present

## 2023-08-05 LAB — CBC WITH DIFFERENTIAL/PLATELET
Basophils Absolute: 0.1 10*3/uL (ref 0.0–0.1)
Basophils Relative: 0.8 % (ref 0.0–3.0)
Eosinophils Absolute: 0.2 10*3/uL (ref 0.0–0.7)
Eosinophils Relative: 2.8 % (ref 0.0–5.0)
HCT: 28.1 % — ABNORMAL LOW (ref 36.0–46.0)
Hemoglobin: 9.4 g/dL — ABNORMAL LOW (ref 12.0–15.0)
Lymphocytes Relative: 17.5 % (ref 12.0–46.0)
Lymphs Abs: 1.4 10*3/uL (ref 0.7–4.0)
MCHC: 33.6 g/dL (ref 30.0–36.0)
MCV: 88.7 fl (ref 78.0–100.0)
Monocytes Absolute: 0.7 10*3/uL (ref 0.1–1.0)
Monocytes Relative: 8.9 % (ref 3.0–12.0)
Neutro Abs: 5.5 10*3/uL (ref 1.4–7.7)
Neutrophils Relative %: 70 % (ref 43.0–77.0)
Platelets: 222 10*3/uL (ref 150.0–400.0)
RBC: 3.17 Mil/uL — ABNORMAL LOW (ref 3.87–5.11)
RDW: 20.7 % — ABNORMAL HIGH (ref 11.5–15.5)
WBC: 7.8 10*3/uL (ref 4.0–10.5)

## 2023-08-05 LAB — BASIC METABOLIC PANEL
BUN: 26 mg/dL — ABNORMAL HIGH (ref 6–23)
CO2: 26 meq/L (ref 19–32)
Calcium: 9.3 mg/dL (ref 8.4–10.5)
Chloride: 109 meq/L (ref 96–112)
Creatinine, Ser: 1.11 mg/dL (ref 0.40–1.20)
GFR: 44.11 mL/min — ABNORMAL LOW (ref >60.00)
Glucose, Bld: 83 mg/dL (ref 70–99)
Potassium: 4.2 meq/L (ref 3.5–5.1)
Sodium: 142 meq/L (ref 135–145)

## 2023-08-05 LAB — BRAIN NATRIURETIC PEPTIDE: Pro B Natriuretic peptide (BNP): 482 pg/mL — ABNORMAL HIGH (ref 0.0–100.0)

## 2023-08-05 LAB — IBC + FERRITIN
Ferritin: 917.7 ng/mL — ABNORMAL HIGH (ref 10.0–291.0)
Iron: 71 ug/dL (ref 42–145)
Saturation Ratios: 32.5 % (ref 20.0–50.0)
TIBC: 218.4 ug/dL — ABNORMAL LOW (ref 250.0–450.0)
Transferrin: 156 mg/dL — ABNORMAL LOW (ref 212.0–360.0)

## 2023-08-05 LAB — HEPATIC FUNCTION PANEL
ALT: 12 U/L (ref 0–35)
AST: 19 U/L (ref 0–37)
Albumin: 4.2 g/dL (ref 3.5–5.2)
Alkaline Phosphatase: 68 U/L (ref 39–117)
Bilirubin, Direct: 0.4 mg/dL — ABNORMAL HIGH (ref 0.0–0.3)
Total Bilirubin: 1.8 mg/dL — ABNORMAL HIGH (ref 0.2–1.2)
Total Protein: 6.7 g/dL (ref 6.0–8.3)

## 2023-08-05 LAB — HEMOGLOBIN A1C: Hgb A1c MFr Bld: 4.5 % — ABNORMAL LOW (ref 4.6–6.5)

## 2023-08-05 NOTE — Telephone Encounter (Signed)
Chest x ray refaxed to Fax # provided. Confirmation received

## 2023-08-05 NOTE — Patient Instructions (Signed)

## 2023-08-05 NOTE — Progress Notes (Unsigned)
Subjective:  Patient ID: Claudia Lara, female    DOB: 1932-03-02  Age: 89 y.o. MRN: [id number]  CC: Anemia and Congestive Heart Failure   HPI Claudia Lara presents for f/up ---  Discussed the use of AI scribe software for clinical note transcription with the patient, who gave verbal consent to proceed.  History of Present Illness   The patient, with a history of squamous cell carcinoma, recently underwent two surgical interventions for a lesion. Despite these interventions, the lesion was not completely excised, and the patient is awaiting Mohs surgery. She reports feeling generally well, with no new or worsening symptoms related to his cancer.  The patient also reports experiencing shortness of breath when walking, a symptom she has had for some time. She denies chest pain or irregular heartbeat. She has a history of fluid retention in his legs, which she manages intermittently with torsemide due to its diuretic side effects. She denies any recent weight gain.  The patient has a history of urinary tract infections and is on a prophylactic regimen, which he has self-adjusted to once daily due to cost. She denies any current symptoms of a urinary tract infection. She also reports a recent episode of dizziness, which he attributes to a new potassium supplement. The dizziness resolved after discontinuing the supplement.  The patient remains active, spending much of her time gardening.       Outpatient Medications Prior to Visit  Medication Sig Dispense Refill   Calcium Carb-Cholecalciferol (CALCIUM CARBONATE-VITAMIN D3 PO) Take 1 tablet by mouth daily.      carvedilol (COREG) 12.5 MG tablet TAKE 1 TABLET BY MOUTH TWICE A DAY WITH FOOD 180 tablet 1   cholecalciferol (VITAMIN D) 1000 units tablet Take 1,000 Units by mouth daily.     ferrous sulfate 325 (65 FE) MG tablet Take 325 mg by mouth daily with breakfast.     losartan (COZAAR) 25 MG tablet Take 1 tablet (25 mg total) by mouth  daily. 90 tablet 3   methenamine (HIPREX) 1 g tablet TAKE 1 TABLET (1 G TOTAL) BY MOUTH 2 (TWO) TIMES DAILY WITH A MEAL. 180 tablet 1   Multiple Vitamin (MULTIVITAMIN WITH MINERALS) TABS tablet Take 1 tablet by mouth daily.     Multiple Vitamins-Minerals (ZINC PO) Take by mouth.     torsemide (DEMADEX) 20 MG tablet TAKE 1 TABLET BY MOUTH EVERY DAY 90 tablet 1   traZODone (DESYREL) 50 MG tablet Take 1 tablet (50 mg total) by mouth at bedtime as needed for sleep. 90 tablet 1   vitamin C (ASCORBIC ACID) 500 MG tablet Take 500 mg by mouth daily.     No facility-administered medications prior to visit.    ROS Review of Systems  Constitutional: Negative.   HENT: Negative.    Eyes: Negative.   Respiratory:  Positive for shortness of breath. Negative for cough and chest tightness.   Cardiovascular:  Positive for leg swelling. Negative for chest pain and palpitations.  Gastrointestinal:  Negative for abdominal pain, diarrhea, nausea and vomiting.  Endocrine: Negative.   Genitourinary: Negative.  Negative for difficulty urinating.  Musculoskeletal: Negative.  Negative for gait problem.  Skin: Negative.  Negative for color change and pallor.  Neurological: Negative.   Hematological:  Negative for adenopathy. Does not bruise/bleed easily.  Psychiatric/Behavioral: Negative.      Objective:  BP 134/60 (BP Location: Right Arm, Patient Position: Sitting, Cuff Size: Large)   Pulse 79   Temp 98 F (36.7 C) (  Oral)   Ht 5' 3.5" (1.613 m)   Wt 163 lb (73.9 kg)   SpO2 96%   BMI 28.42 kg/m   BP Readings from Last 3 Encounters:  08/05/23 134/60  07/08/23 (!) 152/82  04/04/23 (!) 128/50    Wt Readings from Last 3 Encounters:  08/05/23 163 lb (73.9 kg)  07/08/23 164 lb (74.4 kg)  04/04/23 166 lb (75.3 kg)    Physical Exam Vitals reviewed.  Constitutional:      Appearance: Normal appearance.  HENT:     Mouth/Throat:     Mouth: Mucous membranes are moist.  Eyes:     General: No scleral  icterus.    Conjunctiva/sclera: Conjunctivae normal.  Cardiovascular:     Rate and Rhythm: Normal rate and regular rhythm.     Heart sounds: Murmur heard.     Systolic murmur is present with a grade of 2/6.     No friction rub. No gallop.     Comments: Trace pitting BLE edema  EKG- NSR, 71 bpm Anterior infarct pattern is old +++artifact No LVH or acute changes Unchanged Pulmonary:     Effort: Pulmonary effort is normal.     Breath sounds: No stridor. Examination of the right-lower field reveals rales. Examination of the left-lower field reveals rales. Rales present. No wheezing or rhonchi.  Abdominal:     General: Abdomen is flat.     Palpations: There is no mass.     Tenderness: There is no abdominal tenderness. There is no guarding.     Hernia: No hernia is present.  Musculoskeletal:     Cervical back: Neck supple.     Right lower leg: Edema present.     Left lower leg: Edema present.  Neurological:     General: No focal deficit present.     Mental Status: She is alert. Mental status is at baseline.  Psychiatric:        Mood and Affect: Mood normal.        Behavior: Behavior normal.     Lab Results  Component Value Date   WBC 7.8 08/05/2023   HGB 9.4 (L) 08/05/2023   HCT 28.1 (L) 08/05/2023   PLT 222.0 08/05/2023   GLUCOSE 83 08/05/2023   CHOL 132 11/12/2022   TRIG 120.0 11/12/2022   HDL 53.90 11/12/2022   LDLDIRECT 56.0 07/08/2018   LDLCALC 54 11/12/2022   ALT 12 08/05/2023   AST 19 08/05/2023   NA 142 08/05/2023   K 4.2 08/05/2023   CL 109 08/05/2023   CREATININE 1.11 08/05/2023   BUN 26 (H) 08/05/2023   CO2 26 08/05/2023   TSH 0.90 02/13/2023   INR 1.1 (H) 05/17/2020   HGBA1C 4.5 (L) 08/05/2023    NM PET Image Initial (PI) Skull Base To Thigh  Result Date: 02/01/2023 CLINICAL DATA:  Initial treatment strategy for lung nodule. EXAM: NUCLEAR MEDICINE PET SKULL BASE TO THIGH TECHNIQUE: 8.39 mCi F-18 FDG was injected intravenously. Full-ring PET imaging  was performed from the skull base to thigh after the radiotracer. CT data was obtained and used for attenuation correction and anatomic localization. Fasting blood glucose: 113 mg/dl COMPARISON:  CT chest 05/18/7627 FINDINGS: Mediastinal blood pool activity: SUV max 2.70 Liver activity: SUV max NA NECK: No hypermetabolic lymph nodes in the neck. Incidental CT findings: None. CHEST: No tracer avid supraclavicular, axillary, mediastinal, or hilar lymph nodes. Central right upper lobe lung nodule measures 1.5 cm and has an SUV max of 3.08, image 31/7. On  the CT from 07/02/2019 this measured 0.9 cm within SUV max of 0.89. Incidental CT findings: Aortic atherosclerosis with coronary artery calcifications. ABDOMEN/PELVIS: No abnormal hypermetabolic activity within the liver, pancreas, adrenal glands, or spleen. No hypermetabolic lymph nodes in the abdomen or pelvis. Incidental CT findings: Aortic atherosclerosis. Cholecystectomy. Pneumobilia is identified compatible with prior sphincterotomy and biliary patency. Sigmoid diverticulosis without acute diverticulitis. SKELETON: No focal hypermetabolic activity to suggest skeletal metastasis. Incidental CT findings: None. IMPRESSION: 1. There has interval increase in size and FDG uptake associated with the right upper lobe lung nodule when compared with 01/13/2023. This is suspicious for primary bronchogenic carcinoma. 2. No signs of tracer avid nodal metastasis or distant metastatic disease. 3. Coronary artery calcifications. 4. Status post cholecystectomy with pneumobilia compatible with prior sphincterotomy and biliary patency. 5.  Aortic Atherosclerosis (ICD10-I70.0). Electronically Signed   By: Signa Kell M.D.   On: 02/01/2023 11:53    Assessment & Plan:  Stage 3b chronic kidney disease (HCC) -     Basic metabolic panel; Future -     CBC with Differential/Platelet; Future  Essential hypertension -     CBC with Differential/Platelet; Future  Hyperlipidemia  with target LDL less than 130 -     Hepatic function panel; Future  Iron deficiency anemia due to chronic blood loss -     IBC + Ferritin; Future  Acquired hemolytic anemia (HCC) -     Lactate dehydrogenase; Future -     Haptoglobin; Future  DOE (dyspnea on exertion) -     EKG 12-Lead -     Brain natriuretic peptide; Future  Left ventricular diastolic dysfunction, NYHA class 1 -     Brain natriuretic peptide; Future  Chronic hyperglycemia -     Fructosamine; Future -     Hemoglobin A1c; Future     Follow-up: Return in about 6 months (around 02/02/2024).  Sanda Linger, MD

## 2023-08-07 LAB — LACTATE DEHYDROGENASE: LDH: 201 U/L (ref 120–250)

## 2023-08-07 LAB — HAPTOGLOBIN: Haptoglobin: <10 mg/dL — ABNORMAL LOW (ref 43–212)

## 2023-08-07 LAB — EXTRA SPECIMEN

## 2023-08-07 LAB — FRUCTOSAMINE: Fructosamine: 301 umol/L — ABNORMAL HIGH (ref 205–285)

## 2023-08-08 ENCOUNTER — Other Ambulatory Visit: Payer: Self-pay | Admitting: Internal Medicine

## 2023-08-08 DIAGNOSIS — D599 Acquired hemolytic anemia, unspecified: Secondary | ICD-10-CM

## 2023-08-20 DIAGNOSIS — C44622 Squamous cell carcinoma of skin of right upper limb, including shoulder: Secondary | ICD-10-CM | POA: Diagnosis not present

## 2023-08-27 DIAGNOSIS — C44622 Squamous cell carcinoma of skin of right upper limb, including shoulder: Secondary | ICD-10-CM | POA: Diagnosis not present

## 2023-08-29 ENCOUNTER — Other Ambulatory Visit: Payer: Self-pay | Admitting: *Deleted

## 2023-08-29 DIAGNOSIS — D598 Other acquired hemolytic anemias: Secondary | ICD-10-CM

## 2023-09-01 ENCOUNTER — Inpatient Hospital Stay: Payer: Medicare Other | Attending: Internal Medicine | Admitting: Internal Medicine

## 2023-09-01 ENCOUNTER — Inpatient Hospital Stay: Payer: Medicare Other

## 2023-09-01 ENCOUNTER — Other Ambulatory Visit: Payer: Medicare Other

## 2023-09-01 VITALS — BP 156/59 | HR 82 | Temp 97.9°F | Resp 17 | Ht 60.35 in | Wt 161.8 lb

## 2023-09-01 DIAGNOSIS — I739 Peripheral vascular disease, unspecified: Secondary | ICD-10-CM | POA: Diagnosis not present

## 2023-09-01 DIAGNOSIS — K8020 Calculus of gallbladder without cholecystitis without obstruction: Secondary | ICD-10-CM | POA: Diagnosis not present

## 2023-09-01 DIAGNOSIS — D5919 Other autoimmune hemolytic anemia: Secondary | ICD-10-CM

## 2023-09-01 DIAGNOSIS — Z85828 Personal history of other malignant neoplasm of skin: Secondary | ICD-10-CM | POA: Insufficient documentation

## 2023-09-01 DIAGNOSIS — I509 Heart failure, unspecified: Secondary | ICD-10-CM | POA: Insufficient documentation

## 2023-09-01 DIAGNOSIS — I110 Hypertensive heart disease with heart failure: Secondary | ICD-10-CM | POA: Diagnosis not present

## 2023-09-01 DIAGNOSIS — D598 Other acquired hemolytic anemias: Secondary | ICD-10-CM

## 2023-09-01 DIAGNOSIS — R911 Solitary pulmonary nodule: Secondary | ICD-10-CM | POA: Diagnosis not present

## 2023-09-01 DIAGNOSIS — D591 Autoimmune hemolytic anemia, unspecified: Secondary | ICD-10-CM | POA: Diagnosis not present

## 2023-09-01 DIAGNOSIS — K219 Gastro-esophageal reflux disease without esophagitis: Secondary | ICD-10-CM | POA: Diagnosis not present

## 2023-09-01 DIAGNOSIS — D500 Iron deficiency anemia secondary to blood loss (chronic): Secondary | ICD-10-CM

## 2023-09-01 LAB — DIRECT ANTIGLOBULIN TEST (NOT AT ARMC)
DAT, IgG: NEGATIVE
DAT, complement: NEGATIVE

## 2023-09-01 LAB — CMP (CANCER CENTER ONLY)
ALT: 12 U/L (ref 0–44)
AST: 17 U/L (ref 15–41)
Albumin: 4.3 g/dL (ref 3.5–5.0)
Alkaline Phosphatase: 66 U/L (ref 38–126)
Anion gap: 7 (ref 5–15)
BUN: 30 mg/dL — ABNORMAL HIGH (ref 8–23)
CO2: 25 mmol/L (ref 22–32)
Calcium: 9.3 mg/dL (ref 8.9–10.3)
Chloride: 109 mmol/L (ref 98–111)
Creatinine: 1.33 mg/dL — ABNORMAL HIGH (ref 0.44–1.00)
GFR, Estimated: 38 mL/min — ABNORMAL LOW (ref >60)
Glucose, Bld: 113 mg/dL — ABNORMAL HIGH (ref 70–99)
Potassium: 4.4 mmol/L (ref 3.5–5.1)
Sodium: 141 mmol/L (ref 135–145)
Total Bilirubin: 1.9 mg/dL — ABNORMAL HIGH (ref 0.3–1.2)
Total Protein: 6.5 g/dL (ref 6.5–8.1)

## 2023-09-01 LAB — CBC WITH DIFFERENTIAL (CANCER CENTER ONLY)
Abs Immature Granulocytes: 0.07 10*3/uL (ref 0.00–0.07)
Basophils Absolute: 0.1 10*3/uL (ref 0.0–0.1)
Basophils Relative: 1 %
Eosinophils Absolute: 0.2 10*3/uL (ref 0.0–0.5)
Eosinophils Relative: 3 %
HCT: 28.2 % — ABNORMAL LOW (ref 36.0–46.0)
Hemoglobin: 9.3 g/dL — ABNORMAL LOW (ref 12.0–15.0)
Immature Granulocytes: 1 %
Lymphocytes Relative: 12 %
Lymphs Abs: 1 10*3/uL (ref 0.7–4.0)
MCH: 30.1 pg (ref 26.0–34.0)
MCHC: 33 g/dL (ref 30.0–36.0)
MCV: 91.3 fL (ref 80.0–100.0)
Monocytes Absolute: 0.6 10*3/uL (ref 0.1–1.0)
Monocytes Relative: 8 %
Neutro Abs: 6.1 10*3/uL (ref 1.7–7.7)
Neutrophils Relative %: 75 %
Platelet Count: 223 10*3/uL (ref 150–400)
RBC: 3.09 MIL/uL — ABNORMAL LOW (ref 3.87–5.11)
RDW: 20.3 % — ABNORMAL HIGH (ref 11.5–15.5)
WBC Count: 8 10*3/uL (ref 4.0–10.5)
nRBC: 0 % (ref 0.0–0.2)

## 2023-09-01 LAB — RETIC PANEL
Immature Retic Fract: 10.9 % (ref 2.3–15.9)
RBC.: 3.01 MIL/uL — ABNORMAL LOW (ref 3.87–5.11)
Retic Count, Absolute: 223 10*3/uL — ABNORMAL HIGH (ref 19.0–186.0)
Retic Ct Pct: 7.4 % — ABNORMAL HIGH (ref 0.4–3.1)
Reticulocyte Hemoglobin: 33.6 pg (ref >27.9)

## 2023-09-01 LAB — FOLATE: Folate: 34.2 ng/mL (ref >5.9)

## 2023-09-01 LAB — IRON AND IRON BINDING CAPACITY (CC-WL,HP ONLY)
Iron: 93 ug/dL (ref 28–170)
Saturation Ratios: 45 % — ABNORMAL HIGH (ref 10.4–31.8)
TIBC: 209 ug/dL — ABNORMAL LOW (ref 250–450)
UIBC: 116 ug/dL — ABNORMAL LOW (ref 148–442)

## 2023-09-01 LAB — VITAMIN B12: Vitamin B-12: 336 pg/mL (ref 180–914)

## 2023-09-01 LAB — LACTATE DEHYDROGENASE: LDH: 200 U/L — ABNORMAL HIGH (ref 98–192)

## 2023-09-01 LAB — FERRITIN: Ferritin: 1061 ng/mL — ABNORMAL HIGH (ref 11–307)

## 2023-09-01 NOTE — Progress Notes (Signed)
[facility] [phone]   [phone]  CONSULT NOTE  REFERRING PHYSICIAN: Dr. Sanda Linger  REASON FOR CONSULTATION:  89 years old white female with suspicious autoimmune hemolytic anemia.  HPI Claudia Lara is a 89 y.o. female with past medical history significant for aortic stenosis, aortic regurgitation, carotid artery stenosis, gallbladder stones, GERD, history of anemia, internal hemorrhoids, peripheral vascular disease and several family members with malignancy.  The patient was seen by her primary care provider complaining of shortness of breath when walking during her evaluation she had blood work on 08/05/2023 and showed hemoglobin of 9.4 and hematocrit 28.1 with normal MCV of 88.7.  She has normal total white blood count of 7.8 and normal platelet count of 222,000.  She had iron study on the same day and that showed normal serum iron of 71 with iron saturation of 32.5% and ferritin level was elevated at 917.7.  She had haptoglobin level that was less than 10.  Total bilirubin was 1.8 with direct bilirubin of 0.4 but she had similar results that has been going on for several years.  Patient also has a history of congestive heart failure and BNP was 482.  Of note that the patient also has a suspicious right upper lobe lung nodule that was hypermetabolic and increased in size since her previous scan in February 2024.  She is followed by Dr. Isaiah Serge with [facility] but I do not see any biopsy reported. She was referred to me today for evaluation and recommendation regarding her anemia. Discussed the use of AI scribe software for clinical note transcription with the patient, who gave verbal consent to proceed.  History of Present Illness   Claudia Lara, an 89 year old white female, presents with a longstanding history of anemia. The patient was previously seen in 2016 for the same issue, but reports that the condition did not improve and has  been living with it since. The patient's recent blood work showed a hemoglobin level of 9.4 and hematocrit of 28.1, indicating persistent anemia. The patient reports feeling generally well, but experiences fatigue and needs to rest frequently, especially when engaging in physical activities such as yard work. The patient occasionally experiences dizziness, particularly when changing positions rapidly, but does not find it bothersome. The patient also reports occasional shortness of breath, but manages it by resting when necessary.  The patient has a history of aortic stenosis, aortic regurgitation, carotid artery stenosis, acid reflux, peripheral vascular disease, and gallbladder stones. The patient also has a nodule in the right upper lobe of the lung, which is currently under observation. The patient denies any history of diabetes, heart attacks, or strokes, but has been diagnosed with high blood pressure and is on medication for the same.  The patient's medication regimen includes multivitamins, ferrous sulfate, Cozaar, Coreg, and torsemide for congestive heart failure. The patient also takes Hiberix for the prevention of UTIs. The patient has a history of skin cancer on the right wrist, which was recently removed. The patient denies any recent weight loss, nausea, vomiting, or diarrhea. The patient has never smoked, does not drink alcohol, and denies any use of street drugs. The patient's family history includes ovarian cancer in the mother and leukemia in the father. The patient has one daughter and is currently divorced.       HPI  Past Medical History:  Diagnosis Date   ANEMIA 08/22/2009   Aortic stenosis    AR (aortic regurgitation) 01/2019  Mild to Moderate, Noted on ECHO   CAROTID ARTERY STENOSIS 01/16/2010   Complication of anesthesia    very hard to wake up from surgery   DYSPNEA ON EXERTION 11/16/2007   Gallstones    GEN OSTEOARTHROSIS INVOLVING MULTIPLE SITES 11/11/2008   GERD  (gastroesophageal reflux disease)    Grade I diastolic dysfunction 01/2018   Noted on ECHO   Heart murmur    HEMORRHOIDS, INTERNAL    surgery was in the 90's (per patient)   History of blood transfusion    after hip and knee replacements   NECK PAIN, ACUTE 12/28/2009   PERIPHERAL EDEMA 10/06/2007   PONV (postoperative nausea and vomiting)    Pulmonary nodule 06/18/2019   noted on CT Chest    PVD 10/06/2007   Unspecified essential hypertension 10/19/2007    Past Surgical History:  Procedure Laterality Date   ABDOMINAL HYSTERECTOMY     BIOPSY  08/06/2019   Procedure: BIOPSY;  Surgeon: Iva Boop, MD;  Location: [facility];  Service: Gastroenterology;;   Arbutus Leas     bilat   CARPAL TUNNEL RELEASE     CHOLECYSTECTOMY  01/29/2016   at [facility]   CHOLECYSTECTOMY     ENDOSCOPIC RETROGRADE CHOLANGIOPANCREATOGRAPHY (ERCP) WITH PROPOFOL N/A 12/01/2015   Procedure: ENDOSCOPIC RETROGRADE CHOLANGIOPANCREATOGRAPHY (ERCP) WITH PROPOFOL;  Surgeon: Rachael Fee, MD;  Location: [facility];  Service: Endoscopy;  Laterality: N/A;   ENDOSCOPIC RETROGRADE CHOLANGIOPANCREATOGRAPHY (ERCP) WITH PROPOFOL N/A 02/01/2016   Procedure: ENDOSCOPIC RETROGRADE CHOLANGIOPANCREATOGRAPHY (ERCP) WITH PROPOFOL;  Surgeon: Rachael Fee, MD;  Location: [facility];  Service: Endoscopy;  Laterality: N/A;   ENDOSCOPIC RETROGRADE CHOLANGIOPANCREATOGRAPHY (ERCP) WITH PROPOFOL N/A 04/07/2018   Procedure: ENDOSCOPIC RETROGRADE CHOLANGIOPANCREATOGRAPHY (ERCP) WITH PROPOFOL;  Surgeon: Rachael Fee, MD;  Location: [facility];  Service: Endoscopy;  Laterality: N/A;   ESOPHAGOGASTRODUODENOSCOPY (EGD) WITH PROPOFOL N/A 08/06/2019   Procedure: ESOPHAGOGASTRODUODENOSCOPY (EGD) WITH PROPOFOL;  Surgeon: Iva Boop, MD;  Location: [facility];  Service: Gastroenterology;  Laterality: N/A;   HAMMER TOE SURGERY     HEMORRHOID SURGERY     Hyperplastic colon polyps, removed  2007   By Dr. Evette Cristal    JOINT REPLACEMENT  2011   right knee   OOPHORECTOMY     REMOVAL OF STONES  04/07/2018   Procedure: REMOVAL OF STONES;  Surgeon: Rachael Fee, MD;  Location: [facility];  Service: Endoscopy;;   ROTATOR CUFF REPAIR  2004   left (Dr. Cleophas Dunker)   SPHINCTEROTOMY  04/07/2018   Procedure: Dennison Mascot;  Surgeon: Rachael Fee, MD;  Location: [facility];  Service: Endoscopy;;   TOTAL HIP ARTHROPLASTY Right 11/26/2016   Procedure: RIGHT TOTAL HIP ARTHROPLASTY;  Surgeon: Valeria Batman, MD;  Location: [facility];  Service: Orthopedics;  Laterality: Right;   TOTAL KNEE ARTHROPLASTY Right    TOTAL KNEE ARTHROPLASTY Left 07/26/2019   Procedure: TOTAL KNEE ARTHROPLASTY;  Surgeon: Ollen Gross, MD;  Location: [facility];  Service: Orthopedics;  Laterality: Left;     Family History  Problem Relation Age of Onset   Ovarian cancer Mother    Cancer Mother    Leukemia Father    Lung cancer Father    Cancer Father    Cancer Brother    Diabetes Neg Hx    Heart disease Neg Hx    Hypertension Neg Hx     Social History Social History   Tobacco Use   Smoking status: Never   Smokeless tobacco: Never  Vaping Use  Vaping status: Never Used  Substance Use Topics   Alcohol use: No   Drug use: No    Allergies  Allergen Reactions   Sulfonamide Derivatives Rash    Childhood reaction    Current Outpatient Medications  Medication Sig Dispense Refill   Calcium Carb-Cholecalciferol (CALCIUM CARBONATE-VITAMIN D3 PO) Take 1 tablet by mouth daily.      carvedilol (COREG) 12.5 MG tablet TAKE 1 TABLET BY MOUTH TWICE A DAY WITH FOOD 180 tablet 1   cholecalciferol (VITAMIN D) 1000 units tablet Take 1,000 Units by mouth daily.     ferrous sulfate 325 (65 FE) MG tablet Take 325 mg by mouth daily with breakfast.     losartan (COZAAR) 25 MG tablet Take 1 tablet (25 mg total) by mouth daily. 90 tablet 3   methenamine (HIPREX) 1 g tablet TAKE 1 TABLET (1 G TOTAL) BY MOUTH 2 (TWO) TIMES DAILY WITH A  MEAL. 180 tablet 1   Multiple Vitamin (MULTIVITAMIN WITH MINERALS) TABS tablet Take 1 tablet by mouth daily.     Multiple Vitamins-Minerals (ZINC PO) Take by mouth.     torsemide (DEMADEX) 20 MG tablet TAKE 1 TABLET BY MOUTH EVERY DAY 90 tablet 1   traZODone (DESYREL) 50 MG tablet Take 1 tablet (50 mg total) by mouth at bedtime as needed for sleep. 90 tablet 1   vitamin C (ASCORBIC ACID) 500 MG tablet Take 500 mg by mouth daily.     No current facility-administered medications for this visit.    Review of Systems  Constitutional: positive for fatigue Eyes: negative Ears, nose, mouth, throat, and face: negative Respiratory: positive for dyspnea on exertion Cardiovascular: negative Gastrointestinal: negative Genitourinary:negative Integument/breast: negative Hematologic/lymphatic: negative Musculoskeletal:negative Neurological: negative Behavioral/Psych: negative Endocrine: negative Allergic/Immunologic: negative  Physical Exam  ZOX:WRUEA, healthy, no distress, well nourished, and well developed SKIN: skin color, texture, turgor are normal, no rashes or significant lesions HEAD: Normocephalic, No masses, lesions, tenderness or abnormalities EYES: normal, PERRLA, Conjunctiva are pink and non-injected EARS: External ears normal, Canals clear OROPHARYNX:no exudate, no erythema, and lips, buccal mucosa, and tongue normal  NECK: supple, no adenopathy, no JVD LYMPH:  no palpable lymphadenopathy, no hepatosplenomegaly BREAST:not examined LUNGS: clear to auscultation , and palpation HEART: regular rate & rhythm, no murmurs, and no gallops ABDOMEN:abdomen soft, non-tender, normal bowel sounds, and no masses or organomegaly BACK: Back symmetric, no curvature., No CVA tenderness EXTREMITIES:no joint deformities, effusion, or inflammation, no edema  NEURO: alert & oriented x 3 with fluent speech, no focal motor/sensory deficits  PERFORMANCE STATUS: ECOG 1  LABORATORY DATA: Lab  Results  Component Value Date   WBC 8.0 09/01/2023   HGB 9.3 (L) 09/01/2023   HCT 28.2 (L) 09/01/2023   MCV 91.3 09/01/2023   PLT 223 09/01/2023      Chemistry      Component Value Date/Time   NA 142 08/05/2023 1347   NA 143 02/10/2018 1126   NA 141 07/12/2015 1441   K 4.2 08/05/2023 1347   K 4.5 07/12/2015 1441   CL 109 08/05/2023 1347   CO2 26 08/05/2023 1347   CO2 21 (L) 07/12/2015 1441   BUN 26 (H) 08/05/2023 1347   BUN 26 02/10/2018 1126   BUN 33.3 (H) 07/12/2015 1441   CREATININE 1.11 08/05/2023 1347   CREATININE 1.7 (H) 07/12/2015 1441   GLU 158 12/02/2016 0000      Component Value Date/Time   CALCIUM 9.3 08/05/2023 1347   CALCIUM 8.9 07/12/2015 1441  ALKPHOS 68 08/05/2023 1347   ALKPHOS 79 07/12/2015 1441   AST 19 08/05/2023 1347   AST 15 07/12/2015 1441   ALT 12 08/05/2023 1347   ALT 12 07/12/2015 1441   BILITOT 1.8 (H) 08/05/2023 1347   BILITOT 1.64 (H) 07/12/2015 1441       RADIOGRAPHIC STUDIES: No results found.  ASSESSMENT AND PLAN:    Anemia Chronic anemia with recent decrease in hemoglobin to 9.3. Iron studies normal. Some data suggestive of hemolysis, but other data conflicting. Possible hemolysis due to aortic valve disease. -Continue iron supplements and multivitamins. -Order extensive blood work to investigate cause of anemia, including repeat haptoglobin, LDH, Reticulocyte count, serum floate, Vitamin B12 and DAT.. -Consider steroids for treatment of hemolytic anemia, pending results of blood work. -Follow-up in 1 month to discuss results and further management.  Aortic Valve Disease Patient unaware of diagnosis. Possible contribution to hemolytic anemia. -No immediate plan, pending results of blood work for anemia.  Right Lung Nodule Known nodule being monitored by Dr. Tasia Catchings. No recent follow-up or biopsy. May need sooner evaluation. -No immediate plan, continue monitoring as per Dr. Elijah Birk plan.  Congestive Heart  Failure Managed with Torsemide as needed for fluid overload. -Continue current management.  Urinary Tract Infections Prophylaxis with Hiprix 1g daily, effective. -Continue current management.  Gastroesophageal Reflux Disease No current symptoms reported. -No immediate plan.  Peripheral Vascular Disease No current symptoms reported. -No immediate plan.  Gallstones No current symptoms reported. -No immediate plan.  Hypertension Managed with Cozaar and Coreg. -Continue current management.    The patient voices understanding of current disease status and treatment options and is in agreement with the current care plan.  All questions were answered. The patient knows to call the clinic with any problems, questions or concerns. We can certainly see the patient much sooner if necessary.  Thank you so much for allowing me to participate in the care of Abbott Pao. I will continue to follow up the patient with you and assist in her care.  The total time spent in the appointment was 60 minutes.  Disclaimer: This note was dictated with voice recognition software. Similar sounding words can inadvertently be transcribed and may not be corrected upon review.   Lajuana Matte September 01, 2023, 12:12 PM

## 2023-09-02 LAB — HAPTOGLOBIN: Haptoglobin: <10 mg/dL — ABNORMAL LOW (ref 41–333)

## 2023-09-05 ENCOUNTER — Other Ambulatory Visit: Payer: Self-pay | Admitting: Internal Medicine

## 2023-09-05 DIAGNOSIS — I10 Essential (primary) hypertension: Secondary | ICD-10-CM

## 2023-09-05 DIAGNOSIS — I5189 Other ill-defined heart diseases: Secondary | ICD-10-CM

## 2023-09-17 ENCOUNTER — Other Ambulatory Visit: Payer: Self-pay | Admitting: Internal Medicine

## 2023-09-17 ENCOUNTER — Telehealth: Payer: Self-pay | Admitting: Internal Medicine

## 2023-09-17 DIAGNOSIS — D500 Iron deficiency anemia secondary to blood loss (chronic): Secondary | ICD-10-CM

## 2023-09-17 NOTE — Telephone Encounter (Signed)
Scheduled appointment per Diane RN. Patient is aware of the made appointments.

## 2023-10-02 ENCOUNTER — Inpatient Hospital Stay: Payer: Medicare Other | Admitting: Internal Medicine

## 2023-10-02 ENCOUNTER — Inpatient Hospital Stay: Payer: Medicare Other | Attending: Internal Medicine

## 2023-10-02 VITALS — BP 165/55 | HR 71 | Temp 97.7°F | Resp 17 | Ht 60.35 in | Wt 161.6 lb

## 2023-10-02 DIAGNOSIS — D649 Anemia, unspecified: Secondary | ICD-10-CM | POA: Diagnosis not present

## 2023-10-02 DIAGNOSIS — D591 Autoimmune hemolytic anemia, unspecified: Secondary | ICD-10-CM

## 2023-10-02 DIAGNOSIS — D500 Iron deficiency anemia secondary to blood loss (chronic): Secondary | ICD-10-CM

## 2023-10-02 LAB — CBC WITH DIFFERENTIAL (CANCER CENTER ONLY)
Abs Immature Granulocytes: 0.08 10*3/uL — ABNORMAL HIGH (ref 0.00–0.07)
Basophils Absolute: 0.1 10*3/uL (ref 0.0–0.1)
Basophils Relative: 1 %
Eosinophils Absolute: 0.2 10*3/uL (ref 0.0–0.5)
Eosinophils Relative: 2 %
HCT: 28.4 % — ABNORMAL LOW (ref 36.0–46.0)
Hemoglobin: 9.7 g/dL — ABNORMAL LOW (ref 12.0–15.0)
Immature Granulocytes: 1 %
Lymphocytes Relative: 14 %
Lymphs Abs: 1.3 10*3/uL (ref 0.7–4.0)
MCH: 30.6 pg (ref 26.0–34.0)
MCHC: 34.2 g/dL (ref 30.0–36.0)
MCV: 89.6 fL (ref 80.0–100.0)
Monocytes Absolute: 0.7 10*3/uL (ref 0.1–1.0)
Monocytes Relative: 7 %
Neutro Abs: 7 10*3/uL (ref 1.7–7.7)
Neutrophils Relative %: 75 %
Platelet Count: 218 10*3/uL (ref 150–400)
RBC: 3.17 MIL/uL — ABNORMAL LOW (ref 3.87–5.11)
RDW: 20.8 % — ABNORMAL HIGH (ref 11.5–15.5)
WBC Count: 9.3 10*3/uL (ref 4.0–10.5)
nRBC: 0 % (ref 0.0–0.2)

## 2023-10-02 LAB — FERRITIN: Ferritin: 903 ng/mL — ABNORMAL HIGH (ref 11–307)

## 2023-10-02 LAB — IRON AND IRON BINDING CAPACITY (CC-WL,HP ONLY)
Iron: 75 ug/dL (ref 28–170)
Saturation Ratios: 36 % — ABNORMAL HIGH (ref 10.4–31.8)
TIBC: 211 ug/dL — ABNORMAL LOW (ref 250–450)
UIBC: 136 ug/dL — ABNORMAL LOW (ref 148–442)

## 2023-10-02 MED ORDER — PREDNISONE 10 MG PO TABS: 5.0000 mg | ORAL_TABLET | Freq: Every day | ORAL | 1 refills | Status: DC

## 2023-10-02 NOTE — Progress Notes (Signed)
[facility] [phone]   [phone]  OFFICE PROGRESS NOTE  Etta Grandchild, MD [address] [place] [address]  DIAGNOSIS: Chronic anemia of unclear etiology questionable for hemolytic anemia with low haptoglobin  PRIOR THERAPY: None  CURRENT THERAPY: Prednisone 10 mg p.o. daily start October 03, 2023  INTERVAL HISTORY: Claudia Lara 89 y.o. female returns to the clinic today for follow-up visit.Discussed the use of AI scribe software for clinical note transcription with the patient, who gave verbal consent to proceed.  History of Present Illness   The patient, an 89 year old with a longstanding history of anemia, was last seen a month ago. At that time, the cause of the persistent anemia was unknown, but hemolytic anemia was suspected. The patient's iron and ferritin levels were normal, as were vitamin B12 and folic acid levels. However, a haptoglobin test returned very low, indicating possible hemolytic anemia.  The patient reports feeling well, with no new complaints. She remains active, working in her yard regularly. There are no reports of nausea, vomiting, diarrhea, bleeding issues, headaches, or changes in vision.  The patient's recent blood work shows a slight improvement in hemoglobin levels, from 9.3 to 9.7 within a month. White blood count and platelets are normal. Iron studies show a serum iron of 75 and iron saturation of 36%, indicating good iron levels.  The patient has a history of taking prednisone, a steroid, but discontinued it due to sleep disturbances. She expresses hesitation about starting it again, even at a low dose, due to previous sleep issues.       MEDICAL HISTORY: Past Medical History:  Diagnosis Date   ANEMIA 08/22/2009   Aortic stenosis    AR (aortic regurgitation) 01/2019   Mild to Moderate, Noted on ECHO   CAROTID ARTERY STENOSIS 01/16/2010   Complication of anesthesia    very hard to wake up from  surgery   DYSPNEA ON EXERTION 11/16/2007   Gallstones    GEN OSTEOARTHROSIS INVOLVING MULTIPLE SITES 11/11/2008   GERD (gastroesophageal reflux disease)    Grade I diastolic dysfunction 01/2018   Noted on ECHO   Heart murmur    HEMORRHOIDS, INTERNAL    surgery was in the 90's (per patient)   History of blood transfusion    after hip and knee replacements   NECK PAIN, ACUTE 12/28/2009   PERIPHERAL EDEMA 10/06/2007   PONV (postoperative nausea and vomiting)    Pulmonary nodule 06/18/2019   noted on CT Chest    PVD 10/06/2007   Unspecified essential hypertension 10/19/2007    ALLERGIES:  is allergic to sulfonamide derivatives.  MEDICATIONS:  Current Outpatient Medications  Medication Sig Dispense Refill   Calcium Carb-Cholecalciferol (CALCIUM CARBONATE-VITAMIN D3 PO) Take 1 tablet by mouth daily.      carvedilol (COREG) 12.5 MG tablet TAKE 1 TABLET BY MOUTH TWICE A DAY WITH FOOD 180 tablet 1   cholecalciferol (VITAMIN D) 1000 units tablet Take 1,000 Units by mouth daily.     ferrous sulfate 325 (65 FE) MG tablet Take 325 mg by mouth daily with breakfast.     losartan (COZAAR) 25 MG tablet Take 1 tablet (25 mg total) by mouth daily. 90 tablet 3   methenamine (HIPREX) 1 g tablet TAKE 1 TABLET (1 G TOTAL) BY MOUTH 2 (TWO) TIMES DAILY WITH A MEAL. 180 tablet 1   Multiple Vitamin (MULTIVITAMIN WITH MINERALS) TABS tablet Take 1 tablet by mouth daily.  Multiple Vitamins-Minerals (ZINC PO) Take by mouth.     torsemide (DEMADEX) 20 MG tablet TAKE 1 TABLET BY MOUTH EVERY DAY 90 tablet 1   vitamin C (ASCORBIC ACID) 500 MG tablet Take 500 mg by mouth daily.     No current facility-administered medications for this visit.    SURGICAL HISTORY:  Past Surgical History:  Procedure Laterality Date   ABDOMINAL HYSTERECTOMY     BIOPSY  08/06/2019   Procedure: BIOPSY;  Surgeon: Iva Boop, MD;  Location: [facility];  Service: Gastroenterology;;   BUNIONECTOMY     bilat   CARPAL TUNNEL  RELEASE     CHOLECYSTECTOMY  01/29/2016   [facility]   CHOLECYSTECTOMY     ENDOSCOPIC RETROGRADE CHOLANGIOPANCREATOGRAPHY (ERCP) WITH PROPOFOL N/A 12/01/2015   Procedure: ENDOSCOPIC RETROGRADE CHOLANGIOPANCREATOGRAPHY (ERCP) WITH PROPOFOL;  Surgeon: Rachael Fee, MD;  Location: [facility];  Service: Endoscopy;  Laterality: N/A;   ENDOSCOPIC RETROGRADE CHOLANGIOPANCREATOGRAPHY (ERCP) WITH PROPOFOL N/A 02/01/2016   Procedure: ENDOSCOPIC RETROGRADE CHOLANGIOPANCREATOGRAPHY (ERCP) WITH PROPOFOL;  Surgeon: Rachael Fee, MD;  Location: [facility];  Service: Endoscopy;  Laterality: N/A;   ENDOSCOPIC RETROGRADE CHOLANGIOPANCREATOGRAPHY (ERCP) WITH PROPOFOL N/A 04/07/2018   Procedure: ENDOSCOPIC RETROGRADE CHOLANGIOPANCREATOGRAPHY (ERCP) WITH PROPOFOL;  Surgeon: Rachael Fee, MD;  Location: [facility];  Service: Endoscopy;  Laterality: N/A;   ESOPHAGOGASTRODUODENOSCOPY (EGD) WITH PROPOFOL N/A 08/06/2019   Procedure: ESOPHAGOGASTRODUODENOSCOPY (EGD) WITH PROPOFOL;  Surgeon: Iva Boop, MD;  Location: [facility];  Service: Gastroenterology;  Laterality: N/A;   HAMMER TOE SURGERY     HEMORRHOID SURGERY     Hyperplastic colon polyps, removed  2007   By Dr. Evette Cristal   JOINT REPLACEMENT  2011   right knee   OOPHORECTOMY     REMOVAL OF STONES  04/07/2018   Procedure: REMOVAL OF STONES;  Surgeon: Rachael Fee, MD;  Location: [facility];  Service: Endoscopy;;   ROTATOR CUFF REPAIR  2004   left (Dr. Cleophas Dunker)   SPHINCTEROTOMY  04/07/2018   Procedure: Dennison Mascot;  Surgeon: Rachael Fee, MD;  Location: [facility];  Service: Endoscopy;;   TOTAL HIP ARTHROPLASTY Right 11/26/2016   Procedure: RIGHT TOTAL HIP ARTHROPLASTY;  Surgeon: Valeria Batman, MD;  Location: [facility] OR;  Service: Orthopedics;  Laterality: Right;   TOTAL KNEE ARTHROPLASTY Right    TOTAL KNEE ARTHROPLASTY Left 07/26/2019   Procedure: TOTAL KNEE ARTHROPLASTY;  Surgeon: Ollen Gross, MD;  Location:  [facility];  Service: Orthopedics;  Laterality: Left;     REVIEW OF SYSTEMS:  A comprehensive review of systems was negative.   PHYSICAL EXAMINATION: General appearance: alert, cooperative, appears stated age, and no distress Head: Normocephalic, without obvious abnormality, atraumatic Neck: no adenopathy, no JVD, supple, symmetrical, trachea midline, and thyroid not enlarged, symmetric, no tenderness/mass/nodules Lymph nodes: Cervical, supraclavicular, and axillary nodes normal. Resp: clear to auscultation bilaterally Back: symmetric, no curvature. ROM normal. No CVA tenderness. Cardio: regular rate and rhythm, S1, S2 normal, no murmur, click, rub or gallop GI: soft, non-tender; bowel sounds normal; no masses,  no organomegaly Extremities: extremities normal, atraumatic, no cyanosis or edema  ECOG PERFORMANCE STATUS: 1 - Symptomatic but completely ambulatory  Blood pressure (!) 165/55, pulse 71, temperature 97.7 F (36.5 C), temperature source Oral, resp. rate 17, height 5' 0.35" (1.533 m), weight 161 lb 9.6 oz (73.3 kg), SpO2 98%.  LABORATORY DATA: Lab Results  Component Value Date   WBC 9.3 10/02/2023   HGB 9.7 (L) 10/02/2023   HCT  28.4 (L) 10/02/2023   MCV 89.6 10/02/2023   PLT 218 10/02/2023      Chemistry      Component Value Date/Time   NA 141 09/01/2023 1132   NA 143 02/10/2018 1126   NA 141 07/12/2015 1441   K 4.4 09/01/2023 1132   K 4.5 07/12/2015 1441   CL 109 09/01/2023 1132   CO2 25 09/01/2023 1132   CO2 21 (L) 07/12/2015 1441   BUN 30 (H) 09/01/2023 1132   BUN 26 02/10/2018 1126   BUN 33.3 (H) 07/12/2015 1441   CREATININE 1.33 (H) 09/01/2023 1132   CREATININE 1.7 (H) 07/12/2015 1441   GLU 158 12/02/2016 0000      Component Value Date/Time   CALCIUM 9.3 09/01/2023 1132   CALCIUM 8.9 07/12/2015 1441   ALKPHOS 66 09/01/2023 1132   ALKPHOS 79 07/12/2015 1441   AST 17 09/01/2023 1132   AST 15 07/12/2015 1441   ALT 12 09/01/2023 1132   ALT 12  07/12/2015 1441   BILITOT 1.9 (H) 09/01/2023 1132   BILITOT 1.64 (H) 07/12/2015 1441       RADIOGRAPHIC STUDIES: No results found.  ASSESSMENT AND PLAN: This is a very pleasant 89 years old white female with chronic anemia likely secondary to mild underlying hemolysis of unknown etiology.    Chronic Anemia Persistent anemia with low haptoglobin suggestive of hemolytic anemia. Hemoglobin slightly improved from 9.3 to 9.7 over the past month. Iron studies normal. Patient is asymptomatic and maintains an active lifestyle. -Start Prednisone 10mg  daily to potentially reduce hemolysis. -Monitor for side effects including insomnia and hyperglycemia. -Repeat labs and follow-up in 1 month.    The patient voices understanding of current disease status and treatment options and is in agreement with the current care plan.  All questions were answered. The patient knows to call the clinic with any problems, questions or concerns. We can certainly see the patient much sooner if necessary. The total time spent in the appointment was 20 minutes.  Disclaimer: This note was dictated with voice recognition software. Similar sounding words can inadvertently be transcribed and may not be corrected upon review.

## 2023-10-06 ENCOUNTER — Telehealth: Payer: Self-pay | Admitting: Internal Medicine

## 2023-10-06 NOTE — Telephone Encounter (Signed)
Scheduled appointment per 11/7 los. Patient is aware of the made appointments.

## 2023-10-29 ENCOUNTER — Telehealth: Payer: Self-pay | Admitting: Internal Medicine

## 2023-10-29 NOTE — Telephone Encounter (Signed)
Rescheduled appointments per patients request via incoming call. Patient is aware of the changes made to her upcoming appointments. 

## 2023-10-30 ENCOUNTER — Inpatient Hospital Stay: Payer: Medicare Other

## 2023-10-30 ENCOUNTER — Inpatient Hospital Stay: Payer: Medicare Other | Admitting: Internal Medicine

## 2023-11-10 NOTE — Progress Notes (Signed)
[facility] OFFICE PROGRESS NOTE  Etta Grandchild, MD [address] [place] [address]  DIAGNOSIS: Chronic anemia of unclear etiology questionable for hemolytic anemia with low haptoglobin   PRIOR THERAPY: None   CURRENT THERAPY: Prednisone 10 mg p.o. daily start October 03, 2023   INTERVAL HISTORY: Claudia Lara 89 y.o. female returns the clinic today for a follow-up visit.  The patient was last seen in the clinic by Dr. Arbutus Ped on 10/02/2023.  The patient has suspicious hemolytic anemia.  Her iron and ferritin levels were normal her B12 and folic acid were normal.  However her haptoglobin was low indicating possible hemolytic anemia.  Dr. Arbutus Ped placed the patient on prednisone.  He was previously reporting sleep disturbances with prednisone and was not taking it.  It looks like this was last filled by Dr. Arbutus Ped on 10/03/2023.  I asked the patient today she was taking and she stated that she "ran out" when her 30 days was.  Her prescription did have 1 refill on it.  She is not able to tell me how long she has been off of this.  If she filled her prescription from 10/03/2023 she would have ran out approximately 2 weeks ago.  It is unclear if she was taking 5 or 10 mg.  She prefers to take the lowest dose possible to manage her symptoms due to the insomnia.  Presently, she is feeling "the same" today.  She reports her energy is stable.  She has lightheadedness every once in a while.  She sometimes gets dyspnea exertion at baseline which improves with rest.  She denies any syncope.  She has easy bruising at baseline if she hits her extremity but denies any increased bruising lately.  Denies any other abnormal bleeding such as epistaxis, gingival bleeding, hemoptysis, hematemesis, melena, or hematochezia.  She denies any jaundice or itching.  She is here today for evaluation and repeat blood work.  MEDICAL HISTORY: Past Medical History:  Diagnosis Date   ANEMIA 08/22/2009    Aortic stenosis    AR (aortic regurgitation) 01/2019   Mild to Moderate, Noted on ECHO   CAROTID ARTERY STENOSIS 01/16/2010   Complication of anesthesia    very hard to wake up from surgery   DYSPNEA ON EXERTION 11/16/2007   Gallstones    GEN OSTEOARTHROSIS INVOLVING MULTIPLE SITES 11/11/2008   GERD (gastroesophageal reflux disease)    Grade I diastolic dysfunction 01/2018   Noted on ECHO   Heart murmur    HEMORRHOIDS, INTERNAL    surgery was in the 90's (per patient)   History of blood transfusion    after hip and knee replacements   NECK PAIN, ACUTE 12/28/2009   PERIPHERAL EDEMA 10/06/2007   PONV (postoperative nausea and vomiting)    Pulmonary nodule 06/18/2019   noted on CT Chest    PVD 10/06/2007   Unspecified essential hypertension 10/19/2007    ALLERGIES:  is allergic to sulfonamide derivatives.  MEDICATIONS:  Current Outpatient Medications  Medication Sig Dispense Refill   Calcium Carb-Cholecalciferol (CALCIUM CARBONATE-VITAMIN D3 PO) Take 1 tablet by mouth daily.      carvedilol (COREG) 12.5 MG tablet TAKE 1 TABLET BY MOUTH TWICE A DAY WITH FOOD 180 tablet 1   cholecalciferol (VITAMIN D) 1000 units tablet Take 1,000 Units by mouth daily.     ferrous sulfate 325 (65 FE) MG tablet Take 325 mg by mouth daily with breakfast.     losartan (COZAAR) 25 MG tablet  Take 1 tablet (25 mg total) by mouth daily. 90 tablet 3   methenamine (HIPREX) 1 g tablet TAKE 1 TABLET (1 G TOTAL) BY MOUTH 2 (TWO) TIMES DAILY WITH A MEAL. 180 tablet 1   Multiple Vitamin (MULTIVITAMIN WITH MINERALS) TABS tablet Take 1 tablet by mouth daily.     Multiple Vitamins-Minerals (ZINC PO) Take by mouth.     predniSONE (DELTASONE) 10 MG tablet Take 1 tablet (10 mg total) by mouth daily with breakfast. 30 tablet 1   torsemide (DEMADEX) 20 MG tablet TAKE 1 TABLET BY MOUTH EVERY DAY 90 tablet 1   vitamin C (ASCORBIC ACID) 500 MG tablet Take 500 mg by mouth daily.     No current facility-administered  medications for this visit.    SURGICAL HISTORY:  Past Surgical History:  Procedure Laterality Date   ABDOMINAL HYSTERECTOMY     BIOPSY  08/06/2019   Procedure: BIOPSY;  Surgeon: Iva Boop, MD;  Location: [facility];  Service: Gastroenterology;;   BUNIONECTOMY     bilat   CARPAL TUNNEL RELEASE     CHOLECYSTECTOMY  01/29/2016   at [facility]   CHOLECYSTECTOMY     ENDOSCOPIC RETROGRADE CHOLANGIOPANCREATOGRAPHY (ERCP) WITH PROPOFOL N/A 12/01/2015   Procedure: ENDOSCOPIC RETROGRADE CHOLANGIOPANCREATOGRAPHY (ERCP) WITH PROPOFOL;  Surgeon: Rachael Fee, MD;  Location: [facility];  Service: Endoscopy;  Laterality: N/A;   ENDOSCOPIC RETROGRADE CHOLANGIOPANCREATOGRAPHY (ERCP) WITH PROPOFOL N/A 02/01/2016   Procedure: ENDOSCOPIC RETROGRADE CHOLANGIOPANCREATOGRAPHY (ERCP) WITH PROPOFOL;  Surgeon: Rachael Fee, MD;  Location: [facility];  Service: Endoscopy;  Laterality: N/A;   ENDOSCOPIC RETROGRADE CHOLANGIOPANCREATOGRAPHY (ERCP) WITH PROPOFOL N/A 04/07/2018   Procedure: ENDOSCOPIC RETROGRADE CHOLANGIOPANCREATOGRAPHY (ERCP) WITH PROPOFOL;  Surgeon: Rachael Fee, MD;  Location: [facility];  Service: Endoscopy;  Laterality: N/A;   ESOPHAGOGASTRODUODENOSCOPY (EGD) WITH PROPOFOL N/A 08/06/2019   Procedure: ESOPHAGOGASTRODUODENOSCOPY (EGD) WITH PROPOFOL;  Surgeon: Iva Boop, MD;  Location: [facility];  Service: Gastroenterology;  Laterality: N/A;   HAMMER TOE SURGERY     HEMORRHOID SURGERY     Hyperplastic colon polyps, removed  2007   By Dr. Evette Cristal   JOINT REPLACEMENT  2011   right knee   OOPHORECTOMY     REMOVAL OF STONES  04/07/2018   Procedure: REMOVAL OF STONES;  Surgeon: Rachael Fee, MD;  Location: [facility];  Service: Endoscopy;;   ROTATOR CUFF REPAIR  2004   left (Dr. Cleophas Dunker)   SPHINCTEROTOMY  04/07/2018   Procedure: Dennison Mascot;  Surgeon: Rachael Fee, MD;  Location: [facility];  Service: Endoscopy;;   TOTAL HIP ARTHROPLASTY Right  11/26/2016   Procedure: RIGHT TOTAL HIP ARTHROPLASTY;  Surgeon: Valeria Batman, MD;  Location: [facility] OR;  Service: Orthopedics;  Laterality: Right;   TOTAL KNEE ARTHROPLASTY Right    TOTAL KNEE ARTHROPLASTY Left 07/26/2019   Procedure: TOTAL KNEE ARTHROPLASTY;  Surgeon: Ollen Gross, MD;  Location: [facility];  Service: Orthopedics;  Laterality: Left;     REVIEW OF SYSTEMS:   Review of Systems  Constitutional: Stable fatigue. Negative for appetite change, chills, fever and unexpected weight change.  HENT: Negative for mouth sores, nosebleeds, sore throat and trouble swallowing.   Eyes: Negative for eye problems and icterus.  Respiratory: Positive for stable dyspnea on exertion. Negative for cough, hemoptysis, and wheezing.   Cardiovascular: Negative for chest pain and leg swelling.  Gastrointestinal: Negative for abdominal pain, constipation, diarrhea, nausea and vomiting.  Genitourinary: Negative for bladder incontinence, difficulty urinating, dysuria, frequency and hematuria.  Musculoskeletal: Negative for back pain, gait problem, neck pain and neck stiffness.  Skin: Negative for itching and rash.  Neurological: Negative for dizziness, extremity weakness, gait problem, headaches, light-headedness and seizures.  Hematological: Negative for adenopathy. Does not bruise/bleed easily.  Psychiatric/Behavioral: Negative for confusion, depression and sleep disturbance. The patient is not nervous/anxious.     PHYSICAL EXAMINATION:  Blood pressure (!) 145/53, pulse 82, temperature 98.4 F (36.9 C), temperature source Temporal, resp. rate 18, weight 161 lb 9.6 oz (73.3 kg), SpO2 96%.  ECOG PERFORMANCE STATUS: 1  Physical Exam  Constitutional: Oriented to person, place, and time and well-developed, well-nourished, and in no distress.  HENT:  Head: Normocephalic and atraumatic.  Mouth/Throat: Oropharynx is clear and moist. No oropharyngeal exudate.  Eyes: Conjunctivae are normal. Right eye  exhibits no discharge. Left eye exhibits no discharge. No scleral icterus.  Neck: Normal range of motion. Neck supple.  Cardiovascular: Normal rate, regular rhythm, murmur noted and intact distal pulses.   Pulmonary/Chest: Effort normal and breath sounds normal. No respiratory distress. No wheezes. No rales.  Abdominal: Soft. Bowel sounds are normal. Exhibits no distension and no mass. There is no tenderness.  Musculoskeletal: Normal range of motion. Exhibits no edema.  Lymphadenopathy:    No cervical adenopathy.  Neurological: Alert and oriented to person, place, and time. Exhibits normal muscle tone. Gait normal. Coordination normal.  Skin: Skin is warm and dry. No rash noted. Not diaphoretic. No erythema. No pallor.  Psychiatric: Mood, memory and judgment normal.  Vitals reviewed.  LABORATORY DATA: Lab Results  Component Value Date   WBC 9.5 11/13/2023   HGB 9.4 (L) 11/13/2023   HCT 28.3 (L) 11/13/2023   MCV 91.6 11/13/2023   PLT 272 11/13/2023      Chemistry      Component Value Date/Time   NA 140 11/13/2023 1322   NA 143 02/10/2018 1126   NA 141 07/12/2015 1441   K 4.7 11/13/2023 1322   K 4.5 07/12/2015 1441   CL 108 11/13/2023 1322   CO2 27 11/13/2023 1322   CO2 21 (L) 07/12/2015 1441   BUN 24 (H) 11/13/2023 1322   BUN 26 02/10/2018 1126   BUN 33.3 (H) 07/12/2015 1441   CREATININE 1.16 (H) 11/13/2023 1322   CREATININE 1.7 (H) 07/12/2015 1441   GLU 158 12/02/2016 0000      Component Value Date/Time   CALCIUM 9.1 11/13/2023 1322   CALCIUM 8.9 07/12/2015 1441   ALKPHOS 65 11/13/2023 1322   ALKPHOS 79 07/12/2015 1441   AST 16 11/13/2023 1322   AST 15 07/12/2015 1441   ALT 12 11/13/2023 1322   ALT 12 07/12/2015 1441   BILITOT 1.8 (H) 11/13/2023 1322   BILITOT 1.64 (H) 07/12/2015 1441       RADIOGRAPHIC STUDIES:  No results found.   ASSESSMENT/PLAN:  Is a very pleasant 89 year old Caucasian female with chronic anemia likely secondary to mild underlying  hemolysis of unknown etiology.  Dr. Arbutus Ped recommended that the patient be on 10 mg of prednisone daily to reduce hemolysis.  The patient has not taken her prednisone in a few weeks. The patient was seen with Dr. Arbutus Ped.  The patient's hemoglobin is stable a low at 9.4.  Her bilirubin is stable at 1.8. Her reticulocyte count is elevated.   Dr. Arbutus Ped would like the patient to resume taking prednisone 10 mg p.o. daily.  She was advised to take this first thing in the morning to help with the insomnia.  She was  advised to please pick this up from the pharmacy.  We also discussed that when she runs out of taking this medication, she should fill the refill and continue taking this until her next appointment.  We will see her back for follow-up visit in 4 weeks for evaluation repeat blood work.  The patient was advised to call immediately if she has any concerning symptoms in the interval. The patient voices understanding of current disease status and treatment options and is in agreement with the current care plan. All questions were answered. The patient knows to call the clinic with any problems, questions or concerns. We can certainly see the patient much sooner if necessary   No orders of the defined types were placed in this encounter.    Syler Norcia L Tanesha Arambula, PA-C 11/13/23  ADDENDUM: Hematology/Oncology Attending: I had a face-to-face encounter with the patient today.  I reviewed her record, lab and recommended her care plan.  This is a very pleasant 89 years old white female with chronic anemia of unclear etiology with suspicious hemolysis with low haptoglobin.  The patient is feeling fine today with no concerning complaints except for mild fatigue.  She was given prescription for prednisone 5 mg p.o. daily that she took for few weeks but she ran out of her medication 2 weeks ago and she did not refill her medication as prescribed. Repeat CBC today showed persistent anemia with  hemoglobin of 9.4 and hematocrit 28.3%.  Her LDH is 213. I recommended for the patient to resume her treatment with prednisone 10 mg p.o. daily.  The patient does not tolerate higher doses of steroid secondary to hyperactivity and lack of sleep. I will see her back for follow-up visit in 1 months for evaluation and repeat blood work. She was advised to call immediately if she has any other concerning symptoms in the interval. The total time spent in the appointment was 20 minutes. Disclaimer: This note was dictated with voice recognition software. Similar sounding words can inadvertently be transcribed and may be missed upon review. Lajuana Matte, MD

## 2023-11-13 ENCOUNTER — Inpatient Hospital Stay: Payer: Medicare Other | Admitting: Physician Assistant

## 2023-11-13 ENCOUNTER — Inpatient Hospital Stay: Payer: Medicare Other | Attending: Internal Medicine

## 2023-11-13 VITALS — BP 145/53 | HR 82 | Temp 98.4°F | Resp 18 | Wt 161.6 lb

## 2023-11-13 DIAGNOSIS — D649 Anemia, unspecified: Secondary | ICD-10-CM | POA: Insufficient documentation

## 2023-11-13 DIAGNOSIS — Z7952 Long term (current) use of systemic steroids: Secondary | ICD-10-CM | POA: Diagnosis not present

## 2023-11-13 DIAGNOSIS — G4700 Insomnia, unspecified: Secondary | ICD-10-CM | POA: Insufficient documentation

## 2023-11-13 DIAGNOSIS — D591 Autoimmune hemolytic anemia, unspecified: Secondary | ICD-10-CM

## 2023-11-13 LAB — CBC WITH DIFFERENTIAL (CANCER CENTER ONLY)
Abs Immature Granulocytes: 0.18 10*3/uL — ABNORMAL HIGH (ref 0.00–0.07)
Basophils Absolute: 0 10*3/uL (ref 0.0–0.1)
Basophils Relative: 0 %
Eosinophils Absolute: 0.1 10*3/uL (ref 0.0–0.5)
Eosinophils Relative: 1 %
HCT: 28.3 % — ABNORMAL LOW (ref 36.0–46.0)
Hemoglobin: 9.4 g/dL — ABNORMAL LOW (ref 12.0–15.0)
Immature Granulocytes: 2 %
Lymphocytes Relative: 14 %
Lymphs Abs: 1.3 10*3/uL (ref 0.7–4.0)
MCH: 30.4 pg (ref 26.0–34.0)
MCHC: 33.2 g/dL (ref 30.0–36.0)
MCV: 91.6 fL (ref 80.0–100.0)
Monocytes Absolute: 0.7 10*3/uL (ref 0.1–1.0)
Monocytes Relative: 8 %
Neutro Abs: 7.2 10*3/uL (ref 1.7–7.7)
Neutrophils Relative %: 75 %
Platelet Count: 272 10*3/uL (ref 150–400)
RBC: 3.09 MIL/uL — ABNORMAL LOW (ref 3.87–5.11)
RDW: 21 % — ABNORMAL HIGH (ref 11.5–15.5)
WBC Count: 9.5 10*3/uL (ref 4.0–10.5)
nRBC: 0 % (ref 0.0–0.2)

## 2023-11-13 LAB — RETICULOCYTES
Immature Retic Fract: 16.5 % — ABNORMAL HIGH (ref 2.3–15.9)
RBC.: 3.06 MIL/uL — ABNORMAL LOW (ref 3.87–5.11)
Retic Count, Absolute: 252.8 10*3/uL — ABNORMAL HIGH (ref 19.0–186.0)
Retic Ct Pct: 8.3 % — ABNORMAL HIGH (ref 0.4–3.1)

## 2023-11-13 LAB — CMP (CANCER CENTER ONLY)
ALT: 12 U/L (ref 0–44)
AST: 16 U/L (ref 15–41)
Albumin: 4.3 g/dL (ref 3.5–5.0)
Alkaline Phosphatase: 65 U/L (ref 38–126)
Anion gap: 5 (ref 5–15)
BUN: 24 mg/dL — ABNORMAL HIGH (ref 8–23)
CO2: 27 mmol/L (ref 22–32)
Calcium: 9.1 mg/dL (ref 8.9–10.3)
Chloride: 108 mmol/L (ref 98–111)
Creatinine: 1.16 mg/dL — ABNORMAL HIGH (ref 0.44–1.00)
GFR, Estimated: 45 mL/min — ABNORMAL LOW (ref >60)
Glucose, Bld: 121 mg/dL — ABNORMAL HIGH (ref 70–99)
Potassium: 4.7 mmol/L (ref 3.5–5.1)
Sodium: 140 mmol/L (ref 135–145)
Total Bilirubin: 1.8 mg/dL — ABNORMAL HIGH (ref <1.2)
Total Protein: 6.2 g/dL — ABNORMAL LOW (ref 6.5–8.1)

## 2023-11-13 LAB — LACTATE DEHYDROGENASE: LDH: 213 U/L — ABNORMAL HIGH (ref 98–192)

## 2023-11-13 MED ORDER — PREDNISONE 10 MG PO TABS: 10.0000 mg | ORAL_TABLET | Freq: Every day | ORAL | 1 refills | Status: DC

## 2023-11-14 LAB — ERYTHROPOIETIN: Erythropoietin: 71.3 m[IU]/mL — ABNORMAL HIGH (ref 2.6–18.5)

## 2023-11-14 LAB — HAPTOGLOBIN: Haptoglobin: 16 mg/dL — ABNORMAL LOW (ref 41–333)

## 2023-12-18 ENCOUNTER — Ambulatory Visit: Payer: Medicare Other | Admitting: Physician Assistant

## 2023-12-18 DIAGNOSIS — M7062 Trochanteric bursitis, left hip: Secondary | ICD-10-CM

## 2023-12-18 MED ORDER — LIDOCAINE HCL 1 % IJ SOLN
3.0000 mL | INTRAMUSCULAR | Status: AC | PRN
  Administered 2023-12-18: 3 mL

## 2023-12-18 MED ORDER — METHYLPREDNISOLONE ACETATE 40 MG/ML IJ SUSP
40.0000 mg | INTRAMUSCULAR | Status: AC | PRN
  Administered 2023-12-18: 40 mg via INTRA_ARTICULAR

## 2023-12-18 NOTE — Progress Notes (Signed)
Office Visit Note   Patient: Claudia Lara           Date of Birth: 1932/10/21           MRN: [id number] Visit Date: 12/18/2023              Requested by: Etta Grandchild, MD [address] [place],  [place] [address] PCP: Etta Grandchild, MD   Assessment & Plan: Visit Diagnoses:  1. Trochanteric bursitis, left hip     Plan: Patient is a pleasant 89 year old woman who is a former patient of Dr. Cleophas Dunker.  She has had right hip replacement with her.  She comes into the clinic today complaining of left lateral and posterior hip pain.  Denies any groin pain.  Denies any injuries.  Notices it is more painful when she sleeping on her side at night.  Denies any radicular symptoms.  Although her pain goes slightly into the buttock she did have an area of tenderness over the trochanteric bursa.  Will go and inject this and she how she does if she is no better we will follow-up  Follow-Up Instructions: Return if symptoms worsen or fail to improve.   Orders:  No orders of the defined types were placed in this encounter.  No orders of the defined types were placed in this encounter.     Procedures: Large Joint Inj: L greater trochanter on 12/18/2023 3:43 PM Indications: pain and diagnostic evaluation Details: 22 G 1.5 in needle, lateral approach  Arthrogram: No  Medications: 3 mL lidocaine 1 %; 40 mg methylPREDNISolone acetate 40 MG/ML Outcome: tolerated well, no immediate complications Procedure, treatment alternatives, risks and benefits explained, specific risks discussed. Consent was given by the patient.       Clinical Data: No additional findings.   Subjective: Chief Complaint  Patient presents with   Left Hip - Pain    HPI pleasant 89 year old woman presents today with left lateral hip pain denies any falls or injuries.  Has not really sought any treatment but bothers her when she lays on that side or getting up out of a chair denies any numbing or tingling loss of  bowel or bladder control  Review of Systems  All other systems reviewed and are negative.    Objective: Vital Signs: There were no vitals taken for this visit.  Physical Exam Constitutional:      Appearance: Normal appearance.  Pulmonary:     Effort: Pulmonary effort is normal.  Skin:    General: Skin is warm and dry.  Neurological:     General: No focal deficit present.     Mental Status: She is alert and oriented to person, place, and time.  Psychiatric:        Mood and Affect: Mood normal.        Behavior: Behavior normal.     Ortho Exam Examination of her left hip she is neurovascularly intact she has no pain in her groin with rotation of the hip.  She has good strength with dorsiflexion plantarflexion of her ankles and legs.  Negative straight leg raise.  No tenderness over her lumbar spine.  She is focally tender to palpation over the trochanteric bursa Specialty Comments:  No specialty comments available.  Imaging: No results found.   PMFS History: Patient Active Problem List   Diagnosis Date Noted   Trochanteric bursitis, left hip 12/18/2023   DOE (dyspnea on exertion) 08/05/2023   Chronic hyperglycemia 08/05/2023   Psychophysiological insomnia  02/13/2023   Encounter for general adult medical examination with abnormal findings 07/22/2021   Recurrent UTI (urinary tract infection) 07/13/2021   Estrogen deficiency 01/22/2021   OAB (overactive bladder) 11/01/2020   Carpal tunnel syndrome, left upper limb 04/05/2020   Iron deficiency anemia due to chronic blood loss 09/08/2019   Interstitial pulmonary disease (HCC) 09/08/2019   OA (osteoarthritis) of knee 07/26/2019   Lung nodule 06/24/2019   DDD (degenerative disc disease), cervical 04/16/2019   Zinc deficiency 10/03/2017   Spondylosis, lumbar, with myelopathy 04/09/2017   GERD (gastroesophageal reflux disease) 08/03/2015   Hemolytic anemia (HCC) 07/12/2015   Chronic venous insufficiency 08/10/2014    Aortic stenosis, mild 06/29/2014   Left ventricular diastolic dysfunction, NYHA class 1 06/29/2014   Senile osteopenia 04/21/2014   Hyperlipidemia with target LDL less than 130 04/21/2014   Kidney disease, chronic, stage III (GFR 30-59 ml/min) (HCC) 04/21/2014   Carotid artery stenosis without cerebral infarction 01/16/2010   Essential hypertension 10/19/2007   Past Medical History:  Diagnosis Date   ANEMIA 08/22/2009   Aortic stenosis    AR (aortic regurgitation) 01/2019   Mild to Moderate, Noted on ECHO   CAROTID ARTERY STENOSIS 01/16/2010   Complication of anesthesia    very hard to wake up from surgery   DYSPNEA ON EXERTION 11/16/2007   Gallstones    GEN OSTEOARTHROSIS INVOLVING MULTIPLE SITES 11/11/2008   GERD (gastroesophageal reflux disease)    Grade I diastolic dysfunction 01/2018   Noted on ECHO   Heart murmur    HEMORRHOIDS, INTERNAL    surgery was in the 90's (per patient)   History of blood transfusion    after hip and knee replacements   NECK PAIN, ACUTE 12/28/2009   PERIPHERAL EDEMA 10/06/2007   PONV (postoperative nausea and vomiting)    Pulmonary nodule 06/18/2019   noted on CT Chest    PVD 10/06/2007   Unspecified essential hypertension 10/19/2007    Family History  Problem Relation Age of Onset   Ovarian cancer Mother    Cancer Mother    Leukemia Father    Lung cancer Father    Cancer Father    Cancer Brother    Diabetes Neg Hx    Heart disease Neg Hx    Hypertension Neg Hx     Past Surgical History:  Procedure Laterality Date   ABDOMINAL HYSTERECTOMY     BIOPSY  08/06/2019   Procedure: BIOPSY;  Surgeon: Iva Boop, MD;  Location: [facility];  Service: Gastroenterology;;   Arbutus Leas     bilat   CARPAL TUNNEL RELEASE     CHOLECYSTECTOMY  01/29/2016   at [facility]   CHOLECYSTECTOMY     ENDOSCOPIC RETROGRADE CHOLANGIOPANCREATOGRAPHY (ERCP) WITH PROPOFOL N/A 12/01/2015   Procedure: ENDOSCOPIC RETROGRADE  CHOLANGIOPANCREATOGRAPHY (ERCP) WITH PROPOFOL;  Surgeon: Rachael Fee, MD;  Location: [facility];  Service: Endoscopy;  Laterality: N/A;   ENDOSCOPIC RETROGRADE CHOLANGIOPANCREATOGRAPHY (ERCP) WITH PROPOFOL N/A 02/01/2016   Procedure: ENDOSCOPIC RETROGRADE CHOLANGIOPANCREATOGRAPHY (ERCP) WITH PROPOFOL;  Surgeon: Rachael Fee, MD;  Location: [facility];  Service: Endoscopy;  Laterality: N/A;   ENDOSCOPIC RETROGRADE CHOLANGIOPANCREATOGRAPHY (ERCP) WITH PROPOFOL N/A 04/07/2018   Procedure: ENDOSCOPIC RETROGRADE CHOLANGIOPANCREATOGRAPHY (ERCP) WITH PROPOFOL;  Surgeon: Rachael Fee, MD;  Location: [facility];  Service: Endoscopy;  Laterality: N/A;   ESOPHAGOGASTRODUODENOSCOPY (EGD) WITH PROPOFOL N/A 08/06/2019   Procedure: ESOPHAGOGASTRODUODENOSCOPY (EGD) WITH PROPOFOL;  Surgeon: Iva Boop, MD;  Location: [facility];  Service: Gastroenterology;  Laterality: N/A;  HAMMER TOE SURGERY     HEMORRHOID SURGERY     Hyperplastic colon polyps, removed  2007   By Dr. Evette Cristal   JOINT REPLACEMENT  2011   right knee   OOPHORECTOMY     REMOVAL OF STONES  04/07/2018   Procedure: REMOVAL OF STONES;  Surgeon: Rachael Fee, MD;  Location: [facility];  Service: Endoscopy;;   ROTATOR CUFF REPAIR  2004   left (Dr. Cleophas Dunker)   SPHINCTEROTOMY  04/07/2018   Procedure: Dennison Mascot;  Surgeon: Rachael Fee, MD;  Location: [facility];  Service: Endoscopy;;   TOTAL HIP ARTHROPLASTY Right 11/26/2016   Procedure: RIGHT TOTAL HIP ARTHROPLASTY;  Surgeon: Valeria Batman, MD;  Location: [facility];  Service: Orthopedics;  Laterality: Right;   TOTAL KNEE ARTHROPLASTY Right    TOTAL KNEE ARTHROPLASTY Left 07/26/2019   Procedure: TOTAL KNEE ARTHROPLASTY;  Surgeon: Ollen Gross, MD;  Location: [facility];  Service: Orthopedics;  Laterality: Left;    Social History   Occupational History   Occupation: Producer, television/film/video: RETIRED    Comment: retired  Tobacco Use   Smoking status: Never    Smokeless tobacco: Never  Vaping Use   Vaping status: Never Used  Substance and Sexual Activity   Alcohol use: No   Drug use: No   Sexual activity: Not Currently    Birth control/protection: Surgical

## 2023-12-22 ENCOUNTER — Telehealth: Payer: Self-pay | Admitting: Physician Assistant

## 2023-12-22 ENCOUNTER — Other Ambulatory Visit: Payer: Self-pay | Admitting: Physician Assistant

## 2023-12-22 DIAGNOSIS — D591 Autoimmune hemolytic anemia, unspecified: Secondary | ICD-10-CM

## 2023-12-22 DIAGNOSIS — D500 Iron deficiency anemia secondary to blood loss (chronic): Secondary | ICD-10-CM

## 2023-12-22 NOTE — Telephone Encounter (Signed)
Pt called requesting a call from PA Persons. Pt states injection did not work and need to discuss next plan of care. Pt had injection 12/18/23. Pt phone number is [phone].

## 2023-12-23 ENCOUNTER — Inpatient Hospital Stay: Payer: Medicare Other

## 2023-12-23 ENCOUNTER — Inpatient Hospital Stay: Payer: Medicare Other | Attending: Internal Medicine | Admitting: Internal Medicine

## 2023-12-23 VITALS — BP 179/65 | HR 83 | Temp 98.7°F | Resp 15 | Ht 60.35 in | Wt 165.5 lb

## 2023-12-23 DIAGNOSIS — D591 Autoimmune hemolytic anemia, unspecified: Secondary | ICD-10-CM

## 2023-12-23 DIAGNOSIS — G4700 Insomnia, unspecified: Secondary | ICD-10-CM | POA: Diagnosis not present

## 2023-12-23 DIAGNOSIS — D599 Acquired hemolytic anemia, unspecified: Secondary | ICD-10-CM | POA: Diagnosis not present

## 2023-12-23 DIAGNOSIS — D589 Hereditary hemolytic anemia, unspecified: Secondary | ICD-10-CM | POA: Insufficient documentation

## 2023-12-23 LAB — CBC WITH DIFFERENTIAL (CANCER CENTER ONLY)
Abs Immature Granulocytes: 0.45 10*3/uL — ABNORMAL HIGH (ref 0.00–0.07)
Basophils Absolute: 0.1 10*3/uL (ref 0.0–0.1)
Basophils Relative: 0 %
Eosinophils Absolute: 0 10*3/uL (ref 0.0–0.5)
Eosinophils Relative: 0 %
HCT: 34.2 % — ABNORMAL LOW (ref 36.0–46.0)
Hemoglobin: 11.7 g/dL — ABNORMAL LOW (ref 12.0–15.0)
Immature Granulocytes: 3 %
Lymphocytes Relative: 4 %
Lymphs Abs: 0.7 10*3/uL (ref 0.7–4.0)
MCH: 31.1 pg (ref 26.0–34.0)
MCHC: 34.2 g/dL (ref 30.0–36.0)
MCV: 91 fL (ref 80.0–100.0)
Monocytes Absolute: 0.4 10*3/uL (ref 0.1–1.0)
Monocytes Relative: 2 %
Neutro Abs: 13.6 10*3/uL — ABNORMAL HIGH (ref 1.7–7.7)
Neutrophils Relative %: 91 %
Platelet Count: 235 10*3/uL (ref 150–400)
RBC: 3.76 MIL/uL — ABNORMAL LOW (ref 3.87–5.11)
RDW: 20.3 % — ABNORMAL HIGH (ref 11.5–15.5)
WBC Count: 15.1 10*3/uL — ABNORMAL HIGH (ref 4.0–10.5)
nRBC: 0.1 % (ref 0.0–0.2)

## 2023-12-23 LAB — CMP (CANCER CENTER ONLY)
ALT: 24 U/L (ref 0–44)
AST: 16 U/L (ref 15–41)
Albumin: 4.4 g/dL (ref 3.5–5.0)
Alkaline Phosphatase: 54 U/L (ref 38–126)
Anion gap: 6 (ref 5–15)
BUN: 29 mg/dL — ABNORMAL HIGH (ref 8–23)
CO2: 26 mmol/L (ref 22–32)
Calcium: 9.7 mg/dL (ref 8.9–10.3)
Chloride: 107 mmol/L (ref 98–111)
Creatinine: 1.17 mg/dL — ABNORMAL HIGH (ref 0.44–1.00)
GFR, Estimated: 45 mL/min — ABNORMAL LOW (ref >60)
Glucose, Bld: 170 mg/dL — ABNORMAL HIGH (ref 70–99)
Potassium: 4.6 mmol/L (ref 3.5–5.1)
Sodium: 139 mmol/L (ref 135–145)
Total Bilirubin: 1.7 mg/dL — ABNORMAL HIGH (ref 0.0–1.2)
Total Protein: 6.4 g/dL — ABNORMAL LOW (ref 6.5–8.1)

## 2023-12-23 LAB — LACTATE DEHYDROGENASE: LDH: 218 U/L — ABNORMAL HIGH (ref 98–192)

## 2023-12-23 NOTE — Progress Notes (Signed)
[name] [phone]   [phone]  OFFICE PROGRESS NOTE  Claudia Grandchild, MD [address] [place] [address]  DIAGNOSIS: Chronic anemia of unclear etiology questionable for hemolytic anemia with low haptoglobin  PRIOR THERAPY: None  CURRENT THERAPY: Prednisone 10 mg p.o. daily start October 03, 2023  INTERVAL HISTORY: Claudia Lara 89 y.o. female returns to the clinic today for follow-up visit.  Discussed the use of AI scribe software for clinical note transcription with the patient, who gave verbal consent to proceed.  History of Present Illness   The patient is an 89 year old with chronic anemia who presents for follow-up.  Chronic anemia is suspected to be due to hemolysis. She has been on prednisone 10 mg daily, which has led to an improvement in hemoglobin levels from 9.4 on December 19 to 11.7 today. She reports adherence to the medication regimen.  She notes difficulty sleeping, which she attributes to a recent steroid shot. She does not currently use any sleep aids, although she has tried regular Tylenol without benefit. She takes her prednisone in the morning.      MEDICAL HISTORY: Past Medical History:  Diagnosis Date   ANEMIA 08/22/2009   Aortic stenosis    AR (aortic regurgitation) 01/2019   Mild to Moderate, Noted on ECHO   CAROTID ARTERY STENOSIS 01/16/2010   Complication of anesthesia    very hard to wake up from surgery   DYSPNEA ON EXERTION 11/16/2007   Gallstones    GEN OSTEOARTHROSIS INVOLVING MULTIPLE SITES 11/11/2008   GERD (gastroesophageal reflux disease)    Grade I diastolic dysfunction 01/2018   Noted on ECHO   Heart murmur    HEMORRHOIDS, INTERNAL    surgery was in the 90's (per patient)   History of blood transfusion    after hip and knee replacements   NECK PAIN, ACUTE 12/28/2009   PERIPHERAL EDEMA 10/06/2007   PONV (postoperative nausea and vomiting)    Pulmonary nodule 06/18/2019   noted  on CT Chest    PVD 10/06/2007   Unspecified essential hypertension 10/19/2007    ALLERGIES:  is allergic to sulfonamide derivatives.  MEDICATIONS:  Current Outpatient Medications  Medication Sig Dispense Refill   Calcium Carb-Cholecalciferol (CALCIUM CARBONATE-VITAMIN D3 PO) Take 1 tablet by mouth daily.      carvedilol (COREG) 12.5 MG tablet TAKE 1 TABLET BY MOUTH TWICE A DAY WITH FOOD 180 tablet 1   cholecalciferol (VITAMIN D) 1000 units tablet Take 1,000 Units by mouth daily.     ferrous sulfate 325 (65 FE) MG tablet Take 325 mg by mouth daily with breakfast.     losartan (COZAAR) 25 MG tablet Take 1 tablet (25 mg total) by mouth daily. 90 tablet 3   methenamine (HIPREX) 1 g tablet TAKE 1 TABLET (1 G TOTAL) BY MOUTH 2 (TWO) TIMES DAILY WITH A MEAL. 180 tablet 1   Multiple Vitamin (MULTIVITAMIN WITH MINERALS) TABS tablet Take 1 tablet by mouth daily.     Multiple Vitamins-Minerals (ZINC PO) Take by mouth.     predniSONE (DELTASONE) 10 MG tablet Take 1 tablet (10 mg total) by mouth daily with breakfast. 30 tablet 1   torsemide (DEMADEX) 20 MG tablet TAKE 1 TABLET BY MOUTH EVERY DAY 90 tablet 1   vitamin C (ASCORBIC ACID) 500 MG tablet Take 500 mg by mouth daily.     No current facility-administered medications for this visit.    SURGICAL HISTORY:  Past Surgical History:  Procedure Laterality Date   ABDOMINAL HYSTERECTOMY     BIOPSY  08/06/2019   Procedure: BIOPSY;  Surgeon: Iva Boop, MD;  Location: [facility];  Service: Gastroenterology;;   BUNIONECTOMY     bilat   CARPAL TUNNEL RELEASE     CHOLECYSTECTOMY  01/29/2016   at [facility]   CHOLECYSTECTOMY     ENDOSCOPIC RETROGRADE CHOLANGIOPANCREATOGRAPHY (ERCP) WITH PROPOFOL N/A 12/01/2015   Procedure: ENDOSCOPIC RETROGRADE CHOLANGIOPANCREATOGRAPHY (ERCP) WITH PROPOFOL;  Surgeon: Rachael Fee, MD;  Location: [facility];  Service: Endoscopy;  Laterality: N/A;   ENDOSCOPIC RETROGRADE  CHOLANGIOPANCREATOGRAPHY (ERCP) WITH PROPOFOL N/A 02/01/2016   Procedure: ENDOSCOPIC RETROGRADE CHOLANGIOPANCREATOGRAPHY (ERCP) WITH PROPOFOL;  Surgeon: Rachael Fee, MD;  Location: [facility];  Service: Endoscopy;  Laterality: N/A;   ENDOSCOPIC RETROGRADE CHOLANGIOPANCREATOGRAPHY (ERCP) WITH PROPOFOL N/A 04/07/2018   Procedure: ENDOSCOPIC RETROGRADE CHOLANGIOPANCREATOGRAPHY (ERCP) WITH PROPOFOL;  Surgeon: Rachael Fee, MD;  Location: [facility];  Service: Endoscopy;  Laterality: N/A;   ESOPHAGOGASTRODUODENOSCOPY (EGD) WITH PROPOFOL N/A 08/06/2019   Procedure: ESOPHAGOGASTRODUODENOSCOPY (EGD) WITH PROPOFOL;  Surgeon: Iva Boop, MD;  Location: [facility];  Service: Gastroenterology;  Laterality: N/A;   HAMMER TOE SURGERY     HEMORRHOID SURGERY     Hyperplastic colon polyps, removed  2007   By Dr. Evette Cristal   JOINT REPLACEMENT  2011   right knee   OOPHORECTOMY     REMOVAL OF STONES  04/07/2018   Procedure: REMOVAL OF STONES;  Surgeon: Rachael Fee, MD;  Location: [facility];  Service: Endoscopy;;   ROTATOR CUFF REPAIR  2004   left (Dr. Cleophas Dunker)   SPHINCTEROTOMY  04/07/2018   Procedure: Dennison Mascot;  Surgeon: Rachael Fee, MD;  Location: [facility];  Service: Endoscopy;;   TOTAL HIP ARTHROPLASTY Right 11/26/2016   Procedure: RIGHT TOTAL HIP ARTHROPLASTY;  Surgeon: Valeria Batman, MD;  Location: [facility] OR;  Service: Orthopedics;  Laterality: Right;   TOTAL KNEE ARTHROPLASTY Right    TOTAL KNEE ARTHROPLASTY Left 07/26/2019   Procedure: TOTAL KNEE ARTHROPLASTY;  Surgeon: Ollen Gross, MD;  Location: [facility];  Service: Orthopedics;  Laterality: Left;     REVIEW OF SYSTEMS:  A comprehensive review of systems was negative except for: Constitutional: positive for fatigue Behavioral/Psych: positive for sleep disturbance   PHYSICAL EXAMINATION: General appearance: alert, cooperative, appears stated age, and no distress Head: Normocephalic, without obvious abnormality,  atraumatic Neck: no adenopathy, no JVD, supple, symmetrical, trachea midline, and thyroid not enlarged, symmetric, no tenderness/mass/nodules Lymph nodes: Cervical, supraclavicular, and axillary nodes normal. Resp: clear to auscultation bilaterally Back: symmetric, no curvature. ROM normal. No CVA tenderness. Cardio: regular rate and rhythm, S1, S2 normal, no murmur, click, rub or gallop GI: soft, non-tender; bowel sounds normal; no masses,  no organomegaly Extremities: extremities normal, atraumatic, no cyanosis or edema  ECOG PERFORMANCE STATUS: 1 - Symptomatic but completely ambulatory  Blood pressure (!) 179/65, pulse 83, temperature 98.7 F (37.1 C), temperature source Temporal, resp. rate 15, height 5' 0.35" (1.533 m), weight 165 lb 8 oz (75.1 kg), SpO2 98%.  LABORATORY DATA: Lab Results  Component Value Date   WBC 15.1 (H) 12/23/2023   HGB 11.7 (L) 12/23/2023   HCT 34.2 (L) 12/23/2023   MCV 91.0 12/23/2023   PLT 235 12/23/2023      Chemistry      Component Value Date/Time   NA 139 12/23/2023 1440   NA 143 02/10/2018 1126   NA 141 07/12/2015 1441  K 4.6 12/23/2023 1440   K 4.5 07/12/2015 1441   CL 107 12/23/2023 1440   CO2 26 12/23/2023 1440   CO2 21 (L) 07/12/2015 1441   BUN 29 (H) 12/23/2023 1440   BUN 26 02/10/2018 1126   BUN 33.3 (H) 07/12/2015 1441   CREATININE 1.17 (H) 12/23/2023 1440   CREATININE 1.7 (H) 07/12/2015 1441   GLU 158 12/02/2016 0000      Component Value Date/Time   CALCIUM 9.7 12/23/2023 1440   CALCIUM 8.9 07/12/2015 1441   ALKPHOS 54 12/23/2023 1440   ALKPHOS 79 07/12/2015 1441   AST 16 12/23/2023 1440   AST 15 07/12/2015 1441   ALT 24 12/23/2023 1440   ALT 12 07/12/2015 1441   BILITOT 1.7 (H) 12/23/2023 1440   BILITOT 1.64 (H) 07/12/2015 1441       RADIOGRAPHIC STUDIES: No results found.  ASSESSMENT AND PLAN: This is a very pleasant 89 years old white female with chronic anemia likely secondary to mild underlying hemolysis of  unknown etiology.    Chronic Anemia with Hemolysis Chronic anemia likely due to hemolysis, evidenced by improved hemoglobin levels from 9.4 on December 19 to 11.7 today. Currently on prednisone 10 mg daily, contributing to improvement. Elevated white blood cell count (15.1) and blood glucose levels (170) due to steroid therapy. Discussed the importance of continuing prednisone without interruption and monitoring blood glucose levels. Informed about the expected outcomes and potential side effects of steroid use, including elevated blood glucose and white blood cell count. - Continue prednisone 10 mg daily - Monitor blood glucose levels - Repeat blood work in one month - Schedule follow-up appointment in one month - Call for prednisone refill when supply is low  Insomnia Reports difficulty sleeping, likely exacerbated by steroid use. Currently does not use any sleep aids. Discussed potential benefits of using over-the-counter sleep aids such as Tylenol PM or melatonin. - Recommend trying Tylenol PM or melatonin for sleep aid.   The patient was advised to call immediately if she has any other concerning symptoms in the interval.  The patient voices understanding of current disease status and treatment options and is in agreement with the current care plan.  All questions were answered. The patient knows to call the clinic with any problems, questions or concerns. We can certainly see the patient much sooner if necessary. The total time spent in the appointment was 20 minutes.  Disclaimer: This note was dictated with voice recognition software. Similar sounding words can inadvertently be transcribed and may not be corrected upon review.

## 2023-12-24 ENCOUNTER — Telehealth: Payer: Self-pay | Admitting: Physician Assistant

## 2023-12-24 DIAGNOSIS — Z85828 Personal history of other malignant neoplasm of skin: Secondary | ICD-10-CM | POA: Diagnosis not present

## 2023-12-24 DIAGNOSIS — L570 Actinic keratosis: Secondary | ICD-10-CM | POA: Diagnosis not present

## 2023-12-24 DIAGNOSIS — X32XXXD Exposure to sunlight, subsequent encounter: Secondary | ICD-10-CM | POA: Diagnosis not present

## 2023-12-24 DIAGNOSIS — Z08 Encounter for follow-up examination after completed treatment for malignant neoplasm: Secondary | ICD-10-CM | POA: Diagnosis not present

## 2023-12-24 DIAGNOSIS — L918 Other hypertrophic disorders of the skin: Secondary | ICD-10-CM | POA: Diagnosis not present

## 2023-12-24 DIAGNOSIS — L821 Other seborrheic keratosis: Secondary | ICD-10-CM | POA: Diagnosis not present

## 2023-12-24 NOTE — Telephone Encounter (Signed)
Pt called stating she can not take another day of pain and asking for PA Chales Abrahams to call her back at [phone]

## 2023-12-25 ENCOUNTER — Encounter: Payer: Self-pay | Admitting: Physician Assistant

## 2023-12-25 ENCOUNTER — Other Ambulatory Visit (INDEPENDENT_AMBULATORY_CARE_PROVIDER_SITE_OTHER): Payer: Medicare Other

## 2023-12-25 ENCOUNTER — Ambulatory Visit: Payer: Medicare Other | Admitting: Physician Assistant

## 2023-12-25 DIAGNOSIS — M25552 Pain in left hip: Secondary | ICD-10-CM

## 2023-12-25 MED ORDER — TRAMADOL HCL 50 MG PO TABS: 50.0000 mg | ORAL_TABLET | Freq: Two times a day (BID) | ORAL | 0 refills | Status: DC | PRN

## 2023-12-25 NOTE — Progress Notes (Signed)
Office Visit Note   Patient: Claudia Lara           Date of Birth: August 09, 1932           MRN: [id number] Visit Date: 12/25/2023              Requested by: Etta Grandchild, MD [address] [place],  [place] [address] PCP: Etta Grandchild, MD  Chief Complaint  Patient presents with   Left Hip - Pain      HPI: Patient is a pleasant 89 year old woman who last saw me about a week ago.  She had isolated pain to her trochanteric bursa.  She did undergo a injection into the trochanteric bursa she reported that helped her quite a bit and numbed her the first night.  Now all of her pain is returned she ranks it as severe.  She denies any loss of bowel or bladder control  Assessment & Plan: Visit Diagnoses:  1. Pain in left hip     Plan: X-rays are reassuring.  She has absolutely no groin pain no radicular findings she is still focally tender over the trochanteric bursa.  She states that it actually is a little bit better today and is actually better when she is up and walking around but when she rolls over on her side at night is when its most painful.  Questioning whether this may have been a steroid flare as she did well and then got significantly worse and says she feels just slightly better today.  I am going to call her in just a few tramadol and have cautioned her about only taking these at night when she absolutely needs it.  Will also have her engage with physical therapy and follow-up with me in 2 weeks  Follow-Up Instructions: 2 weeks  Ortho Exam  Patient is alert, oriented, no adenopathy, well-dressed, normal affect, normal respiratory effort. Examination of her left hip no groin pain no pain with logrolling of her hip she is neurovascularly intact negative radicular findings she has good dorsiflexion plantarflexion extension flexion of her legs.  Still focally tender over the trochanteric bursa.  No redness or erythema at the injection site no cellulitis  Imaging: No  results found. No images are attached to the encounter.  Labs: Lab Results  Component Value Date   HGBA1C 4.5 (L) 08/05/2023   HGBA1C 4.8 06/08/2019   HGBA1C 5.2 03/05/2016   ESRSEDRATE 1 04/27/2018   ESRSEDRATE 4 02/14/2014   CRP 0.1 (L) 04/27/2018   REPTSTATUS 09/02/2019 FINAL 09/01/2019   CULT (A) 09/01/2019    <10,000 COLONIES/mL INSIGNIFICANT GROWTH Performed at [facility], [address], [place], [place] [address]    LABORGA KLEBSIELLA OXYTOCA 06/06/2016     Lab Results  Component Value Date   ALBUMIN 4.4 12/23/2023   ALBUMIN 4.3 11/13/2023   ALBUMIN 4.3 09/01/2023    Lab Results  Component Value Date   MG 2.3 09/02/2019   MG 2.3 09/01/2019   MG 2.4 08/06/2019   Lab Results  Component Value Date   VD25OH 85.01 07/08/2018   VD25OH 58 04/21/2014    No results found for: "PREALBUMIN"    Latest Ref Rng & Units 12/23/2023    2:40 PM 11/13/2023    1:23 PM 11/13/2023    1:22 PM  CBC EXTENDED  WBC 4.0 - 10.5 K/uL 15.1   9.5   RBC 3.87 - 5.11 MIL/uL 3.76  3.06  3.09   Hemoglobin 12.0 -  15.0 g/dL 16.1   9.4   HCT 09.6 - 46.0 % 34.2   28.3   Platelets 150 - 400 K/uL 235   272   NEUT# 1.7 - 7.7 K/uL 13.6   7.2   Lymph# 0.7 - 4.0 K/uL 0.7   1.3      There is no height or weight on file to calculate BMI.  Orders:  Orders Placed This Encounter  Procedures   XR HIP UNILAT W OR W/O PELVIS 1V LEFT   No orders of the defined types were placed in this encounter.    Procedures: No procedures performed  Clinical Data: No additional findings.  ROS:  All other systems negative, except as noted in the HPI. Review of Systems  Objective: Vital Signs: There were no vitals taken for this visit.  Specialty Comments:  No specialty comments available.  PMFS History: Patient Active Problem List   Diagnosis Date Noted   Trochanteric bursitis, left hip 12/18/2023   DOE (dyspnea on exertion) 08/05/2023   Chronic hyperglycemia 08/05/2023    Psychophysiological insomnia 02/13/2023   Encounter for general adult medical examination with abnormal findings 07/22/2021   Recurrent UTI (urinary tract infection) 07/13/2021   Estrogen deficiency 01/22/2021   OAB (overactive bladder) 11/01/2020   Carpal tunnel syndrome, left upper limb 04/05/2020   Iron deficiency anemia due to chronic blood loss 09/08/2019   Interstitial pulmonary disease (HCC) 09/08/2019   OA (osteoarthritis) of knee 07/26/2019   Lung nodule 06/24/2019   DDD (degenerative disc disease), cervical 04/16/2019   Zinc deficiency 10/03/2017   Spondylosis, lumbar, with myelopathy 04/09/2017   GERD (gastroesophageal reflux disease) 08/03/2015   Hemolytic anemia (HCC) 07/12/2015   Chronic venous insufficiency 08/10/2014   Aortic stenosis, mild 06/29/2014   Left ventricular diastolic dysfunction, NYHA class 1 06/29/2014   Senile osteopenia 04/21/2014   Hyperlipidemia with target LDL less than 130 04/21/2014   Kidney disease, chronic, stage III (GFR 30-59 ml/min) (HCC) 04/21/2014   Carotid artery stenosis without cerebral infarction 01/16/2010   Essential hypertension 10/19/2007   Past Medical History:  Diagnosis Date   ANEMIA 08/22/2009   Aortic stenosis    AR (aortic regurgitation) 01/2019   Mild to Moderate, Noted on ECHO   CAROTID ARTERY STENOSIS 01/16/2010   Complication of anesthesia    very hard to wake up from surgery   DYSPNEA ON EXERTION 11/16/2007   Gallstones    GEN OSTEOARTHROSIS INVOLVING MULTIPLE SITES 11/11/2008   GERD (gastroesophageal reflux disease)    Grade I diastolic dysfunction 01/2018   Noted on ECHO   Heart murmur    HEMORRHOIDS, INTERNAL    surgery was in the 90's (per patient)   History of blood transfusion    after hip and knee replacements   NECK PAIN, ACUTE 12/28/2009   PERIPHERAL EDEMA 10/06/2007   PONV (postoperative nausea and vomiting)    Pulmonary nodule 06/18/2019   noted on CT Chest    PVD 10/06/2007   Unspecified essential  hypertension 10/19/2007    Family History  Problem Relation Age of Onset   Ovarian cancer Mother    Cancer Mother    Leukemia Father    Lung cancer Father    Cancer Father    Cancer Brother    Diabetes Neg Hx    Heart disease Neg Hx    Hypertension Neg Hx     Past Surgical History:  Procedure Laterality Date   ABDOMINAL HYSTERECTOMY     BIOPSY  08/06/2019  Procedure: BIOPSY;  Surgeon: Iva Boop, MD;  Location: [facility];  Service: Gastroenterology;;   Arbutus Leas     bilat   CARPAL TUNNEL RELEASE     CHOLECYSTECTOMY  01/29/2016   at [facility]   CHOLECYSTECTOMY     ENDOSCOPIC RETROGRADE CHOLANGIOPANCREATOGRAPHY (ERCP) WITH PROPOFOL N/A 12/01/2015   Procedure: ENDOSCOPIC RETROGRADE CHOLANGIOPANCREATOGRAPHY (ERCP) WITH PROPOFOL;  Surgeon: Rachael Fee, MD;  Location: [facility];  Service: Endoscopy;  Laterality: N/A;   ENDOSCOPIC RETROGRADE CHOLANGIOPANCREATOGRAPHY (ERCP) WITH PROPOFOL N/A 02/01/2016   Procedure: ENDOSCOPIC RETROGRADE CHOLANGIOPANCREATOGRAPHY (ERCP) WITH PROPOFOL;  Surgeon: Rachael Fee, MD;  Location: [facility];  Service: Endoscopy;  Laterality: N/A;   ENDOSCOPIC RETROGRADE CHOLANGIOPANCREATOGRAPHY (ERCP) WITH PROPOFOL N/A 04/07/2018   Procedure: ENDOSCOPIC RETROGRADE CHOLANGIOPANCREATOGRAPHY (ERCP) WITH PROPOFOL;  Surgeon: Rachael Fee, MD;  Location: [facility];  Service: Endoscopy;  Laterality: N/A;   ESOPHAGOGASTRODUODENOSCOPY (EGD) WITH PROPOFOL N/A 08/06/2019   Procedure: ESOPHAGOGASTRODUODENOSCOPY (EGD) WITH PROPOFOL;  Surgeon: Iva Boop, MD;  Location: [facility];  Service: Gastroenterology;  Laterality: N/A;   HAMMER TOE SURGERY     HEMORRHOID SURGERY     Hyperplastic colon polyps, removed  2007   By Dr. Evette Cristal   JOINT REPLACEMENT  2011   right knee   OOPHORECTOMY     REMOVAL OF STONES  04/07/2018   Procedure: REMOVAL OF STONES;  Surgeon: Rachael Fee, MD;  Location: [facility];  Service: Endoscopy;;    ROTATOR CUFF REPAIR  2004   left (Dr. Cleophas Dunker)   SPHINCTEROTOMY  04/07/2018   Procedure: Dennison Mascot;  Surgeon: Rachael Fee, MD;  Location: [facility];  Service: Endoscopy;;   TOTAL HIP ARTHROPLASTY Right 11/26/2016   Procedure: RIGHT TOTAL HIP ARTHROPLASTY;  Surgeon: Valeria Batman, MD;  Location[facility];  Service: Orthopedics;  Laterality: Right;   TOTAL KNEE ARTHROPLASTY Right    TOTAL KNEE ARTHROPLASTY Left 07/26/2019   Procedure: TOTAL KNEE ARTHROPLASTY;  Surgeon: Ollen Gross, MD;  Location: [facility];  Service: Orthopedics;  Laterality: Left;    Social History   Occupational History   Occupation: Producer, television/film/video: RETIRED    Comment: retired  Tobacco Use   Smoking status: Never   Smokeless tobacco: Never  Vaping Use   Vaping status: Never Used  Substance and Sexual Activity   Alcohol use: No   Drug use: No   Sexual activity: Not Currently    Birth control/protection: Surgical

## 2023-12-29 ENCOUNTER — Ambulatory Visit: Payer: Medicare Other | Attending: Physician Assistant

## 2023-12-29 DIAGNOSIS — M25552 Pain in left hip: Secondary | ICD-10-CM | POA: Insufficient documentation

## 2023-12-29 DIAGNOSIS — M7062 Trochanteric bursitis, left hip: Secondary | ICD-10-CM | POA: Insufficient documentation

## 2023-12-29 DIAGNOSIS — R29898 Other symptoms and signs involving the musculoskeletal system: Secondary | ICD-10-CM | POA: Diagnosis not present

## 2023-12-29 NOTE — Therapy (Signed)
OUTPATIENT PHYSICAL THERAPY LOWER EXTREMITY EVALUATION   Patient Name: Claudia Lara MRN: [id number] DOB:[birth year], 89 y.o., female Today's Date: 12/30/2023  END OF SESSION:  PT End of Session - 12/29/23 1347     Visit Number 1    Date for PT Re-Evaluation 02/23/24    Progress Note Due on Visit 10    PT Start Time 1345    PT Stop Time 1430    PT Time Calculation (min) 45 min             Past Medical History:  Diagnosis Date   ANEMIA 08/22/2009   Aortic stenosis    AR (aortic regurgitation) 01/2019   Mild to Moderate, Noted on ECHO   CAROTID ARTERY STENOSIS 01/16/2010   Complication of anesthesia    very hard to wake up from surgery   DYSPNEA ON EXERTION 11/16/2007   Gallstones    GEN OSTEOARTHROSIS INVOLVING MULTIPLE SITES 11/11/2008   GERD (gastroesophageal reflux disease)    Grade I diastolic dysfunction 01/2018   Noted on ECHO   Heart murmur    HEMORRHOIDS, INTERNAL    surgery was in the 90's (per patient)   History of blood transfusion    after hip and knee replacements   NECK PAIN, ACUTE 12/28/2009   PERIPHERAL EDEMA 10/06/2007   PONV (postoperative nausea and vomiting)    Pulmonary nodule 06/18/2019   noted on CT Chest    PVD 10/06/2007   Unspecified essential hypertension 10/19/2007   Past Surgical History:  Procedure Laterality Date   ABDOMINAL HYSTERECTOMY     BIOPSY  08/06/2019   Procedure: BIOPSY;  Surgeon: Iva Boop, MD;  Location: [facility];  Service: Gastroenterology;;   Arbutus Leas     bilat   CARPAL TUNNEL RELEASE     CHOLECYSTECTOMY  01/29/2016   at [facility]   CHOLECYSTECTOMY     ENDOSCOPIC RETROGRADE CHOLANGIOPANCREATOGRAPHY (ERCP) WITH PROPOFOL N/A 12/01/2015   Procedure: ENDOSCOPIC RETROGRADE CHOLANGIOPANCREATOGRAPHY (ERCP) WITH PROPOFOL;  Surgeon: Rachael Fee, MD;  Location: [facility];  Service: Endoscopy;  Laterality: N/A;   ENDOSCOPIC RETROGRADE CHOLANGIOPANCREATOGRAPHY (ERCP) WITH PROPOFOL N/A  02/01/2016   Procedure: ENDOSCOPIC RETROGRADE CHOLANGIOPANCREATOGRAPHY (ERCP) WITH PROPOFOL;  Surgeon: Rachael Fee, MD;  Location: [facility];  Service: Endoscopy;  Laterality: N/A;   ENDOSCOPIC RETROGRADE CHOLANGIOPANCREATOGRAPHY (ERCP) WITH PROPOFOL N/A 04/07/2018   Procedure: ENDOSCOPIC RETROGRADE CHOLANGIOPANCREATOGRAPHY (ERCP) WITH PROPOFOL;  Surgeon: Rachael Fee, MD;  Location: [facility];  Service: Endoscopy;  Laterality: N/A;   ESOPHAGOGASTRODUODENOSCOPY (EGD) WITH PROPOFOL N/A 08/06/2019   Procedure: ESOPHAGOGASTRODUODENOSCOPY (EGD) WITH PROPOFOL;  Surgeon: Iva Boop, MD;  Location: [facility];  Service: Gastroenterology;  Laterality: N/A;   HAMMER TOE SURGERY     HEMORRHOID SURGERY     Hyperplastic colon polyps, removed  2007   By Dr. Evette Cristal   JOINT REPLACEMENT  2011   right knee   OOPHORECTOMY     REMOVAL OF STONES  04/07/2018   Procedure: REMOVAL OF STONES;  Surgeon: Rachael Fee, MD;  Location: [facility];  Service: Endoscopy;;   ROTATOR CUFF REPAIR  2004   left (Dr. Cleophas Dunker)   SPHINCTEROTOMY  04/07/2018   Procedure: Dennison Mascot;  Surgeon: Rachael Fee, MD;  Location: [facility];  Service: Endoscopy;;   TOTAL HIP ARTHROPLASTY Right 11/26/2016   Procedure: RIGHT TOTAL HIP ARTHROPLASTY;  Surgeon: Valeria Batman, MD;  Location: [facility];  Service: Orthopedics;  Laterality: Right;   TOTAL KNEE ARTHROPLASTY Right    TOTAL  KNEE ARTHROPLASTY Left 07/26/2019   Procedure: TOTAL KNEE ARTHROPLASTY;  Surgeon: Ollen Gross, MD;  Location: [facility];  Service: Orthopedics;  Laterality: Left;    Patient Active Problem List   Diagnosis Date Noted   Trochanteric bursitis, left hip 12/18/2023   DOE (dyspnea on exertion) 08/05/2023   Chronic hyperglycemia 08/05/2023   Psychophysiological insomnia 02/13/2023   Encounter for general adult medical examination with abnormal findings 07/22/2021   Recurrent UTI (urinary tract infection) 07/13/2021   Estrogen  deficiency 01/22/2021   OAB (overactive bladder) 11/01/2020   Carpal tunnel syndrome, left upper limb 04/05/2020   Iron deficiency anemia due to chronic blood loss 09/08/2019   Interstitial pulmonary disease (HCC) 09/08/2019   OA (osteoarthritis) of knee 07/26/2019   Lung nodule 06/24/2019   DDD (degenerative disc disease), cervical 04/16/2019   Zinc deficiency 10/03/2017   Spondylosis, lumbar, with myelopathy 04/09/2017   GERD (gastroesophageal reflux disease) 08/03/2015   Hemolytic anemia (HCC) 07/12/2015   Chronic venous insufficiency 08/10/2014   Aortic stenosis, mild 06/29/2014   Left ventricular diastolic dysfunction, NYHA class 1 06/29/2014   Senile osteopenia 04/21/2014   Hyperlipidemia with target LDL less than 130 04/21/2014   Kidney disease, chronic, stage III (GFR 30-59 ml/min) (HCC) 04/21/2014   Carotid artery stenosis without cerebral infarction 01/16/2010   Essential hypertension 10/19/2007    PCP: Sanda Linger, MD  REFERRING PROVIDER: West Bali Persons, PA  REFERRING DIAG: L hip gr trochanteric bursitis  THERAPY DIAG:  Greater trochanteric bursitis of left hip  Pain in left hip  Weakness of both hips  Rationale for Evaluation and Treatment: Rehabilitation  ONSET DATE: January 2025  SUBJECTIVE:   SUBJECTIVE STATEMENT: I was hurting so much in my L hip , so that I couldn't lie on it and couldn't sleep.  So I saw the orthopedist and they gave me a shot and it worked for 24 hours, then the pain came back.  Then it seems to have been better lately.  I almost cancelled today's appt.  PERTINENT HISTORY: Per pt she may have injury L hip shoveling snow. Has seen orthopedist 2 x PAIN:  Are you having pain? No  PRECAUTIONS: Fall  RED FLAGS: None   WEIGHT BEARING RESTRICTIONS: No  FALLS:  Has patient fallen in last 6 months? No  LIVING ENVIRONMENT: Lives with: lives alone Lives in: House/apartment Stairs: reports several sets of stairs outdoors to get  into her home Has following equipment at home: Single point cane and Environmental consultant - 2 wheeled  OCCUPATION: retired  PLOF: Independent with household mobility without device and Independent with community mobility with device  PATIENT GOALS: " I don't know why I am here" I don't know that I need PT  NEXT MD VISIT: 01/08/24  OBJECTIVE:  Note: Objective measures were completed at Evaluation unless otherwise noted.  DIAGNOSTIC FINDINGS: Radiographs of her pelvis and left hip no evidence of acute fracture she  does have some degenerative changes of the hip   PATIENT SURVEYS:  Harris Hip score: 81.80 %  COGNITION: Overall cognitive status: Within functional limits for tasks assessed     SENSATION: WFL  EDEMA:  na  MUSCLE LENGTH: Hamstrings: Right wfl deg; Left wfl deg Maisie Fus test: Right wfl deg; Left wfl deg  POSTURE: multiple arthritic changes noted B hands, valgum B , scoliotic changes with concavity on L lumbar, L iliac crest higher  PALPATION: Tenderness noted L gr trochanter, L IT band, L vastus lateralis, L glut med  LOWER  EXTREMITY ROM:  AA ROM Right eval Left eval  Hip flexion  wfl  Hip extension    Hip abduction  30  Hip adduction    Hip internal rotation  15  Hip external rotation  25  Knee flexion    Knee extension    Ankle dorsiflexion    Ankle plantarflexion    Ankle inversion    Ankle eversion     (Blank rows = not tested)  LOWER EXTREMITY MMT:  MMT Right eval Left eval  Hip flexion    Hip extension 4 4  Hip abduction 4 4  Hip adduction    Hip internal rotation    Hip external rotation 4+ 4+  Knee flexion    Knee extension    Ankle dorsiflexion    Ankle plantarflexion    Ankle inversion    Ankle eversion     (Blank rows = not tested)    FUNCTIONAL TESTS:  5 times sit to stand: 17.96 sec  GAIT: Distance walked: in clinic, greater than 100'                                                                                                                                 TREATMENT DATE: 12/29/23: Evaluation, instructed in the following ex to address bulk gluteal / functional strength: Sit to stand: advised to perform 5 x once a day at one meal Seated theraband hip abd/ER advised to perform 15 reps, 3 x weekly    PATIENT EDUCATION:  Education details: POC, goals Person educated: Patient Education method: Explanation, Demonstration, Tactile cues, and Verbal cues Education comprehension: verbalized understanding, returned demonstration, verbal cues required, and tactile cues required  HOME EXERCISE PROGRAM: See above  ASSESSMENT:  CLINICAL IMPRESSION: Patient is a 89 y.o. female who was evaluated today by skilled physical therapy for L greater trochanteric bursitis.  She was vocal several times during the evaluation about not wishing to attend PT on a regular basis.  She did have some point tenderness with palpation of L gr trochanter and TFL, IT band, lateral hip musculature, but reports sleeping better since her injections. Scored well on 5 x sit to stand and able to ambulate well with st cane.  Did advise her that we can address some of the areas of tenderness L lateral hip with manual techniques, massage, TPDN, taping for example, but she did not want to pursue at time of eval.  Therefore instructed in therex designed to increase muscle bulk around L post hip. She will follow up with referring MD on 01/08/24 and advised her if her sx are worsening to discuss returning to PT at that time for additional assessment and treatment.    OBJECTIVE IMPAIRMENTS: decreased strength, postural dysfunction, and pain.   ACTIVITY LIMITATIONS: sleeping  PARTICIPATION LIMITATIONS:  na  PERSONAL FACTORS: Age, Behavior pattern, Fitness, Past/current experiences, and 1-2 comorbidities: multiple jt replacements, OA diffuse  are also affecting patient's functional  outcome.   REHAB POTENTIAL: Good  CLINICAL DECISION MAKING: Evolving/moderate  complexity  EVALUATION COMPLEXITY: Moderate   GOALS: Goals reviewed with patient? Yes  SHORT TERM GOALS: Target date: 12/29/23 I with HEP Baseline:established and pt demonstrated well at eval. Goal status: INITIAL   PLAN:  PT FREQUENCY: one time visit  PT DURATION:  1 sessions  PLANNED INTERVENTIONS: na  PLAN FOR NEXT SESSION: pt wanted one time visit, will return to referring MD for reassessment 01/08/24 and if additional PT needed will reassess once scheduled /ordered   Early Chars, PT, DPT, OCS 12/30/2023, 5:42 PM

## 2023-12-30 ENCOUNTER — Other Ambulatory Visit: Payer: Self-pay

## 2024-01-05 ENCOUNTER — Other Ambulatory Visit: Payer: Self-pay | Admitting: Physician Assistant

## 2024-01-05 DIAGNOSIS — D591 Autoimmune hemolytic anemia, unspecified: Secondary | ICD-10-CM

## 2024-01-08 ENCOUNTER — Ambulatory Visit: Payer: Medicare Other | Admitting: Physician Assistant

## 2024-01-08 ENCOUNTER — Encounter: Payer: Self-pay | Admitting: Physician Assistant

## 2024-01-08 DIAGNOSIS — M7062 Trochanteric bursitis, left hip: Secondary | ICD-10-CM | POA: Diagnosis not present

## 2024-01-08 NOTE — Progress Notes (Signed)
Office Visit Note   Patient: Claudia Lara           Date of Birth: 13-Nov-1932           MRN: [id number] Visit Date: 01/08/2024              Requested by: Etta Grandchild, MD [address] [place],  [place] [address] PCP: Etta Grandchild, MD  Chief Complaint  Patient presents with   Left Hip - Follow-up      HPI: Patient is a pleasant 89 year old woman who I saw a few weeks back for left trochanteric bursitis.  She comes in for follow-up today.  She did do 1 physical therapy session but reports that after her injection she is actually feeling quite a bit better can now sleep on the side  Assessment & Plan: Visit Diagnoses:  1. Trochanteric bursitis, left hip     Plan: Trochanteric bursitis left hip.  She is doing much better she does have stretching exercises to do on her own.  Did tell her this could recur and if this been 3 months could do another injection  Follow-Up Instructions: Return if symptoms worsen or fail to improve.   Ortho Exam  Patient is alert, oriented, no adenopathy, well-dressed, normal affect, normal respiratory effort. She has no tenderness over the trochanteric bursa has full range of motion no groin pain imaging: No results found. No images are attached to the encounter.  Labs: Lab Results  Component Value Date   HGBA1C 4.5 (L) 08/05/2023   HGBA1C 4.8 06/08/2019   HGBA1C 5.2 03/05/2016   ESRSEDRATE 1 04/27/2018   ESRSEDRATE 4 02/14/2014   CRP 0.1 (L) 04/27/2018   REPTSTATUS 09/02/2019 FINAL 09/01/2019   CULT (A) 09/01/2019    <10,000 COLONIES/mL INSIGNIFICANT GROWTH Performed at [facility], [address], [place], [place] [address]    LABORGA KLEBSIELLA OXYTOCA 06/06/2016     Lab Results  Component Value Date   ALBUMIN 4.4 12/23/2023   ALBUMIN 4.3 11/13/2023   ALBUMIN 4.3 09/01/2023    Lab Results  Component Value Date   MG 2.3 09/02/2019   MG 2.3 09/01/2019   MG 2.4 08/06/2019   Lab Results  Component Value Date    VD25OH 85.01 07/08/2018   VD25OH 58 04/21/2014    No results found for: "PREALBUMIN"    Latest Ref Rng & Units 12/23/2023    2:40 PM 11/13/2023    1:23 PM 11/13/2023    1:22 PM  CBC EXTENDED  WBC 4.0 - 10.5 K/uL 15.1   9.5   RBC 3.87 - 5.11 MIL/uL 3.76  3.06  3.09   Hemoglobin 12.0 - 15.0 g/dL 29.5   9.4   HCT 28.4 - 46.0 % 34.2   28.3   Platelets 150 - 400 K/uL 235   272   NEUT# 1.7 - 7.7 K/uL 13.6   7.2   Lymph# 0.7 - 4.0 K/uL 0.7   1.3      There is no height or weight on file to calculate BMI.  Orders:  No orders of the defined types were placed in this encounter.  No orders of the defined types were placed in this encounter.    Procedures: No procedures performed  Clinical Data: No additional findings.  ROS:  All other systems negative, except as noted in the HPI. Review of Systems  Objective: Vital Signs: There were no vitals taken for this visit.  Specialty Comments:  No specialty  comments available.  PMFS History: Patient Active Problem List   Diagnosis Date Noted   Trochanteric bursitis, left hip 12/18/2023   DOE (dyspnea on exertion) 08/05/2023   Chronic hyperglycemia 08/05/2023   Psychophysiological insomnia 02/13/2023   Encounter for general adult medical examination with abnormal findings 07/22/2021   Recurrent UTI (urinary tract infection) 07/13/2021   Estrogen deficiency 01/22/2021   OAB (overactive bladder) 11/01/2020   Carpal tunnel syndrome, left upper limb 04/05/2020   Iron deficiency anemia due to chronic blood loss 09/08/2019   Interstitial pulmonary disease (HCC) 09/08/2019   OA (osteoarthritis) of knee 07/26/2019   Lung nodule 06/24/2019   DDD (degenerative disc disease), cervical 04/16/2019   Zinc deficiency 10/03/2017   Spondylosis, lumbar, with myelopathy 04/09/2017   GERD (gastroesophageal reflux disease) 08/03/2015   Hemolytic anemia (HCC) 07/12/2015   Chronic venous insufficiency 08/10/2014   Aortic stenosis, mild  06/29/2014   Left ventricular diastolic dysfunction, NYHA class 1 06/29/2014   Senile osteopenia 04/21/2014   Hyperlipidemia with target LDL less than 130 04/21/2014   Kidney disease, chronic, stage III (GFR 30-59 ml/min) (HCC) 04/21/2014   Carotid artery stenosis without cerebral infarction 01/16/2010   Essential hypertension 10/19/2007   Past Medical History:  Diagnosis Date   ANEMIA 08/22/2009   Aortic stenosis    AR (aortic regurgitation) 01/2019   Mild to Moderate, Noted on ECHO   CAROTID ARTERY STENOSIS 01/16/2010   Complication of anesthesia    very hard to wake up from surgery   DYSPNEA ON EXERTION 11/16/2007   Gallstones    GEN OSTEOARTHROSIS INVOLVING MULTIPLE SITES 11/11/2008   GERD (gastroesophageal reflux disease)    Grade I diastolic dysfunction 01/2018   Noted on ECHO   Heart murmur    HEMORRHOIDS, INTERNAL    surgery was in the 90's (per patient)   History of blood transfusion    after hip and knee replacements   NECK PAIN, ACUTE 12/28/2009   PERIPHERAL EDEMA 10/06/2007   PONV (postoperative nausea and vomiting)    Pulmonary nodule 06/18/2019   noted on CT Chest    PVD 10/06/2007   Unspecified essential hypertension 10/19/2007    Family History  Problem Relation Age of Onset   Ovarian cancer Mother    Cancer Mother    Leukemia Father    Lung cancer Father    Cancer Father    Cancer Brother    Diabetes Neg Hx    Heart disease Neg Hx    Hypertension Neg Hx     Past Surgical History:  Procedure Laterality Date   ABDOMINAL HYSTERECTOMY     BIOPSY  08/06/2019   Procedure: BIOPSY;  Surgeon: Iva Boop, MD;  Location: [facility];  Service: Gastroenterology;;   Arbutus Leas     bilat   CARPAL TUNNEL RELEASE     CHOLECYSTECTOMY  01/29/2016   at [facility]   CHOLECYSTECTOMY     ENDOSCOPIC RETROGRADE CHOLANGIOPANCREATOGRAPHY (ERCP) WITH PROPOFOL N/A 12/01/2015   Procedure: ENDOSCOPIC RETROGRADE CHOLANGIOPANCREATOGRAPHY (ERCP) WITH  PROPOFOL;  Surgeon: Rachael Fee, MD;  Location: [facility];  Service: Endoscopy;  Laterality: N/A;   ENDOSCOPIC RETROGRADE CHOLANGIOPANCREATOGRAPHY (ERCP) WITH PROPOFOL N/A 02/01/2016   Procedure: ENDOSCOPIC RETROGRADE CHOLANGIOPANCREATOGRAPHY (ERCP) WITH PROPOFOL;  Surgeon: Rachael Fee, MD;  Location: [facility];  Service: Endoscopy;  Laterality: N/A;   ENDOSCOPIC RETROGRADE CHOLANGIOPANCREATOGRAPHY (ERCP) WITH PROPOFOL N/A 04/07/2018   Procedure: ENDOSCOPIC RETROGRADE CHOLANGIOPANCREATOGRAPHY (ERCP) WITH PROPOFOL;  Surgeon: Rachael Fee, MD;  Location: [facility];  Service: Endoscopy;  Laterality: N/A;   ESOPHAGOGASTRODUODENOSCOPY (EGD) WITH PROPOFOL N/A 08/06/2019   Procedure: ESOPHAGOGASTRODUODENOSCOPY (EGD) WITH PROPOFOL;  Surgeon: Iva Boop, MD;  Location: [facility];  Service: Gastroenterology;  Laterality: N/A;   HAMMER TOE SURGERY     HEMORRHOID SURGERY     Hyperplastic colon polyps, removed  2007   By Dr. Evette Cristal   JOINT REPLACEMENT  2011   right knee   OOPHORECTOMY     REMOVAL OF STONES  04/07/2018   Procedure: REMOVAL OF STONES;  Surgeon: Rachael Fee, MD;  Location: [facility];  Service: Endoscopy;;   ROTATOR CUFF REPAIR  2004   left (Dr. Cleophas Dunker)   SPHINCTEROTOMY  04/07/2018   Procedure: Dennison Mascot;  Surgeon: Rachael Fee, MD;  Location: [facility];  Service: Endoscopy;;   TOTAL HIP ARTHROPLASTY Right 11/26/2016   Procedure: RIGHT TOTAL HIP ARTHROPLASTY;  Surgeon: Valeria Batman, MD;  Location: [facility];  Service: Orthopedics;  Laterality: Right;   TOTAL KNEE ARTHROPLASTY Right    TOTAL KNEE ARTHROPLASTY Left 07/26/2019   Procedure: TOTAL KNEE ARTHROPLASTY;  Surgeon: Ollen Gross, MD;  Location: [facility];  Service: Orthopedics;  Laterality: Left;    Social History   Occupational History   Occupation: Producer, television/film/video: RETIRED    Comment: retired  Tobacco Use   Smoking status: Never   Smokeless tobacco: Never  Vaping Use    Vaping status: Never Used  Substance and Sexual Activity   Alcohol use: No   Drug use: No   Sexual activity: Not Currently    Birth control/protection: Surgical

## 2024-01-28 ENCOUNTER — Other Ambulatory Visit: Payer: Medicare Other

## 2024-01-28 ENCOUNTER — Ambulatory Visit: Payer: Medicare Other | Admitting: Internal Medicine

## 2024-01-28 DIAGNOSIS — Z961 Presence of intraocular lens: Secondary | ICD-10-CM | POA: Diagnosis not present

## 2024-01-28 DIAGNOSIS — H04123 Dry eye syndrome of bilateral lacrimal glands: Secondary | ICD-10-CM | POA: Diagnosis not present

## 2024-01-28 DIAGNOSIS — H524 Presbyopia: Secondary | ICD-10-CM | POA: Diagnosis not present

## 2024-01-28 DIAGNOSIS — H35363 Drusen (degenerative) of macula, bilateral: Secondary | ICD-10-CM | POA: Diagnosis not present

## 2024-01-29 ENCOUNTER — Ambulatory Visit (INDEPENDENT_AMBULATORY_CARE_PROVIDER_SITE_OTHER): Payer: Medicare Other | Admitting: Internal Medicine

## 2024-01-29 ENCOUNTER — Encounter: Payer: Self-pay | Admitting: Internal Medicine

## 2024-01-29 VITALS — BP 154/58 | HR 83 | Temp 98.6°F | Resp 16 | Ht 60.35 in | Wt 166.6 lb

## 2024-01-29 DIAGNOSIS — R739 Hyperglycemia, unspecified: Secondary | ICD-10-CM

## 2024-01-29 DIAGNOSIS — D500 Iron deficiency anemia secondary to blood loss (chronic): Secondary | ICD-10-CM

## 2024-01-29 DIAGNOSIS — Z0000 Encounter for general adult medical examination without abnormal findings: Secondary | ICD-10-CM

## 2024-01-29 DIAGNOSIS — I10 Essential (primary) hypertension: Secondary | ICD-10-CM | POA: Diagnosis not present

## 2024-01-29 DIAGNOSIS — R1013 Epigastric pain: Secondary | ICD-10-CM | POA: Diagnosis not present

## 2024-01-29 DIAGNOSIS — N1832 Chronic kidney disease, stage 3b: Secondary | ICD-10-CM

## 2024-01-29 DIAGNOSIS — Z0001 Encounter for general adult medical examination with abnormal findings: Secondary | ICD-10-CM

## 2024-01-29 DIAGNOSIS — N184 Chronic kidney disease, stage 4 (severe): Secondary | ICD-10-CM | POA: Diagnosis not present

## 2024-01-29 LAB — HEPATIC FUNCTION PANEL
ALT: 21 U/L (ref 0–35)
AST: 15 U/L (ref 0–37)
Albumin: 4.2 g/dL (ref 3.5–5.2)
Alkaline Phosphatase: 57 U/L (ref 39–117)
Bilirubin, Direct: 0.4 mg/dL — ABNORMAL HIGH (ref 0.0–0.3)
Total Bilirubin: 1.9 mg/dL — ABNORMAL HIGH (ref 0.2–1.2)
Total Protein: 6.3 g/dL (ref 6.0–8.3)

## 2024-01-29 LAB — IBC + FERRITIN
Ferritin: 940.2 ng/mL — ABNORMAL HIGH (ref 10.0–291.0)
Iron: 52 ug/dL (ref 42–145)
Saturation Ratios: 24.9 % (ref 20.0–50.0)
TIBC: 208.6 ug/dL — ABNORMAL LOW (ref 250.0–450.0)
Transferrin: 149 mg/dL — ABNORMAL LOW (ref 212.0–360.0)

## 2024-01-29 LAB — BASIC METABOLIC PANEL
BUN: 35 mg/dL — ABNORMAL HIGH (ref 6–23)
CO2: 26 meq/L (ref 19–32)
Calcium: 9.1 mg/dL (ref 8.4–10.5)
Chloride: 104 meq/L (ref 96–112)
Creatinine, Ser: 1.55 mg/dL — ABNORMAL HIGH (ref 0.40–1.20)
GFR: 29.45 mL/min — ABNORMAL LOW (ref >60.00)
Glucose, Bld: 140 mg/dL — ABNORMAL HIGH (ref 70–99)
Potassium: 4.6 meq/L (ref 3.5–5.1)
Sodium: 137 meq/L (ref 135–145)

## 2024-01-29 LAB — CBC WITH DIFFERENTIAL/PLATELET
Basophils Absolute: 0.1 10*3/uL (ref 0.0–0.1)
Basophils Relative: 0.5 % (ref 0.0–3.0)
Eosinophils Absolute: 0.1 10*3/uL (ref 0.0–0.7)
Eosinophils Relative: 0.9 % (ref 0.0–5.0)
HCT: 29.8 % — ABNORMAL LOW (ref 36.0–46.0)
Hemoglobin: 10.2 g/dL — ABNORMAL LOW (ref 12.0–15.0)
Lymphocytes Relative: 4.6 % — ABNORMAL LOW (ref 12.0–46.0)
Lymphs Abs: 0.7 10*3/uL (ref 0.7–4.0)
MCHC: 34.3 g/dL (ref 30.0–36.0)
MCV: 89.3 fl (ref 78.0–100.0)
Monocytes Absolute: 0.7 10*3/uL (ref 0.1–1.0)
Monocytes Relative: 4.6 % (ref 3.0–12.0)
Neutro Abs: 12.8 10*3/uL — ABNORMAL HIGH (ref 1.4–7.7)
Neutrophils Relative %: 89.4 % — ABNORMAL HIGH (ref 43.0–77.0)
Platelets: 240 10*3/uL (ref 150.0–400.0)
RBC: 3.33 Mil/uL — ABNORMAL LOW (ref 3.87–5.11)
RDW: 20.4 % — ABNORMAL HIGH (ref 11.5–15.5)
WBC: 14.3 10*3/uL — ABNORMAL HIGH (ref 4.0–10.5)

## 2024-01-29 LAB — HEMOGLOBIN A1C: Hgb A1c MFr Bld: 4.4 % — ABNORMAL LOW (ref 4.6–6.5)

## 2024-01-29 NOTE — Progress Notes (Signed)
 Subjective:  Patient ID: Claudia Lara, female    DOB: 1932/10/20  Age: 89 y.o. MRN: [id number]  CC: Annual Exam, Anemia, Abdominal Pain, and Hypertension   HPI [name] presents for a CPX and f/up ----    Discussed the use of AI scribe software for clinical note transcription with the patient, who gave verbal consent to proceed.  History of Present Illness   Claudia Lara is an 89 year old female who presents with epigastric pain and leg swelling.  She experienced severe epigastric pain a couple of weeks ago, which occurred on a Friday after supper and recurred the following day. The pain was intense, described as 'it hurt like crazy' and 'I thought I was dying.' She has not experienced this pain before or since those two days. No nausea, vomiting, or changes in bowel movements were associated with the pain.  She notes persistent swelling in her legs, which is particularly bad today. She also reports frequent urination but denies any painful or bloody urine. Her urine is more yellow than usual. She is unable to take her diuretic medication daily due to frequent urination.  She is currently taking prednisone but not tramadol, as she does not feel the need for pain medication. She has a history of anemia for which she was referred to a specialist in 2016, but she feels the treatment was not beneficial.  She describes a change in her sleep pattern, noting that she falls asleep while watching TV, which she did not do previously. She attributes some of her fatigue to her age, as she will be turning 89 next month. She remains active, working in her yard regularly.       Outpatient Medications Prior to Visit  Medication Sig Dispense Refill   Calcium Carb-Cholecalciferol (CALCIUM CARBONATE-VITAMIN D3 PO) Take 1 tablet by mouth daily.      carvedilol (COREG) 12.5 MG tablet TAKE 1 TABLET BY MOUTH TWICE A DAY WITH FOOD 180 tablet 1   cholecalciferol (VITAMIN D) 1000 units tablet Take 1,000  Units by mouth daily.     ferrous sulfate 325 (65 FE) MG tablet Take 325 mg by mouth daily with breakfast.     losartan (COZAAR) 25 MG tablet Take 1 tablet (25 mg total) by mouth daily. 90 tablet 3   methenamine (HIPREX) 1 g tablet TAKE 1 TABLET (1 G TOTAL) BY MOUTH 2 (TWO) TIMES DAILY WITH A MEAL. 180 tablet 1   Multiple Vitamin (MULTIVITAMIN WITH MINERALS) TABS tablet Take 1 tablet by mouth daily.     Multiple Vitamins-Minerals (ZINC PO) Take by mouth.     predniSONE (DELTASONE) 10 MG tablet TAKE 1 TABLET (10 MG TOTAL) BY MOUTH DAILY WITH BREAKFAST. 30 tablet 1   torsemide (DEMADEX) 20 MG tablet TAKE 1 TABLET BY MOUTH EVERY DAY 90 tablet 1   vitamin C (ASCORBIC ACID) 500 MG tablet Take 500 mg by mouth daily.     traMADol (ULTRAM) 50 MG tablet Take 1 tablet (50 mg total) by mouth every 12 (twelve) hours as needed. (Patient not taking: Reported on 01/30/2024) 20 tablet 0   No facility-administered medications prior to visit.    ROS Review of Systems  Constitutional: Negative.  Negative for appetite change, diaphoresis, fatigue and unexpected weight change.  HENT: Negative.  Negative for trouble swallowing.   Eyes: Negative.   Respiratory: Negative.  Negative for cough, chest tightness, shortness of breath and wheezing.   Cardiovascular:  Negative for chest pain, palpitations  and leg swelling.  Gastrointestinal:  Positive for abdominal pain. Negative for constipation, diarrhea, nausea and vomiting.  Genitourinary:  Negative for difficulty urinating, dysuria and hematuria.  Musculoskeletal:  Positive for arthralgias. Negative for myalgias.  Skin: Negative.   Neurological:  Negative for dizziness and weakness.  Hematological:  Negative for adenopathy. Does not bruise/bleed easily.  Psychiatric/Behavioral:  Positive for confusion and decreased concentration.     Objective:  BP (!) 154/58 (BP Location: Left Arm, Patient Position: Sitting, Cuff Size: Normal)   Pulse 83   Temp 98.6 F (37 C)  (Oral)   Resp 16   Ht 5' 0.35" (1.533 m)   Wt 166 lb 9.6 oz (75.6 kg)   SpO2 96%   BMI 32.16 kg/m   BP Readings from Last 3 Encounters:  01/30/24 130/68  01/29/24 (!) 154/58  12/23/23 (!) 179/65    Wt Readings from Last 3 Encounters:  01/30/24 167 lb (75.8 kg)  01/29/24 166 lb 9.6 oz (75.6 kg)  12/23/23 165 lb 8 oz (75.1 kg)    Physical Exam Vitals reviewed.  Constitutional:      Appearance: She is well-developed.  HENT:     Mouth/Throat:     Mouth: Mucous membranes are moist.  Eyes:     General: Scleral icterus present.     Conjunctiva/sclera: Conjunctivae normal.  Cardiovascular:     Rate and Rhythm: Normal rate and regular rhythm. Occasional Extrasystoles are present.    Heart sounds: Murmur heard.     Systolic murmur is present with a grade of 3/6.     No friction rub. No gallop.     Comments: EKG- SR with occasional PVC, 79 bpm Anterior infarct pattern is not new No LVH or acute ST/T wave changes Pulmonary:     Effort: Pulmonary effort is normal.     Breath sounds: No stridor. No wheezing, rhonchi or rales.  Abdominal:     General: Abdomen is flat. Bowel sounds are normal. There is no distension.     Palpations: There is no mass.     Tenderness: There is no abdominal tenderness. There is no guarding.     Hernia: No hernia is present.  Musculoskeletal:     Cervical back: Neck supple.     Right lower leg: No edema.     Left lower leg: No edema.  Skin:    General: Skin is warm and dry.     Coloration: Skin is pale. Skin is not jaundiced.     Findings: No bruising.  Neurological:     General: No focal deficit present.     Mental Status: She is alert. Mental status is at baseline.  Psychiatric:        Mood and Affect: Mood normal.        Behavior: Behavior normal.     Lab Results  Component Value Date   WBC 14.3 (H) 01/29/2024   HGB 10.2 (L) 01/29/2024   HCT 29.8 (L) 01/29/2024   PLT 240.0 01/29/2024   GLUCOSE 140 (H) 01/29/2024   CHOL 132  11/12/2022   TRIG 120.0 11/12/2022   HDL 53.90 11/12/2022   LDLDIRECT 56.0 07/08/2018   LDLCALC 54 11/12/2022   ALT 21 01/29/2024   AST 15 01/29/2024   NA 137 01/29/2024   K 4.6 01/29/2024   CL 104 01/29/2024   CREATININE 1.55 (H) 01/29/2024   BUN 35 (H) 01/29/2024   CO2 26 01/29/2024   TSH 0.90 02/13/2023   INR 1.1 (H) 05/17/2020  HGBA1C 4.4 (L) 01/29/2024    NM PET Image Initial (PI) Skull Base To Thigh Result Date: 02/01/2023 CLINICAL DATA:  Initial treatment strategy for lung nodule. EXAM: NUCLEAR MEDICINE PET SKULL BASE TO THIGH TECHNIQUE: 8.39 mCi F-18 FDG was injected intravenously. Full-ring PET imaging was performed from the skull base to thigh after the radiotracer. CT data was obtained and used for attenuation correction and anatomic localization. Fasting blood glucose: 113 mg/dl COMPARISON:  CT chest 16/08/9603 FINDINGS: Mediastinal blood pool activity: SUV max 2.70 Liver activity: SUV max NA NECK: No hypermetabolic lymph nodes in the neck. Incidental CT findings: None. CHEST: No tracer avid supraclavicular, axillary, mediastinal, or hilar lymph nodes. Central right upper lobe lung nodule measures 1.5 cm and has an SUV max of 3.08, image 31/7. On the CT from 07/02/2019 this measured 0.9 cm within SUV max of 0.89. Incidental CT findings: Aortic atherosclerosis with coronary artery calcifications. ABDOMEN/PELVIS: No abnormal hypermetabolic activity within the liver, pancreas, adrenal glands, or spleen. No hypermetabolic lymph nodes in the abdomen or pelvis. Incidental CT findings: Aortic atherosclerosis. Cholecystectomy. Pneumobilia is identified compatible with prior sphincterotomy and biliary patency. Sigmoid diverticulosis without acute diverticulitis. SKELETON: No focal hypermetabolic activity to suggest skeletal metastasis. Incidental CT findings: None. IMPRESSION: 1. There has interval increase in size and FDG uptake associated with the right upper lobe lung nodule when compared  with 01/13/2023. This is suspicious for primary bronchogenic carcinoma. 2. No signs of tracer avid nodal metastasis or distant metastatic disease. 3. Coronary artery calcifications. 4. Status post cholecystectomy with pneumobilia compatible with prior sphincterotomy and biliary patency. 5.  Aortic Atherosclerosis (ICD10-I70.0). Electronically Signed   By: Signa Kell M.D.   On: 02/01/2023 11:53    Assessment & Plan:   Essential hypertension- Her BP is well controlled. EKG is negative for LVH. -     EKG 12-Lead -     Basic metabolic panel; Future -     Urinalysis, Routine w reflex microscopic; Future  Stage 3b chronic kidney disease (HCC) -     Basic metabolic panel; Future -     Urinalysis, Routine w reflex microscopic; Future  Iron deficiency anemia due to chronic blood loss -     CBC with Differential/Platelet; Future -     IBC + Ferritin; Future  Encounter for general adult medical examination with abnormal findings- Exam completed, labs reviewed, vaccines reviewed, no cancer screenings indicated, pt ed material was given.   Chronic hyperglycemia -     Basic metabolic panel; Future -     Fructosamine; Future -     Hemoglobin A1c; Future  Epigastric pain -     Hepatic function panel; Future -     Urinalysis, Routine w reflex microscopic; Future  Kidney disease, chronic, stage IV (GFR 15-29 ml/min) (HCC) -     Ambulatory referral to Nephrology  Other orders -     EKG     Follow-up: Return in about 3 months (around 04/30/2024).  Sanda Linger, MD

## 2024-01-29 NOTE — Patient Instructions (Signed)

## 2024-01-30 ENCOUNTER — Encounter: Payer: Self-pay | Admitting: Pulmonary Disease

## 2024-01-30 ENCOUNTER — Encounter: Payer: Self-pay | Admitting: Internal Medicine

## 2024-01-30 ENCOUNTER — Ambulatory Visit: Payer: Medicare Other | Admitting: Pulmonary Disease

## 2024-01-30 VITALS — BP 130/68 | HR 79 | Ht 62.5 in | Wt 167.0 lb

## 2024-01-30 DIAGNOSIS — Z66 Do not resuscitate: Secondary | ICD-10-CM

## 2024-01-30 DIAGNOSIS — N184 Chronic kidney disease, stage 4 (severe): Secondary | ICD-10-CM | POA: Insufficient documentation

## 2024-01-30 DIAGNOSIS — J849 Interstitial pulmonary disease, unspecified: Secondary | ICD-10-CM

## 2024-01-30 DIAGNOSIS — R918 Other nonspecific abnormal finding of lung field: Secondary | ICD-10-CM

## 2024-01-30 DIAGNOSIS — Z7189 Other specified counseling: Secondary | ICD-10-CM

## 2024-01-30 DIAGNOSIS — R1013 Epigastric pain: Secondary | ICD-10-CM | POA: Insufficient documentation

## 2024-01-30 LAB — URINALYSIS, ROUTINE W REFLEX MICROSCOPIC
Bilirubin Urine: NEGATIVE
Hgb urine dipstick: NEGATIVE
Ketones, ur: NEGATIVE
Nitrite: NEGATIVE
Specific Gravity, Urine: 1.015 (ref 1.000–1.030)
Urine Glucose: NEGATIVE
Urobilinogen, UA: 0.2 (ref 0.0–1.0)
pH: 6 (ref 5.0–8.0)

## 2024-01-30 NOTE — Progress Notes (Signed)
 Claudia Lara    [id number]    [birth year]  Primary Care Physician:Jones, Bernadene Bell, MD  Referring Physician: Etta Grandchild, MD [address] [place],  [place] [address]   Chief complaint: Follow-up for dyspnea, lung nodule  HPI: 89 y.o.  with history of aortic stenosis, exertional dyspnea, arthritis. Complains of progressive dyspnea for the past 10 years.  Symptoms are mostly on exertion.  She does not have symptoms at rest, no cough, wheezing, mucus production. She follows with Dr. Tenny Craw for mild aortic stenosis. Echo shows stable aortic stenosis with diastolic dysfunction, elevated filling pressures.  She had PFTs which showed isolated reduction in diffusion capacity and is referred to pulmonary for further evaluation  She has chronic arthritis of the DIP, IP and MP joints.  Denies morning stiffness.  No raynauds, rash, dysphagia, choking on food. She has had recurrent issues with gallstones, biliary obstruction status post cholecystectomy in 2017 and recently underwent ERCP on 3/9 and 04/07/18.  She reports that her symptoms of dyspnea improve whenever the gallstones are removed.  She has been on nitrofurantoin recently for cystitis but not on a prolonged basis.  Pets: No pets, birds, farm animals Occupation: Retired [employer] operational officer Exposures: No relevant exposures.  No leak, hot tub, Jacuzzi.  No down pillows or comforters Smoking history: Never smoker.  Exposed to secondhand smoke as a child no significant travel Travel history: No significant travel Relevant family history: No significant family history of lung issues.  Interim history: Discussed the use of AI scribe software for clinical note transcription with the patient, who gave verbal consent to proceed.  The patient is a 89 year old with a lung nodule who presents for follow-up.  She is being monitored for a lung nodule that was initially identified in a scan last year. The nodule has increased in size from 0.7  cm to 1.2 cm since the last scan in February. A PET scan conducted last year showed some activity. She has chosen a conservative approach, opting to monitor the nodule rather than pursue invasive procedures.  She experiences shortness of breath, which she manages with the use of a cane to aid in walking. Her history of two knee replacements and a hip replacement also contribute to her mobility challenges.  She has scarring in her lungs, likely due to age-related pulmonary fibrosis, but no treatment is being pursued for this condition.  Her family history is significant for cancer, with three family members having died from the disease.    Outpatient Encounter Medications as of 01/30/2024  Medication Sig   Calcium Carb-Cholecalciferol (CALCIUM CARBONATE-VITAMIN D3 PO) Take 1 tablet by mouth daily.    carvedilol (COREG) 12.5 MG tablet TAKE 1 TABLET BY MOUTH TWICE A DAY WITH FOOD   cholecalciferol (VITAMIN D) 1000 units tablet Take 1,000 Units by mouth daily.   ferrous sulfate 325 (65 FE) MG tablet Take 325 mg by mouth daily with breakfast.   losartan (COZAAR) 25 MG tablet Take 1 tablet (25 mg total) by mouth daily.   methenamine (HIPREX) 1 g tablet TAKE 1 TABLET (1 G TOTAL) BY MOUTH 2 (TWO) TIMES DAILY WITH A MEAL.   Multiple Vitamin (MULTIVITAMIN WITH MINERALS) TABS tablet Take 1 tablet by mouth daily.   Multiple Vitamins-Minerals (ZINC PO) Take by mouth.   predniSONE (DELTASONE) 10 MG tablet TAKE 1 TABLET (10 MG TOTAL) BY MOUTH DAILY WITH BREAKFAST.   torsemide (DEMADEX) 20 MG tablet TAKE 1 TABLET  BY MOUTH EVERY DAY   vitamin C (ASCORBIC ACID) 500 MG tablet Take 500 mg by mouth daily.   traMADol (ULTRAM) 50 MG tablet Take 1 tablet (50 mg total) by mouth every 12 (twelve) hours as needed. (Patient not taking: Reported on 01/30/2024)   No facility-administered encounter medications on file as of 01/30/2024.   Physical Exam: Blood pressure 130/68, pulse 79, height 5' 2.5" (1.588 m), weight 167 lb  (75.8 kg), SpO2 98%. Gen:      No acute distress HEENT:  EOMI, sclera anicteric Neck:     No masses; no thyromegaly Lungs:    Clear to auscultation bilaterally; normal respiratory effort CV:         Regular rate and rhythm; no murmurs Abd:      + bowel sounds; soft, non-tender; no palpable masses, no distension Ext:    No edema; adequate peripheral perfusion Skin:      Warm and dry; no rash Neuro: alert and oriented x 3 Psych: normal mood and affect   Data Reviewed: Imaging: CT chest 03/17/2018-mild atelectasis at the bases. Small left pleural effusion. High-resolution CT 05/08/2018- 7 mm nodule in the right upper lobe.  Minimal basilar groundglass attenuation, septal thickening.  High-resolution CT 06/18/2019- stable interstitial changes at the bases.  Enlarging right upper lobe pulmonary nodule now measuring 9 mm  PET scan 07/02/2019- no uptake in the right upper lobe nodule SUV 0.9 or anywhere else  CT chest 01/10/2020- minimal increase in right upper lobe nodule.  No evidence of interstitial lung disease  CT chest 01/13/2023- enlargement of right upper lobe nodule, basilar subpleural reticulation groundglass and traction suggestive of possible UIP  PET scan 01/30/2023-SUV 3.08, enlargement of lung nodule  CT scan 01/13/2023-right upper lobe lung nodule is increased to 1.2 cm, stable fibrotic changes, possible UIP pattern I had reviewed the images personally.   PFTs 04/03/2018 FVC 2.29 [95%], FEV1 1.79 [101%], F/F 78, TLC 86%, DLCO 51%, DLCO/VA 60% Isolated reduction in diffusion capacity.  Echocardiogram 02/18/2018- LVEF 65-70%, elevated LV filling pressure PA peak pressure 45, grade 1 diastolic dysfunction  Labs ANA, rheumatoid factor, CCP 04/27/2018-negative  Biodesix Nodify XL2 Test 07/15/2019 Pre test-17% risk of malignancy Posttest 5%, reduced risk of malignancy  Assessment:  Lung Nodule with Suspected Malignancy The lung nodule has increased in size from 0.7 cm to 1.2 cm  since the last scan in February. The PET scan from last year showed activity suggesting a low-grade malignancy. Given her age of 89 and family history of cancer, she prefers not to undergo invasive procedures or treatments such as biopsy, surgery, radiation, or chemotherapy due to the risks and her age. Risks of biopsy include procedural complications. She believes that surgery might lead to faster spread and prefers to focus on quality of life. The decision is to continue monitoring without intervention.  - No further scans unless symptoms worsen - Focus on symptom management and comfort  Pulmonary Fibrosis She has scarring in the lungs, likely due to pulmonary fibrosis. The condition is slow-growing and the treatment has significant side effects, which are not recommended given her age and overall health status. - No treatment for pulmonary fibrosis - Symptom management as needed  Goals of Care She has a DNR in place and has planned her funeral. She prefers not to undergo aggressive treatments and is focused on maintaining quality of life. She has a living will and does not wish to be on life support or in the ICU. - Ensure DNR  is on record - Encourage maintaining a living will  Follow-up - Follow up with primary care physician in three months - Pulmonology follow-up only if needed.   Plan/Recommendations: Follow up as needed  Chilton Greathouse MD  Pulmonary and Critical Care 01/30/2024, 10:07 AM  CC:  Etta Grandchild, MD   Pricilla Riffle MD

## 2024-01-30 NOTE — Patient Instructions (Signed)
 VISIT SUMMARY:  Today, we discussed the follow-up on your lung nodule, which has increased in size since the last scan. We also reviewed your pulmonary fibrosis and your goals of care, focusing on maintaining your quality of life.  YOUR PLAN:  -LUNG NODULE WITH SUSPECTED MALIGNANCY: A lung nodule is a small growth in the lung that can be benign or malignant. Your nodule has grown from 0.7 cm to 1.2 cm, and a previous PET scan suggested it might be a low-grade malignancy. Given your age and family history, you have chosen to avoid invasive procedures and focus on quality of life. We will continue to monitor the nodule without further scans unless your symptoms worsen. Our focus will be on managing your symptoms and ensuring your comfort.  -PULMONARY FIBROSIS: Pulmonary fibrosis is a condition where the lung tissue becomes scarred and stiff, making it difficult to breathe. This condition is likely age-related in your case. Given the slow progression and the significant side effects of treatment, we will not pursue treatment for this condition. We will manage any symptoms as they arise.  -GOALS OF CARE: You have expressed a preference for not undergoing aggressive treatments and focusing on quality of life. You have a Do Not Resuscitate (DNR) order and a living will in place, and you do not wish to be on life support or in the ICU. We will ensure your DNR is on record and encourage you to maintain your living will.  INSTRUCTIONS:  Please follow up with your primary care physician in three months. You will only need to see the pulmonologist if necessary.

## 2024-02-02 ENCOUNTER — Telehealth: Payer: Self-pay | Admitting: Internal Medicine

## 2024-02-02 LAB — FRUCTOSAMINE: Fructosamine: 292 umol/L — ABNORMAL HIGH (ref 205–285)

## 2024-02-02 NOTE — Telephone Encounter (Signed)
 Copied from CRM [id number]. Topic: Clinical - Medical Advice >> Feb 02, 2024 10:57 AM Orinda Kenner C wrote: Reason for CRM: Patient cannot get through MyChart, the screen is blank and tried to contact the MyChart help desk and cannot get through. Patient wants to let Dr. Yetta Barre know she would like to see a kidney doctor, please advise and call back  [phone].   ---  Appears referral to [facility] has already been placed.

## 2024-02-02 NOTE — Telephone Encounter (Signed)
 Per patient request asked to cancel appointment on 03/31.[name]

## 2024-02-04 NOTE — Telephone Encounter (Signed)
 Patient has been made aware that Dr. Yetta Barre has already placed a referral for her. She gave a verbal understanding.

## 2024-02-19 ENCOUNTER — Ambulatory Visit: Payer: Medicare Other

## 2024-02-19 VITALS — BP 130/60 | HR 72 | Ht 63.0 in | Wt 166.0 lb

## 2024-02-19 DIAGNOSIS — Z0000 Encounter for general adult medical examination without abnormal findings: Secondary | ICD-10-CM

## 2024-02-19 NOTE — Patient Instructions (Signed)
 Claudia Lara , Thank you for taking time to come for your Medicare Wellness Visit. I appreciate your ongoing commitment to your health goals. Please review the following plan we discussed and let me know if I can assist you in the future.   Referrals/Orders/Follow-Ups/Clinician Recommendations: It was nice to meet you today.  Keep up the good work.   This is a list of the screening recommended for you and due dates:  Health Maintenance  Topic Date Due   Medicare Annual Wellness Visit  09/27/2023   DTaP/Tdap/Td vaccine (2 - Tdap) 01/28/2025*   Pneumonia Vaccine  Completed   Flu Shot  Completed   DEXA scan (bone density measurement)  Completed   Zoster (Shingles) Vaccine  Completed   HPV Vaccine  Aged Out   COVID-19 Vaccine  Discontinued  *Topic was postponed. The date shown is not the original due date.    Advanced directives: (In Chart) A copy of your advanced directives are scanned into your chart should your provider ever need it.  Next Medicare Annual Wellness Visit scheduled for next year: Yes

## 2024-02-19 NOTE — Progress Notes (Signed)
 Subjective:   Claudia Lara is a 89 y.o. who presents for a Medicare Wellness preventive visit.  Visit Complete: In person   Persons Participating in Visit: Patient.  AWV Questionnaire: No: Patient Medicare AWV questionnaire was not completed prior to this visit.  Cardiac Risk Factors include: advanced age (>22men, >54 women)     Objective:    Today's Vitals   02/19/24 1432  BP: 130/60  Pulse: 72  SpO2: 98%  Weight: 166 lb (75.3 kg)  Height: 5\' 3"  (1.6 m)   Body mass index is 29.41 kg/m.     02/19/2024    2:14 PM 12/30/2023    5:20 PM 09/26/2022   11:29 AM 09/06/2021   12:12 PM 05/31/2020   10:59 AM 08/31/2019    9:04 AM 08/30/2019    4:19 PM  Advanced Directives  Does Patient Have a Medical Advance Directive? Yes No Yes Yes Yes Yes Yes  Type of Estate agent of [place];Living will  Healthcare Power of [place];Living will   Healthcare Power of [place];Living will Living will  Does patient want to make changes to medical advance directive? No - Patient declined   No - Patient declined No - Patient declined No - Patient declined   Copy of Healthcare Power of Attorney in Chart? Yes - validated most recent copy scanned in chart (See row information)  No - copy requested   No - copy requested     Current Medications (verified) Outpatient Encounter Medications as of 02/19/2024  Medication Sig   Calcium Carb-Cholecalciferol (CALCIUM CARBONATE-VITAMIN D3 PO) Take 1 tablet by mouth daily.    carvedilol (COREG) 12.5 MG tablet TAKE 1 TABLET BY MOUTH TWICE A DAY WITH FOOD   cholecalciferol (VITAMIN D) 1000 units tablet Take 1,000 Units by mouth daily.   ferrous sulfate 325 (65 FE) MG tablet Take 325 mg by mouth daily with breakfast.   losartan (COZAAR) 25 MG tablet Take 1 tablet (25 mg total) by mouth daily.   methenamine (HIPREX) 1 g tablet TAKE 1 TABLET (1 G TOTAL) BY MOUTH 2 (TWO) TIMES DAILY WITH A MEAL.   Multiple Vitamin (MULTIVITAMIN WITH MINERALS)  TABS tablet Take 1 tablet by mouth daily.   Multiple Vitamins-Minerals (ZINC PO) Take by mouth.   predniSONE (DELTASONE) 10 MG tablet TAKE 1 TABLET (10 MG TOTAL) BY MOUTH DAILY WITH BREAKFAST.   torsemide (DEMADEX) 20 MG tablet TAKE 1 TABLET BY MOUTH EVERY DAY   vitamin C (ASCORBIC ACID) 500 MG tablet Take 500 mg by mouth daily.   No facility-administered encounter medications on file as of 02/19/2024.    Allergies (verified) Sulfonamide derivatives   History: Past Medical History:  Diagnosis Date   ANEMIA 08/22/2009   Aortic stenosis    AR (aortic regurgitation) 01/2019   Mild to Moderate, Noted on ECHO   CAROTID ARTERY STENOSIS 01/16/2010   Complication of anesthesia    very hard to wake up from surgery   DYSPNEA ON EXERTION 11/16/2007   Gallstones    GEN OSTEOARTHROSIS INVOLVING MULTIPLE SITES 11/11/2008   GERD (gastroesophageal reflux disease)    Grade I diastolic dysfunction 01/2018   Noted on ECHO   Heart murmur    HEMORRHOIDS, INTERNAL    surgery was in the 90's (per patient)   History of blood transfusion    after hip and knee replacements   NECK PAIN, ACUTE 12/28/2009   PERIPHERAL EDEMA 10/06/2007   PONV (postoperative nausea and vomiting)    Pulmonary nodule  06/18/2019   noted on CT Chest    PVD 10/06/2007   Unspecified essential hypertension 10/19/2007   Past Surgical History:  Procedure Laterality Date   ABDOMINAL HYSTERECTOMY     BIOPSY  08/06/2019   Procedure: BIOPSY;  Surgeon: Iva Boop, MD;  Location: [facility];  Service: Gastroenterology;;   BUNIONECTOMY     bilat   CARPAL TUNNEL RELEASE     CHOLECYSTECTOMY  01/29/2016   at [facility]   CHOLECYSTECTOMY     ENDOSCOPIC RETROGRADE CHOLANGIOPANCREATOGRAPHY (ERCP) WITH PROPOFOL N/A 12/01/2015   Procedure: ENDOSCOPIC RETROGRADE CHOLANGIOPANCREATOGRAPHY (ERCP) WITH PROPOFOL;  Surgeon: Rachael Fee, MD;  Location: [facility];  Service: Endoscopy;  Laterality: N/A;   ENDOSCOPIC  RETROGRADE CHOLANGIOPANCREATOGRAPHY (ERCP) WITH PROPOFOL N/A 02/01/2016   Procedure: ENDOSCOPIC RETROGRADE CHOLANGIOPANCREATOGRAPHY (ERCP) WITH PROPOFOL;  Surgeon: Rachael Fee, MD;  Location: [facility];  Service: Endoscopy;  Laterality: N/A;   ENDOSCOPIC RETROGRADE CHOLANGIOPANCREATOGRAPHY (ERCP) WITH PROPOFOL N/A 04/07/2018   Procedure: ENDOSCOPIC RETROGRADE CHOLANGIOPANCREATOGRAPHY (ERCP) WITH PROPOFOL;  Surgeon: Rachael Fee, MD;  Location: [facility];  Service: Endoscopy;  Laterality: N/A;   ESOPHAGOGASTRODUODENOSCOPY (EGD) WITH PROPOFOL N/A 08/06/2019   Procedure: ESOPHAGOGASTRODUODENOSCOPY (EGD) WITH PROPOFOL;  Surgeon: Iva Boop, MD;  Location: [facility];  Service: Gastroenterology;  Laterality: N/A;   HAMMER TOE SURGERY     HEMORRHOID SURGERY     Hyperplastic colon polyps, removed  2007   By Dr. Evette Cristal   JOINT REPLACEMENT  2011   right knee   OOPHORECTOMY     REMOVAL OF STONES  04/07/2018   Procedure: REMOVAL OF STONES;  Surgeon: Rachael Fee, MD;  Location: [facility];  Service: Endoscopy;;   ROTATOR CUFF REPAIR  2004   left (Dr. Cleophas Dunker)   SPHINCTEROTOMY  04/07/2018   Procedure: Dennison Mascot;  Surgeon: Rachael Fee, MD;  Location: [facility];  Service: Endoscopy;;   TOTAL HIP ARTHROPLASTY Right 11/26/2016   Procedure: RIGHT TOTAL HIP ARTHROPLASTY;  Surgeon: Valeria Batman, MD;  Location: [facility] OR;  Service: Orthopedics;  Laterality: Right;   TOTAL KNEE ARTHROPLASTY Right    TOTAL KNEE ARTHROPLASTY Left 07/26/2019   Procedure: TOTAL KNEE ARTHROPLASTY;  Surgeon: Ollen Gross, MD;  Location: [facility];  Service: Orthopedics;  Laterality: Left;    Family History  Problem Relation Age of Onset   Ovarian cancer Mother    Cancer Mother    Leukemia Father    Lung cancer Father    Cancer Father    Cancer Brother    Diabetes Neg Hx    Heart disease Neg Hx    Hypertension Neg Hx    Social History   Socioeconomic History   Marital status:  Divorced    Spouse name: Not on file   Number of children: 1   Years of education: 12   Highest education level: Not on file  Occupational History   Occupation: Producer, television/film/video: RETIRED    Comment: retired  Tobacco Use   Smoking status: Never   Smokeless tobacco: Never  Vaping Use   Vaping status: Never Used  Substance and Sexual Activity   Alcohol use: No   Drug use: No   Sexual activity: Not Currently    Birth control/protection: Surgical  Other Topics Concern   Not on file  Social History Narrative   HSG. Married - divorced for many years.  Retired. Lives alone - very active and independent.      Lives alone.  Social Drivers of Corporate investment banker Strain: Low Risk  (02/19/2024)   Overall Financial Resource Strain (CARDIA)    Difficulty of Paying Living Expenses: Not very hard  Food Insecurity: No Food Insecurity (02/19/2024)   Hunger Vital Sign    Worried About Running Out of Food in the Last Year: Never true    Ran Out of Food in the Last Year: Never true  Transportation Needs: No Transportation Needs (02/19/2024)   PRAPARE - Administrator, Civil Service (Medical): No    Lack of Transportation (Non-Medical): No  Physical Activity: Unknown (02/19/2024)   Exercise Vital Sign    Days of Exercise per Week: Not on file    Minutes of Exercise per Session: 0 min  Stress: No Stress Concern Present (02/19/2024)   [employer] of Occupational Health - Occupational Stress Questionnaire    Feeling of Stress : Not at all  Social Connections: Moderately Integrated (02/19/2024)   Social Connection and Isolation Panel [NHANES]    Frequency of Communication with Friends and Family: More than three times a week    Frequency of Social Gatherings with Friends and Family: Twice a week    Attends Religious Services: More than 4 times per year    Active Member of [employer] or Organizations: Yes    Attends Banker Meetings: Never    Marital Status:  Divorced    Tobacco Counseling Counseling given: Not Answered    Clinical Intake:  Pre-visit preparation completed: Yes  Pain : No/denies pain        Lab Results  Component Value Date   HGBA1C 4.4 (L) 01/29/2024   HGBA1C 4.5 (L) 08/05/2023   HGBA1C 4.8 06/08/2019     How often do you need to have someone help you when you read instructions, pamphlets, or other written materials from your doctor or pharmacy?: 1 - Never  Interpreter Needed?: No  Information entered by :: Hawthorne Day, RMA   Activities of Daily Living     02/19/2024    2:12 PM  In your present state of health, do you have any difficulty performing the following activities:  Hearing? 0  Vision? 0  Difficulty concentrating or making decisions? 0  Walking or climbing stairs? 0  Dressing or bathing? 0  Doing errands, shopping? 0  Preparing Food and eating ? N  Using the Toilet? N  In the past six months, have you accidently leaked urine? N  Do you have problems with loss of bowel control? N  Managing your Medications? N  Managing your Finances? N  Housekeeping or managing your Housekeeping? N    Patient Care Team: Etta Grandchild, MD as PCP - General (Internal Medicine) Pricilla Riffle, MD as PCP - Cardiology (Cardiology) Rachael Fee, MD (Inactive) (Gastroenterology) Cassell Clement, MD as Consulting Physician (Cardiology) Valeria Batman, MD (Inactive) (Orthopedic Surgery) Kathyrn Sheriff, [facility] (Inactive) (Pharmacist) Nita Sells, MD as Consulting Physician (Dermatology)  Indicate any recent Medical Services you may have received from other than [facility] providers in the past year (date may be approximate).     Assessment:   This is a routine wellness examination for Kaitlyne.  Hearing/Vision screen Hearing Screening - Comments:: Denies hearing difficulties   Vision Screening - Comments:: Wears eyeglasses   Goals Addressed             This Visit's Progress    Stay Healthy.    On track      Depression Screen  02/19/2024    2:18 PM 07/08/2023    2:56 PM 01/15/2023    2:59 PM 11/12/2022    2:44 PM 09/26/2022   11:50 AM 09/06/2021   12:14 PM 07/19/2021    1:23 PM  PHQ 2/9 Scores  PHQ - 2 Score 0 0 0 0 0 0 0  PHQ- 9 Score 3  3        Fall Risk     02/19/2024    2:14 PM 07/08/2023    2:56 PM 01/15/2023    2:59 PM 11/12/2022    2:43 PM 09/26/2022   11:52 AM  Fall Risk   Falls in the past year? 0 0 0 1 1  Number falls in past yr: 0 0 0 0 1  Injury with Fall? 0 0 0 0 0  Risk for fall due to : No Fall Risks No Fall Risks No Fall Risks No Fall Risks   Follow up Falls prevention discussed;Falls evaluation completed Falls evaluation completed Falls evaluation completed Falls evaluation completed Falls evaluation completed;Falls prevention discussed    MEDICARE RISK AT HOME:  Medicare Risk at Home Any stairs in or around the home?: Yes If so, are there any without handrails?: Yes Home free of loose throw rugs in walkways, pet beds, electrical cords, etc?: Yes Adequate lighting in your home to reduce risk of falls?: Yes Life alert?: No Use of a cane, walker or w/c?: Yes (a cane at times) Grab bars in the bathroom?: Yes Shower chair or bench in shower?: No Elevated toilet seat or a handicapped toilet?: No  TIMED UP AND GO:  Was the test performed?  Yes  Length of time to ambulate 10 feet: 25 sec Gait slow and steady without use of assistive device  Cognitive Function: Normal: Normal cognitive status assessed by direct observation by this Clinical Health Advisor. No abnormalities found. Patient is able to answer questions in an accurate and timely manner.        09/26/2022   11:52 AM  6CIT Screen  What Year? 0 points  What month? 0 points  What time? 0 points  Count back from 20 0 points  Months in reverse 0 points  Repeat phrase 0 points  Total Score 0 points    Immunizations Immunization History  Administered Date(s) Administered   Fluad  Quad(high Dose 65+) 08/04/2019, 10/30/2020, 08/07/2022   Influenza Whole 08/26/2009, 08/25/2012   Influenza, High Dose Seasonal PF 08/03/2015, 08/01/2016, 07/31/2017, 09/01/2018, 07/18/2023   Influenza-Unspecified 07/26/2013, 07/28/2014, 08/12/2021   PFIZER(Purple Top)SARS-COV-2 Vaccination 02/26/2020, 03/22/2020, 11/15/2020   Pneumococcal Conjugate-13 02/01/2014   Pneumococcal Polysaccharide-23 05/15/2015, 07/19/2021   Td 07/16/2012   Zoster Recombinant(Shingrix) 04/19/2020, 10/21/2021   Zoster, Live 01/23/2013    Screening Tests Health Maintenance  Topic Date Due   DTaP/Tdap/Td (2 - Tdap) 01/28/2025 (Originally 07/16/2022)   Medicare Annual Wellness (AWV)  02/18/2025   Pneumonia Vaccine 40+ Years old  Completed   INFLUENZA VACCINE  Completed   DEXA SCAN  Completed   Zoster Vaccines- Shingrix  Completed   HPV VACCINES  Aged Out   COVID-19 Vaccine  Discontinued    Health Maintenance  There are no preventive care reminders to display for this patient.  Health Maintenance Items Addressed: See Nurse Notes  Additional Screening:  Vision Screening: Recommended annual ophthalmology exams for early detection of glaucoma and other disorders of the eye.  Dental Screening: Recommended annual dental exams for proper oral hygiene  Community Resource Referral / Chronic Care Management: CRR required  this visit?  No   CCM required this visit?  No     Plan:     I have personally reviewed and noted the following in the patient's chart:   Medical and social history Use of alcohol, tobacco or illicit drugs  Current medications and supplements including opioid prescriptions. Patient is not currently taking opioid prescriptions. Functional ability and status Nutritional status Physical activity Advanced directives List of other physicians Hospitalizations, surgeries, and ER visits in previous 12 months Vitals Screenings to include cognitive, depression, and falls Referrals and  appointments  In addition, I have reviewed and discussed with patient certain preventive protocols, quality metrics, and best practice recommendations. A written personalized care plan for preventive services as well as general preventive health recommendations were provided to patient.     Quientin Jent L Wanell Lorenzi, CMA   02/19/2024   After Visit Summary: (In Person-Printed) AVS printed and given to the patient  Notes: Nothing significant to report at this time.

## 2024-02-23 ENCOUNTER — Ambulatory Visit: Payer: Medicare Other | Admitting: Internal Medicine

## 2024-02-23 ENCOUNTER — Other Ambulatory Visit: Payer: Medicare Other

## 2024-02-29 ENCOUNTER — Other Ambulatory Visit: Payer: Self-pay | Admitting: Internal Medicine

## 2024-02-29 DIAGNOSIS — I10 Essential (primary) hypertension: Secondary | ICD-10-CM

## 2024-02-29 DIAGNOSIS — I5189 Other ill-defined heart diseases: Secondary | ICD-10-CM

## 2024-02-29 DIAGNOSIS — D591 Autoimmune hemolytic anemia, unspecified: Secondary | ICD-10-CM

## 2024-03-16 DIAGNOSIS — B078 Other viral warts: Secondary | ICD-10-CM | POA: Diagnosis not present

## 2024-03-29 ENCOUNTER — Other Ambulatory Visit: Payer: Self-pay | Admitting: Physician Assistant

## 2024-03-29 DIAGNOSIS — D591 Autoimmune hemolytic anemia, unspecified: Secondary | ICD-10-CM

## 2024-04-02 ENCOUNTER — Ambulatory Visit
Admission: RE | Admit: 2024-04-02 | Discharge: 2024-04-02 | Disposition: A | Discharge status: Home / Self Care | Source: Ambulatory Visit | Attending: Emergency Medicine

## 2024-04-02 ENCOUNTER — Ambulatory Visit: Payer: Self-pay

## 2024-04-02 VITALS — BP 144/64 | HR 81 | Temp 98.1°F | Resp 17

## 2024-04-02 DIAGNOSIS — R202 Paresthesia of skin: Secondary | ICD-10-CM | POA: Diagnosis not present

## 2024-04-02 DIAGNOSIS — R200 Anesthesia of skin: Secondary | ICD-10-CM

## 2024-04-02 DIAGNOSIS — M5412 Radiculopathy, cervical region: Secondary | ICD-10-CM

## 2024-04-02 NOTE — Telephone Encounter (Addendum)
  Chief Complaint: R arm tingling Symptoms: tingling, "pins and needles" Frequency: unknown Pertinent Negatives: Patient denies CP, SOB Disposition: [] ED /[x] Urgent Care (no appt availability in office) / [] Appointment(In office/virtual)/ []  [facility]/ [] Home Care/ [] Refused Recommended Disposition /[] [place] [facility] [facility]/ []  Follow-up with PCP Additional Notes: Pt c/o R arm tingling with unknown last normal. Pt denies any CP or SOB. Reports Pins and needles from shoulder to wrist that is intermittent and does not appreciate it while sleeping. Triager attempted to schedule with PCP, but no access. Scheduled patient per protocol on Apr 02, 2024 at [facility]. Patient verbalized understanding and to call back/911 with worsening symptoms.         Copied from CRM [id number]. Topic: Clinical - Red Word Triage >> Apr 02, 2024 10:03 AM Everlene Hobby D wrote: Patient would like to speak with a nurse she is having tingling in her right arm Reason for Disposition  [1] Tingling (e.g., pins and needles) of the face, arm / hand, or leg / foot on one side of the body AND [2] present now (Exceptions: Chronic or recurrent symptom lasting > 4 weeks; or tingling from known cause, such as: bumped elbow, carpal tunnel syndrome, pinched nerve, frostbite.)  Answer Assessment - Initial Assessment Questions 1. SYMPTOM: "What is the main symptom you are concerned about?" (e.g., weakness, numbness)     Tingling in R arm 2. ONSET: "When did this start?" (minutes, hours, days; while sleeping)     "I really don't know" but its been a while 3. LAST NORMAL: "When was the last time you (the patient) were normal (no symptoms)?"     unknown 4. PATTERN "Does this come and go, or has it been constant since it started?"  "Is it present now?"     Comes and goes 5. CARDIAC SYMPTOMS: "Have you had any of the following symptoms: chest pain, difficulty breathing, palpitations?"     Denies Reports "got a lot of fluid and I'm  taking fluid pills" 6. NEUROLOGIC SYMPTOMS: "Have you had any of the following symptoms: headache, dizziness, vision loss, double vision, changes in speech, unsteady on your feet?"     Occasional unsteadiness - a newer sx 7. OTHER SYMPTOMS: "Do you have any other symptoms?"     denies  Protocols used: Neurologic Deficit-A-AH

## 2024-04-02 NOTE — ED Provider Notes (Addendum)
 [facility]    CSN: [id number] Arrival date & time: 04/02/24  1433      History   Chief Complaint Chief Complaint  Patient presents with   Arm Injury    R ARM TINGLING - Entered by patient    HPI Claudia Lara is a 89 y.o. female.   Patient presents to clinic over concerns of intermittent right arm tingling that has been ongoing for the past 2 or 3 weeks.  Yesterday she mowed her backyard with her push mower.  Today she had some numbness and tingling from her shoulder to her wrist, she massage the area and it improved.  This has been ongoing, whenever she massages the area the numbness and tingling relevant.  She has been lifting some heavy bags of dirt recently and has to plant some flowers when she gets home today.  She does live alone.  Has not had any trauma, injury or falls.  Denies any chest pain or shortness of breath.  Denies blurred vision, weakness, dizziness or syncope.  Hx heart murmur, cervical DDD, aortic stenosis, AR, OA, HTN, CKD stage 4, spondylosis, HLD, carpal tunnel syndrome of left upper limb and IDA.   While in the room she had relaxed her right arm, felt some numbness and tingling and reposition to the armrest, numbness and tingling immediately improved.  The history is provided by the patient and medical records.  Arm Injury   Past Medical History:  Diagnosis Date   ANEMIA 08/22/2009   Aortic stenosis    AR (aortic regurgitation) 01/2019   Mild to Moderate, Noted on ECHO   CAROTID ARTERY STENOSIS 01/16/2010   Complication of anesthesia    very hard to wake up from surgery   DYSPNEA ON EXERTION 11/16/2007   Gallstones    GEN OSTEOARTHROSIS INVOLVING MULTIPLE SITES 11/11/2008   GERD (gastroesophageal reflux disease)    Grade I diastolic dysfunction 01/2018   Noted on ECHO   Heart murmur    HEMORRHOIDS, INTERNAL    surgery was in the 90's (per patient)   History of blood transfusion    after hip and knee replacements   NECK PAIN,  ACUTE 12/28/2009   PERIPHERAL EDEMA 10/06/2007   PONV (postoperative nausea and vomiting)    Pulmonary nodule 06/18/2019   noted on CT Chest    PVD 10/06/2007   Unspecified essential hypertension 10/19/2007    Patient Active Problem List   Diagnosis Date Noted   Epigastric pain 01/30/2024   Kidney disease, chronic, stage IV (GFR 15-29 ml/min) (HCC) 01/30/2024   Trochanteric bursitis, left hip 12/18/2023   Chronic hyperglycemia 08/05/2023   Encounter for general adult medical examination with abnormal findings 07/22/2021   Recurrent UTI (urinary tract infection) 07/13/2021   Estrogen deficiency 01/22/2021   OAB (overactive bladder) 11/01/2020   Carpal tunnel syndrome, left upper limb 04/05/2020   Iron deficiency anemia due to chronic blood loss 09/08/2019   Interstitial pulmonary disease (HCC) 09/08/2019   OA (osteoarthritis) of knee 07/26/2019   Lung nodule 06/24/2019   DDD (degenerative disc disease), cervical 04/16/2019   Zinc  deficiency 10/03/2017   Spondylosis, lumbar, with myelopathy 04/09/2017   GERD (gastroesophageal reflux disease) 08/03/2015   Hemolytic anemia (HCC) 07/12/2015   Chronic venous insufficiency 08/10/2014   Aortic stenosis, mild 06/29/2014   Left ventricular diastolic dysfunction, NYHA class 1 06/29/2014   Senile osteopenia 04/21/2014   Hyperlipidemia with target LDL less than 130 04/21/2014   Kidney disease, chronic, stage III (GFR 30-59  ml/min) (HCC) 04/21/2014   Carotid artery stenosis without cerebral infarction 01/16/2010   Essential hypertension 10/19/2007    Past Surgical History:  Procedure Laterality Date   ABDOMINAL HYSTERECTOMY     BIOPSY  08/06/2019   Procedure: BIOPSY;  Surgeon: Kenney Peacemaker, MD;  Location: [facility];  Service: Gastroenterology;;   Tillman Folks     bilat   CARPAL TUNNEL RELEASE     CHOLECYSTECTOMY  01/29/2016   at [facility]   CHOLECYSTECTOMY     ENDOSCOPIC RETROGRADE CHOLANGIOPANCREATOGRAPHY  (ERCP) WITH PROPOFOL  N/A 12/01/2015   Procedure: ENDOSCOPIC RETROGRADE CHOLANGIOPANCREATOGRAPHY (ERCP) WITH PROPOFOL ;  Surgeon: Janel Medford, MD;  Location: [facility];  Service: Endoscopy;  Laterality: N/A;   ENDOSCOPIC RETROGRADE CHOLANGIOPANCREATOGRAPHY (ERCP) WITH PROPOFOL  N/A 02/01/2016   Procedure: ENDOSCOPIC RETROGRADE CHOLANGIOPANCREATOGRAPHY (ERCP) WITH PROPOFOL ;  Surgeon: Janel Medford, MD;  Location: [facility];  Service: Endoscopy;  Laterality: N/A;   ENDOSCOPIC RETROGRADE CHOLANGIOPANCREATOGRAPHY (ERCP) WITH PROPOFOL  N/A 04/07/2018   Procedure: ENDOSCOPIC RETROGRADE CHOLANGIOPANCREATOGRAPHY (ERCP) WITH PROPOFOL ;  Surgeon: Janel Medford, MD;  Location: [facility];  Service: Endoscopy;  Laterality: N/A;   ESOPHAGOGASTRODUODENOSCOPY (EGD) WITH PROPOFOL  N/A 08/06/2019   Procedure: ESOPHAGOGASTRODUODENOSCOPY (EGD) WITH PROPOFOL ;  Surgeon: Kenney Peacemaker, MD;  Location: [facility];  Service: Gastroenterology;  Laterality: N/A;   HAMMER TOE SURGERY     HEMORRHOID SURGERY     Hyperplastic colon polyps, removed  2007   By Dr. Elsie Halo   JOINT REPLACEMENT  2011   right knee   OOPHORECTOMY     REMOVAL OF STONES  04/07/2018   Procedure: REMOVAL OF STONES;  Surgeon: Janel Medford, MD;  Location: [facility];  Service: Endoscopy;;   ROTATOR CUFF REPAIR  2004   left (Dr. Aviva Lemmings)   SPHINCTEROTOMY  04/07/2018   Procedure: [address];  Surgeon: Janel Medford, MD;  Location: [facility];  Service: Endoscopy;;   TOTAL HIP ARTHROPLASTY Right 11/26/2016   Procedure: RIGHT TOTAL HIP ARTHROPLASTY;  Surgeon: Shirlee Dotter, MD;  Location: [facility];  Service: Orthopedics;  Laterality: Right;   TOTAL KNEE ARTHROPLASTY Right    TOTAL KNEE ARTHROPLASTY Left 07/26/2019   Procedure: TOTAL KNEE ARTHROPLASTY;  Surgeon: Liliane Rei, MD;  Location: [facility];  Service: Orthopedics;  Laterality: Left;     OB History   No obstetric history on file.      Home Medications    Prior  to Admission medications   Medication Sig Start Date End Date Taking? Authorizing Provider  Calcium  Carb-Cholecalciferol  (CALCIUM  CARBONATE-VITAMIN D3 PO) Take 1 tablet by mouth daily.     [provider]  carvedilol  (COREG ) 12.5 MG tablet TAKE 1 TABLET BY MOUTH TWICE A DAY WITH FOOD 03/01/24   Arcadio Knuckles, MD  cholecalciferol  (VITAMIN D ) 1000 units tablet Take 1,000 Units by mouth daily.    [provider]  ferrous sulfate  325 (65 FE) MG tablet Take 325 mg by mouth daily with breakfast. 01/11/21   [provider]  losartan  (COZAAR ) 25 MG tablet Take 1 tablet (25 mg total) by mouth daily. 07/08/23   Elvira Hammersmith, MD  methenamine  (HIPREX ) 1 g tablet TAKE 1 TABLET (1 G TOTAL) BY MOUTH 2 (TWO) TIMES DAILY WITH A MEAL. 03/07/23   Arcadio Knuckles, MD  Multiple Vitamin (MULTIVITAMIN WITH MINERALS) TABS tablet Take 1 tablet by mouth daily.    [provider]  Multiple Vitamins-Minerals (ZINC  PO) Take by mouth.    [provider]  predniSONE  (DELTASONE )  10 MG tablet TAKE 1 TABLET (10 MG TOTAL) BY MOUTH DAILY WITH BREAKFAST. 03/29/24   Heilingoetter, Cassandra L, PA-C  torsemide  (DEMADEX ) 20 MG tablet TAKE 1 TABLET BY MOUTH EVERY DAY 03/07/23   Arcadio Knuckles, MD  vitamin C  (ASCORBIC ACID ) 500 MG tablet Take 500 mg by mouth daily.    [provider]    Family History Family History  Problem Relation Age of Onset   Ovarian cancer Mother    Cancer Mother    Leukemia Father    Lung cancer Father    Cancer Father    Cancer Brother    Diabetes Neg Hx    Heart disease Neg Hx    Hypertension Neg Hx     Social History Social History   Tobacco Use   Smoking status: Never   Smokeless tobacco: Never  Vaping Use   Vaping status: Never Used  Substance Use Topics   Alcohol  use: No   Drug use: No     Allergies   Sulfonamide derivatives   Review of Systems Review of Systems  Per HPI  Physical Exam Triage Vital Signs ED Triage  Vitals  Encounter Vitals Group     BP 04/02/24 1437 (!) 144/64     Systolic BP Percentile --      Diastolic BP Percentile --      Pulse Rate 04/02/24 1437 81     Resp 04/02/24 1437 17     Temp 04/02/24 1437 98.1 F (36.7 C)     Temp Source 04/02/24 1437 Oral     SpO2 04/02/24 1437 93 %     Weight --      Height --      Head Circumference --      Peak Flow --      Pain Score 04/02/24 1442 0     Pain Loc --      Pain Education --      Exclude from Growth Chart --    No data found.  Updated Vital Signs BP (!) 144/64 (BP Location: Right Arm)   Pulse 81   Temp 98.1 F (36.7 C) (Oral)   Resp 17   SpO2 93%   Visual Acuity Right Eye Distance:   Left Eye Distance:   Bilateral Distance:    Right Eye Near:   Left Eye Near:    Bilateral Near:     Physical Exam Vitals and nursing note reviewed.  Constitutional:      Appearance: Normal appearance.  HENT:     Head: Normocephalic and atraumatic.     Right Ear: External ear normal.     Left Ear: External ear normal.     Nose: Nose normal.     Mouth/Throat:     Mouth: Mucous membranes are moist.  Eyes:     Conjunctiva/sclera: Conjunctivae normal.  Cardiovascular:     Rate and Rhythm: Normal rate and regular rhythm.     Pulses: Normal pulses.     Heart sounds: Murmur heard.  Pulmonary:     Effort: Pulmonary effort is normal. No respiratory distress.     Breath sounds: Normal breath sounds. No wheezing.  Musculoskeletal:        General: No swelling or deformity. Normal range of motion.  Skin:    General: Skin is warm and dry.     Capillary Refill: Capillary refill takes less than 2 seconds.  Neurological:     General: No focal deficit present.     Mental  Status: She is alert.  Psychiatric:        Mood and Affect: Mood normal.        Behavior: Behavior is cooperative.      UC Treatments / Results  Labs (all labs ordered are listed, but only abnormal results are displayed) Labs Reviewed - No data to  display  EKG   Radiology No results found.  Procedures Procedures (including critical care time)  Medications Ordered in UC Medications - No data to display  Initial Impression / Assessment and Plan / UC Course  I have reviewed the triage vital signs and the nursing notes.  Pertinent labs & imaging results that were available during my care of the patient were reviewed by me and considered in my medical decision making (see chart for details).  Patient is alert and oriented, GCS 15.  Speech is clear.  Strength 5 out of 5 in upper extremities.  Without facial droop.  Lungs are vesicular, heart with regular rate and rhythm.  Radial pulse of the right side is 2+, brisk capillary refill in fingers.  Full range of motion of arm present, no current numbness, tingling or pain.  Without chest pain or shortness of breath.  EKG shows normal sinus rhythm without ST elevation or ST depression.  Patient is quite physically active for her age.  Suspect mild impingement vs cervical radiculopathy, reassuring that numbness and tingling improved with massage and that this has been ongoing for the past 3 weeks. Able to lift right arm fully above head without difficulty.   Strict emergency precautions given if symptoms evolve or worsen in any way, as patient would benefit from further advanced evaluation at the nearest emergency department to rule out MI, stroke, or other acute etiologies.  Patient agreeable to plan.  No questions at this time.    Final Clinical Impressions(s) / UC Diagnoses   Final diagnoses:  Numbness and tingling of right arm  Cervical radiculopathy     Discharge Instructions      Overall, your physical exam was reassuring.  You can use topical diclofenac gel to the area or take 500 mg of Tylenol  every 6-8 hours as needed for any pain.  Continue activities as tolerated.  Seek immediate care at the nearest emergency department for any weakness, dizziness, loss of  consciousness, headache, chest pain, shortness of breath, or new concerning symptoms as the emergency department has access to advanced imaging that would be beneficial to rule out emergent abnormalities.   ED Prescriptions   None    PDMP not reviewed this encounter.       Harlow Lighter, Ima Hafner  N, FNP 04/02/24 1508    Harlow Lighter, Rupa Lagan  N, FNP 04/02/24 1521

## 2024-04-02 NOTE — ED Triage Notes (Addendum)
 Pt presents with tingling in right arm for a few weeks that comes and goes. States its not painful.  Denies any weakness or other facial changes.   States she works out in her yard regularly and is not sure if she pulled something

## 2024-04-02 NOTE — Discharge Instructions (Signed)
 Overall, your physical exam was reassuring.  You can use topical diclofenac gel to the area or take 500 mg of Tylenol  every 6-8 hours as needed for any pain.  Continue activities as tolerated.  Seek immediate care at the nearest emergency department for any weakness, dizziness, loss of consciousness, headache, chest pain, shortness of breath, or new concerning symptoms as the emergency department has access to advanced imaging that would be beneficial to rule out emergent abnormalities.

## 2024-04-15 ENCOUNTER — Ambulatory Visit (INDEPENDENT_AMBULATORY_CARE_PROVIDER_SITE_OTHER): Admitting: Internal Medicine

## 2024-04-15 ENCOUNTER — Ambulatory Visit: Payer: Self-pay | Admitting: Internal Medicine

## 2024-04-15 ENCOUNTER — Encounter: Payer: Self-pay | Admitting: Internal Medicine

## 2024-04-15 ENCOUNTER — Ambulatory Visit: Payer: Self-pay | Admitting: *Deleted

## 2024-04-15 VITALS — BP 122/74 | HR 80 | Temp 98.8°F | Ht 63.0 in | Wt 171.0 lb

## 2024-04-15 DIAGNOSIS — D599 Acquired hemolytic anemia, unspecified: Secondary | ICD-10-CM | POA: Diagnosis not present

## 2024-04-15 DIAGNOSIS — N184 Chronic kidney disease, stage 4 (severe): Secondary | ICD-10-CM

## 2024-04-15 DIAGNOSIS — I5031 Acute diastolic (congestive) heart failure: Secondary | ICD-10-CM

## 2024-04-15 DIAGNOSIS — I10 Essential (primary) hypertension: Secondary | ICD-10-CM | POA: Diagnosis not present

## 2024-04-15 DIAGNOSIS — I5030 Unspecified diastolic (congestive) heart failure: Secondary | ICD-10-CM | POA: Insufficient documentation

## 2024-04-15 LAB — BASIC METABOLIC PANEL WITH GFR
BUN: 27 mg/dL — ABNORMAL HIGH (ref 6–23)
CO2: 29 meq/L (ref 19–32)
Calcium: 9.1 mg/dL (ref 8.4–10.5)
Chloride: 106 meq/L (ref 96–112)
Creatinine, Ser: 1.42 mg/dL — ABNORMAL HIGH (ref 0.40–1.20)
GFR: 32.66 mL/min — ABNORMAL LOW (ref >60.00)
Glucose, Bld: 101 mg/dL — ABNORMAL HIGH (ref 70–99)
Potassium: 3.9 meq/L (ref 3.5–5.1)
Sodium: 142 meq/L (ref 135–145)

## 2024-04-15 LAB — CBC WITH DIFFERENTIAL/PLATELET
Basophils Absolute: 0 10*3/uL (ref 0.0–0.1)
Basophils Relative: 0.6 % (ref 0.0–3.0)
Eosinophils Absolute: 0.2 10*3/uL (ref 0.0–0.7)
Eosinophils Relative: 2.7 % (ref 0.0–5.0)
HCT: 26.5 % — ABNORMAL LOW (ref 36.0–46.0)
Hemoglobin: 9.3 g/dL — ABNORMAL LOW (ref 12.0–15.0)
Lymphocytes Relative: 16.7 % (ref 12.0–46.0)
Lymphs Abs: 1.4 10*3/uL (ref 0.7–4.0)
MCHC: 35.1 g/dL (ref 30.0–36.0)
MCV: 88.1 fl (ref 78.0–100.0)
Monocytes Absolute: 0.8 10*3/uL (ref 0.1–1.0)
Monocytes Relative: 9.5 % (ref 3.0–12.0)
Neutro Abs: 5.8 10*3/uL (ref 1.4–7.7)
Neutrophils Relative %: 70.5 % (ref 43.0–77.0)
Platelets: 253 10*3/uL (ref 150.0–400.0)
RBC: 3.01 Mil/uL — ABNORMAL LOW (ref 3.87–5.11)
RDW: 20.5 % — ABNORMAL HIGH (ref 11.5–15.5)
WBC: 8.2 10*3/uL (ref 4.0–10.5)

## 2024-04-15 LAB — HEPATIC FUNCTION PANEL
ALT: 15 U/L (ref 0–35)
AST: 21 U/L (ref 0–37)
Albumin: 4.3 g/dL (ref 3.5–5.2)
Alkaline Phosphatase: 59 U/L (ref 39–117)
Bilirubin, Direct: 0.5 mg/dL — ABNORMAL HIGH (ref 0.0–0.3)
Total Bilirubin: 2.5 mg/dL — ABNORMAL HIGH (ref 0.2–1.2)
Total Protein: 6.4 g/dL (ref 6.0–8.3)

## 2024-04-15 MED ORDER — TORSEMIDE 20 MG PO TABS
ORAL_TABLET | ORAL | 1 refills | Status: AC
Start: 2024-04-15 — End: ?

## 2024-04-15 NOTE — Progress Notes (Signed)
 The test results show that your current treatment is OK, as the tests are stable.  Please continue the same plan.  There is no other need for change of treatment or further evaluation based on these results, at this time.  thanks

## 2024-04-15 NOTE — Assessment & Plan Note (Signed)
 Also for f/u lab today to check stability, but should have bmp rechecked next visit

## 2024-04-15 NOTE — Assessment & Plan Note (Signed)
 Also for cbc to r/o worsening anemia as cause for worsening leg edema

## 2024-04-15 NOTE — Telephone Encounter (Signed)
  Chief Complaint: bilateral leg swelling Symptoms: swelling to the knees, leakage of fluid from selling Frequency: chronic swelling- not this bad Pertinent Negatives: Patient denies no redness, pain reported Disposition: [] ED /[] Urgent Care (no appt availability in office) / [x] Appointment(In office/virtual)/ []  [facility]/ [] Home Care/ [] Refused Recommended Disposition /[] [facility]/ []  Follow-up with PCP Additional Notes: Patient state she has been doing a a lot of yard work and has been active. She states the swelling in her legs is worse than it has ever been    Copied from CRM [id number]. Topic: Clinical - Red Word Triage >> Apr 15, 2024 11:59 AM Kevelyn M wrote: Red Word that prompted transfer to Nurse Triage: Fluid gathering in legs and feet and patient thinks she has Phlebitis. She doesn't have an appointment until June 9th needs to be seen now. Reason for Disposition  [1] MODERATE leg swelling (e.g., swelling extends up to knees) AND [2] new-onset or worsening  Answer Assessment - Initial Assessment Questions 1. ONSET: "When did the swelling start?" (e.g., minutes, hours, days)     Chronic- left started then into right- seeping fluid 2. LOCATION: "What part of the leg is swollen?"  "Are both legs swollen or just one leg?"     Below the knee- both legs 3. SEVERITY: "How bad is the swelling?" (e.g., localized; mild, moderate, severe)   - Localized: Small area of swelling localized to one leg.   - MILD pedal edema: Swelling limited to foot and ankle, pitting edema < 1/4 inch (6 mm) deep, rest and elevation eliminate most or all swelling.   - MODERATE edema: Swelling of lower leg to knee, pitting edema > 1/4 inch (6 mm) deep, rest and elevation only partially reduce swelling.   - SEVERE edema: Swelling extends above knee, facial or hand swelling present.      moderate 4. REDNESS: "Does the swelling look red or infected?"     no 5. PAIN: "Is the swelling painful  to touch?" If Yes, ask: "How painful is it?"   (Scale 1-10; mild, moderate or severe)     no 6. FEVER: "Do you have a fever?" If Yes, ask: "What is it, how was it measured, and when did it start?"      no 7. CAUSE: "What do you think is causing the leg swelling?"     Fluid retention  8. MEDICAL HISTORY: "Do you have a history of blood clots (e.g., DVT), cancer, heart failure, kidney disease, or liver failure?"     no 9. RECURRENT SYMPTOM: "Have you had leg swelling before?" If Yes, ask: "When was the last time?" "What happened that time?"     Not like this 10. OTHER SYMPTOMS: "Do you have any other symptoms?" (e.g., chest pain, difficulty breathing)       no  Protocols used: Leg Swelling and Edema-A-AH

## 2024-04-15 NOTE — Patient Instructions (Addendum)
 Ok to increase the torsemide  to 2 per day  Please continue all other medications as before, and refills have been done if requested.  Please have the pharmacy call with any other refills you may need.  Please keep your appointments with your specialists as you may have planned - Dr Lavanda Porter jun 9, and renal June 10  Please go to the LAB at the blood drawing area for the tests to be done  You will be contacted by phone if any changes need to be made immediately.  Otherwise, you will receive a letter about your results with an explanation, but please check with MyChart first.  Please see Dr Rochelle Chu in 1-2 weeks

## 2024-04-15 NOTE — Assessment & Plan Note (Signed)
 BP Readings from Last 3 Encounters:  04/15/24 122/74  04/02/24 (!) 144/64  02/19/24 130/60   Stable, pt to continue medical treatment coreg  12.5 bid, losartan  25 qd

## 2024-04-15 NOTE — Assessment & Plan Note (Signed)
 Likely given age and recent echo , now with large increased leg edema and wt gain but o/w stable, pt to increase torsemide  to 20 mg x 2 per day, check daily wts, and rov in 1-2 wks

## 2024-04-15 NOTE — Progress Notes (Signed)
 Patient ID: SINCERITY CEDAR, female   DOB: [birth year], 89 y.o.   MRN: [id number]        Chief Complaint: follow up diastolic chf, ckd4, htn, hx of anemia       HPI:  Claudia Lara is a 89 y.o. female here with c/o 2 wks worsening leg swelling now severe with leaky fliud down the lateral left leg; Pt denies chest pain, increased sob or doe, wheezing, orthopnea, PND, palpitations, dizziness or syncope   Pt denies polydipsia, polyuria, or new focal neuro s/s.    Pt denies fever, wt loss, night sweats, loss of appetite, or other constitutional symptoms  Has known hx of severe ckd4 and hx of hemolytic anemia.  No overt bleeding. No falls. .       Wt Readings from Last 3 Encounters:  04/15/24 171 lb (77.6 kg)  02/19/24 166 lb (75.3 kg)  01/30/24 167 lb (75.8 kg)   BP Readings from Last 3 Encounters:  04/15/24 122/74  04/02/24 (!) 144/64  02/19/24 130/60         Past Medical History:  Diagnosis Date   ANEMIA 08/22/2009   Aortic stenosis    AR (aortic regurgitation) 01/2019   Mild to Moderate, Noted on ECHO   CAROTID ARTERY STENOSIS 01/16/2010   Complication of anesthesia    very hard to wake up from surgery   DYSPNEA ON EXERTION 11/16/2007   Gallstones    GEN OSTEOARTHROSIS INVOLVING MULTIPLE SITES 11/11/2008   GERD (gastroesophageal reflux disease)    Grade I diastolic dysfunction 01/2018   Noted on ECHO   Heart murmur    HEMORRHOIDS, INTERNAL    surgery was in the 90's (per patient)   History of blood transfusion    after hip and knee replacements   NECK PAIN, ACUTE 12/28/2009   PERIPHERAL EDEMA 10/06/2007   PONV (postoperative nausea and vomiting)    Pulmonary nodule 06/18/2019   noted on CT Chest    PVD 10/06/2007   Unspecified essential hypertension 10/19/2007   Past Surgical History:  Procedure Laterality Date   ABDOMINAL HYSTERECTOMY     BIOPSY  08/06/2019   Procedure: BIOPSY;  Surgeon: Kenney Peacemaker, MD;  Location: [facility];  Service: Gastroenterology;;    Tillman Folks     bilat   CARPAL TUNNEL RELEASE     CHOLECYSTECTOMY  01/29/2016   at [facility]   CHOLECYSTECTOMY     ENDOSCOPIC RETROGRADE CHOLANGIOPANCREATOGRAPHY (ERCP) WITH PROPOFOL  N/A 12/01/2015   Procedure: ENDOSCOPIC RETROGRADE CHOLANGIOPANCREATOGRAPHY (ERCP) WITH PROPOFOL ;  Surgeon: Janel Medford, MD;  Location: [facility];  Service: Endoscopy;  Laterality: N/A;   ENDOSCOPIC RETROGRADE CHOLANGIOPANCREATOGRAPHY (ERCP) WITH PROPOFOL  N/A 02/01/2016   Procedure: ENDOSCOPIC RETROGRADE CHOLANGIOPANCREATOGRAPHY (ERCP) WITH PROPOFOL ;  Surgeon: Janel Medford, MD;  Location: [facility];  Service: Endoscopy;  Laterality: N/A;   ENDOSCOPIC RETROGRADE CHOLANGIOPANCREATOGRAPHY (ERCP) WITH PROPOFOL  N/A 04/07/2018   Procedure: ENDOSCOPIC RETROGRADE CHOLANGIOPANCREATOGRAPHY (ERCP) WITH PROPOFOL ;  Surgeon: Janel Medford, MD;  Location: [facility];  Service: Endoscopy;  Laterality: N/A;   ESOPHAGOGASTRODUODENOSCOPY (EGD) WITH PROPOFOL  N/A 08/06/2019   Procedure: ESOPHAGOGASTRODUODENOSCOPY (EGD) WITH PROPOFOL ;  Surgeon: Kenney Peacemaker, MD;  Location: [facility];  Service: Gastroenterology;  Laterality: N/A;   HAMMER TOE SURGERY     HEMORRHOID SURGERY     Hyperplastic colon polyps, removed  2007   By Dr. Elsie Halo   JOINT REPLACEMENT  2011   right knee   OOPHORECTOMY     REMOVAL OF STONES  04/07/2018  Procedure: REMOVAL OF STONES;  Surgeon: Janel Medford, MD;  Location: [facility];  Service: Endoscopy;;   ROTATOR CUFF REPAIR  2004   left (Dr. Aviva Lemmings)   SPHINCTEROTOMY  04/07/2018   Procedure: [address];  Surgeon: Janel Medford, MD;  Location: [facility];  Service: Endoscopy;;   TOTAL HIP ARTHROPLASTY Right 11/26/2016   Procedure: RIGHT TOTAL HIP ARTHROPLASTY;  Surgeon: Shirlee Dotter, MD;  Location: [facility];  Service: Orthopedics;  Laterality: Right;   TOTAL KNEE ARTHROPLASTY Right    TOTAL KNEE ARTHROPLASTY Left 07/26/2019   Procedure: TOTAL KNEE ARTHROPLASTY;   Surgeon: Liliane Rei, MD;  Location: [facility];  Service: Orthopedics;  Laterality: Left;     reports that she has never smoked. She has never used smokeless tobacco. She reports that she does not drink alcohol  and does not use drugs. family history includes Cancer in her brother, father, and mother; Leukemia in her father; Lung cancer in her father; Ovarian cancer in her mother. Allergies  Allergen Reactions   Sulfonamide Derivatives Rash    Childhood reaction   Current Outpatient Medications on File Prior to Visit  Medication Sig Dispense Refill   Calcium  Carb-Cholecalciferol  (CALCIUM  CARBONATE-VITAMIN D3 PO) Take 1 tablet by mouth daily.      carvedilol  (COREG ) 12.5 MG tablet TAKE 1 TABLET BY MOUTH TWICE A DAY WITH FOOD 180 tablet 1   cholecalciferol  (VITAMIN D ) 1000 units tablet Take 1,000 Units by mouth daily.     ferrous sulfate  325 (65 FE) MG tablet Take 325 mg by mouth daily with breakfast.     losartan  (COZAAR ) 25 MG tablet Take 1 tablet (25 mg total) by mouth daily. 90 tablet 3   methenamine  (HIPREX ) 1 g tablet TAKE 1 TABLET (1 G TOTAL) BY MOUTH 2 (TWO) TIMES DAILY WITH A MEAL. 180 tablet 1   Multiple Vitamin (MULTIVITAMIN WITH MINERALS) TABS tablet Take 1 tablet by mouth daily.     Multiple Vitamins-Minerals (ZINC  PO) Take by mouth.     predniSONE  (DELTASONE ) 10 MG tablet TAKE 1 TABLET (10 MG TOTAL) BY MOUTH DAILY WITH BREAKFAST. 30 tablet 1   vitamin C  (ASCORBIC ACID ) 500 MG tablet Take 500 mg by mouth daily.     No current facility-administered medications on file prior to visit.        ROS:  All others reviewed and negative.  Objective        PE:  BP 122/74 (BP Location: Right Arm, Patient Position: Sitting, Cuff Size: Normal)   Pulse 80   Temp 98.8 F (37.1 C) (Oral)   Ht 5\' 3"  (1.6 m)   Wt 171 lb (77.6 kg)   SpO2 95%   BMI 30.29 kg/m                 Constitutional: Pt appears in NAD               HENT: Head: NCAT.                Right Ear: External ear  normal.                 Left Ear: External ear normal.                Eyes: . Pupils are equal, round, and reactive to light. Conjunctivae and EOM are normal               Nose: without d/c or deformity  Neck: Neck supple. Gross normal ROM               Cardiovascular: Normal rate and regular rhythm.                 Pulmonary/Chest: Effort normal and breath sounds without rales or wheezing.                Abd:  Soft, NT, ND, + BS, no organomegaly               Neurological: Pt is alert. At baseline orientation, motor grossly intact               Skin: Skin is warm. No rashes, no other new lesions, LE edema - 3+ edema bilateral to knees with wetness fluid to left lateral leg but no open ulcers               Psychiatric: Pt behavior is normal without agitation   Micro: none  Cardiac tracings I have personally interpreted today:  none  Pertinent Radiological findings (summarize): none   Lab Results  Component Value Date   WBC 8.2 04/15/2024   HGB 9.3 (L) 04/15/2024   HCT 26.5 (L) 04/15/2024   PLT 253.0 04/15/2024   GLUCOSE 101 (H) 04/15/2024   CHOL 132 11/12/2022   TRIG 120.0 11/12/2022   HDL 53.90 11/12/2022   LDLDIRECT 56.0 07/08/2018   LDLCALC 54 11/12/2022   ALT 15 04/15/2024   AST 21 04/15/2024   NA 142 04/15/2024   K 3.9 04/15/2024   CL 106 04/15/2024   CREATININE 1.42 (H) 04/15/2024   BUN 27 (H) 04/15/2024   CO2 29 04/15/2024   TSH 0.90 02/13/2023   INR 1.1 (H) 05/17/2020   HGBA1C 4.4 (L) 01/29/2024   Assessment/Plan:  Claudia Lara is a 89 y.o. White or Caucasian [1] female with  has a past medical history of ANEMIA (08/22/2009), Aortic stenosis, AR (aortic regurgitation) (01/2019), CAROTID ARTERY STENOSIS (01/16/2010), Complication of anesthesia, DYSPNEA ON EXERTION (11/16/2007), Gallstones, GEN OSTEOARTHROSIS INVOLVING MULTIPLE SITES (11/11/2008), GERD (gastroesophageal reflux disease), Grade I diastolic dysfunction (01/2018), Heart murmur, HEMORRHOIDS,  INTERNAL, History of blood transfusion, NECK PAIN, ACUTE (12/28/2009), PERIPHERAL EDEMA (10/06/2007), PONV (postoperative nausea and vomiting), Pulmonary nodule (06/18/2019), PVD (10/06/2007), and Unspecified essential hypertension (10/19/2007).  Hemolytic anemia (HCC) Also for cbc to r/o worsening anemia as cause for worsening leg edema  Kidney disease, chronic, stage IV (GFR 15-29 ml/min) (HCC) Also for f/u lab today to check stability, but should have bmp rechecked next visit  Essential hypertension BP Readings from Last 3 Encounters:  04/15/24 122/74  04/02/24 (!) 144/64  02/19/24 130/60   Stable, pt to continue medical treatment coreg  12.5 bid, losartan  25 qd   Diastolic CHF (HCC) Likely given age and recent echo , now with large increased leg edema and wt gain but o/w stable, pt to increase torsemide  to 20 mg x 2 per day, check daily wts, and rov in 1-2 wks  Followup: Return in about 1 week (around 04/22/2024).  Rosalia Colonel, MD 04/15/2024 8:24 PM [facility]

## 2024-05-03 ENCOUNTER — Ambulatory Visit: Payer: Self-pay | Admitting: Internal Medicine

## 2024-05-03 ENCOUNTER — Encounter: Payer: Self-pay | Admitting: Internal Medicine

## 2024-05-03 ENCOUNTER — Ambulatory Visit (INDEPENDENT_AMBULATORY_CARE_PROVIDER_SITE_OTHER): Admitting: Internal Medicine

## 2024-05-03 ENCOUNTER — Other Ambulatory Visit: Payer: Self-pay | Admitting: Internal Medicine

## 2024-05-03 VITALS — BP 160/68 | HR 74 | Temp 98.9°F | Resp 16 | Ht 63.0 in | Wt 167.0 lb

## 2024-05-03 DIAGNOSIS — M10372 Gout due to renal impairment, left ankle and foot: Secondary | ICD-10-CM | POA: Diagnosis not present

## 2024-05-03 DIAGNOSIS — I5031 Acute diastolic (congestive) heart failure: Secondary | ICD-10-CM

## 2024-05-03 DIAGNOSIS — I10 Essential (primary) hypertension: Secondary | ICD-10-CM

## 2024-05-03 DIAGNOSIS — N184 Chronic kidney disease, stage 4 (severe): Secondary | ICD-10-CM

## 2024-05-03 DIAGNOSIS — I350 Nonrheumatic aortic (valve) stenosis: Secondary | ICD-10-CM

## 2024-05-03 DIAGNOSIS — E785 Hyperlipidemia, unspecified: Secondary | ICD-10-CM

## 2024-05-03 LAB — CBC WITH DIFFERENTIAL/PLATELET
Basophils Absolute: 0.1 10*3/uL (ref 0.0–0.1)
Basophils Relative: 0.8 % (ref 0.0–3.0)
Eosinophils Absolute: 0.2 10*3/uL (ref 0.0–0.7)
Eosinophils Relative: 2 % (ref 0.0–5.0)
HCT: 27.6 % — ABNORMAL LOW (ref 36.0–46.0)
Hemoglobin: 9.6 g/dL — ABNORMAL LOW (ref 12.0–15.0)
Lymphocytes Relative: 13.7 % (ref 12.0–46.0)
Lymphs Abs: 1.4 10*3/uL (ref 0.7–4.0)
MCHC: 34.7 g/dL (ref 30.0–36.0)
MCV: 88.8 fl (ref 78.0–100.0)
Monocytes Absolute: 0.8 10*3/uL (ref 0.1–1.0)
Monocytes Relative: 7.5 % (ref 3.0–12.0)
Neutro Abs: 7.6 10*3/uL (ref 1.4–7.7)
Neutrophils Relative %: 76 % (ref 43.0–77.0)
Platelets: 266 10*3/uL (ref 150.0–400.0)
RBC: 3.1 Mil/uL — ABNORMAL LOW (ref 3.87–5.11)
RDW: 20.7 % — ABNORMAL HIGH (ref 11.5–15.5)
WBC: 10 10*3/uL (ref 4.0–10.5)

## 2024-05-03 LAB — BRAIN NATRIURETIC PEPTIDE: Brain Natriuretic Peptide: 353 pg/mL — ABNORMAL HIGH (ref <100)

## 2024-05-03 LAB — URIC ACID: Uric Acid, Serum: 11.1 mg/dL — ABNORMAL HIGH (ref 2.4–7.0)

## 2024-05-03 NOTE — Patient Instructions (Signed)

## 2024-05-03 NOTE — Progress Notes (Signed)
 Subjective:  Patient ID: Claudia Lara, female    DOB: 1932-09-07  Age: 89 y.o. MRN: [id number]  CC: Hypertension, Anemia, and Congestive Heart Failure   HPI Claudia Lara presents for a CPX and f/up ----  Discussed the use of AI scribe software for clinical note transcription with the patient, who gave verbal consent to proceed.  History of Present Illness   Claudia Lara is a 89 year old female who presents with leg swelling and fluid weeping from her feet.  She experienced significant leg swelling, particularly in her feet, with fluid weeping from the skin. Initially, her dose of torsemide  was doubled, which led to significant fatigue and lack of energy. She felt unable to move for a week due to the fatigue, but today she feels back to normal.  No chest pain or shortness of breath. She initially suspected phlebitis but notes that her legs were not red or painful, just swollen. She recalls her grandmother having phlebitis when she was young.  She also mentions soreness in her second toe, which she did not injure and was filled with fluid, although it did not weep like her other foot. The other side is unaffected.       Outpatient Medications Prior to Visit  Medication Sig Dispense Refill   Calcium  Carb-Cholecalciferol  (CALCIUM  CARBONATE-VITAMIN D3 PO) Take 1 tablet by mouth daily.      carvedilol  (COREG ) 12.5 MG tablet TAKE 1 TABLET BY MOUTH TWICE A DAY WITH FOOD 180 tablet 1   cholecalciferol  (VITAMIN D ) 1000 units tablet Take 1,000 Units by mouth daily.     ferrous sulfate  325 (65 FE) MG tablet Take 325 mg by mouth daily with breakfast.     losartan  (COZAAR ) 25 MG tablet Take 1 tablet (25 mg total) by mouth daily. 90 tablet 3   methenamine  (HIPREX ) 1 g tablet TAKE 1 TABLET (1 G TOTAL) BY MOUTH 2 (TWO) TIMES DAILY WITH A MEAL. 180 tablet 1   Multiple Vitamin (MULTIVITAMIN WITH MINERALS) TABS tablet Take 1 tablet by mouth daily.     Multiple Vitamins-Minerals (ZINC  PO)  Take by mouth.     predniSONE  (DELTASONE ) 10 MG tablet TAKE 1 TABLET (10 MG TOTAL) BY MOUTH DAILY WITH BREAKFAST. 30 tablet 1   torsemide  (DEMADEX ) 20 MG tablet TAKE 2 TABLET BY MOUTH once EVERY DAY 180 tablet 1   vitamin C  (ASCORBIC ACID ) 500 MG tablet Take 500 mg by mouth daily.     No facility-administered medications prior to visit.    ROS Review of Systems  Constitutional:  Positive for fatigue. Negative for appetite change, chills, diaphoresis and unexpected weight change.  HENT: Negative.    Eyes: Negative.   Respiratory:  Negative for cough, chest tightness, shortness of breath and wheezing.   Cardiovascular:  Positive for leg swelling. Negative for chest pain and palpitations.  Gastrointestinal:  Negative for abdominal pain, constipation, diarrhea, nausea and vomiting.  Genitourinary: Negative.  Negative for difficulty urinating.  Musculoskeletal:  Positive for joint swelling. Negative for arthralgias.  Skin: Negative.   Neurological: Negative.  Negative for dizziness and weakness.  Hematological:  Negative for adenopathy. Does not bruise/bleed easily.  Psychiatric/Behavioral: Negative.      Objective:  BP (!) 160/68 (BP Location: Right Arm, Patient Position: Sitting, Cuff Size: Normal)   Pulse 74   Temp 98.9 F (37.2 C) (Oral)   Resp 16   Ht 5' 3 (1.6 m)   Wt 167 lb (  75.8 kg)   SpO2 98%   BMI 29.58 kg/m   BP Readings from Last 3 Encounters:  05/03/24 (!) 160/68  04/15/24 122/74  04/02/24 (!) 144/64         Wt Readings from Last 3 Encounters:  05/03/24 167 lb (75.8 kg)  04/15/24 171 lb (77.6 kg)  02/19/24 166 lb (75.3 kg)    Physical Exam Vitals reviewed.  Constitutional:      Appearance: Normal appearance.  HENT:     Nose: Nose normal.     Mouth/Throat:     Mouth: Mucous membranes are moist.   Eyes:     General: No scleral icterus.    Conjunctiva/sclera: Conjunctivae normal.    Cardiovascular:     Rate and Rhythm: Normal rate and regular  rhythm.     Heart sounds: Murmur heard.     Systolic murmur is present with a grade of 3/6.     No friction rub. No gallop.  Pulmonary:     Breath sounds: No stridor. No wheezing, rhonchi or rales.  Abdominal:     General: Abdomen is flat.     Palpations: There is no mass.     Tenderness: There is no abdominal tenderness. There is no guarding.     Hernia: No hernia is present.   Musculoskeletal:     Cervical back: Neck supple.     Right lower leg: 2+ Pitting Edema present.     Left lower leg: 2+ Pitting Edema present.  Lymphadenopathy:     Cervical: No cervical adenopathy.   Skin:    Coloration: Skin is pale. Skin is not jaundiced.   Neurological:     General: No focal deficit present.     Mental Status: She is alert.   Psychiatric:        Mood and Affect: Mood normal.        Behavior: Behavior normal.     Lab Results  Component Value Date   WBC 10.0 05/03/2024   HGB 9.6 (L) 05/03/2024   HCT 27.6 (L) 05/03/2024   PLT 266.0 05/03/2024   GLUCOSE 101 (H) 04/15/2024   CHOL 132 11/12/2022   TRIG 120.0 11/12/2022   HDL 53.90 11/12/2022   LDLDIRECT 56.0 07/08/2018   LDLCALC 54 11/12/2022   ALT 15 04/15/2024   AST 21 04/15/2024   NA 142 04/15/2024   K 3.9 04/15/2024   CL 106 04/15/2024   CREATININE 1.42 (H) 04/15/2024   BUN 27 (H) 04/15/2024   CO2 29 04/15/2024   TSH 1.19 05/03/2024   INR 1.1 (H) 05/17/2020   HGBA1C 4.4 (L) 01/29/2024    No results found.  Assessment & Plan:  Acute diastolic congestive heart failure (HCC)- BNP is elevated. Will evaluate with an ECHO. -     Brain natriuretic peptide; Future -     ECHOCARDIOGRAM COMPLETE; Future  Essential hypertension -     TSH; Future -     CBC with Differential/Platelet; Future  Kidney disease, chronic, stage IV (GFR 15-29 ml/min) (HCC) -     CBC with Differential/Platelet; Future -     Uric acid; Future  Aortic stenosis, mild -     ECHOCARDIOGRAM COMPLETE; Future  Acute gout due to renal impairment  involving toe of left foot -     Colchicine ; Take 1 tablet (0.6 mg total) by mouth daily.  Dispense: 30 tablet; Refill: 0     Follow-up: Return in about 3 months (around 08/03/2024).  Sandra Crouch, MD

## 2024-05-04 DIAGNOSIS — N184 Chronic kidney disease, stage 4 (severe): Secondary | ICD-10-CM | POA: Diagnosis not present

## 2024-05-04 DIAGNOSIS — I129 Hypertensive chronic kidney disease with stage 1 through stage 4 chronic kidney disease, or unspecified chronic kidney disease: Secondary | ICD-10-CM | POA: Diagnosis not present

## 2024-05-04 DIAGNOSIS — D631 Anemia in chronic kidney disease: Secondary | ICD-10-CM | POA: Diagnosis not present

## 2024-05-04 DIAGNOSIS — N2581 Secondary hyperparathyroidism of renal origin: Secondary | ICD-10-CM | POA: Diagnosis not present

## 2024-05-04 DIAGNOSIS — N390 Urinary tract infection, site not specified: Secondary | ICD-10-CM | POA: Diagnosis not present

## 2024-05-04 DIAGNOSIS — N189 Chronic kidney disease, unspecified: Secondary | ICD-10-CM | POA: Diagnosis not present

## 2024-05-04 DIAGNOSIS — N1832 Chronic kidney disease, stage 3b: Secondary | ICD-10-CM | POA: Diagnosis not present

## 2024-05-04 LAB — LAB REPORT - SCANNED: EGFR: 39

## 2024-05-05 ENCOUNTER — Other Ambulatory Visit (HOSPITAL_COMMUNITY): Payer: Self-pay

## 2024-05-06 DIAGNOSIS — M10372 Gout due to renal impairment, left ankle and foot: Secondary | ICD-10-CM | POA: Insufficient documentation

## 2024-05-06 LAB — TSH: TSH: 1.19 u[IU]/mL (ref 0.35–5.50)

## 2024-05-06 MED ORDER — COLCHICINE 0.6 MG PO TABS: 0.6000 mg | ORAL_TABLET | Freq: Every day | ORAL | 0 refills | Status: DC

## 2024-05-13 ENCOUNTER — Ambulatory Visit (HOSPITAL_COMMUNITY)
Admission: RE | Admit: 2024-05-13 | Discharge: 2024-05-13 | Disposition: A | Discharge status: Home / Self Care | Source: Ambulatory Visit | Attending: Cardiology | Admitting: Cardiology

## 2024-05-13 DIAGNOSIS — I350 Nonrheumatic aortic (valve) stenosis: Secondary | ICD-10-CM

## 2024-05-13 DIAGNOSIS — I5031 Acute diastolic (congestive) heart failure: Secondary | ICD-10-CM | POA: Diagnosis not present

## 2024-05-13 LAB — ECHOCARDIOGRAM COMPLETE
AR max vel: 1.13 cm2
AV Area VTI: 1.14 cm2
AV Area mean vel: 1.08 cm2
AV Mean grad: 16 mmHg
AV Peak grad: 26.2 mmHg
Ao pk vel: 2.56 m/s
Area-P 1/2: 3.11 cm2
MV M vel: 5.25 m/s
MV Peak grad: 110.3 mmHg
S' Lateral: 3.1 cm

## 2024-05-29 ENCOUNTER — Other Ambulatory Visit: Payer: Self-pay | Admitting: Internal Medicine

## 2024-05-29 DIAGNOSIS — M10372 Gout due to renal impairment, left ankle and foot: Secondary | ICD-10-CM

## 2024-06-02 DIAGNOSIS — X32XXXD Exposure to sunlight, subsequent encounter: Secondary | ICD-10-CM | POA: Diagnosis not present

## 2024-06-02 DIAGNOSIS — L570 Actinic keratosis: Secondary | ICD-10-CM | POA: Diagnosis not present

## 2024-06-02 DIAGNOSIS — L820 Inflamed seborrheic keratosis: Secondary | ICD-10-CM | POA: Diagnosis not present

## 2024-06-28 ENCOUNTER — Ambulatory Visit: Payer: Self-pay | Admitting: *Deleted

## 2024-06-28 ENCOUNTER — Ambulatory Visit (INDEPENDENT_AMBULATORY_CARE_PROVIDER_SITE_OTHER): Admitting: Family Medicine

## 2024-06-28 ENCOUNTER — Encounter: Payer: Self-pay | Admitting: Family Medicine

## 2024-06-28 VITALS — BP 126/50 | HR 78 | Temp 98.7°F | Resp 18 | Ht 63.0 in | Wt 163.0 lb

## 2024-06-28 DIAGNOSIS — N3001 Acute cystitis with hematuria: Secondary | ICD-10-CM | POA: Diagnosis not present

## 2024-06-28 LAB — POCT URINALYSIS DIPSTICK
Bilirubin, UA: NEGATIVE
Blood, UA: POSITIVE
Glucose, UA: NEGATIVE
Ketones, UA: NEGATIVE
Nitrite, UA: POSITIVE
Protein, UA: POSITIVE — AB
Spec Grav, UA: 1.01 (ref 1.010–1.025)
Urobilinogen, UA: NEGATIVE U/dL — AB
pH, UA: 6 (ref 5.0–8.0)

## 2024-06-28 MED ORDER — CEFDINIR 300 MG PO CAPS: 300.0000 mg | ORAL_CAPSULE | Freq: Two times a day (BID) | ORAL | 0 refills | Status: AC

## 2024-06-28 NOTE — Telephone Encounter (Signed)
 Patient has been scheduled to be seen in office today.

## 2024-06-28 NOTE — Telephone Encounter (Signed)
 Copied from CRM [id number]. Topic: Clinical - Red Word Triage >> Jun 28, 2024  8:04 AM [place] MATSU wrote: Red Word that prompted transfer to Nurse Triage: burning with urination, patient states she has UTI symptoms. Reason for Disposition  Urinating more frequently than usual (i.e., frequency) OR new-onset of the feeling of an urgent need to urinate (i.e., urgency)  Answer Assessment - Initial Assessment Questions 1. SYMPTOM: What's the main symptom you're concerned about? (e.g., frequency, incontinence)     I'm having burning with urination.  I get UTIs all the time.   I have another one.   2. ONSET: When did the  burning  start?     A few days ago.   I was up and down all night last night.  Frequency. 3. PAIN: Is there any pain? If Yes, ask: How bad is it? (Scale: 1-10; mild, moderate, severe)     Yes 4. CAUSE: What do you think is causing the symptoms?     UTI 5. OTHER SYMPTOMS: Do you have any other symptoms? (e.g., blood in urine, fever, flank pain, pain with urination)     Just burning with urination, and urgency  6. PREGNANCY: Is there any chance you are pregnant? When was your last menstrual period?     N/A due to age  Protocols used: Urinary Symptoms-A-AH Pt gets frequent UTIs.   Dr. Joshua just has me come into the lab and do a specimen and then calls in an antibiotic for me when I went to schedule her an appt. High priority message sent.   Pt agreeable to someone calling her back.          FYI Only or Action Required?: Action required by provider: clinical question for provider.  Patient was last seen in primary care on 05/03/2024 by Joshua Debby CROME, MD.  Called Nurse Triage reporting Dysuria.   Symptoms began several days ago. No fever or flank pain, no blood in urine.   Just burning and frequency.    Interventions attempted: Rest, hydration, or home remedies.  I'm out of the medicine he usually prescribes for me for the UTIs.    Symptoms are: gradually  worsening.  Triage Disposition: Call PCP Now  Patient/caregiver understands and will follow disposition?: Yes

## 2024-06-28 NOTE — Progress Notes (Signed)
 Assessment & Plan:  1. Acute cystitis with hematuria (Primary) Education provided on UTIs. Encouraged adequate hydration.  - POCT urinalysis dipstick - Urine Culture; Future - cefdinir  (OMNICEF ) 300 MG capsule; Take 1 capsule (300 mg total) by mouth 2 (two) times daily for 7 days.  Dispense: 14 capsule; Refill: 0  Results for orders placed or performed in visit on 06/28/24  POCT urinalysis dipstick  Result Value Ref Range   Color, UA yellow    Clarity, UA clear    Glucose, UA Negative Negative   Bilirubin, UA neg    Ketones, UA neg    Spec Grav, UA 1.010 1.010 - 1.025   Blood, UA pos    pH, UA 6.0 5.0 - 8.0   Protein, UA Positive (A) Negative   Urobilinogen, UA negative (A) 0.2 or 1.0 E.U./dL   Nitrite, UA pos    Leukocytes, UA Moderate (2+) (A) Negative   Appearance     Odor       Follow up plan: Return for as scheduled with PCP.  Niki Rung, MSN, APRN, FNP-C  Subjective:  HPI: Claudia Lara is a 89 y.o. female presenting on 06/28/2024 for Urinary Tract Infection (Started 2-3 days ago - urgency, frequency, burning )  Discussed the use of AI scribe software for clinical note transcription with the patient, who gave verbal consent to proceed.  History of Present Illness   Claudia Lara is a 89 year old female who presents with urinary urgency, frequency, and burning.  She has been experiencing urinary urgency, frequency, and burning for a few days, possibly longer, which has significantly disrupted her sleep due to frequent nocturnal awakenings.  She does not drink a lot of fluids unless she consciously thinks about it, which may affect her hydration status. She stays active by working out in the yard, which she believes helps maintain her health.        ROS: Negative unless specifically indicated above in HPI.   Relevant past medical history reviewed and updated as indicated.   Allergies and medications reviewed and updated.   Current Outpatient  Medications:    Calcium  Carb-Cholecalciferol  (CALCIUM  CARBONATE-VITAMIN D3 PO), Take 1 tablet by mouth daily. , Disp: , Rfl:    carvedilol  (COREG ) 12.5 MG tablet, TAKE 1 TABLET BY MOUTH TWICE A DAY WITH FOOD, Disp: 180 tablet, Rfl: 1   cholecalciferol  (VITAMIN D ) 1000 units tablet, Take 1,000 Units by mouth daily., Disp: , Rfl:    ferrous sulfate  325 (65 FE) MG tablet, Take 325 mg by mouth daily with breakfast., Disp: , Rfl:    Multiple Vitamin (MULTIVITAMIN WITH MINERALS) TABS tablet, Take 1 tablet by mouth daily., Disp: , Rfl:    torsemide  (DEMADEX ) 20 MG tablet, TAKE 2 TABLET BY MOUTH once EVERY DAY, Disp: 180 tablet, Rfl: 1   vitamin C  (ASCORBIC ACID ) 500 MG tablet, Take 500 mg by mouth daily., Disp: , Rfl:   Allergies  Allergen Reactions   Sulfonamide Derivatives Rash    Childhood reaction    Objective:   BP (!) 126/50   Pulse 78   Temp 98.7 F (37.1 C)   Resp 18   Ht 5' 3 (1.6 m)   Wt 163 lb (73.9 kg)   SpO2 95%   BMI 28.87 kg/m    Physical Exam Vitals reviewed.  Constitutional:      General: She is not in acute distress.    Appearance: Normal appearance. She is not ill-appearing, toxic-appearing or diaphoretic.  HENT:     Head: Normocephalic and atraumatic.  Eyes:     General: No scleral icterus.       Right eye: No discharge.        Left eye: No discharge.     Conjunctiva/sclera: Conjunctivae normal.  Cardiovascular:     Rate and Rhythm: Normal rate.  Pulmonary:     Effort: Pulmonary effort is normal. No respiratory distress.  Abdominal:     Tenderness: There is no right CVA tenderness or left CVA tenderness.  Musculoskeletal:        General: Normal range of motion.     Cervical back: Normal range of motion.  Skin:    General: Skin is warm and dry.     Capillary Refill: Capillary refill takes less than 2 seconds.  Neurological:     General: No focal deficit present.     Mental Status: She is alert and oriented to person, place, and time. Mental status is  at baseline.  Psychiatric:        Mood and Affect: Mood normal.        Behavior: Behavior normal.        Thought Content: Thought content normal.        Judgment: Judgment normal.

## 2024-06-30 LAB — URINE CULTURE

## 2024-07-06 DIAGNOSIS — L820 Inflamed seborrheic keratosis: Secondary | ICD-10-CM | POA: Diagnosis not present

## 2024-08-12 ENCOUNTER — Encounter: Payer: Self-pay | Admitting: Internal Medicine

## 2024-08-12 ENCOUNTER — Ambulatory Visit: Admitting: Internal Medicine

## 2024-08-12 VITALS — BP 140/60 | HR 77 | Temp 98.5°F | Ht 63.0 in | Wt 162.8 lb

## 2024-08-12 DIAGNOSIS — M10341 Gout due to renal impairment, right hand: Secondary | ICD-10-CM | POA: Diagnosis not present

## 2024-08-12 DIAGNOSIS — E60 Dietary zinc deficiency: Secondary | ICD-10-CM

## 2024-08-12 DIAGNOSIS — D500 Iron deficiency anemia secondary to blood loss (chronic): Secondary | ICD-10-CM | POA: Diagnosis not present

## 2024-08-12 DIAGNOSIS — I10 Essential (primary) hypertension: Secondary | ICD-10-CM | POA: Diagnosis not present

## 2024-08-12 DIAGNOSIS — Z23 Encounter for immunization: Secondary | ICD-10-CM | POA: Diagnosis not present

## 2024-08-12 DIAGNOSIS — N184 Chronic kidney disease, stage 4 (severe): Secondary | ICD-10-CM | POA: Diagnosis not present

## 2024-08-12 DIAGNOSIS — D599 Acquired hemolytic anemia, unspecified: Secondary | ICD-10-CM

## 2024-08-12 DIAGNOSIS — J849 Interstitial pulmonary disease, unspecified: Secondary | ICD-10-CM | POA: Diagnosis not present

## 2024-08-12 LAB — URIC ACID: Uric Acid, Serum: 9.5 mg/dL — ABNORMAL HIGH (ref 2.4–7.0)

## 2024-08-12 LAB — BASIC METABOLIC PANEL WITH GFR
BUN: 27 mg/dL — ABNORMAL HIGH (ref 6–23)
CO2: 27 meq/L (ref 19–32)
Calcium: 9.9 mg/dL (ref 8.4–10.5)
Chloride: 106 meq/L (ref 96–112)
Creatinine, Ser: 1.25 mg/dL — ABNORMAL HIGH (ref 0.40–1.20)
GFR: 37.98 mL/min — ABNORMAL LOW (ref >60.00)
Glucose, Bld: 99 mg/dL (ref 70–99)
Potassium: 4 meq/L (ref 3.5–5.1)
Sodium: 141 meq/L (ref 135–145)

## 2024-08-12 LAB — CBC WITH DIFFERENTIAL/PLATELET
Basophils Absolute: 0.1 K/uL (ref 0.0–0.1)
Basophils Relative: 0.7 % (ref 0.0–3.0)
Eosinophils Absolute: 0.4 K/uL (ref 0.0–0.7)
Eosinophils Relative: 4 % (ref 0.0–5.0)
HCT: 27.3 % — ABNORMAL LOW (ref 36.0–46.0)
Hemoglobin: 9.5 g/dL — ABNORMAL LOW (ref 12.0–15.0)
Lymphocytes Relative: 13.6 % (ref 12.0–46.0)
Lymphs Abs: 1.3 K/uL (ref 0.7–4.0)
MCHC: 34.6 g/dL (ref 30.0–36.0)
MCV: 86.4 fl (ref 78.0–100.0)
Monocytes Absolute: 0.9 K/uL (ref 0.1–1.0)
Monocytes Relative: 9.1 % (ref 3.0–12.0)
Neutro Abs: 6.8 K/uL (ref 1.4–7.7)
Neutrophils Relative %: 72.6 % (ref 43.0–77.0)
Platelets: 263 K/uL (ref 150.0–400.0)
RBC: 3.16 Mil/uL — ABNORMAL LOW (ref 3.87–5.11)
RDW: 21.4 % — ABNORMAL HIGH (ref 11.5–15.5)
WBC: 9.4 K/uL (ref 4.0–10.5)

## 2024-08-12 LAB — IBC + FERRITIN
Ferritin: 1025.2 ng/mL — ABNORMAL HIGH (ref 10.0–291.0)
Iron: 36 ug/dL — ABNORMAL LOW (ref 42–145)
Saturation Ratios: 17.3 % — ABNORMAL LOW (ref 20.0–50.0)
TIBC: 208.6 ug/dL — ABNORMAL LOW (ref 250.0–450.0)
Transferrin: 149 mg/dL — ABNORMAL LOW (ref 212.0–360.0)

## 2024-08-12 MED ORDER — ALLOPURINOL 100 MG PO TABS: 50.0000 mg | ORAL_TABLET | Freq: Every day | ORAL | 0 refills | Status: DC

## 2024-08-12 MED ORDER — METHYLPREDNISOLONE 4 MG PO TBPK: ORAL_TABLET | ORAL | 0 refills | Status: AC

## 2024-08-12 MED ORDER — COLCHICINE 0.6 MG PO TABS: 0.6000 mg | ORAL_TABLET | Freq: Every day | ORAL | 0 refills | Status: AC

## 2024-08-12 MED ORDER — ACCRUFER 30 MG PO CAPS
1.0000 | ORAL_CAPSULE | Freq: Two times a day (BID) | ORAL | 0 refills | Status: AC
Start: 2024-08-12 — End: ?

## 2024-08-12 NOTE — Progress Notes (Signed)
 Subjective:  Patient ID: Claudia Lara, female    DOB: 05/10/1932  Age: 89 y.o. MRN: [id number]  CC: Medical Management of Chronic Issues (3 month follow up. Right hand arthritis getting worst and her left foot also.  ), Anemia, and Hypertension   HPI Claudia Lara presents for f/up ----  Discussed the use of AI scribe software for clinical note transcription with the patient, who gave verbal consent to proceed.  History of Present Illness Claudia Lara is a 89 year old female with gout who presents with right index finger pain and swelling.  She has been experiencing pain and swelling in her right index finger for the past couple of days. The finger feels warm, and it is her writing finger, which has made signing her name difficult. She has a history of gout, previously affecting her second toe, and notes that the current symptoms in her finger feel similar to her previous experience with gout.  She experiences daily swelling in her legs and feet. The swelling is persistent and noticeable. No associated chest pain, shortness of breath, dizziness, or lightheadedness.  She mentions suffering from constipation. She reports sun exposure both at the beach and in her yard.  She does not feel any worsening of her anemia and describes her stomach as feeling 'about like always'.     Outpatient Medications Prior to Visit  Medication Sig Dispense Refill   Calcium  Carb-Cholecalciferol  (CALCIUM  CARBONATE-VITAMIN D3 PO) Take 1 tablet by mouth daily.      carvedilol  (COREG ) 12.5 MG tablet TAKE 1 TABLET BY MOUTH TWICE A DAY WITH FOOD 180 tablet 1   cholecalciferol  (VITAMIN D ) 1000 units tablet Take 1,000 Units by mouth daily.     Multiple Vitamin (MULTIVITAMIN WITH MINERALS) TABS tablet Take 1 tablet by mouth daily.     torsemide  (DEMADEX ) 20 MG tablet TAKE 2 TABLET BY MOUTH once EVERY DAY 180 tablet 1   vitamin C  (ASCORBIC ACID ) 500 MG tablet Take 500 mg by mouth daily.     ferrous sulfate   325 (65 FE) MG tablet Take 325 mg by mouth daily with breakfast.     No facility-administered medications prior to visit.    ROS Review of Systems  Objective:  BP (!) 140/60 (BP Location: Left Arm, Patient Position: Sitting, Cuff Size: Small)   Pulse 77   Temp 98.5 F (36.9 C) (Oral)   Ht 5' 3 (1.6 m)   Wt 162 lb 12.8 oz (73.8 kg)   SpO2 95%   BMI 28.84 kg/m   BP Readings from Last 3 Encounters:  08/12/24 (!) 140/60  06/28/24 (!) 126/50  05/03/24 (!) 160/68    Wt Readings from Last 3 Encounters:  08/12/24 162 lb 12.8 oz (73.8 kg)  06/28/24 163 lb (73.9 kg)  05/03/24 167 lb (75.8 kg)    Physical Exam Vitals reviewed.  Constitutional:      Appearance: Normal appearance.  Eyes:     General: No scleral icterus. Cardiovascular:     Rate and Rhythm: Normal rate and regular rhythm.     Heart sounds: Murmur heard.     Systolic murmur is present with a grade of 2/6.     No friction rub. No gallop.  Pulmonary:     Effort: Pulmonary effort is normal.     Breath sounds: No stridor. No wheezing, rhonchi or rales.  Abdominal:     General: Abdomen is flat.     Palpations: There is no mass.  Tenderness: There is no abdominal tenderness. There is no guarding.     Hernia: No hernia is present.  Musculoskeletal:     Cervical back: Neck supple.     Right lower leg: 1+ Pitting Edema present.     Left lower leg: 1+ Pitting Edema present.  Lymphadenopathy:     Cervical: No cervical adenopathy.  Skin:    Coloration: Skin is pale.  Neurological:     General: No focal deficit present.     Mental Status: She is alert.  Psychiatric:        Mood and Affect: Mood normal.        Behavior: Behavior normal.     Lab Results  Component Value Date   WBC 9.4 08/12/2024   HGB 9.5 (L) 08/12/2024   HCT 27.3 (L) 08/12/2024   PLT 263.0 08/12/2024   GLUCOSE 99 08/12/2024   CHOL 132 11/12/2022   TRIG 120.0 11/12/2022   HDL 53.90 11/12/2022   LDLDIRECT 56.0 07/08/2018   LDLCALC  54 11/12/2022   ALT 15 04/15/2024   AST 21 04/15/2024   NA 141 08/12/2024   K 4.0 08/12/2024   CL 106 08/12/2024   CREATININE 1.25 (H) 08/12/2024   BUN 27 (H) 08/12/2024   CO2 27 08/12/2024   TSH 1.19 05/03/2024   INR 1.1 (H) 05/17/2020   HGBA1C 4.4 (L) 01/29/2024    ECHOCARDIOGRAM COMPLETE Result Date: 05/13/2024    ECHOCARDIOGRAM REPORT   Patient Name:   Claudia Lara Date of Exam: 05/13/2024 Medical Rec #:  [id number]      Height:       63.0 in Accession #:    [id number]     Weight:       167.0 lb Date of Birth:  Jan 26, 1932      BSA:          1.791 m Patient Age:    89 years       BP:           130/59 mmHg Patient Gender: F              HR:           75 bpm. Exam Location:  [address] Procedure: 2D Echo, 3D Echo, Cardiac Doppler, Color Doppler and Strain Analysis            (Both Spectral and Color Flow Doppler were utilized during            procedure). Indications:    I50.31 CHF  History:        Patient has prior history of Echocardiogram examinations, most                 recent 12/19/2022. Carotid Disease and Venous insufficiency, CKD,                 Aortic Valve Disease; Risk Factors:Hypertension and HLD.  Sonographer:    Waldo Guadalajara RCS Referring Phys: [phone] DEBBY LITTIE MOLT IMPRESSIONS  1. Left ventricular ejection fraction, by estimation, is 60 to 65%. Left ventricular ejection fraction by 3D volume is 62 %. The left ventricle has normal function. The left ventricle has no regional wall motion abnormalities. Left ventricular diastolic  function could not be evaluated. Elevated left atrial pressure. The average left ventricular global longitudinal strain is -17.8 %. The global longitudinal strain is abnormal.  2. Right ventricular systolic function is normal. The right ventricular size is normal. There is normal pulmonary artery systolic pressure. The estimated  right ventricular systolic pressure is 33.5 mmHg.  3. Left atrial size was mildly dilated.  4. A small pericardial effusion is  present. There is no evidence of cardiac tamponade.  5. The mitral valve is normal in structure. Moderate mitral valve regurgitation. No evidence of mitral stenosis. Moderate mitral annular calcification.  6. The aortic valve is tricuspid. There is mild calcification of the aortic valve. Aortic valve regurgitation is not visualized. Mild aortic valve stenosis. Aortic valve mean gradient measures 16.0 mmHg. Aortic valve Vmax measures 2.56 m/s.  7. The inferior vena cava is normal in size with greater than 50% respiratory variability, suggesting right atrial pressure of 3 mmHg. FINDINGS  Left Ventricle: Left ventricular ejection fraction, by estimation, is 60 to 65%. Left ventricular ejection fraction by 3D volume is 62 %. The left ventricle has normal function. The left ventricle has no regional wall motion abnormalities. The average left ventricular global longitudinal strain is -17.8 %. Strain was performed and the global longitudinal strain is abnormal. The left ventricular internal cavity size was normal in size. There is no left ventricular hypertrophy. Left ventricular diastolic function could not be evaluated due to mitral annular calcification (moderate or greater). Left ventricular diastolic function could not be evaluated. Elevated left atrial pressure. Right Ventricle: The right ventricular size is normal. No increase in right ventricular wall thickness. Right ventricular systolic function is normal. There is normal pulmonary artery systolic pressure. The tricuspid regurgitant velocity is 2.76 m/s, and  with an assumed right atrial pressure of 3 mmHg, the estimated right ventricular systolic pressure is 33.5 mmHg. Left Atrium: Left atrial size was mildly dilated. Right Atrium: Right atrial size was normal in size. Pericardium: A small pericardial effusion is present. There is no evidence of cardiac tamponade. Mitral Valve: The mitral valve is normal in structure. Moderate mitral annular calcification.  Moderate mitral valve regurgitation. No evidence of mitral valve stenosis. Tricuspid Valve: The tricuspid valve is normal in structure. Tricuspid valve regurgitation is mild . No evidence of tricuspid stenosis. Aortic Valve: The aortic valve is tricuspid. There is mild calcification of the aortic valve. Aortic valve regurgitation is not visualized. Mild aortic stenosis is present. Aortic valve mean gradient measures 16.0 mmHg. Aortic valve peak gradient measures 26.2 mmHg. Aortic valve area, by VTI measures 1.14 cm. Pulmonic Valve: The pulmonic valve was normal in structure. Pulmonic valve regurgitation is not visualized. No evidence of pulmonic stenosis. Aorta: The aortic root is normal in size and structure. Venous: The inferior vena cava is normal in size with greater than 50% respiratory variability, suggesting right atrial pressure of 3 mmHg. IAS/Shunts: No atrial level shunt detected by color flow Doppler. Additional Comments: 3D was performed not requiring image post processing on an independent workstation and was normal.  LEFT VENTRICLE PLAX 2D LVIDd:         4.40 cm         Diastology LVIDs:         3.10 cm         LV e' medial:    6.53 cm/s LV PW:         1.20 cm         LV E/e' medial:  24.5 LV IVS:        1.10 cm         LV e' lateral:   9.14 cm/s LVOT diam:     1.70 cm         LV E/e' lateral: 17.5 LV SV:  76 LV SV Index:   43              2D Longitudinal LVOT Area:     2.27 cm        Strain                                2D Strain GLS   -17.0 %                                (A4C):                                2D Strain GLS   -20.2 %                                (A3C):                                2D Strain GLS   -16.2 %                                (A2C):                                2D Strain GLS   -17.8 %                                Avg:                                 3D Volume EF                                LV 3D EF:    Left                                              ventricul                                             ar                                             ejection                                             fraction  by 3D                                             volume is                                             62 %.                                 3D Volume EF:                                3D EF:        62 %                                LV EDV:       123 ml                                LV ESV:       47 ml                                LV SV:        76 ml RIGHT VENTRICLE RV Basal diam:  2.90 cm RV S prime:     14.40 cm/s RVSP:           33.5 mmHg LEFT ATRIUM             Index        RIGHT ATRIUM           Index LA diam:        4.50 cm 2.51 cm/m   RA Pressure: 3.00 mmHg LA Vol (A2C):   50.5 ml 28.20 ml/m  RA Area:     12.60 cm LA Vol (A4C):   53.6 ml 29.93 ml/m  RA Volume:   29.70 ml  16.58 ml/m LA Biplane Vol: 52.3 ml 29.20 ml/m  AORTIC VALVE AV Area (Vmax):    1.13 cm AV Area (Vmean):   1.08 cm AV Area (VTI):     1.14 cm AV Vmax:           256.00 cm/s AV Vmean:          189.000 cm/s AV VTI:            0.669 m AV Peak Grad:      26.2 mmHg AV Mean Grad:      16.0 mmHg LVOT Vmax:         128.00 cm/s LVOT Vmean:        90.200 cm/s LVOT VTI:          0.336 m LVOT/AV VTI ratio: 0.50  AORTA Ao Root diam: 2.70 cm Ao Asc diam:  2.90 cm MITRAL VALVE                TRICUSPID VALVE MV Area (PHT):  TR Peak grad:   30.5 mmHg MV Decel Time:              TR Vmax:        276.00 cm/s MR Peak grad: 110.2 mmHg    Estimated RAP:  3.00 mmHg MR Mean grad: 79.0 mmHg     RVSP:           33.5 mmHg MR Vmax:      525.00 cm/s MR Vmean:     426.0 cm/s    SHUNTS MV E velocity: 160.00 cm/s  Systemic VTI:  0.34 m MV A velocity: 165.00 cm/s  Systemic Diam: 1.70 cm MV E/A ratio:  0.97 Morene Brownie Electronically signed by Morene Brownie Signature Date/Time: 05/13/2024/11:09:31 AM    Final     Assessment & Plan:  Need  for immunization against influenza -     Flu vaccine HIGH DOSE PF(Fluzone Trivalent)  Acute gout due to renal impairment involving right hand -     methylPREDNISolone ; TAKE AS DIRECTED  Dispense: 21 tablet; Refill: 0 -     Colchicine ; Take 1 tablet (0.6 mg total) by mouth daily.  Dispense: 90 tablet; Refill: 0 -     Basic metabolic panel with GFR; Future -     Uric acid; Future -     Allopurinol ; Take 0.5 tablets (50 mg total) by mouth daily.  Dispense: 45 tablet; Refill: 0  Essential hypertension -     Basic metabolic panel with GFR; Future -     CBC with Differential/Platelet; Future  Zinc  deficiency  Iron deficiency anemia due to chronic blood loss -     IBC + Ferritin; Future -     ACCRUFeR ; Take 1 capsule (30 mg total) by mouth in the morning and at bedtime.  Dispense: 180 capsule; Refill: 0  Interstitial pulmonary disease (HCC)  Kidney disease, chronic, stage IV (GFR 15-29 ml/min) (HCC) -     Basic metabolic panel with GFR; Future -     CBC with Differential/Platelet; Future -     Uric acid; Future -     Allopurinol ; Take 0.5 tablets (50 mg total) by mouth daily.  Dispense: 45 tablet; Refill: 0  Acquired hemolytic anemia (HCC) -     CBC with Differential/Platelet; Future -     Lactate dehydrogenase; Future     Follow-up: Return in about 4 months (around 12/12/2024).  Debby Molt, MD

## 2024-08-12 NOTE — Patient Instructions (Signed)
Gout  Gout is a condition that causes painful swelling of the joints. Gout is a type of inflammation of the joints (arthritis). This condition is caused by having too much uric acid in the body. Uric acid is a chemical that forms when the body breaks down substances called purines. Purines are important for building body proteins. When the body has too much uric acid, sharp crystals can form and build up inside the joints. This causes pain and swelling. Gout attacks can happen quickly and may be very painful (acute gout). Over time, the attacks can affect more joints and become more frequent (chronic gout). Gout can also cause uric acid to build up under the skin and inside the kidneys. What are the causes? This condition is caused by too much uric acid in your blood. This can happen because: Your kidneys do not remove enough uric acid from your blood. This is the most common cause. Your body makes too much uric acid. This can happen with some cancers and cancer treatments. It can also occur if your body is breaking down too many red blood cells (hemolytic anemia). You eat too many foods that are high in purines. These foods include organ meats and some seafood. Alcohol, especially beer, is also high in purines. A gout attack may be triggered by trauma or stress. What increases the risk? The following factors may make you more likely to develop this condition: Having a family history of gout. Being female and middle-aged. Being female and having gone through menopause. Taking certain medicines, including aspirin, cyclosporine, diuretics, levodopa, and niacin. Having an organ transplant. Having certain conditions, such as: Being obese. Lead poisoning. Kidney disease. A skin condition called psoriasis. Other factors include: Losing weight too quickly. Being dehydrated. Frequently drinking alcohol, especially beer. Frequently drinking beverages that are sweetened with a type of sugar called  fructose. What are the signs or symptoms? An attack of acute gout happens quickly. It usually occurs in just one joint. The most common place is the big toe. Attacks often start at night. Other joints that may be affected include joints of the feet, ankle, knee, fingers, wrist, or elbow. Symptoms of this condition may include: Severe pain. Warmth. Swelling. Stiffness. Tenderness. The affected joint may be very painful to touch. Shiny, red, or purple skin. Chills and fever. Chronic gout may cause symptoms more frequently. More joints may be involved. You may also have white or yellow lumps (tophi) on your hands or feet or in other areas near your joints. How is this diagnosed? This condition is diagnosed based on your symptoms, your medical history, and a physical exam. You may have tests, such as: Blood tests to measure uric acid levels. Removal of joint fluid with a thin needle (aspiration) to look for uric acid crystals. X-rays to look for joint damage. How is this treated? Treatment for this condition has two phases: treating an acute attack and preventing future attacks. Acute gout treatment may include medicines to reduce pain and swelling, including: NSAIDs, such as ibuprofen. Steroids. These are strong anti-inflammatory medicines that can be taken by mouth (orally) or injected into a joint. Colchicine. This medicine relieves pain and swelling when it is taken soon after an attack. It can be given by mouth or through an IV. Preventive treatment may include: Daily use of smaller doses of NSAIDs or colchicine. Use of a medicine that reduces uric acid levels in your blood, such as allopurinol. Changes to your diet. You may need to see   a dietitian about what to eat and drink to prevent gout. Follow these instructions at home: During a gout attack  If directed, put ice on the affected area. To do this: Put ice in a plastic bag. Place a towel between your skin and the bag. Leave the  ice on for 20 minutes, 2-3 times a day. Remove the ice if your skin turns bright red. This is very important. If you cannot feel pain, heat, or cold, you have a greater risk of damage to the area. Raise (elevate) the affected joint above the level of your heart as often as possible. Rest the joint as much as possible. If the affected joint is in your leg, you may be given crutches to use. Follow instructions from your health care provider about eating or drinking restrictions. Avoiding future gout attacks Follow a low-purine diet as told by your dietitian or health care provider. Avoid foods and drinks that are high in purines, including liver, kidney, anchovies, asparagus, herring, mushrooms, mussels, and beer. Maintain a healthy weight or lose weight if you are overweight. If you want to lose weight, talk with your health care provider. Do not lose weight too quickly. Start or maintain an exercise program as told by your health care provider. Eating and drinking Avoid drinking beverages that contain fructose. Drink enough fluids to keep your urine pale yellow. If you drink alcohol: Limit how much you have to: 0-1 drink a day for women who are not pregnant. 0-2 drinks a day for men. Know how much alcohol is in a drink. In the [place] one drink equals one 12 oz bottle of beer (355 mL), one 5 oz glass of wine (148 mL), or one 1 oz glass of hard liquor (44 mL). General instructions Take over-the-counter and prescription medicines only as told by your health care provider. Ask your health care provider if the medicine prescribed to you requires you to avoid driving or using machinery. Return to your normal activities as told by your health care provider. Ask your health care provider what activities are safe for you. Keep all follow-up visits. This is important. Where to find more information National Institutes of Health: www.niams.nih.gov Contact a health care provider if you have: Another  gout attack. Continuing symptoms of a gout attack after 10 days of treatment. Side effects from your medicines. Chills or a fever. Burning pain when you urinate. Pain in your lower back or abdomen. Get help right away if you: Have severe or uncontrolled pain. Cannot urinate. Summary Gout is painful swelling of the joints caused by having too much uric acid in the body. The most common site for gout to occur is in the big toe, but it can affect other joints in the body. Medicines and dietary changes can help to prevent and treat gout attacks. This information is not intended to replace advice given to you by your health care provider. Make sure you discuss any questions you have with your health care provider. Document Revised: 08/15/2021 Document Reviewed: 08/15/2021 Elsevier Patient Education  2024 [employer]  

## 2024-08-13 ENCOUNTER — Ambulatory Visit: Payer: Self-pay | Admitting: Internal Medicine

## 2024-08-13 LAB — LACTATE DEHYDROGENASE: LDH: 270 U/L — ABNORMAL HIGH (ref 120–250)

## 2024-08-24 DIAGNOSIS — B078 Other viral warts: Secondary | ICD-10-CM | POA: Diagnosis not present

## 2024-08-24 DIAGNOSIS — L820 Inflamed seborrheic keratosis: Secondary | ICD-10-CM | POA: Diagnosis not present

## 2024-08-27 ENCOUNTER — Other Ambulatory Visit: Payer: Self-pay | Admitting: Internal Medicine

## 2024-08-27 DIAGNOSIS — I10 Essential (primary) hypertension: Secondary | ICD-10-CM

## 2024-08-27 DIAGNOSIS — I5189 Other ill-defined heart diseases: Secondary | ICD-10-CM

## 2024-09-27 ENCOUNTER — Encounter: Payer: Self-pay | Admitting: Radiology

## 2024-11-05 ENCOUNTER — Other Ambulatory Visit: Payer: Self-pay | Admitting: Internal Medicine

## 2024-11-05 DIAGNOSIS — M10341 Gout due to renal impairment, right hand: Secondary | ICD-10-CM

## 2024-11-05 DIAGNOSIS — N184 Chronic kidney disease, stage 4 (severe): Secondary | ICD-10-CM

## 2024-12-14 ENCOUNTER — Ambulatory Visit: Admitting: Internal Medicine

## 2024-12-14 ENCOUNTER — Encounter: Payer: Self-pay | Admitting: Internal Medicine

## 2024-12-14 VITALS — BP 162/72 | HR 74 | Temp 97.6°F | Resp 16 | Ht 63.0 in | Wt 158.8 lb

## 2024-12-14 DIAGNOSIS — I10 Essential (primary) hypertension: Secondary | ICD-10-CM

## 2024-12-14 DIAGNOSIS — I447 Left bundle-branch block, unspecified: Secondary | ICD-10-CM | POA: Diagnosis not present

## 2024-12-14 DIAGNOSIS — N1832 Chronic kidney disease, stage 3b: Secondary | ICD-10-CM | POA: Diagnosis not present

## 2024-12-14 DIAGNOSIS — I350 Nonrheumatic aortic (valve) stenosis: Secondary | ICD-10-CM

## 2024-12-14 DIAGNOSIS — D599 Acquired hemolytic anemia, unspecified: Secondary | ICD-10-CM

## 2024-12-14 DIAGNOSIS — D500 Iron deficiency anemia secondary to blood loss (chronic): Secondary | ICD-10-CM | POA: Diagnosis not present

## 2024-12-14 DIAGNOSIS — J849 Interstitial pulmonary disease, unspecified: Secondary | ICD-10-CM

## 2024-12-14 DIAGNOSIS — N184 Chronic kidney disease, stage 4 (severe): Secondary | ICD-10-CM

## 2024-12-14 DIAGNOSIS — E60 Dietary zinc deficiency: Secondary | ICD-10-CM

## 2024-12-14 LAB — BASIC METABOLIC PANEL WITH GFR
BUN: 20 mg/dL (ref 6–23)
CO2: 26 meq/L (ref 19–32)
Calcium: 9.3 mg/dL (ref 8.4–10.5)
Chloride: 107 meq/L (ref 96–112)
Creatinine, Ser: 1.04 mg/dL (ref 0.40–1.20)
GFR: 47.24 mL/min — ABNORMAL LOW
Glucose, Bld: 102 mg/dL — ABNORMAL HIGH (ref 70–99)
Potassium: 4.2 meq/L (ref 3.5–5.1)
Sodium: 139 meq/L (ref 135–145)

## 2024-12-14 LAB — HEPATIC FUNCTION PANEL
ALT: 11 U/L (ref 3–35)
AST: 17 U/L (ref 5–37)
Albumin: 4.6 g/dL (ref 3.5–5.2)
Alkaline Phosphatase: 73 U/L (ref 39–117)
Bilirubin, Direct: 0.5 mg/dL — ABNORMAL HIGH (ref 0.1–0.3)
Total Bilirubin: 2.4 mg/dL — ABNORMAL HIGH (ref 0.2–1.2)
Total Protein: 6.6 g/dL (ref 6.0–8.3)

## 2024-12-14 LAB — IBC + FERRITIN
Ferritin: 841.6 ng/mL — ABNORMAL HIGH (ref 10.0–291.0)
Iron: 68 ug/dL (ref 42–145)
Saturation Ratios: 31.1 % (ref 20.0–50.0)
TIBC: 218.4 ug/dL — ABNORMAL LOW (ref 250.0–450.0)
Transferrin: 156 mg/dL — ABNORMAL LOW (ref 212.0–360.0)

## 2024-12-14 LAB — CBC WITH DIFFERENTIAL/PLATELET
Basophils Absolute: 0 K/uL (ref 0.0–0.1)
Basophils Relative: 0.5 % (ref 0.0–3.0)
Eosinophils Absolute: 0.2 K/uL (ref 0.0–0.7)
Eosinophils Relative: 2.4 % (ref 0.0–5.0)
HCT: 27.9 % — ABNORMAL LOW (ref 36.0–46.0)
Hemoglobin: 9.9 g/dL — ABNORMAL LOW (ref 12.0–15.0)
Lymphocytes Relative: 15.2 % (ref 12.0–46.0)
Lymphs Abs: 1.3 K/uL (ref 0.7–4.0)
MCHC: 35.5 g/dL (ref 30.0–36.0)
MCV: 86.4 fl (ref 78.0–100.0)
Monocytes Absolute: 0.7 K/uL (ref 0.1–1.0)
Monocytes Relative: 8.9 % (ref 3.0–12.0)
Neutro Abs: 6 K/uL (ref 1.4–7.7)
Neutrophils Relative %: 73 % (ref 43.0–77.0)
Platelets: 240 K/uL (ref 150.0–400.0)
RBC: 3.23 Mil/uL — ABNORMAL LOW (ref 3.87–5.11)
RDW: 21 % — ABNORMAL HIGH (ref 11.5–15.5)
WBC: 8.3 K/uL (ref 4.0–10.5)

## 2024-12-14 LAB — URIC ACID: Uric Acid, Serum: 6.4 mg/dL (ref 2.4–7.0)

## 2024-12-14 LAB — TSH: TSH: 1.46 u[IU]/mL (ref 0.35–5.50)

## 2024-12-14 NOTE — Patient Instructions (Signed)
Left Bundle Branch Block  Left bundle branch block (LBBB) is a problem with the way that electrical impulses pass through the heart (electrical conduction abnormality). The heart needs an electrical signal to beat like it should. The electrical signal for a heartbeat starts in the upper chambers of the heart (atria) and then travels to the two lower chambers (left and rightventricles). An LBBB is a block of the pathway that carries the signal to the left ventricle. If you have LBBB, the left side of your heart does not beat like normal. LBBB may be a warning sign of heart disease. What are the causes? LBBB may be caused by: Heart disease. Arteriosclerosis. This is a disease of the arteries in the heart. Cardiomyopathy. This is when the heart muscle stiffens or becomes weak. Myocarditis. This is an infection of the heart muscle. High blood pressure. In some cases, the cause may not be known. What increases the risk? You are more likely to get LBBB if: You are female. You are 64 years of age or older. You have heart disease. You have had a heart attack or heart surgery. What are the signs or symptoms? LBBB may not cause any symptoms. If you do have symptoms, they may include: Feeling dizzy or light-headed. Fainting. How is this diagnosed? LBBB may be diagnosed based on an electrocardiogram (ECG). It is often found when an ECG is done as part of a physical exam or to help find the cause of fainting spells. You may also have imaging tests done. These may include: Chest X-rays. Echocardiogram. How is this treated? You may not need treatment if you do not have symptoms or any other heart problems. But you may need to see your health care provider more often to make sure you do not get other heart problems. You may also need treatment for high blood pressure. If LBBB causes symptoms or other heart problems, you may need to have an electrical device (pacemaker) placed under the skin of your  chest. A pacemaker sends signals to your heart to keep it beating like it should. Follow these instructions at home: Lifestyle  Ask your provider about what you may eat and drink. Eat foods that are good for your heart and stay at a healthy weight. Work with a diet and Hospital doctor (dietitian) to make the best eating plan for you. Activity Get regular exercise as told by your provider. Return to your normal activities as told by your provider. Ask your provider what activities are safe for you. General instructions Take over-the-counter and prescription medicines only as told by your provider. Do not use any products that contain nicotine or tobacco. These products include cigarettes, chewing tobacco, and vaping devices, such as e-cigarettes. If you need help quitting, ask your provider. Contact a health care provider if: You feel light-headed. You faint. Get help right away if: You have chest pain. You have trouble breathing. These symptoms may be an emergency. Get help right away. Call 911. Do not wait to see if the symptoms will go away. Do not drive yourself to the hospital. This information is not intended to replace advice given to you by your health care provider. Make sure you discuss any questions you have with your health care provider. Document Revised: 08/13/2022 Document Reviewed: 08/13/2022 Elsevier Patient Education  2024 [employer].

## 2024-12-14 NOTE — Progress Notes (Unsigned)
 "  Subjective:  Patient ID: JAMIRRA CURNOW, female    DOB: [birth year]  Age: 89 y.o. MRN: [id number]  CC: Hypertension   HPI ANAVEY COOMBES presents for f/up ---  Discussed the use of AI scribe software for clinical note transcription with the patient, who gave verbal consent to proceed.  History of Present Illness JENNAFER GLADUE is a 89 year old female who presents with weight loss and fatigue.  She has experienced recent weight loss, which she noticed after realizing her scales were broken. She mentions seeing wrinkles that were not previously visible, indicating a change in her appearance. No nausea, vomiting, abdominal pain, diarrhea, constipation, or trouble swallowing.  She experiences persistent fatigue and states 'I don't have energy.' No dizziness, lightheadedness, or shortness of breath. She has run out of her iron supplement a few days ago. She mentions a decrease in her physical activity, particularly in yard work, due to her current energy levels.  She has a history of taking torsemide  for fluid retention in her legs but is not currently taking it. Her legs are usually swollen at night.     Outpatient Medications Prior to Visit  Medication Sig Dispense Refill   allopurinol  (ZYLOPRIM ) 100 MG tablet TAKE 1/2 TABLET BY MOUTH DAILY 45 tablet 0   Calcium  Carb-Cholecalciferol  (CALCIUM  CARBONATE-VITAMIN D3 PO) Take 1 tablet by mouth daily.      carvedilol  (COREG ) 12.5 MG tablet TAKE 1 TABLET BY MOUTH TWICE A DAY WITH FOOD 180 tablet 1   cholecalciferol  (VITAMIN D ) 1000 units tablet Take 1,000 Units by mouth daily.     colchicine  0.6 MG tablet Take 1 tablet (0.6 mg total) by mouth daily. 90 tablet 0   Ferric Maltol  (ACCRUFER ) 30 MG CAPS Take 1 capsule (30 mg total) by mouth in the morning and at bedtime. 180 capsule 0   Multiple Vitamin (MULTIVITAMIN WITH MINERALS) TABS tablet Take 1 tablet by mouth daily.     torsemide  (DEMADEX ) 20 MG tablet TAKE 2 TABLET BY MOUTH once EVERY DAY  180 tablet 1   vitamin C  (ASCORBIC ACID ) 500 MG tablet Take 500 mg by mouth daily.     No facility-administered medications prior to visit.    ROS Review of Systems  Objective:  BP (!) 162/72 (BP Location: Left Arm, Patient Position: Sitting, Cuff Size: Normal)   Pulse 74   Temp 97.6 F (36.4 C) (Oral)   Resp 16   Ht 5' 3 (1.6 m)   Wt 158 lb 12.8 oz (72 kg)   SpO2 95%   BMI 28.13 kg/m   BP Readings from Last 3 Encounters:  12/14/24 (!) 162/72  08/12/24 (!) 140/60  06/28/24 (!) 126/50    Wt Readings from Last 3 Encounters:  12/14/24 158 lb 12.8 oz (72 kg)  08/12/24 162 lb 12.8 oz (73.8 kg)  06/28/24 163 lb (73.9 kg)    Physical Exam Cardiovascular:     Rate and Rhythm: Normal rate and regular rhythm.     Heart sounds: Murmur heard.     Systolic murmur is present with a grade of 2/6.     Comments: EKG-  NSR, 77 bpm LBBB - new +LVH No Q waves Musculoskeletal:     Right lower leg: No edema.     Left lower leg: No edema.     Lab Results  Component Value Date   WBC 8.3 12/14/2024   HGB 9.9 (L) 12/14/2024   HCT 27.9 (L) 12/14/2024   PLT 240.0  12/14/2024   GLUCOSE 102 (H) 12/14/2024   CHOL 132 11/12/2022   TRIG 120.0 11/12/2022   HDL 53.90 11/12/2022   LDLDIRECT 56.0 07/08/2018   LDLCALC 54 11/12/2022   ALT 11 12/14/2024   AST 17 12/14/2024   NA 139 12/14/2024   K 4.2 12/14/2024   CL 107 12/14/2024   CREATININE 1.04 12/14/2024   BUN 20 12/14/2024   CO2 26 12/14/2024   TSH 1.46 12/14/2024   INR 1.1 (H) 05/17/2020   HGBA1C 4.4 (L) 01/29/2024    ECHOCARDIOGRAM COMPLETE Result Date: 05/13/2024    ECHOCARDIOGRAM REPORT   Patient Name:   ARRIA NAIM Date of Exam: 05/13/2024 Medical Rec #:  [id number]      Height:       63.0 in Accession #:    [id number]     Weight:       167.0 lb Date of Birth:  1932-04-24      BSA:          1.791 m Patient Age:    89 years       BP:           130/59 mmHg Patient Gender: F              HR:           75 bpm. Exam  Location:  [address] Procedure: 2D Echo, 3D Echo, Cardiac Doppler, Color Doppler and Strain Analysis            (Both Spectral and Color Flow Doppler were utilized during            procedure). Indications:    I50.31 CHF  History:        Patient has prior history of Echocardiogram examinations, most                 recent 12/19/2022. Carotid Disease and Venous insufficiency, CKD,                 Aortic Valve Disease; Risk Factors:Hypertension and HLD.  Sonographer:    Waldo Guadalajara RCS Referring Phys: [phone] DEBBY LITTIE MOLT IMPRESSIONS  1. Left ventricular ejection fraction, by estimation, is 60 to 65%. Left ventricular ejection fraction by 3D volume is 62 %. The left ventricle has normal function. The left ventricle has no regional wall motion abnormalities. Left ventricular diastolic  function could not be evaluated. Elevated left atrial pressure. The average left ventricular global longitudinal strain is -17.8 %. The global longitudinal strain is abnormal.  2. Right ventricular systolic function is normal. The right ventricular size is normal. There is normal pulmonary artery systolic pressure. The estimated right ventricular systolic pressure is 33.5 mmHg.  3. Left atrial size was mildly dilated.  4. A small pericardial effusion is present. There is no evidence of cardiac tamponade.  5. The mitral valve is normal in structure. Moderate mitral valve regurgitation. No evidence of mitral stenosis. Moderate mitral annular calcification.  6. The aortic valve is tricuspid. There is mild calcification of the aortic valve. Aortic valve regurgitation is not visualized. Mild aortic valve stenosis. Aortic valve mean gradient measures 16.0 mmHg. Aortic valve Vmax measures 2.56 m/s.  7. The inferior vena cava is normal in size with greater than 50% respiratory variability, suggesting right atrial pressure of 3 mmHg. FINDINGS  Left Ventricle: Left ventricular ejection fraction, by estimation, is 60 to 65%. Left ventricular  ejection fraction by 3D volume is 62 %. The left ventricle has normal function. The left  ventricle has no regional wall motion abnormalities. The average left ventricular global longitudinal strain is -17.8 %. Strain was performed and the global longitudinal strain is abnormal. The left ventricular internal cavity size was normal in size. There is no left ventricular hypertrophy. Left ventricular diastolic function could not be evaluated due to mitral annular calcification (moderate or greater). Left ventricular diastolic function could not be evaluated. Elevated left atrial pressure. Right Ventricle: The right ventricular size is normal. No increase in right ventricular wall thickness. Right ventricular systolic function is normal. There is normal pulmonary artery systolic pressure. The tricuspid regurgitant velocity is 2.76 m/s, and  with an assumed right atrial pressure of 3 mmHg, the estimated right ventricular systolic pressure is 33.5 mmHg. Left Atrium: Left atrial size was mildly dilated. Right Atrium: Right atrial size was normal in size. Pericardium: A small pericardial effusion is present. There is no evidence of cardiac tamponade. Mitral Valve: The mitral valve is normal in structure. Moderate mitral annular calcification. Moderate mitral valve regurgitation. No evidence of mitral valve stenosis. Tricuspid Valve: The tricuspid valve is normal in structure. Tricuspid valve regurgitation is mild . No evidence of tricuspid stenosis. Aortic Valve: The aortic valve is tricuspid. There is mild calcification of the aortic valve. Aortic valve regurgitation is not visualized. Mild aortic stenosis is present. Aortic valve mean gradient measures 16.0 mmHg. Aortic valve peak gradient measures 26.2 mmHg. Aortic valve area, by VTI measures 1.14 cm. Pulmonic Valve: The pulmonic valve was normal in structure. Pulmonic valve regurgitation is not visualized. No evidence of pulmonic stenosis. Aorta: The aortic root is  normal in size and structure. Venous: The inferior vena cava is normal in size with greater than 50% respiratory variability, suggesting right atrial pressure of 3 mmHg. IAS/Shunts: No atrial level shunt detected by color flow Doppler. Additional Comments: 3D was performed not requiring image post processing on an independent workstation and was normal.  LEFT VENTRICLE PLAX 2D LVIDd:         4.40 cm         Diastology LVIDs:         3.10 cm         LV e' medial:    6.53 cm/s LV PW:         1.20 cm         LV E/e' medial:  24.5 LV IVS:        1.10 cm         LV e' lateral:   9.14 cm/s LVOT diam:     1.70 cm         LV E/e' lateral: 17.5 LV SV:         76 LV SV Index:   43              2D Longitudinal LVOT Area:     2.27 cm        Strain                                2D Strain GLS   -17.0 %                                (A4C):  2D Strain GLS   -20.2 %                                (A3C):                                2D Strain GLS   -16.2 %                                (A2C):                                2D Strain GLS   -17.8 %                                Avg:                                 3D Volume EF                                LV 3D EF:    Left                                             ventricul                                             ar                                             ejection                                             fraction                                             by 3D                                             volume is                                             62 %.  3D Volume EF:                                3D EF:        62 %                                LV EDV:       123 ml                                LV ESV:       47 ml                                LV SV:        76 ml RIGHT VENTRICLE RV Basal diam:  2.90 cm RV S prime:     14.40 cm/s RVSP:           33.5 mmHg LEFT ATRIUM             Index         RIGHT ATRIUM           Index LA diam:        4.50 cm 2.51 cm/m   RA Pressure: 3.00 mmHg LA Vol (A2C):   50.5 ml 28.20 ml/m  RA Area:     12.60 cm LA Vol (A4C):   53.6 ml 29.93 ml/m  RA Volume:   29.70 ml  16.58 ml/m LA Biplane Vol: 52.3 ml 29.20 ml/m  AORTIC VALVE AV Area (Vmax):    1.13 cm AV Area (Vmean):   1.08 cm AV Area (VTI):     1.14 cm AV Vmax:           256.00 cm/s AV Vmean:          189.000 cm/s AV VTI:            0.669 m AV Peak Grad:      26.2 mmHg AV Mean Grad:      16.0 mmHg LVOT Vmax:         128.00 cm/s LVOT Vmean:        90.200 cm/s LVOT VTI:          0.336 m LVOT/AV VTI ratio: 0.50  AORTA Ao Root diam: 2.70 cm Ao Asc diam:  2.90 cm MITRAL VALVE                TRICUSPID VALVE MV Area (PHT):              TR Peak grad:   30.5 mmHg MV Decel Time:              TR Vmax:        276.00 cm/s MR Peak grad: 110.2 mmHg    Estimated RAP:  3.00 mmHg MR Mean grad: 79.0 mmHg     RVSP:           33.5 mmHg MR Vmax:      525.00 cm/s MR Vmean:     426.0 cm/s    SHUNTS MV E velocity: 160.00 cm/s  Systemic VTI:  0.34 m MV A velocity: 165.00 cm/s  Systemic Diam: 1.70 cm MV E/A ratio:  0.97 Morene Brownie Electronically signed by Morene Brownie Signature Date/Time:  05/13/2024/11:09:31 AM    Final     Assessment & Plan:  Essential hypertension -     EKG 12-Lead -     Basic metabolic panel with GFR; Future -     TSH; Future  LBBB (left bundle branch block) -     Ambulatory referral to Cardiology  Acquired hemolytic anemia (HCC) -     Lactate dehydrogenase; Future -     CBC with Differential/Platelet; Future -     Hepatic function panel; Future -     Haptoglobin; Future  Interstitial pulmonary disease (HCC)  Iron deficiency anemia due to chronic blood loss -     IBC + Ferritin; Future -     CBC with Differential/Platelet; Future  Kidney disease, chronic, stage IV (GFR 15-29 ml/min) (HCC) -     Basic metabolic panel with GFR; Future -     Uric acid; Future  Zinc  deficiency  Aortic  stenosis, mild -     Ambulatory referral to Cardiology     Follow-up: Return in about 3 months (around 03/14/2025).  Debby Molt, MD "

## 2024-12-15 LAB — LACTATE DEHYDROGENASE: LDH: 203 U/L (ref 120–250)

## 2024-12-15 LAB — HAPTOGLOBIN: Haptoglobin: <10 mg/dL — ABNORMAL LOW (ref 43–212)

## 2024-12-16 ENCOUNTER — Ambulatory Visit: Payer: Self-pay | Admitting: Internal Medicine

## 2024-12-17 NOTE — Assessment & Plan Note (Signed)
Renal function has improved 

## 2024-12-17 NOTE — Addendum Note (Signed)
 Addended by: JOSHUA DEBBY CROME on: 12/17/2024 10:39 AM   Modules accepted: Level of Service

## 2024-12-31 ENCOUNTER — Telehealth: Payer: Self-pay

## 2024-12-31 NOTE — Telephone Encounter (Unsigned)
 Copied from CRM #[id number]. Topic: General - Call Back - No Documentation >> Dec 31, 2024  1:25 PM Shereese L wrote: Reason for CRM: Patient returning office call. Jaz currently on lunch and patient would like a call back

## 2024-12-31 NOTE — Telephone Encounter (Signed)
 Unable to reach patient. LMTRC. Need to know why we are referring her to dermatology.

## 2024-12-31 NOTE — Telephone Encounter (Signed)
 Copied from CRM [id number]. Topic: Referral - Question >> Dec 31, 2024 11:31 AM Alfonso HERO wrote: Reason for RMF:[id number] calling asking for a referral to be sent for Dermatology....DR Norleen VEAR Hurst [phone]

## 2025-02-22 ENCOUNTER — Ambulatory Visit
